# Patient Record
Sex: Male | Born: 1950 | Race: White | Hispanic: No | Marital: Married | Smoking: Former smoker
Health system: Southern US, Community
[De-identification: ages and names within clinical notes are randomized; demographics above are authoritative.]

## PROBLEM LIST (undated history)

## (undated) DIAGNOSIS — M109 Gout, unspecified: Secondary | ICD-10-CM

## (undated) DIAGNOSIS — M1990 Unspecified osteoarthritis, unspecified site: Secondary | ICD-10-CM

## (undated) DIAGNOSIS — E782 Mixed hyperlipidemia: Secondary | ICD-10-CM

## (undated) DIAGNOSIS — Z860100 Personal history of colon polyps, unspecified: Secondary | ICD-10-CM

## (undated) DIAGNOSIS — E119 Type 2 diabetes mellitus without complications: Secondary | ICD-10-CM

## (undated) DIAGNOSIS — J189 Pneumonia, unspecified organism: Secondary | ICD-10-CM

## (undated) DIAGNOSIS — E669 Obesity, unspecified: Secondary | ICD-10-CM

## (undated) DIAGNOSIS — J449 Chronic obstructive pulmonary disease, unspecified: Secondary | ICD-10-CM

## (undated) DIAGNOSIS — Z8616 Personal history of COVID-19: Secondary | ICD-10-CM

## (undated) DIAGNOSIS — K8020 Calculus of gallbladder without cholecystitis without obstruction: Secondary | ICD-10-CM

## (undated) DIAGNOSIS — N401 Enlarged prostate with lower urinary tract symptoms: Secondary | ICD-10-CM

## (undated) DIAGNOSIS — E785 Hyperlipidemia, unspecified: Secondary | ICD-10-CM

## (undated) DIAGNOSIS — M1A9XX0 Chronic gout, unspecified, without tophus (tophi): Secondary | ICD-10-CM

## (undated) DIAGNOSIS — N471 Phimosis: Secondary | ICD-10-CM

## (undated) DIAGNOSIS — C801 Malignant (primary) neoplasm, unspecified: Secondary | ICD-10-CM

## (undated) DIAGNOSIS — I10 Essential (primary) hypertension: Secondary | ICD-10-CM

## (undated) HISTORY — DX: Obesity, unspecified: E66.9

## (undated) HISTORY — PX: CHOLECYSTECTOMY: SHX55

## (undated) HISTORY — DX: Hyperlipidemia, unspecified: E78.5

## (undated) HISTORY — DX: Calculus of gallbladder without cholecystitis without obstruction: K80.20

## (undated) HISTORY — DX: Pneumonia, unspecified organism: J18.9

## (undated) HISTORY — PX: LEFT HEART CATH: SHX5946

## (undated) HISTORY — DX: Unspecified osteoarthritis, unspecified site: M19.90

## (undated) HISTORY — PX: TONSILLECTOMY: SUR1361

---

## 2006-12-19 HISTORY — PX: LAPAROSCOPIC CHOLECYSTECTOMY: SUR755

## 2013-09-09 ENCOUNTER — Ambulatory Visit (INDEPENDENT_AMBULATORY_CARE_PROVIDER_SITE_OTHER): Payer: PRIVATE HEALTH INSURANCE | Admitting: Family Medicine

## 2013-09-09 ENCOUNTER — Ambulatory Visit (INDEPENDENT_AMBULATORY_CARE_PROVIDER_SITE_OTHER): Payer: PRIVATE HEALTH INSURANCE

## 2013-09-09 VITALS — BP 130/82 | HR 97 | Temp 98.2°F | Ht 70.0 in | Wt 211.0 lb

## 2013-09-09 DIAGNOSIS — R5381 Other malaise: Secondary | ICD-10-CM

## 2013-09-09 DIAGNOSIS — R5383 Other fatigue: Secondary | ICD-10-CM

## 2013-09-09 DIAGNOSIS — R0989 Other specified symptoms and signs involving the circulatory and respiratory systems: Secondary | ICD-10-CM

## 2013-09-09 DIAGNOSIS — R059 Cough, unspecified: Secondary | ICD-10-CM

## 2013-09-09 DIAGNOSIS — R05 Cough: Secondary | ICD-10-CM

## 2013-09-09 DIAGNOSIS — I10 Essential (primary) hypertension: Secondary | ICD-10-CM

## 2013-09-09 LAB — POCT CBC
Granulocyte percent: 65.8 %G (ref 37–80)
HCT, POC: 43.8 % (ref 43.5–53.7)
Hemoglobin: 15 g/dL (ref 14.1–18.1)
Lymph, poc: 3.3 (ref 0.6–3.4)
MCH, POC: 32.1 pg — AB (ref 27–31.2)
MCHC: 34.2 g/dL (ref 31.8–35.4)
MCV: 93.9 fL (ref 80–97)
MPV: 7.9 fL (ref 0–99.8)
POC Granulocyte: 6.8 (ref 2–6.9)
POC LYMPH PERCENT: 31.7 %L (ref 10–50)
Platelet Count, POC: 244 10*3/uL (ref 142–424)
RBC: 4.7 M/uL (ref 4.69–6.13)
RDW, POC: 12.4 %
WBC: 10.3 10*3/uL — AB (ref 4.6–10.2)

## 2013-09-09 MED ORDER — LOSARTAN POTASSIUM 100 MG PO TABS: 100.0000 mg | ORAL_TABLET | Freq: Every day | ORAL | Status: DC

## 2013-09-09 MED ORDER — LEVALBUTEROL HCL 1.25 MG/0.5ML IN NEBU: 1.2500 mg | INHALATION_SOLUTION | Freq: Once | RESPIRATORY_TRACT | Status: DC

## 2013-09-09 MED ORDER — LEVALBUTEROL HCL 0.63 MG/3ML IN NEBU
0.6300 mg | INHALATION_SOLUTION | Freq: Once | RESPIRATORY_TRACT | Status: DC
  Administered 2013-09-09: 0.63 mg via RESPIRATORY_TRACT

## 2013-09-09 MED ORDER — LEVOFLOXACIN 500 MG PO TABS: 500.0000 mg | ORAL_TABLET | Freq: Every day | ORAL | Status: DC

## 2013-09-09 MED ORDER — ALBUTEROL SULFATE HFA 108 (90 BASE) MCG/ACT IN AERS: 2.0000 | INHALATION_SPRAY | Freq: Four times a day (QID) | RESPIRATORY_TRACT | Status: DC | PRN

## 2013-09-09 MED ORDER — PREDNISONE 10 MG PO TABS: ORAL_TABLET | ORAL | Status: DC

## 2013-09-09 NOTE — Progress Notes (Signed)
  Subjective:    Patient ID: Juan Merritt, male    DOB: Dec 14, 1951, 62 y.o.   MRN: [id number]  HPI  This 62 y.o. male presents for evaluation of shortness of breath and persistent cough. He has hx of having pneumonia and is worried that it may have returned.  He is a 1 ppd smoker. He states he gets SOB walking to the mailbox.  Review of Systems    No chest pain, SOB, HA, dizziness, vision change, N/V, diarrhea, constipation, dysuria, urinary urgency or frequency, myalgias, arthralgias or rash.  Objective:   Physical Exam  Vital signs noted  Well developed well nourished male.  HEENT - Head atraumatic Normocephalic                Eyes - PERRLA, Conjuctiva - clear Sclera- Clear EOMI                Ears - EAC's Wnl TM's Wnl Gross Hearing WNL                Nose - Nares patent                 Throat - oropharanx wnl Respiratory - Lungs exp wheezes scattered bilateral Cardiac - RRR S1 and S2 without murmur GI - Abdomen soft Nontender and bowel sounds active x 4 Extremities - No edema. Neuro - Grossly intact.  CXR - Hyperinflation with no infiltrates  Prelimnary reading by Angeline Slim    Assessment & Plan:  Chest congestion - Plan: POCT CBC, DG Chest 2 View, levalbuterol (XOPENEX) nebulizer solution 1.25 mg, levofloxacin (LEVAQUIN) 500 MG tablet, predniSONE (DELTASONE) 10 MG tablet, albuterol (PROVENTIL HFA;VENTOLIN HFA) 108 (90 BASE) MCG/ACT inhaler, levalbuterol (XOPENEX) nebulizer solution 0.63 mg.     Cough - Plan: POCT CBC, DG Chest 2 View, levalbuterol (XOPENEX) nebulizer solution 1.25 mg, levofloxacin (LEVAQUIN) 500 MG tablet, predniSONE (DELTASONE) 10 MG tablet, levalbuterol (XOPENEX) nebulizer solution 0.63 mg.  Take cough med from home.  Bronchitis - Instructed to DC smoking. Discussed he may have COPD and needs to use albuterol MDI 2 puff q 4-6 hour prn and Tudorza one puff bid #2 samples given.  Rx steroid taper and follow up in 2 weeks for PFT or sooner if  needed.  Fatigue - Plan: POCT CBC, DG Chest 2 View, levalbuterol (XOPENEX) nebulizer solution 1.25 mg, predniSONE (DELTASONE) 10 MG tablet  Essential hypertension, benign - Plan: losartan (COZAAR) 100 MG tablet  Deatra Canter FNP

## 2013-09-09 NOTE — Patient Instructions (Signed)

## 2013-10-23 ENCOUNTER — Ambulatory Visit: Payer: Self-pay | Admitting: Family Medicine

## 2013-10-23 ENCOUNTER — Ambulatory Visit: Payer: Self-pay

## 2013-10-23 VITALS — BP 144/89 | HR 95 | Temp 96.8°F | Ht 70.0 in | Wt 232.0 lb

## 2013-10-23 DIAGNOSIS — M79662 Pain in left lower leg: Secondary | ICD-10-CM

## 2013-10-23 DIAGNOSIS — M25562 Pain in left knee: Secondary | ICD-10-CM

## 2013-10-23 DIAGNOSIS — M79609 Pain in unspecified limb: Secondary | ICD-10-CM

## 2013-10-23 DIAGNOSIS — M25569 Pain in unspecified knee: Secondary | ICD-10-CM

## 2013-10-23 LAB — POCT CBC
Granulocyte percent: 61.6 %G (ref 37–80)
HCT, POC: 44.1 % (ref 43.5–53.7)
Lymph, poc: 2.7 (ref 0.6–3.4)
MCHC: 33.9 g/dL (ref 31.8–35.4)
POC Granulocyte: 5.4 (ref 2–6.9)
Platelet Count, POC: 258 10*3/uL (ref 142–424)
RDW, POC: 12.8 %
WBC: 8.7 10*3/uL (ref 4.6–10.2)

## 2013-10-23 MED ORDER — PREDNISONE 50 MG PO TABS: ORAL_TABLET | ORAL | Status: DC

## 2013-10-23 NOTE — Progress Notes (Signed)
  Subjective:    Patient ID: Juan Merritt, male    DOB: 1951-06-09, 62 y.o.   MRN: [id number]  HPI Pt presents today with chief complaint of knee pain  Pt reports progressive onset of L sided knee pain over last 2-3 days.  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  Also 1 PPD smoker  Pain mainly in anterior knee, but has progressed to involve posterior knee with some secondary calf swelling.  No fevers or chills. No new sexual partners.  No prior hx/o gout per pt.    Review of Systems  All other systems reviewed and are negative.       Objective:   Physical Exam  Constitutional: He appears well-developed and well-nourished.  HENT:  Head: Normocephalic and atraumatic.  Eyes: Pupils are equal, round, and reactive to light.  Neck: Normal range of motion. Neck supple.  Cardiovascular: Normal rate and regular rhythm.   Pulmonary/Chest: Effort normal.  Abdominal: Soft.  Musculoskeletal:       Legs: Predominant anteromedial knee effusion with TTP  No erythema Decreased ROM 2/2 pain  + popliteal TTP  Calves assymetric.    Neurological: He is alert.  Skin: Skin is warm.   [facility] reading (PRIMARY) by  Dr. Alvester Morin  Preliminary read with no acute fracture or dislocation, ? Effusion anteriorly                                   Lab Results  Component Value Date   WBC 8.7 10/23/2013   HGB 15.0 10/23/2013   HCT 44.1 10/23/2013   MCV 95.3 10/23/2013          Assessment & Plan:  Knee pain, left - Plan: DG Knee 1-2 Views Left, POCT CBC, Uric acid, predniSONE (DELTASONE) 50 MG tablet  Calf pain, left - Plan: US Venous Img Lower Unilateral Left  DDx fairly broad,  including gout, mensical injury, baker's cyst, DVT, infection.  Initially attempted diagnostic and therapeutic knee aspiration.  Pt repeatedly refused.  No leukocytosis on CBC today. Afebrile. No marked erythema on exam.  Swelling is primarily in anterior knee compartment.  Wells Score of 2,  though clinical exam more consistent with intra-articular process.  Will send for stat LE ultrasound.  Check uric acid level.  WIll place on course of prednisone for acute treatment.  Discussed with pt at length to go to ER if sxs worsen.  Pt expressed understanding.  Follow up pending imaging and blood work.

## 2013-10-24 LAB — URIC ACID: Uric Acid: 7.8 mg/dL (ref 3.7–8.6)

## 2013-10-31 ENCOUNTER — Telehealth: Payer: Self-pay | Admitting: Family Medicine

## 2013-10-31 NOTE — Telephone Encounter (Signed)
Spoke with patient again and he will f/u in our office if his symptoms persist or worsen.

## 2013-10-31 NOTE — Telephone Encounter (Signed)
[place] to address and route to pool A

## 2013-10-31 NOTE — Telephone Encounter (Signed)
Discussed with Dr. Alvester Morin.  Patient's uric acid level was elevated above 6 but this alone does not indicate an acute gout flare. Based on the normal values provided by the lab his level was was within normal limits.  At visit patient was not interested in having his knee aspirated. He also no-showed for the lower extremity ultrasound that was ordered.  Dr. Alvester Morin would like for him to see an orthopedist at this point. His knee swelling has decreased but not completely resolved. The pain is not constant but comes and goes. He does not want to see an orthopedist and wants to be treated for gout.  I offered him a follow-up appointment and he stated that he couldn't come back in due to his work scheduled.

## 2013-11-02 ENCOUNTER — Emergency Department (HOSPITAL_COMMUNITY)
Admission: EM | Admit: 2013-11-02 | Discharge: 2013-11-02 | Disposition: A | Discharge status: Home / Self Care | Payer: PRIVATE HEALTH INSURANCE | Attending: Emergency Medicine | Admitting: Emergency Medicine

## 2013-11-02 ENCOUNTER — Encounter (HOSPITAL_COMMUNITY): Payer: Self-pay | Admitting: Emergency Medicine

## 2013-11-02 DIAGNOSIS — F17200 Nicotine dependence, unspecified, uncomplicated: Secondary | ICD-10-CM | POA: Insufficient documentation

## 2013-11-02 DIAGNOSIS — I10 Essential (primary) hypertension: Secondary | ICD-10-CM | POA: Insufficient documentation

## 2013-11-02 DIAGNOSIS — Z79899 Other long term (current) drug therapy: Secondary | ICD-10-CM | POA: Insufficient documentation

## 2013-11-02 DIAGNOSIS — M25469 Effusion, unspecified knee: Secondary | ICD-10-CM | POA: Insufficient documentation

## 2013-11-02 DIAGNOSIS — M25569 Pain in unspecified knee: Secondary | ICD-10-CM | POA: Insufficient documentation

## 2013-11-02 DIAGNOSIS — M25562 Pain in left knee: Secondary | ICD-10-CM

## 2013-11-02 HISTORY — DX: Essential (primary) hypertension: I10

## 2013-11-02 HISTORY — DX: Gout, unspecified: M10.9

## 2013-11-02 MED ORDER — INDOMETHACIN 50 MG PO CAPS: 50.0000 mg | ORAL_CAPSULE | Freq: Three times a day (TID) | ORAL | Status: DC | PRN

## 2013-11-02 NOTE — ED Notes (Signed)
Pt states he is having a gout flare up in L knee. States PCP gave him dose pack of prednisone which he completed with relief of swelling but "no one gave me any gout medication." Pt reports no pain at rest but when he walks or applies pressure to LLE he has severe pain.

## 2013-11-02 NOTE — ED Provider Notes (Signed)
Medical screening examination/treatment/procedure(s) were performed by non-physician practitioner and as supervising physician I was immediately available for consultation/collaboration.  EKG Interpretation   None         Anneth Brunell S Adin Laker, MD 11/02/13 2048 

## 2013-11-02 NOTE — ED Provider Notes (Signed)
CSN: [id number]     Arrival date & time 11/02/13  1122 History  This chart was scribed for non-physician practitioner Santiago Glad, PA-C working with Junius Argyle, MD by Leone Payor, ED Scribe. This patient was seen in room TR09C/TR09C and the patient's care was started at 1122.    Chief Complaint  Patient presents with  . Gout    The history is provided by the patient. No language interpreter was used.    HPI Comments: Juan Merritt is a 62 y.o. male who presents to the Emergency Department complaining of a new episode of left knee pain and swelling that began a couple weeks ago. Pt states he was seen by PCP 10 days ago who prescribed him a 5 day course of steroids. Pt states he finished the medication with mild improvement but he continues to have pain in the left knee. He states pain is worse when initially standing and bearing weight. He denies having a history of gout. Pt states his PCP reported elevated uric acid levels which is why he was diagnosed with gout. However, pt states he was not given any medication specifically for gout. He denies numbness or weakness of the left lower extremity.     Past Medical History  Diagnosis Date  . Hypertension   . Gout    Past Surgical History  Procedure Laterality Date  . Cholecystectomy    . Tonsillectomy     History reviewed. No pertinent family history. History  Substance Use Topics  . Smoking status: Current Every Day Smoker    Types: Cigarettes  . Smokeless tobacco: Not on file  . Alcohol Use: Yes    Review of Systems A complete 10 system review of systems was obtained and all systems are negative except as noted in the HPI and PMH.   Allergies  Review of patient's allergies indicates no known allergies.  Home Medications   Current Outpatient Rx  Name  Route  Sig  Dispense  Refill  . amLODipine (NORVASC) 10 MG tablet   Oral   Take 10 mg by mouth daily.         Marland Kitchen losartan (COZAAR) 100 MG tablet   Oral  Take 100 mg by mouth daily.          BP 132/83  Pulse 99  Temp(Src) 97.5 F (36.4 C) (Oral)  Resp 20  SpO2 99% Physical Exam  Nursing note and vitals reviewed. Constitutional: He is oriented to person, place, and time. He appears well-developed and well-nourished.  HENT:  Head: Normocephalic and atraumatic.  Mouth/Throat: Oropharynx is clear and moist.  Eyes: EOM are normal. Pupils are equal, round, and reactive to light.  Neck: Normal range of motion. Neck supple.  Cardiovascular: Normal rate, regular rhythm and normal heart sounds.   Pulses:      Dorsalis pedis pulses are 2+ on the right side, and 2+ on the left side.  Pulmonary/Chest: Effort normal and breath sounds normal. He has no wheezes.  Musculoskeletal: Normal range of motion.  Good ROM of the left knee but with pain. Mild swelling to the medial aspect of the left knee. No erythema or warmth.   Neurological: He is alert and oriented to person, place, and time. No sensory deficit.  Skin: Skin is warm and dry.  Psychiatric: He has a normal mood and affect. His behavior is normal.    ED Course  Procedures   DIAGNOSTIC STUDIES: Oxygen Saturation is 99% on RA, normal by my interpretation.  COORDINATION OF CARE: 12:26 PM Discussed treatment plan with pt at bedside and pt agreed to plan.   Labs Review Labs Reviewed - No data to display Imaging Review No results found.  EKG Interpretation   None       MDM  No diagnosis found. Patient presenting with pain of his left knee.  Patient afebrile.  Full ROM.  No evidence of septic joint.  Patient reports that he has been seen by his PCP for this same pain and was diagnosed with Gout.  Patient completed a course of steroids.  Patient discharged home with Indomethicin.  Patient instructed to follow up with PCP.  Return precautions given.  I personally performed the services described in this documentation, which was scribed in my presence. The recorded information  has been reviewed and is accurate.   Santiago Glad, PA-C 11/02/13 [id number]

## 2013-11-11 ENCOUNTER — Ambulatory Visit (INDEPENDENT_AMBULATORY_CARE_PROVIDER_SITE_OTHER): Payer: PRIVATE HEALTH INSURANCE | Admitting: Family Medicine

## 2013-11-11 ENCOUNTER — Encounter: Payer: Self-pay | Admitting: Family Medicine

## 2013-11-11 VITALS — BP 127/76 | HR 86 | Temp 97.2°F | Ht 70.0 in | Wt 235.0 lb

## 2013-11-11 DIAGNOSIS — M109 Gout, unspecified: Secondary | ICD-10-CM

## 2013-11-11 MED ORDER — ALLOPURINOL 100 MG PO TABS: 100.0000 mg | ORAL_TABLET | Freq: Every day | ORAL | Status: DC

## 2013-11-11 MED ORDER — INDOMETHACIN 50 MG PO CAPS: 50.0000 mg | ORAL_CAPSULE | Freq: Three times a day (TID) | ORAL | Status: DC | PRN

## 2013-11-11 NOTE — Progress Notes (Signed)
  Subjective:    Patient ID: Juan Merritt, male    DOB: Feb 16, 1951, 62 y.o.   MRN: [id number]  HPI This 62 y.o. male presents for evaluation of left knee pain and gout flare.  He has Hx of gout and presented about a week ago and was tx'd with prednisone which helped And then it returned so he went to [facility] ED and was rx'd indomethacin tid prn and it worked Well to [employer] the pain in his left knee.  He does admit to having a diet high in purines and  He has hx of hypertension.  He states he was told his uric acid was elevated.   Review of Systems C/o left knee pain.   No chest pain, SOB, HA, dizziness, vision change, N/V, diarrhea, constipation, dysuria, urinary urgency or frequency, myalgias, arthralgias or rash.  Objective:   Physical Exam  Vital signs noted  Well developed well nourished male.  HEENT - Head atraumatic Normocephalic                Eyes - PERRLA, Conjuctiva - clear Sclera- Clear EOMI                Ears - EAC's Wnl TM's Wnl Gross Hearing.                Throat - oropharanx wnl Respiratory - Lungs CTA bilateral Cardiac - RRR S1 and S2 without murmur MS - Swollen and tender left knee     Assessment & Plan:  Gout - Plan: indomethacin (INDOCIN) 50 MG capsule, allopurinol (ZYLOPRIM) 100 MG tablet Start allopurinol, eat low purine diet, take indocin tid for next 10 days and then back down to Bid and then follow up in one month for recheck of uric acid and cmp, cbc.  Deatra Canter FNP

## 2013-11-11 NOTE — Patient Instructions (Signed)

## 2013-12-16 ENCOUNTER — Encounter: Payer: Self-pay | Admitting: Family Medicine

## 2013-12-16 ENCOUNTER — Encounter (INDEPENDENT_AMBULATORY_CARE_PROVIDER_SITE_OTHER): Payer: Self-pay

## 2013-12-16 ENCOUNTER — Ambulatory Visit (INDEPENDENT_AMBULATORY_CARE_PROVIDER_SITE_OTHER): Payer: PRIVATE HEALTH INSURANCE | Admitting: Family Medicine

## 2013-12-16 VITALS — BP 138/85 | HR 90 | Temp 97.1°F | Ht 70.0 in | Wt 241.0 lb

## 2013-12-16 DIAGNOSIS — M109 Gout, unspecified: Secondary | ICD-10-CM

## 2013-12-16 NOTE — Patient Instructions (Signed)

## 2013-12-16 NOTE — Progress Notes (Signed)
   Subjective:    Patient ID: SUMMER PARTHASARATHY, male    DOB: 03-09-1951, 62 y.o.   MRN: [id number]  HPI This 62 y.o. male presents for evaluation of left ankle swelling and follow up on gout. He was tx'd with allopurinol and indocin and has been doing well but is having left ankle swelling.   Review of Systems No chest pain, SOB, HA, dizziness, vision change, N/V, diarrhea, constipation, dysuria, urinary urgency or frequency, myalgias, arthralgias or rash.     Objective:   Physical Exam Vital signs noted  Well developed well nourished male.  HEENT - Head atraumatic Normocephalic                Eyes - PERRLA, Conjuctiva - clear Sclera- Clear EOMI                Ears - EAC's Wnl TM's Wnl Gross Hearing WNL                Nose - Nares patent                 Throat - oropharanx wnl Respiratory - Lungs CTA bilateral Cardiac - RRR S1 and S2 without murmur GI - Abdomen soft Nontender and bowel sounds active x 4 Extremities - No edema. Neuro - Grossly intact. MS - Left ankle swollen and no tenderness.      Assessment & Plan:  Gout Continue current regimen and follow up in one months for uric acid.  Edema - Discussed is probably from gout attack in left knee causing dependant edema.  Deatra Canter FNP

## 2014-01-17 ENCOUNTER — Other Ambulatory Visit (INDEPENDENT_AMBULATORY_CARE_PROVIDER_SITE_OTHER): Payer: PRIVATE HEALTH INSURANCE

## 2014-01-17 DIAGNOSIS — M109 Gout, unspecified: Secondary | ICD-10-CM

## 2014-01-17 NOTE — Progress Notes (Signed)
Patient came in for labs only.

## 2014-01-18 LAB — URIC ACID: URIC ACID: 5.4 mg/dL (ref 3.7–8.6)

## 2014-01-18 LAB — SPECIMEN STATUS REPORT

## 2014-06-02 ENCOUNTER — Telehealth: Payer: Self-pay | Admitting: Family Medicine

## 2014-06-02 ENCOUNTER — Ambulatory Visit (INDEPENDENT_AMBULATORY_CARE_PROVIDER_SITE_OTHER): Payer: PRIVATE HEALTH INSURANCE

## 2014-06-02 ENCOUNTER — Encounter: Payer: Self-pay | Admitting: Family

## 2014-06-02 ENCOUNTER — Ambulatory Visit (INDEPENDENT_AMBULATORY_CARE_PROVIDER_SITE_OTHER): Payer: PRIVATE HEALTH INSURANCE | Admitting: Family

## 2014-06-02 VITALS — BP 126/80 | HR 102 | Temp 99.0°F | Ht 70.0 in | Wt 233.2 lb

## 2014-06-02 DIAGNOSIS — R059 Cough, unspecified: Secondary | ICD-10-CM

## 2014-06-02 DIAGNOSIS — R0602 Shortness of breath: Secondary | ICD-10-CM

## 2014-06-02 DIAGNOSIS — R05 Cough: Secondary | ICD-10-CM

## 2014-06-02 DIAGNOSIS — J209 Acute bronchitis, unspecified: Secondary | ICD-10-CM

## 2014-06-02 MED ORDER — ALBUTEROL SULFATE HFA 108 (90 BASE) MCG/ACT IN AERS: 2.0000 | INHALATION_SPRAY | Freq: Four times a day (QID) | RESPIRATORY_TRACT | Status: DC | PRN

## 2014-06-02 MED ORDER — GUAIFENESIN-DM 100-10 MG/5ML PO SYRP: 5.0000 mL | ORAL_SOLUTION | ORAL | Status: DC | PRN

## 2014-06-02 MED ORDER — AMOXICILLIN-POT CLAVULANATE 875-125 MG PO TABS: 1.0000 | ORAL_TABLET | Freq: Two times a day (BID) | ORAL | Status: DC

## 2014-06-02 NOTE — Patient Instructions (Addendum)
Acute Bronchitis Bronchitis is inflammation of the airways that extend from the windpipe into the lungs (bronchi). The inflammation often causes mucus to develop. This leads to a cough, which is the most common symptom of bronchitis.  In acute bronchitis, the condition usually develops suddenly and goes away over time, usually in a couple weeks. Smoking, allergies, and asthma can make bronchitis worse. Repeated episodes of bronchitis may cause further lung problems.  CAUSES Acute bronchitis is most often caused by the same virus that causes a cold. The virus can spread from person to person (contagious).  SIGNS AND SYMPTOMS   Cough.   Fever.   Coughing up mucus.   Body aches.   Chest congestion.   Chills.   Shortness of breath.   Sore throat.  DIAGNOSIS  Acute bronchitis is usually diagnosed through a physical exam. Tests, such as chest X-rays, are sometimes done to rule out other conditions.  TREATMENT  Acute bronchitis usually goes away in a couple weeks. Often times, no medical treatment is necessary. Medicines are sometimes given for relief of fever or cough. Antibiotics are usually not needed but may be prescribed in certain situations. In some cases, an inhaler may be recommended to help reduce shortness of breath and control the cough. A cool mist vaporizer may also be used to help thin bronchial secretions and make it easier to clear the chest.  HOME CARE INSTRUCTIONS  Get plenty of rest.   Drink enough fluids to keep your urine clear or pale yellow (unless you have a medical condition that requires fluid restriction). Increasing fluids may help thin your secretions and will prevent dehydration.   Only take over-the-counter or prescription medicines as directed by your health care provider.   Avoid smoking and secondhand smoke. Exposure to cigarette smoke or irritating chemicals will make bronchitis worse. If you are a smoker, consider using nicotine gum or skin  patches to help control withdrawal symptoms. Quitting smoking will help your lungs heal faster.   Reduce the chances of another bout of acute bronchitis by washing your hands frequently, avoiding people with cold symptoms, and trying not to touch your hands to your mouth, nose, or eyes.   Follow up with your health care provider as directed.  SEEK MEDICAL CARE IF: Your symptoms do not improve after 1 week of treatment.  SEEK IMMEDIATE MEDICAL CARE IF:  You develop an increased fever or chills.   You have chest pain.   You have severe shortness of breath.  You have bloody sputum.   You develop dehydration.  You develop fainting.  You develop repeated vomiting.  You develop a severe headache. MAKE SURE YOU:   Understand these instructions.  Will watch your condition.  Will get help right away if you are not doing well or get worse. Document Released: 01/12/2005 Document Revised: 08/07/2013 Document Reviewed: 05/28/2013 [facility] Patient Information 2014 [place], [place].  - Take meds as prescribed - Use a cool mist humidifier  -Use saline nose sprays frequently -Saline irrigations of the nose can be very helpful if done frequently.  * 4X daily for 1 week*  * Use of a nettie pot can be helpful with this. Follow directions with this* -Force fluids -For any cough or congestion  Use plain Mucinex- regular strength or max strength is fine   * Children- consult with Pharmacist for dosing -For fever or aces or pains- take tylenol or ibuprofen appropriate for age and weight.  * for fevers greater than 101 orally you  may alternate ibuprofen and tylenol every  3 hours. -Throat lozenges if help   Evelina Dun, FNP

## 2014-06-02 NOTE — Progress Notes (Signed)
Subjective:    Patient ID: Juan Merritt, male    DOB: [birth year], 63 y.o.   MRN: [id number]  Cough This is a new problem. The current episode started in the past 7 days. The problem has been gradually worsening. The problem occurs every few hours. The cough is productive of sputum. Associated symptoms include shortness of breath. Pertinent negatives include no ear congestion, nasal congestion, postnasal drip or wheezing. The symptoms are aggravated by lying down. Risk factors for lung disease include smoking/tobacco exposure. He has tried nothing for the symptoms. There is no history of asthma or COPD.      Review of Systems  HENT: Negative for postnasal drip.   Respiratory: Positive for cough and shortness of breath. Negative for wheezing.   All other systems reviewed and are negative.      Objective:   Physical Exam  Vitals reviewed. Constitutional: He is oriented to person, place, and time. He appears well-developed and well-nourished. No distress.  HENT:  Head: Normocephalic.  Right Ear: External ear normal.  Left Ear: External ear normal.  Mouth/Throat: Oropharynx is clear and moist.  Eyes: Pupils are equal, round, and reactive to light. Right eye exhibits no discharge. Left eye exhibits no discharge.  Neck: Normal range of motion. Neck supple. No thyromegaly present.  Cardiovascular: Normal rate, regular rhythm, normal heart sounds and intact distal pulses.   No murmur heard. Pulmonary/Chest: Effort normal. No respiratory distress. He has wheezes.  Abdominal: Soft. Bowel sounds are normal. He exhibits no distension. There is no tenderness.  Musculoskeletal: Normal range of motion. He exhibits no edema and no tenderness.  Neurological: He is alert and oriented to person, place, and time. He has normal reflexes. No cranial nerve deficit.  Skin: Skin is warm and dry. No rash noted. No erythema.  Psychiatric: He has a normal mood and affect. His behavior is normal. Judgment  and thought content normal.    BP 126/80  Pulse 102  Temp(Src) 99 F (37.2 C) (Oral)  Ht 5\' 10"  (1.778 m)  Wt 233 lb 3.2 oz (105.779 kg)  BMI 33.46 kg/m2  X-ray- Bronchial changes- Possible COPD Preliminary reading by Evelina Dun, FNP [facility]      Assessment & Plan:  1. SOB (shortness of breath) - DG Chest 2 View; Future - albuterol (PROVENTIL HFA;VENTOLIN HFA) 108 (90 BASE) MCG/ACT inhaler; Inhale 2 puffs into the lungs every 6 (six) hours as needed for wheezing or shortness of breath.  Dispense: 1 Inhaler; Refill: 2  2. Cough - guaiFENesin-dextromethorphan (ROBITUSSIN DM) 100-10 MG/5ML syrup; Take 5 mLs by mouth every 4 (four) hours as needed for cough.  Dispense: 118 mL; Refill: 0  3. Acute bronchitis - Take meds as prescribed - Use a cool mist humidifier  -Force fluids -For any cough or congestion  Use plain Mucinex- regular strength or max strength is fine -For fever or aces or pains- take tylenol or ibuprofen appropriate for age and weight.  * for fevers greater than 101 orally you may alternate ibuprofen and tylenol every  3 hours. -Throat lozenges if help - albuterol (PROVENTIL HFA;VENTOLIN HFA) 108 (90 BASE) MCG/ACT inhaler; Inhale 2 puffs into the lungs every 6 (six) hours as needed for wheezing or shortness of breath.  Dispense: 1 Inhaler; Refill: 2 - amoxicillin-clavulanate (AUGMENTIN) 875-125 MG per tablet; Take 1 tablet by mouth 2 (two) times daily.  Dispense: 14 tablet; Refill: 0 - guaiFENesin-dextromethorphan (ROBITUSSIN DM) 100-10 MG/5ML syrup; Take 5 mLs by mouth every 4 (  four) hours as needed for cough.  Dispense: 118 mL; Refill: 0

## 2014-06-03 ENCOUNTER — Other Ambulatory Visit: Payer: Self-pay | Admitting: Family

## 2014-06-16 ENCOUNTER — Encounter: Payer: Self-pay | Admitting: Family Medicine

## 2014-06-16 ENCOUNTER — Ambulatory Visit (INDEPENDENT_AMBULATORY_CARE_PROVIDER_SITE_OTHER): Payer: PRIVATE HEALTH INSURANCE | Admitting: Family Medicine

## 2014-06-16 VITALS — BP 123/74 | HR 92 | Temp 98.4°F | Wt 235.8 lb

## 2014-06-16 DIAGNOSIS — J209 Acute bronchitis, unspecified: Secondary | ICD-10-CM

## 2014-06-16 DIAGNOSIS — R059 Cough, unspecified: Secondary | ICD-10-CM

## 2014-06-16 DIAGNOSIS — R05 Cough: Secondary | ICD-10-CM

## 2014-06-16 DIAGNOSIS — R0989 Other specified symptoms and signs involving the circulatory and respiratory systems: Secondary | ICD-10-CM

## 2014-06-16 MED ORDER — AMOXICILLIN-POT CLAVULANATE 875-125 MG PO TABS: 1.0000 | ORAL_TABLET | Freq: Two times a day (BID) | ORAL | Status: DC

## 2014-06-16 MED ORDER — PREDNISONE 10 MG PO TABS: ORAL_TABLET | ORAL | Status: DC

## 2014-06-16 NOTE — Progress Notes (Signed)
   Subjective:    Patient ID: Juan Merritt, male    DOB: 10-06-1951, 63 y.o.   MRN: [id number]  HPI This 63 y.o. male presents for evaluation of bronchitis and COPD exacerbation.  He still has a cough But is feeling some better.   Review of Systems C/o cough No chest pain, SOB, HA, dizziness, vision change, N/V, diarrhea, constipation, dysuria, urinary urgency or frequency, myalgias, arthralgias or rash.     Objective:   Physical Exam Vital signs noted  Well developed well nourished male.  HEENT - Head atraumatic Normocephalic                Eyes - PERRLA, Conjuctiva - clear Sclera- Clear EOMI                Ears - EAC's Wnl TM's Wnl Gross Hearing WNL                Nose - Nares patent                 Throat - oropharanx wnl Respiratory - Lungs with expiratory wheezes Cardiac - RRR S1 and S2 without murmur GI - Abdomen soft Nontender and bowel sounds active x 4        Assessment & Plan:  Chest congestion - Plan: predniSONE (DELTASONE) 10 MG tablet  Cough - Plan: predniSONE (DELTASONE) 10 MG tablet  Acute bronchitis, unspecified organism - Plan: amoxicillin-clavulanate (AUGMENTIN) 875-125 MG per tablet  Push po fluids, rest, tylenol and motrin otc prn as directed for fever, arthralgias, and myalgias.  Follow up prn if sx's continue or persist.  Lysbeth Penner FNP

## 2014-10-07 ENCOUNTER — Ambulatory Visit (INDEPENDENT_AMBULATORY_CARE_PROVIDER_SITE_OTHER): Payer: PRIVATE HEALTH INSURANCE

## 2014-10-07 ENCOUNTER — Ambulatory Visit (INDEPENDENT_AMBULATORY_CARE_PROVIDER_SITE_OTHER): Payer: PRIVATE HEALTH INSURANCE | Admitting: Family Medicine

## 2014-10-07 ENCOUNTER — Encounter: Payer: Self-pay | Admitting: Family Medicine

## 2014-10-07 VITALS — BP 132/80 | HR 100 | Temp 97.5°F | Ht 70.0 in | Wt 236.0 lb

## 2014-10-07 DIAGNOSIS — R0602 Shortness of breath: Secondary | ICD-10-CM

## 2014-10-07 DIAGNOSIS — R0989 Other specified symptoms and signs involving the circulatory and respiratory systems: Secondary | ICD-10-CM

## 2014-10-07 DIAGNOSIS — R5383 Other fatigue: Secondary | ICD-10-CM

## 2014-10-07 DIAGNOSIS — J441 Chronic obstructive pulmonary disease with (acute) exacerbation: Secondary | ICD-10-CM

## 2014-10-07 DIAGNOSIS — R059 Cough, unspecified: Secondary | ICD-10-CM

## 2014-10-07 DIAGNOSIS — R05 Cough: Secondary | ICD-10-CM

## 2014-10-07 LAB — POCT CBC
Granulocyte percent: 74.6 %G (ref 37–80)
HCT, POC: 43.9 % (ref 43.5–53.7)
Hemoglobin: 14.4 g/dL (ref 14.1–18.1)
Lymph, poc: 2.1 (ref 0.6–3.4)
MCH, POC: 31.3 pg — AB (ref 27–31.2)
MCHC: 32.8 g/dL (ref 31.8–35.4)
MCV: 95.5 fL (ref 80–97)
MPV: 7.4 fL (ref 0–99.8)
POC Granulocyte: 7.2 — AB (ref 2–6.9)
POC LYMPH PERCENT: 21.6 %L (ref 10–50)
Platelet Count, POC: 224 10*3/uL (ref 142–424)
RBC: 4.6 M/uL — AB (ref 4.69–6.13)
RDW, POC: 13.1 %
WBC: 9.7 10*3/uL (ref 4.6–10.2)

## 2014-10-07 MED ORDER — LEVALBUTEROL HCL 1.25 MG/3ML IN NEBU
1.2500 mg | INHALATION_SOLUTION | Freq: Once | RESPIRATORY_TRACT | Status: AC
  Administered 2014-10-07: 1.25 mg via RESPIRATORY_TRACT

## 2014-10-07 MED ORDER — FLUTICASONE-SALMETEROL 250-50 MCG/DOSE IN AEPB: 1.0000 | INHALATION_SPRAY | Freq: Two times a day (BID) | RESPIRATORY_TRACT | Status: DC

## 2014-10-07 MED ORDER — METHYLPREDNISOLONE ACETATE 80 MG/ML IJ SUSP
80.0000 mg | Freq: Once | INTRAMUSCULAR | Status: AC
  Administered 2014-10-07: 80 mg via INTRAMUSCULAR

## 2014-10-07 MED ORDER — ALBUTEROL SULFATE (2.5 MG/3ML) 0.083% IN NEBU: 2.5000 mg | INHALATION_SOLUTION | Freq: Four times a day (QID) | RESPIRATORY_TRACT | Status: DC | PRN

## 2014-10-07 MED ORDER — PREDNISONE 10 MG PO TABS: ORAL_TABLET | ORAL | Status: DC

## 2014-10-07 MED ORDER — AMOXICILLIN 875 MG PO TABS: 875.0000 mg | ORAL_TABLET | Freq: Two times a day (BID) | ORAL | Status: DC

## 2014-10-07 NOTE — Progress Notes (Signed)
   Subjective:    Patient ID: Juan Merritt, male    DOB: 1951-05-12, 63 y.o.   MRN: [id number]  HPI This 63 y.o. male presents for evaluation of cough and SOB.  He has hx of long time tobacco abuse and he has hx of copd.  He is using SABA MDI and it is not helping.  He is having SOB walking across room and is having a lot of night time coughing.   Review of Systems C/o cough and uri sx's No chest pain, SOB, HA, dizziness, vision change, N/V, diarrhea, constipation, dysuria, urinary urgency or frequency, myalgias, arthralgias or rash.     Objective:   Physical Exam Vital signs noted  Well developed well nourished male.  HEENT - Head atraumatic Normocephalic                Eyes - PERRLA, Conjuctiva - clear Sclera- Clear EOMI                Ears - EAC's Wnl TM's Wnl Gross Hearing WNL                Nose - Nares patent                 Throat - oropharanx wnl Respiratory - Lungs with diminished breath sounds bilateral. Cardiac - RRR S1 and S2 without murmur GI - Abdomen soft Nontender and bowel sounds active x 4 Extremities - No edema. Neuro - Grossly intact.  CXR - No infiltrates     Assessment & Plan:  SOB (shortness of breath) - Plan: POCT CBC, DG Chest 2 View, levalbuterol (XOPENEX) nebulizer solution 1.25 mg  Cough - Plan: POCT CBC, DG Chest 2 View, predniSONE (DELTASONE) 10 MG tablet, levalbuterol (XOPENEX) nebulizer solution 1.25 mg  Chest congestion - Plan: POCT CBC, DG Chest 2 View, predniSONE (DELTASONE) 10 MG tablet, levalbuterol (XOPENEX) nebulizer solution 1.25 mg  Fatigue - Plan: levalbuterol (XOPENEX) nebulizer solution 1.25 mg  COPD exacerbation - Plan: levalbuterol (XOPENEX) nebulizer solution 1.25 mg, albuterol (PROVENTIL) (2.5 MG/3ML) 0.083% nebulizer solution, methylPREDNISolone acetate (DEPO-MEDROL) injection 80 mg  Tobacco abuse - Discussed that needs to quit smoking  Lysbeth Penner FNP

## 2014-10-10 ENCOUNTER — Telehealth: Payer: Self-pay | Admitting: Family Medicine

## 2014-10-10 NOTE — Telephone Encounter (Signed)
Appointment given for 10/30 with [facility]

## 2014-10-17 ENCOUNTER — Encounter: Payer: Self-pay | Admitting: Family Medicine

## 2014-10-17 ENCOUNTER — Ambulatory Visit (INDEPENDENT_AMBULATORY_CARE_PROVIDER_SITE_OTHER): Payer: PRIVATE HEALTH INSURANCE | Admitting: Family Medicine

## 2014-10-17 VITALS — BP 108/74 | HR 85 | Temp 97.5°F | Ht 70.0 in | Wt 234.0 lb

## 2014-10-17 DIAGNOSIS — J441 Chronic obstructive pulmonary disease with (acute) exacerbation: Secondary | ICD-10-CM

## 2014-10-17 NOTE — Progress Notes (Signed)
   Subjective:    Patient ID: Juan Merritt, male    DOB: 1951-02-06, 63 y.o.   MRN: [id number]  HPI Patient is here for follow up on copd exacerbation.  He has finished prednisone taper and is feeling a lot better.  He is sleeping better at night  Review of Systems  Constitutional: Negative for fever.  HENT: Negative for ear pain.   Eyes: Negative for discharge.  Respiratory: Negative for cough.   Cardiovascular: Negative for chest pain.  Gastrointestinal: Negative for abdominal distention.  Endocrine: Negative for polyuria.  Genitourinary: Negative for difficulty urinating.  Musculoskeletal: Negative for gait problem and neck pain.  Skin: Negative for color change and rash.  Neurological: Negative for speech difficulty and headaches.  Psychiatric/Behavioral: Negative for agitation.       Objective:    BP 108/74  Pulse 85  Temp(Src) 97.5 F (36.4 C) (Oral)  Ht 5\' 10"  (1.778 m)  Wt 234 lb (106.142 kg)  BMI 33.58 kg/m2 Physical Exam  Constitutional: He is oriented to person, place, and time. He appears well-developed and well-nourished.  HENT:  Head: Normocephalic and atraumatic.  Mouth/Throat: Oropharynx is clear and moist.  Eyes: Pupils are equal, round, and reactive to light.  Neck: Normal range of motion. Neck supple.  Cardiovascular: Normal rate and regular rhythm.   No murmur heard. Pulmonary/Chest: Effort normal and breath sounds normal.  Abdominal: Soft. Bowel sounds are normal. There is no tenderness.  Neurological: He is alert and oriented to person, place, and time.  Skin: Skin is warm and dry.  Psychiatric: He has a normal mood and affect.          Assessment & Plan:     ICD-9-CM ICD-10-CM  1. COPD exacerbation 491.21 J44.1  Resolved. Continue mucinex and follow up prn  Return if symptoms worsen or fail to improve.  Lysbeth Penner FNP

## 2014-11-26 ENCOUNTER — Other Ambulatory Visit: Payer: Self-pay | Admitting: Family Medicine

## 2014-12-06 ENCOUNTER — Other Ambulatory Visit: Payer: Self-pay | Admitting: Family Medicine

## 2014-12-17 ENCOUNTER — Other Ambulatory Visit: Payer: Self-pay | Admitting: Family

## 2014-12-30 ENCOUNTER — Other Ambulatory Visit: Payer: Self-pay | Admitting: Family Medicine

## 2015-01-21 ENCOUNTER — Ambulatory Visit (INDEPENDENT_AMBULATORY_CARE_PROVIDER_SITE_OTHER): Payer: PRIVATE HEALTH INSURANCE | Admitting: Family Medicine

## 2015-01-21 ENCOUNTER — Encounter: Payer: Self-pay | Admitting: Family Medicine

## 2015-01-21 VITALS — BP 118/83 | HR 92 | Temp 97.3°F | Ht 70.0 in | Wt 247.0 lb

## 2015-01-21 DIAGNOSIS — J206 Acute bronchitis due to rhinovirus: Secondary | ICD-10-CM

## 2015-01-21 DIAGNOSIS — R0989 Other specified symptoms and signs involving the circulatory and respiratory systems: Secondary | ICD-10-CM

## 2015-01-21 DIAGNOSIS — L989 Disorder of the skin and subcutaneous tissue, unspecified: Secondary | ICD-10-CM

## 2015-01-21 DIAGNOSIS — R059 Cough, unspecified: Secondary | ICD-10-CM

## 2015-01-21 DIAGNOSIS — R05 Cough: Secondary | ICD-10-CM

## 2015-01-21 MED ORDER — AMOXICILLIN 875 MG PO TABS: 875.0000 mg | ORAL_TABLET | Freq: Two times a day (BID) | ORAL | Status: DC

## 2015-01-21 MED ORDER — PREDNISONE 10 MG PO TABS: ORAL_TABLET | ORAL | Status: DC

## 2015-01-21 NOTE — Progress Notes (Signed)
   Subjective:    Patient ID: Juan Merritt, male    DOB: October 22, 1951, 64 y.o.   MRN: [id number]  HPI Patient is here for c/o skin lesion right cheek and URI c/o.  Review of Systems  Constitutional: Negative for fever.  HENT: Negative for ear pain.   Eyes: Negative for discharge.  Respiratory: Negative for cough.   Cardiovascular: Negative for chest pain.  Gastrointestinal: Negative for abdominal distention.  Endocrine: Negative for polyuria.  Genitourinary: Negative for difficulty urinating.  Musculoskeletal: Negative for gait problem and neck pain.  Skin: Negative for color change and rash.  Neurological: Negative for speech difficulty and headaches.  Psychiatric/Behavioral: Negative for agitation.       Objective:    BP 118/83 mmHg  Pulse 92  Temp(Src) 97.3 F (36.3 C) (Oral)  Ht 5\' 10"  (1.778 m)  Wt 247 lb (112.038 kg)  BMI 35.44 kg/m2 Physical Exam  Constitutional: He is oriented to person, place, and time. He appears well-developed and well-nourished.  HENT:  Head: Normocephalic and atraumatic.  Mouth/Throat: Oropharynx is clear and moist.  Eyes: Pupils are equal, round, and reactive to light.  Neck: Normal range of motion. Neck supple.  Cardiovascular: Normal rate and regular rhythm.   No murmur heard. Pulmonary/Chest: Effort normal and breath sounds normal.  Abdominal: Soft. Bowel sounds are normal. There is no tenderness.  Neurological: He is alert and oriented to person, place, and time.  Skin: Skin is warm and dry.  commedone right cheek with erythmatous border/ cyst w/o fluctuance  Psychiatric: He has a normal mood and affect.          Assessment & Plan:     ICD-9-CM ICD-10-CM   1. Chest congestion 786.9 R09.89 predniSONE (DELTASONE) 10 MG tablet     amoxicillin (AMOXIL) 875 MG tablet  2. Cough 786.2 R05 predniSONE (DELTASONE) 10 MG tablet     amoxicillin (AMOXIL) 875 MG tablet  3. Acute bronchitis due to Rhinovirus 466.0 J20.6 predniSONE  (DELTASONE) 10 MG tablet   079.3  amoxicillin (AMOXIL) 875 MG tablet  4. Skin lesion of face 709.9 L98.9 Ambulatory referral to Dermatology     No Follow-up on file.  Lysbeth Penner FNP

## 2015-02-02 ENCOUNTER — Other Ambulatory Visit: Payer: Self-pay | Admitting: Family Medicine

## 2015-02-09 ENCOUNTER — Other Ambulatory Visit: Payer: Self-pay | Admitting: Family Medicine

## 2015-03-04 ENCOUNTER — Telehealth: Payer: Self-pay | Admitting: Family Medicine

## 2015-03-04 NOTE — Telephone Encounter (Signed)
Stp's wife advised we don't have any openings Friday afternoon, advised to call Friday morning at 7:45 to see if we had any cancellations.

## 2015-03-06 ENCOUNTER — Encounter: Payer: Self-pay | Admitting: Physician Assistant

## 2015-03-06 ENCOUNTER — Ambulatory Visit (INDEPENDENT_AMBULATORY_CARE_PROVIDER_SITE_OTHER): Payer: PRIVATE HEALTH INSURANCE

## 2015-03-06 ENCOUNTER — Ambulatory Visit (INDEPENDENT_AMBULATORY_CARE_PROVIDER_SITE_OTHER): Payer: PRIVATE HEALTH INSURANCE | Admitting: Physician Assistant

## 2015-03-06 VITALS — BP 125/77 | HR 97 | Temp 97.8°F | Ht 70.0 in | Wt 248.0 lb

## 2015-03-06 DIAGNOSIS — R0602 Shortness of breath: Secondary | ICD-10-CM

## 2015-03-06 DIAGNOSIS — J441 Chronic obstructive pulmonary disease with (acute) exacerbation: Secondary | ICD-10-CM | POA: Diagnosis not present

## 2015-03-06 MED ORDER — IPRATROPIUM-ALBUTEROL 0.5-2.5 (3) MG/3ML IN SOLN
3.0000 mL | Freq: Once | RESPIRATORY_TRACT | Status: AC
  Administered 2015-03-06: 3 mL via RESPIRATORY_TRACT

## 2015-03-06 MED ORDER — PREDNISONE (PAK) 10 MG PO TABS: ORAL_TABLET | ORAL | Status: DC

## 2015-03-06 MED ORDER — AZITHROMYCIN 250 MG PO TABS: ORAL_TABLET | ORAL | Status: DC

## 2015-03-06 NOTE — Progress Notes (Signed)
   Subjective:    Patient ID: Juan Merritt, male    DOB: [birth year], 64 y.o.   MRN: [id number]  HPI 64 y/o obese male with comorbid COPD presents with c/o SOB and cough x several days. Has not stopped smoking, however states that he has decreased the amount he smokes daily and is using an electric cigarette. Has been using his nebulizer in the morning and Proventil BID with no relief.     Review of Systems  Constitutional: Negative.   HENT: Negative.   Respiratory: Positive for cough, shortness of breath and wheezing.   Cardiovascular: Negative.        Objective:   Physical Exam  Cardiovascular: Normal rate.  Exam reveals no gallop and no friction rub.   No murmur heard. Occasional PAC heard on PE and verified on ECG performed in clinic  Pulmonary/Chest: Effort normal. No respiratory distress. He has wheezes (expiratory, bilateral upper and lower lobes). He has no rales. He exhibits no tenderness.  Musculoskeletal: He exhibits no edema.          Assessment & Plan:  1. COPD exacerbation  -Nebulizer treatment given during visit. O2 saturation did not increase from 90%, however patient was not compliant with treatment and removed the adapter from his mouth several times.   -Sterapred Uni-pack as directed   - Azithromycin dose pack as directed  - increase Proventil to every 4 hours as directed  - continue to use nebulizer in the morning  - Stop smoking  -Go to ED immediately if s/s worsen over the weekend.   - Referral to Pulmonologist due to frequent exacerbations

## 2015-03-17 ENCOUNTER — Telehealth: Payer: Self-pay | Admitting: Physician Assistant

## 2015-03-17 ENCOUNTER — Other Ambulatory Visit: Payer: Self-pay | Admitting: Physician Assistant

## 2015-03-17 DIAGNOSIS — R0602 Shortness of breath: Secondary | ICD-10-CM

## 2015-03-17 DIAGNOSIS — J441 Chronic obstructive pulmonary disease with (acute) exacerbation: Secondary | ICD-10-CM

## 2015-03-17 MED ORDER — AZITHROMYCIN 250 MG PO TABS: ORAL_TABLET | ORAL | Status: DC

## 2015-03-17 NOTE — Telephone Encounter (Signed)
RX sent to pharm

## 2015-03-17 NOTE — Telephone Encounter (Signed)
RX sent

## 2015-03-23 ENCOUNTER — Ambulatory Visit (INDEPENDENT_AMBULATORY_CARE_PROVIDER_SITE_OTHER): Payer: PRIVATE HEALTH INSURANCE

## 2015-03-23 ENCOUNTER — Ambulatory Visit (INDEPENDENT_AMBULATORY_CARE_PROVIDER_SITE_OTHER): Payer: PRIVATE HEALTH INSURANCE | Admitting: Family Medicine

## 2015-03-23 ENCOUNTER — Encounter: Payer: Self-pay | Admitting: Family Medicine

## 2015-03-23 ENCOUNTER — Inpatient Hospital Stay (HOSPITAL_COMMUNITY)
Admission: AD | Admit: 2015-03-23 | Discharge: 2015-03-26 | DRG: 190 | Disposition: A | Discharge status: Home / Self Care | Payer: PRIVATE HEALTH INSURANCE | Source: Ambulatory Visit | Attending: Internal Medicine | Admitting: Internal Medicine

## 2015-03-23 ENCOUNTER — Encounter (HOSPITAL_COMMUNITY): Payer: Self-pay | Admitting: Internal Medicine

## 2015-03-23 VITALS — BP 155/92 | HR 87 | Temp 97.1°F | Ht 70.0 in | Wt 246.0 lb

## 2015-03-23 DIAGNOSIS — R0600 Dyspnea, unspecified: Secondary | ICD-10-CM | POA: Diagnosis present

## 2015-03-23 DIAGNOSIS — R059 Cough, unspecified: Secondary | ICD-10-CM

## 2015-03-23 DIAGNOSIS — M109 Gout, unspecified: Secondary | ICD-10-CM | POA: Diagnosis present

## 2015-03-23 DIAGNOSIS — R05 Cough: Secondary | ICD-10-CM

## 2015-03-23 DIAGNOSIS — J189 Pneumonia, unspecified organism: Secondary | ICD-10-CM | POA: Diagnosis not present

## 2015-03-23 DIAGNOSIS — R600 Localized edema: Secondary | ICD-10-CM | POA: Diagnosis present

## 2015-03-23 DIAGNOSIS — I10 Essential (primary) hypertension: Secondary | ICD-10-CM | POA: Diagnosis present

## 2015-03-23 DIAGNOSIS — E785 Hyperlipidemia, unspecified: Secondary | ICD-10-CM | POA: Diagnosis present

## 2015-03-23 DIAGNOSIS — J9601 Acute respiratory failure with hypoxia: Secondary | ICD-10-CM | POA: Diagnosis present

## 2015-03-23 DIAGNOSIS — Z720 Tobacco use: Secondary | ICD-10-CM | POA: Diagnosis not present

## 2015-03-23 DIAGNOSIS — F17210 Nicotine dependence, cigarettes, uncomplicated: Secondary | ICD-10-CM | POA: Diagnosis present

## 2015-03-23 DIAGNOSIS — J9600 Acute respiratory failure, unspecified whether with hypoxia or hypercapnia: Secondary | ICD-10-CM | POA: Diagnosis present

## 2015-03-23 DIAGNOSIS — J441 Chronic obstructive pulmonary disease with (acute) exacerbation: Principal | ICD-10-CM | POA: Diagnosis present

## 2015-03-23 DIAGNOSIS — R0602 Shortness of breath: Secondary | ICD-10-CM

## 2015-03-23 HISTORY — DX: Chronic obstructive pulmonary disease, unspecified: J44.9

## 2015-03-23 LAB — COMPREHENSIVE METABOLIC PANEL
ALK PHOS: 50 U/L (ref 39–117)
ALT: 43 U/L (ref 0–53)
AST: 26 U/L (ref 0–37)
Albumin: 4.3 g/dL (ref 3.5–5.2)
Anion gap: 9 (ref 5–15)
BUN: 15 mg/dL (ref 6–23)
CO2: 28 mmol/L (ref 19–32)
Calcium: 9.5 mg/dL (ref 8.4–10.5)
Chloride: 105 mmol/L (ref 96–112)
Creatinine, Ser: 0.88 mg/dL (ref 0.50–1.35)
GFR calc Af Amer: >90 mL/min (ref >90)
Glucose, Bld: 105 mg/dL — ABNORMAL HIGH (ref 70–99)
POTASSIUM: 4.5 mmol/L (ref 3.5–5.1)
Sodium: 142 mmol/L (ref 135–145)
Total Bilirubin: 0.5 mg/dL (ref 0.3–1.2)
Total Protein: 7.6 g/dL (ref 6.0–8.3)

## 2015-03-23 LAB — CBC WITH DIFFERENTIAL/PLATELET
BASOS PCT: 1 % (ref 0–1)
Basophils Absolute: 0 10*3/uL (ref 0.0–0.1)
Eosinophils Absolute: 1 10*3/uL — ABNORMAL HIGH (ref 0.0–0.7)
Eosinophils Relative: 13 % — ABNORMAL HIGH (ref 0–5)
HCT: 45.3 % (ref 39.0–52.0)
HEMOGLOBIN: 14.9 g/dL (ref 13.0–17.0)
Lymphocytes Relative: 31 % (ref 12–46)
Lymphs Abs: 2.5 10*3/uL (ref 0.7–4.0)
MCH: 32.4 pg (ref 26.0–34.0)
MCHC: 32.9 g/dL (ref 30.0–36.0)
MCV: 98.5 fL (ref 78.0–100.0)
Monocytes Absolute: 0.7 10*3/uL (ref 0.1–1.0)
Monocytes Relative: 9 % (ref 3–12)
NEUTROS ABS: 3.8 10*3/uL (ref 1.7–7.7)
NEUTROS PCT: 46 % (ref 43–77)
Platelets: 220 10*3/uL (ref 150–400)
RBC: 4.6 MIL/uL (ref 4.22–5.81)
RDW: 12.9 % (ref 11.5–15.5)
WBC: 7.9 10*3/uL (ref 4.0–10.5)

## 2015-03-23 LAB — TSH: TSH: 1.542 u[IU]/mL (ref 0.350–4.500)

## 2015-03-23 LAB — D-DIMER, QUANTITATIVE: D-Dimer, Quant: 0.31 ug/mL-FEU (ref 0.00–0.48)

## 2015-03-23 LAB — TROPONIN I: Troponin I: <0.03 ng/mL (ref <0.031)

## 2015-03-23 MED ORDER — SODIUM CHLORIDE 0.9 % IV SOLN
INTRAVENOUS | Status: AC
  Administered 2015-03-23: 23:00:00 via INTRAVENOUS

## 2015-03-23 MED ORDER — LEVOFLOXACIN IN D5W 500 MG/100ML IV SOLN
500.0000 mg | INTRAVENOUS | Status: DC
  Administered 2015-03-23 – 2015-03-25 (×3): 500 mg via INTRAVENOUS
  Filled 2015-03-23 (×3): qty 100

## 2015-03-23 MED ORDER — TIOTROPIUM BROMIDE MONOHYDRATE 18 MCG IN CAPS
18.0000 ug | ORAL_CAPSULE | Freq: Every day | RESPIRATORY_TRACT | Status: DC
  Administered 2015-03-24 – 2015-03-25 (×2): 18 ug via RESPIRATORY_TRACT
  Filled 2015-03-23: qty 5

## 2015-03-23 MED ORDER — ENOXAPARIN SODIUM 60 MG/0.6ML ~~LOC~~ SOLN
60.0000 mg | SUBCUTANEOUS | Status: DC
  Filled 2015-03-23 (×4): qty 0.6

## 2015-03-23 MED ORDER — METHYLPREDNISOLONE SODIUM SUCC 125 MG IJ SOLR
80.0000 mg | Freq: Three times a day (TID) | INTRAMUSCULAR | Status: DC
  Administered 2015-03-23 – 2015-03-26 (×8): 80 mg via INTRAVENOUS
  Filled 2015-03-23 (×12): qty 1.28

## 2015-03-23 MED ORDER — ACETAMINOPHEN 325 MG PO TABS: 650.0000 mg | ORAL_TABLET | Freq: Four times a day (QID) | ORAL | Status: DC | PRN

## 2015-03-23 MED ORDER — SODIUM CHLORIDE 0.9 % IJ SOLN
3.0000 mL | Freq: Two times a day (BID) | INTRAMUSCULAR | Status: DC
  Administered 2015-03-24 – 2015-03-25 (×3): 3 mL via INTRAVENOUS

## 2015-03-23 MED ORDER — AMLODIPINE BESYLATE 10 MG PO TABS: 10.0000 mg | ORAL_TABLET | Freq: Every day | ORAL | Status: DC

## 2015-03-23 MED ORDER — ALBUTEROL SULFATE (2.5 MG/3ML) 0.083% IN NEBU
2.5000 mg | INHALATION_SOLUTION | Freq: Four times a day (QID) | RESPIRATORY_TRACT | Status: DC
  Administered 2015-03-23 – 2015-03-24 (×2): 2.5 mg via RESPIRATORY_TRACT
  Filled 2015-03-23 (×2): qty 3

## 2015-03-23 MED ORDER — NICOTINE 21 MG/24HR TD PT24
21.0000 mg | MEDICATED_PATCH | Freq: Every day | TRANSDERMAL | Status: DC
  Administered 2015-03-23 – 2015-03-26 (×4): 21 mg via TRANSDERMAL
  Filled 2015-03-23 (×4): qty 1

## 2015-03-23 MED ORDER — ACETAMINOPHEN 650 MG RE SUPP: 650.0000 mg | Freq: Four times a day (QID) | RECTAL | Status: DC | PRN

## 2015-03-23 MED ORDER — LOSARTAN POTASSIUM 100 MG PO TABS
100.0000 mg | ORAL_TABLET | Freq: Every day | ORAL | Status: DC
Start: 2015-03-23 — End: 2015-09-07

## 2015-03-23 MED ORDER — ALBUTEROL SULFATE (2.5 MG/3ML) 0.083% IN NEBU
2.5000 mg | INHALATION_SOLUTION | Freq: Four times a day (QID) | RESPIRATORY_TRACT | Status: DC | PRN
  Administered 2015-03-23: 2.5 mg via RESPIRATORY_TRACT
  Filled 2015-03-23: qty 3

## 2015-03-23 NOTE — Patient Instructions (Signed)
Because of the persistent congestion and a drop in his PCO2 despite taking antibiotics using an albuterol nebulizer, taking prednisone, we will arrange for him to have an admission and hopefully they can follow up with the pulmonologist that a visit was plan for this Friday.

## 2015-03-23 NOTE — H&P (Signed)
Juan Merritt is an 64 y.o. male.    [facility] (pcp)  Chief Complaint: dyspnea HPI: 64 yo male with hx of tobacco use, copd, apparently c/o dyspnea for the past few days.  Slight dry cough,  Denies fever, chills, cp, palp, n/v, diarrhea, brbpr, black stool. Pt was sent from [facility].  o2 sat 87% in office.  CXR Copd changes.  Pt will be admitted for Copd exacerbation.   Past Medical History  Diagnosis Date  . Hypertension   . Gout   . COPD (chronic obstructive pulmonary disease)     not on home o2    Past Surgical History  Procedure Laterality Date  . Cholecystectomy    . Tonsillectomy      Family History  Problem Relation Age of Onset  . COPD Mother   . Diabetes Father    Social History:  reports that he has been smoking Cigarettes.  He has a 20 pack-year smoking history. He does not have any smokeless tobacco history on file. He reports that he drinks about 6.0 oz of alcohol per week. He reports that he does not use illicit drugs.  Allergies: No Known Allergies  Medications Prior to Admission  Medication Sig Dispense Refill  . albuterol (PROVENTIL) (2.5 MG/3ML) 0.083% nebulizer solution Take 3 mLs (2.5 mg total) by nebulization every 6 (six) hours as needed for wheezing or shortness of breath. 150 mL 1  . allopurinol (ZYLOPRIM) 100 MG tablet TAKE 1 TABLET (100 MG TOTAL) BY MOUTH DAILY. 30 tablet 2  . amLODipine (NORVASC) 10 MG tablet Take 1 tablet (10 mg total) by mouth daily. 90 tablet 1  . Fluticasone-Salmeterol (ADVAIR DISKUS) 250-50 MCG/DOSE AEPB Inhale 1 puff into the lungs 2 (two) times daily. 60 each 11  . indomethacin (INDOCIN) 50 MG capsule Take 1 capsule (50 mg total) by mouth 3 (three) times daily as needed. 90 capsule 3  . losartan (COZAAR) 100 MG tablet Take 1 tablet (100 mg total) by mouth daily. 90 tablet 1  . PROVENTIL HFA 108 (90 BASE) MCG/ACT inhaler INHALE 2 PUFFS INTO THE LUNGS EVERY 6 (SIX) HOURS AS NEEDED FOR WHEEZING OR SHORTNESS  OF BREATH. 6.7 each 2    No results found for this or any previous visit (from the past 48 hour(s)). Dg Chest 2 View  03/23/2015   CLINICAL DATA:  Cough, congestion, and shortness of breath, current everyday smoker.  EXAM: CHEST  2 VIEW  COMPARISON:  PA and lateral chest x-ray of March 06, 2015  FINDINGS: The lungs are hyperinflated with hemidiaphragm flattening. The heart and pulmonary vascularity are normal. The mediastinum is normal in width. There is no pleural effusion. The bony thorax is unremarkable.  IMPRESSION: COPD.  There is no active cardiopulmonary disease.   Electronically Signed   By: David  Martinique   On: 03/23/2015 15:36    Review of Systems  Constitutional: Negative.   HENT: Negative.   Eyes: Negative.   Respiratory: Positive for cough, shortness of breath and wheezing. Negative for hemoptysis and sputum production.   Cardiovascular: Negative.   Gastrointestinal: Negative.   Genitourinary: Negative.   Musculoskeletal: Negative.   Skin: Negative.   Neurological: Negative.   Endo/Heme/Allergies: Negative.   Psychiatric/Behavioral: Negative.     Blood pressure 115/80, pulse 106, temperature 98.4 F (36.9 C), temperature source Oral, resp. rate 22, SpO2 90 %. Physical Exam  Constitutional: He is oriented to person, place, and time. He appears well-developed and well-nourished.  HENT:  Head: Normocephalic and atraumatic.  Mouth/Throat: No oropharyngeal exudate.  Eyes: Conjunctivae and EOM are normal. Pupils are equal, round, and reactive to light. No scleral icterus.  Neck: Normal range of motion. Neck supple. No JVD present. No tracheal deviation present. No thyromegaly present.  Cardiovascular: Normal rate and regular rhythm.  Exam reveals no gallop and no friction rub.   No murmur heard. Respiratory: Effort normal. He has wheezes. He has no rales. He exhibits no tenderness.  GI: Soft. Bowel sounds are normal. He exhibits no distension. There is no tenderness. There is  no rebound and no guarding.  Musculoskeletal: Normal range of motion. He exhibits no edema or tenderness.  Lymphadenopathy:    He has no cervical adenopathy.  Neurological: He is alert and oriented to person, place, and time. He has normal reflexes. He displays normal reflexes. No cranial nerve deficit. He exhibits normal muscle tone. Coordination normal.  Skin: Skin is warm and dry. No rash noted. No erythema. No pallor.  Psychiatric: He has a normal mood and affect. His behavior is normal. Judgment and thought content normal.     Assessment/Plan Dyspnea  likely secondary to copd exacerbation Solumedrol 80mg  iv q8h levaquin 500mg  iv qday spiriva 1puff qday Albuterol 0.083% 1 neb po q6h and q6h prn  Tachycardia Check trop i q6hx 3 Check tsh Check d dimer  If d dimer is positive CTA chest if creatinine wnl Consider echo if persistent  Hypertension Cont to monitor   DVT prophylaxis  Scd, and lovenox Cherelle Midkiff 03/23/2015, 8:30 PM

## 2015-03-23 NOTE — Progress Notes (Signed)
Subjective:    Patient ID: Juan Merritt, male    DOB: 1951/03/28, 64 y.o.   MRN: [id number]  HPI Patient here today for cough, congestion, COPD, and SOB. He is accompanied today by his wife. He has already completed a Zpak and this started about 1.5 weeks ago. The patient may have gotten a little better with a Z-Pak and a prednisone that has gotten much worse since that time. He's been using the albuterol nebulizer and inhaler at home. He did take a course of prednisone.       There are no active problems to display for this patient.  Outpatient Encounter Prescriptions as of 03/23/2015  Medication Sig  . albuterol (PROVENTIL) (2.5 MG/3ML) 0.083% nebulizer solution Take 3 mLs (2.5 mg total) by nebulization every 6 (six) hours as needed for wheezing or shortness of breath.  . allopurinol (ZYLOPRIM) 100 MG tablet TAKE 1 TABLET (100 MG TOTAL) BY MOUTH DAILY.  Marland Kitchen amLODipine (NORVASC) 10 MG tablet Take 1 tablet (10 mg total) by mouth daily.  . Fluticasone-Salmeterol (ADVAIR DISKUS) 250-50 MCG/DOSE AEPB Inhale 1 puff into the lungs 2 (two) times daily.  . indomethacin (INDOCIN) 50 MG capsule Take 1 capsule (50 mg total) by mouth 3 (three) times daily as needed.  Marland Kitchen losartan (COZAAR) 100 MG tablet Take 1 tablet (100 mg total) by mouth daily.  Marland Kitchen PROVENTIL HFA 108 (90 BASE) MCG/ACT inhaler INHALE 2 PUFFS INTO THE LUNGS EVERY 6 (SIX) HOURS AS NEEDED FOR WHEEZING OR SHORTNESS OF BREATH.  . [DISCONTINUED] amLODipine (NORVASC) 10 MG tablet TAKE 1 TABLET BY MOUTH DAILY  . [DISCONTINUED] losartan (COZAAR) 100 MG tablet TAKE 1 TABLET BY MOUTH DAILY  . [DISCONTINUED] predniSONE (STERAPRED UNI-PAK) 10 MG tablet Take as directed.  . [DISCONTINUED] allopurinol (ZYLOPRIM) 100 MG tablet TAKE 1 TABLET (100 MG TOTAL) BY MOUTH DAILY.  . [DISCONTINUED] azithromycin (ZITHROMAX) 250 MG tablet Take 2 tablets on day 1 , then 1 tab on days 2-5     Review of Systems  Constitutional: Negative.   HENT: Positive for  congestion.   Eyes: Negative.   Respiratory: Positive for cough, shortness of breath and wheezing.   Cardiovascular: Negative.   Gastrointestinal: Negative.   Endocrine: Negative.   Genitourinary: Negative.   Musculoskeletal: Negative.   Skin: Negative.   Allergic/Immunologic: Negative.   Neurological: Negative.   Hematological: Negative.   Psychiatric/Behavioral: Negative.        Objective:   Physical Exam  Constitutional: He is oriented to person, place, and time. He appears well-developed and well-nourished. He appears distressed.  HENT:  Head: Normocephalic and atraumatic.  Right Ear: External ear normal.  Left Ear: External ear normal.  Nose: Nose normal.  Mouth/Throat: Oropharynx is clear and moist. No oropharyngeal exudate.  Eyes: Conjunctivae and EOM are normal. Pupils are equal, round, and reactive to light. Right eye exhibits no discharge. Left eye exhibits no discharge.  Neck: Normal range of motion. Neck supple. No thyromegaly present.  Cardiovascular: Normal rate, regular rhythm and normal heart sounds.   No murmur heard. Pulmonary/Chest: Effort normal. He has wheezes. He has rales.  The patient has rales and rhonchi bilaterally with a very tight and congested cough  Musculoskeletal: Normal range of motion. He exhibits edema.  Lymphadenopathy:    He has cervical adenopathy.  Neurological: He is alert and oriented to person, place, and time.  Skin: Skin is warm and dry. No rash noted.  Psychiatric: He has a normal mood and affect. His behavior  is normal. Judgment and thought content normal.  Vitals reviewed.  BP 155/92 mmHg  Pulse 87  Temp(Src) 97.1 F (36.2 C) (Oral)  Ht '5\' 10"'  (1.778 m)  Wt 246 lb (111.585 kg)  BMI 35.30 kg/m2  SpO2 87%  [facility] reading (PRIMARY) by  Dr. Wonda Horner acute disease process apparent  Pulse ox 87%                                       Assessment & Plan:  1. SOB (shortness of breath) -Pulse ox is down to 87% today despite  using albuterol nebulizer and completing a course of antibiotics and prednisone. - BMP8+EGFR - CBC - DG Chest 2 View; Future  2. Cough -The patient continues to have cough and shortness of breath - BMP8+EGFR - CBC - DG Chest 2 View; Future  3. Pneumonia, unspecified laterality, unspecified part of lung -Because of his persistent cough and congestion and shortness of breath and we will arrange an admission through the hospitalist at [facility]. The patient does have an appointment with the pulmonologist this Friday.  Patient Instructions  Because of the persistent congestion and a drop in his PCO2 despite taking antibiotics using an albuterol nebulizer, taking prednisone, we will arrange for him to have an admission and hopefully they can follow up with the pulmonologist that a visit was plan for this Friday.   Arrie Senate MD

## 2015-03-24 DIAGNOSIS — J9601 Acute respiratory failure with hypoxia: Secondary | ICD-10-CM

## 2015-03-24 DIAGNOSIS — J9600 Acute respiratory failure, unspecified whether with hypoxia or hypercapnia: Secondary | ICD-10-CM | POA: Diagnosis present

## 2015-03-24 DIAGNOSIS — Z720 Tobacco use: Secondary | ICD-10-CM

## 2015-03-24 DIAGNOSIS — F17210 Nicotine dependence, cigarettes, uncomplicated: Secondary | ICD-10-CM | POA: Diagnosis present

## 2015-03-24 LAB — BMP8+EGFR
BUN/Creatinine Ratio: 16 (ref 10–22)
BUN: 15 mg/dL (ref 8–27)
CALCIUM: 9.2 mg/dL (ref 8.6–10.2)
CHLORIDE: 104 mmol/L (ref 97–108)
CO2: 27 mmol/L (ref 18–29)
CREATININE: 0.95 mg/dL (ref 0.76–1.27)
GFR calc Af Amer: 99 mL/min/{1.73_m2} (ref >59)
GFR calc non Af Amer: 86 mL/min/{1.73_m2} (ref >59)
Glucose: 106 mg/dL — ABNORMAL HIGH (ref 65–99)
POTASSIUM: 4.3 mmol/L (ref 3.5–5.2)
Sodium: 144 mmol/L (ref 134–144)

## 2015-03-24 LAB — CBC
HCT: 43.1 % (ref 37.5–51.0)
Hemoglobin: 14.4 g/dL (ref 12.6–17.7)
MCH: 31.7 pg (ref 26.6–33.0)
MCHC: 33.4 g/dL (ref 31.5–35.7)
MCV: 95 fL (ref 79–97)
Platelets: 216 10*3/uL (ref 150–379)
RBC: 4.54 x10E6/uL (ref 4.14–5.80)
RDW: 13.7 % (ref 12.3–15.4)
WBC: 8.2 10*3/uL (ref 3.4–10.8)

## 2015-03-24 LAB — TROPONIN I

## 2015-03-24 MED ORDER — IPRATROPIUM-ALBUTEROL 0.5-2.5 (3) MG/3ML IN SOLN
3.0000 mL | RESPIRATORY_TRACT | Status: DC
  Administered 2015-03-24 – 2015-03-26 (×13): 3 mL via RESPIRATORY_TRACT
  Filled 2015-03-24 (×14): qty 3

## 2015-03-24 NOTE — Progress Notes (Signed)
CARE MANAGEMENT NOTE 03/24/2015  Patient:  Juan Merritt, Juan Merritt   Account Number:  [id number]  Date Initiated:  03/24/2015  Documentation initiated by:  Edwyna Shell  Subjective/Objective Assessment:   64 yo male admitted with pna and COPD exacerbation from home     Action/Plan:   discharge planning   Anticipated DC Date:  03/26/2015   Anticipated DC Plan:  [place]  CM consult      Choice offered to / List presented to:             Status of service:  Completed, signed off Medicare Important Message given?   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 He follows with his PCP and  gets yearly check ups through his [place].

## 2015-03-24 NOTE — Progress Notes (Signed)
[facility] PROGRESS NOTE  JEMIAH ELLENBURG XYI:[id number] DOB: 1951-02-28 DOA: 03/23/2015 PCP: Sharion Balloon, FNP  Interim Summary Patient is a 64 year old woman with a past medical history of tobacco use, chronic second palmar disease, admitted to the medicine service on 03/23/2015 when he presented with complaints of worsening shortness of breath associate with cough. He was found have an O2 sat of 87% in the outpatient setting for which she was referred to the emergency department. Symptoms attributed to chronic obstructive pulmonary disease exacerbation as he was started on IV steroids and IV antibiotic therapy.  Assessment/Plan: 1. Chronic obstructive pulmonary disease exacerbation. -Patient presenting with complaints of cough and shortness of breath, having extensive wheezing on exam. -On my evaluation today continues to require 5 L. Mental oxygen via nasal cannula. -Will continue Solu-Medrol 80 mg IV every 8 hours, scheduled duo nebs and empiric IV antibody therapy with Levaquin. -Wean oxygen down as tolerated.  2.  Acute respiratory failure. -Evidence by an O2 sat of 87%, having new oxygen requirement, likely secondary to COPD exacerbation -Continue systemic steroids, antibiotic therapy, duo nebs.  3.  Suspected community acquire pneumonia/bacterial bronchitis -It is possible underlying infectious process precipitated COPD exacerbation -Continue empiric IV antibody therapy with Levaquin  4.  Tobacco abuse. -On nicotine patch  Code Status: Full code Family Communication: Family not present Disposition Plan: Anticipate discharge home when medically stable   Antibiotics:  Levaquin (started on 03/23/2015)  HPI/Subjective: Patient states feeling better, asking to go home however remains on 5 L supplemental oxygen. Tolerating by mouth intake  Objective: Filed Vitals:   03/24/15 1402  BP: 126/78  Pulse: 106  Temp: 98.4 F (36.9 C)  Resp: 21    Intake/Output  Summary (Last 24 hours) at 03/24/15 1716 Last data filed at 03/24/15 1326  Gross per 24 hour  Intake 826.25 ml  Output      0 ml  Net 826.25 ml   Filed Weights   03/23/15 2210  Weight: 113.626 kg (250 lb 8 oz)    Exam:   General:  Ill-appearing, awake, alert, sitting at bedside chair. Asking to go home.  Cardiovascular: Regular rate and rhythm normal S1-S2 no murmurs rubs or gallops  Respiratory: Diminished breath sounds bilaterally, patient having ongoing expiratory wheezes, prolonged expiratory phase, few crackles.  Abdomen: Soft nontender nondistended  Musculoskeletal: No edema  Data Reviewed: Basic Metabolic Panel:  Recent Labs Lab 03/23/15 1454 03/23/15 2118  NA 144 142  K 4.3 4.5  CL 104 105  CO2 27 28  GLUCOSE 106* 105*  BUN 15 15  CREATININE 0.95 0.88  CALCIUM 9.2 9.5   Liver Function Tests:  Recent Labs Lab 03/23/15 2118  AST 26  ALT 43  ALKPHOS 50  BILITOT 0.5  PROT 7.6  ALBUMIN 4.3   No results for input(s): LIPASE, AMYLASE in the last 168 hours. No results for input(s): AMMONIA in the last 168 hours. CBC:  Recent Labs Lab 03/23/15 1454 03/23/15 2118  WBC 8.2 7.9  NEUTROABS  --  3.8  HGB 14.4 14.9  HCT 43.1 45.3  MCV 95 98.5  PLT 216 220   Cardiac Enzymes:  Recent Labs Lab 03/23/15 2118 03/24/15 0230 03/24/15 0933  TROPONINI <0.03 <0.03 <0.03   BNP (last 3 results) No results for input(s): BNP in the last 8760 hours.  ProBNP (last 3 results) No results for input(s): PROBNP in the last 8760 hours.  CBG: No results for input(s): GLUCAP in the last 168 hours.  No results found for this or any previous visit (from the past 240 hour(s)).   Studies: Dg Chest 2 View  03/23/2015   CLINICAL DATA:  Cough, congestion, and shortness of breath, current everyday smoker.  EXAM: CHEST  2 VIEW  COMPARISON:  PA and lateral chest x-ray of March 06, 2015  FINDINGS: The lungs are hyperinflated with hemidiaphragm flattening. The heart and  pulmonary vascularity are normal. The mediastinum is normal in width. There is no pleural effusion. The bony thorax is unremarkable.  IMPRESSION: COPD.  There is no active cardiopulmonary disease.   Electronically Signed   By: David  Martinique   On: 03/23/2015 15:36    Scheduled Meds: . enoxaparin (LOVENOX) injection  60 mg Subcutaneous Q24H  . ipratropium-albuterol  3 mL Nebulization Q4H  . levofloxacin (LEVAQUIN) IV  500 mg Intravenous Q24H  . methylPREDNISolone (SOLU-MEDROL) injection  80 mg Intravenous 3 times per day  . nicotine  21 mg Transdermal Daily  . sodium chloride  3 mL Intravenous Q12H  . tiotropium  18 mcg Inhalation Daily   Continuous Infusions:   Active Problems:   COPD exacerbation    Time spent: 62 min    Kelvin Cellar  [facility] Pager [phone]. If 7PM-7AM, please contact night-coverage at [email or web address], password [facility] 03/24/2015, 5:16 PM  LOS: 1 day

## 2015-03-24 NOTE — Progress Notes (Signed)
UR complete 

## 2015-03-25 DIAGNOSIS — I10 Essential (primary) hypertension: Secondary | ICD-10-CM

## 2015-03-25 DIAGNOSIS — R600 Localized edema: Secondary | ICD-10-CM

## 2015-03-25 MED ORDER — LOSARTAN POTASSIUM 50 MG PO TABS
100.0000 mg | ORAL_TABLET | Freq: Every day | ORAL | Status: DC
  Administered 2015-03-26: 100 mg via ORAL
  Filled 2015-03-25 (×2): qty 2

## 2015-03-25 MED ORDER — PERFLUTREN LIPID MICROSPHERE
1.0000 mL | INTRAVENOUS | Status: AC | PRN
  Administered 2015-03-25: 3 mL via INTRAVENOUS
  Filled 2015-03-25: qty 10

## 2015-03-25 MED ORDER — ALLOPURINOL 100 MG PO TABS
100.0000 mg | ORAL_TABLET | Freq: Every day | ORAL | Status: DC
  Administered 2015-03-26: 100 mg via ORAL
  Filled 2015-03-25 (×2): qty 1

## 2015-03-25 MED ORDER — AMLODIPINE BESYLATE 10 MG PO TABS
10.0000 mg | ORAL_TABLET | Freq: Every day | ORAL | Status: DC
  Administered 2015-03-26: 10 mg via ORAL
  Filled 2015-03-25 (×2): qty 1

## 2015-03-25 NOTE — Progress Notes (Signed)
Echocardiogram 2D Echocardiogram has been performed.  Joelene Millin 03/25/2015, 3:39 PM

## 2015-03-25 NOTE — Progress Notes (Signed)
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 He was found to have an O2 sat of 87% in the outpatient setting for which he was referred to the emergency department and admitted for COPD exacerbation  Assessment/Plan:  Chronic obstructive pulmonary disease exacerbation. - Patient was told to have COPD 6 months ago. - Has ongoing 80-pack-year smoking history. Tobacco cessation counseled. - Patient has appointment to see a pulmonologist on 03/27/15-may have to be postponed - Treated with oxygen, bronchodilators, IV Solu-Medrol and levofloxacin. - Clinically improving but still requires oxygen. - Chest x-ray without pneumonia.   Acute respiratory failure with hypoxia. - Evidence by an O2 sat of 87%, having new oxygen requirement, likely secondary to COPD exacerbation - Continue management as above and titrate down oxygen to maintain saturation between 89-92 percent. - Discussed with patient and family and advised that he may well require home oxygen at discharge. - D dimer: neg  Tobacco abuse. - On nicotine patch - Cessation counseled.    Essential hypertension  - Controlled.  - We'll resume home amlodipine and Cozaar.    HLD - As per patient and spouse report. - However not on statins.  Code Status: Full code Family Communication: Discussed with patient's spouse who is an Therapist, sports at an [place] facility Disposition Plan: Possible DC home in the next 24-48 hours.   Antibiotics:  Levaquin (started on 03/23/2015)  HPI/Subjective: Denies dyspnea, cough or chest pain. Anxious to go  home.  Objective: Filed Vitals:   03/25/15 0512 03/25/15 0854 03/25/15 1241 03/25/15 1445  BP: 127/83   133/55  Pulse: 105   111  Temp: 97.9 F (36.6 C)   98.2 F (36.8 C)  TempSrc: Oral   Oral  Resp: 20   18  Weight: 114.2 kg (251 lb 12.3 oz)     SpO2: 95% 94% 94% 94%      Intake/Output Summary (Last 24 hours) at 03/25/15 1605 Last data filed at 03/24/15 1845  Gross per 24 hour  Intake    240 ml  Output      0 ml  Net    240 ml   Filed Weights   03/23/15 2210 03/25/15 0512  Weight: 113.626 kg (250 lb 8 oz) 114.2 kg (251 lb 12.3 oz)    Exam:   General:  pleasant middle-aged male sitting up comfortably in bed without distress.  Cardiovascular: Regular rate and rhythm normal S1-S2 no murmurs rubs or gallops. Telemetry: Mostly sinus tachycardia in the 100s. Occasional sinus tachycardia in the 120s-140s.   Respiratory: diminished/distant breath sounds bilaterally with occasional posterior medium pitched expiratory rhonchi. No crackles. No increased work of breathing. Able to speak in full sentences.   Abdomen: Soft nontender nondistended. Normal bowel sounds heard   Musculoskeletal: No edema  CNS: Alert and oriented. No focal deficits  Data Reviewed: Basic Metabolic Panel:  Recent Labs Lab 03/23/15 1454 03/23/15 2118  NA 144 142  K 4.3 4.5  CL 104 105  CO2 27 28  GLUCOSE 106* 105*  BUN 15 15  CREATININE 0.95 0.88  CALCIUM 9.2 9.5   Liver Function Tests:  Recent Labs Lab 03/23/15 2118  AST 26  ALT 43  ALKPHOS 50  BILITOT 0.5  PROT 7.6  ALBUMIN 4.3   No results for input(s): LIPASE, AMYLASE in the last 168 hours. No results for input(s): AMMONIA in the last 168 hours. CBC:  Recent Labs Lab 03/23/15 1454 03/23/15 2118  WBC 8.2 7.9  NEUTROABS  --  3.8  HGB 14.4 14.9  HCT 43.1 45.3  MCV 95 98.5  PLT 216 220   Cardiac Enzymes:  Recent Labs Lab 03/23/15 2118 03/24/15 0230 03/24/15 0933  TROPONINI <0.03 <0.03 <0.03   BNP (last 3  results) No results for input(s): BNP in the last 8760 hours.  ProBNP (last 3 results) No results for input(s): PROBNP in the last 8760 hours.  CBG: No results for input(s): GLUCAP in the last 168 hours.  No results found for this or any previous visit (from the past 240 hour(s)).   Studies: No results found.  Scheduled Meds: . enoxaparin (LOVENOX) injection  60 mg Subcutaneous Q24H  . ipratropium-albuterol  3 mL Nebulization Q4H  . levofloxacin (LEVAQUIN) IV  500 mg Intravenous Q24H  . methylPREDNISolone (SOLU-MEDROL) injection  80 mg Intravenous 3 times per day  . nicotine  21 mg Transdermal Daily  . sodium chloride  3 mL Intravenous Q12H  . tiotropium  18 mcg Inhalation Daily   Continuous Infusions:   Principal Problem:   COPD exacerbation Active Problems:   Acute respiratory failure   Tobacco abuse       HONGALGI,ANAND, MD, FACP, FHM. [facility] Pager [phone]  If 7PM-7AM, please contact night-coverage [email or web address] Password [email or web address] 03/25/2015, 4:18 PM    LOS: 2 days

## 2015-03-26 MED ORDER — LEVOFLOXACIN 500 MG PO TABS: 500.0000 mg | ORAL_TABLET | Freq: Every day | ORAL | Status: DC

## 2015-03-26 MED ORDER — PREDNISONE 50 MG PO TABS
50.0000 mg | ORAL_TABLET | Freq: Every day | ORAL | Status: DC
  Administered 2015-03-26: 50 mg via ORAL
  Filled 2015-03-26: qty 1

## 2015-03-26 MED ORDER — NICOTINE 21 MG/24HR TD PT24: 21.0000 mg | MEDICATED_PATCH | Freq: Every day | TRANSDERMAL | Status: DC

## 2015-03-26 MED ORDER — FUROSEMIDE 20 MG PO TABS: 20.0000 mg | ORAL_TABLET | Freq: Every day | ORAL | Status: DC | PRN

## 2015-03-26 MED ORDER — PREDNISONE 10 MG PO TABS: ORAL_TABLET | ORAL | Status: DC

## 2015-03-26 NOTE — Discharge Summary (Addendum)
Physician Discharge Summary  Juan Merritt DJT:[id number] DOB: 02-22-1951 DOA: 03/23/2015  PCP: Sharion Balloon, FNP  Admit date: 03/23/2015 Discharge date: 03/26/2015  Time spent: Less than 30 minutes  Recommendations for Outpatient Follow-up:  1. Evelina Dun, FNP/PCP on 03/31/15 at 2:25 PM with repeat labs (BMP). 2. Dr. Christinia Gully, Pulmonology on 03/27/15 at 11:45 AM.  Discharge Diagnoses:  Principal Problem:   COPD exacerbation Active Problems:   Acute respiratory failure   Tobacco abuse   Discharge Condition: Improved & Stable  Diet recommendation: Heart healthy diet.  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 He was found to have an O2 sat of 87% in the outpatient setting for which he was referred to the emergency department and admitted for COPD exacerbation  Hospital Course:   Chronic obstructive pulmonary disease exacerbation. - Patient was told to have COPD 6 months ago. - Has ongoing 80-pack-year smoking history. Tobacco cessation counseled. - Patient has appointment to see a pulmonologist on 03/27/15- advised to keep that appointment - Treated with oxygen, bronchodilators, IV Solu-Medrol and levofloxacin. - Clinically improved and desaturation evaluation completed and maintains oxygen saturation in the low 90s on room air at rest and with activity. - Chest x-ray without pneumonia. - DC home on prednisone taper and complete 7 days of oral levofloxacin. - Has pulmonology consultation appointment tomorrow at which time may consider starting  maintenance medications i.e. Advair/Symbicort.  Acute respiratory failure with hypoxia. - Evidence by an O2 sat of 87%, having new oxygen requirement, likely secondary to COPD exacerbation - Continue management as above and titrate down oxygen to maintain saturation between 89-92 percent. - D dimer: neg - Hypoxia resolved.  Tobacco abuse. - On nicotine patch - Cessation counseled extensively and repeatedly.   Essential hypertension  - Controlled.  - Continue amlodipine and Cozaar.   HLD - As per patient and spouse report. - However not on statins.   History of gout - Continue allopurinol. - No acute flare  Chronic lower extremity edema - Unclear etiology. - Serum albumin normal. - Recommend getting urine microscopy and further workup as outpatient as deemed necessary. - 2-D echo results as below showing normal LVEF and grade 1 diastolic dysfunction. Clinically does not appear to be due to CHF. - Start low-dose Lasix when necessary.  Consultations:  None   Procedures:  2-D echo 03/25/15: Study Conclusions  - Left ventricle: Wall thickness was increased in a pattern of mild LVH. Systolic function was normal. The estimated ejection fraction was in the range of 60% to 65%. Wall motion was normal; there were no regional wall motion abnormalities. Doppler parameters are consistent with abnormal left ventricular relaxation (grade 1 diastolic dysfunction).   Discharge Exam:  Complaints:  Denies dyspnea or chest pain. Minimal and intermittent dry cough.  Filed Vitals:   03/26/15 0319 03/26/15 0513 03/26/15 0744 03/26/15 0938  BP:  125/86    Pulse:  97    Temp:  97.6 F (36.4 C)    TempSrc:  Oral    Resp:  22    Weight:    113.1 kg (249 lb 5.4 oz)  SpO2: 90% 93% 92%     General exam: Moderately built and nourished pleasant middle-aged male ambulating comfortably in the hall without assistance. Respiratory system: Clear to auscultation. No increased  work of breathing. Cardiovascular system: S1 & S2 heard, RRR. No JVD, murmurs, gallops, clicks. 1+ pitting bilateral ankle edema Gastrointestinal system: Abdomen is nondistended, soft and nontender. Normal bowel sounds heard. Central nervous system: Alert and oriented. No focal neurological deficits. Extremities: Symmetric 5 x 5 power.  Discharge Instructions      Discharge Instructions    Call MD for:  difficulty breathing, headache or visual disturbances    Complete by:  As directed      Call MD for:  temperature >100.4    Complete by:  As directed      Diet - low sodium heart healthy    Complete by:  As directed      Increase activity slowly    Complete by:  As directed             Medication List    TAKE these medications        albuterol (2.5 MG/3ML) 0.083% nebulizer solution  Commonly known as:  PROVENTIL  Take 3 mLs (2.5 mg total) by nebulization every 6 (six) hours as needed for wheezing or shortness of breath.     PROVENTIL HFA 108 (90 BASE) MCG/ACT inhaler  Generic drug:  albuterol  INHALE 2 PUFFS INTO THE LUNGS EVERY 6 (SIX) HOURS AS NEEDED FOR WHEEZING OR SHORTNESS OF BREATH.     allopurinol 100 MG tablet  Commonly known as:  ZYLOPRIM  TAKE 1 TABLET (100 MG TOTAL) BY MOUTH DAILY.     amLODipine 10 MG tablet  Commonly known as:  NORVASC  Take 1 tablet (10 mg total) by mouth daily.     furosemide 20 MG tablet  Commonly known as:  LASIX  Take 1 tablet (20 mg total) by mouth daily as needed for fluid or edema (Leg swelling.).     levofloxacin 500 MG tablet  Commonly known as:  LEVAQUIN  Take 1 tablet (500 mg total) by mouth daily.     losartan 100 MG tablet  Commonly known as:  COZAAR  Take 1 tablet (100 mg total) by mouth daily.     nicotine 21 mg/24hr patch  Commonly known as:  NICODERM CQ - dosed in mg/24 hours  Place 1 patch (21 mg total) onto the skin daily.     predniSONE 10 MG tablet  Commonly known as:  DELTASONE  Take 5 tabs daily 2 days,  then 4 tabs daily 2 days, then 3 tabs daily 2 days, then 2 tabs daily 2 days, then 1 tab daily 2 days, then stop.       Follow-up Information    Follow up with Sharion Balloon, FNP On 03/31/2015.   Specialty:  Nurse Practitioner   Why:  Please follow up with Evelina Dun, FNP on Tuesday, April 12th at 2:25pm. To be seen with repeat labs (BMP).   Contact information:   [address] [place] [address] [phone]       Follow up with Christinia Gully, MD On 03/27/2015.   Specialty:  Pulmonary Disease   Why:  Keep previous appointment at 11:45 AM.   Contact information:   [address] [place] [address] [phone]        The results of  significant diagnostics from this hospitalization (including imaging, microbiology, ancillary and laboratory) are listed below for reference.    Significant Diagnostic Studies: Dg Chest 2 View  03/23/2015   CLINICAL DATA:  Cough, congestion, and shortness of breath, current everyday smoker.  EXAM: CHEST  2 VIEW  COMPARISON:  PA and lateral chest x-ray of March 06, 2015  FINDINGS: The lungs are hyperinflated with hemidiaphragm flattening. The heart and pulmonary vascularity are normal. The mediastinum is normal in width. There is no pleural effusion. The bony thorax is unremarkable.  IMPRESSION: COPD.  There is no active cardiopulmonary disease.   Electronically Signed   By: David  Martinique   On: 03/23/2015 15:36   Dg Chest 2 View  03/06/2015   CLINICAL DATA:  Hypoxia and shortness of Breath  EXAM: CHEST  2 VIEW  COMPARISON:  10/07/2014  FINDINGS: Cardiac shadow is stable. The lungs are again hyperinflated consistent with COPD. No focal infiltrate or sizable effusion is noted. No acute bony abnormality is seen.  IMPRESSION: COPD without acute abnormality.   Electronically Signed   By: Inez Catalina M.D.   On: 03/06/2015 11:52    Microbiology: No results found for this or any previous visit (from the past 240 hour(s)).   Labs: Basic  Metabolic Panel:  Recent Labs Lab 03/23/15 1454 03/23/15 2118  NA 144 142  K 4.3 4.5  CL 104 105  CO2 27 28  GLUCOSE 106* 105*  BUN 15 15  CREATININE 0.95 0.88  CALCIUM 9.2 9.5   Liver Function Tests:  Recent Labs Lab 03/23/15 2118  AST 26  ALT 43  ALKPHOS 50  BILITOT 0.5  PROT 7.6  ALBUMIN 4.3   No results for input(s): LIPASE, AMYLASE in the last 168 hours. No results for input(s): AMMONIA in the last 168 hours. CBC:  Recent Labs Lab 03/23/15 1454 03/23/15 2118  WBC 8.2 7.9  NEUTROABS  --  3.8  HGB 14.4 14.9  HCT 43.1 45.3  MCV 95 98.5  PLT 216 220   Cardiac Enzymes:  Recent Labs Lab 03/23/15 2118 03/24/15 0230 03/24/15 0933  TROPONINI <0.03 <0.03 <0.03   BNP: BNP (last 3 results) No results for input(s): BNP in the last 8760 hours.  ProBNP (last 3 results) No results for input(s): PROBNP in the last 8760 hours.  CBG: No results for input(s): GLUCAP in the last 168 hours.   Discussed at length with patient's spouse at bedside. Updated care and answered questions.  Signed:  Vernell Leep, MD, FACP, FHM. [facility] Pager [phone]  If 7PM-7AM, please contact night-coverage [email or web address] Password [email or web address] 03/26/2015, 10:11 AM

## 2015-03-26 NOTE — Discharge Instructions (Signed)

## 2015-03-26 NOTE — Progress Notes (Signed)
Chaplain completed advance directive with pt and spouse.  Pt with original and two copies.  Copy given to medical records to scan into EPIC.     [place], [place]

## 2015-03-26 NOTE — Progress Notes (Signed)
Oxygen saturation at rest on room air is 98% Oxygen saturation on ambulation (room air) is 90%.  MD notified. Will continue to monitor.

## 2015-03-26 NOTE — Progress Notes (Signed)
Discharge instructions given to pt, verbalized understanding. Left the unit in stable condition. 

## 2015-03-27 ENCOUNTER — Encounter: Payer: Self-pay | Admitting: Internal Medicine

## 2015-03-27 ENCOUNTER — Ambulatory Visit (INDEPENDENT_AMBULATORY_CARE_PROVIDER_SITE_OTHER): Payer: PRIVATE HEALTH INSURANCE | Admitting: Internal Medicine

## 2015-03-27 VITALS — BP 140/68 | HR 96 | Ht 71.0 in | Wt 262.0 lb

## 2015-03-27 DIAGNOSIS — J449 Chronic obstructive pulmonary disease, unspecified: Secondary | ICD-10-CM

## 2015-03-27 DIAGNOSIS — F17210 Nicotine dependence, cigarettes, uncomplicated: Secondary | ICD-10-CM

## 2015-03-27 DIAGNOSIS — Z720 Tobacco use: Secondary | ICD-10-CM | POA: Diagnosis not present

## 2015-03-27 DIAGNOSIS — R0600 Dyspnea, unspecified: Secondary | ICD-10-CM

## 2015-03-27 MED ORDER — BUDESONIDE-FORMOTEROL FUMARATE 160-4.5 MCG/ACT IN AERO: INHALATION_SPRAY | RESPIRATORY_TRACT | Status: DC

## 2015-03-27 NOTE — Progress Notes (Signed)
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 He was found to have an O2 sat of 87% in the outpatient setting for which he was referred to the emergency department and admitted for COPD exacerbation  Hospital Course:   Chronic obstructive pulmonary disease exacerbation. - Patient was told to have COPD 6 months ago. - Has ongoing 80-pack-year smoking history. Tobacco cessation counseled. - Patient has appointment to see a pulmonologist on 03/27/15- advised to keep that appointment - Treated with oxygen, bronchodilators, IV Solu-Medrol and levofloxacin. - Clinically improved and desaturation evaluation completed and maintains oxygen saturation in the low 90s on room air at rest and with activity. - Chest x-ray without pneumonia. - DC home on prednisone taper and complete 7 days of oral levofloxacin. - Has pulmonology consultation appointment tomorrow at which time may consider starting maintenance medications i.e. Advair/Symbicort.  Acute respiratory failure  with hypoxia. - Evidence by an O2 sat of 87%, having new oxygen requirement, likely secondary to COPD exacerbation - Continue management as above and titrate down oxygen to maintain saturation between 89-92 percent. - D dimer: neg - Hypoxia resolved.  Tobacco abuse. - On nicotine patch - Cessation counseled extensively and repeatedly.   Essential hypertension  - Controlled.  - Continue amlodipine and Cozaar.   HLD - As per patient and spouse report. - However not on statins.  History of gout - Continue allopurinol. - No acute flare  Chronic lower extremity edema - Unclear etiology. - Serum albumin normal. - Recommend getting urine microscopy and further workup as outpatient as deemed necessary. - 2-D echo results as below showing normal LVEF and grade 1 diastolic dysfunction. Clinically does not appear to be due to CHF. - Start low-dose Lasix when necessary.  Consultations:  None  Procedures:  2-D echo 03/25/15: Study Conclusions  - Left ventricle: Wall thickness was increased in a pattern of mild LVH. Systolic function was normal. The estimated ejection fraction was in the range of 60% to 65%. Wall motion was normal; there were no regional wall motion abnormalities. Doppler parameters are consistent with abnormal left ventricular relaxation (grade 1 diastolic dysfunction).        03/27/2015 1st [facility] office visit/ [facility]   Chief Complaint  Patient presents with  . Advice Only    SOB at all times x 1 week; cough, prod at times;   still smoking but working on quiting 75% better since d/c but using saba around the clock in hfa and neb  form  Onset was insidious/pattern progressive x sev weeks to a month pta  No obvious day to day or daytime variabilty or assoc  cp or chest tightness, subjective wheeze overt sinus or hb symptoms. No unusual exp hx or h/o childhood pna/ asthma or knowledge of premature birth.  Sleeping ok without nocturnal  or  early am exacerbation  of respiratory  c/o's or need for noct saba. Also denies any obvious fluctuation of symptoms with weather or environmental changes or other aggravating or alleviating factors except as outlined above   Current Medications, Allergies, Complete Past Medical History, Past Surgical History, Family History, and Social History were reviewed in [employer] record.  ROS  The following are not active complaints unless bolded sore throat, dysphagia, dental problems, itching, sneezing,  nasal congestion or excess/ purulent secretions, ear ache,   fever, chills, sweats, unintended wt loss, pleuritic or exertional cp, hemoptysis,  orthopnea pnd or leg swelling, presyncope, palpitations, heartburn, abdominal pain, anorexia, nausea, vomiting, diarrhea  or change in bowel or urinary habits, change in stools or urine, dysuria,hematuria,  rash, arthralgias, visual complaints, headache, numbness weakness or ataxia or problems with walking or coordination,  change in mood/affect or memory.          Review of Systems     Objective:   Physical Exam    amb wm nad  Wt Readings from Last 3 Encounters:  03/27/15 262 lb (118.842 kg)  03/26/15 249 lb 5.4 oz (113.1 kg)  03/23/15 246 lb (111.585 kg)    Vital signs reviewed    HEENT: nl dentition, turbinates, and orophanx. Nl external ear canals without cough reflex   NECK :  without JVD/Nodes/TM/ nl carotid upstrokes bilaterally   LUNGS: no acc muscle use, insp and exp rhonchi R > L    CV:  RRR  no s3 or murmur or increase in P2, no edema   ABD:  Pot bellied soft and nontender with nl excursion in the supine position except end insp hoover's.  No bruits or organomegaly, bowel sounds nl  MS:  warm without deformities, calf tenderness, cyanosis or clubbing  SKIN: warm and dry without lesions    NEURO:  alert, approp, no deficits     cxr 03/23/15 COPD. There is no active cardiopulmonary  disease. Assessment:

## 2015-03-27 NOTE — Patient Instructions (Addendum)
The key is to stop smoking completely before smoking completely stops you!   Finish your prednisone and levaquin  Plan A = automatic = Symbicort 160 Take 2 puffs first thing in am and then another 2 puffs about 12 hours later.   Plan B=backup Only use your albuterol (proventil)  as a rescue medication to be used if you can't catch your breath by resting or doing a relaxed purse lip breathing pattern.  - The less you use it, the better it will work when you need it. - Ok to use up to 2 puffs  every 4 hours if you must but call for immediate appointment if use goes up over your usual need - Don't leave home without it !!  (think of it like the spare tire for your car)   Plan C =Crisis = use nebulizer if you must up to every 4 hours if needed   Please schedule a follow up office visit in 4 weeks, sooner if needed with pfts on return

## 2015-03-29 DIAGNOSIS — J449 Chronic obstructive pulmonary disease, unspecified: Secondary | ICD-10-CM | POA: Insufficient documentation

## 2015-03-29 NOTE — Assessment & Plan Note (Addendum)
Appears to have at least moderate copd with ongoing AB  DDX of  difficult airways management all start with A and  include Adherence, Ace Inhibitors, Acid Reflux, Active Sinus Disease, Alpha 1 Antitripsin deficiency, Anxiety masquerading as Airways dz,  ABPA,  allergy(esp in young), Aspiration (esp in elderly), Adverse effects of DPI,  Active smokers, plus two Bs  = Bronchiectasis and Beta blocker use..and one C= CHF   In this case Adherence is the biggest issue and starts with  inability to use HFA effectively and also  understand that SABA treats the symptoms but doesn't get to the underlying problem (inflammation).  I used  the analogy of putting steroid cream on a rash to help explain the meaning of topical therapy and the need to get the drug to the target tissue.  .  - The proper method of use, as well as anticipated side effects, of a metered-dose inhaler are discussed and demonstrated to the patient. Improved effectiveness after extensive coaching during this visit to a level of approximately  75% so continue symbicort 160 2bid as maint  Active smoking > sep a/p  ? Allergy > finish pred  ? Active sinus dz >finish levaquin    Each maintenance medication was reviewed in detail including most importantly the difference between maintenance and as needed and under what circumstances the prns are to be used.  Please see instructions for details which were reviewed in writing and the patient given a copy.    F/u in 4 weeks with pfts

## 2015-03-29 NOTE — Assessment & Plan Note (Signed)

## 2015-03-29 NOTE — Assessment & Plan Note (Signed)
W/u in progress/ no evidence of chf or alternative dx

## 2015-03-31 ENCOUNTER — Ambulatory Visit: Payer: PRIVATE HEALTH INSURANCE | Admitting: Family

## 2015-05-01 ENCOUNTER — Ambulatory Visit (INDEPENDENT_AMBULATORY_CARE_PROVIDER_SITE_OTHER)
Admission: RE | Admit: 2015-05-01 | Discharge: 2015-05-01 | Disposition: A | Discharge status: Home / Self Care | Payer: PRIVATE HEALTH INSURANCE | Source: Ambulatory Visit | Attending: Internal Medicine | Admitting: Internal Medicine

## 2015-05-01 ENCOUNTER — Encounter: Payer: Self-pay | Admitting: Internal Medicine

## 2015-05-01 ENCOUNTER — Ambulatory Visit (INDEPENDENT_AMBULATORY_CARE_PROVIDER_SITE_OTHER): Payer: PRIVATE HEALTH INSURANCE | Admitting: Internal Medicine

## 2015-05-01 VITALS — BP 130/82 | HR 91 | Ht 70.5 in | Wt 261.0 lb

## 2015-05-01 DIAGNOSIS — R0600 Dyspnea, unspecified: Secondary | ICD-10-CM

## 2015-05-01 DIAGNOSIS — J449 Chronic obstructive pulmonary disease, unspecified: Secondary | ICD-10-CM | POA: Diagnosis not present

## 2015-05-01 DIAGNOSIS — R635 Abnormal weight gain: Secondary | ICD-10-CM

## 2015-05-01 LAB — PULMONARY FUNCTION TEST
DL/VA % pred: 91 %
DL/VA: 4.24 ml/min/mmHg/L
DLCO unc % pred: 82 %
DLCO unc: 27.39 ml/min/mmHg
FEF 25-75 POST: 1.86 L/s
FEF 25-75 Pre: 1.28 L/sec
FEF2575-%Change-Post: 45 %
FEF2575-%Pred-Post: 63 %
FEF2575-%Pred-Pre: 43 %
FEV1-%Change-Post: 11 %
FEV1-%Pred-Post: 69 %
FEV1-%Pred-Pre: 61 %
FEV1-POST: 2.5 L
FEV1-PRE: 2.23 L
FEV1FVC-%Change-Post: 4 %
FEV1FVC-%Pred-Pre: 88 %
FEV6-%CHANGE-POST: 8 %
FEV6-%PRED-PRE: 71 %
FEV6-%Pred-Post: 77 %
FEV6-POST: 3.55 L
FEV6-Pre: 3.28 L
FEV6FVC-%Change-Post: 0 %
FEV6FVC-%PRED-PRE: 104 %
FEV6FVC-%Pred-Post: 103 %
FVC-%Change-Post: 7 %
FVC-%PRED-PRE: 70 %
FVC-%Pred-Post: 75 %
FVC-POST: 3.62 L
FVC-PRE: 3.38 L
Post FEV1/FVC ratio: 69 %
Post FEV6/FVC ratio: 98 %
Pre FEV1/FVC ratio: 66 %
Pre FEV6/FVC Ratio: 99 %
RV % pred: 148 %
RV: 3.44 L
TLC % pred: 100 %
TLC: 7.15 L

## 2015-05-01 MED ORDER — ACLIDINIUM BROMIDE 400 MCG/ACT IN AEPB: 1.0000 | INHALATION_SPRAY | Freq: Two times a day (BID) | RESPIRATORY_TRACT | Status: DC

## 2015-05-01 NOTE — Progress Notes (Signed)
PFT done today. 

## 2015-05-01 NOTE — Patient Instructions (Signed)
Add turdoza one twice daily right after the symbicort 160   Your copd is mild and aggravated by your weight which is definitely affecting your breathing  Please see patient coordinator before you leave today  to schedule CT with contrast  The key is to stop smoking completely before smoking completely stops you - it's the most important aspect of your care   If you are satisfied with your treatment plan,  let your doctor know and he/she can either refill your medications or you can return here when your prescription runs out.     If in any way you are not 100% satisfied,  please tell us.  If 100% better, tell your friends!  Pulmonary follow up is as needed

## 2015-05-01 NOTE — Progress Notes (Signed)
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 with documented GOLD II copd 5/12/116  much worse x mid March 2015 referred by Juan Merritt at [facility] but in meantime  > admitted [facility] with aecopd:   Admit date: 03/23/2015 Discharge date: 03/26/2015      Discharge Diagnoses:  Principal Problem:  COPD exacerbation   Acute respiratory failure  Tobacco abuse     Filed Weights   03/23/15 2210 03/25/15 0512 03/26/15 0938  Weight: 113.626 kg (250 lb 8 oz) 114.2 kg (251 lb 12.3 oz) 113.1 kg (249 lb 5.4 oz)    History of present illness:  Patient is a 64 year old male, 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 He was found to have an O2 sat of 87% in the outpatient setting for which he was referred to the emergency department and admitted for COPD exacerbation  Hospital Course:   Chronic obstructive pulmonary disease exacerbation. - Patient was told to have COPD 6 months ago. - Has ongoing 80-pack-year smoking history. Tobacco cessation counseled. - Patient has appointment to see a pulmonologist on 03/27/15- advised to keep that appointment - Treated with oxygen, bronchodilators, IV Solu-Medrol and levofloxacin. - Clinically improved and desaturation evaluation completed and maintains oxygen saturation in the low 90s on room air at rest and with activity. - Chest x-ray without pneumonia. - DC home on prednisone taper and complete 7 days of oral levofloxacin. - Has pulmonology consultation appointment tomorrow at which time may consider starting maintenance medications i.e. Advair/Symbicort.  Acute respiratory failure with  hypoxia. - Evidence by an O2 sat of 87%, having new oxygen requirement, likely secondary to COPD exacerbation - Continue management as above and titrate down oxygen to maintain saturation between 89-92 percent. - D dimer: neg - Hypoxia resolved.  Tobacco abuse. - On nicotine patch - Cessation counseled extensively and repeatedly.   Essential hypertension  - Controlled.  - Continue amlodipine and Cozaar.   HLD - As per patient and spouse report. - However not on statins.  History of gout - Continue allopurinol. - No acute flare  Chronic lower extremity edema - Unclear etiology. - Serum albumin normal. - Recommend getting urine microscopy and further workup as outpatient as deemed necessary. - 2-D echo results as below showing normal LVEF and grade 1 diastolic dysfunction. Clinically does not appear to be due to CHF. - Start low-dose Lasix when necessary.  Consultations:  None  Procedures:  2-D echo 03/25/15: Study Conclusions  - Left ventricle: Wall thickness was increased in a pattern of mild LVH. Systolic function was normal. The estimated ejection fraction was in the range of 60% to 65%. Wall motion was normal; there were no regional wall motion abnormalities. Doppler parameters are consistent with abnormal left ventricular relaxation (grade 1 diastolic dysfunction).        03/27/2015 1st [facility] office visit/ Juan Merritt   Chief Complaint  Patient presents with  . Advice Only    SOB at all times x 1 week; cough, prod at times;   still smoking but working on quiting 75% better since d/c but using saba around the clock  in hfa and neb form  Onset was insidious/pattern progressive x sev weeks to a month pta rec The key is to stop smoking completely before smoking completely stops you!  Finish your prednisone and levaquin Plan A = automatic = Symbicort 160 Take 2 puffs first thing in am and then another 2 puffs about 12 hours later.  Plan  B=backup Only use your albuterol (proventil)  as a rescue medication Plan C =Crisis = use nebulizer if you must up to every 4 hours if needed    05/01/2015 f/u ov/Juan Merritt re: copd GOLD II still smoking  Chief Complaint  Patient presents with  . Follow-up    PFT done today. Breathing is unchanged since the last visit. No new co's. He has not used rescue inhaler or neb.   can walk flat fine as long as don't get in a hurry or steep incline MMRC 1   No obvious day to day or daytime variabilty or assoc cough  cp or chest tightness, subjective wheeze overt sinus or hb symptoms. No unusual exp hx or h/o childhood pna/ asthma or knowledge of premature birth.  Sleeping ok without nocturnal  or early am exacerbation  of respiratory  c/o's or need for noct saba. Also denies any obvious fluctuation of symptoms with weather or environmental changes or other aggravating or alleviating factors except as outlined above   Current Medications, Allergies, Complete Past Medical History, Past Surgical History, Family History, and Social History were reviewed in [employer] record.  ROS  The following are not active complaints unless bolded sore throat, dysphagia, dental problems, itching, sneezing,  nasal congestion or excess/ purulent secretions, ear ache,   fever, chills, sweats, unintended wt loss, pleuritic or exertional cp, hemoptysis,  orthopnea pnd or leg swelling, presyncope, palpitations, heartburn, abdominal pain, anorexia, nausea, vomiting, diarrhea  or change in bowel or urinary habits, change in stools or urine, dysuria,hematuria,  rash, arthralgias, visual complaints, headache, numbness weakness or ataxia or problems with walking or coordination,  change in mood/affect or memory.               Objective:   Physical Exam    amb wm nad  05/01/2015       261  Wt Readings from Last 3 Encounters:  03/27/15 262 lb (118.842 kg)  03/26/15 249 lb 5.4 oz (113.1 kg)  03/23/15 246  lb (111.585 kg)    Vital signs reviewed    HEENT: nl dentition, turbinates, and orophanx. Nl external ear canals without cough reflex   NECK :  without JVD/Nodes/TM/ nl carotid upstrokes bilaterally   LUNGS: no acc muscle use, insp and exp rhonchi bilaterally    CV:  RRR  no s3 or murmur or increase in P2, no edema   ABD:  Pot bellied soft and nontender with nl excursion in the supine position except end insp hoover's.  No bruits or organomegaly, bowel sounds nl  MS:  warm without deformities, calf tenderness, cyanosis or clubbing  SKIN: warm and dry without lesions    NEURO:  alert, approp, no deficits     I personally reviewed images and agree with radiology impression as follows:  CTchest s contrast:  05/01/15    1. Centrilobular emphysema. 2. Coronary, aortic arch, and branch vessel atherosclerotic calcification. 3. Lingular subsegmental atelectasis or scarring.  Assessment:

## 2015-05-02 ENCOUNTER — Encounter: Payer: Self-pay | Admitting: Internal Medicine

## 2015-05-02 DIAGNOSIS — R635 Abnormal weight gain: Secondary | ICD-10-CM | POA: Insufficient documentation

## 2015-05-02 NOTE — Assessment & Plan Note (Signed)
PFTs 05/01/15  ERV 45% pred   Reviewed implications of the low ERV and how to achieve consistent neg calorie balance in order to begin to correct it

## 2015-05-02 NOTE — Assessment & Plan Note (Signed)
Due to asym rhonchi rec CT chest with contrast but he declined IV so done s contrast but showed nothing on R to suggest a mechanical cause for asymmetry  on exam .so no change in recs

## 2015-05-02 NOTE — Assessment & Plan Note (Addendum)
-   03/27/2015 p extensive coaching HFA effectiveness =    75%  - PFTs  05/01/2015  FEV1  2.50 (69%) ratio 69 p 11% improvement from saba  p am symbicort and dlco 82  I had an extended discussion with the patient reviewing all relevant studies completed to date and  lasting 15 to 20 minutes of a 25 minute visit on the following ongoing concerns:    I reviewed the Flethcher curve with patient that basically indicates  if you quit smoking when your best day FEV1 is still well preserved (as is clearly the case here)  it is highly unlikely you will progress to severe disease and informed the patient there was no medication on the market that has proven to change the curve or the likelihood of progression.  Therefore stopping smoking and maintaining abstinence is the most important aspect of care, not choice of inhalers or for that matter, doctors.    The proper method of use, as well as anticipated side effects, of a dry powder inhaler are discussed and demonstrated to the patient. Improved effectiveness after extensive coaching during this visit to a level of approximately  90% > try adding tudorza to see if doe improves

## 2015-05-09 ENCOUNTER — Other Ambulatory Visit: Payer: Self-pay | Admitting: Family Medicine

## 2015-05-25 ENCOUNTER — Ambulatory Visit: Payer: PRIVATE HEALTH INSURANCE | Admitting: Physician Assistant

## 2015-05-25 ENCOUNTER — Ambulatory Visit (INDEPENDENT_AMBULATORY_CARE_PROVIDER_SITE_OTHER): Payer: PRIVATE HEALTH INSURANCE | Admitting: Physician Assistant

## 2015-05-25 ENCOUNTER — Encounter: Payer: Self-pay | Admitting: Physician Assistant

## 2015-05-25 ENCOUNTER — Encounter: Payer: Self-pay | Admitting: *Deleted

## 2015-05-25 VITALS — BP 109/69 | HR 96 | Temp 97.1°F | Ht 70.5 in | Wt 261.0 lb

## 2015-05-25 DIAGNOSIS — M25473 Effusion, unspecified ankle: Secondary | ICD-10-CM

## 2015-05-25 DIAGNOSIS — M25511 Pain in right shoulder: Secondary | ICD-10-CM

## 2015-05-25 DIAGNOSIS — R609 Edema, unspecified: Secondary | ICD-10-CM

## 2015-05-25 MED ORDER — PREDNISONE 10 MG (21) PO TBPK: ORAL_TABLET | ORAL | Status: DC

## 2015-05-25 MED ORDER — MELOXICAM 15 MG PO TABS: 15.0000 mg | ORAL_TABLET | Freq: Every day | ORAL | Status: DC

## 2015-05-25 NOTE — Patient Instructions (Addendum)
Peripheral Edema You have swelling in your legs (peripheral edema). This swelling is due to excess accumulation of salt and water in your body. Edema may be a sign of heart, kidney or liver disease, or a side effect of a medication. It may also be due to problems in the leg veins. Elevating your legs and using special support stockings may be very helpful, if the cause of the swelling is due to poor venous circulation. Avoid long periods of standing, whatever the cause. Treatment of edema depends on identifying the cause. Chips, pretzels, pickles and other salty foods should be avoided. Restricting salt in your diet is almost always needed. Water pills (diuretics) are often used to remove the excess salt and water from your body via urine. These medicines prevent the kidney from reabsorbing sodium. This increases urine flow. Diuretic treatment may also result in lowering of potassium levels in your body. Potassium supplements may be needed if you have to use diuretics daily. Daily weights can help you keep track of your progress in clearing your edema. You should call your caregiver for follow up care as recommended. SEEK IMMEDIATE MEDICAL CARE IF:   You have increased swelling, pain, redness, or heat in your legs.  You develop shortness of breath, especially when lying down.  You develop chest or abdominal pain, weakness, or fainting.  You have a fever. Document Released: 01/12/2005 Document Revised: 02/27/2012 Document Reviewed: 12/23/2009 [facility] Patient Information 2015 [place], [place]. This information is not intended to replace advice given to you by your health care provider. Make sure you discuss any questions you have with your health care provider. Shoulder Pain The shoulder is the joint that connects your arms to your body. The bones that form the shoulder joint include the upper arm bone (humerus), the shoulder blade (scapula), and the collarbone (clavicle). The top of the humerus is  shaped like a ball and fits into a rather flat socket on the scapula (glenoid cavity). A combination of muscles and strong, fibrous tissues that connect muscles to bones (tendons) support your shoulder joint and hold the ball in the socket. Small, fluid-filled sacs (bursae) are located in different areas of the joint. They act as cushions between the bones and the overlying soft tissues and help reduce friction between the gliding tendons and the bone as you move your arm. Your shoulder joint allows a wide range of motion in your arm. This range of motion allows you to do things like scratch your back or throw a ball. However, this range of motion also makes your shoulder more prone to pain from overuse and injury. Causes of shoulder pain can originate from both injury and overuse and usually can be grouped in the following four categories:  Redness, swelling, and pain (inflammation) of the tendon (tendinitis) or the bursae (bursitis).  Instability, such as a dislocation of the joint.  Inflammation of the joint (arthritis).  Broken bone (fracture). HOME CARE INSTRUCTIONS   Apply ice to the sore area.  Put ice in a plastic bag.  Place a towel between your skin and the bag.  Leave the ice on for 15-20 minutes, 3-4 times per day for the first 2 days, or as directed by your health care provider.  Stop using cold packs if they do not help with the pain.  If you have a shoulder sling or immobilizer, wear it as long as your caregiver instructs. Only remove it to shower or bathe. Move your arm as little as possible, but  keep your hand moving to prevent swelling.  Squeeze a soft ball or foam pad as much as possible to help prevent swelling.  Only take over-the-counter or prescription medicines for pain, discomfort, or fever as directed by your caregiver. SEEK MEDICAL CARE IF:   Your shoulder pain increases, or new pain develops in your arm, hand, or fingers.  Your hand or fingers become cold  and numb.  Your pain is not relieved with medicines. SEEK IMMEDIATE MEDICAL CARE IF:   Your arm, hand, or fingers are numb or tingling.  Your arm, hand, or fingers are significantly swollen or turn white or blue. MAKE SURE YOU:   Understand these instructions.  Will watch your condition.  Will get help right away if you are not doing well or get worse. Document Released: 09/14/2005 Document Revised: 04/21/2014 Document Reviewed: 11/19/2011 [facility] Patient Information 2015 [place], [place]. This information is not intended to replace advice given to you by your health care provider. Make sure you discuss any questions you have with your health care provider.

## 2015-05-25 NOTE — Progress Notes (Signed)
   Subjective:    Patient ID: Juan Merritt, male    DOB: 1951-04-28, 65 y.o.   MRN: [id number]  HPI 64 y/o male presents with c/o right shoulder pain, radiates to his elbow s/p climbing up into his truck , holding the hand rail and slipped off of the step. He has had pain since the incident. Pain is intermittent, radiates to right neck and hand. Has been taking Aleve for pain relief, two tablets once daily, with some relief. However, he has had increased swelling in feet and legs. He does not add salt to his diet, however drinks 6-12 beers daily and several mtn dews.   He was hospitalized in April and given Lasix for fluid retention at that time.     Review of Systems  Constitutional: Negative.   Respiratory: Negative.   Cardiovascular: Positive for leg swelling (bilateral , does not decrease at night , primlarily locallized to ankles and feet ).  Musculoskeletal: Positive for arthralgias (right shoulder pain, varies in intensity and location ( right neck to right forearm) aching pain ).       Objective:   Physical Exam  Constitutional: He is oriented to person, place, and time. He appears well-developed and well-nourished.  Cardiovascular: Normal rate, regular rhythm, normal heart sounds and intact distal pulses.   Pulmonary/Chest: Effort normal and breath sounds normal. No respiratory distress. He has no wheezes. He has no rales. He exhibits no tenderness.  Musculoskeletal: He exhibits edema (edema in bilateral feet and ankles ) and tenderness (ac joint ttp ).  Pain with resisted forward flexion and abduction  Neurological: He is alert and oriented to person, place, and time.  Psychiatric: He has a normal mood and affect. His behavior is normal. Judgment and thought content normal.  Vitals reviewed.         Assessment & Plan:  1. Right shoulder pain  - meloxicam (MOBIC) 15 MG tablet; Take 1 tablet (15 mg total) by mouth daily.  Dispense: 30 tablet; Refill: 0 - F/U in 2  weeks. If no improvement will refer to ortho for MRI an further assessment.  - Instructions given for rest, ice TID x 47min  2. Ankle edema - discussed diet and healthy eating/drinking with patient. I feel that his edema is coming from sodium and alcohol intake. Instructions were given on healthy eating to prevent LE edema. I do not feel that this is related to his gout due to lack of pain. Patient is a truck driver - I feel that this also contributes to his LE swelling due to his legs handing down for long amoutns of time. I have reviewed past labs and recent doppler of heart that indicated no abnormalities that would contribute to edema. Encouraged patient to wear compression stockings along with a better diet.    F/U 2 weeks   Gerrald Basu A. Benjamin Stain PA-C

## 2015-06-02 ENCOUNTER — Telehealth: Payer: Self-pay | Admitting: *Deleted

## 2015-06-02 NOTE — Telephone Encounter (Signed)
PA received from [facility] pharmacy for Juan Merritt. PA submitted on [email or web address]. Key: [id number] Patient has tried and failed: Symbicort, Proventil HFA, Advair and Sprirva.

## 2015-06-03 NOTE — Telephone Encounter (Signed)
Received a message from [place] has been approved. Pharmacy has been notifeid via fax. Patient also notified.

## 2015-06-06 ENCOUNTER — Other Ambulatory Visit: Payer: Self-pay | Admitting: Physician Assistant

## 2015-06-18 ENCOUNTER — Ambulatory Visit: Payer: PRIVATE HEALTH INSURANCE | Admitting: Family

## 2015-06-19 ENCOUNTER — Ambulatory Visit: Payer: PRIVATE HEALTH INSURANCE | Admitting: Family

## 2015-06-21 ENCOUNTER — Other Ambulatory Visit: Payer: Self-pay | Admitting: Physician Assistant

## 2015-07-06 ENCOUNTER — Encounter: Payer: Self-pay | Admitting: Family Medicine

## 2015-07-06 ENCOUNTER — Ambulatory Visit (INDEPENDENT_AMBULATORY_CARE_PROVIDER_SITE_OTHER): Payer: PRIVATE HEALTH INSURANCE

## 2015-07-06 ENCOUNTER — Ambulatory Visit (INDEPENDENT_AMBULATORY_CARE_PROVIDER_SITE_OTHER): Payer: PRIVATE HEALTH INSURANCE | Admitting: Family Medicine

## 2015-07-06 VITALS — BP 124/80 | HR 87 | Temp 97.1°F | Ht 70.5 in | Wt 266.0 lb

## 2015-07-06 DIAGNOSIS — M75111 Incomplete rotator cuff tear or rupture of right shoulder, not specified as traumatic: Secondary | ICD-10-CM | POA: Diagnosis not present

## 2015-07-06 DIAGNOSIS — M25511 Pain in right shoulder: Secondary | ICD-10-CM | POA: Diagnosis not present

## 2015-07-06 MED ORDER — PREDNISONE 10 MG PO TABS: ORAL_TABLET | ORAL | Status: DC

## 2015-07-06 NOTE — Progress Notes (Signed)
Subjective:  Patient ID: Juan Merritt, male    DOB: [birth year]  Age: 64 y.o. MRN: [id number]  CC: Shoulder Pain   HPI [name] presents for increasing right shoulder pain. This is been going on for several months. He has been seen on various occasions with no significant relief. Unfortunately the pain continues to get worse. 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 He has not had any type of infection that he knows of. There is no heat to the joint. It is limited in range of motion due to pain. The shoulder is also become weak for most range of motion.  History [name] has a past medical history of Hypertension; Gout; and COPD (chronic obstructive pulmonary disease).   He has past surgical history that includes Cholecystectomy and Tonsillectomy.   His family history includes COPD in his mother; Diabetes in his father.He reports that he has been smoking Cigarettes.  He has a 10 pack-year smoking history. He does not have any smokeless tobacco history on file. He reports that he drinks about 6.0 oz of alcohol per week. He reports that he does not use illicit drugs.  Outpatient Prescriptions Prior to Visit  Medication Sig Dispense Refill  . albuterol (PROVENTIL) (2.5 MG/3ML) 0.083% nebulizer solution Take 3 mLs (2.5 mg total) by nebulization every 6 (six) hours as needed for wheezing or shortness of breath. 150 mL 1  . allopurinol (ZYLOPRIM) 100 MG tablet TAKE 1 TABLET (100 MG TOTAL) BY MOUTH DAILY. 30 tablet 2  . amLODipine (NORVASC) 10 MG tablet Take 1 tablet (10 mg total) by mouth daily. 90 tablet 1  . budesonide-formoterol (SYMBICORT) 160-4.5 MCG/ACT inhaler Take 2 puffs first thing in am and then another 2 puffs about 12 hours later. 1 Inhaler 11  . losartan (COZAAR) 100 MG tablet Take 1 tablet (100 mg total) by mouth daily. 90 tablet  1  . meloxicam (MOBIC) 15 MG tablet TAKE 1 TABLET (15 MG TOTAL) BY MOUTH DAILY. 30 tablet 1  . PROVENTIL HFA 108 (90 BASE) MCG/ACT inhaler INHALE 2 PUFFS INTO THE LUNGS EVERY 6 (SIX) HOURS AS NEEDED FOR WHEEZING OR SHORTNESS OF BREATH. 6.7 each 2  . predniSONE (STERAPRED UNI-PAK 21 TAB) 10 MG (21) TBPK tablet Take as directed (Patient not taking: Reported on 07/06/2015) 21 tablet 0   No facility-administered medications prior to visit.    ROS Review of Systems  Constitutional: Negative for fever, chills and diaphoresis.  HENT: Negative for congestion, rhinorrhea and sore throat.   Respiratory: Negative for cough, shortness of breath and wheezing.   Cardiovascular: Negative for chest pain.  Gastrointestinal: Negative for nausea, vomiting, abdominal pain, diarrhea, constipation and abdominal distention.  Genitourinary: Negative for dysuria and frequency.  Musculoskeletal: Positive for myalgias, joint swelling and arthralgias.  Skin: Negative for rash.  Neurological: Negative for headaches.    Objective:  BP 124/80 mmHg  Pulse 87  Temp(Src) 97.1 F (36.2 C) (Oral)  Ht 5' 10.5" (1.791 m)  Wt 266 lb (120.657 kg)  BMI 37.62 kg/m2  BP Readings from Last 3 Encounters:  07/06/15 124/80  05/25/15 109/69  05/01/15 130/82    Wt Readings from Last 3 Encounters:  07/06/15 266 lb (120.657 kg)  05/25/15 261 lb (118.389 kg)  05/01/15 261 lb (118.389 kg)     Physical Exam  Constitutional: He appears well-developed  and well-nourished.  HENT:  Head: Normocephalic and atraumatic.  Right Ear: Tympanic membrane and external ear normal. No decreased hearing is noted.  Left Ear: Tympanic membrane and external ear normal. No decreased hearing is noted.  Mouth/Throat: No oropharyngeal exudate or posterior oropharyngeal erythema.  Eyes: Pupils are equal, round, and reactive to light.  Neck: Normal range of motion. Neck supple.  Cardiovascular: Normal rate and regular rhythm.   No murmur  heard. Pulmonary/Chest: Breath sounds normal. No respiratory distress.  Abdominal: Soft. Bowel sounds are normal. He exhibits no mass. There is no tenderness.  Musculoskeletal: He exhibits tenderness (there is moderate tenderness about the anterior and posterior aspects of the glenohumeral region. The patient has significant pain with elevation of the shoulder. Including abduction. There is weakness for resisted abduction. There is 3/5 weakness for exte).  Vitals reviewed.   No results found for: HGBA1C  Lab Results  Component Value Date   WBC 7.9 03/23/2015   HGB 14.9 03/23/2015   HCT 45.3 03/23/2015   PLT 220 03/23/2015   GLUCOSE 105* 03/23/2015   ALT 43 03/23/2015   AST 26 03/23/2015   NA 142 03/23/2015   K 4.5 03/23/2015   CL 105 03/23/2015   CREATININE 0.88 03/23/2015   BUN 15 03/23/2015   CO2 28 03/23/2015   TSH 1.542 03/23/2015    Ct Chest Wo Contrast  05/01/2015   CLINICAL DATA:  Worsening dyspnea over the past month. Recent hospitalization for pneumonia.  EXAM: CT CHEST WITHOUT CONTRAST  TECHNIQUE: Multidetector CT imaging of the chest was performed following the standard protocol without IV contrast.  COMPARISON:  03/23/2015  FINDINGS: Mediastinum/Nodes: Coronary, aortic arch, and branch vessel atherosclerotic calcification. Small right lower paratracheal lymph nodes are not pathologically enlarged. Subcutaneous lesion, likely a sebaceous cyst, just to the left of midline anterior to the sternum on image 26 series 2, 1.5 by 2.5 cm.  Lungs/Pleura: Centrilobular emphysema. 2 mm subpleural lymph node along the upper margin of the left major fissure, thought to be clinically inconsequential. Lingular subsegmental atelectasis or scarring.  Upper abdomen: Unremarkable  Musculoskeletal: Mild thoracic spondylosis. Old healed left lateral rib fractures.  IMPRESSION: 1. Centrilobular emphysema. 2. Coronary, aortic arch, and branch vessel atherosclerotic calcification. 3. Lingular  subsegmental atelectasis or scarring.   Electronically Signed   By: Van Clines M.D.   On: 05/01/2015 14:49    Assessment & Plan:   [name] was seen today for shoulder pain.  Diagnoses and all orders for this visit:  Pain in right shoulder Orders: -     DG Shoulder Right; Future -     Ambulatory referral to Physical Therapy  Other orders -     predniSONE (DELTASONE) 10 MG tablet; Take 5 daily for 3 days followed by 4,3,2 and 1 for 3 days each.   I have discontinued Mr. Ivy's predniSONE. I am also having him start on predniSONE. Additionally, I am having him maintain his albuterol, PROVENTIL HFA, amLODipine, losartan, budesonide-formoterol, allopurinol, and meloxicam.  Meds ordered this encounter  Medications  . predniSONE (DELTASONE) 10 MG tablet    Sig: Take 5 daily for 3 days followed by 4,3,2 and 1 for 3 days each.    Dispense:  45 tablet    Refill:  0     Follow-up: No Follow-up on file.  [name], M.D.

## 2015-07-07 ENCOUNTER — Telehealth: Payer: Self-pay | Admitting: Family Medicine

## 2015-07-07 NOTE — Telephone Encounter (Signed)
That is fine. Please write & I will sign

## 2015-07-08 ENCOUNTER — Encounter: Payer: Self-pay | Admitting: *Deleted

## 2015-07-08 NOTE — Telephone Encounter (Signed)
Pt aware letter is ready 

## 2015-07-20 ENCOUNTER — Ambulatory Visit: Payer: PRIVATE HEALTH INSURANCE | Attending: Family Medicine | Admitting: Physical Therapy

## 2015-07-20 ENCOUNTER — Telehealth: Payer: Self-pay | Admitting: Family Medicine

## 2015-07-20 DIAGNOSIS — M25511 Pain in right shoulder: Secondary | ICD-10-CM | POA: Insufficient documentation

## 2015-07-20 DIAGNOSIS — R29898 Other symptoms and signs involving the musculoskeletal system: Secondary | ICD-10-CM

## 2015-07-20 NOTE — Therapy (Signed)
[facility] [place], [place], [address] Phone: [phone]   Fax:  [phone]  Physical Therapy Evaluation  Patient Details  Name: Juan Merritt MRN: [id number] Date of Birth: 03-02-1951 Referring Provider:  [name], MD  Encounter Date: 07/20/2015      PT End of Session - 07/20/15 1336    Visit Number 1   Number of Visits 12   Date for PT Re-Evaluation 08/31/15   PT Start Time 1244   PT Stop Time 1331   PT Time Calculation (min) 47 min   Activity Tolerance Patient tolerated treatment well   Behavior During Therapy [facility] for tasks assessed/performed      Past Medical History  Diagnosis Date  . Hypertension   . Gout   . COPD (chronic obstructive pulmonary disease)     not on home o2    Past Surgical History  Procedure Laterality Date  . Cholecystectomy    . Tonsillectomy      There were no vitals filed for this visit.  Visit Diagnosis:  Right shoulder pain - Plan: PT plan of care cert/re-cert  Shoulder weakness - Plan: PT plan of care cert/re-cert      Subjective Assessment - 07/20/15 1316    Subjective Steroid medication helped.   Limitations --  Reaching.   Patient Stated Goals Return to work.   Currently in Pain? Yes   Pain Score 4    Pain Location Shoulder   Pain Orientation Right   Pain Descriptors / Indicators Aching;Guarding;Throbbing   Pain Type --  Sub-acute.   Pain Onset More than a month ago   Aggravating Factors  Reaching.   Pain Relieving Factors Rest and medication.            [facility] PT Assessment - 07/20/15 0001    Assessment   Medical Diagnosis Right shoulder pain.   Onset Date/Surgical Date --  1-2 months ago.   Precautions   Precautions None   Restrictions   Weight Bearing Restrictions No   Balance Screen   Has the patient fallen in the past 6 months No   Has the patient had a decrease in activity level because of a fear of falling?  No   Is the patient  reluctant to leave their home because of a fear of falling?  No   Home Ecologist residence   Prior Function   Level of Independence Independent   Cognition   Overall Cognitive Status Within Functional Limits for tasks assessed   ROM / Strength   AROM / PROM / Strength AROM;Strength   AROM   Overall AROM Comments Full active right shoulder range of motion (equal to left) with the exception behind back with is limited to right SIJ region.   Strength   Overall Strength Comments Right shoulder ER= 3/5 and IR= 3+/5 and painful.  Right shoulder abuction= 3-/5 and flexion= 3+/5.   Palpation   Palpation comment Very tender to palpation over right bicipital groove.   Special Tests    Special Tests Rotator Cuff Impingement   Rotator Cuff Impingment tests Neer impingement test;Hawkins- Kennedy test;Drop Arm test;Speed's test;Painful Arc of Motion   Neer Impingement test    Findings Positive   Side Right   Hawkins-Kennedy test   Findings Positive   Side Right   Drop Arm test   Findings Negative   Side Right   Speed's test   Findings --  Mildly positive.  Painful Arc of Motion   Findings --  Pain with right shoulder abduction.                   [facility] Adult PT Treatment/Exercise - 07/20/15 0001    Modalities   Modalities Electrical Stimulation   Electrical Stimulation   Electrical Stimulation Location Right anterior shoulder.   Electrical Stimulation Action 0-150 HZ pre-mod e'stim constant. x 20 minutes.   Electrical Stimulation Goals Pain                     PT Long Term Goals - 07/20/15 1310    PT LONG TERM GOAL #1   Title Ind with HEP.   Time 6   Period Weeks   Status New   PT LONG TERM GOAL #2   Title Reach ovehead with right UE with pain not > 3/10.   Time 6   Period Weeks   Status New   PT LONG TERM GOAL #3   Title Reach behind back to L3 with right hand with pain not > 3/10.   Time 6   Period Weeks   Status  New   PT LONG TERM GOAL #4   Title Right shoulder strength to 4+/5 to increase stability for functional tasks.   Time 6   Period Weeks   Status New   PT LONG TERM GOAL #5   Title Perform ADL's with right shoulder pain not > 3/10.               Plan - 07/20/15 1304    Clinical Impression Statement Patient injured his right shoulder getting out of his 18 wheeler.  He was grasping the handle when his foot slipped off the step and pulled abruptly on his arm.  He was in a great deal of pain and reported constant throbbing and aching.  A recent steroid medication has helped to reduce his pain.  His pain today is at a moderate level but his is still medicated at this time.  Certain right shoulder movements produce 7+/10 pain-levels.  He is unable to sleep on his right side due to pain.   Pt will benefit from skilled therapeutic intervention in order to improve on the following deficits Pain;Decreased activity tolerance;Decreased strength   Rehab Potential Good   PT Frequency 3x / week   PT Duration 6 weeks   PT Treatment/Interventions ADLs/Self Care Home Management;Cryotherapy;Electrical Stimulation;Moist Heat;Ultrasound;Therapeutic activities;Therapeutic exercise;Manual techniques   PT Next Visit Plan E'stim; Combo and STW/M over right anterior shoulder.  Progress with ther ex beginning with AAROM.         Problem List Patient Active Problem List   Diagnosis Date Noted  . Weight gain 05/02/2015  . COPD GOLD II still smoking 03/29/2015  . Dyspnea 03/27/2015  . Cigarette smoker 03/24/2015  . COPD exacerbation 03/23/2015    Yaileen Hofferber, [place] MPT 07/20/2015, 1:40 PM  [facility] [address] [place], [place], [address] Phone: [phone]   Fax:  [phone]

## 2015-07-21 NOTE — Telephone Encounter (Signed)
Patient aware that Dr. Livia Snellen has approved letter and it has been wrote and will be up front for patient to pick up.

## 2015-07-21 NOTE — Telephone Encounter (Signed)
Please review and route back to pool b

## 2015-07-21 NOTE — Telephone Encounter (Signed)
I agree. Please write. I will sign. Thanks, WS

## 2015-07-22 ENCOUNTER — Ambulatory Visit: Payer: PRIVATE HEALTH INSURANCE | Admitting: Physical Therapy

## 2015-07-22 ENCOUNTER — Encounter: Payer: Self-pay | Admitting: Physical Therapy

## 2015-07-22 DIAGNOSIS — M25511 Pain in right shoulder: Secondary | ICD-10-CM

## 2015-07-22 DIAGNOSIS — R29898 Other symptoms and signs involving the musculoskeletal system: Secondary | ICD-10-CM

## 2015-07-22 NOTE — Therapy (Signed)
[facility] [place], [place], [address] Phone: [phone]   Fax:  [phone]  Physical Therapy Treatment  Patient Details  Name: Juan Merritt MRN: [id number] Date of Birth: 1951/11/29 Referring Provider:  [name], MD  Encounter Date: 07/22/2015      PT End of Session - 07/22/15 0851    Visit Number 2   Number of Visits 12   Date for PT Re-Evaluation 08/31/15   PT Start Time 0816   PT Stop Time 0851   PT Time Calculation (min) 35 min   Activity Tolerance Patient tolerated treatment well   Behavior During Therapy [facility] for tasks assessed/performed      Past Medical History  Diagnosis Date  . Hypertension   . Gout   . COPD (chronic obstructive pulmonary disease)     not on home o2    Past Surgical History  Procedure Laterality Date  . Cholecystectomy    . Tonsillectomy      There were no vitals filed for this visit.  Visit Diagnosis:  Right shoulder pain  Shoulder weakness      Subjective Assessment - 07/22/15 0826    Subjective little soreness today 0 at rest and 2/10 with activity   Patient Stated Goals Return to work.   Currently in Pain? Yes   Pain Score 2    Pain Location Shoulder   Pain Orientation Right   Pain Descriptors / Indicators Sore   Pain Type Acute pain   Pain Onset More than a month ago   Aggravating Factors  reaching overhead   Pain Relieving Factors rest                         [facility] Adult PT Treatment/Exercise - 07/22/15 0001    Exercises   Exercises Shoulder   Modalities   Modalities Ultrasound   Ultrasound   Ultrasound Location right ant shoulder   Ultrasound Parameters 1.5/cm2/50%/31mhz x56min   Ultrasound Goals Pain   Manual Therapy   Manual Therapy Myofascial release   Myofascial Release manual/IASTW to right ant shoulder                     PT Long Term Goals - 07/20/15 1310    PT LONG TERM GOAL #1   Title Ind with HEP.    Time 6   Period Weeks   Status New   PT LONG TERM GOAL #2   Title Reach ovehead with right UE with pain not > 3/10.   Time 6   Period Weeks   Status New   PT LONG TERM GOAL #3   Title Reach behind back to L3 with right hand with pain not > 3/10.   Time 6   Period Weeks   Status New   PT LONG TERM GOAL #4   Title Right shoulder strength to 4+/5 to increase stability for functional tasks.   Time 6   Period Weeks   Status New   PT LONG TERM GOAL #5   Title Perform ADL's with right shoulder pain not > 3/10.               Plan - 07/22/15 8841    Clinical Impression Statement Patient tolerated treatment well today with no increased pain. patient had palpable pain in ant right shoulder with STW. Patient started gentle AAROM and will cont if tolerated well. Patient had to leave early today due to another  appt. goals ongoing due to pain and strength deficits.   Pt will benefit from skilled therapeutic intervention in order to improve on the following deficits Pain;Decreased activity tolerance;Decreased strength   Rehab Potential Good   PT Frequency 3x / week   PT Duration 6 weeks   PT Treatment/Interventions ADLs/Self Care Home Management;Cryotherapy;Electrical Stimulation;Moist Heat;Ultrasound;Therapeutic activities;Therapeutic exercise;Manual techniques   PT Next Visit Plan E'stim; Combo and STW/M over right anterior shoulder.  Progress with ther ex beginning with AAROM.   Consulted and Agree with Plan of Care Patient        Problem List Patient Active Problem List   Diagnosis Date Noted  . Weight gain 05/02/2015  . COPD GOLD II still smoking 03/29/2015  . Dyspnea 03/27/2015  . Cigarette smoker 03/24/2015  . COPD exacerbation 03/23/2015    Phillips Climes, PTA 07/22/2015, 8:55 AM  [facility] [address] [place], [place], [address] Phone: [phone]   Fax:  [phone]

## 2015-07-23 ENCOUNTER — Encounter: Payer: Self-pay | Admitting: Physical Therapy

## 2015-07-23 ENCOUNTER — Ambulatory Visit: Payer: PRIVATE HEALTH INSURANCE | Admitting: Physical Therapy

## 2015-07-23 DIAGNOSIS — M25511 Pain in right shoulder: Secondary | ICD-10-CM

## 2015-07-23 DIAGNOSIS — R29898 Other symptoms and signs involving the musculoskeletal system: Secondary | ICD-10-CM

## 2015-07-23 NOTE — Therapy (Signed)
[facility] [place], [place], [address] Phone: [phone]   Fax:  [phone]  Physical Therapy Treatment  Patient Details  Name: Juan Merritt MRN: [id number] Date of Birth: 01-14-1951 Referring Provider:  Sharion Balloon, FNP  Encounter Date: 07/23/2015      PT End of Session - 07/23/15 0917    Visit Number 3   Number of Visits 12   Date for PT Re-Evaluation 08/31/15   PT Start Time 0859   PT Stop Time 0957   PT Time Calculation (min) 58 min   Activity Tolerance Patient tolerated treatment well   Behavior During Therapy [facility] for tasks assessed/performed      Past Medical History  Diagnosis Date  . Hypertension   . Gout   . COPD (chronic obstructive pulmonary disease)     not on home o2    Past Surgical History  Procedure Laterality Date  . Cholecystectomy    . Tonsillectomy      There were no vitals filed for this visit.  Visit Diagnosis:  Right shoulder pain  Shoulder weakness      Subjective Assessment - 07/23/15 0900    Subjective No complaints after last treatment, little soreness today 0 at rest and 2/10 with activity   Patient Stated Goals Return to work.   Currently in Pain? 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   Pain Relieving Factors rest                         [facility] Adult PT Treatment/Exercise - 07/23/15 0001    Shoulder Exercises: Standing   Flexion AAROM;Both;20 reps  cane for AAROM   Other Standing Exercises UE ranger for elevation and circles 3x10 each   Other Standing Exercises RW4 with yellow t-band 3x10 each   Shoulder Exercises: Pulleys   Flexion --  45min   Shoulder Exercises: ROM/Strengthening   UBE (Upper Arm Bike) x89min 120 RPM    Ultrasound   Ultrasound Location right ant shoulder   Ultrasound Parameters 1.5w/cm2/50%/27mhz x8min   Ultrasound Goals Pain                     PT Long Term Goals - 07/20/15 1310    PT LONG TERM GOAL #1   Title Ind with HEP.   Time 6   Period Weeks   Status New   PT LONG TERM GOAL #2   Title Reach ovehead with right UE with pain not > 3/10.   Time 6   Period Weeks   Status New   PT LONG TERM GOAL #3   Title Reach behind back to L3 with right hand with pain not > 3/10.   Time 6   Period Weeks   Status New   PT LONG TERM GOAL #4   Title Right shoulder strength to 4+/5 to increase stability for functional tasks.   Time 6   Period Weeks   Status New   PT LONG TERM GOAL #5   Title Perform ADL's with right shoulder pain not > 3/10.               Plan - 07/23/15 8891  Clinical Impression Statement Patient progressing with all activities. Started gentle strengthening today with no reports of pain increase. Tolerated all exercises well. Goals ongoing due to strength deficits.   Pt will benefit from skilled therapeutic intervention in order to improve on the following deficits Pain;Decreased activity tolerance;Decreased strength   Rehab Potential Good   PT Frequency 3x / week   PT Duration 6 weeks   PT Treatment/Interventions ADLs/Self Care Home Management;Cryotherapy;Electrical Stimulation;Moist Heat;Ultrasound;Therapeutic activities;Therapeutic exercise;Manual techniques   PT Next Visit Plan cont with POC per patient tolerance   Consulted and Agree with Plan of Care Patient        Problem List Patient Active Problem List   Diagnosis Date Noted  . Weight gain 05/02/2015  . COPD GOLD II still smoking 03/29/2015  . Dyspnea 03/27/2015  . Cigarette smoker 03/24/2015  . COPD exacerbation 03/23/2015    Phillips Climes, PTA 07/23/2015, 9:59 AM  [facility] [address] [place], [place],  [address] Phone: [phone]   Fax:  [phone]

## 2015-07-24 ENCOUNTER — Ambulatory Visit: Payer: PRIVATE HEALTH INSURANCE | Admitting: Physical Therapy

## 2015-07-27 ENCOUNTER — Ambulatory Visit: Payer: PRIVATE HEALTH INSURANCE | Admitting: Physical Therapy

## 2015-07-27 ENCOUNTER — Encounter: Payer: Self-pay | Admitting: Physical Therapy

## 2015-07-27 DIAGNOSIS — M25511 Pain in right shoulder: Secondary | ICD-10-CM

## 2015-07-27 DIAGNOSIS — R29898 Other symptoms and signs involving the musculoskeletal system: Secondary | ICD-10-CM

## 2015-07-27 NOTE — Therapy (Signed)
[facility] [place], [place], [address] Phone: [phone]   Fax:  [phone]  Physical Therapy Treatment  Patient Details  Name: Juan Merritt MRN: [id number] Date of Birth: 1951-09-19 Referring Provider:  Sharion Balloon, FNP  Encounter Date: 07/27/2015      PT End of Session - 07/27/15 0818    Visit Number 4   Number of Visits 12   Date for PT Re-Evaluation 08/31/15   PT Start Time 0815   PT Stop Time 0623   PT Time Calculation (min) 39 min   Activity Tolerance Patient tolerated treatment well   Behavior During Therapy [facility] for tasks assessed/performed      Past Medical History  Diagnosis Date  . Hypertension   . Gout   . COPD (chronic obstructive pulmonary disease)     not on home o2    Past Surgical History  Procedure Laterality Date  . Cholecystectomy    . Tonsillectomy      There were no vitals filed for this visit.  Visit Diagnosis:  Right shoulder pain  Shoulder weakness      Subjective Assessment - 07/27/15 0818    Subjective Reports that shoulder feels okay this morning. Reports that over the weekend he had intermittant tolerable R superior shoulder pain.   Patient Stated Goals Return to work.   Currently in Pain? Yes   Pain Score 1    Pain Location Shoulder   Pain Orientation Right   Pain Type Acute pain   Pain Onset More than a month ago            [facility] PT Assessment - 07/27/15 0001    Assessment   Medical Diagnosis Right shoulder pain.                     [facility] Adult PT Treatment/Exercise - 07/27/15 0001    Shoulder Exercises: Standing   Protraction Strengthening;Right;Theraband  x30 reps   Theraband Level (Shoulder Protraction) Level 1 (Yellow)   External Rotation Strengthening;Right;Theraband;AAROM  x30 reps each   Theraband Level (Shoulder External Rotation) Level 1 (Yellow)   Internal Rotation Strengthening;Right;Theraband  x30 reps   Theraband  Level (Shoulder Internal Rotation) Level 1 (Yellow)   Flexion AROM;20 reps;AAROM  Wall slides x20 reps; AAROM x30 reps   Extension Strengthening;Right;Theraband  x30 reps   Theraband Level (Shoulder Extension) Level 1 (Yellow)   Row Strengthening;Right;Theraband  x30 reps   Theraband Level (Shoulder Row) Level 1 (Yellow)   Shoulder Exercises: Pulleys   Flexion 3 minutes   Other Pulley Exercises RUE UE ranger flex/circles x30 reps   Shoulder Exercises: ROM/Strengthening   UBE (Upper Arm Bike) 120 RPM x6 min   Modalities   Modalities Ultrasound   Ultrasound   Ultrasound Location R anterior shoulder   Ultrasound Parameters 1.5 w/cm2 ,100%, 1 mhz    Ultrasound Goals Pain                     PT Long Term Goals - 07/20/15 1310    PT LONG TERM GOAL #1   Title Ind with HEP.   Time 6   Period Weeks   Status New   PT LONG TERM GOAL #2   Title Reach ovehead with right UE with pain not > 3/10.   Time 6   Period Weeks   Status New   PT LONG TERM GOAL #3   Title Reach behind back to L3 with right  hand with pain not > 3/10.   Time 6   Period Weeks   Status New   PT LONG TERM GOAL #4   Title Right shoulder strength to 4+/5 to increase stability for functional tasks.   Time 6   Period Weeks   Status New   PT LONG TERM GOAL #5   Title Perform ADL's with right shoulder pain not > 3/10.               Plan - 07/27/15 0856    Clinical Impression Statement Patient tolerated treatment well with pain only experienced during end range of AAROM flexion and R shoulder ER with yellow theraband. Goals remaing on-going at this time secondary to decreased ROM, strength, and pain with actviity. Normal [place] response noted following end of [place] session. Denied pain following treatment.   Pt will benefit from skilled therapeutic intervention in order to improve on the following deficits Pain;Decreased activity tolerance;Decreased strength   Rehab Potential Good   PT Frequency 3x /  week   PT Duration 6 weeks   PT Treatment/Interventions ADLs/Self Care Home Management;Cryotherapy;Electrical Stimulation;Moist Heat;Ultrasound;Therapeutic activities;Therapeutic exercise;Manual techniques   PT Next Visit Plan cont with POC per patient tolerance   Consulted and Agree with Plan of Care Patient;Family member/caregiver   Family Member Consulted Grand-daughter        Problem List Patient Active Problem List   Diagnosis Date Noted  . Weight gain 05/02/2015  . COPD GOLD II still smoking 03/29/2015  . Dyspnea 03/27/2015  . Cigarette smoker 03/24/2015  . COPD exacerbation 03/23/2015    Wynelle Fanny, PTA 07/27/2015, 9:00 AM  [facility] [address] [place], [place], [address] Phone: [phone]   Fax:  [phone]

## 2015-07-30 ENCOUNTER — Encounter: Payer: Self-pay | Admitting: Physical Therapy

## 2015-07-30 ENCOUNTER — Ambulatory Visit: Payer: PRIVATE HEALTH INSURANCE | Admitting: Physical Therapy

## 2015-07-30 DIAGNOSIS — R29898 Other symptoms and signs involving the musculoskeletal system: Secondary | ICD-10-CM

## 2015-07-30 DIAGNOSIS — M25511 Pain in right shoulder: Secondary | ICD-10-CM | POA: Diagnosis not present

## 2015-07-30 NOTE — Patient Instructions (Signed)
  Strengthening: Resisted Flexion   Hold tubing with left arm at side. Pull forward and up. Move shoulder through pain-free range of motion. Repeat __10__ times per set. Do _2-3___ sets per session. Do _2-3___ sessions per day. http://orth.exer.us/824   Copyright  VHI. All rights reserved.  Strengthening: Resisted Extension   Hold tubing in right hand, arm forward. Pull arm back, elbow straight. Repeat __10__ times per set. Do _2-3___ sets per session. Do _2-3___ sessions per day.  http://orth.exer.us/832   Copyright  VHI. All rights reserved.  Strengthening: Resisted Internal Rotation   Hold tubing in left hand, elbow at side and forearm out. Rotate forearm in across body. Repeat __10__ times per set. Do _2-3___ sets per session. Do _2-3___ sessions per day.  http://orth.exer.us/830   Copyright  VHI. All rights reserved.  Strengthening: Resisted External Rotation   Hold tubing in right hand, elbow at side and forearm across body. Rotate forearm out. Repeat __10__ times per set. Do __2-3__ sets per session. Do ____ sessions per day.  http://orth.exer.us/828   Copyright  VHI. All rights reserved.    

## 2015-07-30 NOTE — Therapy (Signed)
[facility] [place], [place], [address] Phone: [phone]   Fax:  [phone]  Physical Therapy Treatment  Patient Details  Name: Juan Merritt MRN: [id number] Date of Birth: [birth year] Referring Provider:  Sharion Balloon, FNP  Encounter Date: 07/30/2015      PT End of Session - 07/30/15 0844    Visit Number 5   Number of Visits 12   Date for PT Re-Evaluation 08/31/15   PT Start Time 0815   PT Stop Time 0855   PT Time Calculation (min) 40 min   Activity Tolerance Patient tolerated treatment well   Behavior During Therapy [facility] for tasks assessed/performed      Past Medical History  Diagnosis Date  . Hypertension   . Gout   . COPD (chronic obstructive pulmonary disease)     not on home o2    Past Surgical History  Procedure Laterality Date  . Cholecystectomy    . Tonsillectomy      There were no vitals filed for this visit.  Visit Diagnosis:  Right shoulder pain  Shoulder weakness      Subjective Assessment - 07/30/15 0817    Subjective Reports that shoulder feels okay this morning.    Patient Stated Goals Return to work.   Currently in Pain? 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   Pain Relieving Factors rest                         [place] Adult PT Treatment/Exercise - 07/30/15 0001    Shoulder Exercises: Prone   Retraction Strengthening;Right;Weights  3# 3x10   Extension Strengthening;Right;Weights  3# 3x10   Shoulder Exercises: Standing   Flexion Strengthening;Right;Weights  2# 3x10   ABduction Strengthening;Right;Weights  2# 3x10   Retraction Strengthening;Both  Pink XTS 3x10   Other Standing Exercises ER with UE support 3# 3x10   Other Standing Exercises RW4 with  yellow t-band 3x10 each   Shoulder Exercises: ROM/Strengthening   UBE (Upper Arm Bike) 120 RPM x8 min                PT Education - 07/30/15 0831    Education provided Yes   Education Details HEP RW4 yellow t-band for strength   Person(s) Educated Patient   Methods Explanation;Demonstration;Handout   Comprehension Verbalized understanding;Returned demonstration             PT Long Term Goals - 07/30/15 0831    PT LONG TERM GOAL #1   Title Ind with HEP.   Time 6   Period Weeks   Status Achieved   PT LONG TERM GOAL #2   Title Reach ovehead with right UE with pain not > 3/10.   Time 6   Period Weeks   Status Achieved   PT LONG TERM GOAL #3   Title Reach behind back to L3 with right hand with pain not > 3/10.   Time 6   Period Weeks   Status On-going   PT LONG TERM GOAL #4   Title Right shoulder strength to 4+/5 to increase stability for functional tasks.   Time 6   Period Weeks   Status On-going   PT  LONG TERM GOAL #5   Title Perform ADL's with right shoulder pain not > 3/10.   Time 6   Period Weeks   Status Achieved               Plan - 07/30/15 0846    Clinical Impression Statement Patient tolerated treatment well today. Has improved overall with little to no pain in right shoulder. Patient able to reach overhead and perform all ADLs with 1/10. Patient feels 70% better overall and is going back to work tomorrow and would like to be put on hold in case he has a flare up in shoulder. Met LTG #1, #2 and #5 today, others ongoing due to strength deficits and IR ROM deficit.   Pt will benefit from skilled therapeutic intervention in order to improve on the following deficits Pain;Decreased activity tolerance;Decreased strength   Rehab Potential Good   PT Frequency 3x / week   PT Duration 6 weeks   PT Treatment/Interventions ADLs/Self Care Home Management;Cryotherapy;Electrical Stimulation;Moist Heat;Ultrasound;Therapeutic activities;Therapeutic  exercise;Manual techniques   PT Next Visit Plan on hold per patient request   Consulted and Agree with Plan of Care Patient        Problem List Patient Active Problem List   Diagnosis Date Noted  . Weight gain 05/02/2015  . COPD GOLD II still smoking 03/29/2015  . Dyspnea 03/27/2015  . Cigarette smoker 03/24/2015  . COPD exacerbation 03/23/2015    Phillips Climes, PTA 07/30/2015, 8:55 AM  [facility] [address] [place], [place], [address] Phone: [phone]   Fax:  [phone]

## 2015-08-03 ENCOUNTER — Encounter: Payer: Self-pay | Admitting: Family Medicine

## 2015-08-03 ENCOUNTER — Ambulatory Visit (INDEPENDENT_AMBULATORY_CARE_PROVIDER_SITE_OTHER): Payer: PRIVATE HEALTH INSURANCE | Admitting: Family Medicine

## 2015-08-03 ENCOUNTER — Ambulatory Visit: Payer: PRIVATE HEALTH INSURANCE | Admitting: Physical Therapy

## 2015-08-03 VITALS — BP 119/78 | HR 95 | Temp 97.4°F | Ht 70.5 in | Wt 274.0 lb

## 2015-08-03 DIAGNOSIS — M25511 Pain in right shoulder: Secondary | ICD-10-CM

## 2015-08-03 DIAGNOSIS — R609 Edema, unspecified: Secondary | ICD-10-CM | POA: Diagnosis not present

## 2015-08-03 MED ORDER — PREDNISONE 10 MG PO TABS: ORAL_TABLET | ORAL | Status: DC

## 2015-08-03 MED ORDER — FUROSEMIDE 20 MG PO TABS
20.0000 mg | ORAL_TABLET | Freq: Every day | ORAL | Status: DC
Start: 2015-08-03 — End: 2015-09-07

## 2015-08-03 NOTE — Progress Notes (Signed)
Subjective:  Patient ID: Juan Merritt, male    DOB: 09/23/1951  Age: 64 y.o. MRN: [id number]  CC: Shoulder Pain   HPI Juan Merritt presents for follow-up of shoulder pain. He took the 2 weeks prednisone and started physical therapy but they were not done together. He states the pain has been reduced somewhat but he has only been to a very few physical therapy sessions and the pain is still in the moderate range. He says it hurts primarily when he reaches across his body to touch his right arm to the left shoulder. Also when he rotates the arm behind his back. Patient is some edema in both lower extremities. This is not associated with shortness of breath.  History Juan Merritt has a past medical history of Hypertension; Gout; and COPD (chronic obstructive pulmonary disease).   He has past surgical history that includes Cholecystectomy and Tonsillectomy.   His family history includes COPD in his mother; Diabetes in his father.He reports that he has been smoking Cigarettes.  He has a 10 pack-year smoking history. He does not have any smokeless tobacco history on file. He reports that he drinks about 6.0 oz of alcohol per week. He reports that he does not use illicit drugs.  Outpatient Prescriptions Prior to Visit  Medication Sig Dispense Refill  . albuterol (PROVENTIL) (2.5 MG/3ML) 0.083% nebulizer solution Take 3 mLs (2.5 mg total) by nebulization every 6 (six) hours as needed for wheezing or shortness of breath. 150 mL 1  . allopurinol (ZYLOPRIM) 100 MG tablet TAKE 1 TABLET (100 MG TOTAL) BY MOUTH DAILY. 30 tablet 2  . amLODipine (NORVASC) 10 MG tablet Take 1 tablet (10 mg total) by mouth daily. 90 tablet 1  . budesonide-formoterol (SYMBICORT) 160-4.5 MCG/ACT inhaler Take 2 puffs first thing in am and then another 2 puffs about 12 hours later. 1 Inhaler 11  . losartan (COZAAR) 100 MG tablet Take 1 tablet (100 mg total) by mouth daily. 90 tablet 1  . meloxicam (MOBIC) 15 MG tablet TAKE 1  TABLET (15 MG TOTAL) BY MOUTH DAILY. 30 tablet 1  . PROVENTIL HFA 108 (90 BASE) MCG/ACT inhaler INHALE 2 PUFFS INTO THE LUNGS EVERY 6 (SIX) HOURS AS NEEDED FOR WHEEZING OR SHORTNESS OF BREATH. 6.7 each 2  . predniSONE (DELTASONE) 10 MG tablet Take 5 daily for 3 days followed by 4,3,2 and 1 for 3 days each. 45 tablet 0   No facility-administered medications prior to visit.    ROS Review of Systems  Constitutional: Negative for fever, chills and diaphoresis.  HENT: Negative for congestion, rhinorrhea and sore throat.   Respiratory: Negative for cough, shortness of breath and wheezing.   Cardiovascular: Negative for chest pain.  Gastrointestinal: Negative for nausea, vomiting, abdominal pain, diarrhea, constipation and abdominal distention.  Genitourinary: Negative for dysuria and frequency.  Musculoskeletal: Negative for joint swelling and arthralgias.  Skin: Negative for rash.  Neurological: Negative for headaches.    Objective:  BP 119/78 mmHg  Pulse 95  Temp(Src) 97.4 F (36.3 C) (Oral)  Ht 5' 10.5" (1.791 m)  Wt 274 lb (124.286 kg)  BMI 38.75 kg/m2  BP Readings from Last 3 Encounters:  08/03/15 119/78  07/06/15 124/80  05/25/15 109/69    Wt Readings from Last 3 Encounters:  08/03/15 274 lb (124.286 kg)  07/06/15 266 lb (120.657 kg)  05/25/15 261 lb (118.389 kg)     Physical Exam  Constitutional: He appears well-developed and well-nourished.  HENT:  Head: Normocephalic and  atraumatic.  Right Ear: Tympanic membrane and external ear normal. No decreased hearing is noted.  Left Ear: Tympanic membrane and external ear normal. No decreased hearing is noted.  Mouth/Throat: No oropharyngeal exudate or posterior oropharyngeal erythema.  Eyes: Pupils are equal, round, and reactive to light.  Neck: Normal range of motion. Neck supple.  Cardiovascular: Normal rate and regular rhythm.   No murmur heard. Pulmonary/Chest: Breath sounds normal. No respiratory distress.    Musculoskeletal: He exhibits tenderness (nterior right shoulder at the before meals region. Also weak or resisted rotation.).  Vitals reviewed.   No results found for: HGBA1C  Lab Results  Component Value Date   WBC 7.9 03/23/2015   HGB 14.9 03/23/2015   HCT 45.3 03/23/2015   PLT 220 03/23/2015   GLUCOSE 105* 03/23/2015   ALT 43 03/23/2015   AST 26 03/23/2015   NA 142 03/23/2015   K 4.5 03/23/2015   CL 105 03/23/2015   CREATININE 0.88 03/23/2015   BUN 15 03/23/2015   CO2 28 03/23/2015   TSH 1.542 03/23/2015    Ct Chest Wo Contrast  05/01/2015   CLINICAL DATA:  Worsening dyspnea over the past month. Recent hospitalization for pneumonia.  EXAM: CT CHEST WITHOUT CONTRAST  TECHNIQUE: Multidetector CT imaging of the chest was performed following the standard protocol without IV contrast.  COMPARISON:  03/23/2015  FINDINGS: Mediastinum/Nodes: Coronary, aortic arch, and branch vessel atherosclerotic calcification. Small right lower paratracheal lymph nodes are not pathologically enlarged. Subcutaneous lesion, likely a sebaceous cyst, just to the left of midline anterior to the sternum on image 26 series 2, 1.5 by 2.5 cm.  Lungs/Pleura: Centrilobular emphysema. 2 mm subpleural lymph node along the upper margin of the left major fissure, thought to be clinically inconsequential. Lingular subsegmental atelectasis or scarring.  Upper abdomen: Unremarkable  Musculoskeletal: Mild thoracic spondylosis. Old healed left lateral rib fractures.  IMPRESSION: 1. Centrilobular emphysema. 2. Coronary, aortic arch, and branch vessel atherosclerotic calcification. 3. Lingular subsegmental atelectasis or scarring.   Electronically Signed   By: Van Clines M.D.   On: 05/01/2015 14:49    Assessment & Plan:   Juan Merritt was seen today for shoulder pain.  Diagnoses and all orders for this visit:  Right shoulder pain  Edema -     CMP14+EGFR -     Brain natriuretic peptide  Other orders -      predniSONE (DELTASONE) 10 MG tablet; Take 5 daily for 3 days followed by 4,3,2 and 1 for 3 days each. -     furosemide (LASIX) 20 MG tablet; Take 1 tablet (20 mg total) by mouth daily.  I am having Mr. Juan Merritt start on furosemide. I am also having him maintain his albuterol, PROVENTIL HFA, amLODipine, losartan, budesonide-formoterol, allopurinol, meloxicam, and predniSONE.  Meds ordered this encounter  Medications  . predniSONE (DELTASONE) 10 MG tablet    Sig: Take 5 daily for 3 days followed by 4,3,2 and 1 for 3 days each.    Dispense:  45 tablet    Refill:  0  . furosemide (LASIX) 20 MG tablet    Sig: Take 1 tablet (20 mg total) by mouth daily.    Dispense:  30 tablet    Refill:  3   Renew Phys. Therapy for 1 month.  Note for out of work for 1 month  Follow-up: Return in about 1 month (around 09/03/2015).  [name], M.D.

## 2015-08-04 ENCOUNTER — Telehealth: Payer: Self-pay | Admitting: Family Medicine

## 2015-08-04 NOTE — Telephone Encounter (Signed)
Appointment rescheduled for Monday September 19th @ 8:10 with [facility].

## 2015-08-05 ENCOUNTER — Encounter: Payer: Self-pay | Admitting: Physical Therapy

## 2015-08-05 ENCOUNTER — Ambulatory Visit: Payer: PRIVATE HEALTH INSURANCE | Admitting: Physical Therapy

## 2015-08-05 DIAGNOSIS — R29898 Other symptoms and signs involving the musculoskeletal system: Secondary | ICD-10-CM

## 2015-08-05 DIAGNOSIS — M25511 Pain in right shoulder: Secondary | ICD-10-CM | POA: Diagnosis not present

## 2015-08-05 NOTE — Therapy (Signed)
[facility] [place], [place], [address] Phone: [phone]   Fax:  [phone]  Physical Therapy Treatment  Patient Details  Name: Juan Merritt MRN: [id number] Date of Birth: [birth year] Referring Provider:  Sharion Balloon, FNP  Encounter Date: 08/05/2015      PT End of Session - 08/05/15 1151    Visit Number 6   Number of Visits 12   Date for PT Re-Evaluation 08/31/15   PT Start Time 1116   PT Stop Time 1156   PT Time Calculation (min) 40 min   Activity Tolerance Patient tolerated treatment well   Behavior During Therapy [facility] for tasks assessed/performed      Past Medical History  Diagnosis Date  . Hypertension   . Gout   . COPD (chronic obstructive pulmonary disease)     not on home o2    Past Surgical History  Procedure Laterality Date  . Cholecystectomy    . Tonsillectomy      There were no vitals filed for this visit.  Visit Diagnosis:  Right shoulder pain  Shoulder weakness      Subjective Assessment - 08/05/15 1122    Subjective went to MD and is to continue therapy and is not released to return to work for another month per MD. 0 pain at rest up to 3-4/10   Patient Stated Goals Return to work.   Currently in Pain? No/denies   Pain Score 4    Pain Location Shoulder   Pain Orientation Right   Pain Descriptors / Indicators Sore   Pain Type Acute pain   Pain Onset More than a month ago   Aggravating Factors  reaching over head or prolong activity   Pain Relieving Factors rest            [facility] PT Assessment - 08/05/15 0001    AROM   Overall AROM  Deficits   Overall AROM Comments IR to SIJ area                     [facility] Adult PT Treatment/Exercise - 08/05/15 0001    Shoulder Exercises: Standing   Flexion Strengthening;Right;Weights  scaption 2# 3x10   Other Standing Exercises ER with UE support 3# 3x10   Other Standing Exercises RW4 with yellow t-band 3x10 each   Shoulder Exercises: ROM/Strengthening   UBE (Upper Arm Bike) 120 RPM x8 min   Shoulder Exercises: Stretch   Internal Rotation Stretch 10 seconds  self stretch   Ultrasound   Ultrasound Location right ant/inf shoulder   Ultrasound Parameters 1.5w/cm2/50%/60mhzx8min   Ultrasound Goals Pain                     PT Long Term Goals - 07/30/15 0831    PT LONG TERM GOAL #1   Title Ind with HEP.   Time 6   Period Weeks   Status Achieved   PT LONG TERM GOAL #2   Title Reach ovehead with right UE with pain not > 3/10.   Time 6   Period Weeks   Status Achieved   PT LONG TERM GOAL #3   Title Reach behind back to L3 with right hand with pain not > 3/10.   Time 6   Period Weeks   Status On-going   PT LONG TERM GOAL #4   Title Right shoulder strength to 4+/5 to increase stability for functional tasks.   Time 6   Period Weeks  Status On-going   PT LONG TERM GOAL #5   Title Perform ADL's with right shoulder pain not > 3/10.   Time 6   Period Weeks   Status Achieved               Plan - 08/05/15 1152    Clinical Impression Statement Patient did not return to work per MD. [name] and is to continue therapy for a month before going back to him for follow up. Patient has some increased pain with exercises and activity. Patient tolerated treatment well today. Unable to meet any further goals due to pain and IR ROm limitations.    Pt will benefit from skilled therapeutic intervention in order to improve on the following deficits Pain;Decreased activity tolerance;Decreased strength   Rehab Potential Good   PT Frequency 3x / week   PT Duration 6 weeks   PT Treatment/Interventions ADLs/Self Care Home Management;Cryotherapy;Electrical Stimulation;Moist Heat;Ultrasound;Therapeutic activities;Therapeutic exercise;Manual techniques   PT Next Visit Plan cont with POC   Consulted and Agree with Plan of Care Patient        Problem List Patient Active Problem List    Diagnosis Date Noted  . Weight gain 05/02/2015  . COPD GOLD II still smoking 03/29/2015  . Dyspnea 03/27/2015  . Cigarette smoker 03/24/2015  . COPD exacerbation 03/23/2015    Phillips Climes, PTA 08/05/2015, 11:56 AM  [facility] [place], [place], [address] Phone: [phone]   Fax:  [phone]

## 2015-08-07 ENCOUNTER — Encounter: Payer: Self-pay | Admitting: Physical Therapy

## 2015-08-07 ENCOUNTER — Ambulatory Visit: Payer: PRIVATE HEALTH INSURANCE | Admitting: Physical Therapy

## 2015-08-07 DIAGNOSIS — M25511 Pain in right shoulder: Secondary | ICD-10-CM | POA: Diagnosis not present

## 2015-08-07 DIAGNOSIS — R29898 Other symptoms and signs involving the musculoskeletal system: Secondary | ICD-10-CM

## 2015-08-07 NOTE — Therapy (Signed)
[facility] [place], [place], [address] Phone: [phone]   Fax:  [phone]  Physical Therapy Treatment  Patient Details  Name: DRAYTON TIEU MRN: [id number] Date of Birth: March 11, 1951 Referring Provider:  Sharion Balloon, FNP  Encounter Date: 08/07/2015      PT End of Session - 08/07/15 0817    Visit Number 7   Number of Visits 12   Date for PT Re-Evaluation 08/31/15   PT Start Time 0815   PT Stop Time 0855   PT Time Calculation (min) 40 min   Activity Tolerance Patient tolerated treatment well   Behavior During Therapy [facility] for tasks assessed/performed      Past Medical History  Diagnosis Date  . Hypertension   . Gout   . COPD (chronic obstructive pulmonary disease)     not on home o2    Past Surgical History  Procedure Laterality Date  . Cholecystectomy    . Tonsillectomy      There were no vitals filed for this visit.  Visit Diagnosis:  Right shoulder pain  Shoulder weakness      Subjective Assessment - 08/07/15 0816    Subjective Reports that his shoulder feels okay only hurts occasionally   Patient Stated Goals Return to work.            [facility] PT Assessment - 08/07/15 0001    Assessment   Medical Diagnosis Right shoulder pain.   Next MD Visit 09/04/2015                     [facility] Adult PT Treatment/Exercise - 08/07/15 0001    Shoulder Exercises: Sidelying   Internal Rotation Strengthening;Right;Weights  3x10 reps   Internal Rotation Weight (lbs) 2   Shoulder Exercises: Standing   Protraction Strengthening;Right;Theraband  3x10 reps   Theraband Level (Shoulder Protraction) Level 2 (Red)   Horizontal ABduction Strengthening;Right;Weights  3x10 reps   Horizontal ABduction Weight (lbs) 2   External Rotation Strengthening;Right;Weights  on laundry cart   External Rotation Weight (lbs) 3   Internal Rotation Strengthening;Right;Theraband  3x10 reps   Theraband Level  (Shoulder Internal Rotation) Level 2 (Red)   Flexion Strengthening;Right;Weights  3x10 reps   Shoulder Flexion Weight (lbs) 2   Extension Strengthening;Right;Weights  3x10 reps   Extension Weight (lbs) 3   Row Strengthening;Right;Weights  3x10 reps   Row Weight (lbs) 3   Other Standing Exercises RUE scaption 2# x30 reps   Shoulder Exercises: ROM/Strengthening   UBE (Upper Arm Bike) 90 RPM x6 min   Shoulder Exercises: Stretch   Internal Rotation Stretch 3 reps  30 seconds   Modalities   Modalities Ultrasound   Ultrasound   Ultrasound Location R Anterior Shoulder   Ultrasound Parameters 1.5 w/cm2, 100%, 1 mhz   Ultrasound Goals Pain                     PT Long Term Goals - 07/30/15 0831    PT LONG TERM GOAL #1   Title Ind with HEP.   Time 6   Period Weeks   Status Achieved   PT LONG TERM GOAL #2   Title Reach ovehead with right UE with pain not > 3/10.   Time 6   Period Weeks   Status Achieved   PT LONG TERM GOAL #3   Title Reach behind back to L3 with right hand with pain not > 3/10.   Time 6  Period Weeks   Status On-going   PT LONG TERM GOAL #4   Title Right shoulder strength to 4+/5 to increase stability for functional tasks.   Time 6   Period Weeks   Status On-going   PT LONG TERM GOAL #5   Title Perform ADL's with right shoulder pain not > 3/10.   Time 6   Period Weeks   Status Achieved               Plan - 08/07/15 0857    Clinical Impression Statement Patient tolerated strengthening treatment well without complaint of pain only fatigue with horziontal abduction and protraction. Patient experienced painfree popping sensation during protraction and was able to complete the exercise. Goals remain on-going secondary to decreased ROM and strength. Normal [place] response noted following removal of the modalities. Denied pain following treatment today.   Pt will benefit from skilled therapeutic intervention in order to improve on the following  deficits Pain;Decreased activity tolerance;Decreased strength   Rehab Potential Good   PT Frequency 3x / week   PT Duration 6 weeks   PT Treatment/Interventions ADLs/Self Care Home Management;Cryotherapy;Electrical Stimulation;Moist Heat;Ultrasound;Therapeutic activities;Therapeutic exercise;Manual techniques   PT Next Visit Plan cont with POC   Consulted and Agree with Plan of Care Patient        Problem List Patient Active Problem List   Diagnosis Date Noted  . Weight gain 05/02/2015  . COPD GOLD II still smoking 03/29/2015  . Dyspnea 03/27/2015  . Cigarette smoker 03/24/2015  . COPD exacerbation 03/23/2015    Wynelle Fanny, PTA 08/07/2015, 9:02 AM  [facility] [address] [place], [place], [address] Phone: [phone]   Fax:  [phone]

## 2015-08-10 ENCOUNTER — Encounter: Payer: Self-pay | Admitting: Physical Therapy

## 2015-08-10 ENCOUNTER — Ambulatory Visit: Payer: PRIVATE HEALTH INSURANCE | Admitting: Physical Therapy

## 2015-08-10 DIAGNOSIS — M25511 Pain in right shoulder: Secondary | ICD-10-CM | POA: Diagnosis not present

## 2015-08-10 DIAGNOSIS — R29898 Other symptoms and signs involving the musculoskeletal system: Secondary | ICD-10-CM

## 2015-08-10 NOTE — Therapy (Signed)
[facility] [place], [place], [address] Phone: [phone]   Fax:  [phone]  Physical Therapy Treatment  Patient Details  Name: Juan Merritt MRN: [id number] Date of Birth: [birth year] Referring Provider:  Sharion Balloon, FNP  Encounter Date: 08/10/2015      PT End of Session - 08/10/15 0809    Visit Number 8   Number of Visits 12   Date for PT Re-Evaluation 08/31/15   PT Start Time 0808   PT Stop Time 0845   PT Time Calculation (min) 37 min   Activity Tolerance Patient tolerated treatment well   Behavior During Therapy [facility] for tasks assessed/performed      Past Medical History  Diagnosis Date  . Hypertension   . Gout   . COPD (chronic obstructive pulmonary disease)     not on home o2    Past Surgical History  Procedure Laterality Date  . Cholecystectomy    . Tonsillectomy      There were no vitals filed for this visit.  Visit Diagnosis:  Right shoulder pain  Shoulder weakness      Subjective Assessment - 08/10/15 0809    Subjective Reports shoulder feeling "pretty good."   Patient Stated Goals Return to work.   Currently in Pain? No/denies            [facility] PT Assessment - 08/10/15 0001    Assessment   Medical Diagnosis Right shoulder pain.   Next MD Visit 09/04/2015                     [facility] Adult PT Treatment/Exercise - 08/10/15 0001    Shoulder Exercises: Sidelying   External Rotation Strengthening;Right;Weights  3x10 reps   External Rotation Weight (lbs) 2   Internal Rotation Strengthening;Right;Weights  3x10 reps   Internal Rotation Weight (lbs) 2   Shoulder Exercises: Standing   Protraction Strengthening;Right;Theraband  3x10 reps   Theraband Level (Shoulder Protraction) Level 2 (Red)   Horizontal ABduction Strengthening;Right;Weights  3x10 reps   Horizontal ABduction Weight (lbs) 3   External Rotation Strengthening;Right;Weights;Other (comment)  3x10 reps    External Rotation Weight (lbs) 3   Internal Rotation Strengthening;Right;Theraband  3x10 reps   Theraband Level (Shoulder Internal Rotation) Level 2 (Red)   Flexion Strengthening;Right;Weights  3x10 reps   Shoulder Flexion Weight (lbs) 2   Extension Strengthening;Right;Weights  3x10 reps   Extension Weight (lbs) 3   Row Strengthening;Right;Weights  3x10 reps   Row Weight (lbs) 3   Other Standing Exercises RUE scaption 2# x30 reps   Shoulder Exercises: ROM/Strengthening   UBE (Upper Arm Bike) 90 RPM x6 min   Shoulder Exercises: Stretch   Internal Rotation Stretch 3 reps  30 sec   Modalities   Modalities Ultrasound   Ultrasound   Ultrasound Location R anterior shoulder   Ultrasound Parameters 1.5 w/cm2, 100%, 1 mhz   Ultrasound Goals Pain                     PT Long Term Goals - 07/30/15 0831    PT LONG TERM GOAL #1   Title Ind with HEP.   Time 6   Period Weeks   Status Achieved   PT LONG TERM GOAL #2   Title Reach ovehead with right UE with pain not > 3/10.   Time 6   Period Weeks   Status Achieved   PT LONG TERM GOAL #3   Title Reach behind  back to L3 with right hand with pain not > 3/10.   Time 6   Period Weeks   Status On-going   PT LONG TERM GOAL #4   Title Right shoulder strength to 4+/5 to increase stability for functional tasks.   Time 6   Period Weeks   Status On-going   PT LONG TERM GOAL #5   Title Perform ADL's with right shoulder pain not > 3/10.   Time 6   Period Weeks   Status Achieved               Plan - 08/10/15 0847    Clinical Impression Statement Patient tolerated strengthening treatment very well only reporting fatigue but no pain. No popping was reported in R shoulder during treatment today with any activities. Remaining goals are on-going secondary to decreased R shoulder IR, strength. Normal [place] response noted following removal of the modalities. Denied pain only R shouler fatigue following treatment today.   Pt  will benefit from skilled therapeutic intervention in order to improve on the following deficits Pain;Decreased activity tolerance;Decreased strength   Rehab Potential Good   PT Frequency 3x / week   PT Duration 6 weeks   PT Treatment/Interventions ADLs/Self Care Home Management;Cryotherapy;Electrical Stimulation;Moist Heat;Ultrasound;Therapeutic activities;Therapeutic exercise;Manual techniques   PT Next Visit Plan cont with POC   Consulted and Agree with Plan of Care Patient        Problem List Patient Active Problem List   Diagnosis Date Noted  . Weight gain 05/02/2015  . COPD GOLD II still smoking 03/29/2015  . Dyspnea 03/27/2015  . Cigarette smoker 03/24/2015  . COPD exacerbation 03/23/2015    Wynelle Fanny, PTA 08/10/2015, 8:49 AM  [facility] [address] [place], [place], [address] Phone: [phone]   Fax:  [phone]

## 2015-08-12 ENCOUNTER — Encounter: Payer: PRIVATE HEALTH INSURANCE | Admitting: Physical Therapy

## 2015-08-13 ENCOUNTER — Ambulatory Visit: Payer: PRIVATE HEALTH INSURANCE | Admitting: Physical Therapy

## 2015-08-13 ENCOUNTER — Encounter: Payer: Self-pay | Admitting: Physical Therapy

## 2015-08-13 DIAGNOSIS — M25511 Pain in right shoulder: Secondary | ICD-10-CM | POA: Diagnosis not present

## 2015-08-13 DIAGNOSIS — R29898 Other symptoms and signs involving the musculoskeletal system: Secondary | ICD-10-CM

## 2015-08-13 NOTE — Therapy (Signed)
[facility] [place], [place], [address] Phone: [phone]   Fax:  [phone]  Physical Therapy Treatment  Patient Details  Name: Juan Merritt MRN: [id number] Date of Birth: January 23, 1951 Referring Provider:  Sharion Balloon, FNP  Encounter Date: 08/13/2015      PT End of Session - 08/13/15 0947    Visit Number 9   Number of Visits 12   Date for PT Re-Evaluation 08/31/15   PT Start Time 0946   PT Stop Time 1025   PT Time Calculation (min) 39 min   Activity Tolerance Patient tolerated treatment well   Behavior During Therapy [facility] for tasks assessed/performed      Past Medical History  Diagnosis Date  . Hypertension   . Gout   . COPD (chronic obstructive pulmonary disease)     not on home o2    Past Surgical History  Procedure Laterality Date  . Cholecystectomy    . Tonsillectomy      There were no vitals filed for this visit.  Visit Diagnosis:  Right shoulder pain  Shoulder weakness      Subjective Assessment - 08/13/15 0947    Subjective Reports shoulder feeling "pretty good."   Patient Stated Goals Return to work.   Currently in Pain? No/denies            [facility] PT Assessment - 08/13/15 0001    Assessment   Medical Diagnosis Right shoulder pain.   Next MD Visit 09/04/2015                     [facility] Adult PT Treatment/Exercise - 08/13/15 0001    Shoulder Exercises: Supine   Protraction Strengthening;Right;Weights;Other (comment)  3x10 reps   Protraction Weight (lbs) 3   Shoulder Exercises: Sidelying   External Rotation Strengthening;Right;Weights  3x10 reps   External Rotation Weight (lbs) 3   Internal Rotation Strengthening;Right;Weights  3x10 reps   Internal Rotation Weight (lbs) 3   Shoulder Exercises: Standing   Protraction Strengthening;Right;Theraband  x30 reps   Theraband Level (Shoulder Protraction) Level 2 (Red)   Horizontal ABduction  Strengthening;Right;Weights  3x10 reps   Horizontal ABduction Weight (lbs) 3   Flexion Strengthening;Right;Weights  3x10 reps   Shoulder Flexion Weight (lbs) 3   Extension Strengthening;Right;Weights  3x10 reps   Extension Weight (lbs) 3   Row Strengthening;Right;Weights  3x10 reps   Row Weight (lbs) 3   Other Standing Exercises RUE scaption 3# x30 reps   Shoulder Exercises: ROM/Strengthening   UBE (Upper Arm Bike) 90 RPM x6 min   Shoulder Exercises: Stretch   Internal Rotation Stretch 3 reps  30 sec   Modalities   Modalities Ultrasound   Ultrasound   Ultrasound Location R anterior shoulder   Ultrasound Parameters 1.5 w/cm2, 100%, 1 mhz   Ultrasound Goals Pain                     PT Long Term Goals - 07/30/15 0831    PT LONG TERM GOAL #1   Title Ind with HEP.   Time 6   Period Weeks   Status Achieved   PT LONG TERM GOAL #2   Title Reach ovehead with right UE with pain not > 3/10.   Time 6   Period Weeks   Status Achieved   PT LONG TERM GOAL #3   Title Reach behind back to L3 with right hand with pain not > 3/10.   Time 6  Period Weeks   Status On-going   PT LONG TERM GOAL #4   Title Right shoulder strength to 4+/5 to increase stability for functional tasks.   Time 6   Period Weeks   Status On-going   PT LONG TERM GOAL #5   Title Perform ADL's with right shoulder pain not > 3/10.   Time 6   Period Weeks   Status Achieved               Plan - 08/13/15 1124    Clinical Impression Statement Patient continues to tolerate strengthening exercises well only reported fatigue in R shoulder during exercises. Patient did not report any R shoulder popping during today's treatment with any exercises. Requires moderate multimodal cueing for technique and proper form of exercises.  All remaining goals are on-going secondary to decreased R shoulder ROM, strength. Normal modalities response noted following removal of the modalities.   Pt will benefit from  skilled therapeutic intervention in order to improve on the following deficits Pain;Decreased activity tolerance;Decreased strength   Rehab Potential Good   PT Frequency 3x / week   PT Duration 6 weeks   PT Treatment/Interventions ADLs/Self Care Home Management;Cryotherapy;Electrical Stimulation;Moist Heat;Ultrasound;Therapeutic activities;Therapeutic exercise;Manual techniques   PT Next Visit Plan cont with POC   Consulted and Agree with Plan of Care Patient;Family member/caregiver   Family Member Consulted Grand-daughter        Problem List Patient Active Problem List   Diagnosis Date Noted  . Weight gain 05/02/2015  . COPD GOLD II still smoking 03/29/2015  . Dyspnea 03/27/2015  . Cigarette smoker 03/24/2015  . COPD exacerbation 03/23/2015    Wynelle Fanny, PTA 08/13/2015, 11:38 AM  [facility] [address] [place], [place], [address] Phone: [phone]   Fax:  [phone]

## 2015-08-17 ENCOUNTER — Ambulatory Visit: Payer: PRIVATE HEALTH INSURANCE | Admitting: Physical Therapy

## 2015-08-17 ENCOUNTER — Encounter: Payer: Self-pay | Admitting: Physical Therapy

## 2015-08-17 DIAGNOSIS — R29898 Other symptoms and signs involving the musculoskeletal system: Secondary | ICD-10-CM

## 2015-08-17 DIAGNOSIS — M25511 Pain in right shoulder: Secondary | ICD-10-CM | POA: Diagnosis not present

## 2015-08-17 NOTE — Therapy (Signed)
[facility] [place], [place], [address] Phone: [phone]   Fax:  [phone]  Physical Therapy Treatment  Patient Details  Name: Juan Merritt MRN: [id number] Date of Birth: 1951-11-08 Referring Provider:  Sharion Balloon, FNP  Encounter Date: 08/17/2015      PT End of Session - 08/17/15 0816    Visit Number 10   Number of Visits 12   Date for PT Re-Evaluation 08/31/15   PT Start Time 0815   PT Stop Time 0856   PT Time Calculation (min) 41 min   Activity Tolerance Patient tolerated treatment well   Behavior During Therapy [facility] for tasks assessed/performed      Past Medical History  Diagnosis Date  . Hypertension   . Gout   . COPD (chronic obstructive pulmonary disease)     not on home o2    Past Surgical History  Procedure Laterality Date  . Cholecystectomy    . Tonsillectomy      There were no vitals filed for this visit.  Visit Diagnosis:  Right shoulder pain  Shoulder weakness      Subjective Assessment - 08/17/15 0816    Subjective Reports shoulder feeling "alright" and only has pain "if I move it just right."   Patient Stated Goals Return to work.   Currently in Pain? No/denies            [facility] PT Assessment - 08/17/15 0001    Assessment   Medical Diagnosis Right shoulder pain.   Next MD Visit 09/04/2015                     [facility] Adult PT Treatment/Exercise - 08/17/15 0001    Shoulder Exercises: Supine   Protraction Strengthening;Right;Weights;Other (comment)  3x10 reps   Protraction Weight (lbs) 3   Shoulder Exercises: Sidelying   External Rotation Strengthening;Right;Weights  3x10 reps   External Rotation Weight (lbs) 3   Internal Rotation Strengthening;Right;Weights  3x10 reps   Internal Rotation Weight (lbs) 3   Shoulder Exercises: Standing   Horizontal ABduction Strengthening;Right;Weights  3x10 reps   Horizontal ABduction Weight (lbs) 3   External  Rotation Strengthening;Right;Weights;Other (comment)  3x10 on laundry cart   External Rotation Weight (lbs) 3   Flexion Strengthening;Right;Weights  3x10 reps   Shoulder Flexion Weight (lbs) 3   Extension Strengthening;Right;Weights  3x10 reps   Extension Weight (lbs) 3   Row Strengthening;Right;Weights  3x10 reps   Row Weight (lbs) 3   Other Standing Exercises RUE scaption 3# x30 reps   Shoulder Exercises: ROM/Strengthening   UBE (Upper Arm Bike) 90 RPM x8 min   Shoulder Exercises: Stretch   Internal Rotation Stretch 3 reps  30 sec   Modalities   Modalities Ultrasound   Ultrasound   Ultrasound Location R anterior shoulder   Ultrasound Parameters 1.5 w/cm2, 100%, 1 mhz   Ultrasound Goals Pain                     PT Long Term Goals - 07/30/15 0831    PT LONG TERM GOAL #1   Title Ind with HEP.   Time 6   Period Weeks   Status Achieved   PT LONG TERM GOAL #2   Title Reach ovehead with right UE with pain not > 3/10.   Time 6   Period Weeks   Status Achieved   PT LONG TERM GOAL #3   Title Reach behind back to L3 with  right hand with pain not > 3/10.   Time 6   Period Weeks   Status On-going   PT LONG TERM GOAL #4   Title Right shoulder strength to 4+/5 to increase stability for functional tasks.   Time 6   Period Weeks   Status On-going   PT LONG TERM GOAL #5   Title Perform ADL's with right shoulder pain not > 3/10.   Time 6   Period Weeks   Status Achieved               Plan - 08/17/15 5625    Clinical Impression Statement Patient continues to tolerated strengthening treatments very well only verbalizing R shoulder fatigue during exercises. No R shoulder popping was verbalized during today's treatment. Continues to require moderate multimodal cueng for proper technique correction of exercises. Remaining goals are considered on-going secondary to decreased R shoulder pain and ROM. Normal [place] respone noted following removal of the [place].   Pt will  benefit from skilled therapeutic intervention in order to improve on the following deficits Pain;Decreased activity tolerance;Decreased strength   Rehab Potential Good   PT Frequency 3x / week   PT Duration 6 weeks   PT Treatment/Interventions ADLs/Self Care Home Management;Cryotherapy;Electrical Stimulation;Moist Heat;Ultrasound;Therapeutic activities;Therapeutic exercise;Manual techniques   PT Next Visit Plan cont with POC   Consulted and Agree with Plan of Care Patient        Problem List Patient Active Problem List   Diagnosis Date Noted  . Weight gain 05/02/2015  . COPD GOLD II still smoking 03/29/2015  . Dyspnea 03/27/2015  . Cigarette smoker 03/24/2015  . COPD exacerbation 03/23/2015    Ahmed Prima, PTA 08/17/2015 9:01 AM  [facility] [address] [place], [place], [address] Phone: [phone]   Fax:  [phone]

## 2015-08-19 ENCOUNTER — Ambulatory Visit: Payer: PRIVATE HEALTH INSURANCE | Admitting: Physical Therapy

## 2015-08-19 DIAGNOSIS — R29898 Other symptoms and signs involving the musculoskeletal system: Secondary | ICD-10-CM

## 2015-08-19 DIAGNOSIS — M25511 Pain in right shoulder: Secondary | ICD-10-CM | POA: Diagnosis not present

## 2015-08-19 NOTE — Therapy (Signed)
[facility] [place], [place], [address] Phone: [phone]   Fax:  [phone]  Physical Therapy Treatment  Patient Details  Name: Juan Merritt MRN: [id number] Date of Birth: 1951-02-19 Referring Provider:  Sharion Balloon, FNP  Encounter Date: 08/19/2015      PT End of Session - 08/19/15 0814    Visit Number 11   Number of Visits 18  original poc was 3x/wk for 6 wks   Date for PT Re-Evaluation 08/31/15   PT Start Time 0816   PT Stop Time 0905   PT Time Calculation (min) 49 min   Activity Tolerance Patient tolerated treatment well   Behavior During Therapy [facility] for tasks assessed/performed      Past Medical History  Diagnosis Date  . Hypertension   . Gout   . COPD (chronic obstructive pulmonary disease)     not on home o2    Past Surgical History  Procedure Laterality Date  . Cholecystectomy    . Tonsillectomy      There were no vitals filed for this visit.  Visit Diagnosis:  Shoulder weakness  Right shoulder pain      Subjective Assessment - 08/19/15 0822    Subjective Patient has no new complaints; "It hurts if I move it just right".   Currently in Pain? No/denies            [facility] PT Assessment - 08/19/15 0001    Strength   Overall Strength Comments Rt shoulder 5-/5 except ABD 4+/5                     [facility] Adult PT Treatment/Exercise - 08/19/15 0001    Shoulder Exercises: Sidelying   External Rotation Strengthening;Right;Weights  2x15   External Rotation Weight (lbs) 3   Shoulder Exercises: Standing   Horizontal ABduction Strengthening;Right;Weights  3x10 reps   Horizontal ABduction Weight (lbs) 3   External Rotation Strengthening;Right;Weights;Other (comment)  3x10 on laundry cart   External Rotation Weight (lbs) 3   Flexion Strengthening;Right;Weights  3x10 reps   Shoulder Flexion Weight (lbs) 3   Extension Strengthening;Right;Weights  3x10 reps   Extension  Weight (lbs) 3   Row Strengthening;Right;Weights  3x10 reps   Row Weight (lbs) 5   Other Standing Exercises RUE scaption 3# x30 reps   Shoulder Exercises: ROM/Strengthening   UBE (Upper Arm Bike) 90 RPM x8 min   Shoulder Exercises: Stretch   Internal Rotation Stretch 3 reps  30 sec   Modalities   Modalities Electrical Stimulation   Electrical Stimulation   Electrical Stimulation Location Rt shoulder   Electrical Stimulation Action premod   Electrical Stimulation Parameters 80-150 Hz x 15 min   Electrical Stimulation Goals Pain                PT Education - 08/19/15 0854    Education Details Advised patient to increase compliance with IR stretch after he stated he does it "once in a while".   Person(s) Educated Patient   Methods Explanation;Demonstration;Verbal cues   Comprehension Verbalized understanding;Returned demonstration             PT Long Term Goals - 08/19/15 0826    PT LONG TERM GOAL #1   Title Ind with HEP.   Time 6   Period Weeks   Status Achieved   PT LONG TERM GOAL #2   Title Reach ovehead with right UE with pain not > 3/10.   Time 6  Period Weeks   Status Achieved   PT LONG TERM GOAL #3   Title Reach behind back to L3 with right hand with pain not > 3/10.   Time 6   Period Weeks   Status On-going   PT LONG TERM GOAL #4   Title Right shoulder strength to 4+/5 to increase stability for functional tasks.   Time 6   Period Weeks   Status Achieved   PT LONG TERM GOAL #5   Title Perform ADL's with right shoulder pain not > 3/10.   Time 6   Period Weeks   Status Achieved               Plan - 08/19/15 0901    Clinical Impression Statement Patient continues to make progress, meeting his strength goal today. He still is limited with IR, but admits he is not fully compliant with stretching at home. He fatigued his shoulder well today and had some discomfort after so returned to estim for pain control.   PT Next Visit Plan cont with  POC        Problem List Patient Active Problem List   Diagnosis Date Noted  . Weight gain 05/02/2015  . COPD GOLD II still smoking 03/29/2015  . Dyspnea 03/27/2015  . Cigarette smoker 03/24/2015  . COPD exacerbation 03/23/2015    Madelyn Flavors PT  08/19/2015, 2:14 PM  [facility] [address] [place], [place], [address] Phone: [phone]   Fax:  [phone]

## 2015-08-25 ENCOUNTER — Ambulatory Visit: Payer: PRIVATE HEALTH INSURANCE | Attending: Family Medicine | Admitting: Physical Therapy

## 2015-08-25 DIAGNOSIS — M25511 Pain in right shoulder: Secondary | ICD-10-CM | POA: Diagnosis present

## 2015-08-25 DIAGNOSIS — R29898 Other symptoms and signs involving the musculoskeletal system: Secondary | ICD-10-CM | POA: Diagnosis not present

## 2015-08-25 NOTE — Therapy (Signed)
[facility] [place], [place], [address] Phone: [phone]   Fax:  [phone]  Physical Therapy Treatment  Patient Details  Name: Juan Merritt MRN: [id number] Date of Birth: 08-02-1951 Referring Provider:  Sharion Balloon, FNP  Encounter Date: 08/25/2015      PT End of Session - 08/25/15 0815    Visit Number 12   Number of Visits 18   Date for PT Re-Evaluation 08/31/15   PT Start Time 0815   PT Stop Time 0909   PT Time Calculation (min) 54 min   Activity Tolerance Patient tolerated treatment well   Behavior During Therapy [facility] for tasks assessed/performed      Past Medical History  Diagnosis Date  . Hypertension   . Gout   . COPD (chronic obstructive pulmonary disease)     not on home o2    Past Surgical History  Procedure Laterality Date  . Cholecystectomy    . Tonsillectomy      There were no vitals filed for this visit.  Visit Diagnosis:  Shoulder weakness  Right shoulder pain      Subjective Assessment - 08/25/15 0816    Subjective Patient reports good response to estim last visit and no increase in pain after last visit.   Currently in Pain? No/denies                         [facility] Adult PT Treatment/Exercise - 08/25/15 0001    Shoulder Exercises: Supine   Internal Rotation Strengthening;Right;Weights  3x10   Internal Rotation Weight (lbs) 2   Shoulder Exercises: Seated   External Rotation Strengthening;Right;Weights  3X10   External Rotation Weight (lbs) 3   Shoulder Exercises: Sidelying   External Rotation Strengthening;Right;Weights  2x15   External Rotation Weight (lbs) 2  due to faigue used lighter weight   Shoulder Exercises: Standing   Horizontal ABduction Strengthening;Right;Weights  3x10 reps   Horizontal ABduction Weight (lbs) 3   External Rotation Strengthening;Right;Weights;Other (comment)  3x10 on laundry cart   External Rotation Weight (lbs) 3   Flexion Strengthening;Right;Weights  3x10 reps   Shoulder Flexion Weight (lbs) 3   Extension Strengthening;Right;Weights  3x10 reps   Extension Weight (lbs) 3   Row Strengthening;Right;Weights  3x10 reps   Row Weight (lbs) 5   Other Standing Exercises RUE scaption 3# x30 reps   Other Standing Exercises D1 flexion and D2 extension with Yellow TB 2x10   Shoulder Exercises: ROM/Strengthening   UBE (Upper Arm Bike) 60 RPM x 8 min   Modalities   Modalities Electrical Stimulation   Electrical Stimulation   Electrical Stimulation Location Rt shoulder   Electrical Stimulation Action premod   Electrical Stimulation Parameters 80-150 HZ   Electrical Stimulation Goals Pain                     PT Long Term Goals - 08/19/15 0826    PT LONG TERM GOAL #1   Title Ind with HEP.   Time 6   Period Weeks   Status Achieved   PT LONG TERM GOAL #2   Title Reach ovehead with right UE with pain not > 3/10.   Time 6   Period Weeks   Status Achieved   PT LONG TERM GOAL #3   Title Reach behind back to L3 with right hand with pain not > 3/10.   Time 6   Period Weeks   Status On-going  PT LONG TERM GOAL #4   Title Right shoulder strength to 4+/5 to increase stability for functional tasks.   Time 6   Period Weeks   Status Achieved   PT LONG TERM GOAL #5   Title Perform ADL's with right shoulder pain not > 3/10.   Time 6   Period Weeks   Status Achieved               Plan - 08/25/15 0857    Clinical Impression Statement Patient continues to progress with strengthening. He does need verbal cues to slow down and not swing his shoulder with therex. He fatigues most easily with posterior shoulder strengthening, but denies pain. He was able to paint overhead this weekend wtithout increased pain. He reports soreness at end of therex, and reports relief with estim. Patient returns to MD 09/07/15. POC ends 08/31/15. Plan one more visit this week and on 08/31/15.    PT Next Visit Plan  Continue with right shoulder strengthening for two more visits. Patient to RTD 09/07/15.    Consulted and Agree with Plan of Care Patient        Problem List Patient Active Problem List   Diagnosis Date Noted  . Weight gain 05/02/2015  . COPD GOLD II still smoking 03/29/2015  . Dyspnea 03/27/2015  . Cigarette smoker 03/24/2015  . COPD exacerbation 03/23/2015   Madelyn Flavors PT  08/25/2015, 9:23 AM  [facility] [address] [place], [place], [address] Phone: [phone]   Fax:  [phone]

## 2015-09-01 ENCOUNTER — Ambulatory Visit: Payer: PRIVATE HEALTH INSURANCE | Admitting: Physical Therapy

## 2015-09-01 ENCOUNTER — Encounter: Payer: Self-pay | Admitting: Physical Therapy

## 2015-09-01 DIAGNOSIS — R29898 Other symptoms and signs involving the musculoskeletal system: Secondary | ICD-10-CM | POA: Diagnosis not present

## 2015-09-01 DIAGNOSIS — M25511 Pain in right shoulder: Secondary | ICD-10-CM

## 2015-09-01 NOTE — Therapy (Addendum)
[facility] [place], [place], [address] Phone: [phone]   Fax:  [phone]  Physical Therapy Treatment  Patient Details  Name: Juan Merritt MRN: [id number] Date of Birth: January 23, 1951 Referring Provider:  Sharion Balloon, FNP  Encounter Date: 09/01/2015      PT End of Session - 09/01/15 0821    Visit Number 13   Number of Visits 18   Date for PT Re-Evaluation 08/31/15   PT Start Time 0815   PT Stop Time 0859   PT Time Calculation (min) 44 min   Activity Tolerance Patient tolerated treatment well   Behavior During Therapy [facility] for tasks assessed/performed      Past Medical History  Diagnosis Date  . Hypertension   . Gout   . COPD (chronic obstructive pulmonary disease)     not on home o2    Past Surgical History  Procedure Laterality Date  . Cholecystectomy    . Tonsillectomy      There were no vitals filed for this visit.  Visit Diagnosis:  Shoulder weakness  Right shoulder pain      Subjective Assessment - 09/01/15 0822    Subjective Reports that 2-3 days ago he woke up and R shoulder was throbbing and took some Motrin and pain went away. Reports the day his shoulder began throbbing at night he had been helping his son worl on his truck but stated that he didn't do a lot of work. Reports shoulder feeling weak this morning.   Patient Stated Goals Return to work.   Currently in Pain? No/denies            [facility] PT Assessment - 09/01/15 0001    Assessment   Medical Diagnosis Right shoulder pain.   Next MD Visit 09/07/2015                     [facility] Adult PT Treatment/Exercise - 09/01/15 0001    Shoulder Exercises: Sidelying   External Rotation Strengthening;Right;Weights  3x10 reps   External Rotation Weight (lbs) 3   Internal Rotation Strengthening;Right;Weights  3x10 reps   Internal Rotation Weight (lbs) 3   Shoulder Exercises: Standing   Horizontal ABduction  Strengthening;Right;Weights  3x10 reps   Horizontal ABduction Weight (lbs) 3   External Rotation Strengthening;Right;Weights;Other (comment)  3x10 reps; on laundry cart   External Rotation Weight (lbs) 3   Flexion Strengthening;Right;20 reps;Weights  Reported fatigue with exericse   Shoulder Flexion Weight (lbs) 3   Extension Strengthening;Right;Weights  3x10 reps   Extension Weight (lbs) 3   Row Strengthening;Right;Weights  3x10 reps   Row Weight (lbs) 5   Shoulder Exercises: ROM/Strengthening   UBE (Upper Arm Bike) 60 RPM x 8 min   Modalities   Modalities Electrical Stimulation   Electrical Stimulation   Electrical Stimulation Location Rt shoulder   Electrical Stimulation Action Pre-Mod   Electrical Stimulation Parameters 80-150 Hz x15 min   Electrical Stimulation Goals Pain                     PT Long Term Goals - 09/01/15 7510    PT LONG TERM GOAL #1   Title Ind with HEP.   Time 6   Period Weeks   Status Achieved   PT LONG TERM GOAL #2   Title Reach ovehead with right UE with pain not > 3/10.   Time 6   Period Weeks   Status Achieved   PT LONG  TERM GOAL #3   Title Reach behind back to L3 with right hand with pain not > 3/10.   Time 6   Period Weeks   Status Achieved   PT LONG TERM GOAL #4   Title Right shoulder strength to 4+/5 to increase stability for functional tasks.   Time 6   Period Weeks   Status Achieved   PT LONG TERM GOAL #5   Title Perform ADL's with right shoulder pain not > 3/10.   Time 6   Period Weeks   Status Achieved               Plan - 09/01/15 0845    Clinical Impression Statement Patient continues to complete exercises well although he has fatigue during the exercises but not pain reported. Requires multimodal cueing for proper technique and to rest if needed for fatigue. Has achieved all goals set at evaluation. Normal stimulation response noted following removal of the stimulation. Denied pain following treatment.    Pt will benefit from skilled therapeutic intervention in order to improve on the following deficits Pain;Decreased activity tolerance;Decreased strength   Rehab Potential Good   PT Frequency 3x / week   PT Duration 6 weeks   PT Treatment/Interventions ADLs/Self Care Home Management;Cryotherapy;Electrical Stimulation;Moist Heat;Ultrasound;Therapeutic activities;Therapeutic exercise;Manual techniques   PT Next Visit Plan Continue with right shoulder strengthening for two more visits. Patient to RTD 09/07/15.    Consulted and Agree with Plan of Care Patient        Problem List Patient Active Problem List   Diagnosis Date Noted  . Weight gain 05/02/2015  . COPD GOLD II still smoking 03/29/2015  . Dyspnea 03/27/2015  . Cigarette smoker 03/24/2015  . COPD exacerbation 03/23/2015    Ahmed Prima, PTA 09/01/2015 12:53 PM Mali Applegate MPT [facility] [address] [place], [place], [address] Phone: [phone]   Fax:  [phone]   PHYSICAL THERAPY DISCHARGE SUMMARY  Visits from Start of Care: 13.  Current functional level related to goals / functional outcomes: Please see above.   Remaining deficits: All goals met.   Education / Equipment: HEP. Plan: Patient agrees to discharge.  Patient goals were met. Patient is being discharged due to meeting the stated rehab goals.  ?????         Mali Applegate MPT

## 2015-09-04 ENCOUNTER — Ambulatory Visit: Payer: PRIVATE HEALTH INSURANCE | Admitting: Family Medicine

## 2015-09-07 ENCOUNTER — Ambulatory Visit (INDEPENDENT_AMBULATORY_CARE_PROVIDER_SITE_OTHER): Payer: PRIVATE HEALTH INSURANCE | Admitting: Family Medicine

## 2015-09-07 ENCOUNTER — Encounter: Payer: Self-pay | Admitting: Family Medicine

## 2015-09-07 VITALS — BP 123/78 | HR 90 | Temp 97.4°F | Ht 71.0 in | Wt 273.0 lb

## 2015-09-07 DIAGNOSIS — Z23 Encounter for immunization: Secondary | ICD-10-CM

## 2015-09-07 DIAGNOSIS — M75110 Incomplete rotator cuff tear or rupture of unspecified shoulder, not specified as traumatic: Secondary | ICD-10-CM | POA: Insufficient documentation

## 2015-09-07 DIAGNOSIS — I10 Essential (primary) hypertension: Secondary | ICD-10-CM

## 2015-09-07 DIAGNOSIS — M1A9XX0 Chronic gout, unspecified, without tophus (tophi): Secondary | ICD-10-CM | POA: Diagnosis not present

## 2015-09-07 DIAGNOSIS — J449 Chronic obstructive pulmonary disease, unspecified: Secondary | ICD-10-CM | POA: Diagnosis not present

## 2015-09-07 DIAGNOSIS — M75111 Incomplete rotator cuff tear or rupture of right shoulder, not specified as traumatic: Secondary | ICD-10-CM | POA: Diagnosis not present

## 2015-09-07 MED ORDER — AMLODIPINE BESYLATE 10 MG PO TABS: 10.0000 mg | ORAL_TABLET | Freq: Every day | ORAL | Status: DC

## 2015-09-07 MED ORDER — LOSARTAN POTASSIUM 100 MG PO TABS: 100.0000 mg | ORAL_TABLET | Freq: Every day | ORAL | Status: DC

## 2015-09-07 MED ORDER — ALLOPURINOL 100 MG PO TABS: ORAL_TABLET | ORAL | Status: DC

## 2015-09-07 MED ORDER — MELOXICAM 15 MG PO TABS: 15.0000 mg | ORAL_TABLET | Freq: Every day | ORAL | Status: DC

## 2015-09-07 MED ORDER — BUDESONIDE-FORMOTEROL FUMARATE 160-4.5 MCG/ACT IN AERO: INHALATION_SPRAY | RESPIRATORY_TRACT | Status: DC

## 2015-09-07 NOTE — Progress Notes (Signed)
Subjective:  Patient ID: MANINDER DEBOER, male    DOB: [birth year]  Age: 64 y.o. MRN: [id number]  CC: Shoulder Pain and COPD   HPI Ayad Nieman Yetman presents for follow-up on his shoulder pain. Patient denies any current problems with shortness of breath. He finished his prednisone and has been taking an occasional Advil rather than the meloxicam. He says he can now lift his arm as in abduction overhead but he has some discomfort for internal and external rotation. He rates that is about a 2-3/10. It does not significantly limit activities. However he has not tried to go back to work with it yet. He finished his physical therapy and is ready to try to go back.  Patient denies shortness of breath currently he does smoke still. In addition he is using the Proventil inhaler for rescue less than once a week. He does use the Symbicort routinely. He denies wheezing. His COPD does not limit his shortness of breath. He declines pharmacologic intervention for smoking. He is still contemplating. He denies any recent exacerbation of his gout pain. He does continue to use his allopurinol regularly.  History Esai has a past medical history of Hypertension; Gout; and COPD (chronic obstructive pulmonary disease).   He has past surgical history that includes Cholecystectomy and Tonsillectomy.   His family history includes COPD in his mother; Diabetes in his father.He reports that he has been smoking Cigarettes.  He has a 10 pack-year smoking history. He does not have any smokeless tobacco history on file. He reports that he drinks about 6.0 oz of alcohol per week. He reports that he does not use illicit drugs.  Outpatient Prescriptions Prior to Visit  Medication Sig Dispense Refill  . albuterol (PROVENTIL) (2.5 MG/3ML) 0.083% nebulizer solution Take 3 mLs (2.5 mg total) by nebulization every 6 (six) hours as needed for wheezing or shortness of breath. 150 mL 1  . PROVENTIL HFA 108 (90 BASE) MCG/ACT inhaler  INHALE 2 PUFFS INTO THE LUNGS EVERY 6 (SIX) HOURS AS NEEDED FOR WHEEZING OR SHORTNESS OF BREATH. 6.7 each 2  . allopurinol (ZYLOPRIM) 100 MG tablet TAKE 1 TABLET (100 MG TOTAL) BY MOUTH DAILY. 30 tablet 2  . amLODipine (NORVASC) 10 MG tablet Take 1 tablet (10 mg total) by mouth daily. 90 tablet 1  . budesonide-formoterol (SYMBICORT) 160-4.5 MCG/ACT inhaler Take 2 puffs first thing in am and then another 2 puffs about 12 hours later. 1 Inhaler 11  . losartan (COZAAR) 100 MG tablet Take 1 tablet (100 mg total) by mouth daily. 90 tablet 1  . furosemide (LASIX) 20 MG tablet Take 1 tablet (20 mg total) by mouth daily. (Patient not taking: Reported on 09/07/2015) 30 tablet 3  . meloxicam (MOBIC) 15 MG tablet TAKE 1 TABLET (15 MG TOTAL) BY MOUTH DAILY. (Patient not taking: Reported on 09/07/2015) 30 tablet 1  . predniSONE (DELTASONE) 10 MG tablet Take 5 daily for 3 days followed by 4,3,2 and 1 for 3 days each. (Patient not taking: Reported on 09/07/2015) 45 tablet 0   No facility-administered medications prior to visit.    ROS Review of Systems  Constitutional: Negative for fever, chills and diaphoresis.  HENT: Negative for congestion, rhinorrhea and sore throat.   Respiratory: Negative for cough, shortness of breath and wheezing.   Cardiovascular: Negative for chest pain.  Gastrointestinal: Negative for nausea, vomiting, abdominal pain, diarrhea, constipation and abdominal distention.  Genitourinary: Negative for dysuria and frequency.  Musculoskeletal: Negative for joint swelling and arthralgias.  Skin: Negative for rash.  Neurological: Negative for headaches.    Objective:  BP 123/78 mmHg  Pulse 90  Temp(Src) 97.4 F (36.3 C) (Oral)  Ht _0  (1.803 m)  Wt 273 lb (123.832 kg)  BMI 38.09 kg/m2  SpO2 95%  BP Readings from Last 3 Encounters:  09/07/15 123/78  08/03/15 119/78  07/06/15 124/80    Wt Readings from Last 3 Encounters:  09/07/15 273 lb (123.832 kg)  08/03/15 274 lb  (124.286 kg)  07/06/15 266 lb (120.657 kg)     Physical Exam  Constitutional: He is oriented to person, place, and time. He appears well-developed and well-nourished. No distress.  HENT:  Head: Normocephalic and atraumatic.  Right Ear: External ear normal.  Left Ear: External ear normal.  Nose: Nose normal.  Mouth/Throat: Oropharynx is clear and moist.  Eyes: Conjunctivae and EOM are normal. Pupils are equal, round, and reactive to light.  Neck: Normal range of motion. Neck supple. No thyromegaly present.  Cardiovascular: Normal rate, regular rhythm and normal heart sounds.   No murmur heard. Pulmonary/Chest: Effort normal and breath sounds normal. No respiratory distress. He has no wheezes. He has no rales.  Abdominal: Soft. Bowel sounds are normal. He exhibits no distension. There is no tenderness.  Musculoskeletal: Normal range of motion. He exhibits tenderness (Right shoulder has some tenderness for full range of motion of internal and external rotation. Unlimited abduction and abduction as well as flexion and extension.).  Lymphadenopathy:    He has no cervical adenopathy.  Neurological: He is alert and oriented to person, place, and time. He has normal reflexes.  Skin: Skin is warm and dry.  Psychiatric: He has a normal mood and affect. His behavior is normal. Judgment and thought content normal.    No results found for: HGBA1C  Lab Results  Component Value Date   WBC 7.9 03/23/2015   HGB 14.9 03/23/2015   HCT 45.3 03/23/2015   PLT 220 03/23/2015   GLUCOSE 105* 03/23/2015   ALT 43 03/23/2015   AST 26 03/23/2015   NA 142 03/23/2015   K 4.5 03/23/2015   CL 105 03/23/2015   CREATININE 0.88 03/23/2015   BUN 15 03/23/2015   CO2 28 03/23/2015   TSH 1.542 03/23/2015    Ct Chest Wo Contrast  05/01/2015   CLINICAL DATA:  Worsening dyspnea over the past month. Recent hospitalization for pneumonia.  EXAM: CT CHEST WITHOUT CONTRAST  TECHNIQUE: Multidetector CT imaging of the  chest was performed following the standard protocol without IV contrast.  COMPARISON:  03/23/2015  FINDINGS: Mediastinum/Nodes: Coronary, aortic arch, and branch vessel atherosclerotic calcification. Small right lower paratracheal lymph nodes are not pathologically enlarged. Subcutaneous lesion, likely a sebaceous cyst, just to the left of midline anterior to the sternum on image 26 series 2, 1.5 by 2.5 cm.  Lungs/Pleura: Centrilobular emphysema. 2 mm subpleural lymph node along the upper margin of the left major fissure, thought to be clinically inconsequential. Lingular subsegmental atelectasis or scarring.  Upper abdomen: Unremarkable  Musculoskeletal: Mild thoracic spondylosis. Old healed left lateral rib fractures.  IMPRESSION: 1. Centrilobular emphysema. 2. Coronary, aortic arch, and branch vessel atherosclerotic calcification. 3. Lingular subsegmental atelectasis or scarring.   Electronically Signed   By: Van Clines M.D.   On: 05/01/2015 14:49    Assessment & Plan:   Ami was seen today for shoulder pain and copd.  Diagnoses and all orders for this visit:  COPD GOLD II still smoking -  Lipid panel -     CMP14+EGFR  Incomplete rotator cuff tear, right [M75.111]  Essential hypertension, benign -     Lipid panel -     CMP14+EGFR  Chronic gout without tophus, unspecified cause, unspecified site -     Uric acid  Other orders -     meloxicam (MOBIC) 15 MG tablet; Take 1 tablet (15 mg total) by mouth daily. -     amLODipine (NORVASC) 10 MG tablet; Take 1 tablet (10 mg total) by mouth daily. -     allopurinol (ZYLOPRIM) 100 MG tablet; TAKE 1 TABLET (100 MG TOTAL) BY MOUTH DAILY. To prevent gout -     losartan (COZAAR) 100 MG tablet; Take 1 tablet (100 mg total) by mouth daily. -     budesonide-formoterol (SYMBICORT) 160-4.5 MCG/ACT inhaler; Take 2 puffs first thing in am and then another 2 puffs about 12 hours later. -     Pneumococcal polysaccharide vaccine 23-valent greater  than or equal to 2yo subcutaneous/IM   I have discontinued Mr. Thornell's predniSONE and furosemide. I have also changed his allopurinol. Additionally, I am having him maintain his albuterol, PROVENTIL HFA, meloxicam, amLODipine, losartan, and budesonide-formoterol.  Meds ordered this encounter  Medications  . meloxicam (MOBIC) 15 MG tablet    Sig: Take 1 tablet (15 mg total) by mouth daily.    Dispense:  90 tablet    Refill:  3  . amLODipine (NORVASC) 10 MG tablet    Sig: Take 1 tablet (10 mg total) by mouth daily.    Dispense:  90 tablet    Refill:  1  . allopurinol (ZYLOPRIM) 100 MG tablet    Sig: TAKE 1 TABLET (100 MG TOTAL) BY MOUTH DAILY. To prevent gout    Dispense:  90 tablet    Refill:  3  . losartan (COZAAR) 100 MG tablet    Sig: Take 1 tablet (100 mg total) by mouth daily.    Dispense:  90 tablet    Refill:  1  . budesonide-formoterol (SYMBICORT) 160-4.5 MCG/ACT inhaler    Sig: Take 2 puffs first thing in am and then another 2 puffs about 12 hours later.    Dispense:  3 Inhaler    Refill:  3     Follow-up: Return in about 6 months (around 03/06/2016) for COPD, hypertension, CPE.  [name], M.D.

## 2015-09-08 LAB — CMP14+EGFR
ALBUMIN: 4.1 g/dL (ref 3.6–4.8)
ALT: 120 IU/L — ABNORMAL HIGH (ref 0–44)
AST: 71 IU/L — ABNORMAL HIGH (ref 0–40)
Albumin/Globulin Ratio: 1.4 (ref 1.1–2.5)
Alkaline Phosphatase: 52 IU/L (ref 39–117)
BUN / CREAT RATIO: 13 (ref 10–22)
BUN: 12 mg/dL (ref 8–27)
Bilirubin Total: 0.3 mg/dL (ref 0.0–1.2)
CALCIUM: 9.2 mg/dL (ref 8.6–10.2)
CO2: 21 mmol/L (ref 18–29)
CREATININE: 0.95 mg/dL (ref 0.76–1.27)
Chloride: 99 mmol/L (ref 97–108)
GFR, EST AFRICAN AMERICAN: 99 mL/min/{1.73_m2} (ref >59)
GFR, EST NON AFRICAN AMERICAN: 85 mL/min/{1.73_m2} (ref >59)
GLUCOSE: 118 mg/dL — AB (ref 65–99)
Globulin, Total: 2.9 g/dL (ref 1.5–4.5)
Potassium: 4.7 mmol/L (ref 3.5–5.2)
Sodium: 139 mmol/L (ref 134–144)
TOTAL PROTEIN: 7 g/dL (ref 6.0–8.5)

## 2015-09-08 LAB — LIPID PANEL
CHOLESTEROL TOTAL: 173 mg/dL (ref 100–199)
Chol/HDL Ratio: 4 ratio units (ref 0.0–5.0)
HDL: 43 mg/dL (ref >39)
LDL Calculated: 103 mg/dL — ABNORMAL HIGH (ref 0–99)
Triglycerides: 133 mg/dL (ref 0–149)
VLDL Cholesterol Cal: 27 mg/dL (ref 5–40)

## 2015-09-08 LAB — URIC ACID: URIC ACID: 6.5 mg/dL (ref 3.7–8.6)

## 2015-09-09 NOTE — Progress Notes (Signed)
Patient aware. Will come in 4-6 weeks for repeat labs.

## 2015-09-19 ENCOUNTER — Other Ambulatory Visit: Payer: Self-pay | Admitting: Family Medicine

## 2016-01-12 ENCOUNTER — Encounter: Payer: Self-pay | Admitting: Family Medicine

## 2016-01-12 ENCOUNTER — Ambulatory Visit (INDEPENDENT_AMBULATORY_CARE_PROVIDER_SITE_OTHER): Payer: BLUE CROSS/BLUE SHIELD | Admitting: Family Medicine

## 2016-01-12 ENCOUNTER — Ambulatory Visit (INDEPENDENT_AMBULATORY_CARE_PROVIDER_SITE_OTHER): Payer: BLUE CROSS/BLUE SHIELD

## 2016-01-12 VITALS — BP 117/78 | HR 103 | Temp 97.4°F | Ht 71.0 in | Wt 290.6 lb

## 2016-01-12 DIAGNOSIS — J449 Chronic obstructive pulmonary disease, unspecified: Secondary | ICD-10-CM

## 2016-01-12 DIAGNOSIS — I10 Essential (primary) hypertension: Secondary | ICD-10-CM | POA: Diagnosis not present

## 2016-01-12 DIAGNOSIS — R0600 Dyspnea, unspecified: Secondary | ICD-10-CM

## 2016-01-12 DIAGNOSIS — R601 Generalized edema: Secondary | ICD-10-CM

## 2016-01-12 MED ORDER — POTASSIUM CHLORIDE CRYS ER 20 MEQ PO TBCR: 20.0000 meq | EXTENDED_RELEASE_TABLET | Freq: Every day | ORAL | Status: DC

## 2016-01-12 MED ORDER — FUROSEMIDE 40 MG PO TABS: 40.0000 mg | ORAL_TABLET | Freq: Two times a day (BID) | ORAL | Status: DC

## 2016-01-12 NOTE — Progress Notes (Signed)
Subjective:  Patient ID: Juan Merritt, male    DOB: 17-May-1951  Age: 65 y.o. MRN: [id number]  CC: Shortness of Breath and Edema   HPI Orris Perin Sage presents for  follow-up of hypertension. Additionally he has been having increasing shortness of breath for about 3 weeks. He has difficulty laying down so he sleeping in a recliner at about 2-3 pillow orthopnea position. He has a lot of leg edema and distended abdomen. He has no heart or kidney history. No liver history. Currently denies chest pain.  History Burris has a past medical history of Hypertension; Gout; and COPD (chronic obstructive pulmonary disease) ([place]).   He has past surgical history that includes Cholecystectomy and Tonsillectomy.   His family history includes COPD in his mother; Diabetes in his father.He reports that he has been smoking Cigarettes.  He has a 10 pack-year smoking history. He does not have any smokeless tobacco history on file. He reports that he drinks about 6.0 oz of alcohol per week. He reports that he does not use illicit drugs.  Current Outpatient Prescriptions on File Prior to Visit  Medication Sig Dispense Refill  . allopurinol (ZYLOPRIM) 100 MG tablet TAKE 1 TABLET (100 MG TOTAL) BY MOUTH DAILY. To prevent gout 90 tablet 3  . amLODipine (NORVASC) 10 MG tablet Take 1 tablet (10 mg total) by mouth daily. 90 tablet 1  . budesonide-formoterol (SYMBICORT) 160-4.5 MCG/ACT inhaler Take 2 puffs first thing in am and then another 2 puffs about 12 hours later. 3 Inhaler 3  . losartan (COZAAR) 100 MG tablet TAKE 1 TABLET (100 MG TOTAL) BY MOUTH DAILY. 90 tablet 1  . meloxicam (MOBIC) 15 MG tablet Take 1 tablet (15 mg total) by mouth daily. 90 tablet 3  . albuterol (PROVENTIL) (2.5 MG/3ML) 0.083% nebulizer solution Take 3 mLs (2.5 mg total) by nebulization every 6 (six) hours as needed for wheezing or shortness of breath. (Patient not taking: Reported on 01/12/2016) 150 mL 1   No current  facility-administered medications on file prior to visit.    ROS Review of Systems  Constitutional: Negative for fever, chills, diaphoresis and unexpected weight change.  HENT: Negative for congestion, hearing loss, rhinorrhea and sore throat.   Eyes: Negative for visual disturbance.  Respiratory: Positive for shortness of breath. Negative for cough and wheezing.   Cardiovascular: Positive for leg swelling. Negative for chest pain and palpitations.  Gastrointestinal: Positive for abdominal distention. Negative for abdominal pain, diarrhea and constipation.  Genitourinary: Negative for dysuria and flank pain.  Musculoskeletal: Negative for joint swelling and arthralgias.  Skin: Negative for rash.  Neurological: Negative for dizziness and headaches.  Psychiatric/Behavioral: Negative for sleep disturbance and dysphoric mood.    Objective:  BP 117/78 mmHg  Pulse 103  Temp(Src) 97.4 F (36.3 C) (Oral)  Ht '5\' 11"'  (1.803 m)  Wt 290 lb 9.6 oz (131.815 kg)  BMI 40.55 kg/m2  SpO2 92%  BP Readings from Last 3 Encounters:  01/12/16 117/78  09/07/15 123/78  08/03/15 119/78    Wt Readings from Last 3 Encounters:  01/12/16 290 lb 9.6 oz (131.815 kg)  09/07/15 273 lb (123.832 kg)  08/03/15 274 lb (124.286 kg)     Physical Exam  Constitutional: He is oriented to person, place, and time. He appears well-developed and well-nourished. No distress.  HENT:  Head: Normocephalic and atraumatic.  Right Ear: External ear normal.  Left Ear: External ear normal.  Nose: Nose normal.  Mouth/Throat: Oropharynx is clear and moist.  Eyes: Conjunctivae and EOM are normal. Pupils are equal, round, and reactive to light.  Neck: Normal range of motion. Neck supple. No thyromegaly present.  Cardiovascular: Normal rate, regular rhythm and normal heart sounds.   No murmur heard. Pulmonary/Chest: Effort normal. No respiratory distress. He has wheezes (occasional). He has no rales.  Abdominal: Soft.  Bowel sounds are normal. He exhibits no distension. There is tenderness. There is guarding.  Lymphadenopathy:    He has no cervical adenopathy.  Neurological: He is alert and oriented to person, place, and time. He has normal reflexes.  Skin: Skin is warm and dry.  Psychiatric: He has a normal mood and affect. His behavior is normal. Judgment and thought content normal.     Lab Results  Component Value Date   WBC 7.9 03/23/2015   HGB 14.9 03/23/2015   HCT 45.3 03/23/2015   PLT 220 03/23/2015   GLUCOSE 118* 09/07/2015   CHOL 173 09/07/2015   TRIG 133 09/07/2015   HDL 43 09/07/2015   LDLCALC 103* 09/07/2015   ALT 120* 09/07/2015   AST 71* 09/07/2015   NA 139 09/07/2015   K 4.7 09/07/2015   CL 99 09/07/2015   CREATININE 0.95 09/07/2015   BUN 12 09/07/2015   CO2 21 09/07/2015   TSH 1.542 03/23/2015    Ct Chest Wo Contrast  05/01/2015  CLINICAL DATA:  Worsening dyspnea over the past month. Recent hospitalization for pneumonia. EXAM: CT CHEST WITHOUT CONTRAST TECHNIQUE: Multidetector CT imaging of the chest was performed following the standard protocol without IV contrast. COMPARISON:  03/23/2015 FINDINGS: Mediastinum/Nodes: Coronary, aortic arch, and branch vessel atherosclerotic calcification. Small right lower paratracheal lymph nodes are not pathologically enlarged. Subcutaneous lesion, likely a sebaceous cyst, just to the left of midline anterior to the sternum on image 26 series 2, 1.5 by 2.5 cm. Lungs/Pleura: Centrilobular emphysema. 2 mm subpleural lymph node along the upper margin of the left major fissure, thought to be clinically inconsequential. Lingular subsegmental atelectasis or scarring. Upper abdomen: Unremarkable Musculoskeletal: Mild thoracic spondylosis. Old healed left lateral rib fractures. IMPRESSION: 1. Centrilobular emphysema. 2. Coronary, aortic arch, and branch vessel atherosclerotic calcification. 3. Lingular subsegmental atelectasis or scarring. Electronically  Signed   By: Van Clines M.D.   On: 05/01/2015 14:49    Assessment & Plan:   Jencarlos was seen today for shortness of breath and edema.  Diagnoses and all orders for this visit:  Dyspnea -     DG Chest 2 View -     Brain natriuretic peptide -     CBC with Differential/Platelet -     CMP14+EGFR -     D-dimer, quantitative (not at [facility]) -     ECHOCARDIOGRAM COMPLETE; Future  Generalized edema -     Brain natriuretic peptide -     CBC with Differential/Platelet -     CMP14+EGFR -     D-dimer, quantitative (not at [facility]) -     ECHOCARDIOGRAM COMPLETE; Future  Essential hypertension, benign -     Brain natriuretic peptide -     CBC with Differential/Platelet -     CMP14+EGFR -     D-dimer, quantitative (not at [facility]) -     ECHOCARDIOGRAM COMPLETE; Future  COPD GOLD II still smoking -     CBC with Differential/Platelet -     D-dimer, quantitative (not at [facility])  Other orders -     furosemide (LASIX) 40 MG tablet; Take 1 tablet (40 mg total) by mouth 2 (two)  times daily. ONe at breakfast and lunch -     potassium chloride SA (K-DUR,KLOR-CON) 20 MEQ tablet; Take 1 tablet (20 mEq total) by mouth daily.   I have discontinued Mr. Giannini PROVENTIL HFA. I am also having him start on furosemide and potassium chloride SA. Additionally, I am having him maintain his albuterol, meloxicam, amLODipine, allopurinol, budesonide-formoterol, and losartan.  Meds ordered this encounter  Medications  . furosemide (LASIX) 40 MG tablet    Sig: Take 1 tablet (40 mg total) by mouth 2 (two) times daily. ONe at breakfast and lunch    Dispense:  60 tablet    Refill:  0  . potassium chloride SA (K-DUR,KLOR-CON) 20 MEQ tablet    Sig: Take 1 tablet (20 mEq total) by mouth daily.    Dispense:  30 tablet    Refill:  3    Patient's chest x-ray showed no signs of active congestive failure. Some hyperexpansion only. His symptoms are consistent with heart failure but his x-ray is not. The fluid that  he has may well be ascites although there is no shifting dullness. The d-dimer was ordered due to the dyspnea and history of COPD but the massive edema without any sign of erythema or stasis is not entirely consistent with a diagnosis of DVT plus PE.  Follow-up: Return in about 1 week (around 01/19/2016).  [name], M.D.

## 2016-01-13 ENCOUNTER — Other Ambulatory Visit: Payer: Self-pay | Admitting: Family Medicine

## 2016-01-13 ENCOUNTER — Telehealth: Payer: Self-pay | Admitting: Family Medicine

## 2016-01-13 ENCOUNTER — Ambulatory Visit (INDEPENDENT_AMBULATORY_CARE_PROVIDER_SITE_OTHER): Payer: BLUE CROSS/BLUE SHIELD | Admitting: Family Medicine

## 2016-01-13 ENCOUNTER — Other Ambulatory Visit: Payer: Self-pay | Admitting: *Deleted

## 2016-01-13 VITALS — BP 113/74 | HR 94 | Temp 97.0°F | Ht 71.0 in

## 2016-01-13 DIAGNOSIS — R740 Nonspecific elevation of levels of transaminase and lactic acid dehydrogenase [LDH]: Principal | ICD-10-CM

## 2016-01-13 DIAGNOSIS — R0789 Other chest pain: Secondary | ICD-10-CM | POA: Diagnosis not present

## 2016-01-13 DIAGNOSIS — R7401 Elevation of levels of liver transaminase levels: Secondary | ICD-10-CM

## 2016-01-13 DIAGNOSIS — R748 Abnormal levels of other serum enzymes: Secondary | ICD-10-CM

## 2016-01-13 LAB — CBC WITH DIFFERENTIAL/PLATELET
Basophils Absolute: 0 10*3/uL (ref 0.0–0.2)
Basos: 1 %
EOS (ABSOLUTE): 0.2 10*3/uL (ref 0.0–0.4)
EOS: 3 %
HEMATOCRIT: 38.9 % (ref 37.5–51.0)
Hemoglobin: 13 g/dL (ref 12.6–17.7)
IMMATURE GRANULOCYTES: 0 %
Immature Grans (Abs): 0 10*3/uL (ref 0.0–0.1)
Lymphocytes Absolute: 2.5 10*3/uL (ref 0.7–3.1)
Lymphs: 37 %
MCH: 31 pg (ref 26.6–33.0)
MCHC: 33.4 g/dL (ref 31.5–35.7)
MCV: 93 fL (ref 79–97)
MONOS ABS: 0.8 10*3/uL (ref 0.1–0.9)
Monocytes: 12 %
NEUTROS PCT: 47 %
Neutrophils Absolute: 3.1 10*3/uL (ref 1.4–7.0)
PLATELETS: 237 10*3/uL (ref 150–379)
RBC: 4.2 x10E6/uL (ref 4.14–5.80)
RDW: 14.1 % (ref 12.3–15.4)
WBC: 6.6 10*3/uL (ref 3.4–10.8)

## 2016-01-13 LAB — CMP14+EGFR
ALT: 167 IU/L — AB (ref 0–44)
AST: 80 IU/L — AB (ref 0–40)
Albumin/Globulin Ratio: 1.4 (ref 1.1–2.5)
Albumin: 4 g/dL (ref 3.6–4.8)
Alkaline Phosphatase: 54 IU/L (ref 39–117)
BUN/Creatinine Ratio: 15 (ref 10–22)
BUN: 14 mg/dL (ref 8–27)
Bilirubin Total: 0.3 mg/dL (ref 0.0–1.2)
CALCIUM: 9.1 mg/dL (ref 8.6–10.2)
CO2: 22 mmol/L (ref 18–29)
CREATININE: 0.95 mg/dL (ref 0.76–1.27)
Chloride: 101 mmol/L (ref 96–106)
GFR calc Af Amer: 99 mL/min/{1.73_m2} (ref >59)
GFR, EST NON AFRICAN AMERICAN: 85 mL/min/{1.73_m2} (ref >59)
GLUCOSE: 125 mg/dL — AB (ref 65–99)
Globulin, Total: 2.8 g/dL (ref 1.5–4.5)
Potassium: 5 mmol/L (ref 3.5–5.2)
Sodium: 140 mmol/L (ref 134–144)
Total Protein: 6.8 g/dL (ref 6.0–8.5)

## 2016-01-13 LAB — BRAIN NATRIURETIC PEPTIDE: BNP: 10.4 pg/mL (ref 0.0–100.0)

## 2016-01-13 LAB — D-DIMER, QUANTITATIVE: D-DIMER: 0.42 mg/L FEU (ref 0.00–0.49)

## 2016-01-13 NOTE — Progress Notes (Signed)
Subjective:  Patient ID: Juan Merritt, male    DOB: 09/02/1951  Age: 65 y.o. MRN: [id number]  CC: Chest Pain   HPI Juan Merritt presents for INcreasing pain in the left shoulder. He is currently under evaluation for abd pain and swellling and has had blood work for that. Today the shoulder pain moved over into the central and left chest. He was not active. There was no relation to food. He did not consume anything spicy or greasy to consider for heartburn. The sensation is a dull ache. Some occ. Sharpness. It is not episodic. Not associated with diaphoresis, dyspnea or nausea. The radiation is from the shoulder, not to the shoulder.   History Juan Merritt has a past medical history of Hypertension; Gout; and COPD (chronic obstructive pulmonary disease) ([place]).   He has past surgical history that includes Cholecystectomy and Tonsillectomy.   His family history includes COPD in his mother; Diabetes in his father.He reports that he has been smoking Cigarettes.  He has a 10 pack-year smoking history. He does not have any smokeless tobacco history on file. He reports that he drinks about 6.0 oz of alcohol per week. He reports that he does not use illicit drugs.    ROS Review of Systems  Constitutional: Negative for fever, chills, diaphoresis and unexpected weight change.  HENT: Negative for congestion, hearing loss, rhinorrhea and sore throat.   Eyes: Negative for visual disturbance.  Respiratory: Positive for chest tightness. Negative for cough and shortness of breath.   Cardiovascular: Positive for chest pain and leg swelling.  Gastrointestinal: Positive for abdominal distention. Negative for abdominal pain, diarrhea and constipation.  Genitourinary: Negative for dysuria and flank pain.  Musculoskeletal: Negative for joint swelling and arthralgias.  Skin: Negative for rash.  Neurological: Negative for dizziness and headaches.  Psychiatric/Behavioral: Negative for sleep disturbance and  dysphoric mood.    Objective:  BP 113/74 mmHg  Pulse 94  Temp(Src) 97 F (36.1 C) (Oral)  Ht 5\' 11"  (1.803 m)  SpO2 93%  BP Readings from Last 3 Encounters:  01/13/16 113/74  01/12/16 117/78  09/07/15 123/78    Wt Readings from Last 3 Encounters:  01/12/16 290 lb 9.6 oz (131.815 kg)  09/07/15 273 lb (123.832 kg)  08/03/15 274 lb (124.286 kg)     Physical Exam  Constitutional: He is oriented to person, place, and time. He appears well-developed and well-nourished. No distress.  HENT:  Head: Normocephalic and atraumatic.  Right Ear: External ear normal.  Left Ear: External ear normal.  Nose: Nose normal.  Mouth/Throat: Oropharynx is clear and moist.  Eyes: Conjunctivae and EOM are normal. Pupils are equal, round, and reactive to light.  Neck: Normal range of motion. Neck supple. No thyromegaly present.  Cardiovascular: Normal rate, regular rhythm and normal heart sounds.   No murmur heard. Pulmonary/Chest: Effort normal and breath sounds normal. No respiratory distress. He has no wheezes. He has no rales.  Abdominal: Soft. Bowel sounds are normal. He exhibits no distension. There is no tenderness.  Musculoskeletal: He exhibits tenderness (ball of left shoulder).  Lymphadenopathy:    He has no cervical adenopathy.  Neurological: He is alert and oriented to person, place, and time. He has normal reflexes.  Skin: Skin is warm and dry.  Psychiatric: He has a normal mood and affect. His behavior is normal. Judgment and thought content normal.     Lab Results  Component Value Date   WBC 6.6 01/12/2016   HGB 14.9 03/23/2015  HCT 38.9 01/12/2016   PLT 237 01/12/2016   GLUCOSE 125* 01/12/2016   CHOL 173 09/07/2015   TRIG 133 09/07/2015   HDL 43 09/07/2015   LDLCALC 103* 09/07/2015   ALT 167* 01/12/2016   AST 80* 01/12/2016   NA 140 01/12/2016   K 5.0 01/12/2016   CL 101 01/12/2016   CREATININE 0.95 01/12/2016   BUN 14 01/12/2016   CO2 22 01/12/2016   TSH 1.542  03/23/2015    Ct Chest Wo Contrast  05/01/2015  CLINICAL DATA:  Worsening dyspnea over the past month. Recent hospitalization for pneumonia. EXAM: CT CHEST WITHOUT CONTRAST TECHNIQUE: Multidetector CT imaging of the chest was performed following the standard protocol without IV contrast. COMPARISON:  03/23/2015 FINDINGS: Mediastinum/Nodes: Coronary, aortic arch, and branch vessel atherosclerotic calcification. Small right lower paratracheal lymph nodes are not pathologically enlarged. Subcutaneous lesion, likely a sebaceous cyst, just to the left of midline anterior to the sternum on image 26 series 2, 1.5 by 2.5 cm. Lungs/Pleura: Centrilobular emphysema. 2 mm subpleural lymph node along the upper margin of the left major fissure, thought to be clinically inconsequential. Lingular subsegmental atelectasis or scarring. Upper abdomen: Unremarkable Musculoskeletal: Mild thoracic spondylosis. Old healed left lateral rib fractures. IMPRESSION: 1. Centrilobular emphysema. 2. Coronary, aortic arch, and branch vessel atherosclerotic calcification. 3. Lingular subsegmental atelectasis or scarring. Electronically Signed   By: Van Clines M.D.   On: 05/01/2015 14:49    Assessment & Plan:   Mayar was seen today for chest pain.  Diagnoses and all orders for this visit:  Other chest pain -     EKG 12-Lead    EKG shows no acute ischemic change. Taken during pain.  I am having Juan Merritt maintain his albuterol, meloxicam, amLODipine, allopurinol, budesonide-formoterol, losartan, furosemide, and potassium chloride SA.  No orders of the defined types were placed in this encounter.     Follow-up: Return if symptoms worsen or fail to improve and after abd [place] for possible ascities.  [name], M.D.

## 2016-01-13 NOTE — Telephone Encounter (Signed)
Spoke with pt regarding new medication Pt stated after taking Furosemide he "felt funny" Pt denies cp, sob or nausea Pt stated he was feeling better now Informed pt if sxs return or worsen he will NTBS Pt verbalizes understanding

## 2016-01-14 ENCOUNTER — Telehealth: Payer: Self-pay | Admitting: Family

## 2016-01-14 ENCOUNTER — Other Ambulatory Visit: Payer: Self-pay

## 2016-01-14 DIAGNOSIS — R109 Unspecified abdominal pain: Secondary | ICD-10-CM

## 2016-01-14 NOTE — Telephone Encounter (Signed)
Next step in his care is to get the CT of the abd. done

## 2016-01-14 NOTE — Telephone Encounter (Signed)
Pt is aware.  

## 2016-01-15 ENCOUNTER — Other Ambulatory Visit: Payer: Self-pay | Admitting: Family Medicine

## 2016-01-15 ENCOUNTER — Ambulatory Visit (HOSPITAL_COMMUNITY)
Admission: RE | Admit: 2016-01-15 | Discharge: 2016-01-15 | Disposition: A | Discharge status: Home / Self Care | Payer: BLUE CROSS/BLUE SHIELD | Source: Ambulatory Visit | Attending: Family Medicine | Admitting: Family Medicine

## 2016-01-15 DIAGNOSIS — R109 Unspecified abdominal pain: Secondary | ICD-10-CM

## 2016-01-15 DIAGNOSIS — R932 Abnormal findings on diagnostic imaging of liver and biliary tract: Secondary | ICD-10-CM | POA: Diagnosis not present

## 2016-01-15 DIAGNOSIS — R1084 Generalized abdominal pain: Secondary | ICD-10-CM

## 2016-01-15 DIAGNOSIS — K769 Liver disease, unspecified: Secondary | ICD-10-CM

## 2016-01-18 ENCOUNTER — Ambulatory Visit (INDEPENDENT_AMBULATORY_CARE_PROVIDER_SITE_OTHER): Payer: BLUE CROSS/BLUE SHIELD

## 2016-01-18 ENCOUNTER — Ambulatory Visit (INDEPENDENT_AMBULATORY_CARE_PROVIDER_SITE_OTHER): Payer: BLUE CROSS/BLUE SHIELD | Admitting: Family Medicine

## 2016-01-18 ENCOUNTER — Encounter: Payer: Self-pay | Admitting: Family Medicine

## 2016-01-18 VITALS — BP 115/75 | HR 96 | Temp 97.2°F | Ht 71.0 in | Wt 277.4 lb

## 2016-01-18 DIAGNOSIS — I10 Essential (primary) hypertension: Secondary | ICD-10-CM

## 2016-01-18 DIAGNOSIS — J449 Chronic obstructive pulmonary disease, unspecified: Secondary | ICD-10-CM

## 2016-01-18 DIAGNOSIS — R0600 Dyspnea, unspecified: Secondary | ICD-10-CM | POA: Diagnosis not present

## 2016-01-18 DIAGNOSIS — R601 Generalized edema: Secondary | ICD-10-CM

## 2016-01-18 DIAGNOSIS — K769 Liver disease, unspecified: Secondary | ICD-10-CM

## 2016-01-18 NOTE — Addendum Note (Signed)
Addended by: Marin Olp on: 01/18/2016 08:51 AM   Modules accepted: Orders

## 2016-01-18 NOTE — Progress Notes (Signed)
Subjective:  Patient ID: Juan Merritt, male    DOB: 1951-12-02  Age: 65 y.o. MRN: [id number]  CC: Edema and Shortness of Breath   HPI [name] presents for Breathing much better. Wife says he goes out to his shed and drinks beer. Pt. Says he had two last night due to nerves. Worried about test result. Historically had 2-5 a day. Abd still swollen. Feels lighter. Tolerating med (lasix well.)   History Bryler has a past medical history of Hypertension; Gout; and COPD (chronic obstructive pulmonary disease) ([place]).   He has past surgical history that includes Cholecystectomy and Tonsillectomy.   His family history includes COPD in his mother; Diabetes in his father.He reports that he has been smoking Cigarettes.  He has a 10 pack-year smoking history. He does not have any smokeless tobacco history on file. He reports that he drinks about 6.0 oz of alcohol per week. He reports that he does not use illicit drugs.    ROS Review of Systems  Constitutional: Negative for fever, chills, diaphoresis and unexpected weight change.  HENT: Negative for congestion, hearing loss, rhinorrhea and sore throat.   Eyes: Negative for visual disturbance.  Respiratory: Negative for cough and shortness of breath.   Cardiovascular: Negative for chest pain.  Gastrointestinal: Negative for abdominal pain, diarrhea and constipation.  Genitourinary: Negative for dysuria and flank pain.  Musculoskeletal: Negative for joint swelling and arthralgias.  Skin: Negative for rash.  Neurological: Negative for dizziness and headaches.  Psychiatric/Behavioral: Negative for sleep disturbance and dysphoric mood.    Objective:  BP 115/75 mmHg  Pulse 96  Temp(Src) 97.2 F (36.2 C) (Oral)  Ht _0  (1.803 m)  Wt 277 lb 6.4 oz (125.828 kg)  BMI 38.71 kg/m2  SpO2 91%  BP Readings from Last 3 Encounters:  01/18/16 115/75  01/13/16 113/74  01/12/16 117/78    Wt Readings from Last 3 Encounters:    01/18/16 277 lb 6.4 oz (125.828 kg)  01/12/16 290 lb 9.6 oz (131.815 kg)  09/07/15 273 lb (123.832 kg)     Physical Exam  Constitutional: He is oriented to person, place, and time. He appears well-developed and well-nourished. No distress.  HENT:  Head: Normocephalic and atraumatic.  Right Ear: External ear normal.  Left Ear: External ear normal.  Nose: Nose normal.  Mouth/Throat: Oropharynx is clear and moist.  Eyes: Conjunctivae and EOM are normal. Pupils are equal, round, and reactive to light.  Neck: Normal range of motion. Neck supple. No thyromegaly present.  Cardiovascular: Normal rate, regular rhythm and normal heart sounds.   No murmur heard. Pulmonary/Chest: Effort normal and breath sounds normal. No respiratory distress. He has no wheezes. He has no rales.  Abdominal: Soft. Bowel sounds are normal. He exhibits no distension. There is no tenderness.  Lymphadenopathy:    He has no cervical adenopathy.  Neurological: He is alert and oriented to person, place, and time. He has normal reflexes.  Skin: Skin is warm and dry.  Psychiatric: He has a normal mood and affect. His behavior is normal. Judgment and thought content normal.     Lab Results  Component Value Date   WBC 6.6 01/12/2016   HGB 14.9 03/23/2015   HCT 38.9 01/12/2016   PLT 237 01/12/2016   GLUCOSE 125* 01/12/2016   CHOL 173 09/07/2015   TRIG 133 09/07/2015   HDL 43 09/07/2015   LDLCALC 103* 09/07/2015   ALT 167* 01/12/2016   AST 80* 01/12/2016   NA  140 01/12/2016   K 5.0 01/12/2016   CL 101 01/12/2016   CREATININE 0.95 01/12/2016   BUN 14 01/12/2016   CO2 22 01/12/2016   TSH 1.542 03/23/2015    US Abdomen Complete  01/15/2016  CLINICAL DATA:  Abdominal pain. EXAM: ABDOMEN ULTRASOUND COMPLETE COMPARISON:  No prior. FINDINGS: Gallbladder: Cholecystectomy. Common bile duct: Diameter: 4.4 mm Liver: Liver is echogenic consistent fatty infiltration and/or hepatocellular disease. No focal hepatic  abnormality identified. IVC: No abnormality visualized. Pancreas: Visualized portion unremarkable. Spleen: Size and appearance within normal limits. Right Kidney: Length: 12.5 cm. Echogenicity within normal limits. No mass or hydronephrosis visualized. Left Kidney: Length: 12.0 cm. Echogenicity within normal limits. No mass or hydronephrosis visualized. Abdominal aorta: No aneurysm visualized. Other findings: Difficult exam due to body habitus. IMPRESSION: 1. Liver is prominent. Liver is echogenic consistent fatty infiltration and/or hepatocellular disease. No focal hepatic abnormality. 2. Cholecystectomy.  No biliary distention. Electronically Signed   By: Marcello Moores  Register   On: 01/15/2016 09:16    Assessment & Plan:   Adante was seen today for edema and shortness of breath.  Diagnoses and all orders for this visit:  Generalized edema  COPD GOLD II still smoking  Essential hypertension, benign  Dyspnea  Hepatocellular damage -     CBC with Differential/Platelet -     CMP14+EGFR -     Hepatitis panel, acute    CT was ordered on 1/27. Pending insurance approval. Excellent response to fluid reduction measures.  I am having Mr. Falconi maintain his albuterol, meloxicam, amLODipine, allopurinol, budesonide-formoterol, losartan, furosemide, and potassium chloride SA.  No orders of the defined types were placed in this encounter.     Follow-up: Return in about 1 week (around 01/25/2016).  [name], M.D.

## 2016-01-19 LAB — CBC WITH DIFFERENTIAL/PLATELET
BASOS: 0 %
Basophils Absolute: 0 10*3/uL (ref 0.0–0.2)
EOS (ABSOLUTE): 0.1 10*3/uL (ref 0.0–0.4)
Eos: 2 %
HEMOGLOBIN: 14.6 g/dL (ref 12.6–17.7)
Hematocrit: 42.2 % (ref 37.5–51.0)
IMMATURE GRANS (ABS): 0 10*3/uL (ref 0.0–0.1)
Immature Granulocytes: 0 %
LYMPHS: 39 %
Lymphocytes Absolute: 2.7 10*3/uL (ref 0.7–3.1)
MCH: 31.6 pg (ref 26.6–33.0)
MCHC: 34.6 g/dL (ref 31.5–35.7)
MCV: 91 fL (ref 79–97)
MONOCYTES: 10 %
Monocytes Absolute: 0.7 10*3/uL (ref 0.1–0.9)
NEUTROS ABS: 3.3 10*3/uL (ref 1.4–7.0)
Neutrophils: 49 %
PLATELETS: 274 10*3/uL (ref 150–379)
RBC: 4.62 x10E6/uL (ref 4.14–5.80)
RDW: 13.3 % (ref 12.3–15.4)
WBC: 6.9 10*3/uL (ref 3.4–10.8)

## 2016-01-19 LAB — HEPATITIS PANEL, ACUTE
HEP A IGM: NEGATIVE
HEP B C IGM: NEGATIVE
HEP B S AG: NEGATIVE

## 2016-01-19 LAB — CMP14+EGFR
ALK PHOS: 57 IU/L (ref 39–117)
ALT: 287 IU/L — AB (ref 0–44)
AST: 134 IU/L — AB (ref 0–40)
Albumin/Globulin Ratio: 1.4 (ref 1.1–2.5)
Albumin: 4.2 g/dL (ref 3.6–4.8)
BUN/Creatinine Ratio: 15 (ref 10–22)
BUN: 15 mg/dL (ref 8–27)
Bilirubin Total: 0.7 mg/dL (ref 0.0–1.2)
CALCIUM: 9.2 mg/dL (ref 8.6–10.2)
CO2: 24 mmol/L (ref 18–29)
CREATININE: 0.97 mg/dL (ref 0.76–1.27)
Chloride: 93 mmol/L — ABNORMAL LOW (ref 96–106)
GFR calc Af Amer: 96 mL/min/{1.73_m2} (ref >59)
GFR, EST NON AFRICAN AMERICAN: 83 mL/min/{1.73_m2} (ref >59)
GLUCOSE: 111 mg/dL — AB (ref 65–99)
Globulin, Total: 3.1 g/dL (ref 1.5–4.5)
Potassium: 3.9 mmol/L (ref 3.5–5.2)
Sodium: 135 mmol/L (ref 134–144)
Total Protein: 7.3 g/dL (ref 6.0–8.5)

## 2016-01-22 ENCOUNTER — Ambulatory Visit (HOSPITAL_COMMUNITY)
Admission: RE | Admit: 2016-01-22 | Discharge: 2016-01-22 | Disposition: A | Discharge status: Home / Self Care | Payer: BLUE CROSS/BLUE SHIELD | Source: Ambulatory Visit | Attending: Family Medicine | Admitting: Family Medicine

## 2016-01-22 DIAGNOSIS — K760 Fatty (change of) liver, not elsewhere classified: Secondary | ICD-10-CM | POA: Diagnosis not present

## 2016-01-22 DIAGNOSIS — Z9049 Acquired absence of other specified parts of digestive tract: Secondary | ICD-10-CM | POA: Insufficient documentation

## 2016-01-22 DIAGNOSIS — I700 Atherosclerosis of aorta: Secondary | ICD-10-CM | POA: Insufficient documentation

## 2016-01-22 DIAGNOSIS — K769 Liver disease, unspecified: Secondary | ICD-10-CM | POA: Insufficient documentation

## 2016-01-22 MED ORDER — IOHEXOL 300 MG/ML  SOLN
100.0000 mL | Freq: Once | INTRAMUSCULAR | Status: AC | PRN
  Administered 2016-01-22: 100 mL via INTRAVENOUS

## 2016-01-25 ENCOUNTER — Other Ambulatory Visit: Payer: Self-pay | Admitting: Family Medicine

## 2016-01-25 ENCOUNTER — Telehealth: Payer: Self-pay | Admitting: Family Medicine

## 2016-01-25 DIAGNOSIS — K7031 Alcoholic cirrhosis of liver with ascites: Secondary | ICD-10-CM

## 2016-01-28 ENCOUNTER — Encounter: Payer: Self-pay | Admitting: Gastroenterology

## 2016-02-11 ENCOUNTER — Other Ambulatory Visit: Payer: Self-pay | Admitting: Family Medicine

## 2016-03-11 ENCOUNTER — Telehealth: Payer: Self-pay | Admitting: Family Medicine

## 2016-03-11 MED ORDER — OSELTAMIVIR PHOSPHATE 75 MG PO CAPS: 75.0000 mg | ORAL_CAPSULE | Freq: Two times a day (BID) | ORAL | Status: DC

## 2016-03-11 NOTE — Telephone Encounter (Signed)
I sent in the requested prescription 

## 2016-03-11 NOTE — Telephone Encounter (Signed)
Done

## 2016-03-11 NOTE — Telephone Encounter (Signed)
Pt aware.

## 2016-03-17 ENCOUNTER — Ambulatory Visit (INDEPENDENT_AMBULATORY_CARE_PROVIDER_SITE_OTHER): Payer: BLUE CROSS/BLUE SHIELD | Admitting: Gastroenterology

## 2016-03-17 ENCOUNTER — Encounter: Payer: Self-pay | Admitting: Gastroenterology

## 2016-03-17 VITALS — BP 110/70 | HR 80 | Ht 71.0 in | Wt 280.0 lb

## 2016-03-17 DIAGNOSIS — K700 Alcoholic fatty liver: Secondary | ICD-10-CM | POA: Diagnosis not present

## 2016-03-17 DIAGNOSIS — J449 Chronic obstructive pulmonary disease, unspecified: Secondary | ICD-10-CM

## 2016-03-17 DIAGNOSIS — R609 Edema, unspecified: Secondary | ICD-10-CM

## 2016-03-17 DIAGNOSIS — R945 Abnormal results of liver function studies: Secondary | ICD-10-CM

## 2016-03-17 DIAGNOSIS — R7989 Other specified abnormal findings of blood chemistry: Secondary | ICD-10-CM

## 2016-03-17 NOTE — Progress Notes (Signed)
[facility] Consult Note:  History: Juan Merritt 03/17/2016  Referring physician: [name], MD  Reason for consult/chief complaint: elevated LFTs  Subjective HPI:  This is the initial office consult for a 65 year old man referred by his PCP for concern of liver problems. The patient was seen for increasing edema over the last several months, started on low-dose Lasix and potassium, and his LFTs were noted to be elevated. This prompted an ultrasound, which showed a fatty liver without mass or evidence of cirrhosis. No ascites was seen on full abdominal ultrasound. A CT scan followed, which showed an enlarged and fatty liver with no evidence of portal hypertension in the way of varices or ascites. The patient is been gaining abdominal weight and he and his wife were concerned that it may be from fluid retention. He reports that he recently cut down alcohol use, but that it is still about to 12 packs per week. He is also cut down to just a few cigarettes a day and is mostly using E cigarettes. He previously smoked a pack and half of cigarettes a day He has no personal or family history of liver disease.  ROS:  Review of Systems  Constitutional: Negative for appetite change and unexpected weight change.  HENT: Negative for mouth sores and voice change.   Eyes: Negative for pain and redness.  Respiratory: Positive for cough. Negative for shortness of breath.   Cardiovascular: Positive for leg swelling. Negative for chest pain and palpitations.  Genitourinary: Negative for dysuria and hematuria.  Musculoskeletal: Negative for myalgias and arthralgias.  Skin: Negative for pallor and rash.  Neurological: Negative for weakness and headaches.  Hematological: Negative for adenopathy.     Past Medical History: Past Medical History  Diagnosis Date  . Hypertension   . Gout   . COPD (chronic obstructive pulmonary disease) ([facility])     not on home o2  . Arthritis   .  Gallstones   . Obesity   . Pneumonia    He was hospitalized about a year ago for pneumonia and COPD exacerbation  Past Surgical History: Past Surgical History  Procedure Laterality Date  . Cholecystectomy    . Tonsillectomy       Family History: Family History  Problem Relation Age of Onset  . Asthma Mother   . Diabetes Father   . Esophageal cancer Paternal Uncle   . Heart disease Mother   . Congestive Heart Failure Mother   . Lung disease      pat cousin  . Colon cancer Neg Hx   . Leukemia Brother   . Heart attack      pat cousin    Social History: Social History   Social History  . Marital Status: Married    Spouse Name: [name]  . Number of Children: 2  . Years of Education: N/A   Occupational History  . retired Administrator    Social History Main Topics  . Smoking status: Light Tobacco Smoker -- 0.25 packs/day for 40 years    Types: Cigarettes  . Smokeless tobacco: Current User    Types: Chew     Comment: tobacco info given 03/17/16  . Alcohol Use: 6.0 oz/week    10 Standard drinks or equivalent per week  . Drug Use: No  . Sexual Activity: Not Asked   Other Topics Concern  . None   Social History Narrative    Allergies: No Known Allergies  Outpatient Meds: Current Outpatient Prescriptions  Medication Sig Dispense Refill  .  albuterol (PROVENTIL) (2.5 MG/3ML) 0.083% nebulizer solution Take 3 mLs (2.5 mg total) by nebulization every 6 (six) hours as needed for wheezing or shortness of breath. 150 mL 1  . allopurinol (ZYLOPRIM) 100 MG tablet TAKE 1 TABLET (100 MG TOTAL) BY MOUTH DAILY. To prevent gout 90 tablet 3  . amLODipine (NORVASC) 10 MG tablet Take 1 tablet (10 mg total) by mouth daily. 90 tablet 1  . budesonide-formoterol (SYMBICORT) 160-4.5 MCG/ACT inhaler Take 2 puffs first thing in am and then another 2 puffs about 12 hours later. 3 Inhaler 3  . furosemide (LASIX) 40 MG tablet TAKE 1 TABLET (40 MG TOTAL) BY MOUTH 2 (TWO) TIMES DAILY. ONE AT  BREAKFAST AND LUNCH 60 tablet 4  . losartan (COZAAR) 100 MG tablet TAKE 1 TABLET (100 MG TOTAL) BY MOUTH DAILY. 90 tablet 1  . meloxicam (MOBIC) 15 MG tablet Take 1 tablet (15 mg total) by mouth daily. 90 tablet 3  . potassium chloride SA (K-DUR,KLOR-CON) 20 MEQ tablet Take 1 tablet (20 mEq total) by mouth daily. 30 tablet 3   No current facility-administered medications for this visit.      ___________________________________________________________________ Objective  Exam:  BP 110/70 mmHg  Pulse 80  Ht 5\' 11"  (1.803 m)  Wt 280 lb (127.007 kg)  BMI 39.07 kg/m2   General: this is a(n) Morbidly obese white male with good muscle mass   Eyes: sclera anicteric, no redness  ENT: oral mucosa moist without lesions, no cervical or supraclavicular lymphadenopathy, good dentition  CV: RRR without murmur, S1/S2, no JVD, no peripheral edema  Resp: clear to auscultation bilaterally, normal RR and effort noted  GI: soft, no tenderness, with active bowel sounds. No guarding or palpable organomegaly noted, , however limited by abdominal girth  Skin; warm and dry, no rash or jaundice noted, no spider nevi  Neuro: awake, alert and oriented x 3. Normal gross motor function and fluent speech. No asterixis.  Speech fluent, steady gait  Labs:  Lab Results  Component Value Date   WBC 6.9 01/18/2016   HGB 14.9 03/23/2015   HCT 42.2 01/18/2016   MCV 91 01/18/2016   PLT 274 01/18/2016   Lab Results  Component Value Date   ALT 287* 01/18/2016   AST 134* 01/18/2016   ALKPHOS 57 01/18/2016   BILITOT 0.7 01/18/2016    Recent neg viral hep panel Radiologic Studies: Recent CTAP;  Fatty liver, o/w nml 2d echo [id number] - mild diastolic dysfunction, o/w limited study.  Unclear what PA pressures were Assessment: Encounter Diagnoses  Name Primary?  . Fatty liver, alcoholic Yes  . Edema, unspecified type   . COPD GOLD II still smoking   . Morbid obesity due to excess calories ([place])   . LFT  elevation     This appears due to excess alcohol use, with a probable lesser component of obesity and poor diet choices. While he may have pre-cirrhotic portal hypertension from NASH, the edema is also most likely from morbid obesity, pulmonary hypertension from COPD, and diastolic dysfunction. He is also never been screened for colon cancer, so I described a colonoscopy in the indication for at length. He would like to give it further thought, so he declines at this time. Plan: I have referred him back to primary care for management of his diuretics. Consider the addition of spironolactone to furosemide for improved diuresis and maintenance of normokalemia He was given written materials on sodium restriction  The patient will see [place] as needed.  Thank you for the courtesy of this consult.  Please call me with any questions or concerns.  Nelida Meuse III

## 2016-03-17 NOTE — Patient Instructions (Addendum)
It has been recommended to you by your physician that you have a(n) colonoscopy completed. Per your request, we did not schedule the procedure(s) today. Please contact our office at [phone] should you decide to have the procedure completed.  Low sodium diet recommendations given.  Return to your primary care doctor for management of diuretics for leg swelling.  Thank you for choosing [facility]  Dr Wilfrid Lund III

## 2016-03-22 ENCOUNTER — Telehealth: Payer: Self-pay | Admitting: *Deleted

## 2016-03-22 ENCOUNTER — Telehealth: Payer: Self-pay | Admitting: Family Medicine

## 2016-03-22 ENCOUNTER — Other Ambulatory Visit: Payer: Self-pay | Admitting: Family Medicine

## 2016-03-22 MED ORDER — SPIRONOLACTONE 25 MG PO TABS: 25.0000 mg | ORAL_TABLET | Freq: Every day | ORAL | Status: DC

## 2016-03-22 NOTE — Telephone Encounter (Signed)
Patient aware.

## 2016-03-22 NOTE — Telephone Encounter (Signed)
[facility] pharmacist wants to know if pt is supposed to stay on Lasix with new RX of Spironalactone Please review and advise

## 2016-03-22 NOTE — Telephone Encounter (Signed)
The doctor at [facility] recommended adding spironolactone to help with his fluid overload. I went ahead and sent that prescription in for Juan Merritt. He also recommended a colonoscopy. I do recommend Juan Merritt go forward with that as well.

## 2016-03-22 NOTE — Telephone Encounter (Signed)
Yes, I will be following his potassium level.

## 2016-03-22 NOTE — Telephone Encounter (Signed)
Received phone call from [facility] pharmacist. Is patient suppose to stay on potassium with New rx of spironalactone.

## 2016-03-22 NOTE — Telephone Encounter (Signed)
Yes

## 2016-03-23 NOTE — Telephone Encounter (Signed)
Message left that Dr. Livia Snellen will continue to monitor this patient's potassium levels while taking spironalactone.

## 2016-04-06 ENCOUNTER — Other Ambulatory Visit: Payer: Self-pay | Admitting: Internal Medicine

## 2016-04-07 ENCOUNTER — Other Ambulatory Visit: Payer: Self-pay | Admitting: Family Medicine

## 2016-04-20 ENCOUNTER — Other Ambulatory Visit: Payer: Self-pay | Admitting: *Deleted

## 2016-04-20 MED ORDER — POTASSIUM CHLORIDE CRYS ER 20 MEQ PO TBCR: 20.0000 meq | EXTENDED_RELEASE_TABLET | Freq: Every day | ORAL | Status: DC

## 2016-04-20 MED ORDER — SPIRONOLACTONE 25 MG PO TABS: 25.0000 mg | ORAL_TABLET | Freq: Every day | ORAL | Status: DC

## 2016-04-29 ENCOUNTER — Other Ambulatory Visit: Payer: Self-pay

## 2016-04-29 MED ORDER — FUROSEMIDE 40 MG PO TABS: ORAL_TABLET | ORAL | Status: DC

## 2016-06-17 ENCOUNTER — Other Ambulatory Visit: Payer: Self-pay | Admitting: Family

## 2016-07-01 ENCOUNTER — Ambulatory Visit (INDEPENDENT_AMBULATORY_CARE_PROVIDER_SITE_OTHER): Payer: BLUE CROSS/BLUE SHIELD | Admitting: Family Medicine

## 2016-07-01 ENCOUNTER — Encounter: Payer: Self-pay | Admitting: Family Medicine

## 2016-07-01 VITALS — BP 115/73 | HR 89 | Temp 97.6°F | Ht 71.0 in | Wt 278.8 lb

## 2016-07-01 DIAGNOSIS — M2550 Pain in unspecified joint: Secondary | ICD-10-CM

## 2016-07-01 DIAGNOSIS — I10 Essential (primary) hypertension: Secondary | ICD-10-CM

## 2016-07-01 DIAGNOSIS — M1A9XX0 Chronic gout, unspecified, without tophus (tophi): Secondary | ICD-10-CM | POA: Diagnosis not present

## 2016-07-01 DIAGNOSIS — J449 Chronic obstructive pulmonary disease, unspecified: Secondary | ICD-10-CM | POA: Diagnosis not present

## 2016-07-01 MED ORDER — CELECOXIB 400 MG PO CAPS: 400.0000 mg | ORAL_CAPSULE | Freq: Every day | ORAL | Status: DC

## 2016-07-01 NOTE — Progress Notes (Signed)
Subjective:  Patient ID: Juan Merritt, male    DOB: 09/20/1951  Age: 65 y.o. MRN: [id number]  CC: Hypertension and Gout   HPI WEBB WEED presents for  follow-up of hypertension. Patient has no history of headache chest pain or shortness of breath or recent cough. Patient also denies symptoms of TIA such as numbness weakness lateralizing. Patient checks  blood pressure at home and has not had any elevated readings recently. Patient denies side effects from medication. States taking it regularly.  Denies gout, but is having joint pains at the shoulders and heels. No edema or erythema associated. Added indomethacin with good result.  Breathing is good. Smoking two cigarettes a day only - one before bed & one when he gets up. Using symbicort BID History Ram has a past medical history of Hypertension; Gout; COPD (chronic obstructive pulmonary disease) ([place]); Arthritis; Gallstones; Obesity; and Pneumonia.   He has past surgical history that includes Cholecystectomy and Tonsillectomy.   His family history includes Asthma in his mother; Congestive Heart Failure in his mother; Diabetes in his father; Esophageal cancer in his paternal uncle; Heart disease in his mother; Leukemia in his brother. There is no history of Colon cancer.He reports that he has been smoking Cigarettes.  He has a 10 pack-year smoking history. His smokeless tobacco use includes Chew. He reports that he drinks about 6.0 oz of alcohol per week. He reports that he does not use illicit drugs.  Current Outpatient Prescriptions on File Prior to Visit  Medication Sig Dispense Refill  . albuterol (PROVENTIL) (2.5 MG/3ML) 0.083% nebulizer solution Take 3 mLs (2.5 mg total) by nebulization every 6 (six) hours as needed for wheezing or shortness of breath. 150 mL 1  . allopurinol (ZYLOPRIM) 100 MG tablet TAKE 1 TABLET (100 MG TOTAL) BY MOUTH DAILY. To prevent gout 90 tablet 3  . amLODipine (NORVASC) 10 MG tablet TAKE 1 TABLET  (10 MG TOTAL) BY MOUTH DAILY. 90 tablet 0  . furosemide (LASIX) 40 MG tablet TAKE 1 TABLET (40 MG TOTAL) BY MOUTH 2 (TWO) TIMES DAILY. ONE AT BREAKFAST AND LUNCH 180 tablet 0  . losartan (COZAAR) 100 MG tablet TAKE 1 TABLET (100 MG TOTAL) BY MOUTH DAILY. 90 tablet 1  . potassium chloride SA (K-DUR,KLOR-CON) 20 MEQ tablet Take 1 tablet (20 mEq total) by mouth daily. 90 tablet 0  . spironolactone (ALDACTONE) 25 MG tablet Take 1 tablet (25 mg total) by mouth daily. 90 tablet 0  . SYMBICORT 160-4.5 MCG/ACT inhaler USE 2 PUFFS IN THE MORNING AND 2 PUFFS ABOUT 12 HOURS LATER 10.2 Inhaler 0  . indomethacin (INDOCIN) 50 MG capsule TAKE 1 CAPSULE BY MOUTH THREE TIMES A DAY AS NEEDED (Patient not taking: Reported on 07/01/2016) 90 capsule 0   No current facility-administered medications on file prior to visit.    ROS Review of Systems  Constitutional: Negative for fever, chills, diaphoresis and unexpected weight change.  HENT: Negative for congestion, hearing loss, rhinorrhea and sore throat.   Eyes: Negative for visual disturbance.  Respiratory: Positive for cough (much better since cutting back on cigarettes). Negative for shortness of breath.   Cardiovascular: Negative for chest pain.  Gastrointestinal: Negative for abdominal pain, diarrhea and constipation.  Genitourinary: Negative for dysuria and flank pain.  Musculoskeletal: Positive for myalgias and arthralgias. Negative for joint swelling.  Skin: Negative for rash.  Neurological: Negative for dizziness and headaches.  Psychiatric/Behavioral: Negative for sleep disturbance and dysphoric mood.    Objective:  BP  115/73 mmHg  Pulse 89  Temp(Src) 97.6 F (36.4 C) (Oral)  Ht _0  (1.803 m)  Wt 278 lb 12.8 oz (126.463 kg)  BMI 38.90 kg/m2  SpO2 96%  BP Readings from Last 3 Encounters:  07/01/16 115/73  03/17/16 110/70  01/18/16 115/75    Wt Readings from Last 3 Encounters:  07/01/16 278 lb 12.8 oz (126.463 kg)  03/17/16 280 lb  (127.007 kg)  01/18/16 277 lb 6.4 oz (125.828 kg)     Physical Exam  Constitutional: He is oriented to person, place, and time. He appears well-developed and well-nourished. No distress.  HENT:  Head: Normocephalic and atraumatic.  Right Ear: External ear normal.  Left Ear: External ear normal.  Nose: Nose normal.  Mouth/Throat: Oropharynx is clear and moist.  Eyes: Conjunctivae and EOM are normal. Pupils are equal, round, and reactive to light.  Neck: Normal range of motion. Neck supple. No thyromegaly present.  Cardiovascular: Normal rate, regular rhythm and normal heart sounds.   No murmur heard. Pulmonary/Chest: Effort normal and breath sounds normal. No respiratory distress. He has no wheezes. He has no rales.  Abdominal: Soft. He exhibits no distension.  Lymphadenopathy:    He has no cervical adenopathy.  Neurological: He is alert and oriented to person, place, and time. He has normal reflexes.  Skin: Skin is warm and dry.  Psychiatric: He has a normal mood and affect. His behavior is normal. Judgment and thought content normal.     Lab Results  Component Value Date   WBC 6.9 01/18/2016   HGB 14.9 03/23/2015   HCT 42.2 01/18/2016   PLT 274 01/18/2016   GLUCOSE 111* 01/18/2016   CHOL 173 09/07/2015   TRIG 133 09/07/2015   HDL 43 09/07/2015   LDLCALC 103* 09/07/2015   ALT 287* 01/18/2016   AST 134* 01/18/2016   NA 135 01/18/2016   K 3.9 01/18/2016   CL 93* 01/18/2016   CREATININE 0.97 01/18/2016   BUN 15 01/18/2016   CO2 24 01/18/2016   TSH 1.542 03/23/2015    Ct Abdomen Pelvis W Wo Contrast  01/23/2016  CLINICAL DATA:  Focus Liver. Heptocellular dx vs fatty liver on ultrasound. Elevated liver functions and enzymes. Hx of HTN,COPD,cholecystectomy EXAM: CT ABDOMEN AND PELVIS WITHOUT AND WITH CONTRAST TECHNIQUE: Multidetector CT imaging of the abdomen and pelvis was performed following the standard protocol before and following the bolus administration of  intravenous contrast. CONTRAST:  15m OMNIPAQUE IOHEXOL 300 MG/ML  SOLN COMPARISON:  Ultrasound 01/15/2016,  CT 05/01/2015 FINDINGS: Lower chest: Lung bases are clear. Hepatobiliary: Low-attenuation liver suggests hepatic steatosis. Arterial phase imaging demonstrates no focal enhancing lesion. Patient status post cholecystectomy. Portal venous phase imaging demonstrates a subcapsular enhancement in the anterior LEFT hepatic lobe (image 42, series 7) in a vascular pattern suggesting perfusion anomaly (image 42, series 7). Portal veins are patent. Pancreas: Pancreas is normal. No ductal dilatation. No pancreatic inflammation. Spleen: Normal spleen Adrenals/urinary tract: Adrenal glands and kidneys are normal. The ureters and bladder normal. Stomach/Bowel: Stomach, small bowel, appendix, and cecum are normal. The colon and rectosigmoid colon are normal. Vascular/Lymphatic: Abdominal aorta is normal caliber with atherosclerotic calcification. There is no retroperitoneal or periportal lymphadenopathy. No pelvic lymphadenopathy. Reproductive: Prostate normal Other: No free fluid. Musculoskeletal: No aggressive osseous lesion. IMPRESSION: 1. Minimal hepatic steatosis. No worsened hepatic lesion. Normal biliary tree. Post cholecystectomy. 2.  Atherosclerotic calcification of the abdominal aorta. Electronically Signed   By: SSuzy BouchardM.D.   On: 01/23/2016 09:18  Assessment & Plan:   Breslin was seen today for hypertension and gout.  Diagnoses and all orders for this visit:  COPD GOLD II still smoking -     PR BREATHING CAPACITY TEST  Essential hypertension, benign -     BMP8+EGFR  Chronic gout without tophus, unspecified cause, unspecified site -     Uric acid  Arthralgia  Other orders -     celecoxib (CELEBREX) 400 MG capsule; Take 1 capsule (400 mg total) by mouth daily. With food   I have discontinued Mr. Childers's meloxicam. I am also having him start on celecoxib. Additionally, I am  having him maintain his albuterol, allopurinol, losartan, SYMBICORT, amLODipine, potassium chloride SA, spironolactone, furosemide, and indomethacin.  Meds ordered this encounter  Medications  . celecoxib (CELEBREX) 400 MG capsule    Sig: Take 1 capsule (400 mg total) by mouth daily. With food    Dispense:  30 capsule    Refill:  5    Continue effort to completely stop s,moking.  Do not combine celebrex and indomethacin within a day of each other. Save indomethacin for only gout atttacks.  Follow-up: Return in about 6 months (around 01/01/2017) for CPE.  [name], M.D.

## 2016-07-02 LAB — BMP8+EGFR
BUN / CREAT RATIO: 16 (ref 10–24)
BUN: 17 mg/dL (ref 8–27)
CO2: 23 mmol/L (ref 18–29)
Calcium: 9.3 mg/dL (ref 8.6–10.2)
Chloride: 97 mmol/L (ref 96–106)
Creatinine, Ser: 1.05 mg/dL (ref 0.76–1.27)
GFR calc Af Amer: 88 mL/min/{1.73_m2} (ref >59)
GFR calc non Af Amer: 76 mL/min/{1.73_m2} (ref >59)
Glucose: 104 mg/dL — ABNORMAL HIGH (ref 65–99)
POTASSIUM: 4.5 mmol/L (ref 3.5–5.2)
SODIUM: 137 mmol/L (ref 134–144)

## 2016-07-02 LAB — URIC ACID: Uric Acid: 7.7 mg/dL (ref 3.7–8.6)

## 2016-07-04 NOTE — Progress Notes (Signed)
Patient aware.

## 2016-07-06 ENCOUNTER — Other Ambulatory Visit: Payer: Self-pay | Admitting: Family Medicine

## 2016-07-21 ENCOUNTER — Other Ambulatory Visit: Payer: Self-pay | Admitting: Family Medicine

## 2016-07-22 ENCOUNTER — Other Ambulatory Visit: Payer: Self-pay | Admitting: Family Medicine

## 2016-09-12 ENCOUNTER — Other Ambulatory Visit: Payer: Self-pay | Admitting: Family Medicine

## 2016-09-26 IMAGING — CR DG CHEST 2V
2 series · 2 of 2 positions shown · non-contrast
Comparison: 10/07/2014

CLINICAL DATA: Hypoxia and shortness of Breath

EXAM:
CHEST  2 VIEW

[view not recorded (1 of 2)]
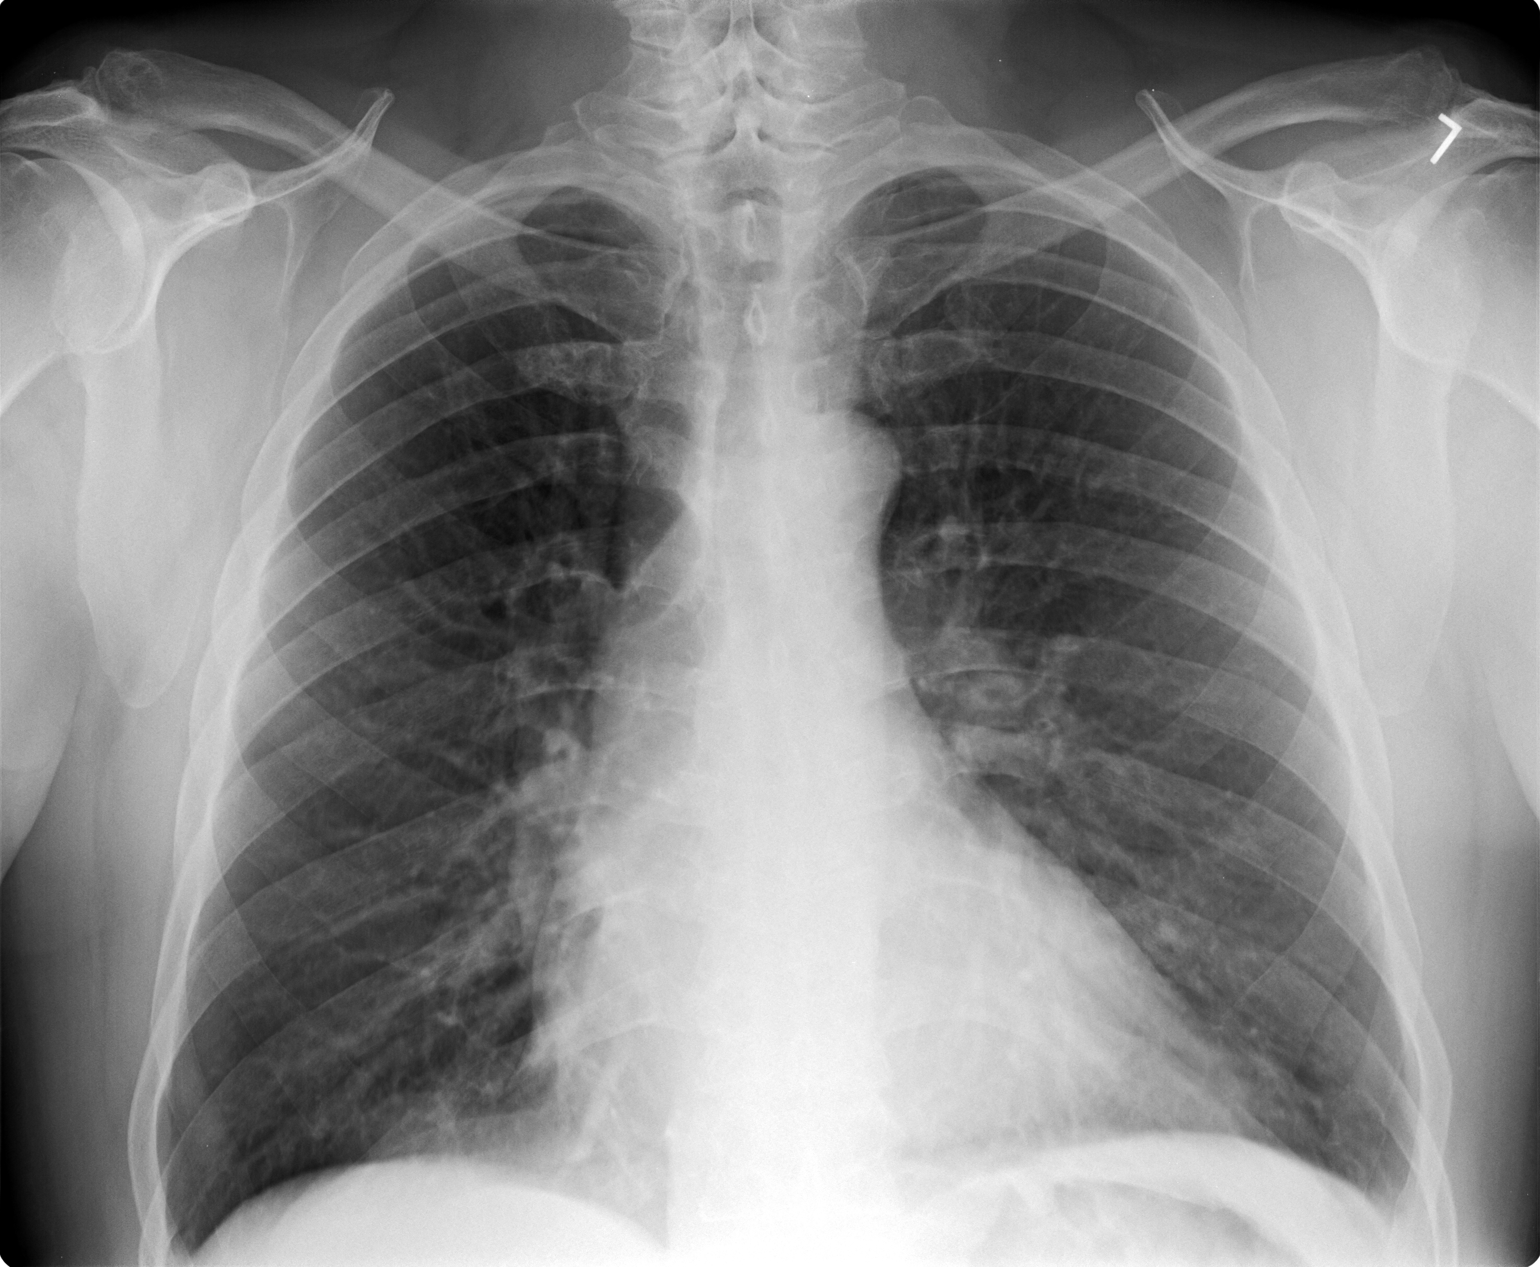

[view not recorded (2 of 2)]
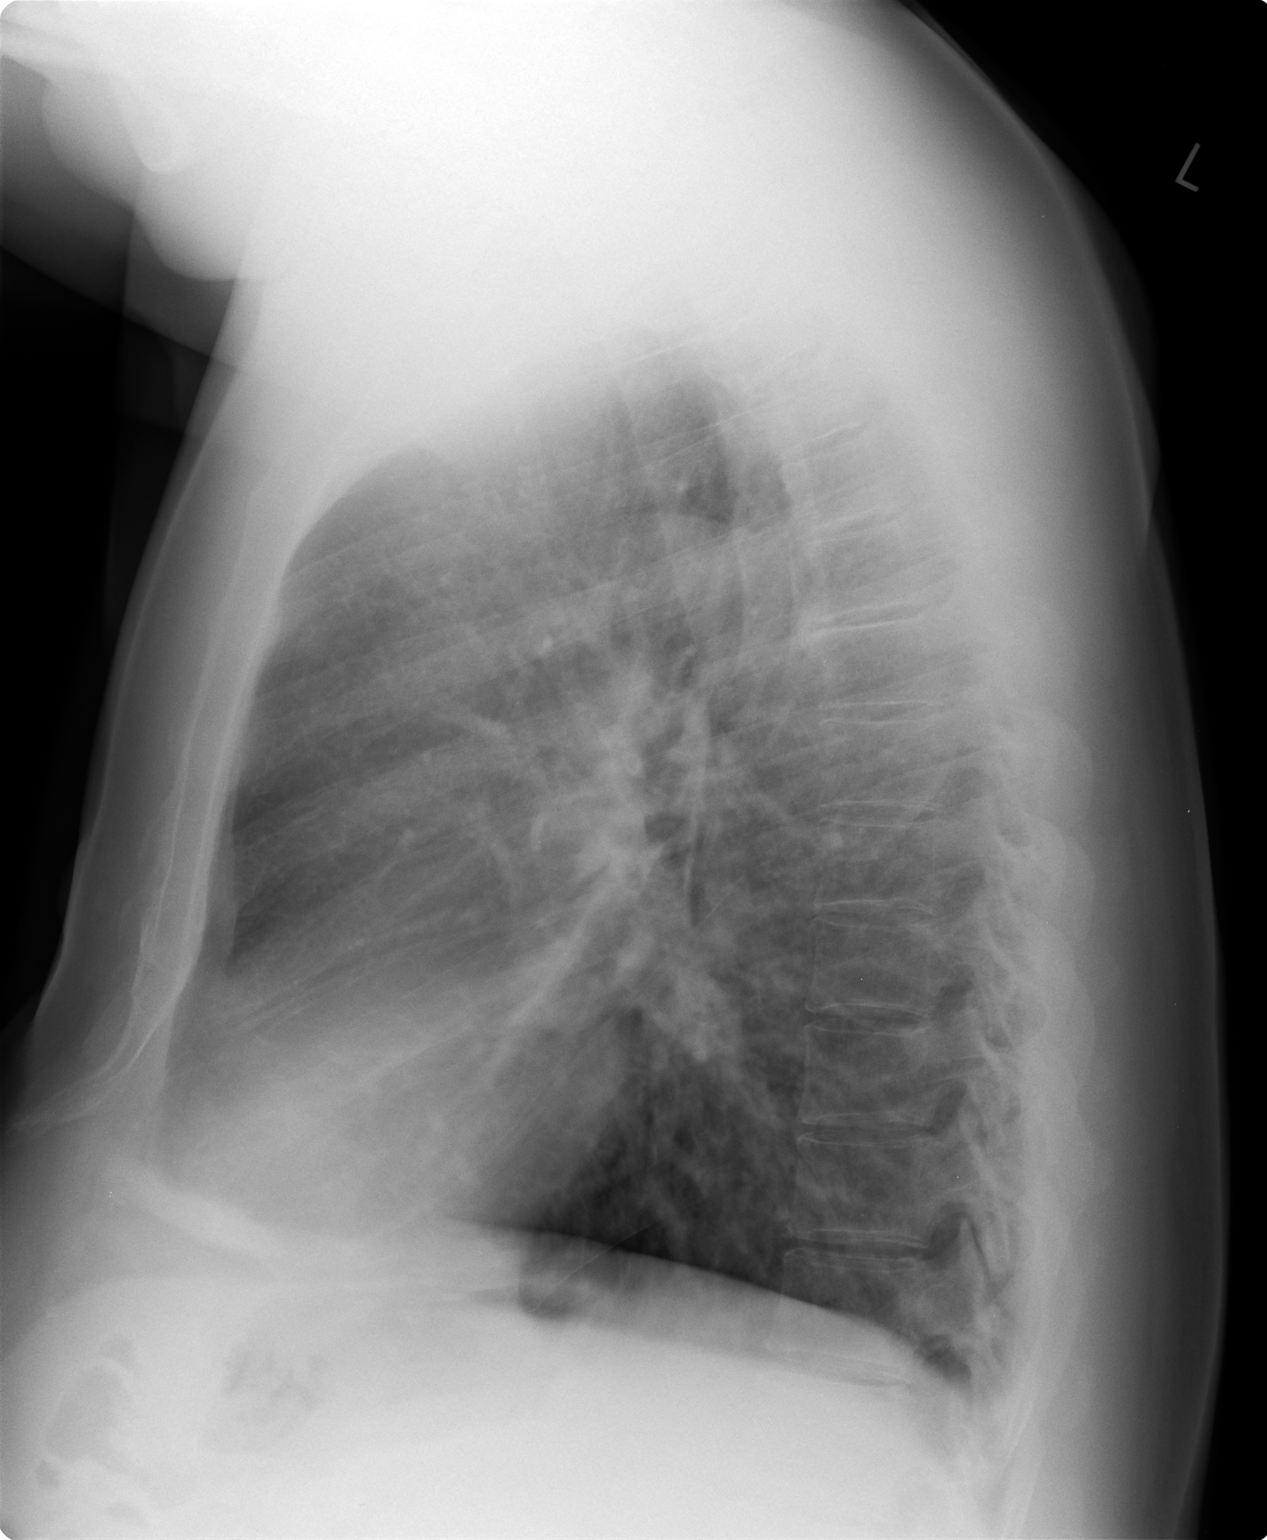

[2 of 2 positions shown; findings below may reference images not displayed]

FINDINGS: Cardiac shadow is stable. The lungs are again hyperinflated
consistent with COPD. No focal infiltrate or sizable effusion is
noted. No acute bony abnormality is seen.
IMPRESSION: COPD without acute abnormality.

## 2016-10-10 ENCOUNTER — Telehealth: Payer: Self-pay | Admitting: Internal Medicine

## 2016-10-10 NOTE — Telephone Encounter (Signed)
Spoke with pt and advised that refills for Symbicort need to be sent to Dr Livia Snellen his primary md. If he will not refill this then we can schedule pt f/u with Dr Melvyn Novas for refills.  Pt verbalized understanding and will call Dr Quinn Axe for refill.

## 2016-10-12 ENCOUNTER — Ambulatory Visit: Payer: BLUE CROSS/BLUE SHIELD | Admitting: Family Medicine

## 2016-10-12 ENCOUNTER — Telehealth: Payer: Self-pay | Admitting: Family Medicine

## 2016-10-12 ENCOUNTER — Ambulatory Visit: Payer: BLUE CROSS/BLUE SHIELD | Admitting: Pediatrics

## 2016-10-14 ENCOUNTER — Encounter: Payer: Self-pay | Admitting: Family Medicine

## 2016-10-14 ENCOUNTER — Ambulatory Visit (INDEPENDENT_AMBULATORY_CARE_PROVIDER_SITE_OTHER): Payer: BLUE CROSS/BLUE SHIELD | Admitting: Family Medicine

## 2016-10-14 VITALS — BP 104/67 | HR 83 | Temp 97.9°F | Ht 71.0 in | Wt 282.4 lb

## 2016-10-14 DIAGNOSIS — I10 Essential (primary) hypertension: Secondary | ICD-10-CM

## 2016-10-14 DIAGNOSIS — J449 Chronic obstructive pulmonary disease, unspecified: Secondary | ICD-10-CM | POA: Diagnosis not present

## 2016-10-14 DIAGNOSIS — Z23 Encounter for immunization: Secondary | ICD-10-CM

## 2016-10-14 NOTE — Progress Notes (Signed)
Subjective:  Patient ID: Juan Merritt, male    DOB: Dec 02, 1951  Age: 65 y.o. MRN: [id number]  CC: Medication Refill (pt here today for a refill on Symbicort)   HPI Juan Merritt presents for Follow-up of COPD. He has been somewhat short of breath since running out Symbicort couple of days ago. Symptoms are mild. They do not affect her activities. There has been no cough.   History Juan Merritt has a past medical history of Arthritis; COPD (chronic obstructive pulmonary disease) ([place]); Gallstones; Gout; Hypertension; Obesity; and Pneumonia.   He has a past surgical history that includes Cholecystectomy and Tonsillectomy.   His family history includes Asthma in his mother; Congestive Heart Failure in his mother; Diabetes in his father; Esophageal cancer in his paternal uncle; Heart disease in his mother; Leukemia in his brother.He reports that he has been smoking Cigarettes.  He has a 10.00 pack-year smoking history. His smokeless tobacco use includes Chew. He reports that he drinks about 6.0 oz of alcohol per week . He reports that he does not use drugs.    ROS Review of Systems  Constitutional: Negative for chills, diaphoresis, fever and unexpected weight change.  HENT: Negative for congestion, hearing loss, rhinorrhea and sore throat.   Eyes: Negative for visual disturbance.  Respiratory: Negative for cough and shortness of breath.   Cardiovascular: Negative for chest pain.  Gastrointestinal: Negative for abdominal pain, constipation and diarrhea.  Genitourinary: Negative for dysuria and flank pain.  Musculoskeletal: Negative for arthralgias and joint swelling.  Skin: Negative for rash.  Neurological: Negative for dizziness and headaches.  Psychiatric/Behavioral: Negative for dysphoric mood and sleep disturbance.    Objective:  BP 104/67   Pulse 83   Temp 97.9 F (36.6 C) (Oral)   Ht 5\' 11"  (1.803 m)   Wt 282 lb 6 oz (128.1 kg)   BMI 39.38 kg/m   BP Readings from Last  3 Encounters:  10/14/16 104/67  07/01/16 115/73  03/17/16 110/70    Wt Readings from Last 3 Encounters:  10/14/16 282 lb 6 oz (128.1 kg)  07/01/16 278 lb 12.8 oz (126.5 kg)  03/17/16 280 lb (127 kg)     Physical Exam  Constitutional: He is oriented to person, place, and time. He appears well-developed and well-nourished. No distress.  HENT:  Head: Normocephalic and atraumatic.  Right Ear: External ear normal.  Left Ear: External ear normal.  Nose: Nose normal.  Mouth/Throat: Oropharynx is clear and moist.  Eyes: Conjunctivae and EOM are normal. Pupils are equal, round, and reactive to light.  Neck: Normal range of motion. Neck supple. No thyromegaly present.  Cardiovascular: Normal rate, regular rhythm and normal heart sounds.   No murmur heard. Pulmonary/Chest: Effort normal and breath sounds normal. No respiratory distress. He has no wheezes. He has no rales.  Abdominal: Soft. Bowel sounds are normal. He exhibits no distension. There is no tenderness.  Lymphadenopathy:    He has no cervical adenopathy.  Neurological: He is alert and oriented to person, place, and time. He has normal reflexes.  Skin: Skin is warm and dry.  Psychiatric: He has a normal mood and affect. His behavior is normal. Judgment and thought content normal.     Lab Results  Component Value Date   WBC 6.9 01/18/2016   HGB 14.9 03/23/2015   HCT 42.2 01/18/2016   PLT 274 01/18/2016   GLUCOSE 104 (H) 07/01/2016   CHOL 173 09/07/2015   TRIG 133 09/07/2015   HDL 43  09/07/2015   LDLCALC 103 (H) 09/07/2015   ALT 287 (H) 01/18/2016   AST 134 (H) 01/18/2016   NA 137 07/01/2016   K 4.5 07/01/2016   CL 97 07/01/2016   CREATININE 1.05 07/01/2016   BUN 17 07/01/2016   CO2 23 07/01/2016   TSH 1.542 03/23/2015    Ct Abdomen Pelvis W Wo Contrast  Result Date: 01/23/2016 CLINICAL DATA:  [facility] Liver. Heptocellular dx vs fatty liver on ultrasound. Elevated liver functions and enzymes. Hx of  HTN,COPD,cholecystectomy EXAM: CT ABDOMEN AND PELVIS WITHOUT AND WITH CONTRAST TECHNIQUE: Multidetector CT imaging of the abdomen and pelvis was performed following the standard protocol before and following the bolus administration of intravenous contrast. CONTRAST:  157mL OMNIPAQUE IOHEXOL 300 MG/ML  SOLN COMPARISON:  Ultrasound 01/15/2016,  CT 05/01/2015 FINDINGS: Lower chest: Lung bases are clear. Hepatobiliary: Low-attenuation liver suggests hepatic steatosis. Arterial phase imaging demonstrates no focal enhancing lesion. Patient status post cholecystectomy. Portal venous phase imaging demonstrates a subcapsular enhancement in the anterior LEFT hepatic lobe (image 42, series 7) in a vascular pattern suggesting perfusion anomaly (image 42, series 7). Portal veins are patent. Pancreas: Pancreas is normal. No ductal dilatation. No pancreatic inflammation. Spleen: Normal spleen Adrenals/urinary tract: Adrenal glands and kidneys are normal. The ureters and bladder normal. Stomach/Bowel: Stomach, small bowel, appendix, and cecum are normal. The colon and rectosigmoid colon are normal. Vascular/Lymphatic: Abdominal aorta is normal caliber with atherosclerotic calcification. There is no retroperitoneal or periportal lymphadenopathy. No pelvic lymphadenopathy. Reproductive: Prostate normal Other: No free fluid. Musculoskeletal: No aggressive osseous lesion. IMPRESSION: 1. Minimal hepatic steatosis. No worsened hepatic lesion. Normal biliary tree. Post cholecystectomy. 2.  Atherosclerotic calcification of the abdominal aorta. Electronically Signed   By: Suzy Bouchard M.D.   On: 01/23/2016 09:18    Assessment & Plan:   Rochelle was seen today for medication refill.  Diagnoses and all orders for this visit:  COPD GOLD II still smoking  Encounter for immunization -     Flu Vaccine QUAD 36+ mos IM  Essential hypertension, benign      I have discontinued Mr. Aarons's celecoxib. I am also having him  maintain his albuterol, losartan, SYMBICORT, furosemide, indomethacin, amLODipine, spironolactone, KLOR-CON M20, and allopurinol.  No orders of the defined types were placed in this encounter.    Follow-up: Return in about 6 months (around 04/14/2017) for COPD, hypertension, Wellness.  [name], M.D.

## 2016-10-15 ENCOUNTER — Other Ambulatory Visit: Payer: Self-pay | Admitting: Physician Assistant

## 2016-10-15 MED ORDER — TIOTROPIUM BROMIDE MONOHYDRATE 18 MCG IN CAPS: 18.0000 ug | ORAL_CAPSULE | Freq: Once | RESPIRATORY_TRACT | 3 refills | Status: DC

## 2016-10-16 MED ORDER — BUDESONIDE-FORMOTEROL FUMARATE 160-4.5 MCG/ACT IN AERO: 2.0000 | INHALATION_SPRAY | Freq: Two times a day (BID) | RESPIRATORY_TRACT | 3 refills | Status: DC

## 2016-10-17 MED ORDER — FUROSEMIDE 40 MG PO TABS: ORAL_TABLET | ORAL | 0 refills | Status: DC

## 2016-10-17 MED ORDER — POTASSIUM CHLORIDE CRYS ER 20 MEQ PO TBCR: 20.0000 meq | EXTENDED_RELEASE_TABLET | Freq: Every day | ORAL | 1 refills | Status: DC

## 2016-10-17 MED ORDER — ALLOPURINOL 100 MG PO TABS: ORAL_TABLET | ORAL | 1 refills | Status: DC

## 2016-10-17 MED ORDER — LOSARTAN POTASSIUM 100 MG PO TABS: ORAL_TABLET | ORAL | 1 refills | Status: DC

## 2016-10-17 MED ORDER — SPIRONOLACTONE 25 MG PO TABS: 25.0000 mg | ORAL_TABLET | Freq: Every day | ORAL | 0 refills | Status: DC

## 2016-10-17 MED ORDER — AMLODIPINE BESYLATE 10 MG PO TABS: ORAL_TABLET | ORAL | 1 refills | Status: DC

## 2016-10-17 MED ORDER — INDOMETHACIN 50 MG PO CAPS: ORAL_CAPSULE | ORAL | 0 refills | Status: DC

## 2016-10-18 ENCOUNTER — Other Ambulatory Visit: Payer: Self-pay | Admitting: Family Medicine

## 2016-10-26 ENCOUNTER — Telehealth: Payer: Self-pay | Admitting: Family Medicine

## 2016-10-26 NOTE — Telephone Encounter (Signed)
Per patient , you was going to give him a 90 day refill on his celecoxib 400mg  daily.  He has a script for a 30 day supply at [facility] but needs it changed to 90.   At his last visit you documented that this medication was discontinued.  Please advise.

## 2016-10-26 NOTE — Telephone Encounter (Signed)
Aware, script for 90 day supply and one refill called to [facility] voice mail.

## 2016-10-26 NOTE — Telephone Encounter (Signed)
Okay at 400 mg daily for 6 mos. Thanks ws

## 2016-10-29 ENCOUNTER — Other Ambulatory Visit: Payer: Self-pay | Admitting: Family Medicine

## 2016-12-30 ENCOUNTER — Ambulatory Visit (INDEPENDENT_AMBULATORY_CARE_PROVIDER_SITE_OTHER): Payer: BLUE CROSS/BLUE SHIELD | Admitting: Nurse Practitioner

## 2016-12-30 ENCOUNTER — Encounter: Payer: Self-pay | Admitting: Nurse Practitioner

## 2016-12-30 VITALS — BP 99/64 | HR 84 | Temp 97.4°F | Ht 71.0 in | Wt 297.0 lb

## 2016-12-30 DIAGNOSIS — L600 Ingrowing nail: Secondary | ICD-10-CM | POA: Diagnosis not present

## 2016-12-30 NOTE — Progress Notes (Signed)
   Subjective:    Patient ID: Juan Merritt, male    DOB: 1951-01-15, 66 y.o.   MRN: [id number]  HPI Patient comes in today with his wife c/o ingrown toenail on right great toe. Has been hurting for a few days. No reddness or drainage. Has ben doing epsom salt soaks.    Review of Systems  Constitutional: Negative.   Respiratory: Negative.   Neurological: Negative.   Psychiatric/Behavioral: Negative.   All other systems reviewed and are negative.      Objective:   Physical Exam  Constitutional: He is oriented to person, place, and time. He appears well-developed and well-nourished. No distress.  Cardiovascular: Normal rate and normal heart sounds.   Pulmonary/Chest: Effort normal and breath sounds normal.  Neurological: He is alert and oriented to person, place, and time.  Skin: Skin is warm.  Medial side og great toe mld erythema- slightly tender to touch. No drainage.   BP 99/64   Pulse 84   Temp 97.4 F (36.3 C) (Oral)   Ht 5\' 11"  (1.803 m)   Wt 297 lb (134.7 kg)   BMI 41.42 kg/m   Wedging left great toenail- piece of cotton q tip pushed under edge of ingron nail.     Assessment & Plan:  1. Ingrown toenail without infection Wedging- discussed how to care and change wedge RTO prn  Mary-Margaret Hassell Done, FNP

## 2016-12-30 NOTE — Patient Instructions (Signed)
Ingrown Toenail An ingrown toenail occurs when the corner or sides of your toenail grow into the surrounding skin. The big toe is most commonly affected, but it can happen to any of your toes. If your ingrown toenail is not treated, you will be at risk for infection. What are the causes? This condition may be caused by:  Wearing shoes that are too small or tight.  Injury or trauma, such as stubbing your toe or having your toe stepped on.  Improper cutting or care of your toenails.  Being born with (congenital) nail or foot abnormalities, such as having a nail that is too big for your toe. What increases the risk? Risk factors for an ingrown toenail include:  Age. Your nails tend to thicken as you get older, so ingrown nails are more common in older people.  Diabetes.  Cutting your toenails incorrectly.  Blood circulation problems. What are the signs or symptoms? Symptoms may include:  Pain, soreness, or tenderness.  Redness.  Swelling.  Hardening of the skin surrounding the toe. Your ingrown toenail may be infected if there is fluid, pus, or drainage. How is this diagnosed? An ingrown toenail may be diagnosed by medical history and physical exam. If your toenail is infected, your health care provider may test a sample of the drainage. How is this treated? Treatment depends on the severity of your ingrown toenail. Some ingrown toenails may be treated at home. More severe or infected ingrown toenails may require surgery to remove all or part of the nail. Infected ingrown toenails may also be treated with antibiotic medicines. Follow these instructions at home:  If you were prescribed an antibiotic medicine, finish all of it even if you start to feel better.  Soak your foot in warm soapy water for 20 minutes, 3 times per day or as directed by your health care provider.  Carefully lift the edge of the nail away from the sore skin by wedging a small piece of cotton under the  corner of the nail. This may help with the pain. Be careful not to cause more injury to the area.  Wear shoes that fit well. If your ingrown toenail is causing you pain, try wearing sandals, if possible.  Trim your toenails regularly and carefully. Do not cut them in a curved shape. Cut your toenails straight across. This prevents injury to the skin at the corners of the toenail.  Keep your feet clean and dry.  If you are having trouble walking and are given crutches by your health care provider, use them as directed.  Do not pick at your toenail or try to remove it yourself.  Take medicines only as directed by your health care provider.  Keep all follow-up visits as directed by your health care provider. This is important. Contact a health care provider if:  Your symptoms do not improve with treatment. Get help right away if:  You have red streaks that start at your foot and go up your leg.  You have a fever.  You have increased redness, swelling, or pain.  You have fluid, blood, or pus coming from your toenail. This information is not intended to replace advice given to you by your health care provider. Make sure you discuss any questions you have with your health care provider. Document Released: 12/02/2000 Document Revised: 05/06/2016 Document Reviewed: 10/29/2014 Elsevier Interactive Patient Education  2017 [employer]  

## 2017-01-26 IMAGING — CR DG SHOULDER 2+V*R*
3 series · 3 of 3 positions shown · non-contrast
Comparison: None.

CLINICAL DATA: Right shoulder injury 3 months ago

EXAM:
RIGHT SHOULDER - 2+ VIEW

[view not recorded (1 of 3)]
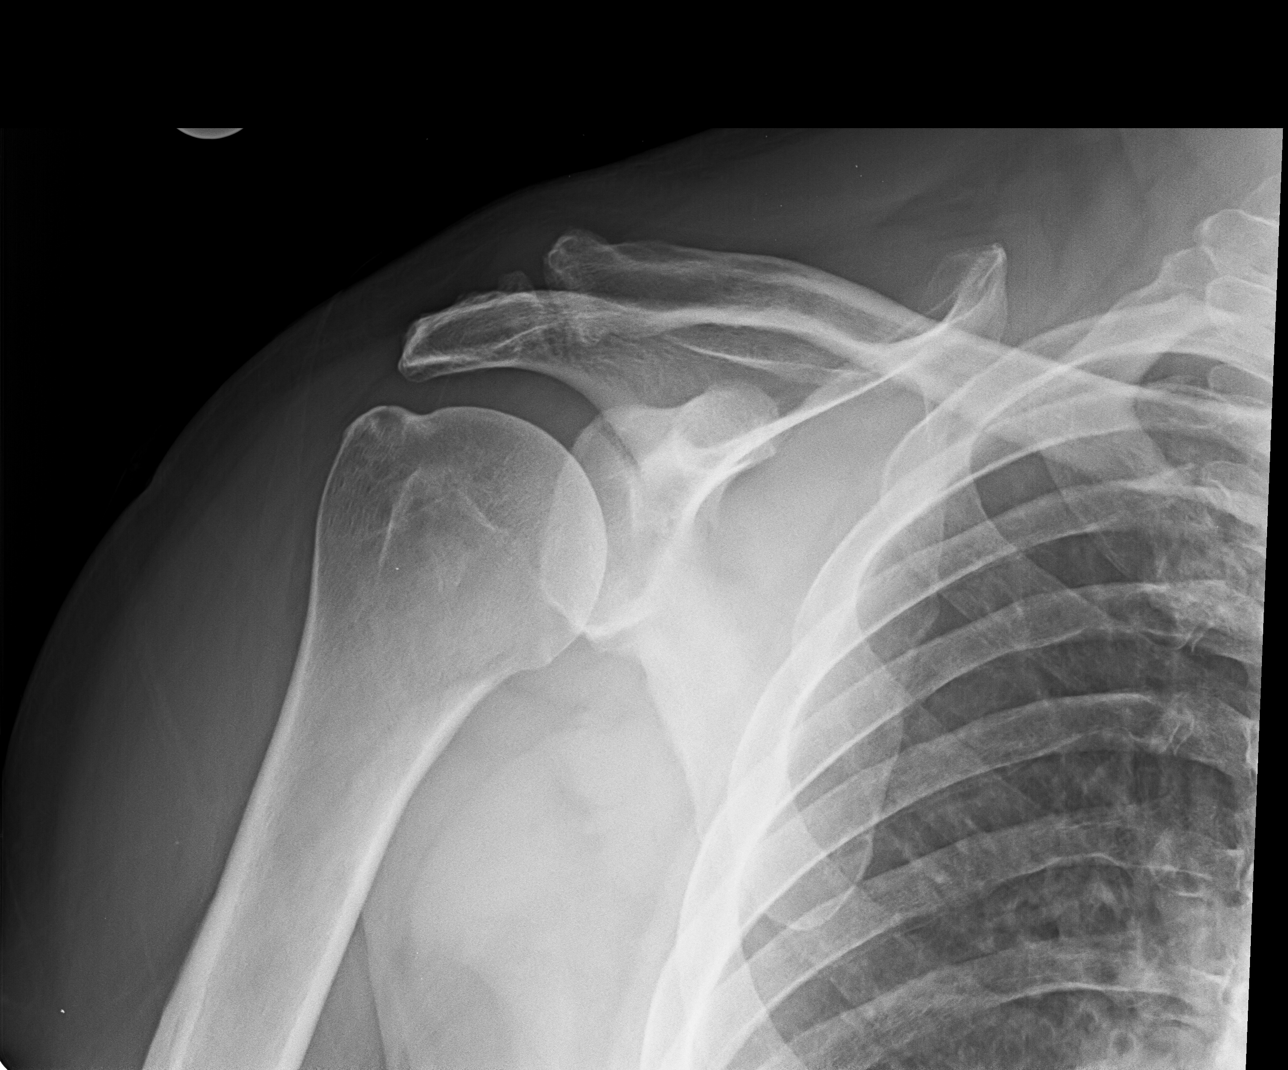

[view not recorded (2 of 3)]
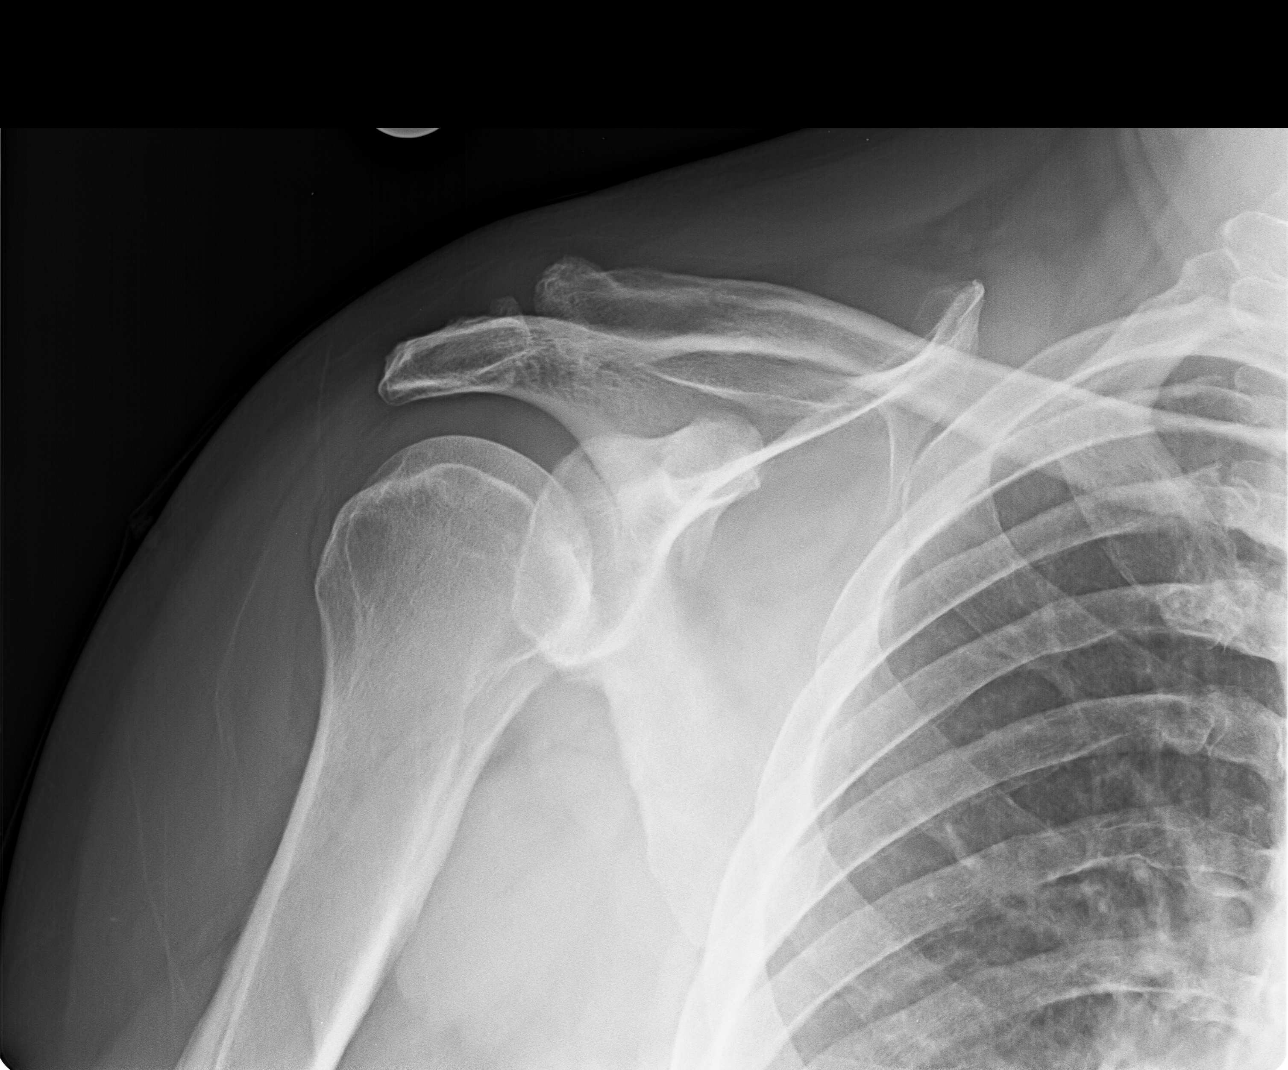

[view not recorded (3 of 3)]
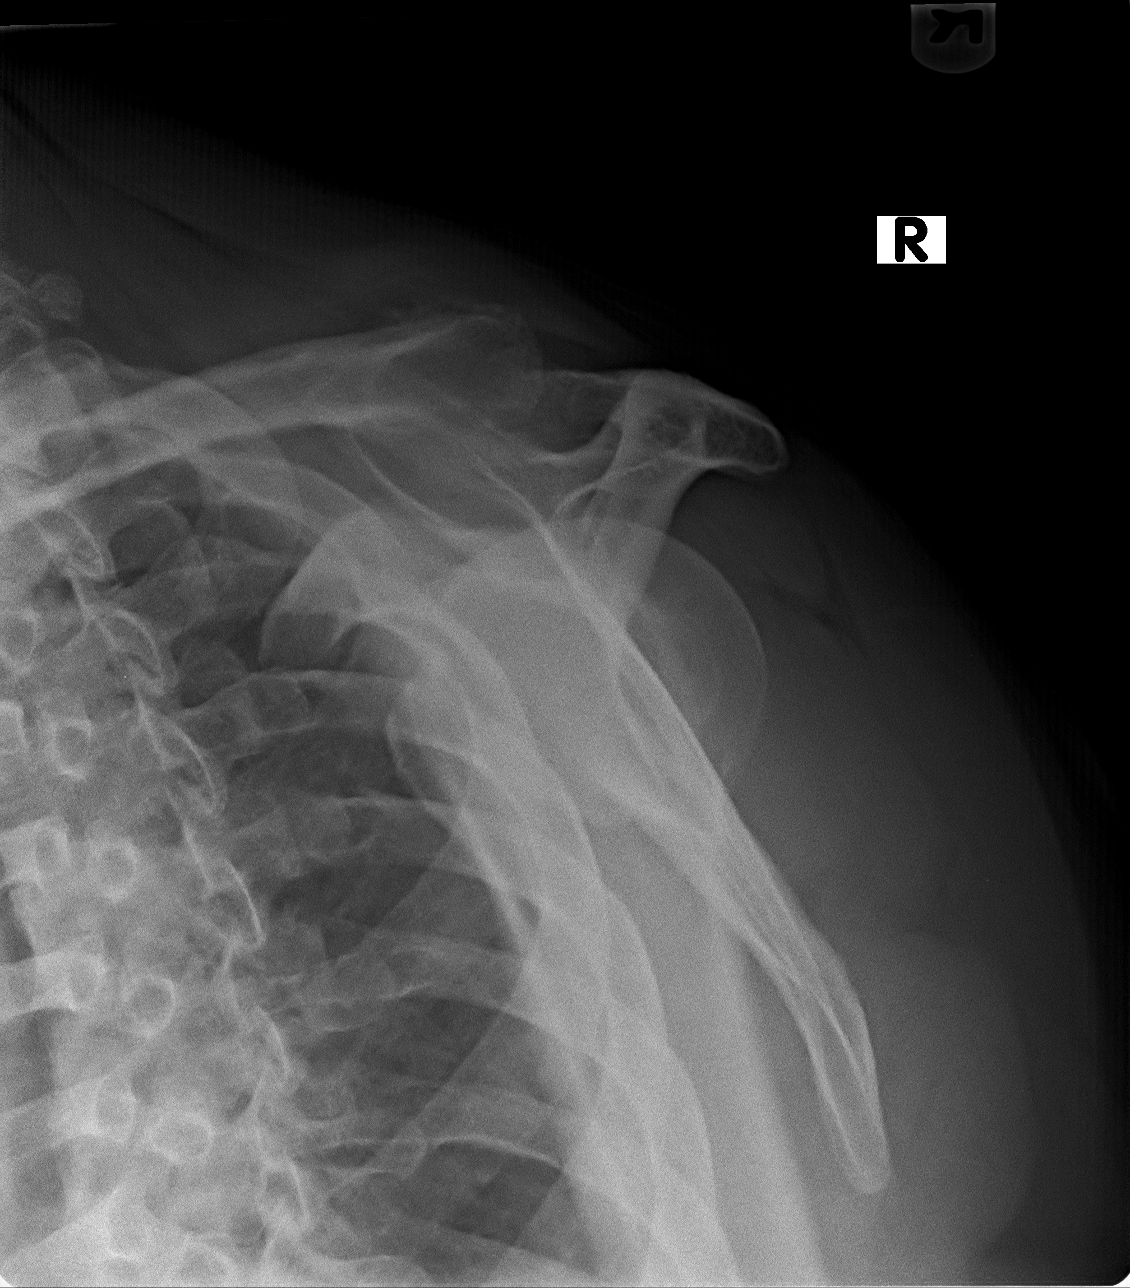

[3 of 3 positions shown; findings below may reference images not displayed]

FINDINGS: No acute fracture. No dislocation. Unremarkable soft tissues.
Moderate degenerative change of the AC joint with inferior
osteophytes.
IMPRESSION: No acute bony pathology.

## 2017-04-18 ENCOUNTER — Other Ambulatory Visit: Payer: Self-pay | Admitting: Family Medicine

## 2017-06-30 ENCOUNTER — Other Ambulatory Visit: Payer: Self-pay | Admitting: Family Medicine

## 2017-07-11 ENCOUNTER — Ambulatory Visit: Payer: BLUE CROSS/BLUE SHIELD | Admitting: Family

## 2017-07-14 ENCOUNTER — Other Ambulatory Visit: Payer: Self-pay | Admitting: Family Medicine

## 2017-07-17 ENCOUNTER — Other Ambulatory Visit: Payer: Self-pay | Admitting: Family Medicine

## 2017-07-18 ENCOUNTER — Ambulatory Visit (INDEPENDENT_AMBULATORY_CARE_PROVIDER_SITE_OTHER): Payer: BLUE CROSS/BLUE SHIELD

## 2017-07-18 ENCOUNTER — Ambulatory Visit (INDEPENDENT_AMBULATORY_CARE_PROVIDER_SITE_OTHER): Payer: BLUE CROSS/BLUE SHIELD | Admitting: Family Medicine

## 2017-07-18 ENCOUNTER — Encounter: Payer: Self-pay | Admitting: Family Medicine

## 2017-07-18 VITALS — BP 111/68 | HR 85 | Temp 97.6°F | Ht 71.0 in | Wt 299.0 lb

## 2017-07-18 DIAGNOSIS — J449 Chronic obstructive pulmonary disease, unspecified: Secondary | ICD-10-CM | POA: Diagnosis not present

## 2017-07-18 DIAGNOSIS — I10 Essential (primary) hypertension: Secondary | ICD-10-CM

## 2017-07-18 DIAGNOSIS — M25512 Pain in left shoulder: Secondary | ICD-10-CM | POA: Diagnosis not present

## 2017-07-18 DIAGNOSIS — R635 Abnormal weight gain: Secondary | ICD-10-CM

## 2017-07-18 DIAGNOSIS — R0609 Other forms of dyspnea: Secondary | ICD-10-CM

## 2017-07-18 DIAGNOSIS — M25511 Pain in right shoulder: Secondary | ICD-10-CM

## 2017-07-18 DIAGNOSIS — R600 Localized edema: Secondary | ICD-10-CM

## 2017-07-18 DIAGNOSIS — M791 Myalgia, unspecified site: Secondary | ICD-10-CM

## 2017-07-18 DIAGNOSIS — M1A9XX0 Chronic gout, unspecified, without tophus (tophi): Secondary | ICD-10-CM | POA: Diagnosis not present

## 2017-07-18 DIAGNOSIS — J441 Chronic obstructive pulmonary disease with (acute) exacerbation: Secondary | ICD-10-CM

## 2017-07-18 DIAGNOSIS — G8929 Other chronic pain: Secondary | ICD-10-CM | POA: Diagnosis not present

## 2017-07-18 MED ORDER — ALBUTEROL SULFATE (2.5 MG/3ML) 0.083% IN NEBU: 2.5000 mg | INHALATION_SOLUTION | Freq: Four times a day (QID) | RESPIRATORY_TRACT | 1 refills | Status: DC | PRN

## 2017-07-18 MED ORDER — CELECOXIB 400 MG PO CAPS: 400.0000 mg | ORAL_CAPSULE | Freq: Every day | ORAL | 1 refills | Status: DC

## 2017-07-18 MED ORDER — LEVOFLOXACIN 500 MG PO TABS: 500.0000 mg | ORAL_TABLET | Freq: Every day | ORAL | 0 refills | Status: DC

## 2017-07-18 MED ORDER — INDOMETHACIN 50 MG PO CAPS: ORAL_CAPSULE | ORAL | 0 refills | Status: DC

## 2017-07-18 MED ORDER — PREDNISONE 10 MG PO TABS: ORAL_TABLET | ORAL | 0 refills | Status: DC

## 2017-07-18 MED ORDER — BUDESONIDE-FORMOTEROL FUMARATE 160-4.5 MCG/ACT IN AERO: 2.0000 | INHALATION_SPRAY | Freq: Two times a day (BID) | RESPIRATORY_TRACT | 3 refills | Status: DC

## 2017-07-18 MED ORDER — ALLOPURINOL 100 MG PO TABS: ORAL_TABLET | ORAL | 1 refills | Status: DC

## 2017-07-18 MED ORDER — AMLODIPINE BESYLATE 10 MG PO TABS: ORAL_TABLET | ORAL | 1 refills | Status: DC

## 2017-07-18 MED ORDER — POTASSIUM CHLORIDE CRYS ER 20 MEQ PO TBCR: 20.0000 meq | EXTENDED_RELEASE_TABLET | Freq: Every day | ORAL | 1 refills | Status: DC

## 2017-07-18 NOTE — Addendum Note (Signed)
Addended by: Marylin Crosby on: 07/18/2017 02:35 PM   Modules accepted: Orders

## 2017-07-18 NOTE — Addendum Note (Signed)
Addended by: [name] on: 07/18/2017 09:06 AM   Modules accepted: Orders, Level of Service

## 2017-07-18 NOTE — Progress Notes (Addendum)
Subjective:  Patient ID: Juan Merritt, male    DOB: 12-25-1950  Age: 66 y.o. MRN: [id number]  CC: COPD (pt here today c/o muscles aching and incresed SOB with exertion)   HPI Juan Merritt presents for More shortness of breath but not been using the Symbicort. He is using the nebulizer more because of the shortness of breath and discontinued the Symbicort instead. He says he's still using the little red inhaler though. Last week in particular he had some tightness across his chest. He's also had ongoing bilateral shoulder pain he does say that there was some temporal relationship to those last week. He's not had a cough. He does get dyspneic rather easily. Sometimes just at rest. His wife expresses significant concern about his weight change and swelling. She has increased his use of his nebulizer. Significant history given today by her as well as the patient.  Patient also is having significant aches in all 4 extremities. This is not unilateral in any way. It is constant rather than episodic. It is not related to activity. He does continue to take furosemide and potassium and spironolactone. He's had shoulder pain on the left particularly when he lays on it. Of note is that he's had rotator cuff impingement in the past on the right but not on the left. He and his wife are concerned about his weight gain. Of note from chart review is that he has only put on 2 pounds in the last 6 months however in the last year he has put on 21 pounds. In the last 4 years he has put on  88 pounds.  Depression screen Juan Merritt 2/9 07/18/2017 12/30/2016 10/14/2016  Decreased Interest 0 0 0  Down, Depressed, Hopeless 0 0 0  PHQ - 2 Score 0 0 0    History Juan Merritt has a past medical history of Arthritis; COPD (chronic obstructive pulmonary disease) ([place]); Gallstones; Gout; Hypertension; Obesity; and Pneumonia.   He has a past surgical history that includes Cholecystectomy and Tonsillectomy.   His family history  includes Asthma in his mother; Congestive Heart Failure in his mother; Diabetes in his father; Esophageal cancer in his paternal uncle; Heart attack in his unknown relative; Heart disease in his mother; Leukemia in his brother; Lung disease in his unknown relative.He reports that he has been smoking Cigarettes.  He has a 10.00 pack-year smoking history. His smokeless tobacco use includes Chew. He reports that he drinks about 6.0 oz of alcohol per week . He reports that he does not use drugs.    ROS Review of Systems  Constitutional: Negative for chills, diaphoresis, fever and unexpected weight change.  HENT: Negative for congestion, hearing loss, rhinorrhea and sore throat.   Eyes: Negative for visual disturbance.  Respiratory: Positive for shortness of breath. Negative for cough.   Cardiovascular: Negative for chest pain.  Gastrointestinal: Negative for abdominal pain, constipation and diarrhea.  Genitourinary: Negative for dysuria and flank pain.  Musculoskeletal: Positive for arthralgias. Negative for joint swelling.  Skin: Negative for rash.  Neurological: Negative for dizziness and headaches.  Psychiatric/Behavioral: Negative for dysphoric mood and sleep disturbance.    Objective:  BP 111/68   Pulse 85   Temp 97.6 F (36.4 C) (Oral)   Ht 5' 11" (1.803 m)   Wt 299 lb (135.6 kg)   SpO2 91%   BMI 41.70 kg/m   BP Readings from Last 3 Encounters:  07/18/17 111/68  12/30/16 99/64  10/14/16 104/67    Wt Readings  from Last 3 Encounters:  07/18/17 299 lb (135.6 kg)  12/30/16 297 lb (134.7 kg)  10/14/16 282 lb 6 oz (128.1 kg)     Physical Exam  Constitutional: He is oriented to person, place, and time. He appears well-developed and well-nourished. No distress.  HENT:  Head: Normocephalic and atraumatic.  Right Ear: External ear normal.  Left Ear: External ear normal.  Nose: Nose normal.  Mouth/Throat: Oropharynx is clear and moist.  Eyes: Pupils are equal, round, and  reactive to light. Conjunctivae and EOM are normal.  Neck: Normal range of motion. Neck supple. No thyromegaly present.  Cardiovascular: Normal rate, regular rhythm and normal heart sounds.   No murmur heard. Pulmonary/Chest: Effort normal and breath sounds normal. No respiratory distress. He has no wheezes. He has no rales.  Abdominal: Soft. Bowel sounds are normal. He exhibits no distension. There is no tenderness.  Musculoskeletal: Normal range of motion. He exhibits tenderness (left shoulder).  Lymphadenopathy:    He has no cervical adenopathy.  Neurological: He is alert and oriented to person, place, and time. He has normal reflexes.  Skin: Skin is warm and dry.  Psychiatric: He has a normal mood and affect. His behavior is normal. Judgment and thought content normal.      Assessment & Plan:   Juan Merritt was seen today for copd.  Diagnoses and all orders for this visit:  COPD GOLD II still smoking -     CBC with Differential/Platelet -     CMP14+EGFR -     DG Chest 2 View; Future -     PR BREATHING CAPACITY TEST -     D-dimer, quantitative (not at [facility])  Essential hypertension, benign -     CBC with Differential/Platelet -     CMP14+EGFR  Chronic gout without tophus, unspecified cause, unspecified site -     CBC with Differential/Platelet -     CMP14+EGFR  Localized edema -     CBC with Differential/Platelet -     CMP14+EGFR -     TSH -     Brain natriuretic peptide  Myalgia -     CBC with Differential/Platelet -     CMP14+EGFR -     TSH -     Arthritis Panel -     Sedimentation rate -     Uric acid  Chronic pain of both shoulders -     CBC with Differential/Platelet -     CMP14+EGFR  Abnormal weight gain -     CBC with Differential/Platelet -     CMP14+EGFR -     TSH  Other form of dyspnea  Other orders -     levofloxacin (LEVAQUIN) 500 MG tablet; Take 1 tablet (500 mg total) by mouth daily. For 10 days -     predniSONE (DELTASONE) 10 MG tablet; Take  5 daily for 3 days followed by 4,3,2 and 1 for 3 days each.       I am having Mr. Juan Merritt start on levofloxacin and predniSONE. I am also having him maintain his albuterol, budesonide-formoterol, allopurinol, furosemide, indomethacin, KLOR-CON M20, celecoxib, amLODipine, losartan, and spironolactone.  Allergies as of 07/18/2017   No Known Allergies     Medication List       Accurate as of 07/18/17  9:06 AM. Always use your most recent med list.          albuterol (2.5 MG/3ML) 0.083% nebulizer solution Commonly known as:  PROVENTIL Take 3 mLs (2.5 mg total) by   nebulization every 6 (six) hours as needed for wheezing or shortness of breath.   allopurinol 100 MG tablet Commonly known as:  ZYLOPRIM TAKE 1 TABLET (100 MG TOTAL) BY MOUTH DAILY. TO PREVENT GOUT   amLODipine 10 MG tablet Commonly known as:  NORVASC TAKE 1 TABLET (10 MG TOTAL) BY MOUTH DAILY.   budesonide-formoterol 160-4.5 MCG/ACT inhaler Commonly known as:  SYMBICORT Inhale 2 puffs into the lungs 2 (two) times daily.   celecoxib 400 MG capsule Commonly known as:  CELEBREX TAKE 1 CAPSULE EVERY DAY   furosemide 40 MG tablet Commonly known as:  LASIX TAKE 1 TABLET (40 MG TOTAL) BY MOUTH 2 (TWO) TIMES DAILY. ONE AT BREAKFAST AND LUNCH   indomethacin 50 MG capsule Commonly known as:  INDOCIN TAKE 1 CAPSULE BY MOUTH THREE TIMES A DAY AS NEEDED   KLOR-CON M20 20 MEQ tablet Generic drug:  potassium chloride SA TAKE 1 TABLET (20 MEQ TOTAL) BY MOUTH DAILY.   levofloxacin 500 MG tablet Commonly known as:  LEVAQUIN Take 1 tablet (500 mg total) by mouth daily. For 10 days   losartan 100 MG tablet Commonly known as:  COZAAR TAKE 1 TABLET (100 MG TOTAL) BY MOUTH DAILY.   predniSONE 10 MG tablet Commonly known as:  DELTASONE Take 5 daily for 3 days followed by 4,3,2 and 1 for 3 days each.   spironolactone 25 MG tablet Commonly known as:  ALDACTONE TAKE 1 TABLET (25 MG TOTAL) BY MOUTH DAILY.     PFT shows  6% decline in FEV1/FVC ratio over the last year. Chest x-ray shows COPD with possible infiltrate in right lower lobe.   Follow-up: Return in about 2 weeks (around 08/01/2017).  [name], M.D.

## 2017-07-19 LAB — CMP14+EGFR
A/G RATIO: 1.4 (ref 1.2–2.2)
ALT: 40 IU/L (ref 0–44)
AST: 31 IU/L (ref 0–40)
Albumin: 4.2 g/dL (ref 3.6–4.8)
Alkaline Phosphatase: 53 IU/L (ref 39–117)
BUN/Creatinine Ratio: 19 (ref 10–24)
BUN: 21 mg/dL (ref 8–27)
Bilirubin Total: 0.4 mg/dL (ref 0.0–1.2)
CALCIUM: 9.4 mg/dL (ref 8.6–10.2)
CHLORIDE: 99 mmol/L (ref 96–106)
CO2: 24 mmol/L (ref 20–29)
Creatinine, Ser: 1.11 mg/dL (ref 0.76–1.27)
GFR, EST AFRICAN AMERICAN: 81 mL/min/{1.73_m2} (ref >59)
GFR, EST NON AFRICAN AMERICAN: 70 mL/min/{1.73_m2} (ref >59)
GLUCOSE: 114 mg/dL — AB (ref 65–99)
Globulin, Total: 2.9 g/dL (ref 1.5–4.5)
Potassium: 4.6 mmol/L (ref 3.5–5.2)
Sodium: 140 mmol/L (ref 134–144)
TOTAL PROTEIN: 7.1 g/dL (ref 6.0–8.5)

## 2017-07-19 LAB — BRAIN NATRIURETIC PEPTIDE: BNP: 12.5 pg/mL (ref 0.0–100.0)

## 2017-07-19 LAB — ARTHRITIS PANEL
BASOS: 0 %
Basophils Absolute: 0 10*3/uL (ref 0.0–0.2)
EOS (ABSOLUTE): 0.2 10*3/uL (ref 0.0–0.4)
Eos: 3 %
HEMOGLOBIN: 14.1 g/dL (ref 13.0–17.7)
Hematocrit: 39.9 % (ref 37.5–51.0)
IMMATURE GRANULOCYTES: 0 %
Immature Grans (Abs): 0 10*3/uL (ref 0.0–0.1)
Lymphocytes Absolute: 2.9 10*3/uL (ref 0.7–3.1)
Lymphs: 36 %
MCH: 32.6 pg (ref 26.6–33.0)
MCHC: 35.3 g/dL (ref 31.5–35.7)
MCV: 92 fL (ref 79–97)
MONOS ABS: 0.9 10*3/uL (ref 0.1–0.9)
Monocytes: 11 %
NEUTROS PCT: 50 %
Neutrophils Absolute: 3.9 10*3/uL (ref 1.4–7.0)
Platelets: 264 10*3/uL (ref 150–379)
RBC: 4.32 x10E6/uL (ref 4.14–5.80)
RDW: 13.4 % (ref 12.3–15.4)
Sed Rate: 16 mm/hr (ref 0–30)
URIC ACID: 7.9 mg/dL (ref 3.7–8.6)
WBC: 7.9 10*3/uL (ref 3.4–10.8)

## 2017-07-19 LAB — TSH: TSH: 1.92 u[IU]/mL (ref 0.450–4.500)

## 2017-07-19 LAB — D-DIMER, QUANTITATIVE: D-DIMER: 0.6 mg/L FEU — ABNORMAL HIGH (ref 0.00–0.49)

## 2017-07-21 ENCOUNTER — Telehealth: Payer: Self-pay | Admitting: Family Medicine

## 2017-07-31 ENCOUNTER — Encounter: Payer: Self-pay | Admitting: Family Medicine

## 2017-07-31 ENCOUNTER — Ambulatory Visit (INDEPENDENT_AMBULATORY_CARE_PROVIDER_SITE_OTHER): Payer: BLUE CROSS/BLUE SHIELD | Admitting: Family Medicine

## 2017-07-31 VITALS — BP 109/67 | HR 93 | Temp 97.4°F | Ht 71.0 in | Wt 299.0 lb

## 2017-07-31 DIAGNOSIS — J449 Chronic obstructive pulmonary disease, unspecified: Secondary | ICD-10-CM

## 2017-07-31 DIAGNOSIS — R7301 Impaired fasting glucose: Secondary | ICD-10-CM

## 2017-07-31 DIAGNOSIS — J441 Chronic obstructive pulmonary disease with (acute) exacerbation: Secondary | ICD-10-CM

## 2017-07-31 LAB — BAYER DCA HB A1C WAIVED: HB A1C (BAYER DCA - WAIVED): 6.6 % (ref <7.0)

## 2017-07-31 NOTE — Progress Notes (Signed)
Subjective:  Patient ID: Juan Merritt, male    DOB: 09-May-1951  Age: 66 y.o. MRN: [id number]  CC: Follow-up (pt here today for a 2 week f/u and states he is feeling better. He has one more Prednisone left and he has completed Levaquin.)   HPI Juan Merritt presents for Recheck of his COPD. He is continuing to improve with regard to shortness of breath. He is having minimal cough only. It is nonproductive. His shortness of breath has decreased significantly. He has finished his antibiotics. He is able to perform his regular activities.  Depression screen [facility] 2/9 07/31/2017 07/18/2017 12/30/2016  Decreased Interest 0 0 0  Down, Depressed, Hopeless 0 0 0  PHQ - 2 Score 0 0 0    History Juan Merritt has a past medical history of Arthritis; COPD (chronic obstructive pulmonary disease) ([place]); Gallstones; Gout; Hypertension; Obesity; and Pneumonia.   He has a past surgical history that includes Cholecystectomy and Tonsillectomy.   His family history includes Asthma in his mother; Congestive Heart Failure in his mother; Diabetes in his father; Esophageal cancer in his paternal uncle; Heart attack in his unknown relative; Heart disease in his mother; Leukemia in his brother; Lung disease in his unknown relative.He reports that he has been smoking Cigarettes.  He has a 10.00 pack-year smoking history. His smokeless tobacco use includes Chew. He reports that he drinks about 6.0 oz of alcohol per week . He reports that he does not use drugs.    ROS Review of Systems  Constitutional: Negative for chills, diaphoresis and fever.  HENT: Negative for rhinorrhea and sore throat.   Respiratory:       See history of present illness  Cardiovascular: Negative for chest pain.  Gastrointestinal: Negative for abdominal pain.  Musculoskeletal: Negative for arthralgias and myalgias.  Skin: Negative for rash.  Neurological: Negative for weakness and headaches.    Objective:  BP 109/67   Pulse 93   Temp  (!) 97.4 F (36.3 C) (Oral)   Ht _0  (1.803 m)   Wt 299 lb (135.6 kg)   BMI 41.70 kg/m   BP Readings from Last 3 Encounters:  07/31/17 109/67  07/18/17 111/68  12/30/16 99/64    Wt Readings from Last 3 Encounters:  07/31/17 299 lb (135.6 kg)  07/18/17 299 lb (135.6 kg)  12/30/16 297 lb (134.7 kg)     Physical Exam  Constitutional: He appears well-developed and well-nourished.  HENT:  Head: Normocephalic and atraumatic.  Right Ear: Tympanic membrane and external ear normal. No decreased hearing is noted.  Left Ear: Tympanic membrane and external ear normal. No decreased hearing is noted.  Mouth/Throat: No oropharyngeal exudate or posterior oropharyngeal erythema.  Eyes: Pupils are equal, round, and reactive to light.  Neck: Normal range of motion. Neck supple.  Cardiovascular: Normal rate and regular rhythm.   No murmur heard. Pulmonary/Chest: Breath sounds normal. No respiratory distress.  Abdominal: Soft. Bowel sounds are normal. He exhibits no mass. There is no tenderness.  Vitals reviewed.     Assessment & Plan:   Juan Merritt was seen today for follow-up.  Diagnoses and all orders for this visit:  COPD exacerbation ([place]) -     CMP14+EGFR -     CBC with Differential/Platelet -     Bayer DCA Hb A1c Waived  Elevated fasting glucose -     CMP14+EGFR -     Bayer DCA Hb A1c Waived  COPD GOLD II still smoking    Patient  was once again counseled on smoking cessation. Seems to be contemplating still.   I have discontinued Juan Merritt's levofloxacin and predniSONE. I am also having him maintain his furosemide, losartan, spironolactone, albuterol, allopurinol, amLODipine, budesonide-formoterol, celecoxib, indomethacin, and potassium chloride SA.  Allergies as of 07/31/2017   No Known Allergies     Medication List       Accurate as of 07/31/17  5:31 PM. Always use your most recent med list.          albuterol (2.5 MG/3ML) 0.083% nebulizer  solution Commonly known as:  PROVENTIL Take 3 mLs (2.5 mg total) by nebulization every 6 (six) hours as needed for wheezing or shortness of breath.   allopurinol 100 MG tablet Commonly known as:  ZYLOPRIM TAKE 1 TABLET (100 MG TOTAL) BY MOUTH DAILY. TO PREVENT GOUT   amLODipine 10 MG tablet Commonly known as:  NORVASC TAKE 1 TABLET (10 MG TOTAL) BY MOUTH DAILY.   budesonide-formoterol 160-4.5 MCG/ACT inhaler Commonly known as:  SYMBICORT Inhale 2 puffs into the lungs 2 (two) times daily.   celecoxib 400 MG capsule Commonly known as:  CELEBREX Take 1 capsule (400 mg total) by mouth daily.   furosemide 40 MG tablet Commonly known as:  LASIX TAKE 1 TABLET (40 MG TOTAL) BY MOUTH 2 (TWO) TIMES DAILY. ONE AT BREAKFAST AND LUNCH   indomethacin 50 MG capsule Commonly known as:  INDOCIN TAKE 1 CAPSULE BY MOUTH THREE TIMES A DAY AS NEEDED   losartan 100 MG tablet Commonly known as:  COZAAR TAKE 1 TABLET (100 MG TOTAL) BY MOUTH DAILY.   potassium chloride SA 20 MEQ tablet Commonly known as:  KLOR-CON M20 Take 1 tablet (20 mEq total) by mouth daily.   spironolactone 25 MG tablet Commonly known as:  ALDACTONE TAKE 1 TABLET (25 MG TOTAL) BY MOUTH DAILY.        Follow-up: Return in about 3 months (around 10/31/2017).  [name], M.D.

## 2017-08-01 ENCOUNTER — Other Ambulatory Visit: Payer: Self-pay | Admitting: Family Medicine

## 2017-08-01 ENCOUNTER — Other Ambulatory Visit: Payer: Self-pay

## 2017-08-01 DIAGNOSIS — E119 Type 2 diabetes mellitus without complications: Secondary | ICD-10-CM

## 2017-08-01 LAB — CMP14+EGFR
ALBUMIN: 4.3 g/dL (ref 3.6–4.8)
ALT: 45 IU/L — AB (ref 0–44)
AST: 29 IU/L (ref 0–40)
Albumin/Globulin Ratio: 1.7 (ref 1.2–2.2)
Alkaline Phosphatase: 61 IU/L (ref 39–117)
BUN/Creatinine Ratio: 17 (ref 10–24)
BUN: 20 mg/dL (ref 8–27)
Bilirubin Total: 0.6 mg/dL (ref 0.0–1.2)
CALCIUM: 9.5 mg/dL (ref 8.6–10.2)
CO2: 28 mmol/L (ref 20–29)
CREATININE: 1.18 mg/dL (ref 0.76–1.27)
Chloride: 93 mmol/L — ABNORMAL LOW (ref 96–106)
GFR calc Af Amer: 75 mL/min/{1.73_m2} (ref >59)
GFR, EST NON AFRICAN AMERICAN: 65 mL/min/{1.73_m2} (ref >59)
Globulin, Total: 2.6 g/dL (ref 1.5–4.5)
Glucose: 147 mg/dL — ABNORMAL HIGH (ref 65–99)
Potassium: 4.5 mmol/L (ref 3.5–5.2)
Sodium: 138 mmol/L (ref 134–144)
Total Protein: 6.9 g/dL (ref 6.0–8.5)

## 2017-08-01 LAB — CBC WITH DIFFERENTIAL/PLATELET
BASOS: 0 %
Basophils Absolute: 0 10*3/uL (ref 0.0–0.2)
EOS (ABSOLUTE): 0.1 10*3/uL (ref 0.0–0.4)
EOS: 1 %
HEMATOCRIT: 42.5 % (ref 37.5–51.0)
Hemoglobin: 13.9 g/dL (ref 13.0–17.7)
IMMATURE GRANS (ABS): 0 10*3/uL (ref 0.0–0.1)
IMMATURE GRANULOCYTES: 0 %
LYMPHS: 16 %
Lymphocytes Absolute: 2.6 10*3/uL (ref 0.7–3.1)
MCH: 32.3 pg (ref 26.6–33.0)
MCHC: 32.7 g/dL (ref 31.5–35.7)
MCV: 99 fL — AB (ref 79–97)
MONOCYTES: 8 %
Monocytes Absolute: 1.3 10*3/uL — ABNORMAL HIGH (ref 0.1–0.9)
NEUTROS PCT: 75 %
Neutrophils Absolute: 11.7 10*3/uL — ABNORMAL HIGH (ref 1.4–7.0)
PLATELETS: 260 10*3/uL (ref 150–379)
RBC: 4.3 x10E6/uL (ref 4.14–5.80)
RDW: 14 % (ref 12.3–15.4)
WBC: 15.7 10*3/uL — ABNORMAL HIGH (ref 3.4–10.8)

## 2017-08-01 MED ORDER — METFORMIN HCL ER 500 MG PO TB24: 500.0000 mg | ORAL_TABLET | Freq: Every day | ORAL | 2 refills | Status: DC

## 2017-08-01 NOTE — Progress Notes (Signed)
Please contact the patient the educator's main purpose will be to help him with his diet if they would like to approach diabetes education from that standpoint.

## 2017-08-01 NOTE — Progress Notes (Unsigned)
FYI, patient was informed of lab results and told he was diabetic per your instruction, he was to start Metformin and see a diabetic educator.  The patient's wife called back and said he does not want to start Metformin or see an Tourist information centre manager.  He will change his diet and have his A1C rechecked in 3 months.

## 2017-08-14 ENCOUNTER — Other Ambulatory Visit: Payer: Self-pay | Admitting: Family Medicine

## 2017-08-14 NOTE — Telephone Encounter (Signed)
Patient aware of results 08/14

## 2017-08-20 ENCOUNTER — Other Ambulatory Visit: Payer: Self-pay | Admitting: Family Medicine

## 2017-09-10 ENCOUNTER — Other Ambulatory Visit: Payer: Self-pay | Admitting: Family Medicine

## 2017-09-14 NOTE — Progress Notes (Signed)
NA for 1 mo - notes closed

## 2017-09-24 ENCOUNTER — Other Ambulatory Visit: Payer: Self-pay | Admitting: Family Medicine

## 2017-10-19 ENCOUNTER — Other Ambulatory Visit: Payer: Self-pay | Admitting: Family Medicine

## 2017-10-19 ENCOUNTER — Other Ambulatory Visit: Payer: Self-pay | Admitting: Physician Assistant

## 2017-10-19 NOTE — Telephone Encounter (Signed)
Next Ov 10/27/17

## 2017-10-25 ENCOUNTER — Ambulatory Visit: Payer: BLUE CROSS/BLUE SHIELD | Admitting: Family Medicine

## 2017-10-27 ENCOUNTER — Ambulatory Visit: Payer: BLUE CROSS/BLUE SHIELD | Admitting: Family Medicine

## 2017-10-27 ENCOUNTER — Ambulatory Visit (INDEPENDENT_AMBULATORY_CARE_PROVIDER_SITE_OTHER): Payer: BLUE CROSS/BLUE SHIELD

## 2017-10-27 ENCOUNTER — Encounter: Payer: Self-pay | Admitting: Family Medicine

## 2017-10-27 ENCOUNTER — Telehealth: Payer: Self-pay | Admitting: *Deleted

## 2017-10-27 VITALS — BP 107/75 | HR 78 | Temp 97.4°F | Ht 71.0 in | Wt 287.0 lb

## 2017-10-27 DIAGNOSIS — M25571 Pain in right ankle and joints of right foot: Secondary | ICD-10-CM | POA: Diagnosis not present

## 2017-10-27 DIAGNOSIS — E119 Type 2 diabetes mellitus without complications: Secondary | ICD-10-CM | POA: Diagnosis not present

## 2017-10-27 DIAGNOSIS — I10 Essential (primary) hypertension: Secondary | ICD-10-CM

## 2017-10-27 DIAGNOSIS — F17210 Nicotine dependence, cigarettes, uncomplicated: Secondary | ICD-10-CM | POA: Diagnosis not present

## 2017-10-27 DIAGNOSIS — N419 Inflammatory disease of prostate, unspecified: Secondary | ICD-10-CM | POA: Diagnosis not present

## 2017-10-27 DIAGNOSIS — S92101A Unspecified fracture of right talus, initial encounter for closed fracture: Secondary | ICD-10-CM

## 2017-10-27 DIAGNOSIS — S92154A Nondisplaced avulsion fracture (chip fracture) of right talus, initial encounter for closed fracture: Secondary | ICD-10-CM

## 2017-10-27 DIAGNOSIS — J449 Chronic obstructive pulmonary disease, unspecified: Secondary | ICD-10-CM | POA: Diagnosis not present

## 2017-10-27 LAB — URINALYSIS
BILIRUBIN UA: NEGATIVE
Nitrite, UA: POSITIVE — AB
RBC UA: NEGATIVE
SPEC GRAV UA: 1.015 (ref 1.005–1.030)
UUROB: 2 mg/dL — AB (ref 0.2–1.0)
pH, UA: 6 (ref 5.0–7.5)

## 2017-10-27 MED ORDER — FUROSEMIDE 40 MG PO TABS: ORAL_TABLET | ORAL | 1 refills | Status: DC

## 2017-10-27 MED ORDER — BLOOD GLUCOSE MONITOR SYSTEM W/DEVICE KIT: 1.0000 | PACK | Freq: Two times a day (BID) | 0 refills | Status: DC

## 2017-10-27 MED ORDER — AMLODIPINE BESYLATE 10 MG PO TABS: ORAL_TABLET | ORAL | 1 refills | Status: DC

## 2017-10-27 MED ORDER — INDOMETHACIN 50 MG PO CAPS: ORAL_CAPSULE | ORAL | 0 refills | Status: DC

## 2017-10-27 MED ORDER — SPIRONOLACTONE 25 MG PO TABS: 25.0000 mg | ORAL_TABLET | Freq: Every day | ORAL | 1 refills | Status: DC

## 2017-10-27 MED ORDER — CIPROFLOXACIN HCL 500 MG PO TABS: 500.0000 mg | ORAL_TABLET | Freq: Two times a day (BID) | ORAL | 0 refills | Status: DC

## 2017-10-27 MED ORDER — CELECOXIB 400 MG PO CAPS: 400.0000 mg | ORAL_CAPSULE | Freq: Every day | ORAL | 1 refills | Status: DC

## 2017-10-27 MED ORDER — ONETOUCH ULTRASOFT LANCETS MISC: 12 refills | Status: DC

## 2017-10-27 MED ORDER — POTASSIUM CHLORIDE CRYS ER 20 MEQ PO TBCR: 20.0000 meq | EXTENDED_RELEASE_TABLET | Freq: Every day | ORAL | 1 refills | Status: DC

## 2017-10-27 MED ORDER — ALLOPURINOL 100 MG PO TABS: ORAL_TABLET | ORAL | 1 refills | Status: DC

## 2017-10-27 MED ORDER — BUDESONIDE-FORMOTEROL FUMARATE 160-4.5 MCG/ACT IN AERO: 2.0000 | INHALATION_SPRAY | Freq: Two times a day (BID) | RESPIRATORY_TRACT | 3 refills | Status: DC

## 2017-10-27 MED ORDER — GLUCOSE BLOOD VI STRP: ORAL_STRIP | 12 refills | Status: DC

## 2017-10-27 MED ORDER — METFORMIN HCL ER 500 MG PO TB24: 500.0000 mg | ORAL_TABLET | Freq: Every day | ORAL | 1 refills | Status: DC

## 2017-10-27 NOTE — Progress Notes (Signed)
Subjective:  Patient ID: Juan Merritt, male    DOB: 10-Apr-1951  Age: 66 y.o. MRN: [id number]  CC: Follow-up (pt here today for routine follow up of his chronic medical conditions and also c/o right ankle pain.)   HPI Juan Merritt presents forFollow-up of diabetes. REcent dx. Started on metformin. Patient has not begun to check blood sugar at home.  Lost 11 lbs by cutting out sodas and eating whole wheat bread he says. Patient denies symptoms such as polyuria, polydipsia, excessive hunger, nausea No significant hypoglycemic spells noted. Medications reviewed. Pt reports taking them regularly without complication/adverse reaction being reported today.   Follow-up of hypertension. Patient has no history of headache chest pain or shortness of breath or recent cough. Patient also denies symptoms of TIA such as numbness weakness lateralizing. Patient checks  blood pressure at home and has not had any elevated readings recently. Patient denies side effects from his medication. States taking it regularly. Patient also very concerned about increased swelling at the right ankle.  It is also painful.He cannot recall any injury.  History Juan Merritt has a past medical history of Arthritis, COPD (chronic obstructive pulmonary disease) ([place]), Gallstones, Gout, Hypertension, Obesity, and Pneumonia.   He has a past surgical history that includes Cholecystectomy and Tonsillectomy.   His family history includes Asthma in his mother; Congestive Heart Failure in his mother; Diabetes in his father; Esophageal cancer in his paternal uncle; Heart attack in his unknown relative; Heart disease in his mother; Leukemia in his brother; Lung disease in his unknown relative.He reports that he has been smoking cigarettes.  He has a 10.00 pack-year smoking history. His smokeless tobacco use includes chew. He reports that he drinks about 6.0 oz of alcohol per week. He reports that he does not use drugs.  Current  Outpatient Medications on File Prior to Visit  Medication Sig Dispense Refill  . albuterol (PROVENTIL) (2.5 MG/3ML) 0.083% nebulizer solution Take 3 mLs (2.5 mg total) by nebulization every 6 (six) hours as needed for wheezing or shortness of breath. 150 mL 1  . losartan (COZAAR) 100 MG tablet TAKE 1 TABLET BY MOUTH EVERY DAY 90 tablet 1   No current facility-administered medications on file prior to visit.     ROS Review of Systems  Constitutional: Negative for chills, diaphoresis, fever and unexpected weight change.  HENT: Negative for congestion, hearing loss, rhinorrhea and sore throat.   Eyes: Negative for visual disturbance.  Respiratory: Negative for cough and shortness of breath.   Cardiovascular: Negative for chest pain.  Gastrointestinal: Negative for abdominal pain, constipation and diarrhea.  Genitourinary: Negative for dysuria and flank pain.  Musculoskeletal: Negative for arthralgias and joint swelling.  Skin: Negative for rash.  Neurological: Negative for dizziness and headaches.  Psychiatric/Behavioral: Negative for dysphoric mood and sleep disturbance.    Objective:  BP 107/75   Pulse 78   Temp (!) 97.4 F (36.3 C) (Oral)   Ht '5\' 11"'  (1.803 m)   Wt 287 lb (130.2 kg)   BMI 40.03 kg/m    BP Readings from Last 3 Encounters:  10/27/17 107/75  07/31/17 109/67  07/18/17 111/68    Wt Readings from Last 3 Encounters:  10/27/17 287 lb (130.2 kg)  07/31/17 299 lb (135.6 kg)  07/18/17 299 lb (135.6 kg)     Physical Exam  Constitutional: He is oriented to person, place, and time. He appears well-developed and well-nourished. No distress.  HENT:  Head: Normocephalic and atraumatic.  Right Ear:  External ear normal.  Left Ear: External ear normal.  Nose: Nose normal.  Mouth/Throat: Oropharynx is clear and moist.  Eyes: Conjunctivae and EOM are normal. Pupils are equal, round, and reactive to light.  Neck: Normal range of motion. Neck supple. No thyromegaly  present.  Cardiovascular: Normal rate, regular rhythm and normal heart sounds.  No murmur heard. Pulmonary/Chest: Effort normal and breath sounds normal. No respiratory distress. He has no wheezes. He has no rales.  Abdominal: Soft. Bowel sounds are normal. He exhibits no distension. There is no tenderness.  Musculoskeletal: He exhibits edema and tenderness (Noted primarily at the right ankle with 2-3+ edema).  Lymphadenopathy:    He has no cervical adenopathy.  Neurological: He is alert and oriented to person, place, and time. He has normal reflexes.  Skin: Skin is warm and dry.  Psychiatric: He has a normal mood and affect. His behavior is normal. Judgment and thought content normal.        Assessment & Plan:   [place] was seen today for follow-up.  Diagnoses and all orders for this visit:  Essential hypertension, benign -     CMP14+EGFR; Future -     Lipid panel; Future  COPD GOLD II still smoking -     CMP14+EGFR; Future -     Lipid panel; Future -     Urine Culture -     Urinalysis -     Bayer DCA Hb A1c Waived; Future -     Uric acid; Future  Cigarette smoker -     CMP14+EGFR; Future -     Lipid panel; Future -     Urine Culture -     Urinalysis -     Bayer DCA Hb A1c Waived; Future -     Uric acid; Future  Type 2 diabetes mellitus without complication, without long-term current use of insulin (HCC) -     CMP14+EGFR; Future -     Lipid panel; Future -     Urine Culture -     Urinalysis -     Bayer DCA Hb A1c Waived; Future -     Uric acid; Future  Right ankle pain, unspecified chronicity -     DG Ankle Complete Right; Future  Prostatitis, unspecified prostatitis type -     Ambulatory referral to Urology  Closed nondisplaced avulsion fracture of right talus, initial encounter -     Ambulatory referral to Orthopedic Surgery  Other orders -     ciprofloxacin (CIPRO) 500 MG tablet; Take 1 tablet (500 mg total) 2 (two) times daily by mouth. -     Blood  Glucose Monitoring Suppl (BLOOD GLUCOSE MONITOR SYSTEM) w/Device KIT; 1 Device 2 (two) times daily by Does not apply route. -     allopurinol (ZYLOPRIM) 100 MG tablet; TAKE 1 TABLET (100 MG TOTAL) BY MOUTH DAILY. TO PREVENT GOUT -     amLODipine (NORVASC) 10 MG tablet; TAKE 1 TABLET (10 MG TOTAL) BY MOUTH DAILY. -     budesonide-formoterol (SYMBICORT) 160-4.5 MCG/ACT inhaler; Inhale 2 puffs 2 (two) times daily into the lungs. -     celecoxib (CELEBREX) 400 MG capsule; Take 1 capsule (400 mg total) daily by mouth. -     furosemide (LASIX) 40 MG tablet; TAKE 1 TABLET (40 MG TOTAL) BY MOUTH 2 (TWO) TIMES DAILY. ONE AT BREAKFAST AND LUNCH -     indomethacin (INDOCIN) 50 MG capsule; TAKE 1 CAPSULE BY MOUTH THREE TIMES A DAY AS NEEDED -  metFORMIN (GLUCOPHAGE-XR) 500 MG 24 hr tablet; Take 1 tablet (500 mg total) daily with breakfast by mouth. -     potassium chloride SA (KLOR-CON M20) 20 MEQ tablet; Take 1 tablet (20 mEq total) daily by mouth. -     spironolactone (ALDACTONE) 25 MG tablet; Take 1 tablet (25 mg total) daily by mouth.      I have discontinued Rendell Thivierge. Meiser's SYMBICORT. I have also changed his budesonide-formoterol, celecoxib, metFORMIN, potassium chloride SA, and spironolactone. Additionally, I am having him start on ciprofloxacin and Blood Glucose Monitor System. Lastly, I am having him maintain his albuterol, losartan, allopurinol, amLODipine, furosemide, and indomethacin.  Meds ordered this encounter  Medications  . ciprofloxacin (CIPRO) 500 MG tablet    Sig: Take 1 tablet (500 mg total) 2 (two) times daily by mouth.    Dispense:  28 tablet    Refill:  0  . Blood Glucose Monitoring Suppl (BLOOD GLUCOSE MONITOR SYSTEM) w/Device KIT    Sig: 1 Device 2 (two) times daily by Does not apply route.    Dispense:  1 each    Refill:  0  . allopurinol (ZYLOPRIM) 100 MG tablet    Sig: TAKE 1 TABLET (100 MG TOTAL) BY MOUTH DAILY. TO PREVENT GOUT    Dispense:  90 tablet    Refill:   1  . amLODipine (NORVASC) 10 MG tablet    Sig: TAKE 1 TABLET (10 MG TOTAL) BY MOUTH DAILY.    Dispense:  90 tablet    Refill:  1    Must be seen before next refill  . budesonide-formoterol (SYMBICORT) 160-4.5 MCG/ACT inhaler    Sig: Inhale 2 puffs 2 (two) times daily into the lungs.    Dispense:  3 Inhaler    Refill:  3    Defer to PCP for future refills please  . celecoxib (CELEBREX) 400 MG capsule    Sig: Take 1 capsule (400 mg total) daily by mouth.    Dispense:  90 capsule    Refill:  1  . furosemide (LASIX) 40 MG tablet    Sig: TAKE 1 TABLET (40 MG TOTAL) BY MOUTH 2 (TWO) TIMES DAILY. ONE AT BREAKFAST AND LUNCH    Dispense:  180 tablet    Refill:  1  . indomethacin (INDOCIN) 50 MG capsule    Sig: TAKE 1 CAPSULE BY MOUTH THREE TIMES A DAY AS NEEDED    Dispense:  90 capsule    Refill:  0  . metFORMIN (GLUCOPHAGE-XR) 500 MG 24 hr tablet    Sig: Take 1 tablet (500 mg total) daily with breakfast by mouth.    Dispense:  90 tablet    Refill:  1  . potassium chloride SA (KLOR-CON M20) 20 MEQ tablet    Sig: Take 1 tablet (20 mEq total) daily by mouth.    Dispense:  90 tablet    Refill:  1  . spironolactone (ALDACTONE) 25 MG tablet    Sig: Take 1 tablet (25 mg total) daily by mouth.    Dispense:  90 tablet    Refill:  1   Excellent weight loss noted with less than optimal diet.  Continue to encourage dietary compliance.  He needs to start checking his blood sugar at home he is going to obtain a monitor and we can teach him how to use it properly.  His wife says she knows how.  X-ray of the ankle shows talus fracture.  Cast boot for the right lower extremity  was dispensed.  Patient to wear it anytime he is weightbearing.  Stay off of it use ice as much as possible.  Urgent orthopedic referral placed.   Follow-up: No Follow-up on file.  [name], M.D.

## 2017-10-27 NOTE — Telephone Encounter (Signed)
Glucose monitor sent Pt needed test strips & lancets

## 2017-10-27 NOTE — Progress Notes (Signed)
Please have the patient come back in ASAP for a cast boot due to a chip in the wound the next foot to the ankle/leg.  Also referred to orthopedics as this bone is sometimes problematic to heal

## 2017-10-31 ENCOUNTER — Telehealth: Payer: Self-pay | Admitting: Family Medicine

## 2017-10-31 ENCOUNTER — Ambulatory Visit (INDEPENDENT_AMBULATORY_CARE_PROVIDER_SITE_OTHER): Payer: BLUE CROSS/BLUE SHIELD | Admitting: Orthopaedic Surgery

## 2017-10-31 DIAGNOSIS — M25571 Pain in right ankle and joints of right foot: Secondary | ICD-10-CM | POA: Diagnosis not present

## 2017-10-31 LAB — URINE CULTURE

## 2017-10-31 NOTE — Progress Notes (Signed)
Office Visit Note   Patient: Juan Merritt           Date of Birth: [birth year]           MRN: [id number] Visit Date: 10/31/2017              Requested by: [name], MD [place], [place] [address] PCP: [name], MD   Assessment & Plan: Visit Diagnoses:  1. Pain in right ankle and joints of right foot     Plan: X-rays show an avulsion fracture of the right talar neck.  This is amenable to conservative treatment.  Referral to physical therapy was made.  Patient declined ASO brace.  Questions encouraged and answered.  Follow-up as needed.  Follow-Up Instructions: Return if symptoms worsen or fail to improve.   Orders:  No orders of the defined types were placed in this encounter.  No orders of the defined types were placed in this encounter.     Procedures: No procedures performed   Clinical Data: No additional findings.   Subjective: No chief complaint on file.   Patient is a 66 year old gentleman who comes in with right ankle pain for about a month.  He is a poor historian.  He states that he is not exactly sure if he injured his right ankle or not.  His wife states that he may have slid out of a recliner around a month ago when his ankle started hurting.  He comes in today for further evaluation and treatment of his right talus avulsion fracture.  He is currently ambulating in a Cam walker.  Denies any numbness or tingling.    Review of Systems  Constitutional: Negative.   All other systems reviewed and are negative.    Objective: Vital Signs: There were no vitals taken for this visit.  Physical Exam  Constitutional: He is oriented to person, place, and time. He appears well-developed and well-nourished.  HENT:  Head: Normocephalic and atraumatic.  Eyes: Pupils are equal, round, and reactive to light.  Neck: Neck supple.  Pulmonary/Chest: Effort normal.  Abdominal: Soft.  Musculoskeletal: Normal range of motion.  Neurological: He  is alert and oriented to person, place, and time.  Skin: Skin is warm.  Psychiatric: He has a normal mood and affect. His behavior is normal. Judgment and thought content normal.  Nursing note and vitals reviewed.   Ortho Exam Right ankle exam shows no significant swelling.  He has mild tenderness over the talar neck. Specialty Comments:  No specialty comments available.  Imaging: No results found.   PMFS History: Patient Active Problem List   Diagnosis Date Noted  . Gout, chronic 07/01/2016  . Incomplete rotator cuff tear 09/07/2015  . Essential hypertension, benign 09/07/2015  . COPD GOLD II still smoking 03/29/2015  . Cigarette smoker 03/24/2015   Past Medical History:  Diagnosis Date  . Arthritis   . COPD (chronic obstructive pulmonary disease) (HCC)    not on home o2  . Gallstones   . Gout   . Hypertension   . Obesity   . Pneumonia     Family History  Problem Relation Age of Onset  . Asthma Mother   . Diabetes Father   . Esophageal cancer Paternal Uncle   . Heart disease Mother   . Congestive Heart Failure Mother   . Lung disease Unknown        pat cousin  . Colon cancer Neg Hx   . Leukemia Brother   .  Heart attack Unknown        pat cousin    Past Surgical History:  Procedure Laterality Date  . CHOLECYSTECTOMY    . TONSILLECTOMY     Social History   Occupational History  . Occupation: retired Nurse, mental health  . Smoking status: Light Tobacco Smoker    Packs/day: 0.25    Years: 40.00    Pack years: 10.00    Types: Cigarettes  . Smokeless tobacco: Current User    Types: Chew  . Tobacco comment: tobacco info given 03/17/16  Substance and Sexual Activity  . Alcohol use: Yes    Alcohol/week: 6.0 oz    Types: 10 Standard drinks or equivalent per week  . Drug use: No  . Sexual activity: Not on file

## 2017-10-31 NOTE — Telephone Encounter (Signed)
Please let him know that a staph germ found in his urine should respond to the cipro I prescribed the day he came in. Thanks, WS

## 2017-11-01 ENCOUNTER — Other Ambulatory Visit: Payer: BLUE CROSS/BLUE SHIELD

## 2017-11-01 DIAGNOSIS — F17210 Nicotine dependence, cigarettes, uncomplicated: Secondary | ICD-10-CM

## 2017-11-01 DIAGNOSIS — E119 Type 2 diabetes mellitus without complications: Secondary | ICD-10-CM

## 2017-11-01 DIAGNOSIS — I10 Essential (primary) hypertension: Secondary | ICD-10-CM

## 2017-11-01 DIAGNOSIS — J449 Chronic obstructive pulmonary disease, unspecified: Secondary | ICD-10-CM

## 2017-11-01 LAB — BAYER DCA HB A1C WAIVED: HB A1C (BAYER DCA - WAIVED): 5.9 % (ref <7.0)

## 2017-11-02 LAB — CMP14+EGFR
A/G RATIO: 1.6 (ref 1.2–2.2)
ALK PHOS: 52 IU/L (ref 39–117)
ALT: 34 IU/L (ref 0–44)
AST: 21 IU/L (ref 0–40)
Albumin: 4.2 g/dL (ref 3.6–4.8)
BUN/Creatinine Ratio: 17 (ref 10–24)
BUN: 17 mg/dL (ref 8–27)
Bilirubin Total: 0.4 mg/dL (ref 0.0–1.2)
CALCIUM: 8.8 mg/dL (ref 8.6–10.2)
CO2: 24 mmol/L (ref 20–29)
Chloride: 99 mmol/L (ref 96–106)
Creatinine, Ser: 1.01 mg/dL (ref 0.76–1.27)
GFR calc Af Amer: 90 mL/min/{1.73_m2} (ref >59)
GFR calc non Af Amer: 78 mL/min/{1.73_m2} (ref >59)
GLOBULIN, TOTAL: 2.7 g/dL (ref 1.5–4.5)
Glucose: 97 mg/dL (ref 65–99)
POTASSIUM: 4.5 mmol/L (ref 3.5–5.2)
SODIUM: 139 mmol/L (ref 134–144)
Total Protein: 6.9 g/dL (ref 6.0–8.5)

## 2017-11-02 LAB — LIPID PANEL
CHOL/HDL RATIO: 4 ratio (ref 0.0–5.0)
CHOLESTEROL TOTAL: 167 mg/dL (ref 100–199)
HDL: 42 mg/dL (ref >39)
LDL CALC: 100 mg/dL — AB (ref 0–99)
TRIGLYCERIDES: 126 mg/dL (ref 0–149)
VLDL Cholesterol Cal: 25 mg/dL (ref 5–40)

## 2017-11-02 LAB — URIC ACID: Uric Acid: 6.9 mg/dL (ref 3.7–8.6)

## 2017-11-06 ENCOUNTER — Other Ambulatory Visit: Payer: Self-pay | Admitting: *Deleted

## 2017-11-06 MED ORDER — INDOMETHACIN 50 MG PO CAPS: ORAL_CAPSULE | ORAL | 0 refills | Status: DC

## 2017-11-07 NOTE — Telephone Encounter (Signed)
Patient aware of results in result note

## 2017-11-08 ENCOUNTER — Other Ambulatory Visit: Payer: Self-pay | Admitting: Family Medicine

## 2017-11-08 ENCOUNTER — Telehealth: Payer: Self-pay | Admitting: Family Medicine

## 2017-11-08 MED ORDER — DOXYCYCLINE HYCLATE 100 MG PO CAPS: 100.0000 mg | ORAL_CAPSULE | Freq: Two times a day (BID) | ORAL | 0 refills | Status: DC

## 2017-11-08 NOTE — Telephone Encounter (Signed)
Patients wife aware

## 2017-11-08 NOTE — Telephone Encounter (Signed)
What symptoms do you have? Urgency, and frequency when urinating  How long have you been sick? A week  Have you been seen for this problem? yes  If your provider decides to give you a prescription, which pharmacy would you like for it to be sent to? [facility]   Patient informed that this information will be sent to the clinical staff for review and that they should receive a follow up call.

## 2017-11-08 NOTE — Telephone Encounter (Signed)
I sent in the requested prescription 

## 2017-12-02 ENCOUNTER — Other Ambulatory Visit: Payer: Self-pay | Admitting: Family Medicine

## 2018-01-15 ENCOUNTER — Ambulatory Visit: Payer: BLUE CROSS/BLUE SHIELD | Admitting: Pediatrics

## 2018-01-15 ENCOUNTER — Encounter: Payer: Self-pay | Admitting: Pediatrics

## 2018-01-15 VITALS — BP 125/76 | HR 83 | Temp 97.3°F | Ht 71.0 in | Wt 290.8 lb

## 2018-01-15 DIAGNOSIS — M25511 Pain in right shoulder: Secondary | ICD-10-CM

## 2018-01-15 DIAGNOSIS — M25512 Pain in left shoulder: Secondary | ICD-10-CM | POA: Diagnosis not present

## 2018-01-15 MED ORDER — MELOXICAM 15 MG PO TABS: 15.0000 mg | ORAL_TABLET | Freq: Every day | ORAL | 0 refills | Status: DC

## 2018-01-15 NOTE — Progress Notes (Signed)
  Subjective:   Patient ID: Juan Merritt, male    DOB: [birth year], 67 y.o.   MRN: [id number] CC: Shoulder Pain (bilateral should pain down to elbow pain for lthe last week, hurts to climb in & out of truck)  HPI: Juan Merritt is a 67 y.o. 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 Allergies and medications reviewed and updated. Social History   Tobacco Use  Smoking Status Former Smoker  . Packs/day: 0.25  . Years: 40.00  . Pack years: 10.00  . Types: Cigarettes  . Last attempt to quit: 01/15/2017  . Years since quitting: 1.0  Smokeless Tobacco Current User  . Types: Chew  Tobacco Comment   tobacco info given 03/17/16   ROS: Per HPI   Objective:    BP 125/76   Pulse 83   Temp (!) 97.3 F (36.3 C) (Oral)   Ht 5\' 11"  (1.803 m)   Wt 290 lb 12.8 oz (131.9 kg)   BMI 40.56 kg/m   Wt Readings from Last 3 Encounters:  01/15/18 290 lb 12.8 oz (131.9 kg)  10/27/17 287 lb (130.2 kg)  07/31/17 299 lb (135.6 kg)    Gen: NAD, alert, cooperative with exam, NCAT EYES: EOMI, no conjunctival injection, or no icterus CV:  distal pulses 2+ b/l Resp:  normal WOB Ext: No edema, warm Neuro: Alert and oriented, strength equal b/l UE  MSK: normal muscle bulk Shoulders symmetric to inspection No redness or swelling Minimal ttp b/l anterior shoulder over biceps insertion, no pain at  [facility] joint Normal ROM with shoulder extension, some pain in shoulder, pain with shoulder abduction, limited to 90 degrees b/l Pain with internal rotation b/l No pain with biceps flexion  Assessment & Plan:  Juan Merritt was seen today for shoulder pain and upper arm pain  Diagnoses and all orders for this visit:  Acute pain of both shoulders Started about a week ago Has to continue to use biceps at work to lift, change gears, some soreness with use Pt wants to try alternate arthritis medication, stop celecosib, start below Any abd pain let us know Discussed gentle ROM of shoulder, avoiding movements and activities that make pain worse Declined referral to PT today -     meloxicam (MOBIC) 15 MG tablet; Take 1 tablet (15 mg total) by mouth daily.   Follow up plan: Return if symptoms worsen or fail to improve. Assunta Found, MD [place]

## 2018-01-23 ENCOUNTER — Telehealth: Payer: Self-pay | Admitting: Family Medicine

## 2018-01-23 DIAGNOSIS — M25519 Pain in unspecified shoulder: Secondary | ICD-10-CM

## 2018-01-23 NOTE — Telephone Encounter (Signed)
Seen for pain in shoulders.  Meloxicam not working.   Please advise.

## 2018-01-23 NOTE — Telephone Encounter (Signed)
Spouse is calling to let Dr Evette Doffing know that meloxicam (MOBIC) 15 MG tablet medication is not working. He was seen 01/15/18. Please call back

## 2018-01-24 NOTE — Telephone Encounter (Signed)
I can refer him to physical therapy as we discussed, if he wants to see ortho now OK to put in that referral as well

## 2018-01-24 NOTE — Telephone Encounter (Signed)
Aware of provider's suggestions.  He will discuss with his wife and call back.

## 2018-01-25 NOTE — Telephone Encounter (Signed)
He does want Ortho referral but NOT physical therapy

## 2018-01-26 ENCOUNTER — Ambulatory Visit: Payer: BLUE CROSS/BLUE SHIELD | Admitting: Pediatrics

## 2018-01-26 ENCOUNTER — Telehealth: Payer: Self-pay | Admitting: Family Medicine

## 2018-01-26 DIAGNOSIS — G8929 Other chronic pain: Secondary | ICD-10-CM

## 2018-01-26 DIAGNOSIS — M25512 Pain in left shoulder: Principal | ICD-10-CM

## 2018-01-26 DIAGNOSIS — M25511 Pain in right shoulder: Principal | ICD-10-CM

## 2018-01-26 MED ORDER — PREDNISONE 20 MG PO TABS: ORAL_TABLET | ORAL | 0 refills | Status: DC

## 2018-01-26 NOTE — Telephone Encounter (Signed)
Please help pt per his wife. Needs something for shoulder pain. Anything or any amount will help until ortho appt

## 2018-01-26 NOTE — Telephone Encounter (Signed)
Wife aware referral to Ortho was made

## 2018-01-26 NOTE — Telephone Encounter (Signed)
Pt aware.

## 2018-01-26 NOTE — Telephone Encounter (Signed)
I sent in 3 days of prednisone, a steroid that can help with inflammation which should help with pain. Because it is a steroid it may raise his blood sugars some. Take it in the morning as can keep people awake if you take it at night.   He can go back to taking aleve or ibuprofen instead of the meloxicam if he would like.  If pain worsening he should be seen. Hopefully will be able to get him in to see ortho soon though. Continue ice, heat, rest and avoiding activities that make the pain worse.

## 2018-01-30 ENCOUNTER — Encounter: Payer: Self-pay | Admitting: Family Medicine

## 2018-01-30 ENCOUNTER — Ambulatory Visit: Payer: BLUE CROSS/BLUE SHIELD | Admitting: Family Medicine

## 2018-01-30 VITALS — BP 123/77 | Temp 97.0°F | Ht 71.0 in | Wt 295.0 lb

## 2018-01-30 DIAGNOSIS — G8929 Other chronic pain: Secondary | ICD-10-CM | POA: Diagnosis not present

## 2018-01-30 DIAGNOSIS — E119 Type 2 diabetes mellitus without complications: Secondary | ICD-10-CM

## 2018-01-30 DIAGNOSIS — M25512 Pain in left shoulder: Secondary | ICD-10-CM | POA: Diagnosis not present

## 2018-01-30 DIAGNOSIS — M25511 Pain in right shoulder: Secondary | ICD-10-CM | POA: Diagnosis not present

## 2018-01-30 DIAGNOSIS — I10 Essential (primary) hypertension: Secondary | ICD-10-CM

## 2018-01-30 DIAGNOSIS — Z13220 Encounter for screening for lipoid disorders: Secondary | ICD-10-CM

## 2018-01-30 LAB — BAYER DCA HB A1C WAIVED: HB A1C (BAYER DCA - WAIVED): 6 % (ref <7.0)

## 2018-01-30 MED ORDER — PREDNISONE 20 MG PO TABS: ORAL_TABLET | ORAL | 0 refills | Status: DC

## 2018-01-30 NOTE — Progress Notes (Signed)
Subjective:  Patient ID: Juan Merritt, male    DOB: [birth year]  Age: 67 y.o. MRN: [id number]  CC: Shoulder Pain (pt here today c/o shoulder pain but the prednisone has helped)   HPI Juan Merritt presents for both shoulders have resolved with the prednisone.  He was much improved after the first dose.  He wants to get back to work now.  He has an orthopedic appointment scheduled for tomorrow and wants to know if he needs to go or not.  There was no known injury.  He does work a lot and uses arms and shoulders a lot as part of the physical nature of his work.  The pain was rather sudden onset about 8 days ago.  He tried various home remedies with no success and was called in some prednisone on February 8.  After the first dose was much better.   Follow-up of hypertension. Patient has no history of headache chest pain or shortness of breath or recent cough. Patient also denies symptoms of TIA such as numbness weakness lateralizing. Patient checks  blood pressure at home and has not had any elevated readings recently. Patient denies side effects from his medication. States taking it regularly.   Follow-up of diabetes. Patient does not check blood sugar at home Patient denies symptoms such as polyuria, polydipsia, excessive hunger, nausea No significant hypoglycemic spells noted. Medications as noted below. Taking them regularly without complication/adverse reaction being reported today.  COPD stable currently able to perform his usual routine as  long as he uses his Symbicort regularly. Depression screen [facility] 2/9 01/30/2018 10/27/2017 07/31/2017  Decreased Interest 0 0 0  Down, Depressed, Hopeless 0 0 0  PHQ - 2 Score 0 0 0    History Juan Merritt has a past medical history of Arthritis, COPD (chronic obstructive pulmonary disease) (HCC), Gallstones, Gout, Hypertension, Obesity, and Pneumonia.   He has a past surgical history that includes Cholecystectomy and Tonsillectomy.   His family  history includes Asthma in his mother; Congestive Heart Failure in his mother; Diabetes in his father; Esophageal cancer in his paternal uncle; Heart attack in his unknown relative; Heart disease in his mother; Leukemia in his brother; Lung disease in his unknown relative.He reports that he quit smoking about 12 months ago. His smoking use included cigarettes. He has a 10.00 pack-year smoking history. His smokeless tobacco use includes chew. He reports that he drinks about 6.0 oz of alcohol per week. He reports that he does not use drugs.    ROS Review of Systems  Constitutional: Negative for chills, diaphoresis, fever and unexpected weight change.  HENT: Negative for congestion, hearing loss, rhinorrhea and sore throat.   Eyes: Negative for visual disturbance.  Respiratory: Negative for cough and shortness of breath.   Cardiovascular: Negative for chest pain.  Gastrointestinal: Negative for abdominal pain, constipation and diarrhea.  Genitourinary: Negative for dysuria and flank pain.  Musculoskeletal: Positive for arthralgias and myalgias. Negative for joint swelling.  Skin: Negative for rash.  Neurological: Negative for dizziness and headaches.  Psychiatric/Behavioral: Negative for dysphoric mood and sleep disturbance.    Objective:  BP 123/77   Temp (!) 97 F (36.1 C) (Oral)   Ht _0  (1.803 m)   Wt 295 lb (133.8 kg)   BMI 41.14 kg/m   BP Readings from Last 3 Encounters:  01/30/18 123/77  01/15/18 125/76  10/27/17 107/75    Wt Readings from Last 3 Encounters:  01/30/18 295 lb (133.8 kg)  01/15/18  290 lb 12.8 oz (131.9 kg)  10/27/17 287 lb (130.2 kg)     Physical Exam  Constitutional: He is oriented to person, place, and time. He appears well-developed and well-nourished. No distress.  HENT:  Head: Normocephalic and atraumatic.  Right Ear: External ear normal.  Left Ear: External ear normal.  Nose: Nose normal.  Mouth/Throat: Oropharynx is clear and moist.  Eyes:  Conjunctivae and EOM are normal. Pupils are equal, round, and reactive to light.  Neck: Normal range of motion. Neck supple. No thyromegaly present.  Cardiovascular: Normal rate, regular rhythm and normal heart sounds.  No murmur heard. Pulmonary/Chest: Effort normal and breath sounds normal. No respiratory distress. He has no wheezes. He has no rales.  Abdominal: Soft. Bowel sounds are normal. He exhibits no distension. There is no tenderness.  Lymphadenopathy:    He has no cervical adenopathy.  Neurological: He is alert and oriented to person, place, and time. He has normal reflexes.  Skin: Skin is warm and dry.  Psychiatric: He has a normal mood and affect. His behavior is normal. Judgment and thought content normal.      Assessment & Plan:   Juan Merritt was seen today for shoulder pain.  Diagnoses and all orders for this visit:  Essential hypertension, benign -     CBC with Differential/Platelet -     CMP14+EGFR  Type 2 diabetes mellitus without complication, without long-term current use of insulin (HCC) -     Bayer DCA Hb A1c Waived  Screening for cholesterol level -     Lipid panel  Chronic pain of both shoulders -     predniSONE (DELTASONE) 20 MG tablet; 2 po at same time daily for 3 days       I am having Juan Merritt maintain his albuterol, losartan, Blood Glucose Monitor System, allopurinol, amLODipine, budesonide-formoterol, furosemide, metFORMIN, potassium chloride SA, spironolactone, glucose blood, onetouch ultrasoft, meloxicam, and predniSONE.  Allergies as of 01/30/2018   No Known Allergies     Medication List        Accurate as of 01/30/18 10:28 AM. Always use your most recent med list.          albuterol (2.5 MG/3ML) 0.083% nebulizer solution Commonly known as:  PROVENTIL Take 3 mLs (2.5 mg total) by nebulization every 6 (six) hours as needed for wheezing or shortness of breath.   allopurinol 100 MG tablet Commonly known as:  ZYLOPRIM TAKE 1  TABLET (100 MG TOTAL) BY MOUTH DAILY. TO PREVENT GOUT   amLODipine 10 MG tablet Commonly known as:  NORVASC TAKE 1 TABLET (10 MG TOTAL) BY MOUTH DAILY.   Blood Glucose Monitor System w/Device Kit 1 Device 2 (two) times daily by Does not apply route.   budesonide-formoterol 160-4.5 MCG/ACT inhaler Commonly known as:  SYMBICORT Inhale 2 puffs 2 (two) times daily into the lungs.   furosemide 40 MG tablet Commonly known as:  LASIX TAKE 1 TABLET (40 MG TOTAL) BY MOUTH 2 (TWO) TIMES DAILY. ONE AT BREAKFAST AND LUNCH   glucose blood test strip Commonly known as:  ONE TOUCH ULTRA TEST Use to check blood sugars twice daily   losartan 100 MG tablet Commonly known as:  COZAAR TAKE 1 TABLET BY MOUTH EVERY DAY   meloxicam 15 MG tablet Commonly known as:  MOBIC Take 1 tablet (15 mg total) by mouth daily.   metFORMIN 500 MG 24 hr tablet Commonly known as:  GLUCOPHAGE-XR Take 1 tablet (500 mg total) daily with breakfast by  mouth.   onetouch ultrasoft lancets Use to check blood sugars twice daily   potassium chloride SA 20 MEQ tablet Commonly known as:  KLOR-CON M20 Take 1 tablet (20 mEq total) daily by mouth.   predniSONE 20 MG tablet Commonly known as:  DELTASONE 2 po at same time daily for 3 days   spironolactone 25 MG tablet Commonly known as:  ALDACTONE Take 1 tablet (25 mg total) daily by mouth.        Follow-up: Return in about 6 months (around 07/30/2018).  [name], M.D.

## 2018-01-31 ENCOUNTER — Ambulatory Visit (INDEPENDENT_AMBULATORY_CARE_PROVIDER_SITE_OTHER): Payer: Self-pay

## 2018-01-31 ENCOUNTER — Ambulatory Visit (INDEPENDENT_AMBULATORY_CARE_PROVIDER_SITE_OTHER): Payer: BLUE CROSS/BLUE SHIELD | Admitting: Orthopaedic Surgery

## 2018-01-31 DIAGNOSIS — M25512 Pain in left shoulder: Secondary | ICD-10-CM

## 2018-01-31 DIAGNOSIS — M25511 Pain in right shoulder: Secondary | ICD-10-CM | POA: Diagnosis not present

## 2018-01-31 LAB — CBC WITH DIFFERENTIAL/PLATELET
Basophils Absolute: 0 10*3/uL (ref 0.0–0.2)
Basos: 0 %
EOS (ABSOLUTE): 0 10*3/uL (ref 0.0–0.4)
EOS: 0 %
HEMOGLOBIN: 13.9 g/dL (ref 13.0–17.7)
Hematocrit: 41.6 % (ref 37.5–51.0)
IMMATURE GRANS (ABS): 0 10*3/uL (ref 0.0–0.1)
IMMATURE GRANULOCYTES: 0 %
LYMPHS: 25 %
Lymphocytes Absolute: 2.4 10*3/uL (ref 0.7–3.1)
MCH: 31.4 pg (ref 26.6–33.0)
MCHC: 33.4 g/dL (ref 31.5–35.7)
MCV: 94 fL (ref 79–97)
MONOCYTES: 7 %
Monocytes Absolute: 0.6 10*3/uL (ref 0.1–0.9)
NEUTROS PCT: 68 %
Neutrophils Absolute: 6.5 10*3/uL (ref 1.4–7.0)
Platelets: 280 10*3/uL (ref 150–379)
RBC: 4.43 x10E6/uL (ref 4.14–5.80)
RDW: 14.3 % (ref 12.3–15.4)
WBC: 9.5 10*3/uL (ref 3.4–10.8)

## 2018-01-31 LAB — CMP14+EGFR
ALBUMIN: 4.3 g/dL (ref 3.6–4.8)
ALK PHOS: 48 IU/L (ref 39–117)
ALT: 26 IU/L (ref 0–44)
AST: 15 IU/L (ref 0–40)
Albumin/Globulin Ratio: 1.5 (ref 1.2–2.2)
BUN / CREAT RATIO: 23 (ref 10–24)
BUN: 23 mg/dL (ref 8–27)
Bilirubin Total: 0.3 mg/dL (ref 0.0–1.2)
CALCIUM: 8.9 mg/dL (ref 8.6–10.2)
CO2: 22 mmol/L (ref 20–29)
CREATININE: 1 mg/dL (ref 0.76–1.27)
Chloride: 101 mmol/L (ref 96–106)
GFR calc Af Amer: 92 mL/min/{1.73_m2} (ref >59)
GFR, EST NON AFRICAN AMERICAN: 79 mL/min/{1.73_m2} (ref >59)
Globulin, Total: 2.9 g/dL (ref 1.5–4.5)
Glucose: 111 mg/dL — ABNORMAL HIGH (ref 65–99)
Potassium: 4.4 mmol/L (ref 3.5–5.2)
Sodium: 142 mmol/L (ref 134–144)
TOTAL PROTEIN: 7.2 g/dL (ref 6.0–8.5)

## 2018-01-31 LAB — LIPID PANEL
CHOLESTEROL TOTAL: 195 mg/dL (ref 100–199)
Chol/HDL Ratio: 3.1 ratio (ref 0.0–5.0)
HDL: 62 mg/dL (ref >39)
LDL CALC: 116 mg/dL — AB (ref 0–99)
Triglycerides: 84 mg/dL (ref 0–149)
VLDL CHOLESTEROL CAL: 17 mg/dL (ref 5–40)

## 2018-01-31 NOTE — Progress Notes (Signed)
Office Visit Note   Patient: Juan Merritt           Date of Birth: December 24, 1950           MRN: [id number] Visit Date: 01/31/2018              Requested by: [address], [place], [place] [address] PCP: [name], MD   Assessment & Plan: Visit Diagnoses:  1. Acute pain of both shoulders     Plan: Complete six-day course of prednisone per his family physician. If no improvement may either take Celebrex or meloxicam and follow-up next week to consider subacromial cortisone injection of one or both shoulders. I think he has subacromial tendinitis or bursitis.  Follow-Up Instructions: No Follow-up on file.   Orders:  Orders Placed This Encounter  Procedures  . XR Shoulder Left  . XR Shoulder Right   No orders of the defined types were placed in this encounter.     Procedures: No procedures performed   Clinical Data: No additional findings.   Subjective: No chief complaint on file. [name]. Stent insidious onset of bilateral shoulder pain without injury or trauma approximately 2 weeks ago. He is a semiretired truck driver but does not remember any particular traumatic event. He reached a point where he was having difficulty sleeping and raising his arms over his head. He denied any neck pain. He's not had any numbness or tingling. Pain. Originates in the area of the shoulder radiates along the arm to the elbow. He did see his primary care physician who placed him on prednisone 48 hours ago. He is significantly improved. There is a history of rotator cuff tear of his right shoulder treated conservatively with therapy about 2 years ago without any "problem" Presently his pain is much improved. He is able to raise his arms over his head but with some discomfort.  HPI  Review of Systems   Objective: Vital Signs: There were no vitals taken for this visit.  Physical Exam  Ortho Exam awake alert and oriented 3. Comfortable sitting. He is able  to raise both arms over his head but with a secure his motion. Little bit of crepitation with motion on the right but not the left. Good grip and good release. Has good strength in both upper extremities. Biceps intact bilaterally.  He has some ecchymotic areas about the right arm. He is not taking a  blood thinner. Minimally positive impingement. Negative empty can testing. [place] any pain with range of motion of the cervical spine. Cannot reproduce shoulder pain with motion back. Does have some limitation of neck extension but without pain  Specialty Comments:  No specialty comments available.  Imaging: No results found.   PMFS History: Patient Active Problem List   Diagnosis Date Noted  . Gout, chronic 07/01/2016  . Incomplete rotator cuff tear 09/07/2015  . Essential hypertension, benign 09/07/2015  . COPD GOLD II still smoking 03/29/2015  . Cigarette smoker 03/24/2015   Past Medical History:  Diagnosis Date  . Arthritis   . COPD (chronic obstructive pulmonary disease) (HCC)    not on home o2  . Gallstones   . Gout   . Hypertension   . Obesity   . Pneumonia     Family History  Problem Relation Age of Onset  . Asthma Mother   . Heart disease Mother   . Congestive Heart Failure Mother   . Diabetes Father   . Esophageal cancer  Paternal Uncle   . Lung disease Unknown        pat cousin  . Leukemia Brother   . Heart attack Unknown        pat cousin  . Colon cancer Neg Hx     Past Surgical History:  Procedure Laterality Date  . CHOLECYSTECTOMY    . TONSILLECTOMY     Social History   Occupational History  . Occupation: retired Nurse, mental health  . Smoking status: Former Smoker    Packs/day: 0.25    Years: 40.00    Pack years: 10.00    Types: Cigarettes    Last attempt to quit: 01/15/2017    Years since quitting: 1.0  . Smokeless tobacco: Current User    Types: Chew  . Tobacco comment: tobacco info given 03/17/16  Substance and Sexual Activity  . Alcohol  use: Yes    Alcohol/week: 6.0 oz    Types: 10 Standard drinks or equivalent per week  . Drug use: No  . Sexual activity: Not on file     Garald Balding, MD   Note - This record has been created using [employer].  Chart creation errors have been sought, but may not always  have been located. Such creation errors do not reflect on  the standard of medical care.

## 2018-02-02 ENCOUNTER — Ambulatory Visit: Payer: BLUE CROSS/BLUE SHIELD | Admitting: Family Medicine

## 2018-02-07 ENCOUNTER — Ambulatory Visit (INDEPENDENT_AMBULATORY_CARE_PROVIDER_SITE_OTHER): Payer: BLUE CROSS/BLUE SHIELD | Admitting: Orthopedic Surgery

## 2018-02-07 ENCOUNTER — Other Ambulatory Visit: Payer: Self-pay | Admitting: *Deleted

## 2018-02-07 ENCOUNTER — Encounter (INDEPENDENT_AMBULATORY_CARE_PROVIDER_SITE_OTHER): Payer: Self-pay | Admitting: Orthopedic Surgery

## 2018-02-07 VITALS — BP 123/85 | HR 78 | Resp 18 | Ht 72.0 in | Wt 295.0 lb

## 2018-02-07 DIAGNOSIS — M7541 Impingement syndrome of right shoulder: Secondary | ICD-10-CM | POA: Diagnosis not present

## 2018-02-07 DIAGNOSIS — M25511 Pain in right shoulder: Secondary | ICD-10-CM

## 2018-02-07 DIAGNOSIS — M25512 Pain in left shoulder: Principal | ICD-10-CM

## 2018-02-07 DIAGNOSIS — M7542 Impingement syndrome of left shoulder: Secondary | ICD-10-CM

## 2018-02-07 MED ORDER — BUPIVACAINE HCL 0.5 % IJ SOLN
2.0000 mL | INTRAMUSCULAR | Status: AC | PRN
  Administered 2018-02-07: 2 mL via INTRA_ARTICULAR

## 2018-02-07 MED ORDER — LIDOCAINE HCL 2 % IJ SOLN
2.0000 mL | INTRAMUSCULAR | Status: AC | PRN
  Administered 2018-02-07: 2 mL

## 2018-02-07 MED ORDER — METHYLPREDNISOLONE ACETATE 40 MG/ML IJ SUSP
80.0000 mg | INTRAMUSCULAR | Status: AC | PRN
  Administered 2018-02-07: 80 mg

## 2018-02-07 MED ORDER — MELOXICAM 15 MG PO TABS: 15.0000 mg | ORAL_TABLET | Freq: Every day | ORAL | 0 refills | Status: DC

## 2018-02-07 NOTE — Progress Notes (Signed)
Office Visit Note   Patient: Juan Merritt           Date of Birth: 1951-12-10           MRN: [id number] Visit Date: 02/07/2018              Requested by: [name], MD [place], [place] [address] PCP: [name], MD   Assessment & Plan: Visit Diagnoses:  1. Impingement syndrome of left shoulder   2. Impingement syndrome of right shoulder     Plan:  #1: Corticosteroid injection to the subacromial space of the left shoulder. #2: If his right shoulder which is less symptomatic worsens we can certainly try to put an injection into the right shoulder in the near future.  Follow-Up Instructions: No Follow-up on file.   Orders:  No orders of the defined types were placed in this encounter.  No orders of the defined types were placed in this encounter.     Procedures: Large Joint Inj: L subacromial bursa on 02/07/2018 3:33 PM Indications: pain and diagnostic evaluation Details: 25 G 1.5 in needle, anterolateral approach  Arthrogram: No  Medications: 2 mL lidocaine 2 %; 2 mL bupivacaine 0.5 %; 80 mg methylPREDNISolone acetate 40 MG/ML Consent was given by the patient. Immediately prior to procedure a time out was called to verify the correct patient, procedure, equipment, support staff and site/side marked as required. Patient was prepped and draped in the usual sterile fashion.       Clinical Data: No additional findings.   Subjective: Chief Complaint  Patient presents with  . Right Shoulder - Pain    Mr. Brogden is a 67 y o here for a F/U bilateral shoulder pain.  The prednisone from PCP but both shoulder hurt whenever he reached for something. Not idabetic  . Left Shoulder - Pain    Left worse than right with pain.    HPI  [name] is a very pleasant 67 year old white male who is seen today for bilateral shoulder pain.  Today his left shoulder is worse than his right.  He has been on a prednisone Dosepak by his PCP for both of his shoulders  and it actually had benefit.  However now that the goat dose pack is complete he is now starting to have pain again his left shoulder is much more symptomatic than his right.  He does have a history of rotator cuff tear of the right shoulder has been treated conservatively about 2 years ago.  Have pain with range of motion.  Review of Systems  Constitutional: Negative for fatigue.  HENT: Negative for hearing loss.   Respiratory: Positive for shortness of breath. Negative for apnea and chest tightness.   Cardiovascular: Negative for chest pain, palpitations and leg swelling.  Gastrointestinal: Negative for blood in stool, constipation and diarrhea.  Genitourinary: Negative for difficulty urinating.  Musculoskeletal: Negative for arthralgias, back pain, joint swelling, myalgias, neck pain and neck stiffness.  Neurological: Negative for weakness, numbness and headaches.  Hematological: Does not bruise/bleed easily.  Psychiatric/Behavioral: Negative for sleep disturbance. The patient is not nervous/anxious.      Objective: Vital Signs: BP 123/85   Pulse 78   Resp 18   Ht 6' (1.829 m)   Wt 295 lb (133.8 kg)   BMI 40.01 kg/m   Physical Exam  Constitutional: He is oriented to person, place, and time. He appears well-developed and well-nourished.  HENT:  Head: Normocephalic.  Eyes: EOM are normal.  Pupils are equal, round, and reactive to light.  Pulmonary/Chest: Effort normal.  Neurological: He is alert and oriented to person, place, and time.  Skin: Skin is warm and dry.  Psychiatric: He has a normal mood and affect. His behavior is normal. Judgment and thought content normal.  Vitals reviewed.   Ortho Exam  Today range of motion of both shoulders forward flexion 150 degrees abduction 140 degrees.  External rotation to about 45 degrees internal rotation to about 20 degrees.  He can test worse on the left than on the right.  Strength overall is  good bilaterally.  Specialty Comments:    No specialty comments available.  Imaging: No results found.   PMFS History: Current Outpatient Medications  Medication Sig Dispense Refill  . albuterol (PROVENTIL) (2.5 MG/3ML) 0.083% nebulizer solution Take 3 mLs (2.5 mg total) by nebulization every 6 (six) hours as needed for wheezing or shortness of breath. 150 mL 1  . allopurinol (ZYLOPRIM) 100 MG tablet TAKE 1 TABLET (100 MG TOTAL) BY MOUTH DAILY. TO PREVENT GOUT 90 tablet 1  . amLODipine (NORVASC) 10 MG tablet TAKE 1 TABLET (10 MG TOTAL) BY MOUTH DAILY. 90 tablet 1  . budesonide-formoterol (SYMBICORT) 160-4.5 MCG/ACT inhaler Inhale 2 puffs 2 (two) times daily into the lungs. 3 Inhaler 3  . furosemide (LASIX) 40 MG tablet TAKE 1 TABLET (40 MG TOTAL) BY MOUTH 2 (TWO) TIMES DAILY. ONE AT BREAKFAST AND LUNCH 180 tablet 1  . losartan (COZAAR) 100 MG tablet TAKE 1 TABLET BY MOUTH EVERY DAY 90 tablet 1  . meloxicam (MOBIC) 15 MG tablet Take 1 tablet (15 mg total) by mouth daily. 90 tablet 0  . potassium chloride SA (KLOR-CON M20) 20 MEQ tablet Take 1 tablet (20 mEq total) daily by mouth. 90 tablet 1  . predniSONE (DELTASONE) 20 MG tablet 2 po at same time daily for 3 days 6 tablet 0  . spironolactone (ALDACTONE) 25 MG tablet Take 1 tablet (25 mg total) daily by mouth. 90 tablet 1  . Blood Glucose Monitoring Suppl (BLOOD GLUCOSE MONITOR SYSTEM) w/Device KIT 1 Device 2 (two) times daily by Does not apply route. (Patient not taking: Reported on 02/07/2018) 1 each 0  . glucose blood (ONE TOUCH ULTRA TEST) test strip Use to check blood sugars twice daily (Patient not taking: Reported on 02/07/2018) 100 each 12  . Lancets (ONETOUCH ULTRASOFT) lancets Use to check blood sugars twice daily (Patient not taking: Reported on 02/07/2018) 100 each 12  . metFORMIN (GLUCOPHAGE-XR) 500 MG 24 hr tablet Take 1 tablet (500 mg total) daily with breakfast by mouth. (Patient not taking: Reported on 01/15/2018) 90 tablet 1   No current facility-administered  medications for this visit.     Patient Active Problem List   Diagnosis Date Noted  . Gout, chronic 07/01/2016  . Incomplete rotator cuff tear 09/07/2015  . Essential hypertension, benign 09/07/2015  . COPD GOLD II still smoking 03/29/2015  . Cigarette smoker 03/24/2015   Past Medical History:  Diagnosis Date  . Arthritis   . COPD (chronic obstructive pulmonary disease) (HCC)    not on home o2  . Gallstones   . Gout   . Hypertension   . Obesity   . Pneumonia     Family History  Problem Relation Age of Onset  . Asthma Mother   . Heart disease Mother   . Congestive Heart Failure Mother   . Diabetes Father   . Esophageal cancer Paternal Uncle   . Lung  disease Unknown        pat cousin  . Leukemia Brother   . Heart attack Unknown        pat cousin  . Colon cancer Neg Hx     Past Surgical History:  Procedure Laterality Date  . CHOLECYSTECTOMY    . TONSILLECTOMY     Social History   Occupational History  . Occupation: retired Nurse, mental health  . Smoking status: Former Smoker    Packs/day: 0.25    Years: 40.00    Pack years: 10.00    Types: Cigarettes    Last attempt to quit: 01/15/2017    Years since quitting: 1.0  . Smokeless tobacco: Current User    Types: Chew  . Tobacco comment: tobacco info given 03/17/16  Substance and Sexual Activity  . Alcohol use: Yes    Alcohol/week: 6.0 oz    Types: 10 Standard drinks or equivalent per week  . Drug use: No  . Sexual activity: Not on file

## 2018-03-13 ENCOUNTER — Telehealth: Payer: Self-pay | Admitting: Family Medicine

## 2018-03-13 NOTE — Telephone Encounter (Signed)
Pt notified of copay card copay card to front for pt pick up

## 2018-03-29 ENCOUNTER — Emergency Department (HOSPITAL_COMMUNITY): Payer: BLUE CROSS/BLUE SHIELD

## 2018-03-29 ENCOUNTER — Inpatient Hospital Stay (HOSPITAL_COMMUNITY)
Admission: EM | Admit: 2018-03-29 | Discharge: 2018-04-02 | DRG: 871 | Disposition: A | Discharge status: Home / Self Care | Payer: BLUE CROSS/BLUE SHIELD | Attending: Internal Medicine | Admitting: Internal Medicine

## 2018-03-29 ENCOUNTER — Encounter (HOSPITAL_COMMUNITY): Payer: Self-pay | Admitting: Emergency Medicine

## 2018-03-29 DIAGNOSIS — Z825 Family history of asthma and other chronic lower respiratory diseases: Secondary | ICD-10-CM

## 2018-03-29 DIAGNOSIS — A419 Sepsis, unspecified organism: Secondary | ICD-10-CM | POA: Diagnosis present

## 2018-03-29 DIAGNOSIS — J449 Chronic obstructive pulmonary disease, unspecified: Secondary | ICD-10-CM | POA: Diagnosis present

## 2018-03-29 DIAGNOSIS — Z833 Family history of diabetes mellitus: Secondary | ICD-10-CM

## 2018-03-29 DIAGNOSIS — I110 Hypertensive heart disease with heart failure: Secondary | ICD-10-CM | POA: Diagnosis present

## 2018-03-29 DIAGNOSIS — T383X6A Underdosing of insulin and oral hypoglycemic [antidiabetic] drugs, initial encounter: Secondary | ICD-10-CM | POA: Diagnosis present

## 2018-03-29 DIAGNOSIS — F1010 Alcohol abuse, uncomplicated: Secondary | ICD-10-CM | POA: Diagnosis present

## 2018-03-29 DIAGNOSIS — A4189 Other specified sepsis: Secondary | ICD-10-CM | POA: Diagnosis not present

## 2018-03-29 DIAGNOSIS — R6520 Severe sepsis without septic shock: Secondary | ICD-10-CM | POA: Diagnosis present

## 2018-03-29 DIAGNOSIS — B9729 Other coronavirus as the cause of diseases classified elsewhere: Secondary | ICD-10-CM | POA: Diagnosis present

## 2018-03-29 DIAGNOSIS — J329 Chronic sinusitis, unspecified: Secondary | ICD-10-CM | POA: Diagnosis present

## 2018-03-29 DIAGNOSIS — E119 Type 2 diabetes mellitus without complications: Secondary | ICD-10-CM | POA: Diagnosis present

## 2018-03-29 DIAGNOSIS — Z800 Family history of malignant neoplasm of digestive organs: Secondary | ICD-10-CM

## 2018-03-29 DIAGNOSIS — Z87891 Personal history of nicotine dependence: Secondary | ICD-10-CM

## 2018-03-29 DIAGNOSIS — J9602 Acute respiratory failure with hypercapnia: Secondary | ICD-10-CM | POA: Diagnosis present

## 2018-03-29 DIAGNOSIS — B9789 Other viral agents as the cause of diseases classified elsewhere: Secondary | ICD-10-CM | POA: Diagnosis present

## 2018-03-29 DIAGNOSIS — M79606 Pain in leg, unspecified: Secondary | ICD-10-CM | POA: Diagnosis present

## 2018-03-29 DIAGNOSIS — Z6841 Body Mass Index (BMI) 40.0 and over, adult: Secondary | ICD-10-CM

## 2018-03-29 DIAGNOSIS — Z791 Long term (current) use of non-steroidal anti-inflammatories (NSAID): Secondary | ICD-10-CM

## 2018-03-29 DIAGNOSIS — I5032 Chronic diastolic (congestive) heart failure: Secondary | ICD-10-CM | POA: Diagnosis present

## 2018-03-29 DIAGNOSIS — Z7951 Long term (current) use of inhaled steroids: Secondary | ICD-10-CM

## 2018-03-29 DIAGNOSIS — Z806 Family history of leukemia: Secondary | ICD-10-CM

## 2018-03-29 DIAGNOSIS — G9341 Metabolic encephalopathy: Secondary | ICD-10-CM | POA: Diagnosis present

## 2018-03-29 DIAGNOSIS — N179 Acute kidney failure, unspecified: Secondary | ICD-10-CM | POA: Diagnosis present

## 2018-03-29 DIAGNOSIS — W19XXXA Unspecified fall, initial encounter: Secondary | ICD-10-CM | POA: Diagnosis present

## 2018-03-29 DIAGNOSIS — G9340 Encephalopathy, unspecified: Secondary | ICD-10-CM | POA: Diagnosis not present

## 2018-03-29 DIAGNOSIS — J441 Chronic obstructive pulmonary disease with (acute) exacerbation: Secondary | ICD-10-CM

## 2018-03-29 DIAGNOSIS — M25511 Pain in right shoulder: Secondary | ICD-10-CM | POA: Diagnosis present

## 2018-03-29 DIAGNOSIS — Z91128 Patient's intentional underdosing of medication regimen for other reason: Secondary | ICD-10-CM

## 2018-03-29 DIAGNOSIS — I10 Essential (primary) hypertension: Secondary | ICD-10-CM | POA: Diagnosis present

## 2018-03-29 DIAGNOSIS — J9601 Acute respiratory failure with hypoxia: Secondary | ICD-10-CM | POA: Diagnosis present

## 2018-03-29 DIAGNOSIS — J9600 Acute respiratory failure, unspecified whether with hypoxia or hypercapnia: Secondary | ICD-10-CM | POA: Diagnosis present

## 2018-03-29 DIAGNOSIS — Z8249 Family history of ischemic heart disease and other diseases of the circulatory system: Secondary | ICD-10-CM

## 2018-03-29 LAB — PROTIME-INR
INR: 1.12
Prothrombin Time: 14.3 seconds (ref 11.4–15.2)

## 2018-03-29 LAB — COMPREHENSIVE METABOLIC PANEL
ALT: 33 U/L (ref 17–63)
ANION GAP: 11 (ref 5–15)
AST: 23 U/L (ref 15–41)
Albumin: 3.9 g/dL (ref 3.5–5.0)
Alkaline Phosphatase: 50 U/L (ref 38–126)
BUN: 15 mg/dL (ref 6–20)
CHLORIDE: 95 mmol/L — AB (ref 101–111)
CO2: 26 mmol/L (ref 22–32)
Calcium: 9 mg/dL (ref 8.9–10.3)
Creatinine, Ser: 1.38 mg/dL — ABNORMAL HIGH (ref 0.61–1.24)
GFR, EST NON AFRICAN AMERICAN: 53 mL/min — AB (ref >60)
Glucose, Bld: 107 mg/dL — ABNORMAL HIGH (ref 65–99)
POTASSIUM: 4.1 mmol/L (ref 3.5–5.1)
Sodium: 132 mmol/L — ABNORMAL LOW (ref 135–145)
Total Bilirubin: 0.7 mg/dL (ref 0.3–1.2)
Total Protein: 7.4 g/dL (ref 6.5–8.1)

## 2018-03-29 LAB — CBC WITH DIFFERENTIAL/PLATELET
BASOS PCT: 0 %
Basophils Absolute: 0 10*3/uL (ref 0.0–0.1)
EOS ABS: 0 10*3/uL (ref 0.0–0.7)
Eosinophils Relative: 0 %
HCT: 42.4 % (ref 39.0–52.0)
HEMOGLOBIN: 14.1 g/dL (ref 13.0–17.0)
Lymphocytes Relative: 16 %
Lymphs Abs: 1.9 10*3/uL (ref 0.7–4.0)
MCH: 32.3 pg (ref 26.0–34.0)
MCHC: 33.3 g/dL (ref 30.0–36.0)
MCV: 97 fL (ref 78.0–100.0)
Monocytes Absolute: 0.5 10*3/uL (ref 0.1–1.0)
Monocytes Relative: 5 %
NEUTROS PCT: 79 %
Neutro Abs: 9.7 10*3/uL — ABNORMAL HIGH (ref 1.7–7.7)
Platelets: 249 10*3/uL (ref 150–400)
RBC: 4.37 MIL/uL (ref 4.22–5.81)
RDW: 13.8 % (ref 11.5–15.5)
WBC: 12.1 10*3/uL — AB (ref 4.0–10.5)

## 2018-03-29 LAB — APTT: aPTT: 31 seconds (ref 24–36)

## 2018-03-29 LAB — LACTIC ACID, PLASMA: LACTIC ACID, VENOUS: 1.4 mmol/L (ref 0.5–1.9)

## 2018-03-29 LAB — I-STAT CG4 LACTIC ACID, ED: LACTIC ACID, VENOUS: 0.93 mmol/L (ref 0.5–1.9)

## 2018-03-29 LAB — PROCALCITONIN: PROCALCITONIN: 0.15 ng/mL

## 2018-03-29 MED ORDER — VANCOMYCIN HCL 10 G IV SOLR
2000.0000 mg | Freq: Once | INTRAVENOUS | Status: AC
  Administered 2018-03-30: 2000 mg via INTRAVENOUS
  Filled 2018-03-29: qty 2000

## 2018-03-29 MED ORDER — IPRATROPIUM-ALBUTEROL 0.5-2.5 (3) MG/3ML IN SOLN
3.0000 mL | Freq: Once | RESPIRATORY_TRACT | Status: AC
Start: 2018-03-29 — End: 2018-03-30
  Administered 2018-03-30: 3 mL via RESPIRATORY_TRACT
  Filled 2018-03-29: qty 3

## 2018-03-29 MED ORDER — VANCOMYCIN HCL IN DEXTROSE 1-5 GM/200ML-% IV SOLN
1000.0000 mg | Freq: Once | INTRAVENOUS | Status: DC
  Filled 2018-03-29: qty 200

## 2018-03-29 MED ORDER — PIPERACILLIN-TAZOBACTAM 3.375 G IVPB 30 MIN
3.3750 g | Freq: Once | INTRAVENOUS | Status: AC
  Administered 2018-03-30: 3.375 g via INTRAVENOUS
  Filled 2018-03-29: qty 50

## 2018-03-29 MED ORDER — SODIUM CHLORIDE 0.9 % IV BOLUS
4000.0000 mL | Freq: Once | INTRAVENOUS | Status: AC
  Administered 2018-03-29: 4000 mL via INTRAVENOUS

## 2018-03-29 NOTE — ED Triage Notes (Signed)
Pt BIB [facility], pt had a fall earlier today, c/o left leg, right shoulder pain. Pt has temp of 101.2, wife gave motrin and aspirin PTA, pt has new onset confusion to place, LSN unknown, productive cough. EMS vitals: BP 160/100, HR 130, CBG 131

## 2018-03-29 NOTE — ED Notes (Signed)
Pt's rectal temp was 102.8. Informed Santiago Glad - PA and Janett Billow - RN.

## 2018-03-30 ENCOUNTER — Other Ambulatory Visit: Payer: Self-pay

## 2018-03-30 ENCOUNTER — Emergency Department (HOSPITAL_COMMUNITY): Payer: BLUE CROSS/BLUE SHIELD

## 2018-03-30 ENCOUNTER — Inpatient Hospital Stay (HOSPITAL_COMMUNITY): Payer: BLUE CROSS/BLUE SHIELD

## 2018-03-30 DIAGNOSIS — R6520 Severe sepsis without septic shock: Secondary | ICD-10-CM | POA: Diagnosis present

## 2018-03-30 DIAGNOSIS — J441 Chronic obstructive pulmonary disease with (acute) exacerbation: Secondary | ICD-10-CM

## 2018-03-30 DIAGNOSIS — I110 Hypertensive heart disease with heart failure: Secondary | ICD-10-CM | POA: Diagnosis present

## 2018-03-30 DIAGNOSIS — A4189 Other specified sepsis: Secondary | ICD-10-CM | POA: Diagnosis present

## 2018-03-30 DIAGNOSIS — Z833 Family history of diabetes mellitus: Secondary | ICD-10-CM | POA: Diagnosis not present

## 2018-03-30 DIAGNOSIS — F1010 Alcohol abuse, uncomplicated: Secondary | ICD-10-CM | POA: Diagnosis present

## 2018-03-30 DIAGNOSIS — Z91128 Patient's intentional underdosing of medication regimen for other reason: Secondary | ICD-10-CM | POA: Diagnosis not present

## 2018-03-30 DIAGNOSIS — T383X6A Underdosing of insulin and oral hypoglycemic [antidiabetic] drugs, initial encounter: Secondary | ICD-10-CM | POA: Diagnosis present

## 2018-03-30 DIAGNOSIS — E119 Type 2 diabetes mellitus without complications: Secondary | ICD-10-CM | POA: Diagnosis present

## 2018-03-30 DIAGNOSIS — A419 Sepsis, unspecified organism: Secondary | ICD-10-CM

## 2018-03-30 DIAGNOSIS — J9600 Acute respiratory failure, unspecified whether with hypoxia or hypercapnia: Secondary | ICD-10-CM | POA: Diagnosis not present

## 2018-03-30 DIAGNOSIS — I10 Essential (primary) hypertension: Secondary | ICD-10-CM | POA: Diagnosis not present

## 2018-03-30 DIAGNOSIS — Z800 Family history of malignant neoplasm of digestive organs: Secondary | ICD-10-CM | POA: Diagnosis not present

## 2018-03-30 DIAGNOSIS — G9340 Encephalopathy, unspecified: Secondary | ICD-10-CM | POA: Diagnosis present

## 2018-03-30 DIAGNOSIS — B9789 Other viral agents as the cause of diseases classified elsewhere: Secondary | ICD-10-CM | POA: Diagnosis present

## 2018-03-30 DIAGNOSIS — Z825 Family history of asthma and other chronic lower respiratory diseases: Secondary | ICD-10-CM | POA: Diagnosis not present

## 2018-03-30 DIAGNOSIS — Z87891 Personal history of nicotine dependence: Secondary | ICD-10-CM | POA: Diagnosis not present

## 2018-03-30 DIAGNOSIS — I5032 Chronic diastolic (congestive) heart failure: Secondary | ICD-10-CM | POA: Diagnosis present

## 2018-03-30 DIAGNOSIS — J9601 Acute respiratory failure with hypoxia: Secondary | ICD-10-CM | POA: Diagnosis present

## 2018-03-30 DIAGNOSIS — G9341 Metabolic encephalopathy: Secondary | ICD-10-CM | POA: Diagnosis present

## 2018-03-30 DIAGNOSIS — B9729 Other coronavirus as the cause of diseases classified elsewhere: Secondary | ICD-10-CM | POA: Diagnosis present

## 2018-03-30 DIAGNOSIS — Z8249 Family history of ischemic heart disease and other diseases of the circulatory system: Secondary | ICD-10-CM | POA: Diagnosis not present

## 2018-03-30 DIAGNOSIS — W19XXXA Unspecified fall, initial encounter: Secondary | ICD-10-CM | POA: Diagnosis present

## 2018-03-30 DIAGNOSIS — Z6841 Body Mass Index (BMI) 40.0 and over, adult: Secondary | ICD-10-CM | POA: Diagnosis not present

## 2018-03-30 DIAGNOSIS — N179 Acute kidney failure, unspecified: Secondary | ICD-10-CM | POA: Diagnosis present

## 2018-03-30 DIAGNOSIS — J9602 Acute respiratory failure with hypercapnia: Secondary | ICD-10-CM | POA: Diagnosis present

## 2018-03-30 DIAGNOSIS — J329 Chronic sinusitis, unspecified: Secondary | ICD-10-CM | POA: Diagnosis present

## 2018-03-30 DIAGNOSIS — Z806 Family history of leukemia: Secondary | ICD-10-CM | POA: Diagnosis not present

## 2018-03-30 LAB — URINALYSIS, ROUTINE W REFLEX MICROSCOPIC
Bacteria, UA: NONE SEEN
Bilirubin Urine: NEGATIVE
Glucose, UA: NEGATIVE mg/dL
Ketones, ur: NEGATIVE mg/dL
Leukocytes, UA: NEGATIVE
Nitrite: NEGATIVE
PROTEIN: NEGATIVE mg/dL
SPECIFIC GRAVITY, URINE: 1.011 (ref 1.005–1.030)
SQUAMOUS EPITHELIAL / LPF: NONE SEEN
pH: 5 (ref 5.0–8.0)

## 2018-03-30 LAB — I-STAT ARTERIAL BLOOD GAS, ED
Acid-base deficit: 2 mmol/L (ref 0.0–2.0)
Acid-base deficit: 4 mmol/L — ABNORMAL HIGH (ref 0.0–2.0)
BICARBONATE: 22 mmol/L (ref 20.0–28.0)
BICARBONATE: 23.3 mmol/L (ref 20.0–28.0)
O2 SAT: 95 %
O2 SAT: 94 %
PO2 ART: 74 mmHg — AB (ref 83.0–108.0)
Patient temperature: 98.6
TCO2: 23 mmol/L (ref 22–32)
TCO2: 25 mmol/L (ref 22–32)
pCO2 arterial: 35.5 mmHg (ref 32.0–48.0)
pCO2 arterial: 48.1 mmHg — ABNORMAL HIGH (ref 32.0–48.0)
pH, Arterial: 7.4 (ref 7.350–7.450)
pH, Arterial: 7.294 — ABNORMAL LOW (ref 7.350–7.450)
pO2, Arterial: 79 mmHg — ABNORMAL LOW (ref 83.0–108.0)

## 2018-03-30 LAB — PROTEIN, CSF: Total  Protein, CSF: 26 mg/dL (ref 15–45)

## 2018-03-30 LAB — CBG MONITORING, ED
GLUCOSE-CAPILLARY: 157 mg/dL — AB (ref 65–99)
Glucose-Capillary: 162 mg/dL — ABNORMAL HIGH (ref 65–99)
Glucose-Capillary: 165 mg/dL — ABNORMAL HIGH (ref 65–99)
Glucose-Capillary: 155 mg/dL — ABNORMAL HIGH (ref 65–99)

## 2018-03-30 LAB — BASIC METABOLIC PANEL
ANION GAP: 11 (ref 5–15)
BUN: 16 mg/dL (ref 6–20)
CO2: 21 mmol/L — ABNORMAL LOW (ref 22–32)
CREATININE: 1.31 mg/dL — AB (ref 0.61–1.24)
Calcium: 7.8 mg/dL — ABNORMAL LOW (ref 8.9–10.3)
Chloride: 103 mmol/L (ref 101–111)
GFR, EST NON AFRICAN AMERICAN: 56 mL/min — AB (ref >60)
GLUCOSE: 114 mg/dL — AB (ref 65–99)
Potassium: 3.6 mmol/L (ref 3.5–5.1)
Sodium: 135 mmol/L (ref 135–145)

## 2018-03-30 LAB — GLUCOSE, CAPILLARY
GLUCOSE-CAPILLARY: 170 mg/dL — AB (ref 65–99)
GLUCOSE-CAPILLARY: 253 mg/dL — AB (ref 65–99)

## 2018-03-30 LAB — BLOOD GAS, ARTERIAL
ACID-BASE EXCESS: 0.5 mmol/L (ref 0.0–2.0)
Bicarbonate: 25 mmol/L (ref 20.0–28.0)
DRAWN BY: 23703
FIO2: 36
O2 Saturation: 91.4 %
PH ART: 7.385 (ref 7.350–7.450)
Patient temperature: 98.6
pCO2 arterial: 42.7 mmHg (ref 32.0–48.0)
pO2, Arterial: 61.2 mmHg — ABNORMAL LOW (ref 83.0–108.0)

## 2018-03-30 LAB — LACTIC ACID, PLASMA: LACTIC ACID, VENOUS: 0.9 mmol/L (ref 0.5–1.9)

## 2018-03-30 LAB — CBC
HCT: 39.5 % (ref 39.0–52.0)
Hemoglobin: 12.9 g/dL — ABNORMAL LOW (ref 13.0–17.0)
MCH: 31.9 pg (ref 26.0–34.0)
MCHC: 32.7 g/dL (ref 30.0–36.0)
MCV: 97.5 fL (ref 78.0–100.0)
Platelets: 212 10*3/uL (ref 150–400)
RBC: 4.05 MIL/uL — ABNORMAL LOW (ref 4.22–5.81)
RDW: 13.9 % (ref 11.5–15.5)
WBC: 10.8 10*3/uL — AB (ref 4.0–10.5)

## 2018-03-30 LAB — I-STAT CG4 LACTIC ACID, ED: LACTIC ACID, VENOUS: 0.94 mmol/L (ref 0.5–1.9)

## 2018-03-30 LAB — CSF CELL COUNT WITH DIFFERENTIAL
RBC COUNT CSF: 1850 /mm3 — AB
TUBE #: 3
WBC, CSF: 1 /mm3 (ref 0–5)

## 2018-03-30 LAB — INFLUENZA PANEL BY PCR (TYPE A & B)
INFLBPCR: NEGATIVE
Influenza A By PCR: NEGATIVE

## 2018-03-30 LAB — T4, FREE: Free T4: 0.97 ng/dL (ref 0.61–1.12)

## 2018-03-30 LAB — VITAMIN B12: VITAMIN B 12: 145 pg/mL — AB (ref 180–914)

## 2018-03-30 LAB — SEDIMENTATION RATE: Sed Rate: 17 mm/hr — ABNORMAL HIGH (ref 0–16)

## 2018-03-30 LAB — CRYPTOCOCCAL ANTIGEN, CSF: Crypto Ag: NEGATIVE

## 2018-03-30 LAB — TSH: TSH: 0.452 u[IU]/mL (ref 0.350–4.500)

## 2018-03-30 LAB — HIV ANTIBODY (ROUTINE TESTING W REFLEX): HIV SCREEN 4TH GENERATION: NONREACTIVE

## 2018-03-30 LAB — GLUCOSE, CSF: Glucose, CSF: 95 mg/dL — ABNORMAL HIGH (ref 40–70)

## 2018-03-30 LAB — AMMONIA: AMMONIA: 36 umol/L — AB (ref 9–35)

## 2018-03-30 LAB — MRSA PCR SCREENING: MRSA by PCR: NEGATIVE

## 2018-03-30 LAB — C-REACTIVE PROTEIN: CRP: 13.4 mg/dL — ABNORMAL HIGH (ref <1.0)

## 2018-03-30 MED ORDER — AMLODIPINE BESYLATE 5 MG PO TABS: 10.0000 mg | ORAL_TABLET | Freq: Every day | ORAL | Status: DC

## 2018-03-30 MED ORDER — IPRATROPIUM-ALBUTEROL 0.5-2.5 (3) MG/3ML IN SOLN
3.0000 mL | Freq: Four times a day (QID) | RESPIRATORY_TRACT | Status: DC
  Administered 2018-03-30 (×3): 3 mL via RESPIRATORY_TRACT
  Filled 2018-03-30 (×3): qty 3

## 2018-03-30 MED ORDER — LORAZEPAM 1 MG PO TABS: 1.0000 mg | ORAL_TABLET | Freq: Four times a day (QID) | ORAL | Status: AC | PRN

## 2018-03-30 MED ORDER — BUDESONIDE 0.5 MG/2ML IN SUSP
0.5000 mg | Freq: Two times a day (BID) | RESPIRATORY_TRACT | Status: DC
  Administered 2018-03-30 – 2018-04-02 (×7): 0.5 mg via RESPIRATORY_TRACT
  Filled 2018-03-30 (×9): qty 2

## 2018-03-30 MED ORDER — MAGNESIUM SULFATE 2 GM/50ML IV SOLN: 2.0000 g | Freq: Once | INTRAVENOUS | Status: DC

## 2018-03-30 MED ORDER — SODIUM CHLORIDE 0.9 % IV SOLN
1500.0000 mg | INTRAVENOUS | Status: DC
  Administered 2018-03-30: 1500 mg via INTRAVENOUS
  Filled 2018-03-30: qty 1500

## 2018-03-30 MED ORDER — VANCOMYCIN HCL 10 G IV SOLR: 1500.0000 mg | INTRAVENOUS | Status: DC

## 2018-03-30 MED ORDER — LIDOCAINE HCL (PF) 1 % IJ SOLN
5.0000 mL | Freq: Once | INTRAMUSCULAR | Status: AC
  Administered 2018-03-30: 5 mL via INTRADERMAL

## 2018-03-30 MED ORDER — ALBUTEROL SULFATE (2.5 MG/3ML) 0.083% IN NEBU
2.5000 mg | INHALATION_SOLUTION | Freq: Once | RESPIRATORY_TRACT | Status: AC
  Administered 2018-03-30: 2.5 mg via RESPIRATORY_TRACT
  Filled 2018-03-30: qty 3

## 2018-03-30 MED ORDER — IBUPROFEN 400 MG PO TABS
400.0000 mg | ORAL_TABLET | ORAL | Status: AC
  Administered 2018-03-30: 400 mg via ORAL
  Filled 2018-03-30: qty 1

## 2018-03-30 MED ORDER — ACETAMINOPHEN 325 MG PO TABS
650.0000 mg | ORAL_TABLET | Freq: Four times a day (QID) | ORAL | Status: DC | PRN
Start: 2018-03-30 — End: 2018-04-02
  Filled 2018-03-30: qty 2

## 2018-03-30 MED ORDER — ALLOPURINOL 100 MG PO TABS
100.0000 mg | ORAL_TABLET | Freq: Every day | ORAL | Status: DC
  Filled 2018-03-30: qty 1

## 2018-03-30 MED ORDER — LORAZEPAM 2 MG/ML IJ SOLN
0.5000 mg | Freq: Once | INTRAMUSCULAR | Status: AC
  Administered 2018-03-30: 0.5 mg via INTRAVENOUS
  Filled 2018-03-30: qty 1

## 2018-03-30 MED ORDER — ONDANSETRON HCL 4 MG PO TABS: 4.0000 mg | ORAL_TABLET | Freq: Four times a day (QID) | ORAL | Status: DC | PRN

## 2018-03-30 MED ORDER — THIAMINE HCL 100 MG/ML IJ SOLN
100.0000 mg | Freq: Every day | INTRAMUSCULAR | Status: DC
  Administered 2018-03-30: 100 mg via INTRAVENOUS
  Filled 2018-03-30 (×3): qty 2

## 2018-03-30 MED ORDER — METHYLPREDNISOLONE SODIUM SUCC 125 MG IJ SOLR
60.0000 mg | Freq: Three times a day (TID) | INTRAMUSCULAR | Status: DC
  Administered 2018-03-30 (×3): 60 mg via INTRAVENOUS
  Administered 2018-03-31: 62.5 mg via INTRAVENOUS
  Administered 2018-03-31 – 2018-04-01 (×3): 60 mg via INTRAVENOUS
  Filled 2018-03-30 (×7): qty 2

## 2018-03-30 MED ORDER — PIPERACILLIN-TAZOBACTAM 3.375 G IVPB
3.3750 g | Freq: Three times a day (TID) | INTRAVENOUS | Status: DC
  Administered 2018-03-30 – 2018-04-01 (×5): 3.375 g via INTRAVENOUS
  Filled 2018-03-30 (×6): qty 50

## 2018-03-30 MED ORDER — NICOTINE 21 MG/24HR TD PT24
21.0000 mg | MEDICATED_PATCH | Freq: Every day | TRANSDERMAL | Status: DC
  Administered 2018-03-30 – 2018-04-02 (×4): 21 mg via TRANSDERMAL
  Filled 2018-03-30 (×4): qty 1

## 2018-03-30 MED ORDER — LORAZEPAM 2 MG/ML IJ SOLN: 0.0000 mg | Freq: Two times a day (BID) | INTRAMUSCULAR | Status: DC

## 2018-03-30 MED ORDER — ONDANSETRON HCL 4 MG/2ML IJ SOLN: 4.0000 mg | Freq: Four times a day (QID) | INTRAMUSCULAR | Status: DC | PRN

## 2018-03-30 MED ORDER — ARFORMOTEROL TARTRATE 15 MCG/2ML IN NEBU
15.0000 ug | INHALATION_SOLUTION | Freq: Two times a day (BID) | RESPIRATORY_TRACT | Status: DC
  Administered 2018-03-30 – 2018-04-02 (×7): 15 ug via RESPIRATORY_TRACT
  Filled 2018-03-30 (×9): qty 2

## 2018-03-30 MED ORDER — HALOPERIDOL LACTATE 5 MG/ML IJ SOLN: 2.0000 mg | Freq: Four times a day (QID) | INTRAMUSCULAR | Status: DC | PRN

## 2018-03-30 MED ORDER — LORAZEPAM 2 MG/ML IJ SOLN
1.0000 mg | Freq: Four times a day (QID) | INTRAMUSCULAR | Status: AC | PRN
  Administered 2018-03-30: 1 mg via INTRAVENOUS
  Filled 2018-03-30: qty 1

## 2018-03-30 MED ORDER — ACETAMINOPHEN 325 MG PO TABS
650.0000 mg | ORAL_TABLET | Freq: Once | ORAL | Status: AC
Start: 2018-03-30 — End: 2018-03-30
  Administered 2018-03-30: 650 mg via ORAL
  Filled 2018-03-30: qty 2

## 2018-03-30 MED ORDER — VITAMIN B-1 100 MG PO TABS
100.0000 mg | ORAL_TABLET | Freq: Every day | ORAL | Status: DC
  Administered 2018-03-31 – 2018-04-02 (×3): 100 mg via ORAL
  Filled 2018-03-30 (×3): qty 1

## 2018-03-30 MED ORDER — CYANOCOBALAMIN 1000 MCG/ML IJ SOLN
1000.0000 ug | Freq: Once | INTRAMUSCULAR | Status: AC
  Administered 2018-03-30: 1000 ug via SUBCUTANEOUS
  Filled 2018-03-30: qty 1

## 2018-03-30 MED ORDER — ENOXAPARIN SODIUM 40 MG/0.4ML ~~LOC~~ SOLN
40.0000 mg | Freq: Every day | SUBCUTANEOUS | Status: DC
  Filled 2018-03-30: qty 0.4

## 2018-03-30 MED ORDER — METHYLPREDNISOLONE SODIUM SUCC 125 MG IJ SOLR: 60.0000 mg | Freq: Three times a day (TID) | INTRAMUSCULAR | Status: DC

## 2018-03-30 MED ORDER — IPRATROPIUM-ALBUTEROL 0.5-2.5 (3) MG/3ML IN SOLN
3.0000 mL | RESPIRATORY_TRACT | Status: DC | PRN
  Filled 2018-03-30: qty 3

## 2018-03-30 MED ORDER — PIPERACILLIN-TAZOBACTAM 3.375 G IVPB
3.3750 g | Freq: Three times a day (TID) | INTRAVENOUS | Status: DC
  Administered 2018-03-30: 3.375 g via INTRAVENOUS
  Filled 2018-03-30: qty 50

## 2018-03-30 MED ORDER — INSULIN ASPART 100 UNIT/ML ~~LOC~~ SOLN
0.0000 [IU] | SUBCUTANEOUS | Status: DC
  Administered 2018-03-30 – 2018-03-31 (×3): 2 [IU] via SUBCUTANEOUS
  Administered 2018-03-31: 5 [IU] via SUBCUTANEOUS
  Administered 2018-03-31: 3 [IU] via SUBCUTANEOUS
  Filled 2018-03-30: qty 1

## 2018-03-30 MED ORDER — ACETAMINOPHEN 650 MG RE SUPP
650.0000 mg | Freq: Four times a day (QID) | RECTAL | Status: DC | PRN
  Administered 2018-03-30: 650 mg via RECTAL
  Filled 2018-03-30: qty 1

## 2018-03-30 MED ORDER — FOLIC ACID 1 MG PO TABS: 1.0000 mg | ORAL_TABLET | Freq: Every day | ORAL | Status: DC

## 2018-03-30 MED ORDER — INSULIN ASPART 100 UNIT/ML ~~LOC~~ SOLN
0.0000 [IU] | Freq: Three times a day (TID) | SUBCUTANEOUS | Status: DC
  Administered 2018-03-30: 3 [IU] via SUBCUTANEOUS
  Filled 2018-03-30: qty 1

## 2018-03-30 MED ORDER — ADULT MULTIVITAMIN W/MINERALS CH: 1.0000 | ORAL_TABLET | Freq: Every day | ORAL | Status: DC

## 2018-03-30 MED ORDER — INSULIN ASPART 100 UNIT/ML ~~LOC~~ SOLN: 0.0000 [IU] | Freq: Every day | SUBCUTANEOUS | Status: DC

## 2018-03-30 MED ORDER — LORAZEPAM 2 MG/ML IJ SOLN
0.0000 mg | Freq: Four times a day (QID) | INTRAMUSCULAR | Status: DC
  Administered 2018-03-30: 1 mg via INTRAVENOUS

## 2018-03-30 MED ORDER — GUAIFENESIN ER 600 MG PO TB12: 600.0000 mg | ORAL_TABLET | Freq: Two times a day (BID) | ORAL | Status: DC

## 2018-03-30 NOTE — ED Notes (Signed)
[id number] 

## 2018-03-30 NOTE — ED Notes (Signed)
Pt not alert to verbal stimuli, MD notified; pt kept NPO at this time

## 2018-03-30 NOTE — Progress Notes (Signed)
EEG completed, results pending. 

## 2018-03-30 NOTE — ED Provider Notes (Addendum)
[facility] EMERGENCY DEPARTMENT Provider Note   CSN: [id number] Arrival date & time: 03/29/18  2140     History   Chief Complaint Chief Complaint  Patient presents with  . Fever    HPI Juan Merritt is a 67 y.o. male.  The history is provided by the patient. No language interpreter was used.  Fever   This is a new problem. The problem occurs constantly. The problem has been gradually worsening. The maximum temperature noted was 102 to 102.9 F. Associated symptoms include headaches. Pertinent negatives include no vomiting. He has tried nothing for the symptoms. The treatment provided no relief.  Pt complains of a fever and cough.    Past Medical History:  Diagnosis Date  . Arthritis   . COPD (chronic obstructive pulmonary disease) (HCC)    not on home o2  . Gallstones   . Gout   . Hypertension   . Obesity   . Pneumonia     Patient Active Problem List   Diagnosis Date Noted  . Gout, chronic 07/01/2016  . Incomplete rotator cuff tear 09/07/2015  . Essential hypertension, benign 09/07/2015  . COPD GOLD II still smoking 03/29/2015  . Cigarette smoker 03/24/2015    Past Surgical History:  Procedure Laterality Date  . CHOLECYSTECTOMY    . TONSILLECTOMY          Home Medications    Prior to Admission medications   Medication Sig Start Date End Date Taking? Authorizing Provider  albuterol (PROVENTIL) (2.5 MG/3ML) 0.083% nebulizer solution Take 3 mLs (2.5 mg total) by nebulization every 6 (six) hours as needed for wheezing or shortness of breath. 07/18/17  Yes Stacks, Cletus Gash, MD  traMADol (ULTRAM) 50 MG tablet Take 50-100 mg by mouth as needed. 03/05/18  Yes [provider]  allopurinol (ZYLOPRIM) 100 MG tablet TAKE 1 TABLET (100 MG TOTAL) BY MOUTH DAILY. TO PREVENT GOUT Patient taking differently: 100 mg daily. TAKE 1 TABLET (100 MG TOTAL) BY MOUTH DAILY. TO PREVENT GOUT 10/27/17   [name], MD  amLODipine (NORVASC) 10 MG tablet  TAKE 1 TABLET (10 MG TOTAL) BY MOUTH DAILY. Patient taking differently: Take 10 mg by mouth daily. TAKE 1 TABLET (10 MG TOTAL) BY MOUTH DAILY. 10/27/17   [name], MD  budesonide-formoterol (SYMBICORT) 160-4.5 MCG/ACT inhaler Inhale 2 puffs 2 (two) times daily into the lungs. 10/27/17   [name], MD  furosemide (LASIX) 40 MG tablet TAKE 1 TABLET (40 MG TOTAL) BY MOUTH 2 (TWO) TIMES DAILY. ONE AT [place] Patient taking differently: Take 40 mg by mouth 2 (two) times daily. TAKE 1 TABLET (40 MG TOTAL) BY MOUTH 2 (TWO) TIMES DAILY. ONE AT [facility] AND LUNCH 10/27/17   [name], MD  losartan (COZAAR) 100 MG tablet TAKE 1 TABLET BY MOUTH EVERY DAY Patient taking differently: TAKE 100 mg TABLET BY MOUTH EVERY DAY 09/25/17   [name], MD  meloxicam (MOBIC) 15 MG tablet Take 1 tablet (15 mg total) by mouth daily. 02/07/18   [name], MD  metFORMIN (GLUCOPHAGE-XR) 500 MG 24 hr tablet Take 1 tablet (500 mg total) daily with breakfast by mouth. Patient not taking: Reported on 01/15/2018 10/27/17   [name], MD  potassium chloride SA (KLOR-CON M20) 20 MEQ tablet Take 1 tablet (20 mEq total) daily by mouth. 10/27/17   [name], MD  predniSONE (DELTASONE) 20 MG tablet 2 po at same time daily for 3 days Patient not taking: Reported on 03/29/2018 01/30/18  [name], MD  spironolactone (ALDACTONE) 25 MG tablet Take 1 tablet (25 mg total) daily by mouth. 10/27/17   [name], MD    Family History Family History  Problem Relation Age of Onset  . Asthma Mother   . Heart disease Mother   . Congestive Heart Failure Mother   . Diabetes Father   . Esophageal cancer Paternal Uncle   . Lung disease Unknown        pat cousin  . Leukemia Brother   . Heart attack Unknown        pat cousin  . Colon cancer Neg Hx     Social History Social History   Tobacco Use  . Smoking status: Former Smoker    Packs/day: 0.25    Years: 40.00    Pack years: 10.00     Types: Cigarettes    Last attempt to quit: 01/15/2017    Years since quitting: 1.2  . Smokeless tobacco: Current User    Types: Chew  . Tobacco comment: tobacco info given 03/17/16  Substance Use Topics  . Alcohol use: Yes    Alcohol/week: 6.0 oz    Types: 10 Standard drinks or equivalent per week  . Drug use: No     Allergies   Patient has no known allergies.   Review of Systems Review of Systems  Constitutional: Positive for fever.  Gastrointestinal: Negative for vomiting.  Neurological: Positive for headaches.  All other systems reviewed and are negative.    Physical Exam Updated Vital Signs BP (!) 126/58   Pulse (!) 114   Temp (!) 102.8 F (39.3 C) (Rectal)   Resp (!) 24   Ht 5\' 9"  (1.753 m)   Wt 133.4 kg (294 lb)   SpO2 93%   BMI 43.42 kg/m   Physical Exam  Constitutional: He appears well-developed and well-nourished.  HENT:  Head: Normocephalic and atraumatic.  Right Ear: External ear normal.  Left Ear: External ear normal.  Eyes: Conjunctivae are normal.  Neck: Neck supple.  Cardiovascular: Normal rate and regular rhythm.  No murmur heard. Pulmonary/Chest: He is in respiratory distress. He has wheezes.  Abdominal: Soft. There is no tenderness.  Musculoskeletal: He exhibits no edema.  Neurological: He is alert.  Skin: Skin is warm and dry.  Psychiatric: He has a normal mood and affect.  Nursing note and vitals reviewed.    ED Treatments / Results  Labs (all labs ordered are listed, but only abnormal results are displayed) Labs Reviewed  COMPREHENSIVE METABOLIC PANEL - Abnormal; Notable for the following components:      Result Value   Sodium 132 (*)    Chloride 95 (*)    Glucose, Bld 107 (*)    Creatinine, Ser 1.38 (*)    GFR calc non Af Amer 53 (*)    All other components within normal limits  CBC WITH DIFFERENTIAL/PLATELET - Abnormal; Notable for the following components:   WBC 12.1 (*)    Neutro Abs 9.7 (*)    All other components  within normal limits  CULTURE, BLOOD (ROUTINE X 2)  CULTURE, BLOOD (ROUTINE X 2)  LACTIC ACID, PLASMA  PROCALCITONIN  PROTIME-INR  APTT  URINALYSIS, ROUTINE W REFLEX MICROSCOPIC  LACTIC ACID, PLASMA  INFLUENZA PANEL BY PCR (TYPE A & B)  I-STAT CG4 LACTIC ACID, ED  I-STAT CG4 LACTIC ACID, ED  I-STAT CG4 LACTIC ACID, ED    EKG None  Radiology Dg Chest Port 1 View  Result Date: 03/29/2018 CLINICAL DATA:  67 y/o M; fall, left leg and right shoulder pain, fever, confusion, productive cough. EXAM: PORTABLE CHEST 1 VIEW COMPARISON:  07/18/2017 chest radiograph FINDINGS: Stable cardiac silhouette given projection and technique. Calcific aortic atherosclerosis. Hyperinflated lungs compatible with COPD. No focal consolidation. Bones are unremarkable. IMPRESSION: No active disease. Electronically Signed   By: Kristine Garbe M.D.   On: 03/29/2018 22:40    Procedures Procedures (including critical care time)  Medications Ordered in ED Medications  ipratropium-albuterol (DUONEB) 0.5-2.5 (3) MG/3ML nebulizer solution 3 mL (has no administration in time range)  piperacillin-tazobactam (ZOSYN) IVPB 3.375 g (3.375 g Intravenous New Bag/Given 03/30/18 0006)  vancomycin (VANCOCIN) 2,000 mg in sodium chloride 0.9 % 500 mL IVPB (has no administration in time range)  sodium chloride 0.9 % bolus 4,000 mL (4,000 mLs Intravenous New Bag/Given 03/29/18 2223)     Initial Impression / Assessment and Plan / ED Course  I have reviewed the triage vital signs and the nursing notes.  Pertinent labs & imaging results that were available during my care of the patient were reviewed by me and considered in my medical decision making (see chart for details)   Total critical care time: 35 minutes Critical care time was exclusive of separately billable procedures and treating other patients. Critical care was necessary to treat or prevent imminent or life-threatening deterioration. Critical care was time  spent personally by me on the following activities: development of treatment plan with patient and/or surrogate as well as nursing, discussions with consultants, evaluation of patient's response to treatment, examination of patient, obtaining history from patient or surrogate, ordering and performing treatments and interventions, ordering and review of laboratory studies, ordering and review of radiographic studies, pulse oximetry and re-evaluation of patient's condition.  MDM  Influenza negative.  ua negative.   Pt reports feeling more short of breath.  Pt has wheezing in all lobes Albuterol neb ordered.  Repeat chest xray I discussed with Hospitalist who will admit.   Final Clinical Impressions(s) / ED Diagnoses   Final diagnoses:  Sepsis [facility])    ED Discharge Orders    None       Sidney Ace 03/30/18 [place], PA-C 03/30/18 1093    Tegeler, Gwenyth Allegra, MD 04/02/18 1031    Fransico Meadow, [place] 04/20/18 1555

## 2018-03-30 NOTE — ED Notes (Signed)
Pt reporting incr'd difficulty breathing. Delo, MD notified. Ordered to d/c IV fluids at this point.

## 2018-03-30 NOTE — ED Notes (Signed)
Pt demonstrating incr'd agitation, insisting on getting out of bed and sitting in chair instead. Remind pt of recent falls and confusion, leading to importance of remaining in stretcher for the time being.

## 2018-03-30 NOTE — Progress Notes (Signed)
RT to bedside for bipap per MD order. RN informed RT he was just assigned a bed on 5W. RN to call and ensure pt can be on that unit with bipap

## 2018-03-30 NOTE — Progress Notes (Signed)
Patient's ABG results were within normal ranges. The patient declined the BIPAP tonight but stated that he will call if he feels like he needs it.

## 2018-03-30 NOTE — ED Notes (Signed)
Condom cath applied

## 2018-03-30 NOTE — Progress Notes (Signed)
Pharmacy Antibiotic Note  Juan Merritt is a 67 y.o. male admitted on 03/29/2018 with sepsis.  Pharmacy has been consulted for Vancomycin/Zosyn dosing. Pt with fever/confusion. WBC mildly elevated. Noted bump in Scr. Normalized CrCl is ~55.   Plan: -Vancomycin 2000 mg IV x 1, then 1500 mg IV q24h, increase dose if renal function improves -Zosyn 3.375G IV q8h to be infused over 4 hours -Trend WBC, temp, renal function  -F/U infectious work-up -Drug levels as indicated   Height: 5\' 9"  (175.3 cm) Weight: 294 lb (133.4 kg) IBW/kg (Calculated) : 70.7  Temp (24hrs), Avg:103 F (39.4 C), Min:102.8 F (39.3 C), Max:103.2 F (39.6 C)  Recent Labs  Lab 03/29/18 2203 03/29/18 2227 03/29/18 2238 03/30/18 0024  WBC  --  12.1*  --   --   CREATININE  --  1.38*  --   --   LATICACIDVEN 1.4  --  0.93 0.94    Estimated Creatinine Clearance: 73.3 mL/min (A) (by C-G formula based on SCr of 1.38 mg/dL (H)).    No Known Allergies    Narda Bonds 03/30/2018 3:56 AM

## 2018-03-30 NOTE — ED Notes (Addendum)
Paged MD regarding pt's respiratory status

## 2018-03-30 NOTE — ED Notes (Signed)
Respiratory at bedside.

## 2018-03-30 NOTE — ED Notes (Signed)
Ax. Temp 103.2*F

## 2018-03-30 NOTE — Progress Notes (Signed)
[facility] Progress Note  Patient: Juan Merritt XKG:[id number]   PCP: [name], MD DOB: 30-Jan-1951   DOA: 03/29/2018   DOS: 03/30/2018   Date of Service: the patient was seen and examined on 03/30/2018  Subjective: Patient was seen in the ER.  Mentation progressively worsening.  No nausea no vomiting.  Responding to painful stimuli only.  Unable to follow commands.  Fever finally subsiding.  Wife at bedside.  Brief hospital course: Pt. with PMH of HTN, COPD, morbid obesity, gout, alcohol, tobacco abuse; admitted on 03/29/2018, presented with complaint of shortness of breath and fever, was found to have acute encephalopathy. Currently further plan is current treatment.  Assessment and Plan: 1.  Severe sepsis. Acute metabolic encephalopathy. Sinusitis. Temperature 103.4.  Tachycardia.  Tachypnea. Unable to follow any commands right now.  Withdraws to painful stimuli. Chest x-ray unremarkable. Urinalysis unremarkable. Influenza PCR negative. Lactic acid negative. Pro-calcitonin level normal. Blood cultures currently pending. Urine culture currently pending. CT of the head shows sinusitis. no evidence of cellulitis on exam. With encephalopathy as well as fever without any acute etiology LP was performed. Patient tolerated very well. Shows no evidence of significant leukocytosis. Do not think that the patient has meningitis and requires higher antibiotic coverage. Continue with IV vancomycin and Zosyn for now.  2.  Acute hypoxic hypercarbic respiratory failure. Metabolic encephalopathy acute. Mentation progressively worsening in hospital. ABG initially on admission was unremarkable but now shows acute hypercarbic respiratory failure. This is likely progression of his metabolic encephalopathy due to sepsis as well as a combination of lorazepam causing encephalopathy. At present we will put the patient on BiPAP and repeat ABG in 1 hour. Continue on stepdown unit.  3.?   Alcohol abuse. Family reports 2-3 beers on a daily basis. Last beer just yesterday. Do not think the patient is actually having withdrawal or delirium right now. We will monitor.  4.  Essential hypertension. Blood pressure currently stable. Monitor.  5.  Type 2 diabetes mellitus. Every 4 hours sliding scale at present.  Diet: N.p.o. due to aspiration risk. If patient is more awake we will transition him to cardiac diet DVT Prophylaxis: subcutaneous Heparin  Advance goals of care discussion: full code  Family Communication: family was present at bedside, at the time of interview. The pt provided permission to discuss medical plan with the family. Opportunity was given to ask question and all questions were answered satisfactorily.   Disposition:  Discharge to home.  Consultants: IR Procedures: LP  Antibiotics: Anti-infectives (From admission, onward)   Start     Dose/Rate Route Frequency Ordered Stop   03/30/18 2200  vancomycin (VANCOCIN) 1,500 mg in sodium chloride 0.9 % 500 mL IVPB  Status:  Discontinued     1,500 mg 250 mL/hr over 120 Minutes Intravenous Every 24 hours 03/30/18 0400 03/30/18 1030   03/30/18 0600  piperacillin-tazobactam (ZOSYN) IVPB 3.375 g  Status:  Discontinued     3.375 g 12.5 mL/hr over 240 Minutes Intravenous Every 8 hours 03/30/18 0358 03/30/18 1030   03/29/18 2330  piperacillin-tazobactam (ZOSYN) IVPB 3.375 g     3.375 g 100 mL/hr over 30 Minutes Intravenous  Once 03/29/18 2316 03/30/18 0036   03/29/18 2330  vancomycin (VANCOCIN) IVPB 1000 mg/200 mL premix  Status:  Discontinued     1,000 mg 200 mL/hr over 60 Minutes Intravenous  Once 03/29/18 2316 03/29/18 2326   03/29/18 2330  vancomycin (VANCOCIN) 2,000 mg in sodium chloride 0.9 % 500 mL IVPB  2,000 mg 250 mL/hr over 120 Minutes Intravenous  Once 03/29/18 2327 03/30/18 0230       Objective: Physical Exam: Vitals:   03/30/18 1700 03/30/18 1715 03/30/18 1730 03/30/18 1745  BP: 128/87  129/84 (!) 136/92 136/88  Pulse: 94 90 91 91  Resp: (!) 24 (!) 24 (!) 26 (!) 23  Temp:      TempSrc:      SpO2: 95% 96% 97% 96%  Weight:      Height:        Intake/Output Summary (Last 24 hours) at 03/30/2018 1831 Last data filed at 03/30/2018 1811 Gross per 24 hour  Intake 6600 ml  Output 1275 ml  Net 5325 ml   Filed Weights   03/29/18 2230  Weight: 133.4 kg (294 lb)   General: Drowsy lethargic, unable to follow any commands, Appear in marked distress, affect difficult to assess Eyes: PERRL, Conjunctiva normal ENT: Oral Mucosa clear moist. Neck: difficult to assess JVD, no Abnormal Mass Or lumps Cardiovascular: S1 and S2 Present, no Murmur, Peripheral Pulses Present Respiratory: increased respiratory effort, Bilateral Air entry equal and Decreased, no use of accessory muscle, Clear to Auscultation, no Crackles, no wheezes Abdomen: Bowel Sound present, Soft and no tenderness, no hernia Skin: no redness, no Rash, no induration Extremities: trace Pedal edema, no calf tenderness Neurologic: Bilateral reflexes present. Babinski equivocal. Withdraws to painful stimuli.   Data Reviewed: CBC: Recent Labs  Lab 03/29/18 2227 03/30/18 0356  WBC 12.1* 10.8*  NEUTROABS 9.7*  --   HGB 14.1 12.9*  HCT 42.4 39.5  MCV 97.0 97.5  PLT 249 737   Basic Metabolic Panel: Recent Labs  Lab 03/29/18 2227 03/30/18 0356  NA 132* 135  K 4.1 3.6  CL 95* 103  CO2 26 21*  GLUCOSE 107* 114*  BUN 15 16  CREATININE 1.38* 1.31*  CALCIUM 9.0 7.8*    Liver Function Tests: Recent Labs  Lab 03/29/18 2227  AST 23  ALT 33  ALKPHOS 50  BILITOT 0.7  PROT 7.4  ALBUMIN 3.9   No results for input(s): LIPASE, AMYLASE in the last 168 hours. Recent Labs  Lab 03/30/18 1032  AMMONIA 36*   Coagulation Profile: Recent Labs  Lab 03/29/18 2227  INR 1.12   Cardiac Enzymes: No results for input(s): CKTOTAL, CKMB, CKMBINDEX, TROPONINI in the last 168 hours. BNP (last 3 results) No  results for input(s): PROBNP in the last 8760 hours. CBG: Recent Labs  Lab 03/30/18 0922 03/30/18 1256 03/30/18 1448 03/30/18 1648  GLUCAP 162* 165* 157* 155*   Studies: Ct Head Wo Contrast  Result Date: 03/30/2018 CLINICAL DATA:  New onset confusion. Fever. Recent fall. Initial encounter. EXAM: CT HEAD WITHOUT CONTRAST TECHNIQUE: Contiguous axial images were obtained from the base of the skull through the vertex without intravenous contrast. COMPARISON:  None. FINDINGS: Brain: There is no evidence of acute infarct, intracranial hemorrhage, mass, midline shift, or extra-axial fluid collection. The ventricles and sulci are normal in size. A cavum septum pellucidum et vergae is incidentally noted. Periventricular white matter hypodensities are nonspecific but compatible with minimal chronic small vessel ischemic disease. Vascular: Mild calcified atherosclerosis in the carotid siphons. No hyperdense vessel. Skull: No fracture or focal osseous lesion. Sinuses/Orbits: Prominent circumferential mucosal thickening and bubbly secretions in the maxillary sinuses. Moderate right and mild left ethmoid air cell and mild frontal sinus mucosal thickening bilaterally. Clear mastoid air cells. Unremarkable orbits. Other: None. IMPRESSION: 1. No evidence of acute intracranial abnormality. 2. Minimal chronic  small vessel ischemic disease. 3. Sinusitis. Electronically Signed   By: Logan Bores M.D.   On: 03/30/2018 09:08   Dg Chest Port 1 View  Result Date: 03/30/2018 CLINICAL DATA:  Sepsis. EXAM: PORTABLE CHEST 1 VIEW COMPARISON:  Radiograph yesterday FINDINGS: Stable cardiomegaly and mediastinal contours. Minimal retrocardiac atelectasis. No confluent airspace disease. No pulmonary edema, large pleural effusion or pneumothorax. Stable osseous structures. IMPRESSION: Stable cardiomegaly.  Mild retrocardiac atelectasis. Electronically Signed   By: Jeb Levering M.D.   On: 03/30/2018 02:47   Dg Chest Port 1  View  Result Date: 03/29/2018 CLINICAL DATA:  67 y/o M; fall, left leg and right shoulder pain, fever, confusion, productive cough. EXAM: PORTABLE CHEST 1 VIEW COMPARISON:  07/18/2017 chest radiograph FINDINGS: Stable cardiac silhouette given projection and technique. Calcific aortic atherosclerosis. Hyperinflated lungs compatible with COPD. No focal consolidation. Bones are unremarkable. IMPRESSION: No active disease. Electronically Signed   By: Kristine Garbe M.D.   On: 03/29/2018 22:40   Dg Fluoro Guide Lumbar Puncture  Result Date: 03/30/2018 CLINICAL DATA:  Acute encephalopathy.  Sepsis. EXAM: DIAGNOSTIC LUMBAR PUNCTURE UNDER FLUOROSCOPIC GUIDANCE FLUOROSCOPY TIME:  Fluoroscopy Time:  0 minutes and 36 seconds Radiation Exposure Index (if provided by the fluoroscopic device): 425.45 micro Gy per meter squared Number of Acquired Spot Images: 1 PROCEDURE: Informed consent was obtained from the patient prior to the procedure, including potential complications of headache, allergy, and pain. With the patient prone, the lower back was prepped with Betadine. 1% Lidocaine was used for local anesthesia. Lumbar puncture was performed at the L3 level using a 20 gauge needle with return of initial blood tinged followed by clear CSF. Opening pressure was not performed due to the patient's difficulty holding still. There was no subjective evidence of increased CSF pressure. Twelve ml of CSF were obtained for laboratory studies. The patient tolerated the procedure well and there were no apparent complications. IMPRESSION: Successful fluoroscopically guided lumbar puncture for acquisition of CSF. Electronically Signed   By: Lajean Manes M.D.   On: 03/30/2018 12:36    Scheduled Meds: . arformoterol  15 mcg Nebulization BID  . budesonide (PULMICORT) nebulizer solution  0.5 mg Nebulization BID  . insulin aspart  0-9 Units Subcutaneous Q4H  . ipratropium-albuterol  3 mL Nebulization QID  . LORazepam  0-4 mg  Intravenous Q6H   Followed by  . [START ON 04/01/2018] LORazepam  0-4 mg Intravenous Q12H  . methylPREDNISolone (SOLU-MEDROL) injection  60 mg Intravenous Q8H  . nicotine  21 mg Transdermal Daily  . thiamine  100 mg Oral Daily   Or  . thiamine  100 mg Intravenous Daily   Continuous Infusions: PRN Meds: acetaminophen **OR** acetaminophen, haloperidol lactate, ipratropium-albuterol, LORazepam **OR** LORazepam, ondansetron **OR** ondansetron (ZOFRAN) IV  Time spent: The patient is critically ill with multiple organ systems failure and requires high complexity decision making for assessment and support, frequent evaluation and titration of therapies. Critical Care Time devoted to patient care services described in this note is 40 minutes   Author: Berle Mull, MD [facility] Pager: [phone] 03/30/2018 6:31 PM  If 7PM-7AM, please contact night-coverage at [email or web address], password [facility]

## 2018-03-30 NOTE — ED Notes (Signed)
ORDERED DIET TRAY FOR PT  

## 2018-03-30 NOTE — ED Notes (Signed)
Patient transported to CT 

## 2018-03-30 NOTE — H&P (Addendum)
History and Physical    Juan Merritt ZOX:[id number] DOB: 08-02-1951 DOA: 03/29/2018  Referring MD/NP/PA: Marcene Brawn, PA-C PCP: [name], MD  Patient coming from: Home  Chief Complaint: Shortness of breath and fever I have personally briefly reviewed patient's old medical records in [place]   HPI: Juan Merritt is a 67 y.o. male with medical history significant of HTN, COPD not on home oxygen,, morbid obesity, gout, alcohol use, and tobacco abuse; who reports with complaints of shortness of breath and fever.  Over the last few days patient had been developing sinus congestion with a productive cough and wheezing.  Yesterday, patient's wife noted that he appeared significantly ill and was disoriented.  She notes that he had been leaning over in the kitchen and appeared ashen gray color, and was witnessed to lose consciousness falling backwards.  She notes that he had lost consciousness only for a short period in time and she ran to called EMS.  She had questioned if he had had a stroke or seizure.  His wife checked his temperature and noted it was 101.2 F.  She had given him 324 mg of aspirin and some Motrin to treat symptoms.  He complained of pain in his shoulder and leg following a fall.  Patient normally drinks 2-3 beers a day on average and smokes nicotine with a vaping device.  Denies any recent sick contacts to her knowledge, trauma does say, loss of bowel/bladder, chest pain, nausea, vomiting, or diarrhea.  ED Course: Upon admission into the emergency department patient was seen to be febrile up to 103.2 F, heart rates 102 at 132, respirations 19-35, blood pressures 101/85-135/77, and O2 saturation 90-97%.  Labs revealed WBC 12.1, lactic acid 1.4, pro calcitonin 0.15, sodium 132, BUN 15, creatinine 1.38.  Influenza screen was negative.  Chest x-ray showed hyperinflation compatible with COPD with no acute infiltrate.  Patient received 1 DuoNeb and 1 hour long albuterol  treatment.  Sepsis protocol was initiated and patient was given 4 L normal saline IV fluids, vancomycin, and Zosyn.  Review of Systems  Constitutional: Positive for chills, diaphoresis, fever and malaise/fatigue. Negative for weight loss.  HENT: Positive for congestion and sinus pain. Negative for ear discharge.   Eyes: Negative for double vision and photophobia.  Respiratory: Positive for cough, sputum production, shortness of breath and wheezing.   Cardiovascular: Positive for leg swelling. Negative for chest pain.  Gastrointestinal: Negative for abdominal pain, nausea and vomiting.  Genitourinary: Negative for dysuria and hematuria.  Musculoskeletal: Positive for back pain, falls and joint pain.  Skin: Negative for itching and rash.  Neurological: Positive for loss of consciousness. Negative for seizures.  Endo/Heme/Allergies: Does not bruise/bleed easily.  Psychiatric/Behavioral: Negative for memory loss. The patient is nervous/anxious.     Past Medical History:  Diagnosis Date  . Arthritis   . COPD (chronic obstructive pulmonary disease) (HCC)    not on home o2  . Gallstones   . Gout   . Hypertension   . Obesity   . Pneumonia     Past Surgical History:  Procedure Laterality Date  . CHOLECYSTECTOMY    . TONSILLECTOMY       reports that he quit smoking about 14 months ago. His smoking use included cigarettes. He has a 10.00 pack-year smoking history. His smokeless tobacco use includes chew. He reports that he drinks about 6.0 oz of alcohol per week. He reports that he does not use drugs.  No Known Allergies  Family  History  Problem Relation Age of Onset  . Asthma Mother   . Heart disease Mother   . Congestive Heart Failure Mother   . Diabetes Father   . Esophageal cancer Paternal Uncle   . Lung disease Unknown        pat cousin  . Leukemia Brother   . Heart attack Unknown        pat cousin  . Colon cancer Neg Hx     Prior to Admission medications   Medication  Sig Start Date End Date Taking? Authorizing Provider  albuterol (PROVENTIL) (2.5 MG/3ML) 0.083% nebulizer solution Take 3 mLs (2.5 mg total) by nebulization every 6 (six) hours as needed for wheezing or shortness of breath. 07/18/17  Yes Stacks, Cletus Gash, MD  traMADol (ULTRAM) 50 MG tablet Take 50-100 mg by mouth as needed. 03/05/18  Yes [provider]  allopurinol (ZYLOPRIM) 100 MG tablet TAKE 1 TABLET (100 MG TOTAL) BY MOUTH DAILY. TO PREVENT GOUT Patient taking differently: 100 mg daily. TAKE 1 TABLET (100 MG TOTAL) BY MOUTH DAILY. TO PREVENT GOUT 10/27/17   [name], MD  amLODipine (NORVASC) 10 MG tablet TAKE 1 TABLET (10 MG TOTAL) BY MOUTH DAILY. Patient taking differently: Take 10 mg by mouth daily. TAKE 1 TABLET (10 MG TOTAL) BY MOUTH DAILY. 10/27/17   [name], MD  budesonide-formoterol (SYMBICORT) 160-4.5 MCG/ACT inhaler Inhale 2 puffs 2 (two) times daily into the lungs. 10/27/17   [name], MD  furosemide (LASIX) 40 MG tablet TAKE 1 TABLET (40 MG TOTAL) BY MOUTH 2 (TWO) TIMES DAILY. ONE AT [place] Patient taking differently: Take 40 mg by mouth 2 (two) times daily. TAKE 1 TABLET (40 MG TOTAL) BY MOUTH 2 (TWO) TIMES DAILY. ONE AT [facility] AND LUNCH 10/27/17   [name], MD  losartan (COZAAR) 100 MG tablet TAKE 1 TABLET BY MOUTH EVERY DAY Patient taking differently: TAKE 100 mg TABLET BY MOUTH EVERY DAY 09/25/17   [name], MD  meloxicam (MOBIC) 15 MG tablet Take 1 tablet (15 mg total) by mouth daily. 02/07/18   [name], MD  metFORMIN (GLUCOPHAGE-XR) 500 MG 24 hr tablet Take 1 tablet (500 mg total) daily with breakfast by mouth. Patient not taking: Reported on 01/15/2018 10/27/17   [name], MD  potassium chloride SA (KLOR-CON M20) 20 MEQ tablet Take 1 tablet (20 mEq total) daily by mouth. 10/27/17   [name], MD  predniSONE (DELTASONE) 20 MG tablet 2 po at same time daily for 3 days Patient not taking: Reported on 03/29/2018 01/30/18    [name], MD  spironolactone (ALDACTONE) 25 MG tablet Take 1 tablet (25 mg total) daily by mouth. 10/27/17   [name], MD    Physical Exam:  Constitutional: Obese male who appears sick and in to in moderate distress   Vitals:   03/30/18 0045 03/30/18 0046 03/30/18 0100 03/30/18 0115  BP: 135/77 135/77 130/82 132/85  Pulse: (!) 123 (!) 125 (!) 123   Resp: (!) 26 (!) 29 (!) 23 19  Temp:  (!) 103.2 F (39.6 C)    TempSrc:  Axillary    SpO2: 92% 93% 95%   Weight:      Height:       Eyes: PERRL, lids and conjunctivae normal ENMT: Mucous membranes are dry.  Erythematous nasal turbinates with complaints of pressure with palpation. Neck: normal, supple, no masses, no thyromegaly Respiratory: Tachypneic with decreased aeration and positive wheeze.  Patient only able to talk in short  sentences and some accessory muscle use is noted. Cardiovascular: Tachycardic, no murmurs / rubs / gallops.  Trace lower extremity extremity edema. 2+ pedal pulses. No carotid bruits.  Abdomen: Protuberant abdomen, no tenderness, no masses palpated. No hepatosplenomegaly. Bowel sounds positive.  Musculoskeletal: no clubbing / cyanosis. No joint deformity upper and lower extremities. Good ROM, no contractures. Normal muscle tone.  Skin: no rashes, lesions, ulcers. No induration Neurologic: CN 2-12 grossly intact. Sensation intact, DTR normal. Strength 5/5 in all 4.  Psychiatric: Normal judgment and insight. Alert and oriented x 3.  Restless mood.     Labs on Admission: I have personally reviewed following labs and imaging studies  CBC: Recent Labs  Lab 03/29/18 2227  WBC 12.1*  NEUTROABS 9.7*  HGB 14.1  HCT 42.4  MCV 97.0  PLT 417   Basic Metabolic Panel: Recent Labs  Lab 03/29/18 2227  NA 132*  K 4.1  CL 95*  CO2 26  GLUCOSE 107*  BUN 15  CREATININE 1.38*  CALCIUM 9.0   GFR: Estimated Creatinine Clearance: 73.3 mL/min (A) (by C-G formula based on SCr of 1.38 mg/dL (H)). Liver  Function Tests: Recent Labs  Lab 03/29/18 2227  AST 23  ALT 33  ALKPHOS 50  BILITOT 0.7  PROT 7.4  ALBUMIN 3.9   No results for input(s): LIPASE, AMYLASE in the last 168 hours. No results for input(s): AMMONIA in the last 168 hours. Coagulation Profile: Recent Labs  Lab 03/29/18 2227  INR 1.12   Cardiac Enzymes: No results for input(s): CKTOTAL, CKMB, CKMBINDEX, TROPONINI in the last 168 hours. BNP (last 3 results) No results for input(s): PROBNP in the last 8760 hours. HbA1C: No results for input(s): HGBA1C in the last 72 hours. CBG: No results for input(s): GLUCAP in the last 168 hours. Lipid Profile: No results for input(s): CHOL, HDL, LDLCALC, TRIG, CHOLHDL, LDLDIRECT in the last 72 hours. Thyroid Function Tests: No results for input(s): TSH, T4TOTAL, FREET4, T3FREE, THYROIDAB in the last 72 hours. Anemia Panel: No results for input(s): VITAMINB12, FOLATE, FERRITIN, TIBC, IRON, RETICCTPCT in the last 72 hours. Urine analysis:    Component Value Date/Time   COLORURINE YELLOW 03/30/2018 0014   APPEARANCEUR CLEAR 03/30/2018 0014   APPEARANCEUR Hazy (A) 10/27/2017 0940   LABSPEC 1.011 03/30/2018 0014   PHURINE 5.0 03/30/2018 0014   GLUCOSEU NEGATIVE 03/30/2018 0014   HGBUR SMALL (A) 03/30/2018 0014   BILIRUBINUR NEGATIVE 03/30/2018 0014   BILIRUBINUR Negative 10/27/2017 0940   KETONESUR NEGATIVE 03/30/2018 0014   PROTEINUR NEGATIVE 03/30/2018 0014   NITRITE NEGATIVE 03/30/2018 0014   LEUKOCYTESUR NEGATIVE 03/30/2018 0014   LEUKOCYTESUR 3+ (A) 10/27/2017 0940   Sepsis Labs: No results found for this or any previous visit (from the past 240 hour(s)).   Radiological Exams on Admission: Dg Chest Port 1 View  Result Date: 03/29/2018 CLINICAL DATA:  67 y/o M; fall, left leg and right shoulder pain, fever, confusion, productive cough. EXAM: PORTABLE CHEST 1 VIEW COMPARISON:  07/18/2017 chest radiograph FINDINGS: Stable cardiac silhouette given projection and  technique. Calcific aortic atherosclerosis. Hyperinflated lungs compatible with COPD. No focal consolidation. Bones are unremarkable. IMPRESSION: No active disease. Electronically Signed   By: Kristine Garbe M.D.   On: 03/29/2018 22:40    EKG: Independently reviewed.  Sinus tachycardia 125 bpm  Assessment/Plan Sepsis, unknown source: Acute.  Patient presents with fever up to 103.4 F, tachycardia, tachypneic, and with complaints of cough and congestion.  WBC elevated at 12.1 but lactic acid reassuring  at 1.4.  Chest x-ray otherwise noted to be clear for signs of infection.  Sepsis protocol was initiated with full - Admit to stepdown - Add on respiratory virus panel - Follow-up blood cultures - Continue empiric antibiotics of vancomycin and Zosyn  Acute respiratory failure, COPD exacerbation: Acute.  Patient presents with significant respiratory distress requiring placement on 3 L nasal cannula oxygen. - Check ABG - Continuous pulse oximetry with nasal cannula oxygen as needed  - DuoNeb QID and Prn breath/wheezing - Brovana and budesonide nebs - Solu-Medrol 60 mg IV  q8hrs  Acute kidney injury: Patient's baseline creatinine previously noted to be 1. Patient presents with BUN 15 and creatinine1.38.  Suspect prerenal cause of symptoms. - Check FeUr - IV fluids as tolerated - Recheck BMP in a.m. - Hold nephrotoxic agents  Essential hypertension - Continue amlodipine - Held furosemide, spironolactone  Alcohol use: Patient's family reports that he drinks 2-3 beers a day on average. - CWIA protocols  Diabetes mellitus type 2: Previously prescribed metformin but not taking.  Initial blood glucose was noted to be within normal limits. - Hypoglycemic protocols - CBGs q. before meals and at bedtime with moderate SSI while on steroids   Tobacco abuse: Patient currently reports vaping. - Nicotine patch offered  DVT prophylaxis: Lovenox Code Status: Full Family Communication:  Discussed plan of care with the patient family present at bedside Disposition Plan: Likely discharge home once medically stable Consults called: None Admission status: Inpatient  Norval Morton MD [facility] Pager [phone]   If 7PM-7AM, please contact night-coverage [email or web address] Password [facility]  03/30/2018, 1:57 AM

## 2018-03-31 ENCOUNTER — Other Ambulatory Visit: Payer: Self-pay | Admitting: Family Medicine

## 2018-03-31 DIAGNOSIS — G9340 Encephalopathy, unspecified: Secondary | ICD-10-CM

## 2018-03-31 LAB — CBC WITH DIFFERENTIAL/PLATELET
Basophils Absolute: 0 10*3/uL (ref 0.0–0.1)
Basophils Relative: 0 %
EOS PCT: 0 %
Eosinophils Absolute: 0 10*3/uL (ref 0.0–0.7)
HCT: 40 % (ref 39.0–52.0)
Hemoglobin: 13.4 g/dL (ref 13.0–17.0)
LYMPHS ABS: 1.1 10*3/uL (ref 0.7–4.0)
LYMPHS PCT: 9 %
MCH: 31.9 pg (ref 26.0–34.0)
MCHC: 33.5 g/dL (ref 30.0–36.0)
MCV: 95.2 fL (ref 78.0–100.0)
MONO ABS: 0.4 10*3/uL (ref 0.1–1.0)
Monocytes Relative: 3 %
Neutro Abs: 11.6 10*3/uL — ABNORMAL HIGH (ref 1.7–7.7)
Neutrophils Relative %: 88 %
PLATELETS: 221 10*3/uL (ref 150–400)
RBC: 4.2 MIL/uL — ABNORMAL LOW (ref 4.22–5.81)
RDW: 13.5 % (ref 11.5–15.5)
WBC: 13.2 10*3/uL — ABNORMAL HIGH (ref 4.0–10.5)

## 2018-03-31 LAB — CREATININE, URINE, RANDOM: Creatinine, Urine: 95.03 mg/dL

## 2018-03-31 LAB — RESPIRATORY PANEL BY PCR
Adenovirus: NOT DETECTED
Bordetella pertussis: NOT DETECTED
CHLAMYDOPHILA PNEUMONIAE-RVPPCR: NOT DETECTED
Coronavirus 229E: NOT DETECTED
Coronavirus HKU1: NOT DETECTED
Coronavirus NL63: NOT DETECTED
Coronavirus OC43: DETECTED — AB
INFLUENZA A-RVPPCR: NOT DETECTED
INFLUENZA B-RVPPCR: NOT DETECTED
Metapneumovirus: NOT DETECTED
Mycoplasma pneumoniae: NOT DETECTED
PARAINFLUENZA VIRUS 4-RVPPCR: NOT DETECTED
Parainfluenza Virus 1: NOT DETECTED
Parainfluenza Virus 2: NOT DETECTED
Parainfluenza Virus 3: DETECTED — AB
RESPIRATORY SYNCYTIAL VIRUS-RVPPCR: NOT DETECTED
Rhinovirus / Enterovirus: NOT DETECTED

## 2018-03-31 LAB — GLUCOSE, CAPILLARY
GLUCOSE-CAPILLARY: 202 mg/dL — AB (ref 65–99)
GLUCOSE-CAPILLARY: 242 mg/dL — AB (ref 65–99)
Glucose-Capillary: 163 mg/dL — ABNORMAL HIGH (ref 65–99)
Glucose-Capillary: 223 mg/dL — ABNORMAL HIGH (ref 65–99)
Glucose-Capillary: 271 mg/dL — ABNORMAL HIGH (ref 65–99)

## 2018-03-31 LAB — HERPES SIMPLEX VIRUS(HSV) DNA BY PCR
HSV 1 DNA: NEGATIVE
HSV 2 DNA: NEGATIVE

## 2018-03-31 LAB — COMPREHENSIVE METABOLIC PANEL
ALBUMIN: 3.1 g/dL — AB (ref 3.5–5.0)
ALT: 27 U/L (ref 17–63)
AST: 22 U/L (ref 15–41)
Alkaline Phosphatase: 42 U/L (ref 38–126)
Anion gap: 9 (ref 5–15)
BUN: 15 mg/dL (ref 6–20)
CHLORIDE: 107 mmol/L (ref 101–111)
CO2: 22 mmol/L (ref 22–32)
Calcium: 8 mg/dL — ABNORMAL LOW (ref 8.9–10.3)
Creatinine, Ser: 0.95 mg/dL (ref 0.61–1.24)
GFR calc Af Amer: >60 mL/min (ref >60)
GFR calc non Af Amer: >60 mL/min (ref >60)
GLUCOSE: 193 mg/dL — AB (ref 65–99)
POTASSIUM: 3.7 mmol/L (ref 3.5–5.1)
Sodium: 138 mmol/L (ref 135–145)
Total Bilirubin: 0.5 mg/dL (ref 0.3–1.2)
Total Protein: 6.6 g/dL (ref 6.5–8.1)

## 2018-03-31 LAB — MAGNESIUM: Magnesium: 2.2 mg/dL (ref 1.7–2.4)

## 2018-03-31 LAB — LACTIC ACID, PLASMA: Lactic Acid, Venous: 1.3 mmol/L (ref 0.5–1.9)

## 2018-03-31 LAB — RPR: RPR Ser Ql: NONREACTIVE

## 2018-03-31 MED ORDER — INSULIN ASPART 100 UNIT/ML ~~LOC~~ SOLN
0.0000 [IU] | Freq: Three times a day (TID) | SUBCUTANEOUS | Status: DC
  Administered 2018-03-31: 5 [IU] via SUBCUTANEOUS
  Administered 2018-04-01 (×2): 1 [IU] via SUBCUTANEOUS
  Administered 2018-04-01: 2 [IU] via SUBCUTANEOUS

## 2018-03-31 MED ORDER — IPRATROPIUM-ALBUTEROL 0.5-2.5 (3) MG/3ML IN SOLN
3.0000 mL | Freq: Two times a day (BID) | RESPIRATORY_TRACT | Status: DC
  Administered 2018-03-31 – 2018-04-02 (×5): 3 mL via RESPIRATORY_TRACT
  Filled 2018-03-31 (×5): qty 3

## 2018-03-31 MED ORDER — INSULIN ASPART 100 UNIT/ML ~~LOC~~ SOLN
0.0000 [IU] | Freq: Every day | SUBCUTANEOUS | Status: DC
Start: 2018-03-31 — End: 2018-04-02
  Administered 2018-03-31: 2 [IU] via SUBCUTANEOUS

## 2018-03-31 MED ORDER — AZITHROMYCIN 250 MG PO TABS
500.0000 mg | ORAL_TABLET | Freq: Every day | ORAL | Status: DC
  Administered 2018-03-31: 500 mg via ORAL
  Filled 2018-03-31: qty 2

## 2018-03-31 NOTE — Procedures (Signed)
  ELECTROENCEPHALOGRAM REPORT  Date of Study: 03/31/18  Patient's Name: Juan Merritt MRN: [id number] Date of Birth: 01/24/1951  Referring Provider: Berle Mull, MD  Clinical History: CARSIN RANDAZZO is a 67 y.o. with PMH of HTN, COPD, morbid obesity, gout, alcohol, tobacco abuse; admitted on 03/29/2018, presented with complaint of shortness of breath and fever, was found to have acute encephalopathy.  Head CT negative.     Medications: Scheduled Meds: . arformoterol  15 mcg Nebulization BID  . budesonide (PULMICORT) nebulizer solution  0.5 mg Nebulization BID  . insulin aspart  0-9 Units Subcutaneous Q4H  . ipratropium-albuterol  3 mL Nebulization BID  . LORazepam  0-4 mg Intravenous Q6H   Followed by  . [START ON 04/01/2018] LORazepam  0-4 mg Intravenous Q12H  . methylPREDNISolone (SOLU-MEDROL) injection  60 mg Intravenous Q8H  . nicotine  21 mg Transdermal Daily  . thiamine  100 mg Oral Daily   Or  . thiamine  100 mg Intravenous Daily   Continuous Infusions: . piperacillin-tazobactam (ZOSYN)  IV 3.375 g (03/30/18 2136)  . vancomycin Stopped (03/30/18 2337)   PRN Meds:.acetaminophen **OR** acetaminophen, haloperidol lactate, ipratropium-albuterol, LORazepam **OR** LORazepam, ondansetron **OR** ondansetron (ZOFRAN) IV            Technical Summary: This is a standard 16 channel EEG recording performed according to the international 10-20 electrode system.  AP bipolar, transverse bipolar, and referential montages were obtained, and digitally reformatted as necessary.  Duration of tracing: 21:39  Description: Pt is described as being lethargic after receiving Ativan for agitation.  Background consists of mildly disorganized 4 Hz predominant posterior rhythym.  Spindles are noted.  No definite well defined drowsiness or stage II sleep was identified.  Pt is stimulated later in the tracing during which time he is briefly awake with persistent low amplitude 4-6Hz  slowing.   Neither hyperventilation or photic stimulation were performed.  EKG was monitored and noted to be sinus tachycardia with an average heart rate of 102 bpm.  No epileptiform changes were noted.  Impression: This is an abnormal EEG due to background slowing seen throughout the tracing.  Spindles are noted- likely due to benzodiazepine use.  This slowing is a  non-specific finding that can be seen with toxic, metabolic, diffuse, or multifocal structural processes.  No definite epileptiform changes were noted.   A single EEG without epileptiform changes does not exclude the diagnosis of epilepsy. Clinical correlation advised.   Carvel Getting, M.D. Neurology Cell [phone]

## 2018-03-31 NOTE — Progress Notes (Signed)
[facility] Progress Note  Patient: Juan Merritt GGE:[id number]   PCP: [name], MD DOB: [birth year]   DOA: 03/29/2018   DOS: 03/31/2018   Date of Service: the patient was seen and examined on 03/31/2018  Subjective: No acute complaint no nausea no vomiting.  Patient is actually awake this morning and is able to communicate.  Per wife mentation wise patient is back to his baseline.  Patient continues to have some shortness of breath as well as cough.  Fever since yesterday.  Brief hospital course: Pt. with PMH of HTN, COPD, morbid obesity, gout, alcohol, tobacco abuse; admitted on 03/29/2018, presented with complaint of shortness of breath and fever, was found to have acute encephalopathy. Currently further plan is current treatment.  Assessment and Plan: 1.  Severe sepsis. Acute metabolic encephalopathy. Sinusitis. Temperature 103.4.  Tachycardia.  Tachypnea. Unable to follow any commands right now.  Withdraws to painful stimuli. Chest x-ray unremarkable. Urinalysis unremarkable. Influenza PCR negative. Lactic acid negative. Pro-calcitonin level normal. Urine culture currently pending. CT of the head shows sinusitis. no evidence of cellulitis on exam. With encephalopathy as well as fever without any acute etiology LP was performed. Patient tolerated very well. Shows no evidence of significant leukocytosis. Do not think that the patient has meningitis and requires higher antibiotic coverage. MRSA PCR negative, discontinue IV vancomycin. Respiratory virus panel still pending. We will continue IV Zosyn for now.  Blood culture negative for 48 hours.  2.  Acute hypoxic hypercarbic respiratory failure. Metabolic encephalopathy acute. Mentation progressively worsening in hospital. ABG initially on admission was unremarkable but now shows acute hypercarbic respiratory failure. This is likely progression of his metabolic encephalopathy due to sepsis as well as a combination  of lorazepam causing encephalopathy. If any improvement with BiPAP last night. Currently does not require BiPAP anymore. We will continue to monitor. May have sleep apnea which is undiagnosed.  3.?  Alcohol abuse. Family reports 2-3 beers on a daily basis. Last beer just yesterday. Do not think the patient is actually having withdrawal or delirium right now. We will monitor.  4.  Essential hypertension. Blood pressure currently stable. Monitor.  5.  Type 2 diabetes mellitus. Check hemoglobin A1c. Changing him to sliding scale  Diet: Cardiac and heart healthy diet. DVT Prophylaxis: subcutaneous Heparin  Advance goals of care discussion: full code  Family Communication: family was present at bedside, at the time of interview. The pt provided permission to discuss medical plan with the family. Opportunity was given to ask question and all questions were answered satisfactorily.   Disposition:  Discharge to home.  Consultants: IR Procedures: LP  Antibiotics: Anti-infectives (From admission, onward)   Start     Dose/Rate Route Frequency Ordered Stop   03/31/18 1030  azithromycin (ZITHROMAX) tablet 500 mg     500 mg Oral Daily 03/31/18 1013     03/30/18 2200  vancomycin (VANCOCIN) 1,500 mg in sodium chloride 0.9 % 500 mL IVPB  Status:  Discontinued     1,500 mg 250 mL/hr over 120 Minutes Intravenous Every 24 hours 03/30/18 0400 03/30/18 1030   03/30/18 2200  piperacillin-tazobactam (ZOSYN) IVPB 3.375 g     3.375 g 12.5 mL/hr over 240 Minutes Intravenous Every 8 hours 03/30/18 2101     03/30/18 2200  vancomycin (VANCOCIN) 1,500 mg in sodium chloride 0.9 % 500 mL IVPB  Status:  Discontinued     1,500 mg 250 mL/hr over 120 Minutes Intravenous Every 24 hours 03/30/18 2101 03/31/18 1012  03/30/18 0600  piperacillin-tazobactam (ZOSYN) IVPB 3.375 g  Status:  Discontinued     3.375 g 12.5 mL/hr over 240 Minutes Intravenous Every 8 hours 03/30/18 0358 03/30/18 1030   03/29/18 2330   piperacillin-tazobactam (ZOSYN) IVPB 3.375 g     3.375 g 100 mL/hr over 30 Minutes Intravenous  Once 03/29/18 2316 03/30/18 0036   03/29/18 2330  vancomycin (VANCOCIN) IVPB 1000 mg/200 mL premix  Status:  Discontinued     1,000 mg 200 mL/hr over 60 Minutes Intravenous  Once 03/29/18 2316 03/29/18 2326   03/29/18 2330  vancomycin (VANCOCIN) 2,000 mg in sodium chloride 0.9 % 500 mL IVPB     2,000 mg 250 mL/hr over 120 Minutes Intravenous  Once 03/29/18 2327 03/30/18 0230       Objective: Physical Exam: Vitals:   03/31/18 0343 03/31/18 0723 03/31/18 0831 03/31/18 1202  BP: 112/77 111/76  116/78  Pulse: 85 91 90 97  Resp: 20 19 20  (!) 22  Temp: 98 F (36.7 C) 98.1 F (36.7 C)  98 F (36.7 C)  TempSrc: Oral Oral  Oral  SpO2: 96% 90% 93% 93%  Weight:      Height:        Intake/Output Summary (Last 24 hours) at 03/31/2018 1556 Last data filed at 03/31/2018 0136 Gross per 24 hour  Intake 910 ml  Output 925 ml  Net -15 ml   Filed Weights   03/29/18 2230  Weight: 133.4 kg (294 lb)   General: Drowsy lethargic, unable to follow any commands, Appear in marked distress, affect difficult to assess Eyes: PERRL, Conjunctiva normal ENT: Oral Mucosa clear moist. Neck: difficult to assess JVD, no Abnormal Mass Or lumps Cardiovascular: S1 and S2 Present, no Murmur, Peripheral Pulses Present Respiratory: increased respiratory effort, Bilateral Air entry equal and Decreased, no use of accessory muscle, Clear to Auscultation, no Crackles, no wheezes Abdomen: Bowel Sound present, Soft and no tenderness, no hernia Skin: no redness, no Rash, no induration Extremities: trace Pedal edema, no calf tenderness Neurologic: Bilateral reflexes present. Babinski equivocal. Withdraws to painful stimuli.   Data Reviewed: CBC: Recent Labs  Lab 03/29/18 2227 03/30/18 0356 03/31/18 0407  WBC 12.1* 10.8* 13.2*  NEUTROABS 9.7*  --  11.6*  HGB 14.1 12.9* 13.4  HCT 42.4 39.5 40.0  MCV 97.0 97.5  95.2  PLT 249 212 951   Basic Metabolic Panel: Recent Labs  Lab 03/29/18 2227 03/30/18 0356 03/31/18 0407  NA 132* 135 138  K 4.1 3.6 3.7  CL 95* 103 107  CO2 26 21* 22  GLUCOSE 107* 114* 193*  BUN 15 16 15   CREATININE 1.38* 1.31* 0.95  CALCIUM 9.0 7.8* 8.0*  MG  --   --  2.2    Liver Function Tests: Recent Labs  Lab 03/29/18 2227 03/31/18 0407  AST 23 22  ALT 33 27  ALKPHOS 50 42  BILITOT 0.7 0.5  PROT 7.4 6.6  ALBUMIN 3.9 3.1*   No results for input(s): LIPASE, AMYLASE in the last 168 hours. Recent Labs  Lab 03/30/18 1032  AMMONIA 36*   Coagulation Profile: Recent Labs  Lab 03/29/18 2227  INR 1.12   Cardiac Enzymes: No results for input(s): CKTOTAL, CKMB, CKMBINDEX, TROPONINI in the last 168 hours. BNP (last 3 results) No results for input(s): PROBNP in the last 8760 hours. CBG: Recent Labs  Lab 03/30/18 2021 03/30/18 2346 03/31/18 0338 03/31/18 0727 03/31/18 1200  GLUCAP 170* 253* 202* 163* 223*   Studies: No results  found.  Scheduled Meds: . arformoterol  15 mcg Nebulization BID  . azithromycin  500 mg Oral Daily  . budesonide (PULMICORT) nebulizer solution  0.5 mg Nebulization BID  . insulin aspart  0-5 Units Subcutaneous QHS  . insulin aspart  0-9 Units Subcutaneous TID WC  . ipratropium-albuterol  3 mL Nebulization BID  . LORazepam  0-4 mg Intravenous Q6H   Followed by  . [START ON 04/01/2018] LORazepam  0-4 mg Intravenous Q12H  . methylPREDNISolone (SOLU-MEDROL) injection  60 mg Intravenous Q8H  . nicotine  21 mg Transdermal Daily  . thiamine  100 mg Oral Daily   Or  . thiamine  100 mg Intravenous Daily   Continuous Infusions: . piperacillin-tazobactam (ZOSYN)  IV 3.375 g (03/31/18 1422)   PRN Meds: acetaminophen **OR** acetaminophen, haloperidol lactate, ipratropium-albuterol, LORazepam **OR** LORazepam, ondansetron **OR** ondansetron (ZOFRAN) IV  Time spent: 35 minutes  Author: Berle Mull, MD [facility] Pager:  [phone] 03/31/2018 3:56 PM  If 7PM-7AM, please contact night-coverage at [email or web address], password [facility]

## 2018-04-01 LAB — BASIC METABOLIC PANEL
ANION GAP: 10 (ref 5–15)
BUN: 15 mg/dL (ref 6–20)
CALCIUM: 8.4 mg/dL — AB (ref 8.9–10.3)
CO2: 23 mmol/L (ref 22–32)
Chloride: 106 mmol/L (ref 101–111)
Creatinine, Ser: 0.99 mg/dL (ref 0.61–1.24)
GFR calc Af Amer: >60 mL/min (ref >60)
Glucose, Bld: 212 mg/dL — ABNORMAL HIGH (ref 65–99)
POTASSIUM: 3.9 mmol/L (ref 3.5–5.1)
SODIUM: 139 mmol/L (ref 135–145)

## 2018-04-01 LAB — GLUCOSE, CAPILLARY
GLUCOSE-CAPILLARY: 142 mg/dL — AB (ref 65–99)
GLUCOSE-CAPILLARY: 155 mg/dL — AB (ref 65–99)
GLUCOSE-CAPILLARY: 182 mg/dL — AB (ref 65–99)
Glucose-Capillary: 148 mg/dL — ABNORMAL HIGH (ref 65–99)
Glucose-Capillary: 132 mg/dL — ABNORMAL HIGH (ref 65–99)

## 2018-04-01 LAB — CBC
HEMATOCRIT: 39.2 % (ref 39.0–52.0)
HEMOGLOBIN: 13 g/dL (ref 13.0–17.0)
MCH: 31.6 pg (ref 26.0–34.0)
MCHC: 33.2 g/dL (ref 30.0–36.0)
MCV: 95.4 fL (ref 78.0–100.0)
Platelets: 237 10*3/uL (ref 150–400)
RBC: 4.11 MIL/uL — ABNORMAL LOW (ref 4.22–5.81)
RDW: 13.7 % (ref 11.5–15.5)
WBC: 15 10*3/uL — AB (ref 4.0–10.5)

## 2018-04-01 LAB — HEMOGLOBIN A1C
Hgb A1c MFr Bld: 6.1 % — ABNORMAL HIGH (ref 4.8–5.6)
MEAN PLASMA GLUCOSE: 128.37 mg/dL

## 2018-04-01 LAB — UREA NITROGEN, URINE: UREA NITROGEN UR: 383 mg/dL

## 2018-04-01 MED ORDER — PREDNISONE 20 MG PO TABS
50.0000 mg | ORAL_TABLET | Freq: Every day | ORAL | Status: DC
  Administered 2018-04-02: 50 mg via ORAL
  Filled 2018-04-01: qty 2

## 2018-04-01 MED ORDER — GUAIFENESIN ER 600 MG PO TB12
600.0000 mg | ORAL_TABLET | Freq: Two times a day (BID) | ORAL | Status: DC
  Administered 2018-04-01 – 2018-04-02 (×3): 600 mg via ORAL
  Filled 2018-04-01 (×3): qty 1

## 2018-04-01 NOTE — Plan of Care (Signed)
Pt ambulatory without SOB.  Lungs clear, diminished.  Appetite good.

## 2018-04-01 NOTE — Progress Notes (Signed)
[facility] Progress Note  Patient: Juan Merritt DGL:[id number]   PCP: [name], MD DOB: Jun 05, 1951   DOA: 03/29/2018   DOS: 04/01/2018   Date of Service: the patient was seen and examined on 04/01/2018  Subjective: Feeling better, breathing better.  No nausea no vomiting no chest pain.  Has loose BM which is also getting better.  No burning urination.  Brief hospital course: Pt. with PMH of HTN, COPD, morbid obesity, gout, alcohol, tobacco abuse; admitted on 03/29/2018, presented with complaint of shortness of breath and fever, was found to have acute encephalopathy. Currently further plan is current treatment.  Assessment and Plan: 1.  Severe sepsis secondary to coronavirus as well as parainfluenza virus infection Acute metabolic encephalopathy. Sinusitis. Temperature 103.4.  Tachycardia.  Tachypnea. Unable to follow any commands on admission, withdraws to painful stimuli.  Now back to baseline mentation wise. Chest x-ray unremarkable. Urinalysis unremarkable. Influenza PCR negative.  Respiratory virus pathogen panel is positive for coronavirus as well as parainfluenza virus. Lactic acid negative. Pro-calcitonin level unremarkable. CT of the head shows sinusitis. With encephalopathy as well as fever without any acute etiology LP was performed. Based on the analysis, do not think that the patient has meningitis MRSA PCR negative, discontinue IV vancomycin.  2.  Acute hypoxic hypercarbic respiratory failure. Metabolic encephalopathy acute. ABG initially on admission was unremarkable but now shows acute hypercarbic respiratory failure. This is likely progression of his metabolic encephalopathy due to sepsis as well as a combination of lorazepam causing encephalopathy. Initially required BiPAP in stepdown, currently on room air. We will continue to monitor. May have sleep apnea which is undiagnosed.  3.?  Alcohol abuse. Family reports 2-3 beers on a daily basis. Do not  think the patient is actually having withdrawal or delirium right now. We will monitor.  4.  Essential hypertension. Blood pressure currently stable. Monitor.  5.  Type 2 diabetes mellitus. Hemoglobin A1c 6.1. Continue to sliding scale  Diet: Cardiac and carb modified diet. DVT Prophylaxis: subcutaneous Heparin  Advance goals of care discussion: full code  Family Communication: family was present at bedside, at the time of interview. The pt provided permission to discuss medical plan with the family. Opportunity was given to ask question and all questions were answered satisfactorily.   Disposition:  Discharge to home.  Consultants: IR Procedures: LP  Antibiotics: Anti-infectives (From admission, onward)   Start     Dose/Rate Route Frequency Ordered Stop   03/31/18 1030  azithromycin (ZITHROMAX) tablet 500 mg  Status:  Discontinued     500 mg Oral Daily 03/31/18 1013 04/01/18 0828   03/30/18 2200  vancomycin (VANCOCIN) 1,500 mg in sodium chloride 0.9 % 500 mL IVPB  Status:  Discontinued     1,500 mg 250 mL/hr over 120 Minutes Intravenous Every 24 hours 03/30/18 0400 03/30/18 1030   03/30/18 2200  piperacillin-tazobactam (ZOSYN) IVPB 3.375 g  Status:  Discontinued     3.375 g 12.5 mL/hr over 240 Minutes Intravenous Every 8 hours 03/30/18 2101 04/01/18 0828   03/30/18 2200  vancomycin (VANCOCIN) 1,500 mg in sodium chloride 0.9 % 500 mL IVPB  Status:  Discontinued     1,500 mg 250 mL/hr over 120 Minutes Intravenous Every 24 hours 03/30/18 2101 03/31/18 1012   03/30/18 0600  piperacillin-tazobactam (ZOSYN) IVPB 3.375 g  Status:  Discontinued     3.375 g 12.5 mL/hr over 240 Minutes Intravenous Every 8 hours 03/30/18 0358 03/30/18 1030   03/29/18 2330  piperacillin-tazobactam (ZOSYN) IVPB  3.375 g     3.375 g 100 mL/hr over 30 Minutes Intravenous  Once 03/29/18 2316 03/30/18 0036   03/29/18 2330  vancomycin (VANCOCIN) IVPB 1000 mg/200 mL premix  Status:  Discontinued     1,000  mg 200 mL/hr over 60 Minutes Intravenous  Once 03/29/18 2316 03/29/18 2326   03/29/18 2330  vancomycin (VANCOCIN) 2,000 mg in sodium chloride 0.9 % 500 mL IVPB     2,000 mg 250 mL/hr over 120 Minutes Intravenous  Once 03/29/18 2327 03/30/18 0230       Objective: Physical Exam: Vitals:   04/01/18 0457 04/01/18 0928 04/01/18 0943 04/01/18 1556  BP: 105/71 110/82  (!) 145/99  Pulse:  72  80  Resp:  16  18  Temp: (!) 97.5 F (36.4 C) 97.8 F (36.6 C)  97.6 F (36.4 C)  TempSrc: Oral Oral  Oral  SpO2: 92% 93% 93% 95%  Weight:      Height:        Intake/Output Summary (Last 24 hours) at 04/01/2018 1648 Last data filed at 04/01/2018 1200 Gross per 24 hour  Intake 400 ml  Output 400 ml  Net 0 ml   Filed Weights   03/29/18 2230  Weight: 133.4 kg (294 lb)   General: Alert awake and oriented x3, appears in mild distress.  Eyes: PERRL, Conjunctiva normal ENT: Oral Mucosa clear moist. Neck: difficult to assess JVD, no Abnormal Mass Or lumps Cardiovascular: S1 and S2 Present, no Murmur, Peripheral Pulses Present Respiratory: normal respiratory effort, Bilateral Air entry equal and Decreased, no use of accessory muscle, Clear to Auscultation, no Crackles, no wheezes Abdomen: Bowel Sound present, Soft and no tenderness, no hernia Skin: no redness, no Rash, no induration Extremities: trace Pedal edema, no calf tenderness Neurologic: No focal deficit on examination, bilateral equal strength, equal sensation.  Data Reviewed: CBC: Recent Labs  Lab 03/29/18 2227 03/30/18 0356 03/31/18 0407 04/01/18 0220  WBC 12.1* 10.8* 13.2* 15.0*  NEUTROABS 9.7*  --  11.6*  --   HGB 14.1 12.9* 13.4 13.0  HCT 42.4 39.5 40.0 39.2  MCV 97.0 97.5 95.2 95.4  PLT 249 212 221 176   Basic Metabolic Panel: Recent Labs  Lab 03/29/18 2227 03/30/18 0356 03/31/18 0407 04/01/18 0220  NA 132* 135 138 139  K 4.1 3.6 3.7 3.9  CL 95* 103 107 106  CO2 26 21* 22 23  GLUCOSE 107* 114* 193* 212*  BUN 15  16 15 15   CREATININE 1.38* 1.31* 0.95 0.99  CALCIUM 9.0 7.8* 8.0* 8.4*  MG  --   --  2.2  --     Liver Function Tests: Recent Labs  Lab 03/29/18 2227 03/31/18 0407  AST 23 22  ALT 33 27  ALKPHOS 50 42  BILITOT 0.7 0.5  PROT 7.4 6.6  ALBUMIN 3.9 3.1*   No results for input(s): LIPASE, AMYLASE in the last 168 hours. Recent Labs  Lab 03/30/18 1032  AMMONIA 36*   Coagulation Profile: Recent Labs  Lab 03/29/18 2227  INR 1.12   Cardiac Enzymes: No results for input(s): CKTOTAL, CKMB, CKMBINDEX, TROPONINI in the last 168 hours. BNP (last 3 results) No results for input(s): PROBNP in the last 8760 hours. CBG: Recent Labs  Lab 03/31/18 1649 03/31/18 2045 04/01/18 0800 04/01/18 1220 04/01/18 1552  GLUCAP 271* 242* 142* 155* 182*   Studies: No results found.  Scheduled Meds: . arformoterol  15 mcg Nebulization BID  . budesonide (PULMICORT) nebulizer solution  0.5 mg  Nebulization BID  . guaiFENesin  600 mg Oral BID  . insulin aspart  0-5 Units Subcutaneous QHS  . insulin aspart  0-9 Units Subcutaneous TID WC  . ipratropium-albuterol  3 mL Nebulization BID  . nicotine  21 mg Transdermal Daily  . [START ON 04/02/2018] predniSONE  50 mg Oral Q breakfast  . thiamine  100 mg Oral Daily   Or  . thiamine  100 mg Intravenous Daily   Continuous Infusions:  PRN Meds: acetaminophen **OR** acetaminophen, ipratropium-albuterol, LORazepam **OR** LORazepam, ondansetron **OR** ondansetron (ZOFRAN) IV  Time spent: 35 minutes  Author: Berle Mull, MD [facility] Pager: [phone] 04/01/2018 4:48 PM  If 7PM-7AM, please contact night-coverage at [email or web address], password [facility]

## 2018-04-02 LAB — CBC
HEMATOCRIT: 38.4 % — AB (ref 39.0–52.0)
Hemoglobin: 12.7 g/dL — ABNORMAL LOW (ref 13.0–17.0)
MCH: 32.3 pg (ref 26.0–34.0)
MCHC: 33.1 g/dL (ref 30.0–36.0)
MCV: 97.7 fL (ref 78.0–100.0)
Platelets: 238 10*3/uL (ref 150–400)
RBC: 3.93 MIL/uL — ABNORMAL LOW (ref 4.22–5.81)
RDW: 14.3 % (ref 11.5–15.5)
WBC: 12.5 10*3/uL — ABNORMAL HIGH (ref 4.0–10.5)

## 2018-04-02 LAB — BASIC METABOLIC PANEL
Anion gap: 9 (ref 5–15)
BUN: 19 mg/dL (ref 6–20)
CHLORIDE: 107 mmol/L (ref 101–111)
CO2: 26 mmol/L (ref 22–32)
Calcium: 8.3 mg/dL — ABNORMAL LOW (ref 8.9–10.3)
Creatinine, Ser: 0.98 mg/dL (ref 0.61–1.24)
GFR calc Af Amer: >60 mL/min (ref >60)
Glucose, Bld: 110 mg/dL — ABNORMAL HIGH (ref 65–99)
POTASSIUM: 3.5 mmol/L (ref 3.5–5.1)
Sodium: 142 mmol/L (ref 135–145)

## 2018-04-02 LAB — CSF CULTURE W GRAM STAIN: Culture: NO GROWTH

## 2018-04-02 LAB — GLUCOSE, CAPILLARY
Glucose-Capillary: 136 mg/dL — ABNORMAL HIGH (ref 65–99)
Glucose-Capillary: 107 mg/dL — ABNORMAL HIGH (ref 65–99)

## 2018-04-02 LAB — CSF CULTURE

## 2018-04-02 MED ORDER — NICOTINE 21 MG/24HR TD PT24: 21.0000 mg | MEDICATED_PATCH | Freq: Every day | TRANSDERMAL | 0 refills | Status: DC

## 2018-04-02 MED ORDER — THIAMINE HCL 100 MG PO TABS: 100.0000 mg | ORAL_TABLET | Freq: Every day | ORAL | 0 refills | Status: DC

## 2018-04-02 MED ORDER — POTASSIUM CHLORIDE CRYS ER 20 MEQ PO TBCR
20.0000 meq | EXTENDED_RELEASE_TABLET | Freq: Once | ORAL | Status: AC
  Administered 2018-04-02: 20 meq via ORAL
  Filled 2018-04-02: qty 1

## 2018-04-02 MED ORDER — ALBUTEROL SULFATE (2.5 MG/3ML) 0.083% IN NEBU: 2.5000 mg | INHALATION_SOLUTION | Freq: Four times a day (QID) | RESPIRATORY_TRACT | 0 refills | Status: DC | PRN

## 2018-04-02 MED ORDER — FUROSEMIDE 40 MG PO TABS
40.0000 mg | ORAL_TABLET | Freq: Two times a day (BID) | ORAL | Status: DC
  Administered 2018-04-02: 40 mg via ORAL
  Filled 2018-04-02: qty 1

## 2018-04-02 MED ORDER — PREDNISONE 10 MG PO TABS: ORAL_TABLET | ORAL | 0 refills | Status: DC

## 2018-04-02 MED ORDER — GUAIFENESIN ER 600 MG PO TB12: 600.0000 mg | ORAL_TABLET | Freq: Two times a day (BID) | ORAL | 0 refills | Status: DC

## 2018-04-02 NOTE — Care Management Note (Signed)
Case Management Note  Patient Details  Name: Juan Merritt MRN: [id number] Date of Birth: [birth year]  Subjective/Objective:    Metabolic encephalopathy, Severe Sepsis s/t coronavirus, parainfluenza                Action/Plan: NCM spoke to pt's wife and he does have a neb machine at home. No NCM needs identified.   Expected Discharge Date:  04/02/18               Expected Discharge Plan:  Home/Self Care  In-House Referral:  NA  Discharge planning Services  CM Consult  Post Acute Care Choice:  NA Choice offered to:  NA  DME Arranged:  N/A DME Agency:  NA  HH Arranged:  NA HH Agency:  NA  Status of Service:  Completed, signed off  If discussed at [place] of Stay Meetings, dates discussed:    Additional Comments:  Erenest Rasher, RN 04/02/2018, 8:45 AM

## 2018-04-03 LAB — CULTURE, BLOOD (ROUTINE X 2)
Culture: NO GROWTH
Culture: NO GROWTH
SPECIAL REQUESTS: ADEQUATE
Special Requests: ADEQUATE

## 2018-04-04 NOTE — Discharge Summary (Signed)
[facility] Discharge Summary   Patient: Juan Merritt FYB:[id number]   PCP: [name], MD DOB: 1951-02-04   Date of admission: 03/29/2018   Date of discharge: 04/02/2018     Discharge Diagnoses:  Principal Problem:   Sepsis ([place]) Active Problems:   Acute respiratory failure ([place])   COPD with acute exacerbation ([place])   Essential hypertension, benign   AKI (acute kidney injury) ([place])   Admitted From: home Disposition:  home  Recommendations for Outpatient Follow-up:  1. Please follow-up with PCP in 1-2 weeks.  Follow-up Information    Stacks, Cletus Gash, MD. Schedule an appointment as soon as possible for a visit in 1 week(s).   Specialty:  Family Medicine Contact information: [address] [place] [address] [phone]          Diet recommendation: cardiac diet  Activity: The patient is advised to gradually reintroduce usual activities.  Discharge Condition: good  Code Status: full code  History of present illness: As per the H and P dictated on admission, " Juan Merritt is a 67 y.o. male with medical history significant of HTN, COPD not on home oxygen,, morbid obesity, gout, alcohol use, and tobacco abuse; who reports with complaints of shortness of breath and fever.  Over the last few days patient had been developing sinus congestion with a productive cough and wheezing.  Yesterday, patient's wife noted that he appeared significantly ill and was disoriented.  She notes that he had been leaning over in the kitchen and appeared ashen gray color, and was witnessed to lose consciousness falling backwards.  She notes that he had lost consciousness only for a short period in time and she ran to called EMS.  She had questioned if he had had a stroke or seizure.  His wife checked his temperature and noted it was 101.2 F.  She had given him 324 mg of aspirin and some Motrin to treat symptoms.  He complained of pain in his shoulder and leg following a fall.   Patient normally drinks 2-3 beers a day on average and smokes nicotine with a vaping device.  Denies any recent sick contacts to her knowledge, trauma does say, loss of bowel/bladder, chest pain, nausea, vomiting, or diarrhea"  Hospital Course:  Summary of his active problems in the hospital is as following. 1.  Severe sepsis secondary to coronavirus as well as parainfluenza virus infection Acute metabolic encephalopathy. Sinusitis. Temperature 103.4.  Tachycardia.  Tachypnea. Unable to follow any commands on admission, withdraws to painful stimuli.  Now back to baseline mentation wise. Chest x-ray unremarkable. Urinalysis unremarkable. Influenza PCR negative.  Respiratory virus pathogen panel is positive for coronavirus as well as parainfluenza virus. Lactic acid negative. Pro-calcitonin level unremarkable. CT of the head shows sinusitis. With encephalopathy as well as fever without any acute etiology LP was performed. Based on the analysis, do not think that the patient has meningitis MRSA PCR negative, antibiotics were discontinued. Continue prednisone taper as well as nebulizer on discharge at home.  2.  Acute hypoxic hypercarbic respiratory failure. Metabolic encephalopathy acute. ABG initially on admission was unremarkable but now shows acute hypercarbic respiratory failure. This is likely progression of his metabolic encephalopathy due to sepsis as well as a combination of lorazepam causing encephalopathy. Initially required BiPAP in stepdown, currently on room air. May have sleep apnea which is undiagnosed.  Will need outpatient workup. EEG as well as lumbar puncture unremarkable.  3.?  Alcohol abuse. Family reports 2-3 beers on a daily  basis. Do not think the patient is actually having withdrawal or delirium right now.  4.  Essential hypertension. Blood pressure currently stable. Monitor.  5.  Type 2 diabetes mellitus. Hemoglobin A1c 6.1. Continue  home regimen.  6.   Chronic diastolic CHF. Louie [place] in 2016 shows 60-65% EF patient on Lasix.  Will resume on discharge.  All other chronic medical condition were stable during the hospitalization.  Patient was ambulatory without any assistance. On the day of the discharge the patient's vitals were stable , and no other acute medical condition were reported by patient. the patient was felt safe to be discharge at home with family.  Procedures and Results:  LP  EEG   Consultations:  none  DISCHARGE MEDICATION: Allergies as of 04/02/2018   No Known Allergies     Medication List    TAKE these medications   albuterol 108 (90 Base) MCG/ACT inhaler Commonly known as:  PROVENTIL HFA;VENTOLIN HFA Inhale 2 puffs into the lungs 2 (two) times daily.   albuterol (2.5 MG/3ML) 0.083% nebulizer solution Commonly known as:  PROVENTIL Take 3 mLs (2.5 mg total) by nebulization every 6 (six) hours as needed for wheezing or shortness of breath.   allopurinol 100 MG tablet Commonly known as:  ZYLOPRIM TAKE 1 TABLET (100 MG TOTAL) BY MOUTH DAILY. TO PREVENT GOUT   amLODipine 10 MG tablet Commonly known as:  NORVASC TAKE 1 TABLET (10 MG TOTAL) BY MOUTH DAILY.   budesonide-formoterol 160-4.5 MCG/ACT inhaler Commonly known as:  SYMBICORT Inhale 2 puffs 2 (two) times daily into the lungs.   furosemide 40 MG tablet Commonly known as:  LASIX TAKE 1 TABLET (40 MG TOTAL) BY MOUTH 2 (TWO) TIMES DAILY. ONE AT BREAKFAST AND LUNCH What changed:    how much to take  how to take this  when to take this  additional instructions   guaiFENesin 600 MG 12 hr tablet Commonly known as:  MUCINEX Take 1 tablet (600 mg total) by mouth 2 (two) times daily.   losartan 100 MG tablet Commonly known as:  COZAAR TAKE 1 TABLET BY MOUTH EVERY DAY   meloxicam 15 MG tablet Commonly known as:  MOBIC Take 1 tablet (15 mg total) by mouth daily.   nicotine 21 mg/24hr patch Commonly known as:  NICODERM CQ - dosed in  mg/24 hours Place 1 patch (21 mg total) onto the skin daily.   potassium chloride SA 20 MEQ tablet Commonly known as:  KLOR-CON M20 Take 1 tablet (20 mEq total) daily by mouth.   predniSONE 10 MG tablet Commonly known as:  DELTASONE Take 40mg  daily for 3days,Take 30mg  daily for 3days,Take 20mg  daily for 3days,Take 10mg  daily for 3days, then stop   spironolactone 25 MG tablet Commonly known as:  ALDACTONE Take 1 tablet (25 mg total) daily by mouth.   thiamine 100 MG tablet Take 1 tablet (100 mg total) by mouth daily.   traMADol 50 MG tablet Commonly known as:  ULTRAM Take 50-100 mg by mouth every 6 (six) hours as needed for moderate pain.      No Known Allergies Discharge Instructions    Diet - low sodium heart healthy   Complete by:  As directed    Discharge instructions   Complete by:  As directed    It is important that you read following instructions as well as go over your medication list with RN to help you understand your care after this hospitalization.  Discharge Instructions: Please follow-up with PCP  in one week  Please request your primary care physician to go over all Hospital Tests and Procedure/Radiological results at the follow up,  Please get all Hospital records sent to your PCP by signing hospital release before you go home.   Do not take more than prescribed Pain, Sleep and Anxiety Medications. You were cared for by a hospitalist during your hospital stay. If you have any questions about your discharge medications or the care you received while you were in the hospital after you are discharged, you can call the unit and ask to speak with the hospitalist on call if the hospitalist that took care of you is not available.  Once you are discharged, your primary care physician will handle any further medical issues. Please note that NO REFILLS for any discharge medications will be authorized once you are discharged, as it is imperative that you return to your  primary care physician (or establish a relationship with a primary care physician if you do not have one) for your aftercare needs so that they can reassess your need for medications and monitor your lab values. You Must read complete instructions/literature along with all the possible adverse reactions/side effects for all the Medicines you take and that have been prescribed to you. Take any new Medicines after you have completely understood and accept all the possible adverse reactions/side effects. Wear Seat belts while driving. If you have smoked or chewed Tobacco in the last 2 yrs please stop smoking and/or stop any Recreational drug use.   Increase activity slowly   Complete by:  As directed      Discharge Exam: Filed Weights   03/29/18 2230  Weight: 133.4 kg (294 lb)   Vitals:   04/02/18 0732 04/02/18 0908  BP: 112/78   Pulse: 93   Resp: 20   Temp: 98.8 F (37.1 C)   SpO2: 92% 92%   General: Appear in no distress, no Rash; Oral Mucosa moist. Cardiovascular: S1 and S2 Present, no Murmur, no JVD Respiratory: Bilateral Air entry present and no Crackles, Occasional  wheezes Abdomen: Bowel Sound present, Soft and no tenderness Extremities: no Pedal edema, no calf tenderness Neurology: Grossly no focal neuro deficit.  The results of significant diagnostics from this hospitalization (including imaging, microbiology, ancillary and laboratory) are listed below for reference.    Significant Diagnostic Studies: Ct Head Wo Contrast  Result Date: 03/30/2018 CLINICAL DATA:  New onset confusion. Fever. Recent fall. Initial encounter. EXAM: CT HEAD WITHOUT CONTRAST TECHNIQUE: Contiguous axial images were obtained from the base of the skull through the vertex without intravenous contrast. COMPARISON:  None. FINDINGS: Brain: There is no evidence of acute infarct, intracranial hemorrhage, mass, midline shift, or extra-axial fluid collection. The ventricles and sulci are normal in size. A cavum  septum pellucidum et vergae is incidentally noted. Periventricular white matter hypodensities are nonspecific but compatible with minimal chronic small vessel ischemic disease. Vascular: Mild calcified atherosclerosis in the carotid siphons. No hyperdense vessel. Skull: No fracture or focal osseous lesion. Sinuses/Orbits: Prominent circumferential mucosal thickening and bubbly secretions in the maxillary sinuses. Moderate right and mild left ethmoid air cell and mild frontal sinus mucosal thickening bilaterally. Clear mastoid air cells. Unremarkable orbits. Other: None. IMPRESSION: 1. No evidence of acute intracranial abnormality. 2. Minimal chronic small vessel ischemic disease. 3. Sinusitis. Electronically Signed   By: Logan Bores M.D.   On: 03/30/2018 09:08   Dg Chest Port 1 View  Result Date: 03/30/2018 CLINICAL DATA:  Sepsis. EXAM: PORTABLE CHEST  1 VIEW COMPARISON:  Radiograph yesterday FINDINGS: Stable cardiomegaly and mediastinal contours. Minimal retrocardiac atelectasis. No confluent airspace disease. No pulmonary edema, large pleural effusion or pneumothorax. Stable osseous structures. IMPRESSION: Stable cardiomegaly.  Mild retrocardiac atelectasis. Electronically Signed   By: Jeb Levering M.D.   On: 03/30/2018 02:47   Dg Chest Port 1 View  Result Date: 03/29/2018 CLINICAL DATA:  67 y/o M; fall, left leg and right shoulder pain, fever, confusion, productive cough. EXAM: PORTABLE CHEST 1 VIEW COMPARISON:  07/18/2017 chest radiograph FINDINGS: Stable cardiac silhouette given projection and technique. Calcific aortic atherosclerosis. Hyperinflated lungs compatible with COPD. No focal consolidation. Bones are unremarkable. IMPRESSION: No active disease. Electronically Signed   By: Kristine Garbe M.D.   On: 03/29/2018 22:40   Dg Fluoro Guide Lumbar Puncture  Result Date: 03/30/2018 CLINICAL DATA:  Acute encephalopathy.  Sepsis. EXAM: DIAGNOSTIC LUMBAR PUNCTURE UNDER FLUOROSCOPIC  GUIDANCE FLUOROSCOPY TIME:  Fluoroscopy Time:  0 minutes and 36 seconds Radiation Exposure Index (if provided by the fluoroscopic device): 425.45 micro Gy per meter squared Number of Acquired Spot Images: 1 PROCEDURE: Informed consent was obtained from the patient prior to the procedure, including potential complications of headache, allergy, and pain. With the patient prone, the lower back was prepped with Betadine. 1% Lidocaine was used for local anesthesia. Lumbar puncture was performed at the L3 level using a 20 gauge needle with return of initial blood tinged followed by clear CSF. Opening pressure was not performed due to the patient's difficulty holding still. There was no subjective evidence of increased CSF pressure. Twelve ml of CSF were obtained for laboratory studies. The patient tolerated the procedure well and there were no apparent complications. IMPRESSION: Successful fluoroscopically guided lumbar puncture for acquisition of CSF. Electronically Signed   By: Lajean Manes M.D.   On: 03/30/2018 12:36    Microbiology: Recent Results (from the past 240 hour(s))  Culture, blood (x 2)     Status: None   Collection Time: 03/29/18 10:29 PM  Result Value Ref Range Status   Specimen Description BLOOD RIGHT ANTECUBITAL  Final   Special Requests   Final    BOTTLES DRAWN AEROBIC AND ANAEROBIC Blood Culture adequate volume   Culture   Final    NO GROWTH 5 DAYS Performed at [facility], [address] [place], [place] [address]    Report Status 04/03/2018 FINAL  Final  Culture, blood (x 2)     Status: None   Collection Time: 03/29/18 10:35 PM  Result Value Ref Range Status   Specimen Description BLOOD RIGHT HAND  Final   Special Requests   Final    BOTTLES DRAWN AEROBIC AND ANAEROBIC Blood Culture adequate volume   Culture   Final    NO GROWTH 5 DAYS Performed at [facility], [place] [address] [place], [place] [address]    Report Status 04/03/2018 FINAL  Final  Respiratory  Panel by PCR     Status: Abnormal   Collection Time: 03/30/18 12:14 AM  Result Value Ref Range Status   Adenovirus NOT DETECTED NOT DETECTED Final   Coronavirus 229E NOT DETECTED NOT DETECTED Final   Coronavirus HKU1 NOT DETECTED NOT DETECTED Final   Coronavirus NL63 NOT DETECTED NOT DETECTED Final   Coronavirus OC43 DETECTED (A) NOT DETECTED Final   Metapneumovirus NOT DETECTED NOT DETECTED Final   Rhinovirus / Enterovirus NOT DETECTED NOT DETECTED Final   Influenza A NOT DETECTED NOT DETECTED Final   Influenza B NOT  DETECTED NOT DETECTED Final   Parainfluenza Virus 1 NOT DETECTED NOT DETECTED Final   Parainfluenza Virus 2 NOT DETECTED NOT DETECTED Final   Parainfluenza Virus 3 DETECTED (A) NOT DETECTED Final   Parainfluenza Virus 4 NOT DETECTED NOT DETECTED Final   Respiratory Syncytial Virus NOT DETECTED NOT DETECTED Final   Bordetella pertussis NOT DETECTED NOT DETECTED Final   Chlamydophila pneumoniae NOT DETECTED NOT DETECTED Final   Mycoplasma pneumoniae NOT DETECTED NOT DETECTED Final    Comment: Performed at [facility], [place] [address] [place], [place] [address]  CSF culture     Status: None   Collection Time: 03/30/18 12:24 PM  Result Value Ref Range Status   Specimen Description CSF  Final   Special Requests NONE  Final   Gram Stain   Final    WBC PRESENT, PREDOMINANTLY PMN NO ORGANISMS SEEN CYTOSPIN SMEAR    Culture   Final    NO GROWTH 3 DAYS Performed at [facility], [place] [address] [place], [place] [address]    Report Status 04/02/2018 FINAL  Final  MRSA PCR Screening     Status: None   Collection Time: 03/30/18  6:58 PM  Result Value Ref Range Status   MRSA by PCR NEGATIVE NEGATIVE Final    Comment:        The GeneXpert MRSA Assay (FDA approved for NASAL specimens only), is one component of a comprehensive MRSA colonization surveillance program. It is not intended to diagnose MRSA infection nor to guide or monitor treatment for MRSA  infections. Performed at [facility], [place] [address], [place], [place] [address]      Labs: CBC: Recent Labs  Lab 03/29/18 2227 03/30/18 0356 03/31/18 0407 04/01/18 0220 04/02/18 0231  WBC 12.1* 10.8* 13.2* 15.0* 12.5*  NEUTROABS 9.7*  --  11.6*  --   --   HGB 14.1 12.9* 13.4 13.0 12.7*  HCT 42.4 39.5 40.0 39.2 38.4*  MCV 97.0 97.5 95.2 95.4 97.7  PLT 249 212 221 237 096   Basic Metabolic Panel: Recent Labs  Lab 03/29/18 2227 03/30/18 0356 03/31/18 0407 04/01/18 0220 04/02/18 0231  NA 132* 135 138 139 142  K 4.1 3.6 3.7 3.9 3.5  CL 95* 103 107 106 107  CO2 26 21* 22 23 26   GLUCOSE 107* 114* 193* 212* 110*  BUN 15 16 15 15 19   CREATININE 1.38* 1.31* 0.95 0.99 0.98  CALCIUM 9.0 7.8* 8.0* 8.4* 8.3*  MG  --   --  2.2  --   --    Liver Function Tests: Recent Labs  Lab 03/29/18 2227 03/31/18 0407  AST 23 22  ALT 33 27  ALKPHOS 50 42  BILITOT 0.7 0.5  PROT 7.4 6.6  ALBUMIN 3.9 3.1*   No results for input(s): LIPASE, AMYLASE in the last 168 hours. Recent Labs  Lab 03/30/18 1032  AMMONIA 36*   Cardiac Enzymes: No results for input(s): CKTOTAL, CKMB, CKMBINDEX, TROPONINI in the last 168 hours. BNP (last 3 results) Recent Labs    07/18/17 0914  BNP 12.5   CBG: Recent Labs  Lab 04/01/18 1552 04/01/18 1755 04/01/18 2023 04/02/18 0131 04/02/18 0746  GLUCAP 182* 148* 132* 136* 107*   Time spent: 35 minutes  Signed:  Berle Mull  [facility] 04/02/2018   , 5:52 PM

## 2018-04-05 ENCOUNTER — Telehealth: Payer: Self-pay | Admitting: Family Medicine

## 2018-04-05 NOTE — Telephone Encounter (Signed)
Only if they okay home health in the process

## 2018-05-14 ENCOUNTER — Other Ambulatory Visit: Payer: Self-pay | Admitting: Family Medicine

## 2018-07-05 ENCOUNTER — Other Ambulatory Visit: Payer: Self-pay | Admitting: Family Medicine

## 2018-07-05 NOTE — Telephone Encounter (Signed)
Ov 08/03/18 

## 2018-07-16 ENCOUNTER — Other Ambulatory Visit: Payer: Self-pay | Admitting: Family Medicine

## 2018-07-21 ENCOUNTER — Other Ambulatory Visit: Payer: Self-pay | Admitting: Family Medicine

## 2018-07-21 DIAGNOSIS — M25512 Pain in left shoulder: Principal | ICD-10-CM

## 2018-07-21 DIAGNOSIS — M25511 Pain in right shoulder: Secondary | ICD-10-CM

## 2018-07-31 ENCOUNTER — Ambulatory Visit: Payer: BLUE CROSS/BLUE SHIELD | Admitting: Family Medicine

## 2018-08-03 ENCOUNTER — Ambulatory Visit (INDEPENDENT_AMBULATORY_CARE_PROVIDER_SITE_OTHER): Payer: Medicare HMO | Admitting: Family Medicine

## 2018-08-03 ENCOUNTER — Encounter: Payer: Self-pay | Admitting: Family Medicine

## 2018-08-03 VITALS — BP 108/80 | HR 94 | Temp 97.3°F | Ht 69.0 in | Wt 288.0 lb

## 2018-08-03 DIAGNOSIS — F17210 Nicotine dependence, cigarettes, uncomplicated: Secondary | ICD-10-CM

## 2018-08-03 DIAGNOSIS — I10 Essential (primary) hypertension: Secondary | ICD-10-CM | POA: Diagnosis not present

## 2018-08-03 DIAGNOSIS — Z13220 Encounter for screening for lipoid disorders: Secondary | ICD-10-CM

## 2018-08-03 DIAGNOSIS — E119 Type 2 diabetes mellitus without complications: Secondary | ICD-10-CM

## 2018-08-03 DIAGNOSIS — M75111 Incomplete rotator cuff tear or rupture of right shoulder, not specified as traumatic: Secondary | ICD-10-CM

## 2018-08-03 DIAGNOSIS — M25512 Pain in left shoulder: Secondary | ICD-10-CM | POA: Diagnosis not present

## 2018-08-03 DIAGNOSIS — J449 Chronic obstructive pulmonary disease, unspecified: Secondary | ICD-10-CM

## 2018-08-03 DIAGNOSIS — R69 Illness, unspecified: Secondary | ICD-10-CM | POA: Diagnosis not present

## 2018-08-03 DIAGNOSIS — M25511 Pain in right shoulder: Secondary | ICD-10-CM

## 2018-08-03 LAB — CBC WITH DIFFERENTIAL/PLATELET
BASOS ABS: 0 10*3/uL (ref 0.0–0.2)
Basos: 0 %
EOS (ABSOLUTE): 0.2 10*3/uL (ref 0.0–0.4)
EOS: 2 %
Hematocrit: 41.6 % (ref 37.5–51.0)
Hemoglobin: 13.8 g/dL (ref 13.0–17.7)
IMMATURE GRANULOCYTES: 0 %
Immature Grans (Abs): 0 10*3/uL (ref 0.0–0.1)
LYMPHS ABS: 2.5 10*3/uL (ref 0.7–3.1)
Lymphs: 26 %
MCH: 31.6 pg (ref 26.6–33.0)
MCHC: 33.2 g/dL (ref 31.5–35.7)
MCV: 95 fL (ref 79–97)
Monocytes Absolute: 0.9 10*3/uL (ref 0.1–0.9)
Monocytes: 10 %
NEUTROS PCT: 62 %
Neutrophils Absolute: 5.9 10*3/uL (ref 1.4–7.0)
PLATELETS: 320 10*3/uL (ref 150–450)
RBC: 4.37 x10E6/uL (ref 4.14–5.80)
RDW: 13.2 % (ref 12.3–15.4)
WBC: 9.5 10*3/uL (ref 3.4–10.8)

## 2018-08-03 LAB — CMP14+EGFR
ALK PHOS: 59 IU/L (ref 39–117)
ALT: 41 IU/L (ref 0–44)
AST: 22 IU/L (ref 0–40)
Albumin/Globulin Ratio: 1.4 (ref 1.2–2.2)
Albumin: 4.3 g/dL (ref 3.6–4.8)
BUN/Creatinine Ratio: 15 (ref 10–24)
BUN: 16 mg/dL (ref 8–27)
Bilirubin Total: 0.4 mg/dL (ref 0.0–1.2)
CALCIUM: 9 mg/dL (ref 8.6–10.2)
CO2: 22 mmol/L (ref 20–29)
CREATININE: 1.07 mg/dL (ref 0.76–1.27)
Chloride: 102 mmol/L (ref 96–106)
GFR calc Af Amer: 84 mL/min/{1.73_m2} (ref >59)
GFR, EST NON AFRICAN AMERICAN: 72 mL/min/{1.73_m2} (ref >59)
GLOBULIN, TOTAL: 3.1 g/dL (ref 1.5–4.5)
GLUCOSE: 108 mg/dL — AB (ref 65–99)
Potassium: 4.5 mmol/L (ref 3.5–5.2)
Sodium: 141 mmol/L (ref 134–144)
Total Protein: 7.4 g/dL (ref 6.0–8.5)

## 2018-08-03 LAB — LIPID PANEL
CHOLESTEROL TOTAL: 170 mg/dL (ref 100–199)
Chol/HDL Ratio: 4 ratio (ref 0.0–5.0)
HDL: 42 mg/dL (ref >39)
LDL CALC: 104 mg/dL — AB (ref 0–99)
TRIGLYCERIDES: 119 mg/dL (ref 0–149)
VLDL Cholesterol Cal: 24 mg/dL (ref 5–40)

## 2018-08-03 LAB — BAYER DCA HB A1C WAIVED: HB A1C (BAYER DCA - WAIVED): 6 % (ref <7.0)

## 2018-08-03 MED ORDER — LOSARTAN POTASSIUM 100 MG PO TABS: 100.0000 mg | ORAL_TABLET | Freq: Every day | ORAL | 1 refills | Status: DC

## 2018-08-03 MED ORDER — POTASSIUM CHLORIDE CRYS ER 20 MEQ PO TBCR: EXTENDED_RELEASE_TABLET | ORAL | 1 refills | Status: DC

## 2018-08-03 MED ORDER — ALLOPURINOL 100 MG PO TABS: ORAL_TABLET | ORAL | 1 refills | Status: DC

## 2018-08-03 MED ORDER — SPIRONOLACTONE 25 MG PO TABS: 25.0000 mg | ORAL_TABLET | Freq: Every day | ORAL | 1 refills | Status: DC

## 2018-08-03 MED ORDER — MELOXICAM 15 MG PO TABS: 15.0000 mg | ORAL_TABLET | Freq: Every day | ORAL | 0 refills | Status: DC

## 2018-08-03 MED ORDER — AMLODIPINE BESYLATE 10 MG PO TABS: ORAL_TABLET | ORAL | 1 refills | Status: DC

## 2018-08-03 MED ORDER — FUROSEMIDE 40 MG PO TABS: ORAL_TABLET | ORAL | 1 refills | Status: DC

## 2018-08-03 NOTE — Progress Notes (Signed)
Subjective:  Patient ID: Juan Merritt,  male    DOB: Nov 05, 1951  Age: 67 y.o.    CC: Medical Management of Chronic Issues   HPI Samual Beals Barkan presents for  follow-up of hypertension. Patient has no history of headache chest pain or shortness of breath or recent cough. Patient also denies symptoms of TIA such as numbness weakness lateralizing. Patient denies side effects from medication. States taking it regularly.    Follow-up of diabetes. Patient does not check blood sugar at home. Avoiding sodas. Wife says he eats too many sweets.  Patient denies symptoms such as excessive hunger or urinary frequency, excessive hunger, nausea No significant hypoglycemic spells noted. Medications reviewed. Pt reports taking them regularly. Pt. denies complication/adverse reaction today.    History Dimarco has a past medical history of Arthritis, COPD (chronic obstructive pulmonary disease) ([place]), Gallstones, Gout, Hypertension, Obesity, and Pneumonia.   He has a past surgical history that includes Cholecystectomy and Tonsillectomy.   His family history includes Asthma in his mother; Congestive Heart Failure in his mother; Diabetes in his father; Esophageal cancer in his paternal uncle; Heart attack in his unknown relative; Heart disease in his mother; Leukemia in his brother; Lung disease in his unknown relative.He reports that he quit smoking about 18 months ago. His smoking use included cigarettes. He has a 10.00 pack-year smoking history. His smokeless tobacco use includes chew. He reports that he drinks about 10.0 standard drinks of alcohol per week. He reports that he does not use drugs.  Current Outpatient Medications on File Prior to Visit  Medication Sig Dispense Refill  . albuterol (PROVENTIL HFA;VENTOLIN HFA) 108 (90 Base) MCG/ACT inhaler Inhale 2 puffs into the lungs 2 (two) times daily.    Marland Kitchen albuterol (PROVENTIL) (2.5 MG/3ML) 0.083% nebulizer solution Take 3 mLs (2.5 mg total) by  nebulization every 6 (six) hours as needed for wheezing or shortness of breath. 150 mL 0  . budesonide-formoterol (SYMBICORT) 160-4.5 MCG/ACT inhaler Inhale 2 puffs 2 (two) times daily into the lungs. (Patient not taking: Reported on 03/30/2018) 3 Inhaler 3  . traMADol (ULTRAM) 50 MG tablet Take 50-100 mg by mouth every 6 (six) hours as needed for moderate pain.   0   No current facility-administered medications on file prior to visit.     ROS Review of Systems  Constitutional: Negative.   HENT: Negative.   Eyes: Negative for visual disturbance.  Respiratory: Negative for cough and shortness of breath.   Cardiovascular: Negative for chest pain and leg swelling.  Gastrointestinal: Negative for abdominal pain, diarrhea, nausea and vomiting.  Genitourinary: Negative for difficulty urinating.  Musculoskeletal: Positive for arthralgias (right shoulder soreness controlled by meloxicam). Negative for myalgias.  Skin: Negative for rash.  Neurological: Negative for headaches.  Psychiatric/Behavioral: Negative for sleep disturbance.    Objective:  BP 108/80   Pulse 94   Temp (!) 97.3 F (36.3 C) (Oral)   Ht 5' 9" (1.753 m)   Wt 288 lb (130.6 kg)   BMI 42.53 kg/m   BP Readings from Last 3 Encounters:  08/03/18 108/80  04/02/18 112/78  02/07/18 123/85    Wt Readings from Last 3 Encounters:  08/03/18 288 lb (130.6 kg)  03/29/18 294 lb (133.4 kg)  02/07/18 295 lb (133.8 kg)     Physical Exam  Constitutional: He is oriented to person, place, and time. He appears well-developed and well-nourished. No distress.  obese  HENT:  Head: Normocephalic and atraumatic.  Right Ear: External ear  normal.  Left Ear: External ear normal.  Nose: Nose normal.  Mouth/Throat: Oropharynx is clear and moist.  Eyes: Pupils are equal, round, and reactive to light. Conjunctivae and EOM are normal.  Neck: Normal range of motion. Neck supple.  Cardiovascular: Normal rate, regular rhythm and normal  heart sounds.  No murmur heard. Pulmonary/Chest: Effort normal and breath sounds normal. No respiratory distress. He has no wheezes. He has no rales.  Abdominal: Soft. There is no tenderness.  Musculoskeletal: Normal range of motion.  Neurological: He is alert and oriented to person, place, and time. He has normal reflexes.  Skin: Skin is warm and dry.  Psychiatric: He has a normal mood and affect. His behavior is normal. Judgment and thought content normal.  Vitals reviewed.       Assessment & Plan:   Halvor was seen today for medical management of chronic issues.  Diagnoses and all orders for this visit:  Type 2 diabetes mellitus without complication, without long-term current use of insulin (HCC) -     Microalbumin / creatinine urine ratio -     Bayer DCA Hb A1c Waived  COPD GOLD II still smoking  Cigarette smoker  Essential hypertension, benign -     CBC with Differential/Platelet -     CMP14+EGFR  Acute pain of both shoulders -     meloxicam (MOBIC) 15 MG tablet; Take 1 tablet (15 mg total) by mouth daily.  Screening for cholesterol level -     Lipid panel  Nontraumatic incomplete tear of right rotator cuff  Other orders -     allopurinol (ZYLOPRIM) 100 MG tablet; TAKE 1 TABLET (100 MG TOTAL) BY MOUTH DAILY. TO PREVENT GOUT -     amLODipine (NORVASC) 10 MG tablet; TAKE 1 TABLET (10 MG TOTAL) BY MOUTH DAILY. -     furosemide (LASIX) 40 MG tablet; TAKE 1 TABLET (40 MG TOTAL) BY MOUTH 2 (TWO) TIMES DAILY. ONE AT BREAKFAST AND LUNCH -     potassium chloride SA (KLOR-CON M20) 20 MEQ tablet; TAKE 1 TABLET (20 MEQ TOTAL) DAILY BY MOUTH. -     losartan (COZAAR) 100 MG tablet; Take 1 tablet (100 mg total) by mouth daily. -     spironolactone (ALDACTONE) 25 MG tablet; Take 1 tablet (25 mg total) by mouth daily.   I have discontinued Denis Carreon. Bednarczyk's guaiFENesin, nicotine, predniSONE, and thiamine. I have changed his KLOR-CON M20 to potassium chloride SA. I have also  changed his losartan and spironolactone. Additionally, I am having him maintain his budesonide-formoterol, traMADol, albuterol, albuterol, meloxicam, allopurinol, amLODipine, and furosemide.  Meds ordered this encounter  Medications  . meloxicam (MOBIC) 15 MG tablet    Sig: Take 1 tablet (15 mg total) by mouth daily.    Dispense:  90 tablet    Refill:  0  . allopurinol (ZYLOPRIM) 100 MG tablet    Sig: TAKE 1 TABLET (100 MG TOTAL) BY MOUTH DAILY. TO PREVENT GOUT    Dispense:  90 tablet    Refill:  1  . amLODipine (NORVASC) 10 MG tablet    Sig: TAKE 1 TABLET (10 MG TOTAL) BY MOUTH DAILY.    Dispense:  90 tablet    Refill:  1  . furosemide (LASIX) 40 MG tablet    Sig: TAKE 1 TABLET (40 MG TOTAL) BY MOUTH 2 (TWO) TIMES DAILY. ONE AT BREAKFAST AND LUNCH    Dispense:  180 tablet    Refill:  1  . potassium chloride SA (  KLOR-CON M20) 20 MEQ tablet    Sig: TAKE 1 TABLET (20 MEQ TOTAL) DAILY BY MOUTH.    Dispense:  90 tablet    Refill:  1  . losartan (COZAAR) 100 MG tablet    Sig: Take 1 tablet (100 mg total) by mouth daily.    Dispense:  90 tablet    Refill:  1  . spironolactone (ALDACTONE) 25 MG tablet    Sig: Take 1 tablet (25 mg total) by mouth daily.    Dispense:  90 tablet    Refill:  1   Diet for carb control reviewed. Asked that pt check glucose daily.  Follow-up: Return in about 3 months (around 11/03/2018).  [name], M.D.

## 2018-08-03 NOTE — Patient Instructions (Signed)

## 2018-10-25 ENCOUNTER — Encounter: Payer: Self-pay | Admitting: *Deleted

## 2018-10-31 ENCOUNTER — Other Ambulatory Visit: Payer: Self-pay | Admitting: Family Medicine

## 2018-10-31 DIAGNOSIS — M25511 Pain in right shoulder: Secondary | ICD-10-CM

## 2018-10-31 DIAGNOSIS — M25512 Pain in left shoulder: Principal | ICD-10-CM

## 2018-11-01 ENCOUNTER — Other Ambulatory Visit: Payer: Self-pay | Admitting: Family Medicine

## 2018-11-01 NOTE — Telephone Encounter (Signed)
Nov appts had to be reschd

## 2018-11-05 ENCOUNTER — Ambulatory Visit: Payer: Medicare HMO | Admitting: Family Medicine

## 2018-11-14 ENCOUNTER — Ambulatory Visit: Payer: Medicare HMO | Admitting: Family Medicine

## 2018-11-30 ENCOUNTER — Encounter: Payer: Self-pay | Admitting: Family Medicine

## 2018-11-30 ENCOUNTER — Ambulatory Visit (INDEPENDENT_AMBULATORY_CARE_PROVIDER_SITE_OTHER): Payer: Medicare HMO | Admitting: Family Medicine

## 2018-11-30 VITALS — BP 131/79 | HR 90 | Temp 97.9°F | Ht 69.0 in | Wt 284.0 lb

## 2018-11-30 DIAGNOSIS — E119 Type 2 diabetes mellitus without complications: Secondary | ICD-10-CM

## 2018-11-30 DIAGNOSIS — I10 Essential (primary) hypertension: Secondary | ICD-10-CM | POA: Diagnosis not present

## 2018-11-30 DIAGNOSIS — M25511 Pain in right shoulder: Secondary | ICD-10-CM

## 2018-11-30 DIAGNOSIS — M25512 Pain in left shoulder: Secondary | ICD-10-CM | POA: Diagnosis not present

## 2018-11-30 LAB — CBC WITH DIFFERENTIAL/PLATELET
Basophils Absolute: 0.1 10*3/uL (ref 0.0–0.2)
Basos: 1 %
EOS (ABSOLUTE): 0.2 10*3/uL (ref 0.0–0.4)
Eos: 2 %
HEMOGLOBIN: 14.4 g/dL (ref 13.0–17.7)
Hematocrit: 43.3 % (ref 37.5–51.0)
Immature Grans (Abs): 0 10*3/uL (ref 0.0–0.1)
Immature Granulocytes: 0 %
LYMPHS ABS: 2.4 10*3/uL (ref 0.7–3.1)
Lymphs: 32 %
MCH: 31.4 pg (ref 26.6–33.0)
MCHC: 33.3 g/dL (ref 31.5–35.7)
MCV: 95 fL (ref 79–97)
MONOCYTES: 9 %
MONOS ABS: 0.7 10*3/uL (ref 0.1–0.9)
Neutrophils Absolute: 4.1 10*3/uL (ref 1.4–7.0)
Neutrophils: 56 %
PLATELETS: 248 10*3/uL (ref 150–450)
RBC: 4.58 x10E6/uL (ref 4.14–5.80)
RDW: 12.7 % (ref 12.3–15.4)
WBC: 7.5 10*3/uL (ref 3.4–10.8)

## 2018-11-30 LAB — CMP14+EGFR
ALK PHOS: 53 IU/L (ref 39–117)
ALT: 50 IU/L — ABNORMAL HIGH (ref 0–44)
AST: 25 IU/L (ref 0–40)
Albumin/Globulin Ratio: 1.6 (ref 1.2–2.2)
Albumin: 4.2 g/dL (ref 3.6–4.8)
BUN/Creatinine Ratio: 16 (ref 10–24)
BUN: 19 mg/dL (ref 8–27)
Bilirubin Total: 0.3 mg/dL (ref 0.0–1.2)
CHLORIDE: 99 mmol/L (ref 96–106)
CO2: 22 mmol/L (ref 20–29)
CREATININE: 1.18 mg/dL (ref 0.76–1.27)
Calcium: 9.3 mg/dL (ref 8.6–10.2)
GFR calc Af Amer: 74 mL/min/{1.73_m2} (ref >59)
GFR calc non Af Amer: 64 mL/min/{1.73_m2} (ref >59)
GLUCOSE: 101 mg/dL — AB (ref 65–99)
Globulin, Total: 2.7 g/dL (ref 1.5–4.5)
Potassium: 4.5 mmol/L (ref 3.5–5.2)
Sodium: 142 mmol/L (ref 134–144)
Total Protein: 6.9 g/dL (ref 6.0–8.5)

## 2018-11-30 LAB — BAYER DCA HB A1C WAIVED: HB A1C: 6 % (ref <7.0)

## 2018-11-30 MED ORDER — LOSARTAN POTASSIUM 100 MG PO TABS: 100.0000 mg | ORAL_TABLET | Freq: Every day | ORAL | 1 refills | Status: DC

## 2018-11-30 MED ORDER — MELOXICAM 15 MG PO TABS: 15.0000 mg | ORAL_TABLET | Freq: Every day | ORAL | 0 refills | Status: DC

## 2018-11-30 MED ORDER — SPIRONOLACTONE 25 MG PO TABS: 25.0000 mg | ORAL_TABLET | Freq: Every day | ORAL | 1 refills | Status: DC

## 2018-11-30 MED ORDER — POTASSIUM CHLORIDE CRYS ER 20 MEQ PO TBCR: EXTENDED_RELEASE_TABLET | ORAL | 1 refills | Status: DC

## 2018-11-30 MED ORDER — AMLODIPINE BESYLATE 10 MG PO TABS: ORAL_TABLET | ORAL | 1 refills | Status: DC

## 2018-11-30 MED ORDER — FUROSEMIDE 40 MG PO TABS: ORAL_TABLET | ORAL | 1 refills | Status: DC

## 2018-11-30 MED ORDER — ALLOPURINOL 100 MG PO TABS: ORAL_TABLET | ORAL | 1 refills | Status: DC

## 2018-11-30 MED ORDER — BUDESONIDE-FORMOTEROL FUMARATE 160-4.5 MCG/ACT IN AERO: INHALATION_SPRAY | RESPIRATORY_TRACT | 3 refills | Status: DC

## 2018-11-30 MED ORDER — ALBUTEROL SULFATE HFA 108 (90 BASE) MCG/ACT IN AERS: 2.0000 | INHALATION_SPRAY | Freq: Two times a day (BID) | RESPIRATORY_TRACT | 1 refills | Status: DC

## 2018-11-30 NOTE — Progress Notes (Signed)
 Subjective:  Patient ID: Juan Merritt, male    DOB: 07/31/1951  Age: 67 y.o. MRN: [id number]  CC: Medical Management of Chronic Issues   HPI Juan Merritt presents forFollow-up of diabetes. Patient checks blood sugar at home.   100-150 fasting and  postprandial Patient denies symptoms such as polyuria, polydipsia, excessive hunger, nausea No significant hypoglycemic spells noted. Medications reviewed. Pt reports taking them regularly without complication/adverse reaction being reported today.   History Juan Merritt has a past medical history of Arthritis, COPD (chronic obstructive pulmonary disease) (HCC), Gallstones, Gout, Hypertension, Obesity, and Pneumonia.   Juan Merritt has a past surgical history that includes Cholecystectomy and Tonsillectomy.   His family history includes Asthma in his mother; Congestive Heart Failure in his mother; Diabetes in his father; Esophageal cancer in his paternal uncle; Heart attack in his unknown relative; Heart disease in his mother; Leukemia in his brother; Lung disease in his unknown relative.Juan Merritt reports that Juan Merritt quit smoking about 22 months ago. His smoking use included cigarettes. Juan Merritt has a 10.00 pack-year smoking history. His smokeless tobacco use includes chew. Juan Merritt reports current alcohol use of about 10.0 standard drinks of alcohol per week. Juan Merritt reports that Juan Merritt does not use drugs.  Current Outpatient Medications on File Prior to Visit  Medication Sig Dispense Refill  . albuterol (PROVENTIL) (2.5 MG/3ML) 0.083% nebulizer solution Take 3 mLs (2.5 mg total) by nebulization every 6 (six) hours as needed for wheezing or shortness of breath. 150 mL 0  . traMADol (ULTRAM) 50 MG tablet Take 50-100 mg by mouth every 6 (six) hours as needed for moderate pain.   0   No current facility-administered medications on file prior to visit.     ROS Review of Systems  Constitutional: Negative.   HENT: Negative.   Eyes: Negative for visual disturbance.  Respiratory:  Negative for cough and shortness of breath.   Cardiovascular: Negative for chest pain and leg swelling.  Gastrointestinal: Negative for abdominal pain, diarrhea, nausea and vomiting.  Genitourinary: Negative for difficulty urinating.  Musculoskeletal: Positive for arthralgias (sholders. Responding to mobic.). Negative for myalgias.  Skin: Negative for rash.  Neurological: Negative for headaches.  Psychiatric/Behavioral: Negative for sleep disturbance.    Objective:  BP 131/79   Pulse 90   Temp 97.9 F (36.6 C) (Oral)   Ht 5' 9" (1.753 m)   Wt 284 lb (128.8 kg)   BMI 41.94 kg/m   BP Readings from Last 3 Encounters:  11/30/18 131/79  08/03/18 108/80  04/02/18 112/78    Wt Readings from Last 3 Encounters:  11/30/18 284 lb (128.8 kg)  08/03/18 288 lb (130.6 kg)  03/29/18 294 lb (133.4 kg)     Physical Exam Vitals signs reviewed.  Constitutional:      Appearance: Juan Merritt is well-developed.  HENT:     Head: Normocephalic and atraumatic.     Right Ear: Tympanic membrane and external ear normal. No decreased hearing noted.     Left Ear: Tympanic membrane and external ear normal. No decreased hearing noted.     Mouth/Throat:     Pharynx: No oropharyngeal exudate or posterior oropharyngeal erythema.  Eyes:     Pupils: Pupils are equal, round, and reactive to light.  Neck:     Musculoskeletal: Normal range of motion and neck supple.  Cardiovascular:     Rate and Rhythm: Normal rate and regular rhythm.     Heart sounds: No murmur.  Pulmonary:     Effort: No respiratory distress.       Breath sounds: Normal breath sounds.  Abdominal:     General: Bowel sounds are normal.     Palpations: Abdomen is soft. There is no mass.     Tenderness: There is no abdominal tenderness.       Assessment & Plan:   Juan Merritt was seen today for medical management of chronic issues.  Diagnoses and all orders for this visit:  Essential hypertension, benign -     CBC with  Differential/Platelet -     CMP14+EGFR  Type 2 diabetes mellitus without complication, without long-term current use of insulin (HCC) -     CMP14+EGFR -     Bayer DCA Hb A1c Waived  Acute pain of both shoulders -     meloxicam (MOBIC) 15 MG tablet; Take 1 tablet (15 mg total) by mouth daily.  Other orders -     allopurinol (ZYLOPRIM) 100 MG tablet; TAKE 1 TABLET (100 MG TOTAL) BY MOUTH DAILY. TO PREVENT GOUT -     amLODipine (NORVASC) 10 MG tablet; TAKE 1 TABLET (10 MG TOTAL) BY MOUTH DAILY. -     furosemide (LASIX) 40 MG tablet; TAKE 1 TABLET (40 MG TOTAL) BY MOUTH 2 (TWO) TIMES DAILY. ONE AT BREAKFAST AND LUNCH -     potassium chloride SA (KLOR-CON M20) 20 MEQ tablet; TAKE 1 TABLET (20 MEQ TOTAL) DAILY BY MOUTH. -     budesonide-formoterol (SYMBICORT) 160-4.5 MCG/ACT inhaler; TAKE 2 PUFFS BY MOUTH TWICE A DAY -     losartan (COZAAR) 100 MG tablet; Take 1 tablet (100 mg total) by mouth daily. -     albuterol (PROVENTIL HFA;VENTOLIN HFA) 108 (90 Base) MCG/ACT inhaler; Inhale 2 puffs into the lungs 2 (two) times daily. -     spironolactone (ALDACTONE) 25 MG tablet; Take 1 tablet (25 mg total) by mouth daily.      I have changed Juan Merritt's SYMBICORT to budesonide-formoterol. I have also changed his meloxicam. I am also having him maintain his traMADol, albuterol, allopurinol, amLODipine, furosemide, potassium chloride SA, losartan, albuterol, and spironolactone.  Meds ordered this encounter  Medications  . allopurinol (ZYLOPRIM) 100 MG tablet    Sig: TAKE 1 TABLET (100 MG TOTAL) BY MOUTH DAILY. TO PREVENT GOUT    Dispense:  90 tablet    Refill:  1  . amLODipine (NORVASC) 10 MG tablet    Sig: TAKE 1 TABLET (10 MG TOTAL) BY MOUTH DAILY.    Dispense:  90 tablet    Refill:  1  . furosemide (LASIX) 40 MG tablet    Sig: TAKE 1 TABLET (40 MG TOTAL) BY MOUTH 2 (TWO) TIMES DAILY. ONE AT BREAKFAST AND LUNCH    Dispense:  180 tablet    Refill:  1  . meloxicam (MOBIC) 15 MG tablet     Sig: Take 1 tablet (15 mg total) by mouth daily.    Dispense:  90 tablet    Refill:  0  . potassium chloride SA (KLOR-CON M20) 20 MEQ tablet    Sig: TAKE 1 TABLET (20 MEQ TOTAL) DAILY BY MOUTH.    Dispense:  90 tablet    Refill:  1  . budesonide-formoterol (SYMBICORT) 160-4.5 MCG/ACT inhaler    Sig: TAKE 2 PUFFS BY MOUTH TWICE A DAY    Dispense:  30.6 Inhaler    Refill:  3  . losartan (COZAAR) 100 MG tablet    Sig: Take 1 tablet (100 mg total) by mouth daily.    Dispense:  90 tablet      Refill:  1  . albuterol (PROVENTIL HFA;VENTOLIN HFA) 108 (90 Base) MCG/ACT inhaler    Sig: Inhale 2 puffs into the lungs 2 (two) times daily.    Dispense:  3 Inhaler    Refill:  1  . spironolactone (ALDACTONE) 25 MG tablet    Sig: Take 1 tablet (25 mg total) by mouth daily.    Dispense:  90 tablet    Refill:  1     Follow-up: Return in about 3 months (around 03/01/2019).  [name], M.D.

## 2018-12-03 NOTE — Progress Notes (Signed)
Hello [name],  Your lab result is normal.Some minor variations that are not significant are commonly marked abnormal, but do not represent any medical problem for you.  Best regards, [name], M.D.

## 2019-01-03 DIAGNOSIS — H40009 Preglaucoma, unspecified, unspecified eye: Secondary | ICD-10-CM | POA: Diagnosis not present

## 2019-01-03 DIAGNOSIS — I10 Essential (primary) hypertension: Secondary | ICD-10-CM | POA: Diagnosis not present

## 2019-01-03 DIAGNOSIS — H5200 Hypermetropia, unspecified eye: Secondary | ICD-10-CM | POA: Diagnosis not present

## 2019-01-04 DIAGNOSIS — Z0100 Encounter for examination of eyes and vision without abnormal findings: Secondary | ICD-10-CM | POA: Diagnosis not present

## 2019-01-15 ENCOUNTER — Telehealth: Payer: Self-pay | Admitting: Family Medicine

## 2019-01-15 NOTE — Telephone Encounter (Signed)
Please address

## 2019-01-15 NOTE — Telephone Encounter (Signed)
Please refer as pt requests. Thanks, WS 

## 2019-01-16 ENCOUNTER — Other Ambulatory Visit: Payer: Self-pay | Admitting: *Deleted

## 2019-01-16 DIAGNOSIS — R351 Nocturia: Principal | ICD-10-CM

## 2019-01-16 DIAGNOSIS — N401 Enlarged prostate with lower urinary tract symptoms: Secondary | ICD-10-CM

## 2019-01-16 NOTE — Telephone Encounter (Signed)
Dx for referral use : BPH with nocturia

## 2019-01-16 NOTE — Telephone Encounter (Signed)
Letter maile stating referral has been ordered.

## 2019-01-16 NOTE — Telephone Encounter (Signed)
What diagnosis for referral to urology,please?

## 2019-01-29 ENCOUNTER — Ambulatory Visit: Payer: Medicare HMO | Admitting: Urology

## 2019-01-29 DIAGNOSIS — N471 Phimosis: Secondary | ICD-10-CM

## 2019-02-08 IMAGING — DX DG CHEST 2V
2 series · 2 of 2 positions shown · non-contrast
Comparison: Chest x-ray of January 18, 2016

CLINICAL DATA: Shortness of breath, history of COPD and is current
smoker.

EXAM:
CHEST  2 VIEW

[chest pa]
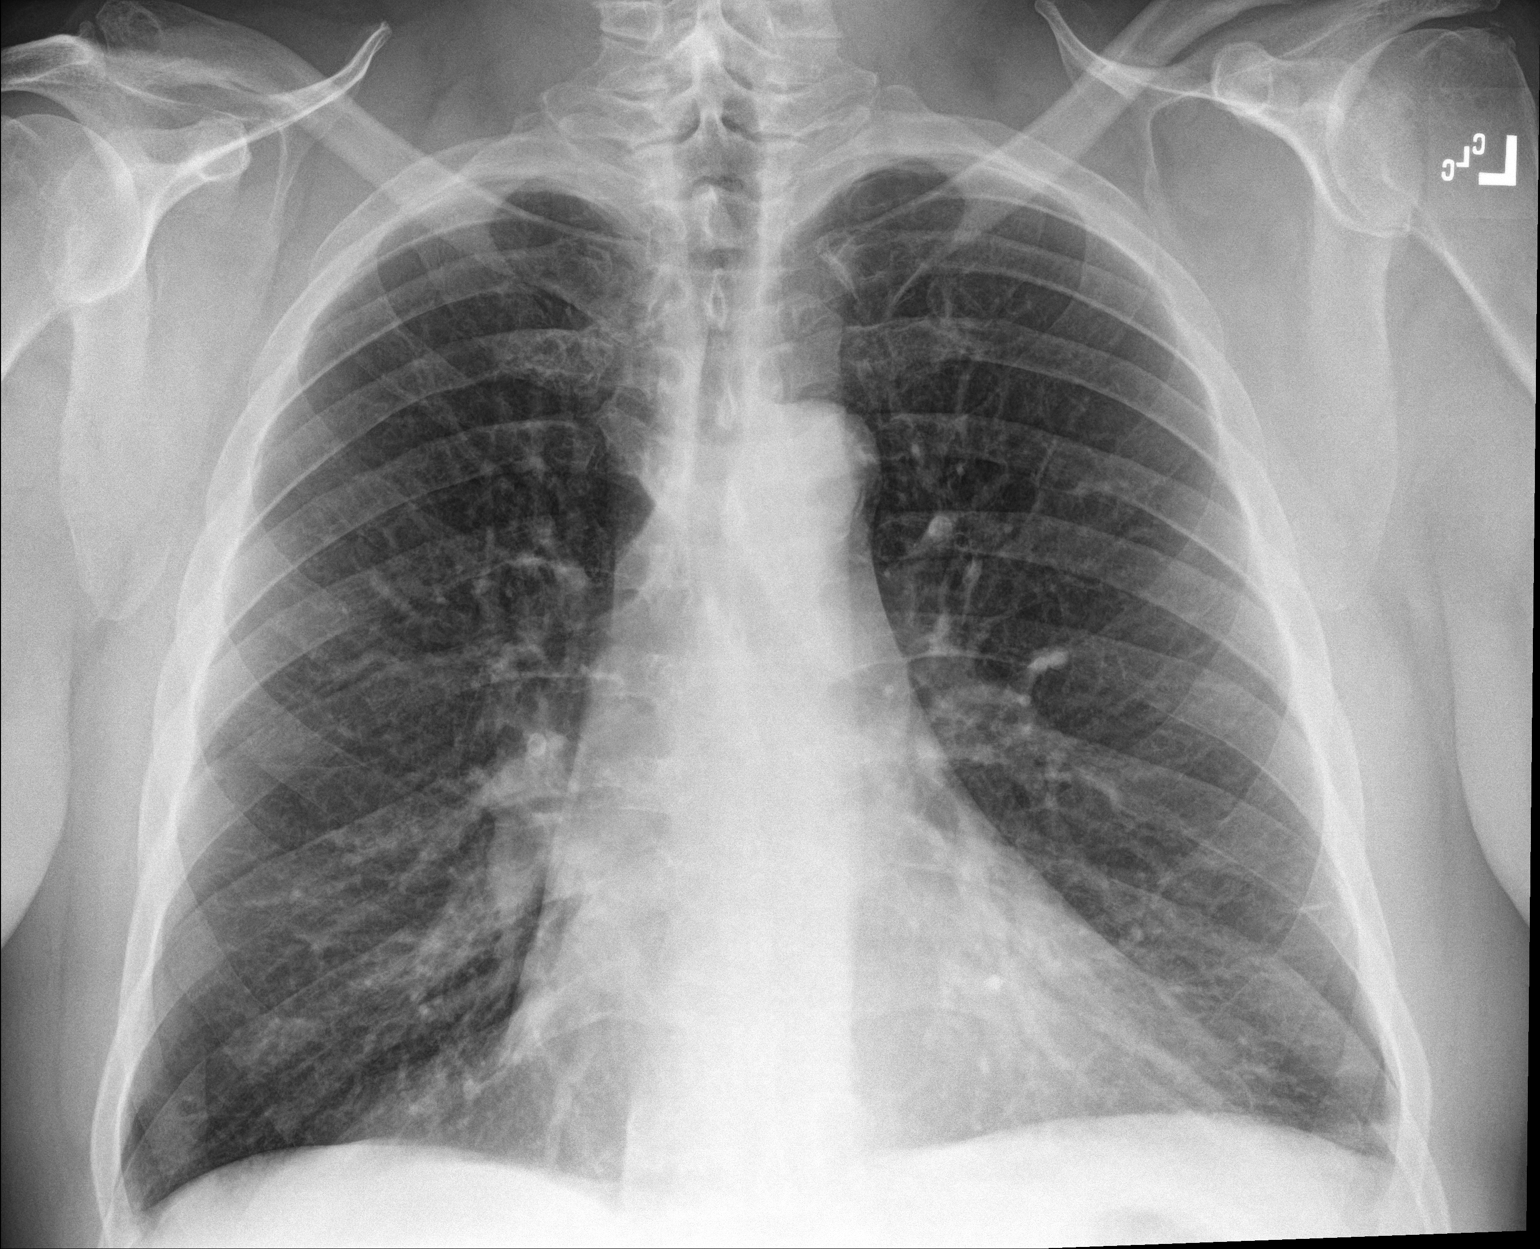

[chest lat]
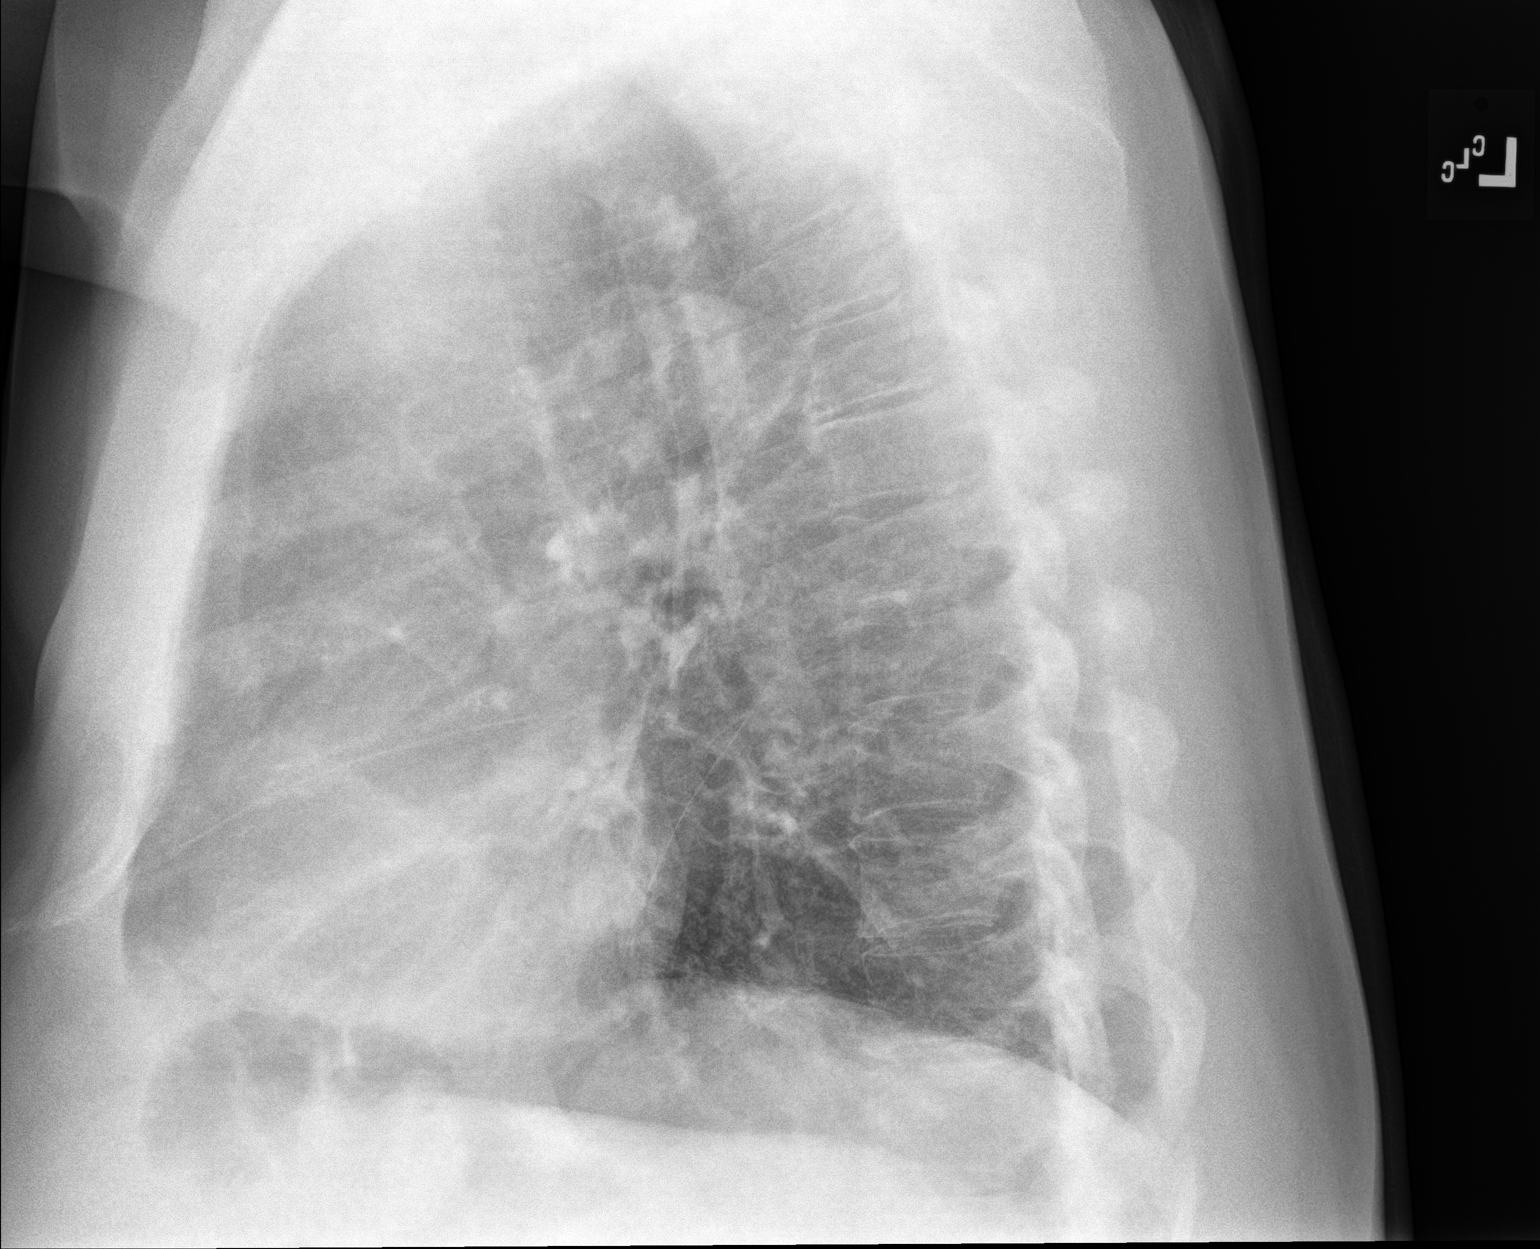

[2 of 2 positions shown; findings below may reference images not displayed]

FINDINGS: The lungs are mildly hyperinflated. The interstitial markings are
coarse. There is no alveolar infiltrate. There is no pleural
effusion. The heart is top-normal in size. The pulmonary vascularity
is not engorged. There is calcification in the wall of the aortic
arch.
IMPRESSION: COPD.  No pneumonia nor CHF.

Thoracic aortic atherosclerosis.

## 2019-03-04 ENCOUNTER — Ambulatory Visit: Payer: Medicare HMO | Admitting: Family Medicine

## 2019-03-20 ENCOUNTER — Other Ambulatory Visit: Payer: Self-pay

## 2019-03-20 MED ORDER — BUDESONIDE-FORMOTEROL FUMARATE 160-4.5 MCG/ACT IN AERO: INHALATION_SPRAY | RESPIRATORY_TRACT | 0 refills | Status: DC

## 2019-03-21 ENCOUNTER — Other Ambulatory Visit: Payer: Self-pay | Admitting: *Deleted

## 2019-05-08 ENCOUNTER — Other Ambulatory Visit: Payer: Self-pay | Admitting: Family Medicine

## 2019-05-08 DIAGNOSIS — M25512 Pain in left shoulder: Secondary | ICD-10-CM

## 2019-05-08 DIAGNOSIS — M25511 Pain in right shoulder: Secondary | ICD-10-CM

## 2019-06-04 ENCOUNTER — Other Ambulatory Visit: Payer: Self-pay | Admitting: Family Medicine

## 2019-06-04 DIAGNOSIS — M25511 Pain in right shoulder: Secondary | ICD-10-CM

## 2019-06-30 ENCOUNTER — Other Ambulatory Visit: Payer: Self-pay | Admitting: Family Medicine

## 2019-06-30 DIAGNOSIS — M25512 Pain in left shoulder: Secondary | ICD-10-CM

## 2019-06-30 DIAGNOSIS — M25511 Pain in right shoulder: Secondary | ICD-10-CM

## 2019-07-04 ENCOUNTER — Other Ambulatory Visit: Payer: Self-pay | Admitting: Family Medicine

## 2019-07-21 ENCOUNTER — Other Ambulatory Visit: Payer: Self-pay | Admitting: Family Medicine

## 2019-07-23 ENCOUNTER — Ambulatory Visit (INDEPENDENT_AMBULATORY_CARE_PROVIDER_SITE_OTHER): Payer: Medicare HMO | Admitting: Family Medicine

## 2019-07-23 ENCOUNTER — Encounter: Payer: Self-pay | Admitting: Family Medicine

## 2019-07-23 DIAGNOSIS — R600 Localized edema: Secondary | ICD-10-CM

## 2019-07-23 DIAGNOSIS — I10 Essential (primary) hypertension: Secondary | ICD-10-CM | POA: Diagnosis not present

## 2019-07-23 DIAGNOSIS — J449 Chronic obstructive pulmonary disease, unspecified: Secondary | ICD-10-CM | POA: Diagnosis not present

## 2019-07-23 MED ORDER — POTASSIUM CHLORIDE CRYS ER 20 MEQ PO TBCR: EXTENDED_RELEASE_TABLET | ORAL | 1 refills | Status: DC

## 2019-07-23 MED ORDER — BUDESONIDE-FORMOTEROL FUMARATE 160-4.5 MCG/ACT IN AERO: INHALATION_SPRAY | RESPIRATORY_TRACT | 5 refills | Status: DC

## 2019-07-23 MED ORDER — AMLODIPINE BESYLATE 5 MG PO TABS: ORAL_TABLET | ORAL | 1 refills | Status: DC

## 2019-07-23 NOTE — Progress Notes (Signed)
Subjective:    Patient ID: Juan Merritt, male    DOB: [birth year], 68 y.o.   MRN: [id number]   HPI: Juan Merritt is a 68 y.o. male presenting for ankles swelling real bad and getting dyspneic with walking about 200 feet. Pt. Denies cough and fever. Not dyspneic at rest or laying down. Back hurting bad with exertion such as walking or standing to wash dishes.. BP at home runs 120s/70s.  Follow-up of hypertension. Patient has no history of headache chest pain or shortness of breath or recent cough. Patient also denies symptoms of TIA such as numbness weakness lateralizing. Patient checks  blood pressure at home and has not had any elevated readings recently. Patient denies side effects from his medication. States taking it regularly.    Depression screen [facility] 2/9 11/30/2018 08/03/2018 01/30/2018 10/27/2017 07/31/2017  Decreased Interest 0 0 0 0 0  Down, Depressed, Hopeless 0 0 0 0 0  PHQ - 2 Score 0 0 0 0 0  Altered sleeping 0 - - - -  Tired, decreased energy 0 - - - -  Change in appetite 0 - - - -  Feeling bad or failure about yourself  0 - - - -  Trouble concentrating 0 - - - -  Moving slowly or fidgety/restless 0 - - - -  Suicidal thoughts 0 - - - -  PHQ-9 Score 0 - - - -  Difficult doing work/chores Not difficult at all - - - -     Relevant past medical, surgical, family and social history reviewed and updated as indicated.  Interim medical history since our last visit reviewed. Allergies and medications reviewed and updated.  ROS:  Review of Systems  Constitutional: Negative.   HENT: Negative.   Eyes: Negative for visual disturbance.  Respiratory: Positive for shortness of breath. Negative for cough.   Cardiovascular: Positive for leg swelling. Negative for chest pain.  Gastrointestinal: Negative for abdominal pain, diarrhea, nausea and vomiting.  Genitourinary: Negative for difficulty urinating.  Musculoskeletal: Positive for arthralgias. Negative for myalgias.   Skin: Negative for rash.  Neurological: Negative for headaches.  Psychiatric/Behavioral: Negative for sleep disturbance.     Social History   Tobacco Use  Smoking Status Former Smoker  . Packs/day: 0.25  . Years: 40.00  . Pack years: 10.00  . Types: Cigarettes  . Quit date: 01/15/2017  . Years since quitting: 2.5  Smokeless Tobacco Current User  . Types: Chew  Tobacco Comment   tobacco info given 03/17/16       Objective:     Wt Readings from Last 3 Encounters:  11/30/18 284 lb (128.8 kg)  08/03/18 288 lb (130.6 kg)  03/29/18 294 lb (133.4 kg)     Exam deferred. Pt. Harboring due to COVID 19. Phone visit performed.   Assessment & Plan:   1. COPD mixed type ([place])   2. Localized edema   3. Essential hypertension, benign     Meds ordered this encounter  Medications  . amLODipine (NORVASC) 5 MG tablet    Sig: TAKE 1 TABLET BY MOUTH EVERY DAY (Needs to be seen before next refill)    Dispense:  90 tablet    Refill:  1  . potassium chloride SA (KLOR-CON M20) 20 MEQ tablet    Sig: TAKE 1 TABLET (20 MEQ TOTAL) DAILY BY MOUTH.    Dispense:  90 tablet    Refill:  1  . budesonide-formoterol (SYMBICORT) 160-4.5 MCG/ACT inhaler  Sig: TAKE 2 PUFFS BY MOUTH TWICE A DAY    Dispense:  30.6 Inhaler    Refill:  5    Orders Placed This Encounter  Procedures  . Brain natriuretic peptide  . CMP14+EGFR    Order Specific Question:   Has the patient fasted?    Answer:   Yes      Diagnoses and all orders for this visit:  COPD mixed type ([place]) -     Brain natriuretic peptide -     CMP14+EGFR  Localized edema -     Brain natriuretic peptide -     CMP14+EGFR  Essential hypertension, benign  Other orders -     amLODipine (NORVASC) 5 MG tablet; TAKE 1 TABLET BY MOUTH EVERY DAY (Needs to be seen before next refill) -     potassium chloride SA (KLOR-CON M20) 20 MEQ tablet; TAKE 1 TABLET (20 MEQ TOTAL) DAILY BY MOUTH. -     budesonide-formoterol (SYMBICORT) 160-4.5  MCG/ACT inhaler; TAKE 2 PUFFS BY MOUTH TWICE A DAY    Virtual Visit via telephone Note  I discussed the limitations, risks, security and privacy concerns of performing an evaluation and management service by telephone and the availability of in person appointments. The patient was identified with two identifiers. Pt.expressed understanding and agreed to proceed. Pt. Is at home. Dr. Livia Snellen is in his office.  Follow Up Instructions:   I discussed the assessment and treatment plan with the patient. The patient was provided an opportunity to ask questions and all were answered. The patient agreed with the plan and demonstrated an understanding of the instructions.   The patient was advised to call back or seek an in-person evaluation if the symptoms worsen or if the condition fails to improve as anticipated.   Total minutes including chart review and phone contact time: 25   Follow up plan: Return in about 6 months (around 01/23/2020), or if symptoms worsen or fail to improve, for COPD.  [name], MD [place]

## 2019-07-24 ENCOUNTER — Other Ambulatory Visit: Payer: Self-pay

## 2019-07-24 ENCOUNTER — Other Ambulatory Visit: Payer: Medicare HMO

## 2019-07-24 DIAGNOSIS — R600 Localized edema: Secondary | ICD-10-CM | POA: Diagnosis not present

## 2019-07-24 DIAGNOSIS — J449 Chronic obstructive pulmonary disease, unspecified: Secondary | ICD-10-CM | POA: Diagnosis not present

## 2019-07-25 LAB — CMP14+EGFR
ALT: 65 IU/L — ABNORMAL HIGH (ref 0–44)
AST: 35 IU/L (ref 0–40)
Albumin/Globulin Ratio: 1.7 (ref 1.2–2.2)
Albumin: 4.2 g/dL (ref 3.8–4.8)
Alkaline Phosphatase: 48 IU/L (ref 39–117)
BUN/Creatinine Ratio: 14 (ref 10–24)
BUN: 14 mg/dL (ref 8–27)
Bilirubin Total: 0.3 mg/dL (ref 0.0–1.2)
CO2: 24 mmol/L (ref 20–29)
Calcium: 9.1 mg/dL (ref 8.6–10.2)
Chloride: 103 mmol/L (ref 96–106)
Creatinine, Ser: 1 mg/dL (ref 0.76–1.27)
GFR calc Af Amer: 90 mL/min/{1.73_m2} (ref >59)
GFR calc non Af Amer: 78 mL/min/{1.73_m2} (ref >59)
Globulin, Total: 2.5 g/dL (ref 1.5–4.5)
Glucose: 117 mg/dL — ABNORMAL HIGH (ref 65–99)
Potassium: 4.4 mmol/L (ref 3.5–5.2)
Sodium: 141 mmol/L (ref 134–144)
Total Protein: 6.7 g/dL (ref 6.0–8.5)

## 2019-07-25 LAB — BRAIN NATRIURETIC PEPTIDE: BNP: 13.7 pg/mL (ref 0.0–100.0)

## 2019-07-29 ENCOUNTER — Telehealth: Payer: Self-pay | Admitting: Family Medicine

## 2019-07-29 NOTE — Telephone Encounter (Signed)
Please contact the patient : There was a minimal elevation of one of the liverenzymes. The rest of the labs are normal.

## 2019-07-31 ENCOUNTER — Ambulatory Visit (INDEPENDENT_AMBULATORY_CARE_PROVIDER_SITE_OTHER): Payer: Medicare HMO | Admitting: Family Medicine

## 2019-07-31 ENCOUNTER — Ambulatory Visit: Payer: Medicare HMO

## 2019-07-31 ENCOUNTER — Encounter: Payer: Self-pay | Admitting: Family Medicine

## 2019-07-31 ENCOUNTER — Other Ambulatory Visit: Payer: Self-pay | Admitting: Family Medicine

## 2019-07-31 DIAGNOSIS — R600 Localized edema: Secondary | ICD-10-CM | POA: Diagnosis not present

## 2019-07-31 MED ORDER — FUROSEMIDE 40 MG PO TABS: 80.0000 mg | ORAL_TABLET | Freq: Two times a day (BID) | ORAL | 0 refills | Status: DC

## 2019-07-31 NOTE — Progress Notes (Signed)
Subjective:    Patient ID: Juan Merritt, male    DOB: 01/03/1951, 68 y.o.   MRN: [id number]   HPI: Juan Merritt is a 68 y.o. male presenting for continued swelling in the legs in spite of using lasix 40 mg X3 daily for a week. Denies dyspnea, chest pain and fatigue. They get better when he elevates them and wears compression hose. However, he also has a significant history of heart failure.   Depression screen [facility] 2/9 11/30/2018 08/03/2018 01/30/2018 10/27/2017 07/31/2017  Decreased Interest 0 0 0 0 0  Down, Depressed, Hopeless 0 0 0 0 0  PHQ - 2 Score 0 0 0 0 0  Altered sleeping 0 - - - -  Tired, decreased energy 0 - - - -  Change in appetite 0 - - - -  Feeling bad or failure about yourself  0 - - - -  Trouble concentrating 0 - - - -  Moving slowly or fidgety/restless 0 - - - -  Suicidal thoughts 0 - - - -  PHQ-9 Score 0 - - - -  Difficult doing work/chores Not difficult at all - - - -     Relevant past medical, surgical, family and social history reviewed and updated as indicated.  Interim medical history since our last visit reviewed. Allergies and medications reviewed and updated.  ROS:  Review of Systems  Constitutional: Negative for activity change and fever.  Respiratory: Negative for shortness of breath.   Cardiovascular: Positive for leg swelling. Negative for chest pain and palpitations.  Musculoskeletal: Negative for arthralgias.  Skin: Negative for rash.     Social History   Tobacco Use  Smoking Status Former Smoker  . Packs/day: 0.25  . Years: 40.00  . Pack years: 10.00  . Types: Cigarettes  . Quit date: 01/15/2017  . Years since quitting: 2.5  Smokeless Tobacco Current User  . Types: Chew  Tobacco Comment   tobacco info given 03/17/16       Objective:     Wt Readings from Last 3 Encounters:  11/30/18 284 lb (128.8 kg)  08/03/18 288 lb (130.6 kg)  03/29/18 294 lb (133.4 kg)     Exam deferred. Pt. Harboring due to COVID 19. Phone  visit performed.   Assessment & Plan:   1. Localized edema     Meds ordered this encounter  Medications  . furosemide (LASIX) 40 MG tablet    Sig: Take 2 tablets (80 mg total) by mouth 2 (two) times daily for 10 days.    Dispense:  40 tablet    Refill:  0    No orders of the defined types were placed in this encounter.     Diagnoses and all orders for this visit:  Localized edema  Other orders -     furosemide (LASIX) 40 MG tablet; Take 2 tablets (80 mg total) by mouth 2 (two) times daily for 10 days.    Virtual Visit via telephone Note  I discussed the limitations, risks, security and privacy concerns of performing an evaluation and management service by telephone and the availability of in person appointments. The patient was identified with two identifiers. Pt.expressed understanding and agreed to proceed. Pt. Is at home. Dr. Livia Snellen is in his office.  Follow Up Instructions:   I discussed the assessment and treatment plan with the patient. The patient was provided an opportunity to ask questions and all were answered. The patient agreed with the plan and demonstrated  an understanding of the instructions.   The patient was advised to call back or seek an in-person evaluation if the symptoms worsen or if the condition fails to improve as anticipated.   Total minutes including chart review and phone contact time: 14   Follow up plan: Return in about 9 days (around 08/09/2019) for edema.  [name], MD [place]

## 2019-08-01 ENCOUNTER — Other Ambulatory Visit: Payer: Self-pay | Admitting: Family Medicine

## 2019-08-01 DIAGNOSIS — M25511 Pain in right shoulder: Secondary | ICD-10-CM

## 2019-08-02 ENCOUNTER — Other Ambulatory Visit: Payer: Medicare HMO

## 2019-08-02 ENCOUNTER — Other Ambulatory Visit: Payer: Self-pay

## 2019-08-02 DIAGNOSIS — I10 Essential (primary) hypertension: Secondary | ICD-10-CM | POA: Diagnosis not present

## 2019-08-03 LAB — HEPATIC FUNCTION PANEL
ALT: 65 IU/L — ABNORMAL HIGH (ref 0–44)
AST: 30 IU/L (ref 0–40)
Albumin: 4.4 g/dL (ref 3.8–4.8)
Alkaline Phosphatase: 50 IU/L (ref 39–117)
Bilirubin Total: 0.3 mg/dL (ref 0.0–1.2)
Bilirubin, Direct: 0.09 mg/dL (ref 0.00–0.40)
Total Protein: 6.8 g/dL (ref 6.0–8.5)

## 2019-08-05 NOTE — Progress Notes (Signed)
Hello Deni,  Your lab result is normal and/or stable.Some minor variations that are not significant are commonly marked abnormal, but do not represent any medical problem for you.  Best regards, Zadkiel Dragan, M.D.

## 2019-08-08 ENCOUNTER — Other Ambulatory Visit: Payer: Self-pay

## 2019-08-08 NOTE — Telephone Encounter (Signed)
Patient seen since telephone encounter.  This encounter will now be closed. 

## 2019-08-09 ENCOUNTER — Ambulatory Visit (INDEPENDENT_AMBULATORY_CARE_PROVIDER_SITE_OTHER): Payer: Medicare HMO

## 2019-08-09 ENCOUNTER — Encounter: Payer: Self-pay | Admitting: Family Medicine

## 2019-08-09 ENCOUNTER — Ambulatory Visit (INDEPENDENT_AMBULATORY_CARE_PROVIDER_SITE_OTHER): Payer: Medicare HMO | Admitting: Family Medicine

## 2019-08-09 VITALS — BP 128/75 | HR 87 | Temp 99.1°F | Ht 69.0 in | Wt 316.0 lb

## 2019-08-09 DIAGNOSIS — J449 Chronic obstructive pulmonary disease, unspecified: Secondary | ICD-10-CM | POA: Diagnosis not present

## 2019-08-09 DIAGNOSIS — R609 Edema, unspecified: Secondary | ICD-10-CM | POA: Diagnosis not present

## 2019-08-09 DIAGNOSIS — R918 Other nonspecific abnormal finding of lung field: Secondary | ICD-10-CM | POA: Diagnosis not present

## 2019-08-09 DIAGNOSIS — R0600 Dyspnea, unspecified: Secondary | ICD-10-CM

## 2019-08-09 DIAGNOSIS — R600 Localized edema: Secondary | ICD-10-CM | POA: Diagnosis not present

## 2019-08-09 MED ORDER — BUMETANIDE 2 MG PO TABS: 2.0000 mg | ORAL_TABLET | Freq: Two times a day (BID) | ORAL | 0 refills | Status: DC

## 2019-08-09 NOTE — Progress Notes (Signed)
Subjective:  Patient ID: Juan Merritt, male    DOB: 1951-03-28  Age: 68 y.o. MRN: [id number]  CC: Leg Swelling   HPI Juan Merritt presents for increasing swelling in the legs with dyspnea for walking over 100 feet, level as well as swelling of the abdomen. Juan Merritt has had no cough or fever. No rash or abd pain.  Current fluid med was increased by phone discussion last week but has been ineffective. Wt. Gain has been 32 lbs.   Depression screen [facility] 2/9 08/09/2019 11/30/2018 08/03/2018  Decreased Interest 0 0 0  Down, Depressed, Hopeless 0 0 0  PHQ - 2 Score 0 0 0  Altered sleeping 0 0 -  Tired, decreased energy 0 0 -  Change in appetite 0 0 -  Feeling bad or failure about yourself  0 0 -  Trouble concentrating 0 0 -  Moving slowly or fidgety/restless 0 0 -  Suicidal thoughts 0 0 -  PHQ-9 Score 0 0 -  Difficult doing work/chores - Not difficult at all -    History Juan Merritt has a past medical history of Arthritis, COPD (chronic obstructive pulmonary disease) ([place]), Gallstones, Gout, Hypertension, Obesity, and Pneumonia.   Juan Merritt has a past surgical history that includes Cholecystectomy and Tonsillectomy.   His family history includes Asthma in his mother; Congestive Heart Failure in his mother; Diabetes in his father; Esophageal cancer in his paternal uncle; Heart attack in an other family member; Heart disease in his mother; Leukemia in his brother; Lung disease in an other family member.Juan Merritt reports that Juan Merritt quit smoking about 2 years ago. His smoking use included cigarettes. Juan Merritt has a 10.00 pack-year smoking history. His smokeless tobacco use includes chew. Juan Merritt reports current alcohol use of about 10.0 standard drinks of alcohol per week. Juan Merritt reports that Juan Merritt does not use drugs.    ROS Review of Systems  Constitutional: Negative.   HENT: Negative.   Eyes: Negative for visual disturbance.  Respiratory: Positive for shortness of breath. Negative for cough.   Cardiovascular: Positive for  leg swelling. Negative for chest pain.  Gastrointestinal: Positive for abdominal distention. Negative for abdominal pain, diarrhea, nausea and vomiting.  Genitourinary: Negative for difficulty urinating.  Musculoskeletal: Negative for arthralgias and myalgias.  Skin: Negative for rash.  Neurological: Negative for headaches.  Psychiatric/Behavioral: Negative for sleep disturbance.    Objective:  BP 128/75   Pulse 87   Temp 99.1 F (37.3 C) (Temporal)   Ht _0  (1.753 m)   Wt (!) 316 lb (143.3 kg)   BMI 46.67 kg/m   BP Readings from Last 3 Encounters:  08/09/19 128/75  11/30/18 131/79  08/03/18 108/80    Wt Readings from Last 3 Encounters:  08/09/19 (!) 316 lb (143.3 kg)  11/30/18 284 lb (128.8 kg)  08/03/18 288 lb (130.6 kg)     Physical Exam Constitutional:      General: Juan Merritt is not in acute distress.    Appearance: Juan Merritt is well-developed.  HENT:     Head: Normocephalic and atraumatic.     Right Ear: External ear normal.     Left Ear: External ear normal.     Nose: Nose normal.  Eyes:     Conjunctiva/sclera: Conjunctivae normal.     Pupils: Pupils are equal, round, and reactive to light.  Neck:     Musculoskeletal: Normal range of motion and neck supple.  Cardiovascular:     Rate and Rhythm: Normal rate and regular rhythm.  Heart sounds: Normal heart sounds. No murmur.  Pulmonary:     Effort: Pulmonary effort is normal. No respiratory distress.     Breath sounds: Normal breath sounds. No wheezing or rales.  Abdominal:     Palpations: Abdomen is soft.     Tenderness: There is no abdominal tenderness.  Musculoskeletal: Normal range of motion.        General: Swelling present.     Right lower leg: Edema present.     Left lower leg: Edema present.  Skin:    General: Skin is warm and dry.     Findings: No lesion.  Neurological:     Mental Status: Juan Merritt is alert and oriented to person, place, and time.     Deep Tendon Reflexes: Reflexes are normal and symmetric.   Psychiatric:        Behavior: Behavior normal.        Thought Content: Thought content normal.        Judgment: Judgment normal.       Assessment & Plan:   Juan Merritt was seen today for leg swelling.  Diagnoses and all orders for this visit:  Edema, unspecified type -     CMP14+EGFR -     Brain natriuretic peptide  Dyspnea, unspecified type -     DG Chest 2 View; Future  Other orders -     bumetanide (BUMEX) 2 MG tablet; Take 1 tablet (2 mg total) by mouth 2 (two) times daily.       I have discontinued Shemar Plemmons. Muecke's traMADol and furosemide. I am also having him start on bumetanide. Additionally, I am having him maintain his albuterol, losartan, spironolactone, allopurinol, amLODipine, potassium chloride SA, budesonide-formoterol, and meloxicam.  Allergies as of 08/09/2019   No Known Allergies     Medication List       Accurate as of August 09, 2019 11:59 PM. If you have any questions, ask your nurse or doctor.        STOP taking these medications   furosemide 40 MG tablet Commonly known as: LASIX Stopped by: [name], MD   traMADol 50 MG tablet Commonly known as: ULTRAM Stopped by: [name], MD     TAKE these medications   albuterol 108 (90 Base) MCG/ACT inhaler Commonly known as: VENTOLIN HFA Inhale 2 puffs into the lungs 2 (two) times daily. What changed: Another medication with the same name was removed. Continue taking this medication, and follow the directions you see here. Changed by: [name], MD   allopurinol 100 MG tablet Commonly known as: ZYLOPRIM TAKE 1 TABLET (100 MG TOTAL) BY MOUTH DAILY. TO PREVENT GOUT   amLODipine 5 MG tablet Commonly known as: NORVASC TAKE 1 TABLET BY MOUTH EVERY DAY (Needs to be seen before next refill)   budesonide-formoterol 160-4.5 MCG/ACT inhaler Commonly known as: Symbicort TAKE 2 PUFFS BY MOUTH TWICE A DAY   bumetanide 2 MG tablet Commonly known as: Bumex Take 1 tablet (2 mg total)  by mouth 2 (two) times daily. Started by: [name], MD   losartan 100 MG tablet Commonly known as: COZAAR TAKE 1 TABLET BY MOUTH EVERY DAY   meloxicam 15 MG tablet Commonly known as: MOBIC TAKE 1 TABLET BY MOUTH EVERY DAY   potassium chloride SA 20 MEQ tablet Commonly known as: Klor-Con M20 TAKE 1 TABLET (20 MEQ TOTAL) DAILY BY MOUTH.   spironolactone 25 MG tablet Commonly known as: ALDACTONE TAKE 1 TABLET BY MOUTH EVERY DAY  Follow-up: Return in about 1 week (around 08/16/2019).  [name], M.D.

## 2019-08-10 LAB — CMP14+EGFR
ALT: 67 IU/L — ABNORMAL HIGH (ref 0–44)
AST: 29 IU/L (ref 0–40)
Albumin/Globulin Ratio: 1.5 (ref 1.2–2.2)
Albumin: 4.3 g/dL (ref 3.8–4.8)
Alkaline Phosphatase: 49 IU/L (ref 39–117)
BUN/Creatinine Ratio: 20 (ref 10–24)
BUN: 22 mg/dL (ref 8–27)
Bilirubin Total: 0.3 mg/dL (ref 0.0–1.2)
CO2: 27 mmol/L (ref 20–29)
Calcium: 9.3 mg/dL (ref 8.6–10.2)
Chloride: 99 mmol/L (ref 96–106)
Creatinine, Ser: 1.12 mg/dL (ref 0.76–1.27)
GFR calc Af Amer: 79 mL/min/{1.73_m2} (ref >59)
GFR calc non Af Amer: 68 mL/min/{1.73_m2} (ref >59)
Globulin, Total: 2.9 g/dL (ref 1.5–4.5)
Glucose: 136 mg/dL — ABNORMAL HIGH (ref 65–99)
Potassium: 4.4 mmol/L (ref 3.5–5.2)
Sodium: 140 mmol/L (ref 134–144)
Total Protein: 7.2 g/dL (ref 6.0–8.5)

## 2019-08-10 LAB — BRAIN NATRIURETIC PEPTIDE: BNP: 11.4 pg/mL (ref 0.0–100.0)

## 2019-08-12 NOTE — Progress Notes (Signed)
Hello [name],  Your lab result is normal and/or stable. There was a mild elevation of glucose and one of the liver enzymes. Both of these are trivial.  Best regards, [name], M.D.

## 2019-08-14 ENCOUNTER — Ambulatory Visit (INDEPENDENT_AMBULATORY_CARE_PROVIDER_SITE_OTHER): Payer: Medicare HMO | Admitting: *Deleted

## 2019-08-14 DIAGNOSIS — Z0000 Encounter for general adult medical examination without abnormal findings: Secondary | ICD-10-CM

## 2019-08-14 NOTE — Progress Notes (Signed)
MEDICARE ANNUAL WELLNESS VISIT  08/14/2019  Telephone Visit Disclaimer This Medicare AWV was conducted by telephone due to national recommendations for restrictions regarding the COVID-19 Pandemic (e.g. social distancing).  I verified, using two identifiers, that I am speaking with Juan Merritt or their authorized healthcare agent. I discussed the limitations, risks, security, and privacy concerns of performing an evaluation and management service by telephone and the potential availability of an in-person appointment in the future. The patient expressed understanding and agreed to proceed.   Subjective:  Juan Merritt is a 68 y.o. male patient of Stacks, Cletus Gash, MD who had a Medicare Annual Wellness Visit today via telephone. Juan Merritt is Retired and lives with their spouse. he has 5 children. he reports that he is socially active and does interact with friends/family regularly. he is not physically active and enjoys fishing and watching NASCAR.  Patient Care Team: [name], MD as PCP - General (Family Medicine)  Advanced Directives 08/14/2019 03/30/2018 07/20/2015 03/23/2015  Does Patient Have a Medical Advance Directive? No Yes No No  Type of Advance Directive - Living will;Healthcare Power of Attorney - -  Does patient want to make changes to medical advance directive? - No - Patient declined - -  Copy of [place] in Chart? - No - copy requested - -  Would patient like information on creating a medical advance directive? No - Patient declined - - No - patient declined information    Hospital Utilization Over the Past 12 Months: # of hospitalizations or ER visits: 0 # of surgeries: 0  Review of Systems    Patient reports that his overall health is worse compared to last year.  Patient Reported Readings (BP, Pulse, CBG, Weight, etc) none  Review of Systems: History obtained from chart review  All other systems negative.  Pain Assessment Pain :  No/denies pain     Current Medications & Allergies (verified) Allergies as of 08/14/2019   No Known Allergies     Medication List       Accurate as of August 14, 2019  8:51 AM. If you have any questions, ask your nurse or doctor.        STOP taking these medications   spironolactone 25 MG tablet Commonly known as: ALDACTONE     TAKE these medications   albuterol 108 (90 Base) MCG/ACT inhaler Commonly known as: VENTOLIN HFA Inhale 2 puffs into the lungs 2 (two) times daily.   allopurinol 100 MG tablet Commonly known as: ZYLOPRIM TAKE 1 TABLET (100 MG TOTAL) BY MOUTH DAILY. TO PREVENT GOUT   amLODipine 5 MG tablet Commonly known as: NORVASC TAKE 1 TABLET BY MOUTH EVERY DAY (Needs to be seen before next refill)   budesonide-formoterol 160-4.5 MCG/ACT inhaler Commonly known as: Symbicort TAKE 2 PUFFS BY MOUTH TWICE A DAY   bumetanide 2 MG tablet Commonly known as: Bumex Take 1 tablet (2 mg total) by mouth 2 (two) times daily.   losartan 100 MG tablet Commonly known as: COZAAR TAKE 1 TABLET BY MOUTH EVERY DAY   meloxicam 15 MG tablet Commonly known as: MOBIC TAKE 1 TABLET BY MOUTH EVERY DAY   potassium chloride SA 20 MEQ tablet Commonly known as: Klor-Con M20 TAKE 1 TABLET (20 MEQ TOTAL) DAILY BY MOUTH.       History (reviewed): Past Medical History:  Diagnosis Date  . Arthritis   . COPD (chronic obstructive pulmonary disease) ([facility])    not on home o2  .  Gallstones   . Gout   . Hypertension   . Obesity   . Pneumonia    Past Surgical History:  Procedure Laterality Date  . CHOLECYSTECTOMY    . TONSILLECTOMY     Family History  Problem Relation Age of Onset  . Asthma Mother   . Heart disease Mother   . Congestive Heart Failure Mother   . Diabetes Father   . Esophageal cancer Paternal Uncle   . Lung disease Other        pat cousin  . Leukemia Brother   . Heart attack Other        pat cousin  . Colon cancer Neg Hx    Social History    Socioeconomic History  . Marital status: Married    Spouse name: [name]  . Number of children: 5  . Years of education: 10  . Highest education level: 10th grade  Occupational History  . Occupation: retired Investment banker, operational  . Financial resource strain: Not hard at all  . Food insecurity    Worry: Never true    Inability: Never true  . Transportation needs    Medical: No    Non-medical: No  Tobacco Use  . Smoking status: Former Smoker    Packs/day: 0.25    Years: 40.00    Pack years: 10.00    Types: Cigarettes    Quit date: 01/15/2017    Years since quitting: 2.5  . Smokeless tobacco: Current User    Types: Chew  . Tobacco comment: tobacco info given 03/17/16  Substance and Sexual Activity  . Alcohol use: Not Currently  . Drug use: No  . Sexual activity: Not Currently  Lifestyle  . Physical activity    Days per week: 0 days    Minutes per session: 0 min  . Stress: Not at all  Relationships  . Social connections    Talks on phone: More than three times a week    Gets together: Three times a week    Attends religious service: Never    Active member of club or organization: No    Attends meetings of clubs or organizations: Never    Relationship status: Married  Other Topics Concern  . Not on file  Social History Narrative  . Not on file    Activities of Daily Living In your present state of health, do you have any difficulty performing the following activities: 08/14/2019  Hearing? Y  Comment can't hear as good as he used to-hasn't been evaluated by specialist  Vision? N  Difficulty concentrating or making decisions? N  Walking or climbing stairs? N  Dressing or bathing? N  Doing errands, shopping? N  Preparing Food and eating ? N  Using the Toilet? N  In the past six months, have you accidently leaked urine? N  Do you have problems with loss of bowel control? N  Managing your Medications? N  Managing your Finances? N  Housekeeping or managing your  Housekeeping? N  Some recent data might be hidden    Patient Education/ Literacy How often do you need to have someone help you when you read instructions, pamphlets, or other written materials from your doctor or pharmacy?: 1 - Never What is the last grade level you completed in school?: 10th grade  Exercise Current Exercise Habits: The patient does not participate in regular exercise at present, Exercise limited by: respiratory conditions(s)  Diet Patient reports consuming 2 meals a day and 1 snack(s)  a day Patient reports that his primary diet is: Regular Patient reports that she does have regular access to food.   Depression Screen PHQ 2/9 Scores 08/14/2019 08/09/2019 11/30/2018 08/03/2018 01/30/2018 10/27/2017 07/31/2017  PHQ - 2 Score 0 0 0 0 0 0 0  PHQ- 9 Score - 0 0 - - - -     Fall Risk Fall Risk  08/14/2019 08/09/2019 11/30/2018 08/03/2018 07/31/2017  Falls in the past year? 0 0 0 No No  Number falls in past yr: 0 - - - -  Injury with Fall? 0 - - - -  Follow up Falls prevention discussed - - - -  Comment getting rid of all throw rugs, adequate lighting in the walkways and grabrails in the bathroom - - - -     Objective:  Juan Merritt seemed alert and oriented and he participated appropriately during our telephone visit.  Blood Pressure Weight BMI  BP Readings from Last 3 Encounters:  08/09/19 128/75  11/30/18 131/79  08/03/18 108/80   Wt Readings from Last 3 Encounters:  08/09/19 (!) 316 lb (143.3 kg)  11/30/18 284 lb (128.8 kg)  08/03/18 288 lb (130.6 kg)   BMI Readings from Last 1 Encounters:  08/09/19 46.67 kg/m    *Unable to obtain current vital signs, weight, and BMI due to telephone visit type  Hearing/Vision  . Fyodor did not seem to have difficulty with hearing/understanding during the telephone conversation . Reports that he has not had a formal eye exam by an eye care professional within the past year . Reports that he has not had a formal hearing  evaluation within the past year *Unable to fully assess hearing and vision during telephone visit type  Cognitive Function: 6CIT Screen 08/14/2019  What Year? 0 points  What month? 0 points  What time? 0 points  Count back from 20 0 points  Months in reverse 2 points  Repeat phrase 2 points  Total Score 4   (Normal:0-7, Significant for Dysfunction: >8)  Normal Cognitive Function Screening: Yes   Immunization & Health Maintenance Record Immunization History  Administered Date(s) Administered  . Influenza,inj,Quad PF,6+ Mos 10/14/2016  . Pneumococcal Polysaccharide-23 09/07/2015    Health Maintenance  Topic Date Due  . FOOT EXAM  07/21/1963  . OPHTHALMOLOGY EXAM  07/21/1963  . TETANUS/TDAP  07/20/1972  . COLONOSCOPY  07/21/2003  . PNA vac Low Risk Adult (1 of 2 - PCV13) 07/20/2018  . HEMOGLOBIN A1C  06/01/2019  . INFLUENZA VACCINE  07/20/2019  . Hepatitis C Screening  Completed       Assessment  This is a routine wellness examination for Juan Merritt.  Health Maintenance: Due or Overdue Health Maintenance Due  Topic Date Due  . FOOT EXAM  07/21/1963  . OPHTHALMOLOGY EXAM  07/21/1963  . TETANUS/TDAP  07/20/1972  . COLONOSCOPY  07/21/2003  . PNA vac Low Risk Adult (1 of 2 - PCV13) 07/20/2018  . HEMOGLOBIN A1C  06/01/2019  . INFLUENZA VACCINE  07/20/2019    Juan Merritt does not need a referral for Community Assistance: Care Management:   no Social Work:    no Prescription Assistance:  no Nutrition/Diabetes Education:  no   Plan:  Personalized Goals Goals Addressed            This Visit's Progress   . DIET - INCREASE WATER INTAKE       Try to drink 6-8 glasses of water daily.      Personalized  Health Maintenance & Screening Recommendations  Pneumococcal vaccine  Influenza vaccine Td vaccine Colorectal cancer screening Shingles vaccine  Lung Cancer Screening Recommended: no (Low Dose CT Chest recommended if Age 61-80 years, 30  pack-year currently smoking OR have quit w/in past 15 years) Hepatitis C Screening recommended: no HIV Screening recommended: no  Advanced Directives: Written information was not prepared per patient's request.  Referrals & Orders No orders of the defined types were placed in this encounter.   Follow-up Plan . Follow-up with [name], MD as planned . Consider doing the Colonoscopy as discussed . Consider Prevnar13, TDAP, FLU and Shingles vaccines at your next visit with your PCP   I have personally reviewed and noted the following in the patient's chart:   . Medical and social history . Use of alcohol, tobacco or illicit drugs  . Current medications and supplements . Functional ability and status . Nutritional status . Physical activity . Advanced directives . List of other physicians . Hospitalizations, surgeries, and ER visits in previous 12 months . Vitals . Screenings to include cognitive, depression, and falls . Referrals and appointments  In addition, I have reviewed and discussed with Juan Merritt Carbajal certain preventive protocols, quality metrics, and best practice recommendations. A written personalized care plan for preventive services as well as general preventive health recommendations is available and can be mailed to the patient at his request.      Marylin Crosby, LPN  [phone]

## 2019-08-14 NOTE — Patient Instructions (Signed)
Preventive Care 75 Years and Older, Male Preventive care refers to lifestyle choices and visits with your health care provider that can promote health and wellness. This includes:  A yearly physical exam. This is also called an annual well check.  Regular dental and eye exams.  Immunizations.  Screening for certain conditions.  Healthy lifestyle choices, such as diet and exercise. What can I expect for my preventive care visit? Physical exam Your health care provider will check:  Height and weight. These may be used to calculate body mass index (BMI), which is a measurement that tells if you are at a healthy weight.  Heart rate and blood pressure.  Your skin for abnormal spots. Counseling Your health care provider may ask you questions about:  Alcohol, tobacco, and drug use.  Emotional well-being.  Home and relationship well-being.  Sexual activity.  Eating habits.  History of falls.  Memory and ability to understand (cognition).  Work and work Statistician. What immunizations do I need?  Influenza (flu) vaccine  This is recommended every year. Tetanus, diphtheria, and pertussis (Tdap) vaccine  You may need a Td booster every 10 years. Varicella (chickenpox) vaccine  You may need this vaccine if you have not already been vaccinated. Zoster (shingles) vaccine  You may need this after age 50. Pneumococcal conjugate (PCV13) vaccine  One dose is recommended after age 24. Pneumococcal polysaccharide (PPSV23) vaccine  One dose is recommended after age 33. Measles, mumps, and rubella (MMR) vaccine  You may need at least one dose of MMR if you were born in 1957 or later. You may also need a second dose. Meningococcal conjugate (MenACWY) vaccine  You may need this if you have certain conditions. Hepatitis A vaccine  You may need this if you have certain conditions or if you travel or work in places where you may be exposed to hepatitis A. Hepatitis B vaccine   You may need this if you have certain conditions or if you travel or work in places where you may be exposed to hepatitis B. Haemophilus influenzae type b (Hib) vaccine  You may need this if you have certain conditions. You may receive vaccines as individual doses or as more than one vaccine together in one shot (combination vaccines). Talk with your health care provider about the risks and benefits of combination vaccines. What tests do I need? Blood tests  Lipid and cholesterol levels. These may be checked every 5 years, or more frequently depending on your overall health.  Hepatitis C test.  Hepatitis B test. Screening  Lung cancer screening. You may have this screening every year starting at age 74 if you have a 30-pack-year history of smoking and currently smoke or have quit within the past 15 years.  Colorectal cancer screening. All adults should have this screening starting at age 57 and continuing until age 54. Your health care provider may recommend screening at age 47 if you are at increased risk. You will have tests every 1-10 years, depending on your results and the type of screening test.  Prostate cancer screening. Recommendations will vary depending on your family history and other risks.  Diabetes screening. This is done by checking your blood sugar (glucose) after you have not eaten for a while (fasting). You may have this done every 1-3 years.  Abdominal aortic aneurysm (AAA) screening. You may need this if you are a current or former smoker.  Sexually transmitted disease (STD) testing. Follow these instructions at home: Eating and drinking  Eat  a diet that includes fresh fruits and vegetables, whole grains, lean protein, and low-fat dairy products. Limit your intake of foods with high amounts of sugar, saturated fats, and salt.  Take vitamin and mineral supplements as recommended by your health care provider.  Do not drink alcohol if your health care provider  tells you not to drink.  If you drink alcohol: ? Limit how much you have to 0-2 drinks a day. ? Be aware of how much alcohol is in your drink. In the [place] one drink equals one 12 oz bottle of beer (355 mL), one 5 oz glass of wine (148 mL), or one 1 oz glass of hard liquor (44 mL). Lifestyle  Take daily care of your teeth and gums.  Stay active. Exercise for at least 30 minutes on 5 or more days each week.  Do not use any products that contain nicotine or tobacco, such as cigarettes, e-cigarettes, and chewing tobacco. If you need help quitting, ask your health care provider.  If you are sexually active, practice safe sex. Use a condom or other form of protection to prevent STIs (sexually transmitted infections).  Talk with your health care provider about taking a low-dose aspirin or statin. What's next?  Visit your health care provider once a year for a well check visit.  Ask your health care provider how often you should have your eyes and teeth checked.  Stay up to date on all vaccines. This information is not intended to replace advice given to you by your health care provider. Make sure you discuss any questions you have with your health care provider. Document Released: 01/01/2016 Document Revised: 11/29/2018 Document Reviewed: 11/29/2018 Elsevier Patient Education  2020 [employer]  

## 2019-08-20 ENCOUNTER — Other Ambulatory Visit: Payer: Self-pay

## 2019-08-21 ENCOUNTER — Encounter: Payer: Self-pay | Admitting: Family Medicine

## 2019-08-21 ENCOUNTER — Ambulatory Visit (INDEPENDENT_AMBULATORY_CARE_PROVIDER_SITE_OTHER): Payer: Medicare HMO | Admitting: Family Medicine

## 2019-08-21 VITALS — BP 116/77 | HR 92 | Temp 99.3°F | Ht 69.0 in | Wt 309.0 lb

## 2019-08-21 DIAGNOSIS — R601 Generalized edema: Secondary | ICD-10-CM

## 2019-08-21 NOTE — Patient Instructions (Signed)
Edema  Edema is when you have too much fluid in your body or under your skin. Edema may make your legs, feet, and ankles swell up. Swelling is also common in looser tissues, like around your eyes. This is a common condition. It gets more common as you get older. There are many possible causes of edema. Eating too much salt (sodium) and being on your feet or sitting for a long time can cause edema in your legs, feet, and ankles. Hot weather may make edema worse. Edema is usually painless. Your skin may look swollen or shiny. Follow these instructions at home:  Keep the swollen body part raised (elevated) above the level of your heart when you are sitting or lying down.  Do not sit still or stand for a long time.  Do not wear tight clothes. Do not wear garters on your upper legs.  Exercise your legs. This can help the swelling go down.  Wear elastic bandages or support stockings as told by your doctor.  Eat a low-salt (low-sodium) diet to reduce fluid as told by your doctor.  Depending on the cause of your swelling, you may need to limit how much fluid you drink (fluid restriction).  Take over-the-counter and prescription medicines only as told by your doctor. Contact a doctor if:  Treatment is not working.  You have heart, liver, or kidney disease and have symptoms of edema.  You have sudden and unexplained weight gain. Get help right away if:  You have shortness of breath or chest pain.  You cannot breathe when you lie down.  You have pain, redness, or warmth in the swollen areas.  You have heart, liver, or kidney disease and get edema all of a sudden.  You have a fever and your symptoms get worse all of a sudden. Summary  Edema is when you have too much fluid in your body or under your skin.  Edema may make your legs, feet, and ankles swell up. Swelling is also common in looser tissues, like around your eyes.  Raise (elevate) the swollen body part above the level of your  heart when you are sitting or lying down.  Follow your doctor's instructions about diet and how much fluid you can drink (fluid restriction). This information is not intended to replace advice given to you by your health care provider. Make sure you discuss any questions you have with your health care provider. Document Released: 05/23/2008 Document Revised: 12/08/2017 Document Reviewed: 12/23/2016 Elsevier Patient Education  2020 [employer] Inc[employer]  

## 2019-08-22 LAB — BMP8+EGFR
BUN/Creatinine Ratio: 23 (ref 10–24)
BUN: 23 mg/dL (ref 8–27)
CO2: 25 mmol/L (ref 20–29)
Calcium: 9.8 mg/dL (ref 8.6–10.2)
Chloride: 98 mmol/L (ref 96–106)
Creatinine, Ser: 1 mg/dL (ref 0.76–1.27)
GFR calc Af Amer: 90 mL/min/{1.73_m2} (ref >59)
GFR calc non Af Amer: 78 mL/min/{1.73_m2} (ref >59)
Glucose: 133 mg/dL — ABNORMAL HIGH (ref 65–99)
Potassium: 4.2 mmol/L (ref 3.5–5.2)
Sodium: 138 mmol/L (ref 134–144)

## 2019-08-22 NOTE — Progress Notes (Signed)
Hello Rufino,  Your lab result is normal and/or stable.Some minor variations that are not significant are commonly marked abnormal, but do not represent any medical problem for you.  Best regards, Erie Radu, M.D.

## 2019-08-23 ENCOUNTER — Encounter: Payer: Self-pay | Admitting: Family Medicine

## 2019-08-23 NOTE — Progress Notes (Signed)
Subjective:  Patient ID: Juan Merritt, male    DOB: 1951/05/02  Age: 68 y.o. MRN: [id number]  CC: 2 Week Follow-up   HPI Juan Merritt presents for swelling in the legs. Breathing is better. Still has DOE, moderate edema. Has lost 7 lbs.since last visit.   Depression screen [facility] 2/9 08/21/2019 08/14/2019 08/09/2019  Decreased Interest 0 0 0  Down, Depressed, Hopeless 0 0 0  PHQ - 2 Score 0 0 0  Altered sleeping - - 0  Tired, decreased energy - - 0  Change in appetite - - 0  Feeling bad or failure about yourself  - - 0  Trouble concentrating - - 0  Moving slowly or fidgety/restless - - 0  Suicidal thoughts - - 0  PHQ-9 Score - - 0  Difficult doing work/chores - - -    History Juan Merritt has a past medical history of Arthritis, COPD (chronic obstructive pulmonary disease) ([place]), Gallstones, Gout, Hypertension, Obesity, and Pneumonia.   Juan Merritt has a past surgical history that includes Cholecystectomy and Tonsillectomy.   His family history includes Asthma in his mother; Congestive Heart Failure in his mother; Diabetes in his father; Esophageal cancer in his paternal uncle; Heart attack in an other family member; Heart disease in his mother; Leukemia in his brother; Lung disease in an other family member.Juan Merritt reports that Juan Merritt quit smoking about 2 years ago. His smoking use included cigarettes. Juan Merritt has a 10.00 pack-year smoking history. His smokeless tobacco use includes chew. Juan Merritt reports previous alcohol use. Juan Merritt reports that Juan Merritt does not use drugs.    ROS Review of Systems  Constitutional: Negative for fever.  Respiratory: Positive for shortness of breath.   Cardiovascular: Positive for leg swelling. Negative for chest pain.  Musculoskeletal: Negative for arthralgias.  Skin: Negative for rash.    Objective:  BP 116/77   Pulse 92   Temp 99.3 F (37.4 C) (Oral)   Ht '5\' 9"'  (1.753 m)   Wt (!) 309 lb (140.2 kg)   BMI 45.63 kg/m   BP Readings from Last 3 Encounters:  08/21/19 116/77   08/09/19 128/75  11/30/18 131/79    Wt Readings from Last 3 Encounters:  08/21/19 (!) 309 lb (140.2 kg)  08/09/19 (!) 316 lb (143.3 kg)  11/30/18 284 lb (128.8 kg)     Physical Exam Vitals signs reviewed.  Constitutional:      Appearance: Juan Merritt is well-developed.  HENT:     Head: Normocephalic and atraumatic.     Right Ear: External ear normal.     Left Ear: External ear normal.     Mouth/Throat:     Pharynx: No oropharyngeal exudate or posterior oropharyngeal erythema.  Eyes:     Pupils: Pupils are equal, round, and reactive to light.  Neck:     Musculoskeletal: Normal range of motion and neck supple.  Cardiovascular:     Rate and Rhythm: Normal rate and regular rhythm.     Heart sounds: No murmur.  Pulmonary:     Effort: No respiratory distress.     Breath sounds: Normal breath sounds.  Musculoskeletal:        General: Swelling present.  Neurological:     Mental Status: Juan Merritt is alert and oriented to person, place, and time.       Assessment & Plan:   Juan Merritt was seen today for 2 week follow-up.  Diagnoses and all orders for this visit:  Generalized edema -     BMP8+EGFR  I am having Juan Merritt. Juan Merritt maintain his albuterol, losartan, allopurinol, amLODipine, potassium chloride SA, budesonide-formoterol, meloxicam, and bumetanide.  Allergies as of 08/21/2019   No Known Allergies     Medication List       Accurate as of August 21, 2019 11:59 PM. If you have any questions, ask your nurse or doctor.        albuterol 108 (90 Base) MCG/ACT inhaler Commonly known as: VENTOLIN HFA Inhale 2 puffs into the lungs 2 (two) times daily.   allopurinol 100 MG tablet Commonly known as: ZYLOPRIM TAKE 1 TABLET (100 MG TOTAL) BY MOUTH DAILY. TO PREVENT GOUT   amLODipine 5 MG tablet Commonly known as: NORVASC TAKE 1 TABLET BY MOUTH EVERY DAY (Needs to be seen before next refill)   budesonide-formoterol 160-4.5 MCG/ACT inhaler Commonly known as: Symbicort  TAKE 2 PUFFS BY MOUTH TWICE A DAY   bumetanide 2 MG tablet Commonly known as: Bumex Take 1 tablet (2 mg total) by mouth 2 (two) times daily.   losartan 100 MG tablet Commonly known as: COZAAR TAKE 1 TABLET BY MOUTH EVERY DAY   meloxicam 15 MG tablet Commonly known as: MOBIC TAKE 1 TABLET BY MOUTH EVERY DAY   potassium chloride SA 20 MEQ tablet Commonly known as: Klor-Con M20 TAKE 1 TABLET (20 MEQ TOTAL) DAILY BY MOUTH.        Follow-up: Return in about 6 weeks (around 10/02/2019).  [name], M.D.

## 2019-08-29 ENCOUNTER — Telehealth: Payer: Self-pay | Admitting: Family Medicine

## 2019-08-29 NOTE — Telephone Encounter (Signed)
Please advise 

## 2019-08-30 ENCOUNTER — Telehealth: Payer: Self-pay | Admitting: Family Medicine

## 2019-08-30 ENCOUNTER — Other Ambulatory Visit: Payer: Self-pay | Admitting: Family Medicine

## 2019-09-01 ENCOUNTER — Other Ambulatory Visit: Payer: Self-pay | Admitting: Family Medicine

## 2019-09-01 DIAGNOSIS — M25511 Pain in right shoulder: Secondary | ICD-10-CM

## 2019-09-01 NOTE — Telephone Encounter (Signed)
Sent Bumex 3 times a day for the patient

## 2019-09-01 NOTE — Telephone Encounter (Signed)
Sent Bumex 3 times daily for the patient

## 2019-09-02 NOTE — Telephone Encounter (Signed)
Patient aware.

## 2019-09-20 ENCOUNTER — Other Ambulatory Visit: Payer: Self-pay

## 2019-09-23 ENCOUNTER — Encounter: Payer: Self-pay | Admitting: Family Medicine

## 2019-09-23 ENCOUNTER — Other Ambulatory Visit: Payer: Self-pay

## 2019-09-23 ENCOUNTER — Ambulatory Visit (INDEPENDENT_AMBULATORY_CARE_PROVIDER_SITE_OTHER): Payer: Medicare HMO

## 2019-09-23 ENCOUNTER — Ambulatory Visit (INDEPENDENT_AMBULATORY_CARE_PROVIDER_SITE_OTHER): Payer: Medicare HMO | Admitting: Family Medicine

## 2019-09-23 ENCOUNTER — Other Ambulatory Visit: Payer: Self-pay | Admitting: Family Medicine

## 2019-09-23 VITALS — BP 113/74 | HR 87 | Temp 98.2°F | Ht 69.0 in | Wt 311.0 lb

## 2019-09-23 DIAGNOSIS — I10 Essential (primary) hypertension: Secondary | ICD-10-CM

## 2019-09-23 DIAGNOSIS — R609 Edema, unspecified: Secondary | ICD-10-CM | POA: Diagnosis not present

## 2019-09-23 DIAGNOSIS — G8929 Other chronic pain: Secondary | ICD-10-CM

## 2019-09-23 DIAGNOSIS — M545 Low back pain, unspecified: Secondary | ICD-10-CM

## 2019-09-23 DIAGNOSIS — J449 Chronic obstructive pulmonary disease, unspecified: Secondary | ICD-10-CM

## 2019-09-23 DIAGNOSIS — M47817 Spondylosis without myelopathy or radiculopathy, lumbosacral region: Secondary | ICD-10-CM | POA: Diagnosis not present

## 2019-09-23 DIAGNOSIS — Z23 Encounter for immunization: Secondary | ICD-10-CM | POA: Diagnosis not present

## 2019-09-23 DIAGNOSIS — M4807 Spinal stenosis, lumbosacral region: Secondary | ICD-10-CM | POA: Diagnosis not present

## 2019-09-23 MED ORDER — LOSARTAN POTASSIUM 100 MG PO TABS: 100.0000 mg | ORAL_TABLET | Freq: Every day | ORAL | 0 refills | Status: DC

## 2019-09-23 MED ORDER — TORSEMIDE 100 MG PO TABS: 100.0000 mg | ORAL_TABLET | Freq: Two times a day (BID) | ORAL | 2 refills | Status: DC

## 2019-09-23 NOTE — Progress Notes (Signed)
Subjective:  Patient ID: Juan Merritt, male    DOB: 20-Sep-1951  Age: 68 y.o. MRN: [id number]  CC: Medical Management of Chronic Issues   HPI Juan Merritt presents for doing all he can to lose weight. Up two pounds today compared to last office visit. . Eating less. Unable to exercise. Can't walk due to back pain in spite of use of meloxicam. States dyspnea at baseline. However it interferes with exercising. He declines referral to pulmonology. He is concerned about the expense of the bumex.  He denies chest pain. Energy is fair.    presents for  follow-up of hypertension. Patient has no history of headache chest pain or shortness of breath or recent cough. Patient also denies symptoms of TIA such as focal numbness or weakness. Patient denies side effects from medication. States taking it regularly.    Depression screen [facility] 2/9 09/23/2019 08/21/2019 08/14/2019  Decreased Interest 0 0 0  Down, Depressed, Hopeless 0 0 0  PHQ - 2 Score 0 0 0  Altered sleeping - - -  Tired, decreased energy - - -  Change in appetite - - -  Feeling bad or failure about yourself  - - -  Trouble concentrating - - -  Moving slowly or fidgety/restless - - -  Suicidal thoughts - - -  PHQ-9 Score - - -  Difficult doing work/chores - - -    History Juan Merritt has a past medical history of Arthritis, COPD (chronic obstructive pulmonary disease) ([place]), Gallstones, Gout, Hypertension, Obesity, and Pneumonia.   He has a past surgical history that includes Cholecystectomy and Tonsillectomy.   His family history includes Asthma in his mother; Congestive Heart Failure in his mother; Diabetes in his father; Esophageal cancer in his paternal uncle; Heart attack in an other family member; Heart disease in his mother; Leukemia in his brother; Lung disease in an other family member.He reports that he quit smoking about 2 years ago. His smoking use included cigarettes. He has a 10.00 pack-year smoking history. His  smokeless tobacco use includes chew. He reports previous alcohol use. He reports that he does not use drugs.    ROS Review of Systems  Constitutional: Negative.  Negative for fever.  HENT: Negative.   Eyes: Negative for visual disturbance.  Respiratory: Positive for shortness of breath. Negative for cough.   Cardiovascular: Positive for leg swelling. Negative for chest pain.  Gastrointestinal: Negative for abdominal pain, diarrhea, nausea and vomiting.  Genitourinary: Negative for difficulty urinating.  Musculoskeletal: Positive for arthralgias and back pain. Negative for myalgias.  Skin: Negative for rash.  Neurological: Negative for headaches.  Psychiatric/Behavioral: Negative for sleep disturbance.    Objective:  BP 113/74   Pulse 87   Temp 98.2 F (36.8 C) (Oral)   Ht _0  (1.753 m)   Wt (!) 311 lb (141.1 kg)   SpO2 94%   BMI 45.93 kg/m   BP Readings from Last 3 Encounters:  09/23/19 113/74  08/21/19 116/77  08/09/19 128/75    Wt Readings from Last 3 Encounters:  09/23/19 (!) 311 lb (141.1 kg)  08/21/19 (!) 309 lb (140.2 kg)  08/09/19 (!) 316 lb (143.3 kg)     Physical Exam Constitutional:      General: He is not in acute distress.    Appearance: He is well-developed.  HENT:     Head: Normocephalic and atraumatic.     Right Ear: External ear normal.     Left Ear: External ear normal.  Nose: Nose normal.  Eyes:     Conjunctiva/sclera: Conjunctivae normal.     Pupils: Pupils are equal, round, and reactive to light.  Neck:     Musculoskeletal: Normal range of motion and neck supple.  Cardiovascular:     Rate and Rhythm: Normal rate and regular rhythm.     Heart sounds: Normal heart sounds. No murmur.  Pulmonary:     Effort: Pulmonary effort is normal. No respiratory distress.     Breath sounds: No wheezing, rhonchi or rales.     Comments: Diminished sounds  Abdominal:     Palpations: Abdomen is soft.     Tenderness: There is no abdominal  tenderness.  Musculoskeletal: Normal range of motion.  Skin:    General: Skin is warm and dry.  Neurological:     Mental Status: He is alert and oriented to person, place, and time.     Deep Tendon Reflexes: Reflexes are normal and symmetric.  Psychiatric:        Behavior: Behavior normal.        Thought Content: Thought content normal.        Judgment: Judgment normal.       Assessment & Plan:   Juan Merritt was seen today for medical management of chronic issues.  Diagnoses and all orders for this visit:  COPD mixed type ([place]) -     DG Chest 2 View; Future -     BMP8+EGFR  Essential hypertension, benign  Morbid obesity (HCC)  Chronic bilateral low back pain without sciatica -     DG Lumbar Spine 2-3 Views; Future  Edema, unspecified type -     Brain natriuretic peptide  Need for immunization against influenza -     Flu Vaccine QUAD High Dose(Fluad)  Other orders -     losartan (COZAAR) 100 MG tablet; Take 1 tablet (100 mg total) by mouth daily. -     torsemide (DEMADEX) 100 MG tablet; Take 1 tablet (100 mg total) by mouth 2 (two) times daily. On arising and at lunchtime       I have discontinued Floy Riegler. Gibbins's bumetanide. I have also changed his losartan. Additionally, I am having him start on torsemide. Lastly, I am having him maintain his albuterol, allopurinol, amLODipine, potassium chloride SA, budesonide-formoterol, and meloxicam.  Allergies as of 09/23/2019   No Known Allergies     Medication List       Accurate as of September 23, 2019  8:14 PM. If you have any questions, ask your nurse or doctor.        STOP taking these medications   bumetanide 2 MG tablet Commonly known as: [place] by: [name], MD     TAKE these medications   albuterol 108 (90 Base) MCG/ACT inhaler Commonly known as: VENTOLIN HFA Inhale 2 puffs into the lungs 2 (two) times daily.   allopurinol 100 MG tablet Commonly known as: ZYLOPRIM TAKE 1 TABLET (100 MG  TOTAL) BY MOUTH DAILY. TO PREVENT GOUT   amLODipine 5 MG tablet Commonly known as: NORVASC TAKE 1 TABLET BY MOUTH EVERY DAY (Needs to be seen before next refill)   budesonide-formoterol 160-4.5 MCG/ACT inhaler Commonly known as: Symbicort TAKE 2 PUFFS BY MOUTH TWICE A DAY   losartan 100 MG tablet Commonly known as: COZAAR Take 1 tablet (100 mg total) by mouth daily.   meloxicam 15 MG tablet Commonly known as: MOBIC TAKE 1 TABLET BY MOUTH EVERY DAY   potassium chloride SA 20 MEQ  tablet Commonly known as: Klor-Con M20 TAKE 1 TABLET (20 MEQ TOTAL) DAILY BY MOUTH.   torsemide 100 MG tablet Commonly known as: DEMADEX Take 1 tablet (100 mg total) by mouth 2 (two) times daily. On arising and at lunchtime Started by: [name], MD      Declined medical weight loss clinic referral and pulmonary referral.  Follow-up: Return in about 6 weeks (around 11/04/2019).  [name], M.D.

## 2019-09-23 NOTE — Progress Notes (Signed)
Your chest x-ray looked normal. Thanks, WS.

## 2019-09-24 LAB — BMP8+EGFR
BUN/Creatinine Ratio: 19 (ref 10–24)
BUN: 21 mg/dL (ref 8–27)
CO2: 26 mmol/L (ref 20–29)
Calcium: 9.7 mg/dL (ref 8.6–10.2)
Chloride: 98 mmol/L (ref 96–106)
Creatinine, Ser: 1.13 mg/dL (ref 0.76–1.27)
GFR calc Af Amer: 78 mL/min/{1.73_m2} (ref >59)
GFR calc non Af Amer: 67 mL/min/{1.73_m2} (ref >59)
Glucose: 149 mg/dL — ABNORMAL HIGH (ref 65–99)
Potassium: 4.1 mmol/L (ref 3.5–5.2)
Sodium: 139 mmol/L (ref 134–144)

## 2019-09-24 LAB — BRAIN NATRIURETIC PEPTIDE: BNP: 9.1 pg/mL (ref 0.0–100.0)

## 2019-09-24 NOTE — Progress Notes (Signed)
Hello Audrick,  Your lab result is normal and/or stable.Some minor variations that are not significant are commonly marked abnormal, but do not represent any medical problem for you.  Best regards, Bassy Fetterly, M.D.

## 2019-09-28 ENCOUNTER — Other Ambulatory Visit: Payer: Self-pay | Admitting: Family Medicine

## 2019-09-28 DIAGNOSIS — M25511 Pain in right shoulder: Secondary | ICD-10-CM

## 2019-10-19 ENCOUNTER — Other Ambulatory Visit: Payer: Self-pay | Admitting: Family Medicine

## 2019-10-30 ENCOUNTER — Other Ambulatory Visit: Payer: Self-pay | Admitting: Family Medicine

## 2019-11-01 ENCOUNTER — Other Ambulatory Visit: Payer: Self-pay

## 2019-11-04 ENCOUNTER — Encounter: Payer: Self-pay | Admitting: Family Medicine

## 2019-11-04 ENCOUNTER — Ambulatory Visit (INDEPENDENT_AMBULATORY_CARE_PROVIDER_SITE_OTHER): Payer: Medicare HMO | Admitting: Family Medicine

## 2019-11-04 VITALS — BP 111/69 | HR 106 | Temp 99.3°F | Resp 20 | Ht 69.0 in | Wt 303.0 lb

## 2019-11-04 DIAGNOSIS — J449 Chronic obstructive pulmonary disease, unspecified: Secondary | ICD-10-CM

## 2019-11-04 DIAGNOSIS — M25512 Pain in left shoulder: Secondary | ICD-10-CM

## 2019-11-04 DIAGNOSIS — I10 Essential (primary) hypertension: Secondary | ICD-10-CM | POA: Diagnosis not present

## 2019-11-04 DIAGNOSIS — M25511 Pain in right shoulder: Secondary | ICD-10-CM

## 2019-11-04 MED ORDER — MELOXICAM 15 MG PO TABS: 15.0000 mg | ORAL_TABLET | Freq: Every day | ORAL | 1 refills | Status: DC

## 2019-11-04 MED ORDER — AMLODIPINE BESYLATE 5 MG PO TABS: ORAL_TABLET | ORAL | 1 refills | Status: DC

## 2019-11-04 MED ORDER — POTASSIUM CHLORIDE CRYS ER 20 MEQ PO TBCR: EXTENDED_RELEASE_TABLET | ORAL | 1 refills | Status: DC

## 2019-11-04 MED ORDER — TORSEMIDE 100 MG PO TABS: 100.0000 mg | ORAL_TABLET | Freq: Two times a day (BID) | ORAL | 2 refills | Status: DC

## 2019-11-04 MED ORDER — ALLOPURINOL 100 MG PO TABS: ORAL_TABLET | ORAL | 0 refills | Status: DC

## 2019-11-04 NOTE — Progress Notes (Signed)
Subjective:  Patient ID: Juan Merritt, male    DOB: 1951-01-04  Age: 68 y.o. MRN: [id number]  CC: Medical Management of Chronic Issues   HPI Juan Merritt presents for  presents for  follow-up of hypertension. Patient has no history of headache chest pain or shortness of breath or recent cough. Patient also denies symptoms of TIA such as focal numbness or weakness. Patient denies side effects from medication. States taking it regularly.   Shoulder pain bilaterally. 6-7/10 . NKI. Present 2+ weeks, increasing in severity.Dull, deep ache.  Depression screen [facility] 2/9 09/23/2019 08/21/2019 08/14/2019  Decreased Interest 0 0 0  Down, Depressed, Hopeless 0 0 0  PHQ - 2 Score 0 0 0  Altered sleeping - - -  Tired, decreased energy - - -  Change in appetite - - -  Feeling bad or failure about yourself  - - -  Trouble concentrating - - -  Moving slowly or fidgety/restless - - -  Suicidal thoughts - - -  PHQ-9 Score - - -  Difficult doing work/chores - - -    History Shavon has a past medical history of Arthritis, COPD (chronic obstructive pulmonary disease) ([place]), Gallstones, Gout, Hypertension, Obesity, and Pneumonia.   He has a past surgical history that includes Cholecystectomy and Tonsillectomy.   His family history includes Asthma in his mother; Congestive Heart Failure in his mother; Diabetes in his father; Esophageal cancer in his paternal uncle; Heart attack in an other family member; Heart disease in his mother; Leukemia in his brother; Lung disease in an other family member.He reports that he quit smoking about 2 years ago. His smoking use included cigarettes. He has a 10.00 pack-year smoking history. His smokeless tobacco use includes chew. He reports previous alcohol use. He reports that he does not use drugs.    ROS Review of Systems  Constitutional: Negative.   HENT: Negative.   Eyes: Negative for visual disturbance.  Respiratory: Negative for cough and shortness of  breath.   Cardiovascular: Negative for chest pain and leg swelling.  Gastrointestinal: Negative for abdominal pain, diarrhea, nausea and vomiting.  Genitourinary: Negative for difficulty urinating.  Musculoskeletal: Positive for arthralgias. Negative for myalgias.  Skin: Negative for rash.  Neurological: Negative for headaches.  Psychiatric/Behavioral: Negative for sleep disturbance.    Objective:  BP 111/69   Pulse (!) 106   Temp 99.3 F (37.4 C) (Temporal)   Resp 20   Ht _0  (1.753 m)   Wt (!) 303 lb (137.4 kg)   SpO2 95%   BMI 44.75 kg/m   BP Readings from Last 3 Encounters:  11/04/19 111/69  09/23/19 113/74  08/21/19 116/77    Wt Readings from Last 3 Encounters:  11/04/19 (!) 303 lb (137.4 kg)  09/23/19 (!) 311 lb (141.1 kg)  08/21/19 (!) 309 lb (140.2 kg)     Physical Exam Vitals signs reviewed.  Constitutional:      Appearance: He is well-developed.  HENT:     Head: Normocephalic and atraumatic.     Right Ear: External ear normal.     Left Ear: External ear normal.     Mouth/Throat:     Pharynx: No oropharyngeal exudate or posterior oropharyngeal erythema.  Eyes:     Pupils: Pupils are equal, round, and reactive to light.  Neck:     Musculoskeletal: Normal range of motion and neck supple.  Cardiovascular:     Rate and Rhythm: Normal rate and regular rhythm.  Heart sounds: No murmur.  Pulmonary:     Effort: No respiratory distress.     Breath sounds: Normal breath sounds.  Neurological:     Mental Status: He is alert and oriented to person, place, and time.       Assessment & Plan:   Warden was seen today for medical management of chronic issues.  Diagnoses and all orders for this visit:  COPD mixed type ([place])  Acute pain of both shoulders -     meloxicam (MOBIC) 15 MG tablet; Take 1 tablet (15 mg total) by mouth daily.  Essential hypertension, benign -     BMP8+EGFR  Other orders -     potassium chloride SA (KLOR-CON M20) 20 MEQ  tablet; TAKE 1 TABLET (20 MEQ TOTAL) DAILY BY MOUTH. -     amLODipine (NORVASC) 5 MG tablet; TAKE 1 TABLET BY MOUTH EVERY DAY (Needs to be seen before next refill) -     allopurinol (ZYLOPRIM) 100 MG tablet; TAKE 1 TABLET (100 MG TOTAL) BY MOUTH DAILY. TO PREVENT GOUT -     torsemide (DEMADEX) 100 MG tablet; Take 1 tablet (100 mg total) by mouth 2 (two) times daily. On arising and at lunchtime       I have changed Kennyth Lose D. Cicio's meloxicam. I am also having him maintain his albuterol, budesonide-formoterol, losartan, potassium chloride SA, amLODipine, allopurinol, and torsemide.  Allergies as of 11/04/2019   No Known Allergies     Medication List       Accurate as of November 04, 2019 11:59 PM. If you have any questions, ask your nurse or doctor.        albuterol 108 (90 Base) MCG/ACT inhaler Commonly known as: VENTOLIN HFA Inhale 2 puffs into the lungs 2 (two) times daily.   allopurinol 100 MG tablet Commonly known as: ZYLOPRIM TAKE 1 TABLET (100 MG TOTAL) BY MOUTH DAILY. TO PREVENT GOUT   amLODipine 5 MG tablet Commonly known as: NORVASC TAKE 1 TABLET BY MOUTH EVERY DAY (Needs to be seen before next refill)   budesonide-formoterol 160-4.5 MCG/ACT inhaler Commonly known as: Symbicort TAKE 2 PUFFS BY MOUTH TWICE A DAY   losartan 100 MG tablet Commonly known as: COZAAR TAKE 1 TABLET BY MOUTH EVERY DAY   meloxicam 15 MG tablet Commonly known as: MOBIC Take 1 tablet (15 mg total) by mouth daily.   potassium chloride SA 20 MEQ tablet Commonly known as: Klor-Con M20 TAKE 1 TABLET (20 MEQ TOTAL) DAILY BY MOUTH.   torsemide 100 MG tablet Commonly known as: DEMADEX Take 1 tablet (100 mg total) by mouth 2 (two) times daily. On arising and at lunchtime        Follow-up: Return in about 3 months (around 02/04/2020) for hypertension, COPD, CHF.  [name], M.D.

## 2019-11-05 LAB — BMP8+EGFR
BUN/Creatinine Ratio: 16 (ref 10–24)
BUN: 21 mg/dL (ref 8–27)
CO2: 24 mmol/L (ref 20–29)
Calcium: 9.1 mg/dL (ref 8.6–10.2)
Chloride: 94 mmol/L — ABNORMAL LOW (ref 96–106)
Creatinine, Ser: 1.33 mg/dL — ABNORMAL HIGH (ref 0.76–1.27)
GFR calc Af Amer: 64 mL/min/{1.73_m2} (ref >59)
GFR calc non Af Amer: 55 mL/min/{1.73_m2} — ABNORMAL LOW (ref >59)
Glucose: 120 mg/dL — ABNORMAL HIGH (ref 65–99)
Potassium: 4.3 mmol/L (ref 3.5–5.2)
Sodium: 137 mmol/L (ref 134–144)

## 2019-11-05 NOTE — Progress Notes (Signed)
Hello Koltyn,  Your lab result is normal and/or stable.Some minor variations that are not significant are commonly marked abnormal, but do not represent any medical problem for you.  Best regards, Kyannah Climer, M.D.

## 2019-11-08 ENCOUNTER — Other Ambulatory Visit: Payer: Self-pay | Admitting: Family Medicine

## 2019-11-11 ENCOUNTER — Encounter: Payer: Self-pay | Admitting: Family Medicine

## 2019-11-14 ENCOUNTER — Other Ambulatory Visit: Payer: Self-pay | Admitting: Family Medicine

## 2019-11-15 ENCOUNTER — Other Ambulatory Visit: Payer: Self-pay | Admitting: Family Medicine

## 2019-11-20 ENCOUNTER — Other Ambulatory Visit: Payer: Self-pay

## 2019-11-20 DIAGNOSIS — Z20822 Contact with and (suspected) exposure to covid-19: Secondary | ICD-10-CM

## 2019-11-21 ENCOUNTER — Telehealth: Payer: Self-pay

## 2019-11-21 NOTE — Telephone Encounter (Signed)
Advise that result are not back yet

## 2019-11-22 ENCOUNTER — Ambulatory Visit (INDEPENDENT_AMBULATORY_CARE_PROVIDER_SITE_OTHER): Payer: Medicare HMO | Admitting: Family Medicine

## 2019-11-22 ENCOUNTER — Telehealth: Payer: Self-pay | Admitting: Family Medicine

## 2019-11-22 ENCOUNTER — Encounter: Payer: Self-pay | Admitting: Family Medicine

## 2019-11-22 ENCOUNTER — Telehealth: Payer: Self-pay

## 2019-11-22 DIAGNOSIS — J329 Chronic sinusitis, unspecified: Secondary | ICD-10-CM

## 2019-11-22 DIAGNOSIS — J40 Bronchitis, not specified as acute or chronic: Secondary | ICD-10-CM

## 2019-11-22 LAB — NOVEL CORONAVIRUS, NAA: SARS-CoV-2, NAA: DETECTED — AB

## 2019-11-22 MED ORDER — HYDROXYCHLOROQUINE SULFATE 200 MG PO TABS: 400.0000 mg | ORAL_TABLET | Freq: Every day | ORAL | 0 refills | Status: DC

## 2019-11-22 MED ORDER — PREDNISONE 10 MG PO TABS: ORAL_TABLET | ORAL | 0 refills | Status: DC

## 2019-11-22 MED ORDER — AZITHROMYCIN 250 MG PO TABS: ORAL_TABLET | ORAL | 0 refills | Status: DC

## 2019-11-22 NOTE — Telephone Encounter (Signed)
Patient wife advised that result are not back yet

## 2019-11-22 NOTE — Telephone Encounter (Signed)
I treated him for it, assuming he had it based on exposure plus symptoms.

## 2019-11-22 NOTE — Progress Notes (Signed)
Subjective:    Patient ID: Juan Merritt, male    DOB: 12/16/1951, 68 y.o.   MRN: [id number]   HPI: Juan Merritt is a 68 y.o. male presenting for concerns for CoVID. His wife's test was positive earlier this week. Pt. Myalgias present all over. No cough. Temp is 99.7. O2 is 94% on room air. Denies NVD.    Depression screen [facility] 2/9 09/23/2019 08/21/2019 08/14/2019 08/09/2019 11/30/2018  Decreased Interest 0 0 0 0 0  Down, Depressed, Hopeless 0 0 0 0 0  PHQ - 2 Score 0 0 0 0 0  Altered sleeping - - - 0 0  Tired, decreased energy - - - 0 0  Change in appetite - - - 0 0  Feeling bad or failure about yourself  - - - 0 0  Trouble concentrating - - - 0 0  Moving slowly or fidgety/restless - - - 0 0  Suicidal thoughts - - - 0 0  PHQ-9 Score - - - 0 0  Difficult doing work/chores - - - - Not difficult at all     Relevant past medical, surgical, family and social history reviewed and updated as indicated.  Interim medical history since our last visit reviewed. Allergies and medications reviewed and updated.  ROS:  Review of Systems  Constitutional: Positive for fever. Negative for activity change, appetite change and chills.  HENT: Positive for congestion. Negative for ear discharge, ear pain, hearing loss, nosebleeds, sneezing and trouble swallowing.   Respiratory: Positive for cough. Negative for chest tightness and shortness of breath.   Cardiovascular: Negative for chest pain and palpitations.  Musculoskeletal: Positive for myalgias.  Skin: Negative for rash.     Social History   Tobacco Use  Smoking Status Former Smoker  . Packs/day: 0.25  . Years: 40.00  . Pack years: 10.00  . Types: Cigarettes  . Quit date: 01/15/2017  . Years since quitting: 2.8  Smokeless Tobacco Current User  . Types: Chew  Tobacco Comment   tobacco info given 03/17/16       Objective:     Wt Readings from Last 3 Encounters:  11/04/19 (!) 303 lb (137.4 kg)  09/23/19 (!) 311 lb (141.1  kg)  08/21/19 (!) 309 lb (140.2 kg)     Exam deferred. Pt. Harboring due to COVID 19. Phone visit performed.   Assessment & Plan:   1. Sinobronchitis     Meds ordered this encounter  Medications  . predniSONE (DELTASONE) 10 MG tablet    Sig: Take 5 daily for 2 days followed by 4,3,2 and 1 for 2 days each.    Dispense:  30 tablet    Refill:  0  . hydroxychloroquine (PLAQUENIL) 200 MG tablet    Sig: Take 2 tablets (400 mg total) by mouth daily.    Dispense:  40 tablet    Refill:  0  . azithromycin (ZITHROMAX Z-PAK) 250 MG tablet    Sig: Take two right away Then one a day for the next 4 days.    Dispense:  6 each    Refill:  0    No orders of the defined types were placed in this encounter.     Diagnoses and all orders for this visit:  Sinobronchitis  Other orders -     predniSONE (DELTASONE) 10 MG tablet; Take 5 daily for 2 days followed by 4,3,2 and 1 for 2 days each. -     hydroxychloroquine (PLAQUENIL) 200 MG tablet;  Take 2 tablets (400 mg total) by mouth daily. -     azithromycin (ZITHROMAX Z-PAK) 250 MG tablet; Take two right away Then one a day for the next 4 days.    Virtual Visit via telephone Note  I discussed the limitations, risks, security and privacy concerns of performing an evaluation and management service by telephone and the availability of in person appointments. The patient was identified with two identifiers. Pt.expressed understanding and agreed to proceed. Pt. Is at home. Dr. Livia Snellen is in his office.  Follow Up Instructions:   I discussed the assessment and treatment plan with the patient. The patient was provided an opportunity to ask questions and all were answered. The patient agreed with the plan and demonstrated an understanding of the instructions.   The patient was advised to call back or seek an in-person evaluation if the symptoms worsen or if the condition fails to improve as anticipated.   Total minutes including chart review and  phone contact time: 10   Follow up plan: Return if symptoms worsen or fail to improve.  [name], MD [place]

## 2019-11-22 NOTE — Telephone Encounter (Signed)
[name] called wanting to let Dr Livia Snellen know that patients COVID results came back and he did test positive for COVID.

## 2019-11-23 ENCOUNTER — Telehealth: Payer: Self-pay | Admitting: Nurse Practitioner

## 2019-11-23 NOTE — Telephone Encounter (Signed)
Discussed with patient about Covid symptoms and the use of bamlanivimab, a monoclonal antibody infusion for those with mild to moderate Covid symptoms and at a high risk of hospitalization.  Pt is qualified for this infusion at the [facility] infusion center due to COPD, age > 77, and obesity.  However, at this time reports no symptoms and that he is getting treatment from PCP.  Stated appreciation for call.

## 2019-11-27 ENCOUNTER — Telehealth: Payer: Self-pay | Admitting: Family Medicine

## 2019-11-27 ENCOUNTER — Encounter (HOSPITAL_COMMUNITY): Payer: Self-pay | Admitting: *Deleted

## 2019-11-27 ENCOUNTER — Other Ambulatory Visit: Payer: Self-pay

## 2019-11-27 ENCOUNTER — Emergency Department (HOSPITAL_COMMUNITY): Payer: Medicare HMO

## 2019-11-27 ENCOUNTER — Emergency Department (HOSPITAL_COMMUNITY)
Admission: EM | Admit: 2019-11-27 | Discharge: 2019-11-27 | Disposition: A | Discharge status: Home / Self Care | Payer: Medicare HMO | Attending: Emergency Medicine | Admitting: Emergency Medicine

## 2019-11-27 DIAGNOSIS — R0602 Shortness of breath: Secondary | ICD-10-CM

## 2019-11-27 DIAGNOSIS — Z9981 Dependence on supplemental oxygen: Secondary | ICD-10-CM | POA: Insufficient documentation

## 2019-11-27 DIAGNOSIS — J1289 Other viral pneumonia: Secondary | ICD-10-CM | POA: Insufficient documentation

## 2019-11-27 DIAGNOSIS — I10 Essential (primary) hypertension: Secondary | ICD-10-CM | POA: Insufficient documentation

## 2019-11-27 DIAGNOSIS — Z79899 Other long term (current) drug therapy: Secondary | ICD-10-CM | POA: Insufficient documentation

## 2019-11-27 DIAGNOSIS — J449 Chronic obstructive pulmonary disease, unspecified: Secondary | ICD-10-CM | POA: Diagnosis not present

## 2019-11-27 DIAGNOSIS — Z87891 Personal history of nicotine dependence: Secondary | ICD-10-CM | POA: Insufficient documentation

## 2019-11-27 DIAGNOSIS — U071 COVID-19: Secondary | ICD-10-CM

## 2019-11-27 LAB — COMPREHENSIVE METABOLIC PANEL
ALT: 63 U/L — ABNORMAL HIGH (ref 0–44)
AST: 41 U/L (ref 15–41)
Albumin: 3.6 g/dL (ref 3.5–5.0)
Alkaline Phosphatase: 44 U/L (ref 38–126)
Anion gap: 11 (ref 5–15)
BUN: 25 mg/dL — ABNORMAL HIGH (ref 8–23)
CO2: 25 mmol/L (ref 22–32)
Calcium: 8.9 mg/dL (ref 8.9–10.3)
Chloride: 100 mmol/L (ref 98–111)
Creatinine, Ser: 1.13 mg/dL (ref 0.61–1.24)
GFR calc Af Amer: >60 mL/min (ref >60)
GFR calc non Af Amer: >60 mL/min (ref >60)
Glucose, Bld: 224 mg/dL — ABNORMAL HIGH (ref 70–99)
Potassium: 4 mmol/L (ref 3.5–5.1)
Sodium: 136 mmol/L (ref 135–145)
Total Bilirubin: 0.5 mg/dL (ref 0.3–1.2)
Total Protein: 7.2 g/dL (ref 6.5–8.1)

## 2019-11-27 LAB — URINALYSIS, ROUTINE W REFLEX MICROSCOPIC
Bilirubin Urine: NEGATIVE
Glucose, UA: NEGATIVE mg/dL
Hgb urine dipstick: NEGATIVE
Ketones, ur: NEGATIVE mg/dL
Leukocytes,Ua: NEGATIVE
Nitrite: NEGATIVE
Protein, ur: NEGATIVE mg/dL
Specific Gravity, Urine: 1.023 (ref 1.005–1.030)
pH: 6 (ref 5.0–8.0)

## 2019-11-27 LAB — CBC WITH DIFFERENTIAL/PLATELET
Abs Immature Granulocytes: 0.04 10*3/uL (ref 0.00–0.07)
Basophils Absolute: 0 10*3/uL (ref 0.0–0.1)
Basophils Relative: 0 %
Eosinophils Absolute: 0 10*3/uL (ref 0.0–0.5)
Eosinophils Relative: 0 %
HCT: 39.4 % (ref 39.0–52.0)
Hemoglobin: 12.7 g/dL — ABNORMAL LOW (ref 13.0–17.0)
Immature Granulocytes: 0 %
Lymphocytes Relative: 11 %
Lymphs Abs: 1 10*3/uL (ref 0.7–4.0)
MCH: 31.9 pg (ref 26.0–34.0)
MCHC: 32.2 g/dL (ref 30.0–36.0)
MCV: 99 fL (ref 80.0–100.0)
Monocytes Absolute: 0.5 10*3/uL (ref 0.1–1.0)
Monocytes Relative: 5 %
Neutro Abs: 7.6 10*3/uL (ref 1.7–7.7)
Neutrophils Relative %: 84 %
Platelets: 250 10*3/uL (ref 150–400)
RBC: 3.98 MIL/uL — ABNORMAL LOW (ref 4.22–5.81)
RDW: 13.2 % (ref 11.5–15.5)
WBC: 9.1 10*3/uL (ref 4.0–10.5)
nRBC: 0 % (ref 0.0–0.2)

## 2019-11-27 LAB — BRAIN NATRIURETIC PEPTIDE: B Natriuretic Peptide: 73 pg/mL (ref 0.0–100.0)

## 2019-11-27 LAB — C-REACTIVE PROTEIN: CRP: 6 mg/dL — ABNORMAL HIGH (ref <1.0)

## 2019-11-27 LAB — PROCALCITONIN: Procalcitonin: <0.1 ng/mL

## 2019-11-27 NOTE — Telephone Encounter (Signed)
Wife answered phone and states they are at [facility] ED now.

## 2019-11-27 NOTE — Telephone Encounter (Signed)
Spoke with pt's wife and she says pt is running low grade temp on and off around 99.8 and his O2 is between 91-94% on room air but he doesn't always feel SOB. She says he just feels terrible and isn't getting any better being on the medications and feels like he may need to go to the ED. Advised pt if he feels SOB and feels like he needs to go then he should but they wanted your opinion first.

## 2019-11-27 NOTE — ED Triage Notes (Addendum)
Pt from home with c/o SOB and generalized weakness that has continued for about a week and not getting any better. Pt was positive for Covid last week and says today is his last day on quarantine but he is no better. Pt reports he wakes up in the middle of the night and still feels exhausted. He has been on Prednisone, Hydroxychloroquine and Z-pak and reports he felt some better while taking those but once he stopped he started feeling worse again.

## 2019-11-27 NOTE — Telephone Encounter (Signed)
Please contact the patient : If he feels short of breath he should go to the E.D. Feeling terrible, in the sense of being achy isn't enough.NEither is low grade fever. He should  Also go if he is not holding down fluids and at risk for dehydration. WS

## 2019-11-27 NOTE — ED Notes (Signed)
Pt ambulated well. O2 sats stayed between 92 and 93% while ambulating.

## 2019-11-27 NOTE — Telephone Encounter (Signed)
Pt scheduled with Monia Pouch 12/11 at 8:50 for televisit for ED f/u.

## 2019-11-27 NOTE — ED Provider Notes (Signed)
[facility] EMERGENCY DEPARTMENT Provider Note   CSN: [id number] Arrival date & time: 11/27/19  [id number]     History   Chief Complaint Chief Complaint  Patient presents with  . Shortness of Breath    Covid +    HPI Juan Merritt is a 68 y.o. male.     Patient with COPD not on home oxygen, high blood pressure, obesity, recent diagnosis of Covid last week, currently on prednisone, hydroxychloroquine and azithromycin presents with worsening fatigue and persistent shortness of breath.  Symptoms approximately a week.  Patient denies chest pain.  Patient's had low-grade fevers.  Significant other had Covid.     Past Medical History:  Diagnosis Date  . Arthritis   . COPD (chronic obstructive pulmonary disease) (HCC)    not on home o2  . Gallstones   . Gout   . Hypertension   . Obesity   . Pneumonia     Patient Active Problem List   Diagnosis Date Noted  . Morbid obesity ([place]) 09/23/2019  . Gout, chronic 07/01/2016  . Incomplete rotator cuff tear 09/07/2015  . Essential hypertension, benign 09/07/2015  . COPD mixed type ([place]) 03/29/2015  . Cigarette smoker 03/24/2015    Past Surgical History:  Procedure Laterality Date  . CHOLECYSTECTOMY    . TONSILLECTOMY          Home Medications    Prior to Admission medications   Medication Sig Start Date End Date Taking? Authorizing Provider  albuterol (PROVENTIL HFA;VENTOLIN HFA) 108 (90 Base) MCG/ACT inhaler Inhale 2 puffs into the lungs 2 (two) times daily. 11/30/18  Yes Stacks, Cletus Gash, MD  allopurinol (ZYLOPRIM) 100 MG tablet TAKE 1 TABLET (100 MG TOTAL) BY MOUTH DAILY. TO PREVENT GOUT 11/04/19  Yes Stacks, Cletus Gash, MD  amLODipine (NORVASC) 5 MG tablet TAKE 1 TABLET BY MOUTH EVERY DAY (Needs to be seen before next refill) 11/04/19  Yes [name], MD  budesonide-formoterol (SYMBICORT) 160-4.5 MCG/ACT inhaler TAKE 2 PUFFS BY MOUTH TWICE A DAY 07/23/19  Yes [name], MD  hydroxychloroquine (PLAQUENIL) 200 MG  tablet Take 2 tablets (400 mg total) by mouth daily. 11/22/19  Yes Stacks, Cletus Gash, MD  losartan (COZAAR) 100 MG tablet TAKE 1 TABLET BY MOUTH EVERY DAY 10/30/19  Yes Stacks, Cletus Gash, MD  meloxicam (MOBIC) 15 MG tablet Take 1 tablet (15 mg total) by mouth daily. 11/04/19  Yes Stacks, Cletus Gash, MD  potassium chloride SA (KLOR-CON M20) 20 MEQ tablet TAKE 1 TABLET (20 MEQ TOTAL) DAILY BY MOUTH. 11/04/19  Yes [name], MD  torsemide (DEMADEX) 100 MG tablet Take 1 tablet (100 mg total) by mouth 2 (two) times daily. On arising and at lunchtime 11/04/19  Yes Stacks, Cletus Gash, MD  azithromycin (ZITHROMAX Z-PAK) 250 MG tablet Take two right away Then one a day for the next 4 days. Patient not taking: Reported on 11/27/2019 11/22/19   [name], MD  predniSONE (DELTASONE) 10 MG tablet Take 5 daily for 2 days followed by 4,3,2 and 1 for 2 days each. Patient not taking: Reported on 11/27/2019 11/22/19   [name], MD  bumetanide (BUMEX) 2 MG tablet Take 1 tablet (2 mg total) by mouth 2 (two) times daily. 08/09/19   [name], MD    Family History Family History  Problem Relation Age of Onset  . Asthma Mother   . Heart disease Mother   . Congestive Heart Failure Mother   . Diabetes Father   . Esophageal cancer Paternal Uncle   . Lung  disease Other        pat cousin  . Leukemia Brother   . Heart attack Other        pat cousin  . Colon cancer Neg Hx     Social History Social History   Tobacco Use  . Smoking status: Former Smoker    Packs/day: 0.25    Years: 40.00    Pack years: 10.00    Types: Cigarettes    Quit date: 01/15/2017    Years since quitting: 2.8  . Smokeless tobacco: Current User    Types: Chew  . Tobacco comment: tobacco info given 03/17/16  Substance Use Topics  . Alcohol use: Yes    Comment: 1-2 beers occasionally   . Drug use: No     Allergies   Patient has no known allergies.   Review of Systems Review of Systems  Constitutional: Positive for fatigue.  Negative for chills and fever.  HENT: Negative for congestion.   Eyes: Negative for visual disturbance.  Respiratory: Positive for cough and shortness of breath.   Cardiovascular: Negative for chest pain.  Gastrointestinal: Negative for abdominal pain and vomiting.  Genitourinary: Negative for dysuria and flank pain.  Musculoskeletal: Negative for back pain, neck pain and neck stiffness.  Skin: Negative for rash.  Neurological: Negative for light-headedness and headaches.     Physical Exam Updated Vital Signs BP 122/83   Pulse 95   Temp 100.3 F (37.9 C) (Oral)   Resp (!) 23   Ht 5\' 11"  (1.803 m)   Wt 133.8 kg   SpO2 94%   BMI 41.14 kg/m   Physical Exam Vitals signs and nursing note reviewed.  Constitutional:      Appearance: He is well-developed.  HENT:     Head: Normocephalic and atraumatic.  Eyes:     General:        Right eye: No discharge.        Left eye: No discharge.     Conjunctiva/sclera: Conjunctivae normal.  Neck:     Musculoskeletal: Normal range of motion and neck supple.     Trachea: No tracheal deviation.  Cardiovascular:     Rate and Rhythm: Regular rhythm. Tachycardia present.  Pulmonary:     Effort: Pulmonary effort is normal.     Breath sounds: Rhonchi present.  Abdominal:     General: There is no distension.     Palpations: Abdomen is soft.     Tenderness: There is no abdominal tenderness. There is no guarding.  Musculoskeletal:     Right lower leg: No edema.     Left lower leg: No edema.  Skin:    General: Skin is warm.     Findings: No rash.  Neurological:     Mental Status: He is alert and oriented to person, place, and time.      ED Treatments / Results  Labs (all labs ordered are listed, but only abnormal results are displayed) Labs Reviewed  COMPREHENSIVE METABOLIC PANEL - Abnormal; Notable for the following components:      Result Value   Glucose, Bld 224 (*)    BUN 25 (*)    ALT 63 (*)    All other components within  normal limits  CBC WITH DIFFERENTIAL/PLATELET - Abnormal; Notable for the following components:   RBC 3.98 (*)    Hemoglobin 12.7 (*)    All other components within normal limits  C-REACTIVE PROTEIN - Abnormal; Notable for the following components:   CRP 6.0 (*)  All other components within normal limits  BRAIN NATRIURETIC PEPTIDE  PROCALCITONIN  URINALYSIS, ROUTINE W REFLEX MICROSCOPIC    EKG EKG Interpretation  Date/Time:  Wednesday November 27 2019 10:16:35 EST Ventricular Rate:  99 PR Interval:    QRS Duration: 104 QT Interval:  345 QTC Calculation: 443 R Axis:   68 Text Interpretation: Sinus rhythm Atrial premature complex Borderline ST depression, diffuse leads Confirmed by Elnora Morrison [phone]) on 11/27/2019 12:49:24 PM   Radiology Dg Chest Portable 1 View  Result Date: 11/27/2019 CLINICAL DATA:  Shortness of breath, positive COVID EXAM: PORTABLE CHEST 1 VIEW COMPARISON:  September 23, 2019 FINDINGS: There is mild cardiomegaly. There is mildly increased hazy airspace opacity seen at both lung bases. There is prominence to the pulmonary vasculature. Aortic knob calcifications. No acute osseous abnormality. IMPRESSION: Nonspecific mildly increased hazy airspace opacity at both lung bases, which could be due to atelectasis and/or early atypical viral infectious etiology. Mild cardiomegaly and pulmonary vascular congestion. Electronically Signed   By: Prudencio Pair M.D.   On: 11/27/2019 10:45    Procedures Procedures (including critical care time)  Medications Ordered in ED Medications - No data to display   Initial Impression / Assessment and Plan / ED Course  I have reviewed the triage vital signs and the nursing notes.  Pertinent labs & imaging results that were available during my care of the patient were reviewed by me and considered in my medical decision making (see chart for details).       Patient presents with worsening Covid symptoms specifically shortness  of breath and general fatigue/weakness.  Patient has borderline heart rate and temperature in the ER.  Patient has mild tachypnea.  Patient is in no distress and not requiring oxygen at this time lowest oxygen 90%.  Plan to ambulate without oxygen to test patient.  Plan for general blood work, chest x-ray and reassessment.  Urinalysis pending.  Blood work reviewed hemoglobin 12.7 no active bleeding, CRP 6 consistent with Covid.  Patient ambulated without significant difficulty, oxygen maintained mid 90s.  Patient requesting to go home.  Patient stable for outpatient follow-up.  Chest x-ray viral type process.  Normal white blood cell count.  Results and differential diagnosis were discussed with the patient/parent/guardian. Xrays were independently reviewed by myself.  Close follow up outpatient was discussed, comfortable with the plan.   Medications - No data to display  Vitals:   11/27/19 1408 11/27/19 1409 11/27/19 1410 11/27/19 1411  BP:      Pulse: 92 93 90 95  Resp: (!) 22 (!) 24 (!) 24 (!) 23  Temp:      TempSrc:      SpO2: 93% 94% 94% 94%  Weight:      Height:        Final diagnoses:  Pneumonia due to COVID-19 virus  Shortness of breath     Final Clinical Impressions(s) / ED Diagnoses   Final diagnoses:  Pneumonia due to COVID-19 virus  Shortness of breath    ED Discharge Orders    None       Elnora Morrison, MD 11/27/19 1415

## 2019-11-27 NOTE — Discharge Instructions (Signed)
Follow-up with your primary doctor.  See a clinician or come to the ER if you develop worsening shortness of breath or new symptoms.  Finished taking the medicines as prescribed by your doctor.

## 2019-11-29 ENCOUNTER — Ambulatory Visit (INDEPENDENT_AMBULATORY_CARE_PROVIDER_SITE_OTHER): Payer: Medicare HMO | Admitting: Family Medicine

## 2019-11-29 ENCOUNTER — Encounter: Payer: Self-pay | Admitting: Family Medicine

## 2019-11-29 DIAGNOSIS — R0602 Shortness of breath: Secondary | ICD-10-CM

## 2019-11-29 DIAGNOSIS — U071 COVID-19: Secondary | ICD-10-CM | POA: Diagnosis not present

## 2019-11-29 NOTE — Progress Notes (Signed)
Virtual Visit via telephone Note Due to COVID-19 pandemic this visit was conducted virtually. This visit type was conducted due to national recommendations for restrictions regarding the COVID-19 Pandemic (e.g. social distancing, sheltering in place) in an effort to limit this patient's exposure and mitigate transmission in our community. All issues noted in this document were discussed and addressed.  A physical exam was not performed with this format.   I connected with Juan Merritt on 11/29/2019 at [place] by telephone and verified that I am speaking with the correct person using two identifiers. Juan Merritt is currently located at home and family is currently with them during visit. The provider, Monia Pouch, FNP is located in their office at time of visit.  I discussed the limitations, risks, security and privacy concerns of performing an evaluation and management service by telephone and the availability of in person appointments. I also discussed with the patient that there may be a patient responsible charge related to this service. The patient expressed understanding and agreed to proceed.  Subjective:  Patient ID: Juan Merritt, male    DOB: June 23, 1951, 68 y.o.   MRN: [id number]  Chief Complaint:  Shortness of Breath and Cough   HPI: JUBEI HERWICK is a 68 y.o. male presenting on 11/29/2019 for Shortness of Breath and Cough   Ongoing shortness of breath and cough that is worse around 0300. Pt states he does ok until 0300 and then wakes with cough and shortness of breath. He was diagnosed with COVID-19 and has been taking his medications as prescribed. He was seen in the ED at [facility] on 11/09/02020 and diagnosed with pneumonia from COVID-19 virus. Pt states he continues to have symptoms despite nebulizer treatments every 6 hours and other medications as prescribed. Oxygen saturation is staying above 90% on room air per family report.   Shortness of Breath This is a  recurrent problem. The current episode started 1 to 4 weeks ago. The problem occurs constantly. The problem has been waxing and waning. Associated symptoms include a fever, headaches, PND, rhinorrhea and sputum production. Pertinent negatives include no abdominal pain, chest pain, claudication, coryza, ear pain, hemoptysis, leg pain, leg swelling, neck pain, orthopnea, rash, sore throat, swollen glands, syncope, vomiting or wheezing. The symptoms are aggravated by any activity and lying flat.  Cough This is a recurrent problem. The current episode started 1 to 4 weeks ago. The problem has been waxing and waning. The problem occurs every few minutes. Associated symptoms include chills, a fever, headaches, rhinorrhea and shortness of breath. Pertinent negatives include no chest pain, ear congestion, ear pain, heartburn, hemoptysis, myalgias, nasal congestion, postnasal drip, rash, sore throat, sweats, weight loss or wheezing.     Relevant past medical, surgical, family, and social history reviewed and updated as indicated.  Allergies and medications reviewed and updated.   Past Medical History:  Diagnosis Date  . Arthritis   . COPD (chronic obstructive pulmonary disease) (HCC)    not on home o2  . Gallstones   . Gout   . Hypertension   . Obesity   . Pneumonia     Past Surgical History:  Procedure Laterality Date  . CHOLECYSTECTOMY    . TONSILLECTOMY      Social History   Socioeconomic History  . Marital status: Married    Spouse name: [name]  . Number of children: 5  . Years of education: 10  . Highest education level: 10th grade  Occupational History  .  Occupation: retired Nurse, mental health  . Smoking status: Former Smoker    Packs/day: 0.25    Years: 40.00    Pack years: 10.00    Types: Cigarettes    Quit date: 01/15/2017    Years since quitting: 2.8  . Smokeless tobacco: Current User    Types: Chew  . Tobacco comment: tobacco info given 03/17/16  Substance and  Sexual Activity  . Alcohol use: Yes    Comment: 1-2 beers occasionally   . Drug use: No  . Sexual activity: Not Currently  Other Topics Concern  . Not on file  Social History Narrative  . Not on file   Social Determinants of Health   Financial Resource Strain: Low Risk   . Difficulty of Paying Living Expenses: Not hard at all  Food Insecurity: No Food Insecurity  . Worried About Charity fundraiser in the Last Year: Never true  . Ran Out of Food in the Last Year: Never true  Transportation Needs: No Transportation Needs  . Lack of Transportation (Medical): No  . Lack of Transportation (Non-Medical): No  Physical Activity: Inactive  . Days of Exercise per Week: 0 days  . Minutes of Exercise per Session: 0 min  Stress: No Stress Concern Present  . Feeling of Stress : Not at all  Social Connections: Somewhat Isolated  . Frequency of Communication with Friends and Family: More than three times a week  . Frequency of Social Gatherings with Friends and Family: Three times a week  . Attends Religious Services: Never  . Active Member of Clubs or Organizations: No  . Attends Archivist Meetings: Never  . Marital Status: Married  Human resources officer Violence: Not At Risk  . Fear of Current or Ex-Partner: No  . Emotionally Abused: No  . Physically Abused: No  . Sexually Abused: No    Outpatient Encounter Medications as of 11/29/2019  Medication Sig  . albuterol (PROVENTIL HFA;VENTOLIN HFA) 108 (90 Base) MCG/ACT inhaler Inhale 2 puffs into the lungs 2 (two) times daily.  Marland Kitchen allopurinol (ZYLOPRIM) 100 MG tablet TAKE 1 TABLET (100 MG TOTAL) BY MOUTH DAILY. TO PREVENT GOUT  . amLODipine (NORVASC) 5 MG tablet TAKE 1 TABLET BY MOUTH EVERY DAY (Needs to be seen before next refill)  . budesonide-formoterol (SYMBICORT) 160-4.5 MCG/ACT inhaler TAKE 2 PUFFS BY MOUTH TWICE A DAY  . hydroxychloroquine (PLAQUENIL) 200 MG tablet Take 2 tablets (400 mg total) by mouth daily.  Marland Kitchen losartan  (COZAAR) 100 MG tablet TAKE 1 TABLET BY MOUTH EVERY DAY  . meloxicam (MOBIC) 15 MG tablet Take 1 tablet (15 mg total) by mouth daily.  . potassium chloride SA (KLOR-CON M20) 20 MEQ tablet TAKE 1 TABLET (20 MEQ TOTAL) DAILY BY MOUTH.  . torsemide (DEMADEX) 100 MG tablet Take 1 tablet (100 mg total) by mouth 2 (two) times daily. On arising and at lunchtime  . [DISCONTINUED] azithromycin (ZITHROMAX Z-PAK) 250 MG tablet Take two right away Then one a day for the next 4 days. (Patient not taking: Reported on 11/27/2019)  . [DISCONTINUED] bumetanide (BUMEX) 2 MG tablet Take 1 tablet (2 mg total) by mouth 2 (two) times daily.  . [DISCONTINUED] predniSONE (DELTASONE) 10 MG tablet Take 5 daily for 2 days followed by 4,3,2 and 1 for 2 days each. (Patient not taking: Reported on 11/27/2019)   No facility-administered encounter medications on file as of 11/29/2019.    No Known Allergies  Review of Systems  Constitutional: Positive for  chills, fatigue and fever. Negative for activity change, appetite change, diaphoresis, unexpected weight change and weight loss.  HENT: Positive for congestion and rhinorrhea. Negative for ear pain, postnasal drip and sore throat.   Eyes: Negative.   Respiratory: Positive for cough, sputum production and shortness of breath. Negative for hemoptysis, chest tightness and wheezing.   Cardiovascular: Positive for PND. Negative for chest pain, palpitations, orthopnea, claudication, leg swelling and syncope.  Gastrointestinal: Negative for abdominal pain, blood in stool, constipation, diarrhea, heartburn, nausea and vomiting.  Endocrine: Negative.   Genitourinary: Negative for decreased urine volume, difficulty urinating, dysuria, frequency and urgency.  Musculoskeletal: Negative for arthralgias, myalgias and neck pain.  Skin: Negative.  Negative for rash.  Allergic/Immunologic: Negative.   Neurological: Positive for weakness (generalized) and headaches. Negative for dizziness,  tremors, seizures, syncope, facial asymmetry, speech difficulty, light-headedness and numbness.  Hematological: Negative.   Psychiatric/Behavioral: Negative for confusion, hallucinations, sleep disturbance and suicidal ideas.  All other systems reviewed and are negative.        Observations/Objective: No vital signs or physical exam, this was a telephone or virtual health encounter.  Pt alert and oriented, answers all questions appropriately, and able to speak in full sentences.    Assessment and Plan: Vearl was seen today for shortness of breath and cough.  Diagnoses and all orders for this visit:  Shortness of breath COVID-19 virus infection Symptomatic care discussed in detail. Pt aware to continue prescribed medications. Pt aware if symptoms persist or worsen, he needs to be reevaluated. Pt aware of symptoms that require emergent evaluation and treatment.     Follow Up Instructions: Return if symptoms worsen or fail to improve.    I discussed the assessment and treatment plan with the patient. The patient was provided an opportunity to ask questions and all were answered. The patient agreed with the plan and demonstrated an understanding of the instructions.   The patient was advised to call back or seek an in-person evaluation if the symptoms worsen or if the condition fails to improve as anticipated.  The above assessment and management plan was discussed with the patient. The patient verbalized understanding of and has agreed to the management plan. Patient is aware to call the clinic if they develop any new symptoms or if symptoms persist or worsen. Patient is aware when to return to the clinic for a follow-up visit. Patient educated on when it is appropriate to go to the emergency department.    I provided 15 minutes of non-face-to-face time during this encounter. The call started at [place]. The call ended at 0900. The other time was used for coordination of care.     Monia Pouch, FNP-C [facility] [address] [place], [place] [address] [phone] 11/29/2019

## 2019-12-23 ENCOUNTER — Other Ambulatory Visit: Payer: Self-pay | Admitting: Family Medicine

## 2019-12-24 ENCOUNTER — Ambulatory Visit (INDEPENDENT_AMBULATORY_CARE_PROVIDER_SITE_OTHER): Payer: Medicare HMO | Admitting: Family

## 2019-12-24 ENCOUNTER — Encounter: Payer: Self-pay | Admitting: Family

## 2019-12-24 DIAGNOSIS — M79671 Pain in right foot: Secondary | ICD-10-CM

## 2019-12-24 DIAGNOSIS — M79672 Pain in left foot: Secondary | ICD-10-CM

## 2019-12-24 MED ORDER — PREDNISONE 10 MG (21) PO TBPK: ORAL_TABLET | ORAL | 0 refills | Status: DC

## 2019-12-24 NOTE — Progress Notes (Signed)
   Virtual Visit via telephone Note Due to COVID-19 pandemic this visit was conducted virtually. This visit type was conducted due to national recommendations for restrictions regarding the COVID-19 Pandemic (e.g. social distancing, sheltering in place) in an effort to limit this patient's exposure and mitigate transmission in our community. All issues noted in this document were discussed and addressed.  A physical exam was not performed with this format.  I connected with Juan Merritt on 12/24/19 at 11:42 AM by telephone and verified that I am speaking with the correct person using two identifiers. Juan Merritt is currently located at home and his wife is currently with him during visit. The provider, Evelina Dun, FNP is located in their office at time of visit.  I discussed the limitations, risks, security and privacy concerns of performing an evaluation and management service by telephone and the availability of in person appointments. I also discussed with the patient that there may be a patient responsible charge related to this service. The patient expressed understanding and agreed to proceed.   History and Present Illness:  HPI  Pt calls the office today with bilateral heel pain. He reports this pain started 3-4 days ago and is worse in his left heel. He states the pain in the morning is an aching pain of 9.5 out 10, but improves as the day goes on. He has taken tylenol with mild pain.   Denies any fever or heat.  States bilateral heels are light pink.   He takes Mobic 15 mg daily.   He was diagnosed with COVID over a month ago and reports he was laying  Around and not active as usual over the last few weeks.   Review of Systems  All other systems reviewed and are negative.    Observations/Objective: No SOB or distress noted  Assessment and Plan: 1. Heel pain, bilateral Continue mobic Heel spurs? I do not believe this is gout as area is not erythemas or hot.  I  have placed a referral to podiatry per patients request Call if symptoms worsen or do not improve  - Ambulatory referral to Podiatry - predniSONE (STERAPRED UNI-PAK 21 TAB) 10 MG (21) TBPK tablet; Use as directed  Dispense: 21 tablet; Refill: 0    I discussed the assessment and treatment plan with the patient. The patient was provided an opportunity to ask questions and all were answered. The patient agreed with the plan and demonstrated an understanding of the instructions.   The patient was advised to call back or seek an in-person evaluation if the symptoms worsen or if the condition fails to improve as anticipated.  The above assessment and management plan was discussed with the patient. The patient verbalized understanding of and has agreed to the management plan. Patient is aware to call the clinic if symptoms persist or worsen. Patient is aware when to return to the clinic for a follow-up visit. Patient educated on when it is appropriate to go to the emergency department.   Time call ended:  11:59   I provided 17 minutes of non-face-to-face time during this encounter.    Evelina Dun, FNP

## 2020-01-07 DIAGNOSIS — M19071 Primary osteoarthritis, right ankle and foot: Secondary | ICD-10-CM | POA: Diagnosis not present

## 2020-01-07 DIAGNOSIS — M25571 Pain in right ankle and joints of right foot: Secondary | ICD-10-CM | POA: Diagnosis not present

## 2020-02-03 ENCOUNTER — Other Ambulatory Visit: Payer: Self-pay

## 2020-02-04 ENCOUNTER — Encounter: Payer: Self-pay | Admitting: Family Medicine

## 2020-02-04 ENCOUNTER — Ambulatory Visit (INDEPENDENT_AMBULATORY_CARE_PROVIDER_SITE_OTHER): Payer: Medicare HMO | Admitting: Family Medicine

## 2020-02-04 VITALS — BP 102/66 | HR 82 | Temp 99.1°F | Ht 71.0 in | Wt 304.2 lb

## 2020-02-04 DIAGNOSIS — E119 Type 2 diabetes mellitus without complications: Secondary | ICD-10-CM

## 2020-02-04 DIAGNOSIS — I10 Essential (primary) hypertension: Secondary | ICD-10-CM | POA: Diagnosis not present

## 2020-02-04 DIAGNOSIS — J449 Chronic obstructive pulmonary disease, unspecified: Secondary | ICD-10-CM | POA: Diagnosis not present

## 2020-02-04 LAB — BAYER DCA HB A1C WAIVED: HB A1C (BAYER DCA - WAIVED): 7.1 % — ABNORMAL HIGH (ref <7.0)

## 2020-02-04 NOTE — Progress Notes (Signed)
Subjective:  Patient ID: Juan Merritt, male    DOB: April 15, 1951  Age: 69 y.o. MRN: [id number]  CC: Follow-up (3 month)   HPI Juan Merritt presents forFollow-up of diabetes. Patient checks blood sugar at home.   100 fasting and 180 postprandial Patient denies symptoms such as polyuria, polydipsia, excessive hunger, nausea No significant hypoglycemic spells noted. Medications reviewed. Pt reports taking them regularly without complication/adverse reaction being reported today.    History Juan Merritt has a past medical history of Arthritis, COPD (chronic obstructive pulmonary disease) ([place]), Gallstones, Gout, Hypertension, Obesity, and Pneumonia.   Juan Merritt has a past surgical history that includes Cholecystectomy and Tonsillectomy.   His family history includes Asthma in his mother; Congestive Heart Failure in his mother; Diabetes in his father; Esophageal cancer in his paternal uncle; Heart attack in an other family member; Heart disease in his mother; Leukemia in his brother; Lung disease in an other family member.Juan Merritt reports that Juan Merritt quit smoking about 3 years ago. His smoking use included cigarettes. Juan Merritt has a 10.00 pack-year smoking history. His smokeless tobacco use includes chew. Juan Merritt reports current alcohol use. Juan Merritt reports that Juan Merritt does not use drugs.  Current Outpatient Medications on File Prior to Visit  Medication Sig Dispense Refill  . albuterol (PROVENTIL HFA;VENTOLIN HFA) 108 (90 Base) MCG/ACT inhaler Inhale 2 puffs into the lungs 2 (two) times daily. 3 Inhaler 1  . allopurinol (ZYLOPRIM) 100 MG tablet TAKE 1 TABLET (100 MG TOTAL) BY MOUTH DAILY. TO PREVENT GOUT 90 tablet 0  . amLODipine (NORVASC) 5 MG tablet TAKE 1 TABLET BY MOUTH EVERY DAY (Needs to be seen before next refill) 90 tablet 1  . budesonide-formoterol (SYMBICORT) 160-4.5 MCG/ACT inhaler TAKE 2 PUFFS BY MOUTH TWICE A DAY 30.6 Inhaler 5  . losartan (COZAAR) 100 MG tablet TAKE 1 TABLET BY MOUTH EVERY DAY 90 tablet 0  .  meloxicam (MOBIC) 15 MG tablet Take 1 tablet (15 mg total) by mouth daily. 90 tablet 1  . potassium chloride SA (KLOR-CON M20) 20 MEQ tablet TAKE 1 TABLET (20 MEQ TOTAL) DAILY BY MOUTH. 90 tablet 1  . torsemide (DEMADEX) 100 MG tablet Take 1 tablet (100 mg total) by mouth 2 (two) times daily. On arising and at lunchtime 60 tablet 2  . [DISCONTINUED] bumetanide (BUMEX) 2 MG tablet Take 1 tablet (2 mg total) by mouth 2 (two) times daily. 60 tablet 0   No current facility-administered medications on file prior to visit.    ROS Review of Systems  Constitutional: Negative for fever.  Respiratory: Negative for shortness of breath.   Cardiovascular: Negative for chest pain.  Musculoskeletal: Negative for arthralgias.  Skin: Negative for rash.    Objective:  BP 102/66   Pulse 82   Temp 99.1 F (37.3 C) (Temporal)   Ht '5\' 11"'  (1.803 m)   Wt (!) 304 lb 3.2 oz (138 kg)   BMI 42.43 kg/m   BP Readings from Last 3 Encounters:  02/04/20 102/66  11/27/19 122/83  11/04/19 111/69    Wt Readings from Last 3 Encounters:  02/04/20 (!) 304 lb 3.2 oz (138 kg)  11/27/19 295 lb (133.8 kg)  11/04/19 (!) 303 lb (137.4 kg)     Physical Exam Constitutional:      General: Juan Merritt is not in acute distress.    Appearance: Juan Merritt is well-developed.  HENT:     Head: Normocephalic and atraumatic.     Right Ear: External ear normal.     Left Ear:  External ear normal.     Nose: Nose normal.  Eyes:     Conjunctiva/sclera: Conjunctivae normal.     Pupils: Pupils are equal, round, and reactive to light.  Cardiovascular:     Rate and Rhythm: Normal rate and regular rhythm.     Heart sounds: Normal heart sounds. No murmur.  Pulmonary:     Effort: Pulmonary effort is normal. No respiratory distress.     Breath sounds: Normal breath sounds. No wheezing or rales.  Abdominal:     Palpations: Abdomen is soft.     Tenderness: There is no abdominal tenderness.  Musculoskeletal:        General: Normal range of  motion.     Cervical back: Normal range of motion and neck supple.  Skin:    General: Skin is warm and dry.  Neurological:     Mental Status: Juan Merritt is alert and oriented to person, place, and time.     Deep Tendon Reflexes: Reflexes are normal and symmetric.  Psychiatric:        Behavior: Behavior normal.        Thought Content: Thought content normal.        Judgment: Judgment normal.      Results for orders placed or performed in visit on 02/04/20  Bayer DCA Hb A1c Waived  Result Value Ref Range   HB A1C (BAYER DCA - WAIVED) 7.1 (H) <7.0 %    Assessment & Plan:   Juan Merritt was seen today for follow-up.  Diagnoses and all orders for this visit:  Essential hypertension, benign -     Bayer DCA Hb A1c Waived -     CBC with Differential/Platelet -     CMP14+EGFR  Type 2 diabetes mellitus without complication, without long-term current use of insulin (HCC) -     Bayer DCA Hb A1c Waived -     CBC with Differential/Platelet -     CMP14+EGFR  Morbid obesity (HCC) -     Bayer DCA Hb A1c Waived -     CBC with Differential/Platelet -     CMP14+EGFR  COPD mixed type ([place])   Patient stable with regard to his current diagnoses listed above.  Of note is that Juan Merritt has multiple risk factors for coronary disease including age, gender,Hypertension, diabetes, smoker.  Like to see cardiology for evaluation and possible stress echocardiogram.  We discussed in detail the need to take several pounds.  Juan Merritt understands the need Juan Merritt just is lacking motivation.  Encouragement given discussed specifics of plans.  His COPD is currently stable.  Minimal dyspnea while using his Symbicort.  Rare use of the rescue inhaler only continue medications as above.   I have discontinued Juan Merritt. Bitter's hydroxychloroquine and predniSONE. I am also having him maintain his albuterol, budesonide-formoterol, losartan, meloxicam, potassium chloride SA, amLODipine, allopurinol, and torsemide.  No orders of the defined  types were placed in this encounter.    Follow-up: Return in about 3 months (around 05/03/2020).  [name], M.D.

## 2020-02-05 LAB — CMP14+EGFR
ALT: 56 IU/L — ABNORMAL HIGH (ref 0–44)
AST: 26 IU/L (ref 0–40)
Albumin/Globulin Ratio: 1.4 (ref 1.2–2.2)
Albumin: 4 g/dL (ref 3.8–4.8)
Alkaline Phosphatase: 57 IU/L (ref 39–117)
BUN/Creatinine Ratio: 19 (ref 10–24)
BUN: 19 mg/dL (ref 8–27)
Bilirubin Total: 0.3 mg/dL (ref 0.0–1.2)
CO2: 26 mmol/L (ref 20–29)
Calcium: 9.1 mg/dL (ref 8.6–10.2)
Chloride: 100 mmol/L (ref 96–106)
Creatinine, Ser: 1.01 mg/dL (ref 0.76–1.27)
GFR calc Af Amer: 89 mL/min/{1.73_m2} (ref >59)
GFR calc non Af Amer: 77 mL/min/{1.73_m2} (ref >59)
Globulin, Total: 2.8 g/dL (ref 1.5–4.5)
Glucose: 112 mg/dL — ABNORMAL HIGH (ref 65–99)
Potassium: 3.7 mmol/L (ref 3.5–5.2)
Sodium: 142 mmol/L (ref 134–144)
Total Protein: 6.8 g/dL (ref 6.0–8.5)

## 2020-02-05 LAB — CBC WITH DIFFERENTIAL/PLATELET
Basophils Absolute: 0.1 10*3/uL (ref 0.0–0.2)
Basos: 1 %
EOS (ABSOLUTE): 0.2 10*3/uL (ref 0.0–0.4)
Eos: 2 %
Hematocrit: 38.3 % (ref 37.5–51.0)
Hemoglobin: 13.2 g/dL (ref 13.0–17.7)
Immature Grans (Abs): 0 10*3/uL (ref 0.0–0.1)
Immature Granulocytes: 0 %
Lymphocytes Absolute: 3.6 10*3/uL — ABNORMAL HIGH (ref 0.7–3.1)
Lymphs: 45 %
MCH: 32.7 pg (ref 26.6–33.0)
MCHC: 34.5 g/dL (ref 31.5–35.7)
MCV: 95 fL (ref 79–97)
Monocytes Absolute: 0.7 10*3/uL (ref 0.1–0.9)
Monocytes: 9 %
Neutrophils Absolute: 3.4 10*3/uL (ref 1.4–7.0)
Neutrophils: 43 %
Platelets: 273 10*3/uL (ref 150–450)
RBC: 4.04 x10E6/uL — ABNORMAL LOW (ref 4.14–5.80)
RDW: 13 % (ref 11.6–15.4)
WBC: 8 10*3/uL (ref 3.4–10.8)

## 2020-02-05 NOTE — Progress Notes (Signed)
Hello Irma,  Your lab result is normal and/or stable.Some minor variations that are not significant are commonly marked abnormal, but do not represent any medical problem for you.  Best regards, Soila Printup, M.D.

## 2020-03-10 ENCOUNTER — Other Ambulatory Visit: Payer: Self-pay | Admitting: Family Medicine

## 2020-03-18 ENCOUNTER — Other Ambulatory Visit: Payer: Self-pay | Admitting: Family Medicine

## 2020-03-25 DIAGNOSIS — Z720 Tobacco use: Secondary | ICD-10-CM | POA: Diagnosis not present

## 2020-03-25 DIAGNOSIS — T50B95A Adverse effect of other viral vaccines, initial encounter: Secondary | ICD-10-CM | POA: Diagnosis not present

## 2020-03-25 DIAGNOSIS — R000 Tachycardia, unspecified: Secondary | ICD-10-CM | POA: Diagnosis not present

## 2020-03-25 DIAGNOSIS — R0602 Shortness of breath: Secondary | ICD-10-CM | POA: Diagnosis not present

## 2020-03-25 DIAGNOSIS — R509 Fever, unspecified: Secondary | ICD-10-CM | POA: Diagnosis not present

## 2020-03-25 DIAGNOSIS — Z8616 Personal history of COVID-19: Secondary | ICD-10-CM | POA: Diagnosis not present

## 2020-03-25 DIAGNOSIS — J449 Chronic obstructive pulmonary disease, unspecified: Secondary | ICD-10-CM | POA: Diagnosis not present

## 2020-03-25 DIAGNOSIS — R5083 Postvaccination fever: Secondary | ICD-10-CM | POA: Diagnosis not present

## 2020-03-26 DIAGNOSIS — R000 Tachycardia, unspecified: Secondary | ICD-10-CM | POA: Diagnosis not present

## 2020-03-26 DIAGNOSIS — Z720 Tobacco use: Secondary | ICD-10-CM | POA: Diagnosis not present

## 2020-03-26 DIAGNOSIS — R0602 Shortness of breath: Secondary | ICD-10-CM | POA: Diagnosis not present

## 2020-03-26 DIAGNOSIS — T50B95A Adverse effect of other viral vaccines, initial encounter: Secondary | ICD-10-CM | POA: Diagnosis not present

## 2020-03-26 DIAGNOSIS — R509 Fever, unspecified: Secondary | ICD-10-CM | POA: Diagnosis not present

## 2020-03-26 DIAGNOSIS — Z8616 Personal history of COVID-19: Secondary | ICD-10-CM | POA: Diagnosis not present

## 2020-03-26 DIAGNOSIS — J449 Chronic obstructive pulmonary disease, unspecified: Secondary | ICD-10-CM | POA: Diagnosis not present

## 2020-03-26 DIAGNOSIS — R5083 Postvaccination fever: Secondary | ICD-10-CM | POA: Diagnosis not present

## 2020-03-27 ENCOUNTER — Other Ambulatory Visit: Payer: Self-pay | Admitting: Family Medicine

## 2020-04-28 ENCOUNTER — Other Ambulatory Visit: Payer: Self-pay | Admitting: Family Medicine

## 2020-04-29 ENCOUNTER — Ambulatory Visit: Payer: Medicare HMO | Admitting: Physician Assistant

## 2020-04-30 DIAGNOSIS — L03113 Cellulitis of right upper limb: Secondary | ICD-10-CM | POA: Diagnosis not present

## 2020-05-05 ENCOUNTER — Ambulatory Visit (INDEPENDENT_AMBULATORY_CARE_PROVIDER_SITE_OTHER): Payer: Medicare HMO | Admitting: Family Medicine

## 2020-05-05 ENCOUNTER — Encounter: Payer: Self-pay | Admitting: Family Medicine

## 2020-05-05 ENCOUNTER — Other Ambulatory Visit: Payer: Self-pay

## 2020-05-05 VITALS — BP 116/73 | HR 73 | Temp 97.9°F | Ht 71.0 in | Wt 295.0 lb

## 2020-05-05 DIAGNOSIS — R351 Nocturia: Secondary | ICD-10-CM | POA: Diagnosis not present

## 2020-05-05 DIAGNOSIS — N401 Enlarged prostate with lower urinary tract symptoms: Secondary | ICD-10-CM | POA: Diagnosis not present

## 2020-05-05 DIAGNOSIS — E1159 Type 2 diabetes mellitus with other circulatory complications: Secondary | ICD-10-CM | POA: Diagnosis not present

## 2020-05-05 DIAGNOSIS — E782 Mixed hyperlipidemia: Secondary | ICD-10-CM | POA: Diagnosis not present

## 2020-05-05 DIAGNOSIS — E790 Hyperuricemia without signs of inflammatory arthritis and tophaceous disease: Secondary | ICD-10-CM | POA: Diagnosis not present

## 2020-05-05 DIAGNOSIS — I10 Essential (primary) hypertension: Secondary | ICD-10-CM | POA: Diagnosis not present

## 2020-05-05 DIAGNOSIS — J449 Chronic obstructive pulmonary disease, unspecified: Secondary | ICD-10-CM | POA: Diagnosis not present

## 2020-05-05 DIAGNOSIS — R609 Edema, unspecified: Secondary | ICD-10-CM

## 2020-05-05 DIAGNOSIS — I152 Hypertension secondary to endocrine disorders: Secondary | ICD-10-CM | POA: Insufficient documentation

## 2020-05-05 LAB — BAYER DCA HB A1C WAIVED: HB A1C (BAYER DCA - WAIVED): 7.2 % — ABNORMAL HIGH (ref <7.0)

## 2020-05-05 MED ORDER — LOSARTAN POTASSIUM 100 MG PO TABS: 100.0000 mg | ORAL_TABLET | Freq: Every day | ORAL | 0 refills | Status: DC

## 2020-05-05 MED ORDER — ROSUVASTATIN CALCIUM 5 MG PO TABS: 5.0000 mg | ORAL_TABLET | Freq: Every day | ORAL | 3 refills | Status: DC

## 2020-05-05 MED ORDER — ALBUTEROL SULFATE HFA 108 (90 BASE) MCG/ACT IN AERS: 2.0000 | INHALATION_SPRAY | Freq: Two times a day (BID) | RESPIRATORY_TRACT | 3 refills | Status: DC

## 2020-05-05 MED ORDER — AMLODIPINE BESYLATE 5 MG PO TABS: ORAL_TABLET | ORAL | 0 refills | Status: DC

## 2020-05-05 MED ORDER — TORSEMIDE 100 MG PO TABS: 100.0000 mg | ORAL_TABLET | Freq: Two times a day (BID) | ORAL | 3 refills | Status: DC

## 2020-05-05 MED ORDER — ALLOPURINOL 100 MG PO TABS: ORAL_TABLET | ORAL | 1 refills | Status: DC

## 2020-05-05 MED ORDER — POTASSIUM CHLORIDE CRYS ER 20 MEQ PO TBCR: EXTENDED_RELEASE_TABLET | ORAL | 1 refills | Status: DC

## 2020-05-05 MED ORDER — BUDESONIDE-FORMOTEROL FUMARATE 160-4.5 MCG/ACT IN AERO: INHALATION_SPRAY | RESPIRATORY_TRACT | 5 refills | Status: DC

## 2020-05-05 NOTE — Progress Notes (Addendum)
Subjective:  Patient ID: Juan Merritt,  male    DOB: 18-Sep-1951  Age: 69 y.o.    CC: Follow-up (3 month)   HPI Taksh Hjort Curd presents for  follow-up of hypertension. Patient has no history of headache chest pain or shortness of breath or recent cough. Patient also denies symptoms of TIA such as numbness weakness lateralizing. Patient denies side effects from medication. States taking it regularly.  Patient also  in for follow-up of elevated cholesterol. Doing well without complaints on current medication. Denies side effects  including myalgia and arthralgia and nausea. Also in today for liver function testing. Currently no chest pain, shortness of breath or other cardiovascular related symptoms noted.  Follow-up of diabetes. Patient does check blood sugar at home. Readings are done by his wife, flu is not here and he does not remember them. Patient denies symptoms such as excessive hunger or urinary frequency, excessive hunger, nausea No significant hypoglycemic spells noted. Medications reviewed. Pt reports taking them regularly. Pt. denies complication/adverse reaction today.    History Friedrich has a past medical history of Arthritis, COPD (chronic obstructive pulmonary disease) ([place]), Gallstones, Gout, Hypertension, Obesity, and Pneumonia.   He has a past surgical history that includes Cholecystectomy and Tonsillectomy.   His family history includes Asthma in his mother; Congestive Heart Failure in his mother; Diabetes in his father; Esophageal cancer in his paternal uncle; Heart attack in an other family member; Heart disease in his mother; Leukemia in his brother; Lung disease in an other family member.He reports that he quit smoking about 3 years ago. His smoking use included cigarettes. He has a 10.00 pack-year smoking history. His smokeless tobacco use includes chew. He reports current alcohol use. He reports that he does not use drugs.  Current Outpatient Medications on  File Prior to Visit  Medication Sig Dispense Refill  . predniSONE (DELTASONE) 10 MG tablet Take 10 mg by mouth 2 (two) times daily.    . [DISCONTINUED] bumetanide (BUMEX) 2 MG tablet Take 1 tablet (2 mg total) by mouth 2 (two) times daily. 60 tablet 0   No current facility-administered medications on file prior to visit.    ROS Review of Systems  Constitutional: Negative.   HENT: Negative.   Eyes: Negative for visual disturbance.  Respiratory: Negative for cough and shortness of breath.   Cardiovascular: Negative for chest pain and leg swelling.  Gastrointestinal: Negative for abdominal pain, diarrhea, nausea and vomiting.  Genitourinary: Negative for difficulty urinating.  Musculoskeletal: Positive for myalgias (right upper arm, bruised with recent injury, now healing). Negative for arthralgias.  Skin: Negative for rash.  Neurological: Negative for headaches.  Psychiatric/Behavioral: Negative for sleep disturbance.    Objective:  BP 116/73   Pulse 73   Temp 97.9 F (36.6 C) (Oral)   Ht '5\' 11"'  (1.803 m)   Wt 295 lb (133.8 kg)   BMI 41.14 kg/m   BP Readings from Last 3 Encounters:  05/05/20 116/73  02/04/20 102/66  11/27/19 122/83    Wt Readings from Last 3 Encounters:  05/05/20 295 lb (133.8 kg)  02/04/20 (!) 304 lb 3.2 oz (138 kg)  11/27/19 295 lb (133.8 kg)     Physical Exam Vitals reviewed.  Constitutional:      Appearance: He is well-developed.  HENT:     Head: Normocephalic and atraumatic.     Right Ear: External ear normal.     Left Ear: External ear normal.     Mouth/Throat:  Pharynx: No oropharyngeal exudate or posterior oropharyngeal erythema.  Eyes:     Pupils: Pupils are equal, round, and reactive to light.  Cardiovascular:     Rate and Rhythm: Normal rate and regular rhythm.     Heart sounds: No murmur heard.   Pulmonary:     Effort: Pulmonary effort is normal. No respiratory distress.  Musculoskeletal:     Cervical back: Normal range of  motion and neck supple.  Neurological:     Mental Status: He is alert and oriented to person, place, and time.         Assessment & Plan:   Dorrien was seen today for follow-up.  Diagnoses and all orders for this visit:  Essential hypertension, benign -     CBC with Differential/Platelet -     CMP14+EGFR -     amLODipine (NORVASC) 5 MG tablet; TAKE 1 TABLET BY MOUTH EVERY DAY -     losartan (COZAAR) 100 MG tablet; Take 1 tablet (100 mg total) by mouth daily. -     potassium chloride SA (KLOR-CON M20) 20 MEQ tablet; TAKE 1 TABLET (20 MEQ TOTAL) DAILY BY MOUTH.  COPD mixed type ([place]) -     CBC with Differential/Platelet -     CMP14+EGFR -     albuterol (VENTOLIN HFA) 108 (90 Base) MCG/ACT inhaler; Inhale 2 puffs into the lungs 2 (two) times daily. -     budesonide-formoterol (SYMBICORT) 160-4.5 MCG/ACT inhaler; TAKE 2 PUFFS BY MOUTH TWICE A DAY  Hypertension associated with diabetes (HCC) -     CBC with Differential/Platelet -     CMP14+EGFR -     Lipid panel -     Bayer DCA Hb A1c Waived -     losartan (COZAAR) 100 MG tablet; Take 1 tablet (100 mg total) by mouth daily. -     rosuvastatin (CRESTOR) 5 MG tablet; Take 1 tablet (5 mg total) by mouth daily. For cholesterol  Mixed hyperlipidemia -     Lipid panel  Morbid obesity (HCC)  BPH associated with nocturia  Edema, unspecified type -     torsemide (DEMADEX) 100 MG tablet; Take 1 tablet (100 mg total) by mouth 2 (two) times daily. On arising and at lunchtime  Hyperuricemia -     allopurinol (ZYLOPRIM) 100 MG tablet; TAKE 1 TABLET (100 MG TOTAL) BY MOUTH DAILY. TO PREVENT GOUT   I have discontinued [place]. Doughtie's meloxicam and doxycycline. I have also changed his albuterol, amLODipine, and losartan. Additionally, I am having him start on rosuvastatin. Lastly, I am having him maintain his predniSONE, allopurinol, budesonide-formoterol, potassium chloride SA, and torsemide.  Meds ordered this encounter    Medications  . albuterol (VENTOLIN HFA) 108 (90 Base) MCG/ACT inhaler    Sig: Inhale 2 puffs into the lungs 2 (two) times daily.    Dispense:  54 g    Refill:  3  . allopurinol (ZYLOPRIM) 100 MG tablet    Sig: TAKE 1 TABLET (100 MG TOTAL) BY MOUTH DAILY. TO PREVENT GOUT    Dispense:  90 tablet    Refill:  1  . amLODipine (NORVASC) 5 MG tablet    Sig: TAKE 1 TABLET BY MOUTH EVERY DAY    Dispense:  90 tablet    Refill:  0  . budesonide-formoterol (SYMBICORT) 160-4.5 MCG/ACT inhaler    Sig: TAKE 2 PUFFS BY MOUTH TWICE A DAY    Dispense:  30.6 Inhaler    Refill:  5  . losartan (  COZAAR) 100 MG tablet    Sig: Take 1 tablet (100 mg total) by mouth daily.    Dispense:  90 tablet    Refill:  0  . potassium chloride SA (KLOR-CON M20) 20 MEQ tablet    Sig: TAKE 1 TABLET (20 MEQ TOTAL) DAILY BY MOUTH.    Dispense:  90 tablet    Refill:  1  . torsemide (DEMADEX) 100 MG tablet    Sig: Take 1 tablet (100 mg total) by mouth 2 (two) times daily. On arising and at lunchtime    Dispense:  180 tablet    Refill:  3  . rosuvastatin (CRESTOR) 5 MG tablet    Sig: Take 1 tablet (5 mg total) by mouth daily. For cholesterol    Dispense:  90 tablet    Refill:  3     Follow-up: Return in about 3 months (around 08/05/2020).  [name], M.D.

## 2020-05-06 LAB — CMP14+EGFR
ALT: 32 IU/L (ref 0–44)
AST: 25 IU/L (ref 0–40)
Albumin/Globulin Ratio: 1.4 (ref 1.2–2.2)
Albumin: 4.2 g/dL (ref 3.8–4.8)
Alkaline Phosphatase: 61 IU/L (ref 48–121)
BUN/Creatinine Ratio: 27 — ABNORMAL HIGH (ref 10–24)
BUN: 27 mg/dL (ref 8–27)
Bilirubin Total: 0.3 mg/dL (ref 0.0–1.2)
CO2: 29 mmol/L (ref 20–29)
Calcium: 9.3 mg/dL (ref 8.6–10.2)
Chloride: 96 mmol/L (ref 96–106)
Creatinine, Ser: 1 mg/dL (ref 0.76–1.27)
GFR calc Af Amer: 90 mL/min/{1.73_m2} (ref >59)
GFR calc non Af Amer: 78 mL/min/{1.73_m2} (ref >59)
Globulin, Total: 3.1 g/dL (ref 1.5–4.5)
Glucose: 104 mg/dL — ABNORMAL HIGH (ref 65–99)
Potassium: 4 mmol/L (ref 3.5–5.2)
Sodium: 139 mmol/L (ref 134–144)
Total Protein: 7.3 g/dL (ref 6.0–8.5)

## 2020-05-06 LAB — CBC WITH DIFFERENTIAL/PLATELET
Basophils Absolute: 0.1 10*3/uL (ref 0.0–0.2)
Basos: 1 %
EOS (ABSOLUTE): 0 10*3/uL (ref 0.0–0.4)
Eos: 0 %
Hematocrit: 41.3 % (ref 37.5–51.0)
Hemoglobin: 13.5 g/dL (ref 13.0–17.7)
Immature Grans (Abs): 0 10*3/uL (ref 0.0–0.1)
Immature Granulocytes: 0 %
Lymphocytes Absolute: 4.6 10*3/uL — ABNORMAL HIGH (ref 0.7–3.1)
Lymphs: 40 %
MCH: 31.1 pg (ref 26.6–33.0)
MCHC: 32.7 g/dL (ref 31.5–35.7)
MCV: 95 fL (ref 79–97)
Monocytes Absolute: 0.9 10*3/uL (ref 0.1–0.9)
Monocytes: 8 %
Neutrophils Absolute: 5.8 10*3/uL (ref 1.4–7.0)
Neutrophils: 51 %
Platelets: 344 10*3/uL (ref 150–450)
RBC: 4.34 x10E6/uL (ref 4.14–5.80)
RDW: 13.2 % (ref 11.6–15.4)
WBC: 11.4 10*3/uL — ABNORMAL HIGH (ref 3.4–10.8)

## 2020-05-06 LAB — LIPID PANEL
Chol/HDL Ratio: 3.1 ratio (ref 0.0–5.0)
Cholesterol, Total: 154 mg/dL (ref 100–199)
HDL: 50 mg/dL (ref >39)
LDL Chol Calc (NIH): 73 mg/dL (ref 0–99)
Triglycerides: 188 mg/dL — ABNORMAL HIGH (ref 0–149)
VLDL Cholesterol Cal: 31 mg/dL (ref 5–40)

## 2020-05-06 NOTE — Progress Notes (Signed)
Hello Vicky,  Your lab result is normal and/or stable.Some minor variations that are not significant are commonly marked abnormal, but do not represent any medical problem for you.  Best regards, Meliana Canner, M.D.

## 2020-05-12 ENCOUNTER — Other Ambulatory Visit: Payer: Self-pay | Admitting: Family Medicine

## 2020-05-12 ENCOUNTER — Telehealth: Payer: Self-pay | Admitting: Family Medicine

## 2020-05-12 DIAGNOSIS — I2510 Atherosclerotic heart disease of native coronary artery without angina pectoris: Secondary | ICD-10-CM

## 2020-05-12 DIAGNOSIS — E782 Mixed hyperlipidemia: Secondary | ICD-10-CM

## 2020-05-12 DIAGNOSIS — E1159 Type 2 diabetes mellitus with other circulatory complications: Secondary | ICD-10-CM

## 2020-05-12 DIAGNOSIS — I152 Hypertension secondary to endocrine disorders: Secondary | ICD-10-CM

## 2020-05-12 DIAGNOSIS — F17210 Nicotine dependence, cigarettes, uncomplicated: Secondary | ICD-10-CM

## 2020-05-12 NOTE — Telephone Encounter (Signed)
Pts wife states that a referral to cardiology was to be placed a few months ago and she is checking status on that referral and an appt with cardiology being made. Requesting the office in [place] if possible.

## 2020-05-12 NOTE — Telephone Encounter (Signed)
I do not see where a Ref to Cardiology has been placed recently for this Patient?

## 2020-05-19 ENCOUNTER — Other Ambulatory Visit: Payer: Self-pay | Admitting: Family Medicine

## 2020-05-19 DIAGNOSIS — M25511 Pain in right shoulder: Secondary | ICD-10-CM

## 2020-06-02 ENCOUNTER — Other Ambulatory Visit: Payer: Self-pay | Admitting: Family Medicine

## 2020-06-02 ENCOUNTER — Telehealth: Payer: Self-pay | Admitting: Family Medicine

## 2020-06-02 MED ORDER — METFORMIN HCL ER 500 MG PO TB24: 500.0000 mg | ORAL_TABLET | Freq: Every day | ORAL | 2 refills | Status: DC

## 2020-06-02 NOTE — Telephone Encounter (Signed)
See below, please advise.  Patient's A1C was 7.2 on 05/05/2020. Patient is on no medication for diabetes.

## 2020-06-02 NOTE — Telephone Encounter (Signed)
Patients wife aware

## 2020-06-02 NOTE — Telephone Encounter (Signed)
A1c of 7.2 is good. Metformin can help make it even better. I will send that in.

## 2020-06-02 NOTE — Telephone Encounter (Signed)
Pts wife and states pts blood sugar is running between 110-115 fasting. She is wanting to know if he needs to come in for a A1C check before medication can be ordered or if medication can be ordered to fix his blood sugar without an appt.

## 2020-06-23 DIAGNOSIS — R9431 Abnormal electrocardiogram [ECG] [EKG]: Secondary | ICD-10-CM | POA: Insufficient documentation

## 2020-06-23 DIAGNOSIS — Z7189 Other specified counseling: Secondary | ICD-10-CM | POA: Insufficient documentation

## 2020-06-23 NOTE — Progress Notes (Signed)
Cardiology Office Note   Date:  06/24/2020   ID:  Juan Merritt, DOB [birth year], MRN [id number]  PCP:  [name], MD  Cardiologist:   No primary care provider on file. Referring:  [name], MD  No chief complaint on file.     History of Present Illness: Juan Merritt is a 69 y.o. male who was referred by [name], MD for evaluation of an abnormal EKG.  He also has significant dyspnea on exertion.   He did have Covid 19 last last year.  However, his breathing has been short since before then.  He is not very physically active has morbid obesity.  When he walks a short distance like 25 yards on level ground he will get short of breath.  He is chronically slept on the couch because he gets up to urinate too frequently.  He sleeps on 2 pillows.  He is not classically describing PND or orthopnea.  He has not been having any chest pressure, neck or arm discomfort.  He does not really notice palpitations and does not have presyncope or syncope.  He has been noted recently to have diabetes and started on therapy for this.  He was given a statin though his lipids.  He could not tolerate this.  He did have an echo in 2016 with normal EF.  This apparently was done when he was in the hospital with pneumonia.  He is otherwise had no prior cardiac work-up.   Past Medical History:  Diagnosis Date  . Arthritis   . COPD (chronic obstructive pulmonary disease) (HCC)    not on home o2  . Gallstones   . Gout   . Hypertension   . Obesity   . Pneumonia     Past Surgical History:  Procedure Laterality Date  . CHOLECYSTECTOMY    . TONSILLECTOMY       Current Outpatient Medications  Medication Sig Dispense Refill  . albuterol (VENTOLIN HFA) 108 (90 Base) MCG/ACT inhaler Inhale 2 puffs into the lungs 2 (two) times daily. 54 g 3  . allopurinol (ZYLOPRIM) 100 MG tablet TAKE 1 TABLET (100 MG TOTAL) BY MOUTH DAILY. TO PREVENT GOUT 90 tablet 1  . amLODipine (NORVASC) 5 MG  tablet TAKE 1 TABLET BY MOUTH EVERY DAY 90 tablet 0  . budesonide-formoterol (SYMBICORT) 160-4.5 MCG/ACT inhaler TAKE 2 PUFFS BY MOUTH TWICE A DAY 30.6 Inhaler 5  . losartan (COZAAR) 100 MG tablet Take 1 tablet (100 mg total) by mouth daily. 90 tablet 0  . meloxicam (MOBIC) 15 MG tablet Take 15 mg by mouth daily.    . metFORMIN (GLUCOPHAGE-XR) 500 MG 24 hr tablet Take 1 tablet (500 mg total) by mouth daily with breakfast. 30 tablet 2  . potassium chloride SA (KLOR-CON M20) 20 MEQ tablet TAKE 1 TABLET (20 MEQ TOTAL) DAILY BY MOUTH. 90 tablet 1  . torsemide (DEMADEX) 100 MG tablet Take 1 tablet (100 mg total) by mouth 2 (two) times daily. On arising and at lunchtime 180 tablet 3   No current facility-administered medications for this visit.    Allergies:   Patient has no known allergies.    Social History:  The patient  reports that he quit smoking about 3 years ago. His smoking use included cigarettes and e-cigarettes. He has a 10.00 pack-year smoking history. His smokeless tobacco use includes chew. He reports current alcohol use. He reports that he does not use drugs.   Family History:  The patient's family  history includes Asthma in his mother; Diabetes in his father; Esophageal cancer in his paternal uncle; Heart attack in an other family member; Heart attack (age of onset: 70) in his father; Leukemia in his brother; Lung disease in an other family member.    ROS:  Please see the history of present illness.   Otherwise, review of systems are positive for frequent urination.   All other systems are reviewed and negative.    PHYSICAL EXAM: VS:  BP 120/82   Pulse 96   Ht 5\' 11"  (1.803 m)   Wt 294 lb (133.4 kg)   BMI 41.00 kg/m  , BMI Body mass index is 41 kg/m. GENERAL:  Well appearing HEENT:  Pupils equal round and reactive, fundi not visualized, oral mucosa unremarkable NECK:  No jugular venous distention, waveform within normal limits, carotid upstroke brisk and symmetric, no  bruits, no thyromegaly LYMPHATICS:  No cervical, inguinal adenopathy LUNGS:  Clear to auscultation bilaterally BACK:  No CVA tenderness CHEST:  Unremarkable HEART:  PMI not displaced or sustained,S1 and S2 within normal limits, no S3, no S4, no clicks, no rubs, no murmurs ABD:  Flat, positive bowel sounds normal in frequency in pitch, no bruits, no rebound, no guarding, no midline pulsatile mass, no hepatomegaly, no splenomegaly EXT:  2 plus pulses throughout, no edema, no cyanosis no clubbing SKIN:  No rashes no nodules NEURO:  Cranial nerves II through XII grossly intact, motor grossly intact throughout PSYCH:  Cognitively intact, oriented to person place and time    EKG:  EKG is ordered today. The ekg ordered today demonstrates sinus rhythm, rate 96, premature atrial contractions, borderline interventricular conduction delay, no acute ST-T wave changes.   Recent Labs: 11/27/2019: B Natriuretic Peptide 73.0 05/05/2020: ALT 32; BUN 27; Creatinine, Ser 1.00; Hemoglobin 13.5; Platelets 344; Potassium 4.0; Sodium 139    Lipid Panel    Component Value Date/Time   CHOL 154 05/05/2020 0834   TRIG 188 (H) 05/05/2020 0834   HDL 50 05/05/2020 0834   CHOLHDL 3.1 05/05/2020 0834   LDLCALC 73 05/05/2020 0834      Wt Readings from Last 3 Encounters:  06/24/20 294 lb (133.4 kg)  05/05/20 295 lb (133.8 kg)  02/04/20 (!) 304 lb 3.2 oz (138 kg)      Other studies Reviewed: Additional studies/ records that were reviewed today include: Labs, echo. Review of the above records demonstrates:  Please see elsewhere in the note.     ASSESSMENT AND PLAN:  DOE: The patient has significant dyspnea on exertion.  I do note cardiomegaly on previous chest x-ray.  He had some vascular congestion noted last year.  Certainly he has weight and deconditioning which could explain his shortness of breath.  However, I am going to start with an echocardiogram.  I do note that BNP done last year was normal.  If  his echo is normal I will have him come back for a POET (Plain Old Exercise Treadmill) to screen for obstructive coronary disease.  ABNORMAL EKG: He has some PACs but no other significant findings and is not symptomatic with this.  HTN: Blood pressure is controlled.  No change in therapy.  DM: A1c was 7.1 recently.  He is newly started on Metformin.  We talked about diet and exercise.  COVID EDUCATION:  He has had Covid.  He also had the vaccine   Current medicines are reviewed at length with the patient today.  The patient does not have concerns regarding medicines.  The following changes have been made:  None  Labs/ tests ordered today include:   Orders Placed This Encounter  Procedures  . EKG 12-Lead  . ECHOCARDIOGRAM COMPLETE     Disposition:   FU with me as needed based on the results of the above.     Signed, Minus Breeding, MD  06/24/2020 7:24 PM    [facility]   +

## 2020-06-24 ENCOUNTER — Other Ambulatory Visit: Payer: Self-pay

## 2020-06-24 ENCOUNTER — Ambulatory Visit: Payer: Medicare HMO | Admitting: Cardiology

## 2020-06-24 ENCOUNTER — Encounter: Payer: Self-pay | Admitting: Cardiology

## 2020-06-24 VITALS — BP 120/82 | HR 96 | Ht 71.0 in | Wt 294.0 lb

## 2020-06-24 DIAGNOSIS — Z7189 Other specified counseling: Secondary | ICD-10-CM

## 2020-06-24 DIAGNOSIS — R0609 Other forms of dyspnea: Secondary | ICD-10-CM

## 2020-06-24 DIAGNOSIS — I10 Essential (primary) hypertension: Secondary | ICD-10-CM

## 2020-06-24 DIAGNOSIS — R0600 Dyspnea, unspecified: Secondary | ICD-10-CM | POA: Diagnosis not present

## 2020-06-24 DIAGNOSIS — R9431 Abnormal electrocardiogram [ECG] [EKG]: Secondary | ICD-10-CM | POA: Diagnosis not present

## 2020-06-24 NOTE — Patient Instructions (Signed)
Medication Instructions:  The current medical regimen is effective;  continue present plan and medications.  *If you need a refill on your cardiac medications before your next appointment, please call your pharmacy*  Testing/Procedures: Your physician has requested that you have an echocardiogram. Echocardiography is a painless test that uses sound waves to create images of your heart. It provides your doctor with information about the size and shape of your heart and how well your hearts chambers and valves are working. This procedure takes approximately one hour. There are no restrictions for this procedure.    Follow-Up: At [facility], you and your health needs are our priority.  As part of our continuing mission to provide you with exceptional heart care, we have created designated Provider Care Teams.  These Care Teams include your primary Cardiologist (physician) and Advanced Practice Providers (APPs -  Physician Assistants and Nurse Practitioners) who all work together to provide you with the care you need, when you need it.  We recommend signing up for the patient portal called "MyChart".  Sign up information is provided on this After Visit Summary.  MyChart is used to connect with patients for Virtual Visits (Telemedicine).  Patients are able to view lab/test results, encounter notes, upcoming appointments, etc.  Non-urgent messages can be sent to your provider as well.   To learn more about what you can do with MyChart, go to [email or web address].    Further follow up will be based on the results of the above testing.  Thank you for choosing [facility]!!

## 2020-06-25 ENCOUNTER — Telehealth: Payer: Self-pay | Admitting: Cardiology

## 2020-06-25 NOTE — Telephone Encounter (Signed)
Pre-cert Verification for the following procedure    ECHO   DATE:  07/02/2020  LOCATION: [facility]

## 2020-06-30 ENCOUNTER — Other Ambulatory Visit: Payer: Self-pay | Admitting: Family Medicine

## 2020-07-02 ENCOUNTER — Other Ambulatory Visit: Payer: Self-pay

## 2020-07-02 ENCOUNTER — Ambulatory Visit (HOSPITAL_COMMUNITY)
Admission: RE | Admit: 2020-07-02 | Discharge: 2020-07-02 | Disposition: A | Discharge status: Home / Self Care | Payer: Medicare HMO | Source: Ambulatory Visit | Attending: Cardiology | Admitting: Cardiology

## 2020-07-02 DIAGNOSIS — I10 Essential (primary) hypertension: Secondary | ICD-10-CM | POA: Diagnosis not present

## 2020-07-02 DIAGNOSIS — R9431 Abnormal electrocardiogram [ECG] [EKG]: Secondary | ICD-10-CM | POA: Insufficient documentation

## 2020-07-02 NOTE — Progress Notes (Signed)
*  PRELIMINARY RESULTS* Echocardiogram 2D Echocardiogram has been performed.  Juan Merritt 07/02/2020, 9:17 AM

## 2020-07-03 ENCOUNTER — Telehealth: Payer: Self-pay | Admitting: *Deleted

## 2020-07-03 DIAGNOSIS — R9431 Abnormal electrocardiogram [ECG] [EKG]: Secondary | ICD-10-CM

## 2020-07-03 DIAGNOSIS — R0609 Other forms of dyspnea: Secondary | ICD-10-CM

## 2020-07-03 DIAGNOSIS — I10 Essential (primary) hypertension: Secondary | ICD-10-CM

## 2020-07-03 NOTE — Telephone Encounter (Signed)
Advised of Echo results Orders placed for ETT and message sent to schedulers to arrange

## 2020-07-03 NOTE — Telephone Encounter (Signed)
-----   Message from Minus Breeding, MD sent at 07/02/2020 10:05 PM EDT ----- Echo is with normal EF and no signficant valvular abnormalities.  Call Mr. Tuazon with the results and send results to [name], MD.  He needs a POET (Plain Old Exercise Treadmill).

## 2020-07-13 ENCOUNTER — Telehealth (HOSPITAL_COMMUNITY): Payer: Self-pay | Admitting: Cardiology

## 2020-07-13 NOTE — Telephone Encounter (Signed)
Routed to MD to review

## 2020-07-13 NOTE — Telephone Encounter (Signed)
I called to schedule patient GXT ordered per Dr. Percival Spanish and he declined to schedule due to he has COPD and states that Dr. Percival Spanish mentioned him having a different kind of stress test... Please can you check and if so update order to reflect and I will schedule.

## 2020-07-14 NOTE — Telephone Encounter (Signed)
Per pt will have wife call back to decide if can do GXT or need to schedule lexiscan ./cy

## 2020-07-20 ENCOUNTER — Other Ambulatory Visit: Payer: Self-pay

## 2020-07-20 DIAGNOSIS — R0609 Other forms of dyspnea: Secondary | ICD-10-CM

## 2020-07-20 NOTE — Telephone Encounter (Signed)
OK 

## 2020-07-20 NOTE — Telephone Encounter (Signed)
Patient calling to schedule LEXI instead of ETT.  Will make sure with Dr.Hochrein okay to order before sending to scheduling.   Thanks!

## 2020-07-20 NOTE — Telephone Encounter (Signed)
Follow up  Pt is calling and says he would like to have a Lexi scan stress test   Please call

## 2020-07-20 NOTE — Telephone Encounter (Signed)
I have ordered LEXI-  Please schedule and notify patient.  Thank you!

## 2020-07-21 ENCOUNTER — Other Ambulatory Visit: Payer: Self-pay | Admitting: Family Medicine

## 2020-07-21 DIAGNOSIS — I10 Essential (primary) hypertension: Secondary | ICD-10-CM

## 2020-07-24 ENCOUNTER — Telehealth (HOSPITAL_COMMUNITY): Payer: Self-pay

## 2020-07-24 NOTE — Telephone Encounter (Signed)
Encounter complete. 

## 2020-07-30 ENCOUNTER — Other Ambulatory Visit: Payer: Self-pay

## 2020-07-30 ENCOUNTER — Ambulatory Visit (HOSPITAL_COMMUNITY)
Admission: RE | Admit: 2020-07-30 | Discharge: 2020-07-30 | Disposition: A | Discharge status: Home / Self Care | Payer: Medicare HMO | Source: Ambulatory Visit | Attending: Cardiology | Admitting: Cardiology

## 2020-07-30 DIAGNOSIS — R0600 Dyspnea, unspecified: Secondary | ICD-10-CM | POA: Diagnosis not present

## 2020-07-30 DIAGNOSIS — R0609 Other forms of dyspnea: Secondary | ICD-10-CM

## 2020-07-30 MED ORDER — TECHNETIUM TC 99M TETROFOSMIN IV KIT
30.2000 | PACK | Freq: Once | INTRAVENOUS | Status: AC | PRN
  Administered 2020-07-30: 30.2 via INTRAVENOUS
  Filled 2020-07-30: qty 31

## 2020-07-30 MED ORDER — REGADENOSON 0.4 MG/5ML IV SOLN
0.4000 mg | Freq: Once | INTRAVENOUS | Status: AC
Start: 2020-07-30 — End: 2020-07-30
  Administered 2020-07-30: 0.4 mg via INTRAVENOUS

## 2020-07-31 ENCOUNTER — Ambulatory Visit (HOSPITAL_COMMUNITY)
Admission: RE | Admit: 2020-07-31 | Discharge: 2020-07-31 | Disposition: A | Discharge status: Home / Self Care | Payer: Medicare HMO | Source: Ambulatory Visit | Attending: Cardiovascular Disease | Admitting: Cardiovascular Disease

## 2020-07-31 LAB — MYOCARDIAL PERFUSION IMAGING
LV dias vol: 115 mL (ref 62–150)
LV sys vol: 55 mL
Peak HR: 110 {beats}/min
Rest HR: 93 {beats}/min
SDS: 4
SRS: 3
SSS: 7
TID: 0.86

## 2020-07-31 MED ORDER — TECHNETIUM TC 99M TETROFOSMIN IV KIT
30.7000 | PACK | Freq: Once | INTRAVENOUS | Status: AC | PRN
  Administered 2020-07-31: 30.7 via INTRAVENOUS

## 2020-08-05 ENCOUNTER — Ambulatory Visit (INDEPENDENT_AMBULATORY_CARE_PROVIDER_SITE_OTHER): Payer: Medicare HMO | Admitting: Family Medicine

## 2020-08-05 ENCOUNTER — Other Ambulatory Visit: Payer: Self-pay

## 2020-08-05 ENCOUNTER — Encounter: Payer: Self-pay | Admitting: Family Medicine

## 2020-08-05 VITALS — BP 120/80 | HR 92 | Temp 97.6°F | Resp 20 | Ht 71.0 in | Wt 284.5 lb

## 2020-08-05 DIAGNOSIS — Z1211 Encounter for screening for malignant neoplasm of colon: Secondary | ICD-10-CM | POA: Diagnosis not present

## 2020-08-05 DIAGNOSIS — I10 Essential (primary) hypertension: Secondary | ICD-10-CM

## 2020-08-05 DIAGNOSIS — E1159 Type 2 diabetes mellitus with other circulatory complications: Secondary | ICD-10-CM

## 2020-08-05 DIAGNOSIS — E119 Type 2 diabetes mellitus without complications: Secondary | ICD-10-CM

## 2020-08-05 DIAGNOSIS — J449 Chronic obstructive pulmonary disease, unspecified: Secondary | ICD-10-CM

## 2020-08-05 DIAGNOSIS — I152 Hypertension secondary to endocrine disorders: Secondary | ICD-10-CM

## 2020-08-05 DIAGNOSIS — E782 Mixed hyperlipidemia: Secondary | ICD-10-CM | POA: Diagnosis not present

## 2020-08-05 LAB — BAYER DCA HB A1C WAIVED: HB A1C (BAYER DCA - WAIVED): 6.1 % (ref <7.0)

## 2020-08-05 MED ORDER — BUDESONIDE-FORMOTEROL FUMARATE 160-4.5 MCG/ACT IN AERO: 2.0000 | INHALATION_SPRAY | Freq: Two times a day (BID) | RESPIRATORY_TRACT | 1 refills | Status: DC

## 2020-08-05 MED ORDER — MELOXICAM 15 MG PO TABS: 15.0000 mg | ORAL_TABLET | Freq: Every day | ORAL | 1 refills | Status: DC

## 2020-08-05 MED ORDER — METFORMIN HCL ER 500 MG PO TB24: 500.0000 mg | ORAL_TABLET | Freq: Every day | ORAL | 1 refills | Status: DC

## 2020-08-05 MED ORDER — AMLODIPINE BESYLATE 5 MG PO TABS: ORAL_TABLET | ORAL | 0 refills | Status: DC

## 2020-08-05 MED ORDER — LOSARTAN POTASSIUM 100 MG PO TABS: 100.0000 mg | ORAL_TABLET | Freq: Every day | ORAL | 0 refills | Status: DC

## 2020-08-05 NOTE — Progress Notes (Signed)
Subjective:  Patient ID: Juan Merritt,  male    DOB: 1951/01/10  Age: 69 y.o.    CC: Medical Management of Chronic Issues   HPI Juan Merritt presents for  follow-up of hypertension. Patient has no history of headache chest pain or shortness of breath or recent cough. Patient also denies symptoms of TIA such as numbness weakness lateralizing. Patient denies side effects from medication. States taking it regularly.  Patient also  in for follow-up of elevated cholesterol. Doing well without complaints on current medication. Denies side effects  including myalgia and arthralgia and nausea. Also in today for liver function testing. Currently no chest pain, shortness of breath or other cardiovascular related symptoms noted.  Follow-up of diabetes. Patient does check blood sugar at home. No highs or lows. Working on weight loss. Down 10b since last check.  Patient denies symptoms such as excessive hunger or urinary frequency, excessive hunger, nausea No significant hypoglycemic spells noted. Medications reviewed. Pt reports taking them regularly. Pt. denies complication/adverse reaction today.    History Juan Merritt has a past medical history of Arthritis, COPD (chronic obstructive pulmonary disease) (Juan Merritt), Gallstones, Gout, Hypertension, Obesity, and Pneumonia.   He has a past surgical history that includes Cholecystectomy and Tonsillectomy.   His family history includes Asthma in his mother; Diabetes in his father; Esophageal cancer in his paternal uncle; Heart attack in an other family member; Heart attack (age of onset: 12) in his father; Leukemia in his brother; Lung disease in an other family member.He reports that he quit smoking about 3 years ago. His smoking use included cigarettes and e-cigarettes. He has a 10.00 pack-year smoking history. His smokeless tobacco use includes chew. He reports current alcohol use. He reports that he does not use drugs.  Current Outpatient Medications  on File Prior to Visit  Medication Sig Dispense Refill  . allopurinol (ZYLOPRIM) 100 MG tablet TAKE 1 TABLET (100 MG TOTAL) BY MOUTH DAILY. TO PREVENT GOUT 90 tablet 1  . potassium chloride SA (KLOR-CON M20) 20 MEQ tablet TAKE 1 TABLET (20 MEQ TOTAL) DAILY BY MOUTH. 90 tablet 1  . torsemide (DEMADEX) 100 MG tablet Take 1 tablet (100 mg total) by mouth 2 (two) times daily. On arising and at lunchtime 180 tablet 3  . albuterol (VENTOLIN HFA) 108 (90 Base) MCG/ACT inhaler Inhale 2 puffs into the lungs 2 (two) times daily. (Patient not taking: Reported on 08/05/2020) 54 g 3  . [DISCONTINUED] bumetanide (BUMEX) 2 MG tablet Take 1 tablet (2 mg total) by mouth 2 (two) times daily. 60 tablet 0   No current facility-administered medications on file prior to visit.    ROS Review of Systems  Constitutional: Negative.   HENT: Negative.   Eyes: Negative for visual disturbance.  Respiratory: Negative for cough and shortness of breath (stable on current regimen).   Cardiovascular: Negative for chest pain and leg swelling.  Gastrointestinal: Negative for abdominal pain, diarrhea, nausea and vomiting.  Genitourinary: Negative for difficulty urinating.  Musculoskeletal: Negative for arthralgias and myalgias.  Skin: Negative for rash.  Neurological: Negative for headaches.  Psychiatric/Behavioral: Negative for sleep disturbance.    Objective:  BP 120/80   Pulse 92   Temp 97.6 F (36.4 C) (Temporal)   Resp 20   Ht '5\' 11"'  (1.803 m)   Wt 284 lb 8 oz (129 kg)   SpO2 100%   BMI 39.68 kg/m   BP Readings from Last 3 Encounters:  08/05/20 120/80  06/24/20 120/82  05/05/20  116/73    Wt Readings from Last 3 Encounters:  08/05/20 284 lb 8 oz (129 kg)  07/30/20 294 lb (133.4 kg)  06/24/20 294 lb (133.4 kg)     Physical Exam Vitals reviewed.  Constitutional:      Appearance: He is well-developed.  HENT:     Head: Normocephalic and atraumatic.     Right Ear: Tympanic membrane and external ear  normal. No decreased hearing noted.     Left Ear: Tympanic membrane and external ear normal. No decreased hearing noted.     Mouth/Throat:     Pharynx: No oropharyngeal exudate or posterior oropharyngeal erythema.  Eyes:     Pupils: Pupils are equal, round, and reactive to light.  Cardiovascular:     Rate and Rhythm: Normal rate and regular rhythm.     Heart sounds: No murmur heard.   Pulmonary:     Effort: No respiratory distress.     Breath sounds: Normal breath sounds.  Abdominal:     General: Bowel sounds are normal.     Palpations: Abdomen is soft. There is no mass.     Tenderness: There is no abdominal tenderness.  Musculoskeletal:     Cervical back: Normal range of motion and neck supple.     Diabetic Foot Exam - Simple   No data filed        Assessment & Plan:   Juan Merritt was seen today for medical management of chronic issues.  Diagnoses and all orders for this visit:  Hypertension associated with diabetes (Juan Merritt) -     CBC with Differential/Platelet -     CMP14+EGFR -     Lipid panel -     losartan (COZAAR) 100 MG tablet; Take 1 tablet (100 mg total) by mouth daily.  Mixed hyperlipidemia -     CBC with Differential/Platelet -     CMP14+EGFR -     Lipid panel  Type 2 diabetes mellitus without complication, without long-term current use of insulin (HCC) -     Bayer DCA Hb A1c Waived -     Microalbumin / creatinine urine ratio -     CBC with Differential/Platelet -     CMP14+EGFR -     Lipid panel  Essential hypertension, benign -     amLODipine (NORVASC) 5 MG tablet; TAKE 1 TABLET BY MOUTH EVERY DAY -     losartan (COZAAR) 100 MG tablet; Take 1 tablet (100 mg total) by mouth daily.  COPD mixed type (Juan Merritt) -     budesonide-formoterol (SYMBICORT) 160-4.5 MCG/ACT inhaler; Inhale 2 puffs into the lungs in the morning and at bedtime. TAKE 2 PUFFS BY MOUTH TWICE A DAY  Screen for colon cancer -     Cologuard  Other orders -     meloxicam (MOBIC) 15 MG  tablet; Take 1 tablet (15 mg total) by mouth daily. -     metFORMIN (GLUCOPHAGE-XR) 500 MG 24 hr tablet; Take 1 tablet (500 mg total) by mouth daily with breakfast.   I have changed Benson Setting. Juan Merritt's budesonide-formoterol and meloxicam. I am also having him maintain his albuterol, allopurinol, potassium chloride SA, torsemide, amLODipine, losartan, and metFORMIN.  Meds ordered this encounter  Medications  . amLODipine (NORVASC) 5 MG tablet    Sig: TAKE 1 TABLET BY MOUTH EVERY DAY    Dispense:  90 tablet    Refill:  0  . budesonide-formoterol (SYMBICORT) 160-4.5 MCG/ACT inhaler    Sig: Inhale 2 puffs into the lungs in  the morning and at bedtime. TAKE 2 PUFFS BY MOUTH TWICE A DAY    Dispense:  3 each    Refill:  1  . losartan (COZAAR) 100 MG tablet    Sig: Take 1 tablet (100 mg total) by mouth daily.    Dispense:  90 tablet    Refill:  0  . meloxicam (MOBIC) 15 MG tablet    Sig: Take 1 tablet (15 mg total) by mouth daily.    Dispense:  90 tablet    Refill:  1    DX Code Needed  .  . metFORMIN (GLUCOPHAGE-XR) 500 MG 24 hr tablet    Sig: Take 1 tablet (500 mg total) by mouth daily with breakfast.    Dispense:  90 tablet    Refill:  1     Follow-up: Return in about 6 months (around 02/05/2021) for Compete physical, diabetes, hypertension, COPD, cholesterol.  [name], M.D.

## 2020-08-06 LAB — CBC WITH DIFFERENTIAL/PLATELET
Basophils Absolute: 0 10*3/uL (ref 0.0–0.2)
Basos: 1 %
EOS (ABSOLUTE): 0.2 10*3/uL (ref 0.0–0.4)
Eos: 2 %
Hematocrit: 42.8 % (ref 37.5–51.0)
Hemoglobin: 14.2 g/dL (ref 13.0–17.7)
Immature Grans (Abs): 0 10*3/uL (ref 0.0–0.1)
Immature Granulocytes: 0 %
Lymphocytes Absolute: 3.5 10*3/uL — ABNORMAL HIGH (ref 0.7–3.1)
Lymphs: 43 %
MCH: 31.4 pg (ref 26.6–33.0)
MCHC: 33.2 g/dL (ref 31.5–35.7)
MCV: 95 fL (ref 79–97)
Monocytes Absolute: 0.8 10*3/uL (ref 0.1–0.9)
Monocytes: 10 %
Neutrophils Absolute: 3.6 10*3/uL (ref 1.4–7.0)
Neutrophils: 44 %
Platelets: 289 10*3/uL (ref 150–450)
RBC: 4.52 x10E6/uL (ref 4.14–5.80)
RDW: 13.1 % (ref 11.6–15.4)
WBC: 8 10*3/uL (ref 3.4–10.8)

## 2020-08-06 LAB — CMP14+EGFR
ALT: 115 IU/L — ABNORMAL HIGH (ref 0–44)
AST: 79 IU/L — ABNORMAL HIGH (ref 0–40)
Albumin/Globulin Ratio: 1.6 (ref 1.2–2.2)
Albumin: 4.5 g/dL (ref 3.8–4.8)
Alkaline Phosphatase: 58 IU/L (ref 48–121)
BUN/Creatinine Ratio: 16 (ref 10–24)
BUN: 20 mg/dL (ref 8–27)
Bilirubin Total: 0.5 mg/dL (ref 0.0–1.2)
CO2: 29 mmol/L (ref 20–29)
Calcium: 9.3 mg/dL (ref 8.6–10.2)
Chloride: 97 mmol/L (ref 96–106)
Creatinine, Ser: 1.23 mg/dL (ref 0.76–1.27)
GFR calc Af Amer: 70 mL/min/{1.73_m2} (ref >59)
GFR calc non Af Amer: 60 mL/min/{1.73_m2} (ref >59)
Globulin, Total: 2.8 g/dL (ref 1.5–4.5)
Glucose: 107 mg/dL — ABNORMAL HIGH (ref 65–99)
Potassium: 3.7 mmol/L (ref 3.5–5.2)
Sodium: 141 mmol/L (ref 134–144)
Total Protein: 7.3 g/dL (ref 6.0–8.5)

## 2020-08-06 LAB — LIPID PANEL
Chol/HDL Ratio: 4.2 ratio (ref 0.0–5.0)
Cholesterol, Total: 186 mg/dL (ref 100–199)
HDL: 44 mg/dL (ref >39)
LDL Chol Calc (NIH): 111 mg/dL — ABNORMAL HIGH (ref 0–99)
Triglycerides: 178 mg/dL — ABNORMAL HIGH (ref 0–149)
VLDL Cholesterol Cal: 31 mg/dL (ref 5–40)

## 2020-08-14 ENCOUNTER — Other Ambulatory Visit: Payer: Self-pay | Admitting: Family Medicine

## 2020-08-17 ENCOUNTER — Other Ambulatory Visit: Payer: Self-pay

## 2020-08-17 ENCOUNTER — Ambulatory Visit (INDEPENDENT_AMBULATORY_CARE_PROVIDER_SITE_OTHER): Payer: Medicare HMO | Admitting: *Deleted

## 2020-08-17 DIAGNOSIS — Z0000 Encounter for general adult medical examination without abnormal findings: Secondary | ICD-10-CM

## 2020-08-17 NOTE — Progress Notes (Addendum)
MEDICARE ANNUAL WELLNESS VISIT  08/17/2020  Telephone Visit Disclaimer This Medicare AWV was conducted by telephone due to national recommendations for restrictions regarding the COVID-19 Pandemic (e.g. social distancing).  I verified, using two identifiers, that I am speaking with Juan Merritt or their authorized healthcare agent. I discussed the limitations, risks, security, and privacy concerns of performing an evaluation and management service by telephone and the potential availability of an in-person appointment in the future. The patient expressed understanding and agreed to proceed.   Subjective:  Juan Merritt is a 69 y.o. male patient of Stacks, Juan Gash, MD who had a Medicare Annual Wellness Visit today via telephone. Aviyon is Retired and lives with their spouse. he has 5 children. he reports that he is socially active and does interact with friends/family regularly. he is not physically active and enjoys tv only.  Patient Care Team: [name], MD as PCP - General (Family Medicine)  Advanced Directives 08/17/2020 11/27/2019 08/14/2019 03/30/2018 07/20/2015 03/23/2015  Does Patient Have a Medical Advance Directive? Yes Yes No Yes No No  Type of Advance Directive Living will [place]  Does patient want to make changes to medical advance directive? No - Patient declined No - Patient declined - No - Patient declined - -  Copy of [place] in Chart? - No - copy requested - No - copy requested - -  Would patient like information on creating a medical advance directive? - - No - Patient declined - - No - patient declined information    Hospital Utilization Over the Past 12 Months: # of hospitalizations or ER visits: 0 # of surgeries: 0  Review of Systems    Patient reports that his overall health is unchanged compared to last year.  History obtained from chart review and the  patient  Patient Reported Readings (BP, Pulse, CBG, Weight, etc) none  Pain Assessment Pain : No/denies pain     Current Medications & Allergies (verified) Allergies as of 08/17/2020   No Known Allergies     Medication List       Accurate as of August 17, 2020  8:55 AM. If you have any questions, ask your nurse or doctor.        albuterol 108 (90 Base) MCG/ACT inhaler Commonly known as: VENTOLIN HFA Inhale 2 puffs into the lungs 2 (two) times daily.   allopurinol 100 MG tablet Commonly known as: ZYLOPRIM TAKE 1 TABLET (100 MG TOTAL) BY MOUTH DAILY. TO PREVENT GOUT   amLODipine 5 MG tablet Commonly known as: NORVASC TAKE 1 TABLET BY MOUTH EVERY DAY   budesonide-formoterol 160-4.5 MCG/ACT inhaler Commonly known as: Symbicort Inhale 2 puffs into the lungs in the morning and at bedtime. TAKE 2 PUFFS BY MOUTH TWICE A DAY   losartan 100 MG tablet Commonly known as: COZAAR Take 1 tablet (100 mg total) by mouth daily.   meloxicam 15 MG tablet Commonly known as: MOBIC Take 1 tablet (15 mg total) by mouth daily.   metFORMIN 500 MG 24 hr tablet Commonly known as: GLUCOPHAGE-XR Take 1 tablet (500 mg total) by mouth daily with breakfast.   potassium chloride SA 20 MEQ tablet Commonly known as: Klor-Con M20 TAKE 1 TABLET (20 MEQ TOTAL) DAILY BY MOUTH.   torsemide 100 MG tablet Commonly known as: DEMADEX Take 1 tablet (100 mg total) by mouth 2 (two) times daily. On arising and at lunchtime  History (reviewed): Past Medical History:  Diagnosis Date  . Arthritis   . COPD (chronic obstructive pulmonary disease) (HCC)    not on home o2  . Gallstones   . Gout   . Hypertension   . Obesity   . Pneumonia    Past Surgical History:  Procedure Laterality Date  . CHOLECYSTECTOMY    . TONSILLECTOMY     Family History  Problem Relation Age of Onset  . Asthma Mother   . Diabetes Father   . Heart attack Father 34  . Esophageal cancer Paternal Uncle   . Lung  disease Other        pat cousin  . Leukemia Brother   . Heart attack Other        pat cousin  . Colon cancer Neg Hx    Social History   Socioeconomic History  . Marital status: Married    Spouse name: Juan Merritt  . Number of children: 5  . Years of education: 10  . Highest education level: 10th grade  Occupational History  . Occupation: retired Nurse, mental health  . Smoking status: Former Smoker    Packs/day: 0.25    Years: 40.00    Pack years: 10.00    Types: Cigarettes, E-cigarettes    Quit date: 01/15/2017    Years since quitting: 3.5  . Smokeless tobacco: Current User    Types: Chew  Vaping Use  . Vaping Use: Some days  Substance and Sexual Activity  . Alcohol use: Yes    Comment: 1-2 beers occasionally   . Drug use: No  . Sexual activity: Not Currently  Other Topics Concern  . Not on file  Social History Narrative   Lives at home with wife.  Grown children.  Retired Pharmacist, community.    Social Determinants of Health   Financial Resource Strain: Low Risk   . Difficulty of Paying Living Expenses: Not hard at all  Food Insecurity: No Food Insecurity  . Worried About Charity fundraiser in the Last Year: Never true  . Ran Out of Food in the Last Year: Never true  Transportation Needs: No Transportation Needs  . Lack of Transportation (Medical): No  . Lack of Transportation (Non-Medical): No  Physical Activity: Inactive  . Days of Exercise per Week: 0 days  . Minutes of Exercise per Session: 0 min  Stress: No Stress Concern Present  . Feeling of Stress : Not at all  Social Connections: Moderately Integrated  . Frequency of Communication with Friends and Family: More than three times a week  . Frequency of Social Gatherings with Friends and Family: More than three times a week  . Attends Religious Services: 1 to 4 times per year  . Active Member of Clubs or Organizations: No  . Attends Archivist Meetings: Never  . Marital Status: Married    Activities  of Daily Living In your present state of health, do you have any difficulty performing the following activities: 08/17/2020  Hearing? N  Vision? N  Difficulty concentrating or making decisions? N  Walking or climbing stairs? N  Dressing or bathing? N  Doing errands, shopping? N  Preparing Food and eating ? Y  Using the Toilet? Y  In the past six months, have you accidently leaked urine? Y  Do you have problems with loss of bowel control? Y  Managing your Medications? Y  Managing your Finances? Y  Housekeeping or managing your Housekeeping? Y  Some recent data might  be hidden    Patient Education/ Literacy How often do you need to have someone help you when you read instructions, pamphlets, or other written materials from your doctor or pharmacy?: 1 - Never What is the last grade level you completed in school?: 10  Exercise Current Exercise Habits: The patient does not participate in regular exercise at present, Exercise limited by: None identified  Diet Patient reports consuming 2 meals a day and 0 snack(s) a day Patient reports that his primary diet is: Regular Patient reports that she does have regular access to food.   Depression Screen PHQ 2/9 Scores 08/17/2020 08/05/2020 05/05/2020 02/04/2020 09/23/2019 08/21/2019 08/14/2019  PHQ - 2 Score 0 0 0 0 0 0 0  PHQ- 9 Score - - - - - - -     Fall Risk Fall Risk  08/17/2020 08/05/2020 05/05/2020 02/04/2020 09/23/2019  Falls in the past year? 0 0 0 0 0  Number falls in past yr: - - 0 0 -  Injury with Fall? - - 0 0 -  Risk for fall due to : - - No Fall Risks No Fall Risks -  Follow up - Falls evaluation completed Falls evaluation completed - -  Comment - - - - -     Objective:  Benson Setting Brucks seemed alert and oriented and he participated appropriately during our telephone visit.  Blood Pressure Weight BMI  BP Readings from Last 3 Encounters:  08/05/20 120/80  06/24/20 120/82  05/05/20 116/73   Wt Readings from Last 3 Encounters:   08/05/20 284 lb 8 oz (129 kg)  07/30/20 294 lb (133.4 kg)  06/24/20 294 lb (133.4 kg)   BMI Readings from Last 1 Encounters:  08/05/20 39.68 kg/m    *Unable to obtain current vital signs, weight, and BMI due to telephone visit type  Hearing/Vision  . Cassell did not seem to have difficulty with hearing/understanding during the telephone conversation . Reports that he has had a formal eye exam by an eye care professional within the past year . Reports that he has not had a formal hearing evaluation within the past year *Unable to fully assess hearing and vision during telephone visit type  Cognitive Function: 6CIT Screen 08/17/2020 08/14/2019  What Year? 0 points 0 points  What month? 0 points 0 points  What time? 0 points 0 points  Count back from 20 0 points 0 points  Months in reverse 0 points 2 points  Repeat phrase 0 points 2 points  Total Score 0 4   (Normal:0-7, Significant for Dysfunction: >8)  Normal Cognitive Function Screening: Yes   Immunization & Health Maintenance Record Immunization History  Administered Date(s) Administered  . Fluad Quad(high Dose 65+) 09/23/2019  . Influenza,inj,Quad PF,6+ Mos 10/14/2016  . Moderna SARS-COVID-2 Vaccination 02/26/2020, 03/25/2020  . Pneumococcal Polysaccharide-23 09/07/2015    Health Maintenance  Topic Date Due  . FOOT EXAM  Never done  . OPHTHALMOLOGY EXAM  Never done  . COLONOSCOPY  Never done  . INFLUENZA VACCINE  07/19/2020  . TETANUS/TDAP  09/22/2020 (Originally 07/20/1970)  . PNA vac Low Risk Adult (1 of 2 - PCV13) 05/05/2021 (Originally 07/20/2018)  . HEMOGLOBIN A1C  02/05/2021  . COVID-19 Vaccine  Completed  . Hepatitis C Screening  Completed       Assessment  This is a routine wellness examination for Tibor Lemmons Afonso.  Health Maintenance: Due or Overdue Health Maintenance Due  Topic Date Due  . FOOT EXAM  Never done  .  OPHTHALMOLOGY EXAM  Never done  . COLONOSCOPY  Never done  . INFLUENZA VACCINE   07/19/2020    Benson Setting Kubly does not need a referral for Community Assistance: Care Management:   no Social Work:    no Prescription Assistance:  no Nutrition/Diabetes Education:  no   Plan:  Personalized Goals Goals Addressed            This Visit's Progress   . HEMOGLOBIN A1C < 7      . Weight (lb) < 250 lb (113.4 kg)        Personalized Health Maintenance & Screening Recommendations  Pneumococcal vaccine  Colorectal cancer screening shingles vaccine  Lung Cancer Screening Recommended: yes (Low Dose CT Chest recommended if Age 93-80 years, 30 pack-year currently smoking OR have quit w/in past 15 years) Hepatitis C Screening recommended: no HIV Screening recommended: no  Advanced Directives: Written information was not prepared per patient's request.  Referrals & Orders No orders of the defined types were placed in this encounter.   Follow-up Plan . Follow-up with [name], MD as planned on 02/08/2021 . Consider Chest CT since pt smoked over 40 years, last one was 2016. Marland Kitchen Pt needs colonoscopy or cologuard  . Needs Tdap, Prevnar and shingrix . Covid vaccine complete . No hearing difficulties . Wears reading glasses only, had eye exam at [facility] in June 2020, records requested. . Advanced directives: Living will in place, copy requested . Pt has lab only appt on  08/28/20     I have personally reviewed and noted the following in the patient's chart:   . Medical and social history . Use of alcohol, tobacco or illicit drugs  . Current medications and supplements . Functional ability and status . Nutritional status . Physical activity . Advanced directives . List of other physicians . Hospitalizations, surgeries, and ER visits in previous 12 months . Vitals . Screenings to include cognitive, depression, and falls . Referrals and appointments  In addition, I have reviewed and discussed with Benson Setting [place] certain preventive protocols,  quality metrics, and best practice recommendations. A written personalized care plan for preventive services as well as general preventive health recommendations is available and can be mailed to the patient at his request.    AVS printed and mailed to patient.  Faylene Million [name],LPN  [id number]

## 2020-08-28 ENCOUNTER — Other Ambulatory Visit: Payer: Self-pay | Admitting: *Deleted

## 2020-08-28 ENCOUNTER — Other Ambulatory Visit: Payer: Self-pay

## 2020-08-28 ENCOUNTER — Other Ambulatory Visit: Payer: Medicare HMO

## 2020-08-28 DIAGNOSIS — R7989 Other specified abnormal findings of blood chemistry: Secondary | ICD-10-CM

## 2020-08-29 LAB — CMP14+EGFR
ALT: 63 IU/L — ABNORMAL HIGH (ref 0–44)
AST: 37 IU/L (ref 0–40)
Albumin/Globulin Ratio: 1.5 (ref 1.2–2.2)
Albumin: 4.2 g/dL (ref 3.8–4.8)
Alkaline Phosphatase: 54 IU/L (ref 48–121)
BUN/Creatinine Ratio: 18 (ref 10–24)
BUN: 20 mg/dL (ref 8–27)
Bilirubin Total: 0.2 mg/dL (ref 0.0–1.2)
CO2: 27 mmol/L (ref 20–29)
Calcium: 9 mg/dL (ref 8.6–10.2)
Chloride: 96 mmol/L (ref 96–106)
Creatinine, Ser: 1.13 mg/dL (ref 0.76–1.27)
GFR calc Af Amer: 77 mL/min/{1.73_m2} (ref >59)
GFR calc non Af Amer: 67 mL/min/{1.73_m2} (ref >59)
Globulin, Total: 2.8 g/dL (ref 1.5–4.5)
Glucose: 108 mg/dL — ABNORMAL HIGH (ref 65–99)
Potassium: 3.7 mmol/L (ref 3.5–5.2)
Sodium: 135 mmol/L (ref 134–144)
Total Protein: 7 g/dL (ref 6.0–8.5)

## 2020-09-01 NOTE — Progress Notes (Signed)
Hello Dekker,  Your lab result is normal and/or stable.Some minor variations that are not significant are commonly marked abnormal, but do not represent any medical problem for you.  Best regards, Jackson Fetters, M.D.

## 2020-09-03 ENCOUNTER — Telehealth: Payer: Self-pay | Admitting: Family Medicine

## 2020-09-03 NOTE — Telephone Encounter (Signed)
Aware of results. 

## 2020-09-10 NOTE — Progress Notes (Signed)
Patient: home Provider: office

## 2020-10-28 ENCOUNTER — Other Ambulatory Visit: Payer: Self-pay | Admitting: Family Medicine

## 2020-10-28 DIAGNOSIS — I10 Essential (primary) hypertension: Secondary | ICD-10-CM

## 2020-10-28 DIAGNOSIS — I152 Hypertension secondary to endocrine disorders: Secondary | ICD-10-CM

## 2020-10-28 DIAGNOSIS — E1159 Type 2 diabetes mellitus with other circulatory complications: Secondary | ICD-10-CM

## 2020-11-20 ENCOUNTER — Ambulatory Visit (INDEPENDENT_AMBULATORY_CARE_PROVIDER_SITE_OTHER): Payer: Medicare HMO

## 2020-11-20 ENCOUNTER — Other Ambulatory Visit: Payer: Self-pay | Admitting: Family Medicine

## 2020-11-20 ENCOUNTER — Other Ambulatory Visit: Payer: Self-pay

## 2020-11-20 DIAGNOSIS — Z23 Encounter for immunization: Secondary | ICD-10-CM

## 2020-11-20 DIAGNOSIS — I10 Essential (primary) hypertension: Secondary | ICD-10-CM

## 2020-11-20 DIAGNOSIS — I152 Hypertension secondary to endocrine disorders: Secondary | ICD-10-CM

## 2020-11-20 NOTE — Progress Notes (Signed)
Flu shot given to patient and tolerated well.  

## 2020-11-25 ENCOUNTER — Telehealth: Payer: Self-pay

## 2020-11-25 NOTE — Telephone Encounter (Signed)
  Prescription Request  11/25/2020  What is the name of the medication or equipment? albuterol sulfate solution 0.083% 2.5MG  per3 ML  Have you contacted your pharmacy to request a refill? (if applicable) no new rx he cannot afford symbicort hand held  Which pharmacy would you [facility] sent to? [facility]    Patient notified that their request is being sent to the clinical staff for review and that they should receive a response within 2 business days.

## 2020-11-25 NOTE — Telephone Encounter (Signed)
He is probably in the donut hole. If so, all inhalers will be very high. Verify this with him. If so, we may be able to provide a sample for the next 3 weeks.

## 2020-11-25 NOTE — Telephone Encounter (Signed)
Memory Dance is similar. Try that one - remember dose is just one puff a day.

## 2020-11-25 NOTE — Telephone Encounter (Signed)
He is in the donut hole.  We no longer carry samples of Symbicort.  Is there another one you recommend?

## 2020-11-25 NOTE — Telephone Encounter (Signed)
Patient's Symbicort cost has gone up to over $250 and he is unable to afford this.  He would like to know if there is another alternative that is less expensive or if he could use the Albuterol in his nebulizer instead.  If so, needs new prescription sent to [place].  Please advise.

## 2020-11-26 ENCOUNTER — Telehealth: Payer: Self-pay

## 2020-11-26 NOTE — Telephone Encounter (Signed)
What dose?

## 2020-11-26 NOTE — Telephone Encounter (Signed)
Error

## 2020-11-26 NOTE — Telephone Encounter (Signed)
No breo samples- attempted to contact patient - NVM

## 2020-11-26 NOTE — Telephone Encounter (Signed)
200 if we have it.

## 2020-12-10 ENCOUNTER — Other Ambulatory Visit: Payer: Self-pay | Admitting: Family Medicine

## 2020-12-10 DIAGNOSIS — E790 Hyperuricemia without signs of inflammatory arthritis and tophaceous disease: Secondary | ICD-10-CM

## 2020-12-31 ENCOUNTER — Telehealth: Payer: Self-pay | Admitting: *Deleted

## 2020-12-31 NOTE — Chronic Care Management (AMB) (Signed)
  Chronic Care Management   Note  12/31/2020 Name: Juan Merritt MRN: [id number] DOB: 1951-10-17  [name] is a 70 y.o. year old male who is a primary care patient of Stacks, Cletus Gash, MD. I reached out to [name] by phone today in response to a referral sent by [name] health plan.     [name] was given information about Chronic Care Management services today including:  1. CCM service includes personalized support from designated clinical staff supervised by his physician, including individualized plan of care and coordination with other care providers 2. 24/7 contact phone numbers for assistance for urgent and routine care needs. 3. Service will only be billed when office clinical staff spend 20 minutes or more in a month to coordinate care. 4. Only one practitioner may furnish and bill the service in a calendar month. 5. The patient may stop CCM services at any time (effective at the end of the month) by phone call to the office staff. 6. The patient will be responsible for cost sharing (co-pay) of up to 20% of the service fee (after annual deductible is met).  Patient agreed to services and verbal consent obtained.   Follow up plan: Telephone appointment with care management team member scheduled for:01/15/2021  [place] [facility]

## 2021-01-10 ENCOUNTER — Other Ambulatory Visit: Payer: Self-pay | Admitting: Family Medicine

## 2021-01-10 DIAGNOSIS — I10 Essential (primary) hypertension: Secondary | ICD-10-CM

## 2021-01-15 ENCOUNTER — Ambulatory Visit: Payer: Medicare HMO | Admitting: *Deleted

## 2021-01-15 DIAGNOSIS — E782 Mixed hyperlipidemia: Secondary | ICD-10-CM

## 2021-01-15 DIAGNOSIS — I152 Hypertension secondary to endocrine disorders: Secondary | ICD-10-CM

## 2021-01-15 NOTE — Chronic Care Management (AMB) (Signed)
  Chronic Care Management   Initial Visit Note  01/15/2021 Name: Juan Merritt MRN: [id number] DOB: January 11, 1951  Referred by: [name], MD Reason for referral : Chronic Care Management (Initial Visit)   Juan Merritt is a 70 y.o. year old male who is a primary care patient of Stacks, Cletus Gash, MD. The CCM team was consulted for assistance with chronic disease management and care coordination needs related to HTN, HLD and COPD  Review of patient status, including review of consultants reports, relevant laboratory and other test results was performed prior to outreach.   I spoke with Juan Merritt by telephone today regarding management of their chronic medical conditions. He does not have any CCM or resource needs and feel that his medical conditions are well managed. His wife is a Therapist, sports and she helps to manage his medical conditions. He appreciated the outreach, but does not feel that CCM services are needed at this time. He will reach out in the future if a need arises.   SDOH (Social Determinants of Health) assessments performed: Yes See Care Plan activities for detailed interventions related to SDOH     Plan:  . The patient has been provided with contact information for the care management team and has been advised to call if they would like to re-enroll in the CCM program to receive care management and care coordination services.  . CCM enrollment status changed to "previously enrolled" as per patient request on 01/15/2021  to discontinue enrollment. Case closed to case management services in primary care home.  . Patient was provided with general disease management education and encouraged to follow-up with their PCP and specialists as recommended.   Chong Sicilian, BSN, RN-BC Embedded Chronic Care Manager [facility] [place] [facility] Direct Dial: [phone]

## 2021-01-15 NOTE — Patient Instructions (Signed)
Plan:  . The patient has been provided with contact information for the care management team and has been advised to call if they would like to re-enroll in the CCM program to receive care management and care coordination services.  . CCM enrollment status changed to "previously enrolled" as per patient request on 01/15/2021  to discontinue enrollment. Case closed to case management services in primary care home.  . Patient was provided with general disease management education and encouraged to follow-up with their PCP and specialists as recommended.   Chong Sicilian, BSN, RN-BC Embedded Chronic Care Manager [facility] [place] [facility] Direct Dial: [phone]

## 2021-02-08 ENCOUNTER — Encounter: Payer: Self-pay | Admitting: Family Medicine

## 2021-02-08 ENCOUNTER — Ambulatory Visit (INDEPENDENT_AMBULATORY_CARE_PROVIDER_SITE_OTHER): Payer: Medicare HMO | Admitting: Family Medicine

## 2021-02-08 ENCOUNTER — Other Ambulatory Visit: Payer: Self-pay

## 2021-02-08 VITALS — BP 126/75 | HR 94 | Temp 97.9°F | Resp 20 | Ht 71.0 in | Wt 296.0 lb

## 2021-02-08 DIAGNOSIS — E119 Type 2 diabetes mellitus without complications: Secondary | ICD-10-CM | POA: Diagnosis not present

## 2021-02-08 DIAGNOSIS — I10 Essential (primary) hypertension: Secondary | ICD-10-CM

## 2021-02-08 DIAGNOSIS — J449 Chronic obstructive pulmonary disease, unspecified: Secondary | ICD-10-CM

## 2021-02-08 DIAGNOSIS — M1A9XX0 Chronic gout, unspecified, without tophus (tophi): Secondary | ICD-10-CM

## 2021-02-08 DIAGNOSIS — E782 Mixed hyperlipidemia: Secondary | ICD-10-CM

## 2021-02-08 DIAGNOSIS — I152 Hypertension secondary to endocrine disorders: Secondary | ICD-10-CM | POA: Diagnosis not present

## 2021-02-08 DIAGNOSIS — E1159 Type 2 diabetes mellitus with other circulatory complications: Secondary | ICD-10-CM

## 2021-02-08 LAB — BAYER DCA HB A1C WAIVED: HB A1C (BAYER DCA - WAIVED): 6.2 % (ref <7.0)

## 2021-02-08 MED ORDER — IPRATROPIUM-ALBUTEROL 0.5-2.5 (3) MG/3ML IN SOLN: 3.0000 mL | RESPIRATORY_TRACT | 3 refills | Status: DC | PRN

## 2021-02-08 MED ORDER — MOMETASONE FURO-FORMOTEROL FUM 200-5 MCG/ACT IN AERO: 2.0000 | INHALATION_SPRAY | Freq: Two times a day (BID) | RESPIRATORY_TRACT | 3 refills | Status: DC

## 2021-02-08 NOTE — Progress Notes (Signed)
Subjective:  Patient ID: Juan Merritt,  male    DOB: July 14, 1951  Age: 70 y.o.    CC: Medical Management of Chronic Issues   HPI Juan Merritt presents for  follow-up of hypertension. Patient has no history of headache chest pain or shortness of breath or recent cough. Patient also denies symptoms of TIA such as numbness weakness lateralizing. Patient denies side effects from medication. States taking it regularly.  Patient also  in for follow-up of elevated cholesterol. Doing well without complaints on current medication. Denies side effects  including myalgia and arthralgia and nausea. Also in today for liver function testing. Currently no chest pain, shortness of breath or other cardiovascular related symptoms noted.  Follow-up of diabetes. Patient does check blood sugar at home  Wife checks. Pt. Doesn't remember. She would say something if it wasn't good. Patient denies symptoms such as excessive hunger or urinary frequency, excessive hunger, nausea No significant hypoglycemic spells noted. Medications reviewed. Pt reports taking them regularly. Pt. denies complication/adverse reaction today.   Symbicort Now >$100/ month History Juan Merritt has a past medical history of Arthritis, COPD (chronic obstructive pulmonary disease) ([place]), Gallstones, Gout, Hypertension, Obesity, and Pneumonia.   Juan Merritt has a past surgical history that includes Cholecystectomy and Tonsillectomy.   His family history includes Asthma in his mother; Diabetes in his father; Esophageal cancer in his paternal uncle; Heart attack in an other family member; Heart attack (age of onset: 6) in his father; Leukemia in his brother; Lung disease in an other family member.Juan Merritt reports that Juan Merritt quit smoking about 4 years ago. His smoking use included cigarettes and e-cigarettes. Juan Merritt has a 10.00 pack-year smoking history. His smokeless tobacco use includes chew. Juan Merritt reports current alcohol use. Juan Merritt reports that Juan Merritt does not use  drugs.  Current Outpatient Medications on File Prior to Visit  Medication Sig Dispense Refill  . allopurinol (ZYLOPRIM) 100 MG tablet TAKE 1 TABLET (100 MG TOTAL) BY MOUTH DAILY. TO PREVENT GOUT 90 tablet 0  . amLODipine (NORVASC) 5 MG tablet TAKE 1 TABLET BY MOUTH EVERY DAY 90 tablet 0  . budesonide-formoterol (SYMBICORT) 160-4.5 MCG/ACT inhaler Inhale 2 puffs into the lungs in the morning and at bedtime. TAKE 2 PUFFS BY MOUTH TWICE A DAY 3 each 1  . losartan (COZAAR) 100 MG tablet TAKE 1 TABLET BY MOUTH EVERY DAY 90 tablet 0  . meloxicam (MOBIC) 15 MG tablet Take 1 tablet (15 mg total) by mouth daily. 90 tablet 1  . metFORMIN (GLUCOPHAGE-XR) 500 MG 24 hr tablet Take 1 tablet (500 mg total) by mouth daily with breakfast. 90 tablet 1  . potassium chloride SA (KLOR-CON M20) 20 MEQ tablet TAKE 1 TABLET (20 MEQ TOTAL) DAILY BY MOUTH. 90 tablet 0  . torsemide (DEMADEX) 100 MG tablet Take 1 tablet (100 mg total) by mouth 2 (two) times daily. On arising and at lunchtime 180 tablet 3  . albuterol (VENTOLIN HFA) 108 (90 Base) MCG/ACT inhaler Inhale 2 puffs into the lungs 2 (two) times daily. (Patient not taking: Reported on 02/08/2021) 54 g 3   No current facility-administered medications on file prior to visit.    ROS Review of Systems  Constitutional: Negative for fever.  Respiratory: Negative for shortness of breath.   Cardiovascular: Negative for chest pain.  Musculoskeletal: Negative for arthralgias.  Skin: Negative for rash.    Objective:  BP 126/75   Pulse 94   Temp 97.9 F (36.6 C) (Temporal)   Resp 20  Ht _0  (1.803 m)   Wt 296 lb (134.3 kg)   SpO2 94%   BMI 41.28 kg/m   BP Readings from Last 3 Encounters:  02/08/21 126/75  08/05/20 120/80  06/24/20 120/82    Wt Readings from Last 3 Encounters:  02/08/21 296 lb (134.3 kg)  08/05/20 284 lb 8 oz (129 kg)  07/30/20 294 lb (133.4 kg)     Physical Exam Vitals reviewed.  Constitutional:      Appearance: Juan Merritt is  well-developed and well-nourished.  HENT:     Head: Normocephalic and atraumatic.     Right Ear: Tympanic membrane and external ear normal. No decreased hearing noted.     Left Ear: Tympanic membrane and external ear normal. No decreased hearing noted.     Mouth/Throat:     Pharynx: No oropharyngeal exudate or posterior oropharyngeal erythema.  Eyes:     Pupils: Pupils are equal, round, and reactive to light.  Cardiovascular:     Rate and Rhythm: Normal rate and regular rhythm.     Heart sounds: No murmur heard.   Pulmonary:     Effort: No respiratory distress.     Breath sounds: Normal breath sounds.  Abdominal:     General: Bowel sounds are normal.     Palpations: Abdomen is soft. There is no mass.     Tenderness: There is no abdominal tenderness.  Musculoskeletal:     Cervical back: Normal range of motion and neck supple.     Diabetic Foot Exam - Simple   Simple Foot Form Diabetic Foot exam was performed with the following findings: Yes 02/08/2021  8:34 AM  Visual Inspection No deformities, no ulcerations, no other skin breakdown bilaterally: Yes Sensation Testing Intact to touch and monofilament testing bilaterally: Yes Pulse Check Posterior Tibialis and Dorsalis pulse intact bilaterally: Yes Comments Mild onycholysis       Assessment & Plan:   Juan Merritt was seen today for medical management of chronic issues.  Diagnoses and all orders for this visit:  Hypertension associated with diabetes ([place]) -     CBC with Differential/Platelet -     CMP14+EGFR -     Lipid panel  Mixed hyperlipidemia -     CBC with Differential/Platelet -     CMP14+EGFR -     Lipid panel  Type 2 diabetes mellitus without complication, without long-term current use of insulin (HCC) -     Bayer DCA Hb A1c Waived -     CBC with Differential/Platelet -     CMP14+EGFR -     Lipid panel  COPD mixed type (HCC)  Essential hypertension, benign  Chronic gout without tophus, unspecified  cause, unspecified site  Other orders -     mometasone-formoterol (DULERA) 200-5 MCG/ACT AERO; Inhale 2 puffs into the lungs 2 (two) times daily. -     ipratropium-albuterol (DUONEB) 0.5-2.5 (3) MG/3ML SOLN; Take 3 mLs by nebulization every 4 (four) hours as needed.   I am having Juan Merritt. Juan Merritt start on mometasone-formoterol and ipratropium-albuterol. I am also having Juan Merritt his albuterol, torsemide, amLODipine, budesonide-formoterol, meloxicam, metFORMIN, losartan, allopurinol, and potassium chloride SA.  Meds ordered this encounter  Medications  . mometasone-formoterol (DULERA) 200-5 MCG/ACT AERO    Sig: Inhale 2 puffs into the lungs 2 (two) times daily.    Dispense:  3 each    Refill:  3  . ipratropium-albuterol (DUONEB) 0.5-2.5 (3) MG/3ML SOLN    Sig: Take 3 mLs by nebulization every 4 (four)  hours as needed.    Dispense:  360 mL    Refill:  3     Follow-up: No follow-ups on file.  [name], M.D.

## 2021-02-09 LAB — LIPID PANEL
Chol/HDL Ratio: 4.2 ratio (ref 0.0–5.0)
Cholesterol, Total: 207 mg/dL — ABNORMAL HIGH (ref 100–199)
HDL: 49 mg/dL (ref >39)
LDL Chol Calc (NIH): 123 mg/dL — ABNORMAL HIGH (ref 0–99)
Triglycerides: 200 mg/dL — ABNORMAL HIGH (ref 0–149)
VLDL Cholesterol Cal: 35 mg/dL (ref 5–40)

## 2021-02-09 LAB — CBC WITH DIFFERENTIAL/PLATELET
Basophils Absolute: 0 x10E3/uL (ref 0.0–0.2)
Basos: 1 %
EOS (ABSOLUTE): 0.2 x10E3/uL (ref 0.0–0.4)
Eos: 3 %
Hematocrit: 41.9 % (ref 37.5–51.0)
Hemoglobin: 14.2 g/dL (ref 13.0–17.7)
Immature Grans (Abs): 0 x10E3/uL (ref 0.0–0.1)
Immature Granulocytes: 0 %
Lymphocytes Absolute: 3.1 x10E3/uL (ref 0.7–3.1)
Lymphs: 43 %
MCH: 32 pg (ref 26.6–33.0)
MCHC: 33.9 g/dL (ref 31.5–35.7)
MCV: 94 fL (ref 79–97)
Monocytes Absolute: 0.7 x10E3/uL (ref 0.1–0.9)
Monocytes: 10 %
Neutrophils Absolute: 3.2 x10E3/uL (ref 1.4–7.0)
Neutrophils: 43 %
Platelets: 247 x10E3/uL (ref 150–450)
RBC: 4.44 x10E6/uL (ref 4.14–5.80)
RDW: 12.1 % (ref 11.6–15.4)
WBC: 7.3 x10E3/uL (ref 3.4–10.8)

## 2021-02-09 LAB — CMP14+EGFR
ALT: 90 IU/L — ABNORMAL HIGH (ref 0–44)
AST: 71 IU/L — ABNORMAL HIGH (ref 0–40)
Albumin/Globulin Ratio: 1.3 (ref 1.2–2.2)
Albumin: 4.4 g/dL (ref 3.8–4.8)
Alkaline Phosphatase: 58 IU/L (ref 44–121)
BUN/Creatinine Ratio: 16 (ref 10–24)
BUN: 20 mg/dL (ref 8–27)
Bilirubin Total: 0.5 mg/dL (ref 0.0–1.2)
CO2: 25 mmol/L (ref 20–29)
Calcium: 9.5 mg/dL (ref 8.6–10.2)
Chloride: 97 mmol/L (ref 96–106)
Creatinine, Ser: 1.23 mg/dL (ref 0.76–1.27)
GFR calc Af Amer: 70 mL/min/{1.73_m2} (ref >59)
GFR calc non Af Amer: 60 mL/min/{1.73_m2} (ref >59)
Globulin, Total: 3.3 g/dL (ref 1.5–4.5)
Glucose: 129 mg/dL — ABNORMAL HIGH (ref 65–99)
Potassium: 3.9 mmol/L (ref 3.5–5.2)
Sodium: 140 mmol/L (ref 134–144)
Total Protein: 7.7 g/dL (ref 6.0–8.5)

## 2021-02-11 NOTE — Progress Notes (Signed)
Hello Husayn,  Your lab result is normal and/or stable.Some minor variations that are not significant are commonly marked abnormal, but do not represent any medical problem for you.  Best regards, Neve Branscomb, M.D.

## 2021-02-12 ENCOUNTER — Other Ambulatory Visit: Payer: Self-pay | Admitting: Family Medicine

## 2021-03-07 ENCOUNTER — Other Ambulatory Visit: Payer: Self-pay | Admitting: Family Medicine

## 2021-03-07 DIAGNOSIS — E790 Hyperuricemia without signs of inflammatory arthritis and tophaceous disease: Secondary | ICD-10-CM

## 2021-03-29 ENCOUNTER — Other Ambulatory Visit: Payer: Self-pay | Admitting: Family Medicine

## 2021-03-29 DIAGNOSIS — I152 Hypertension secondary to endocrine disorders: Secondary | ICD-10-CM

## 2021-03-29 DIAGNOSIS — I10 Essential (primary) hypertension: Secondary | ICD-10-CM

## 2021-03-31 ENCOUNTER — Other Ambulatory Visit: Payer: Self-pay | Admitting: Family Medicine

## 2021-03-31 MED ORDER — MELOXICAM 15 MG PO TABS: 15.0000 mg | ORAL_TABLET | Freq: Every day | ORAL | 1 refills | Status: DC

## 2021-03-31 NOTE — Telephone Encounter (Signed)
  Prescription Request  03/31/2021  What is the name of the medication or equipment? meloxicam  Have you contacted your pharmacy to request a refill? (if applicable) yes  Which pharmacy would you like this sent to? [facility] in [place]   Patient notified that their request is being sent to the clinical staff for review and that they should receive a response within 2 business days.

## 2021-04-07 ENCOUNTER — Other Ambulatory Visit: Payer: Self-pay | Admitting: Family Medicine

## 2021-04-07 DIAGNOSIS — I10 Essential (primary) hypertension: Secondary | ICD-10-CM

## 2021-04-15 ENCOUNTER — Other Ambulatory Visit: Payer: Self-pay | Admitting: Family Medicine

## 2021-04-15 DIAGNOSIS — J449 Chronic obstructive pulmonary disease, unspecified: Secondary | ICD-10-CM

## 2021-04-29 ENCOUNTER — Other Ambulatory Visit: Payer: Self-pay | Admitting: Family Medicine

## 2021-04-29 DIAGNOSIS — I10 Essential (primary) hypertension: Secondary | ICD-10-CM

## 2021-05-11 ENCOUNTER — Other Ambulatory Visit: Payer: Self-pay | Admitting: Family Medicine

## 2021-05-12 ENCOUNTER — Encounter: Payer: Self-pay | Admitting: Family Medicine

## 2021-05-12 ENCOUNTER — Ambulatory Visit (INDEPENDENT_AMBULATORY_CARE_PROVIDER_SITE_OTHER): Payer: Medicare HMO

## 2021-05-12 ENCOUNTER — Ambulatory Visit (INDEPENDENT_AMBULATORY_CARE_PROVIDER_SITE_OTHER): Payer: Medicare HMO | Admitting: Family Medicine

## 2021-05-12 ENCOUNTER — Other Ambulatory Visit: Payer: Self-pay

## 2021-05-12 VITALS — BP 124/71 | HR 98 | Temp 97.6°F | Ht 71.0 in | Wt 302.2 lb

## 2021-05-12 DIAGNOSIS — R0600 Dyspnea, unspecified: Secondary | ICD-10-CM | POA: Diagnosis not present

## 2021-05-12 DIAGNOSIS — F17210 Nicotine dependence, cigarettes, uncomplicated: Secondary | ICD-10-CM | POA: Diagnosis not present

## 2021-05-12 DIAGNOSIS — M791 Myalgia, unspecified site: Secondary | ICD-10-CM | POA: Diagnosis not present

## 2021-05-12 DIAGNOSIS — I152 Hypertension secondary to endocrine disorders: Secondary | ICD-10-CM

## 2021-05-12 DIAGNOSIS — J449 Chronic obstructive pulmonary disease, unspecified: Secondary | ICD-10-CM

## 2021-05-12 DIAGNOSIS — E119 Type 2 diabetes mellitus without complications: Secondary | ICD-10-CM | POA: Diagnosis not present

## 2021-05-12 DIAGNOSIS — R5381 Other malaise: Secondary | ICD-10-CM

## 2021-05-12 DIAGNOSIS — E782 Mixed hyperlipidemia: Secondary | ICD-10-CM

## 2021-05-12 DIAGNOSIS — E1159 Type 2 diabetes mellitus with other circulatory complications: Secondary | ICD-10-CM

## 2021-05-12 DIAGNOSIS — R69 Illness, unspecified: Secondary | ICD-10-CM | POA: Diagnosis not present

## 2021-05-12 DIAGNOSIS — J439 Emphysema, unspecified: Secondary | ICD-10-CM | POA: Diagnosis not present

## 2021-05-12 LAB — URINALYSIS
Bilirubin, UA: NEGATIVE
Ketones, UA: NEGATIVE
Nitrite, UA: NEGATIVE
Protein,UA: NEGATIVE
Specific Gravity, UA: 1.01 (ref 1.005–1.030)
Urobilinogen, Ur: 0.2 mg/dL (ref 0.2–1.0)
pH, UA: 6.5 (ref 5.0–7.5)

## 2021-05-12 LAB — BAYER DCA HB A1C WAIVED: HB A1C (BAYER DCA - WAIVED): 12.5 % — ABNORMAL HIGH (ref <7.0)

## 2021-05-12 MED ORDER — METFORMIN HCL ER 750 MG PO TB24: 1500.0000 mg | ORAL_TABLET | Freq: Every day | ORAL | 5 refills | Status: DC

## 2021-05-12 NOTE — Progress Notes (Signed)
Subjective:  Patient ID: Juan Merritt,  male    DOB: [birth year]  Age: 70 y.o.    CC: Medical Management of Chronic Issues   HPI Srihari Shellhammer Mchargue presents for  follow-up of hypertension. Patient has no history of headache chest pain or shortness of breath or recent cough. Patient also denies symptoms of TIA such as numbness weakness lateralizing. Patient denies side effects from medication. States taking it regularly.  Patient also  in for follow-up of elevated cholesterol. Doing well without complaints on current medication. Denies side effects  including myalgia and arthralgia and nausea. Also in today for liver function testing. Currently no chest pain, shortness of breath or other cardiovascular related symptoms noted.  Follow-up of diabetes. Patient does check blood sugar at home.  His wife checks it for him and he does not know what the numbers are. Patient denies symptoms such as excessive hunger or urinary frequency, excessive hunger, nausea No significant hypoglycemic spells noted. Medications reviewed. Pt reports taking them regularly. Pt. denies complication/adverse reaction today.   Pt. Now on symbicort, but he is only taking it periodically.  Patient denies being short of breath.  However he says he feels so bad that he rarely leaves the house.  Additionally he says his arms and legs hurt.  This is not in the joints as much as it is in the muscles.   History Delmo has a past medical history of Arthritis, COPD (chronic obstructive pulmonary disease) ([place]), Gallstones, Gout, Hypertension, Obesity, and Pneumonia.   He has a past surgical history that includes Cholecystectomy and Tonsillectomy.   His family history includes Asthma in his mother; Diabetes in his father; Esophageal cancer in his paternal uncle; Heart attack in an other family member; Heart attack (age of onset: 74) in his father; Leukemia in his brother; Lung disease in an other family member.He reports that  he quit smoking about 4 years ago. His smoking use included cigarettes and e-cigarettes. He has a 10.00 pack-year smoking history. His smokeless tobacco use includes chew. He reports current alcohol use. He reports that he does not use drugs.  Current Outpatient Medications on File Prior to Visit  Medication Sig Dispense Refill  . albuterol (VENTOLIN HFA) 108 (90 Base) MCG/ACT inhaler Inhale 2 puffs into the lungs 2 (two) times daily. 54 g 3  . allopurinol (ZYLOPRIM) 100 MG tablet TAKE 1 TABLET (100 MG TOTAL) BY MOUTH DAILY. TO PREVENT GOUT 90 tablet 0  . amLODipine (NORVASC) 5 MG tablet TAKE 1 TABLET BY MOUTH EVERY DAY 90 tablet 0  . ipratropium-albuterol (DUONEB) 0.5-2.5 (3) MG/3ML SOLN Take 3 mLs by nebulization every 4 (four) hours as needed. 360 mL 3  . losartan (COZAAR) 100 MG tablet TAKE 1 TABLET BY MOUTH EVERY DAY 90 tablet 0  . meloxicam (MOBIC) 15 MG tablet Take 1 tablet (15 mg total) by mouth daily. 90 tablet 1  . potassium chloride SA (KLOR-CON M20) 20 MEQ tablet TAKE 1 TABLET (20 MEQ TOTAL) DAILY BY MOUTH. 90 tablet 0  . SYMBICORT 160-4.5 MCG/ACT inhaler INHALE 2 PUFFS INTO THE LUNGS IN THE MORNING AND AT BEDTIME. TAKE 2 PUFFS BY MOUTH TWICE A DAY 30.6 each 0  . torsemide (DEMADEX) 100 MG tablet Take 1 tablet (100 mg total) by mouth 2 (two) times daily. On arising and at lunchtime 180 tablet 3   No current facility-administered medications on file prior to visit.    ROS Review of Systems  Constitutional: Negative for fever.  Respiratory: Negative for shortness of breath.   Cardiovascular: Negative for chest pain.  Musculoskeletal: Negative for arthralgias.  Skin: Negative for rash.    Objective:  BP 124/71   Pulse 98   Temp 97.6 F (36.4 C)   Ht '5\' 11"'  (1.803 m)   Wt (!) 302 lb 3.2 oz (137.1 kg)   SpO2 92%   BMI 42.15 kg/m   BP Readings from Last 3 Encounters:  05/12/21 124/71  02/08/21 126/75  08/05/20 120/80    Wt Readings from Last 3 Encounters:  05/12/21  (!) 302 lb 3.2 oz (137.1 kg)  02/08/21 296 lb (134.3 kg)  08/05/20 284 lb 8 oz (129 kg)     Physical Exam Vitals reviewed.  Constitutional:      Appearance: He is well-developed.  HENT:     Head: Normocephalic and atraumatic.     Right Ear: External ear normal.     Left Ear: External ear normal.     Mouth/Throat:     Pharynx: No oropharyngeal exudate or posterior oropharyngeal erythema.  Eyes:     Pupils: Pupils are equal, round, and reactive to light.  Cardiovascular:     Rate and Rhythm: Normal rate and regular rhythm.     Heart sounds: No murmur heard.   Pulmonary:     Effort: No respiratory distress.     Breath sounds: Normal breath sounds.  Musculoskeletal:     Cervical back: Normal range of motion and neck supple.  Neurological:     Mental Status: He is alert and oriented to person, place, and time.       Assessment & Plan:   Tylen was seen today for medical management of chronic issues.  Diagnoses and all orders for this visit:  Type 2 diabetes mellitus without complication, without long-term current use of insulin (HCC) -     Bayer DCA Hb A1c Waived -     CMP14+EGFR -     CBC with Differential/Platelet -     Bayer DCA Hb A1c Waived  Mixed hyperlipidemia -     Lipid panel  Hypertension associated with diabetes ([place])  COPD mixed type ([place]) -     DG Chest 2 View; Future  Myalgia -     High sensitivity CRP -     Arthritis Panel  [place] -     Urinalysis -     Urine Culture -     High sensitivity CRP -     Arthritis Panel  Other orders -     metFORMIN (GLUCOPHAGE-XR) 750 MG 24 hr tablet; Take 2 tablets (1,500 mg total) by mouth daily with breakfast.   I have discontinued [place]. Horan's mometasone-formoterol and metFORMIN. I am also having him start on metFORMIN. Additionally, I am having him maintain his albuterol, torsemide, ipratropium-albuterol, allopurinol, losartan, meloxicam, potassium chloride SA, Symbicort, and amLODipine.  Meds  ordered this encounter  Medications  . metFORMIN (GLUCOPHAGE-XR) 750 MG 24 hr tablet    Sig: Take 2 tablets (1,500 mg total) by mouth daily with breakfast.    Dispense:  60 tablet    Refill:  5     Follow-up: Return in about 1 month (around 06/12/2021).  [name], M.D.

## 2021-05-13 ENCOUNTER — Other Ambulatory Visit: Payer: Self-pay

## 2021-05-13 ENCOUNTER — Emergency Department (HOSPITAL_COMMUNITY): Payer: Medicare HMO

## 2021-05-13 ENCOUNTER — Telehealth: Payer: Self-pay | Admitting: Family Medicine

## 2021-05-13 ENCOUNTER — Emergency Department (HOSPITAL_COMMUNITY)
Admission: EM | Admit: 2021-05-13 | Discharge: 2021-05-13 | Disposition: A | Discharge status: Home / Self Care | Payer: Medicare HMO | Attending: Emergency Medicine | Admitting: Emergency Medicine

## 2021-05-13 ENCOUNTER — Encounter (HOSPITAL_COMMUNITY): Payer: Self-pay | Admitting: Emergency Medicine

## 2021-05-13 DIAGNOSIS — E1169 Type 2 diabetes mellitus with other specified complication: Secondary | ICD-10-CM | POA: Insufficient documentation

## 2021-05-13 DIAGNOSIS — Z7982 Long term (current) use of aspirin: Secondary | ICD-10-CM | POA: Diagnosis not present

## 2021-05-13 DIAGNOSIS — K760 Fatty (change of) liver, not elsewhere classified: Secondary | ICD-10-CM | POA: Diagnosis not present

## 2021-05-13 DIAGNOSIS — R739 Hyperglycemia, unspecified: Secondary | ICD-10-CM

## 2021-05-13 DIAGNOSIS — Z7984 Long term (current) use of oral hypoglycemic drugs: Secondary | ICD-10-CM | POA: Diagnosis not present

## 2021-05-13 DIAGNOSIS — E782 Mixed hyperlipidemia: Secondary | ICD-10-CM | POA: Insufficient documentation

## 2021-05-13 DIAGNOSIS — J449 Chronic obstructive pulmonary disease, unspecified: Secondary | ICD-10-CM | POA: Insufficient documentation

## 2021-05-13 DIAGNOSIS — Z7951 Long term (current) use of inhaled steroids: Secondary | ICD-10-CM | POA: Diagnosis not present

## 2021-05-13 DIAGNOSIS — I10 Essential (primary) hypertension: Secondary | ICD-10-CM | POA: Insufficient documentation

## 2021-05-13 DIAGNOSIS — E1165 Type 2 diabetes mellitus with hyperglycemia: Secondary | ICD-10-CM | POA: Insufficient documentation

## 2021-05-13 DIAGNOSIS — Z794 Long term (current) use of insulin: Secondary | ICD-10-CM | POA: Diagnosis not present

## 2021-05-13 DIAGNOSIS — Z87891 Personal history of nicotine dependence: Secondary | ICD-10-CM | POA: Diagnosis not present

## 2021-05-13 DIAGNOSIS — R945 Abnormal results of liver function studies: Secondary | ICD-10-CM | POA: Insufficient documentation

## 2021-05-13 DIAGNOSIS — Z79899 Other long term (current) drug therapy: Secondary | ICD-10-CM | POA: Diagnosis not present

## 2021-05-13 DIAGNOSIS — K5750 Diverticulosis of both small and large intestine without perforation or abscess without bleeding: Secondary | ICD-10-CM | POA: Diagnosis not present

## 2021-05-13 DIAGNOSIS — M47817 Spondylosis without myelopathy or radiculopathy, lumbosacral region: Secondary | ICD-10-CM | POA: Diagnosis not present

## 2021-05-13 DIAGNOSIS — I700 Atherosclerosis of aorta: Secondary | ICD-10-CM | POA: Diagnosis not present

## 2021-05-13 HISTORY — DX: Type 2 diabetes mellitus without complications: E11.9

## 2021-05-13 LAB — CBC WITH DIFFERENTIAL/PLATELET
Abs Immature Granulocytes: 0.02 10*3/uL (ref 0.00–0.07)
Basophils Absolute: 0.1 10*3/uL (ref 0.0–0.2)
Basophils Absolute: 0.1 10*3/uL (ref 0.0–0.1)
Basophils Relative: 1 %
Basos: 1 %
EOS (ABSOLUTE): 0.5 10*3/uL — ABNORMAL HIGH (ref 0.0–0.4)
Eos: 6 %
Eosinophils Absolute: 0.4 10*3/uL (ref 0.0–0.5)
Eosinophils Relative: 4 %
HCT: 42.1 % (ref 39.0–52.0)
Hematocrit: 42.9 % (ref 37.5–51.0)
Hemoglobin: 14.2 g/dL (ref 13.0–17.7)
Hemoglobin: 14.7 g/dL (ref 13.0–17.0)
Immature Grans (Abs): 0 10*3/uL (ref 0.0–0.1)
Immature Granulocytes: 0 %
Immature Granulocytes: 0 %
Lymphocytes Absolute: 2.9 10*3/uL (ref 0.7–3.1)
Lymphocytes Relative: 33 %
Lymphs Abs: 2.7 10*3/uL (ref 0.7–4.0)
Lymphs: 40 %
MCH: 31.3 pg (ref 26.6–33.0)
MCH: 32.5 pg (ref 26.0–34.0)
MCHC: 33.1 g/dL (ref 31.5–35.7)
MCHC: 34.9 g/dL (ref 30.0–36.0)
MCV: 95 fL (ref 79–97)
MCV: 92.9 fL (ref 80.0–100.0)
Monocytes Absolute: 0.6 10*3/uL (ref 0.1–0.9)
Monocytes Absolute: 0.6 10*3/uL (ref 0.1–1.0)
Monocytes Relative: 8 %
Monocytes: 8 %
Neutro Abs: 4.5 10*3/uL (ref 1.7–7.7)
Neutrophils Absolute: 3.2 10*3/uL (ref 1.4–7.0)
Neutrophils Relative %: 54 %
Neutrophils: 45 %
Platelets: 226 10*3/uL (ref 150–450)
Platelets: 253 10*3/uL (ref 150–400)
RBC: 4.54 x10E6/uL (ref 4.14–5.80)
RBC: 4.53 MIL/uL (ref 4.22–5.81)
RDW: 11.8 % (ref 11.6–15.4)
RDW: 11.8 % (ref 11.5–15.5)
WBC: 7.3 10*3/uL (ref 3.4–10.8)
WBC: 8.3 10*3/uL (ref 4.0–10.5)
nRBC: 0 % (ref 0.0–0.2)

## 2021-05-13 LAB — CMP14+EGFR
ALT: 97 IU/L — ABNORMAL HIGH (ref 0–44)
AST: 86 IU/L — ABNORMAL HIGH (ref 0–40)
Albumin/Globulin Ratio: 1.4 (ref 1.2–2.2)
Albumin: 4.2 g/dL (ref 3.8–4.8)
Alkaline Phosphatase: 80 IU/L (ref 44–121)
BUN/Creatinine Ratio: 16 (ref 10–24)
BUN: 19 mg/dL (ref 8–27)
Bilirubin Total: 0.5 mg/dL (ref 0.0–1.2)
CO2: 28 mmol/L (ref 20–29)
Calcium: 9.3 mg/dL (ref 8.6–10.2)
Chloride: 90 mmol/L — ABNORMAL LOW (ref 96–106)
Creatinine, Ser: 1.2 mg/dL (ref 0.76–1.27)
Globulin, Total: 3.1 g/dL (ref 1.5–4.5)
Glucose: 445 mg/dL (ref 65–99)
Potassium: 3.7 mmol/L (ref 3.5–5.2)
Sodium: 132 mmol/L — ABNORMAL LOW (ref 134–144)
Total Protein: 7.3 g/dL (ref 6.0–8.5)
eGFR: 66 mL/min/{1.73_m2} (ref >59)

## 2021-05-13 LAB — ARTHRITIS PANEL
Basophils Absolute: 0.1 10*3/uL (ref 0.0–0.2)
Basos: 1 %
EOS (ABSOLUTE): 0.5 10*3/uL — ABNORMAL HIGH (ref 0.0–0.4)
Eos: 7 %
Hematocrit: 42 % (ref 37.5–51.0)
Hemoglobin: 14.2 g/dL (ref 13.0–17.7)
Immature Grans (Abs): 0 10*3/uL (ref 0.0–0.1)
Immature Granulocytes: 0 %
Lymphocytes Absolute: 2.3 10*3/uL (ref 0.7–3.1)
Lymphs: 34 %
MCH: 31.7 pg (ref 26.6–33.0)
MCHC: 33.8 g/dL (ref 31.5–35.7)
MCV: 94 fL (ref 79–97)
Monocytes Absolute: 0.5 10*3/uL (ref 0.1–0.9)
Monocytes: 8 %
Neutrophils Absolute: 3.4 10*3/uL (ref 1.4–7.0)
Neutrophils: 50 %
Platelets: 232 10*3/uL (ref 150–450)
RBC: 4.48 x10E6/uL (ref 4.14–5.80)
RDW: 11.6 % (ref 11.6–15.4)
Rheumatoid fact SerPl-aCnc: <10 IU/mL (ref <14.0)
Sed Rate: 50 mm/hr — ABNORMAL HIGH (ref 0–30)
Uric Acid: 6.7 mg/dL (ref 3.8–8.4)
WBC: 6.7 10*3/uL (ref 3.4–10.8)

## 2021-05-13 LAB — BLOOD GAS, VENOUS
Acid-Base Excess: 5.8 mmol/L — ABNORMAL HIGH (ref 0.0–2.0)
Bicarbonate: 27.8 mmol/L (ref 20.0–28.0)
FIO2: 21
O2 Saturation: 55.4 %
Patient temperature: 36.6
pCO2, Ven: 49.1 mmHg (ref 44.0–60.0)
pH, Ven: 7.407 (ref 7.250–7.430)
pO2, Ven: 31.7 mmHg — CL (ref 32.0–45.0)

## 2021-05-13 LAB — BASIC METABOLIC PANEL
Anion gap: 9 (ref 5–15)
BUN: 22 mg/dL (ref 8–23)
CO2: 30 mmol/L (ref 22–32)
Calcium: 8.9 mg/dL (ref 8.9–10.3)
Chloride: 90 mmol/L — ABNORMAL LOW (ref 98–111)
Creatinine, Ser: 1.32 mg/dL — ABNORMAL HIGH (ref 0.61–1.24)
GFR, Estimated: 59 mL/min — ABNORMAL LOW (ref >60)
Glucose, Bld: 443 mg/dL — ABNORMAL HIGH (ref 70–99)
Potassium: 3.5 mmol/L (ref 3.5–5.1)
Sodium: 129 mmol/L — ABNORMAL LOW (ref 135–145)

## 2021-05-13 LAB — URINALYSIS, ROUTINE W REFLEX MICROSCOPIC
Bilirubin Urine: NEGATIVE
Glucose, UA: >=500 mg/dL — AB
Ketones, ur: NEGATIVE mg/dL
Nitrite: NEGATIVE
Protein, ur: NEGATIVE mg/dL
Specific Gravity, Urine: 1.014 (ref 1.005–1.030)
pH: 6 (ref 5.0–8.0)

## 2021-05-13 LAB — LIPID PANEL
Chol/HDL Ratio: 6.7 ratio — ABNORMAL HIGH (ref 0.0–5.0)
Cholesterol, Total: 167 mg/dL (ref 100–199)
HDL: 25 mg/dL — ABNORMAL LOW (ref >39)
LDL Chol Calc (NIH): 65 mg/dL (ref 0–99)
Triglycerides: 502 mg/dL — ABNORMAL HIGH (ref 0–149)
VLDL Cholesterol Cal: 77 mg/dL — ABNORMAL HIGH (ref 5–40)

## 2021-05-13 LAB — CBG MONITORING, ED
Glucose-Capillary: 466 mg/dL — ABNORMAL HIGH (ref 70–99)
Glucose-Capillary: 388 mg/dL — ABNORMAL HIGH (ref 70–99)
Glucose-Capillary: 301 mg/dL — ABNORMAL HIGH (ref 70–99)

## 2021-05-13 LAB — ETHANOL: Alcohol, Ethyl (B): <10 mg/dL (ref <10)

## 2021-05-13 LAB — HEPATIC FUNCTION PANEL
ALT: 113 U/L — ABNORMAL HIGH (ref 0–44)
AST: 116 U/L — ABNORMAL HIGH (ref 15–41)
Albumin: 4.1 g/dL (ref 3.5–5.0)
Alkaline Phosphatase: 69 U/L (ref 38–126)
Bilirubin, Direct: 0.1 mg/dL (ref 0.0–0.2)
Indirect Bilirubin: 1 mg/dL — ABNORMAL HIGH (ref 0.3–0.9)
Total Bilirubin: 1.1 mg/dL (ref 0.3–1.2)
Total Protein: 8.2 g/dL — ABNORMAL HIGH (ref 6.5–8.1)

## 2021-05-13 LAB — HIGH SENSITIVITY CRP: CRP, High Sensitivity: 33.39 mg/L — ABNORMAL HIGH (ref 0.00–3.00)

## 2021-05-13 LAB — BETA-HYDROXYBUTYRIC ACID: Beta-Hydroxybutyric Acid: 0.53 mmol/L — ABNORMAL HIGH (ref 0.05–0.27)

## 2021-05-13 LAB — LIPASE, BLOOD: Lipase: 41 U/L (ref 11–51)

## 2021-05-13 MED ORDER — INSULIN GLARGINE 100 UNIT/ML SOLOSTAR PEN: 20.0000 [IU] | PEN_INJECTOR | Freq: Every day | SUBCUTANEOUS | 3 refills | Status: DC

## 2021-05-13 MED ORDER — DEXTROSE 50 % IV SOLN: 0.0000 mL | INTRAVENOUS | Status: DC | PRN

## 2021-05-13 MED ORDER — LIVING WELL WITH DIABETES BOOK
Freq: Once | Status: AC
  Filled 2021-05-13: qty 1

## 2021-05-13 MED ORDER — LACTATED RINGERS IV SOLN: INTRAVENOUS | Status: DC

## 2021-05-13 MED ORDER — DEXTROSE IN LACTATED RINGERS 5 % IV SOLN: INTRAVENOUS | Status: DC

## 2021-05-13 MED ORDER — INSULIN ASPART 100 UNIT/ML IJ SOLN
10.0000 [IU] | Freq: Once | INTRAMUSCULAR | Status: AC
  Administered 2021-05-13: 10 [IU] via INTRAVENOUS
  Filled 2021-05-13: qty 1

## 2021-05-13 MED ORDER — INSULIN GLARGINE 100 UNIT/ML ~~LOC~~ SOLN
20.0000 [IU] | Freq: Once | SUBCUTANEOUS | Status: AC
  Administered 2021-05-13: 20 [IU] via SUBCUTANEOUS
  Filled 2021-05-13: qty 0.2

## 2021-05-13 MED ORDER — LACTATED RINGERS IV BOLUS
500.0000 mL | Freq: Once | INTRAVENOUS | Status: AC
  Administered 2021-05-13: 500 mL via INTRAVENOUS

## 2021-05-13 MED ORDER — IOHEXOL 300 MG/ML  SOLN
100.0000 mL | Freq: Once | INTRAMUSCULAR | Status: AC | PRN
  Administered 2021-05-13: 100 mL via INTRAVENOUS

## 2021-05-13 MED ORDER — INSULIN GLARGINE 100 UNIT/ML ~~LOC~~ SOLN: 20.0000 [IU] | Freq: Every day | SUBCUTANEOUS | 11 refills | Status: DC

## 2021-05-13 MED ORDER — INSULIN STARTER KIT- PEN NEEDLES (ENGLISH)
1.0000 | Freq: Once | Status: AC
  Administered 2021-05-13: 1
  Filled 2021-05-13: qty 1

## 2021-05-13 NOTE — Discharge Instructions (Addendum)
Administer the insulin nightly, as prescribed.  At this point, this may be the only thing that will lower your blood sugar.  Be sure to measure your blood sugar prior to insulin administration, each morning, and after meals.  Keep a log of these readings and share them with your doctor.  Follow-up with your primary care provider for any further management of this issue.

## 2021-05-13 NOTE — Progress Notes (Signed)
Inpatient Diabetes Program Recommendations  AACE/ADA: New Consensus Statement on Inpatient Glycemic Control (2015)  Target Ranges:  Prepandial:   less than 140 mg/dL      Peak postprandial:   less than 180 mg/dL (1-2 hours)      Critically ill patients:  140 - 180 mg/dL   Lab Results  Component Value Date   GLUCAP 388 (H) 05/13/2021   HGBA1C 12.5 (H) 05/12/2021    Review of Glycemic Control  Diabetes history: DM2 Outpatient Diabetes medications: metformin 1500 mg with breakfast Current orders for Inpatient glycemic control: Novolog 10 units 1x dose at 1445  HgbA1C - 12.5% (was 6.2% on 02/08/21) Weight appears stable from 3 months ago 466, 443, 388 mg/dL (Not in DKA)  Consult received for New insulin prescription Ordered Living Well with Diabetes book Ordered insulin pen starter kit Awaiting callback from [place], Therapist, sports.  Inpatient Diabetes Program Recommendations:     Lantus 20 units QD or Novolin ReliOn 70/30 Flexpen 14 units BID   Will need blood glucose meter kit (# [id number]) Insulin pen needles (# [id number]) Lantus Solostar pen (# [phone])  RN to teach insulin pen administration. Included written instructions per references/attachments. Will need to f/u with PCP and take blood sugar log for review Check blood sugars 3x daily  OP Diabetes Education consult for uncontrolled DM and new to insulin  Thank you. Lorenda Peck, RD, LDN, CDE Inpatient Diabetes Coordinator [phone]

## 2021-05-13 NOTE — ED Notes (Signed)
Date and time results received: 05/13/21 & 1536  Test:  Blood gas Venous ( PO2 V)  Critical Value: 31.7  Name of Provider Notified: Arlean Hopping PA  Orders Received? Or Actions Taken?: Notified

## 2021-05-13 NOTE — ED Provider Notes (Signed)
[facility] EMERGENCY DEPARTMENT Provider Note   CSN: [id number] Arrival date & time: 05/13/21  1328     History Chief Complaint  Patient presents with  . Hyperglycemia    Juan Merritt is a 70 y.o. male.  HPI      Juan Merritt is a 70 y.o. male, with a history of DM type II, COPD, HTN, obesity, presenting to the ED with concern for hyperglycemia.  Patient was seen by his PCP yesterday for a regular checkup.  Labs were drawn at that time.  Results today indicated hyperglycemia with glucose greater than 400. Patient states he has been feeling overall unwell for about the last month.  He checks his blood sugars, but since his wife checks them he does not know what the values are. He states his PCP instructed him to take 2 of his metformin pills daily in the morning instead of 1.  He states he feels better today than he did yesterday.  Denies fever/chills, recent illness, dizziness, chest pain, shortness of breath, abdominal pain, urinary symptoms, neurologic deficits, excessive thirst, syncope, N/V/D, or any other complaints.   Past Medical History:  Diagnosis Date  . Arthritis   . COPD (chronic obstructive pulmonary disease) (HCC)    not on home o2  . Diabetes mellitus without complication ([place])   . Gallstones   . Gout   . Hypertension   . Obesity   . Pneumonia     Patient Active Problem List   Diagnosis Date Noted  . Educated about COVID-19 virus infection 06/23/2020  . Nonspecific abnormal electrocardiogram (ECG) (EKG) 06/23/2020  . Hypertension associated with diabetes ([place]) 05/05/2020  . Mixed hyperlipidemia 05/05/2020  . Hyperuricemia 05/05/2020  . Morbid obesity ([place]) 09/23/2019  . Gout, chronic 07/01/2016  . Incomplete rotator cuff tear 09/07/2015  . Essential hypertension, benign 09/07/2015  . COPD mixed type ([place]) 03/29/2015  . Cigarette smoker 03/24/2015    Past Surgical History:  Procedure Laterality Date  . CHOLECYSTECTOMY    .  TONSILLECTOMY         Family History  Problem Relation Age of Onset  . Asthma Mother   . Diabetes Father   . Heart attack Father 57  . Esophageal cancer Paternal Uncle   . Lung disease Other        pat cousin  . Leukemia Brother   . Heart attack Other        pat cousin  . Colon cancer Neg Hx     Social History   Tobacco Use  . Smoking status: Former Smoker    Packs/day: 0.25    Years: 40.00    Pack years: 10.00    Types: Cigarettes, E-cigarettes    Quit date: 01/15/2017    Years since quitting: 4.3  . Smokeless tobacco: Current User    Types: Chew  Vaping Use  . Vaping Use: Some days  Substance Use Topics  . Alcohol use: Yes    Comment: 1-2 beers occasionally   . Drug use: No    Home Medications Prior to Admission medications   Medication Sig Start Date End Date Taking? Authorizing Provider  albuterol (VENTOLIN HFA) 108 (90 Base) MCG/ACT inhaler Inhale 2 puffs into the lungs 2 (two) times daily. 05/05/20  Yes Stacks, Cletus Gash, MD  allopurinol (ZYLOPRIM) 100 MG tablet TAKE 1 TABLET (100 MG TOTAL) BY MOUTH DAILY. TO PREVENT GOUT Patient taking differently: Take 100 mg by mouth daily. 03/08/21  Yes [name], MD  amLODipine ([place])  5 MG tablet TAKE 1 TABLET BY MOUTH EVERY DAY Patient taking differently: Take 5 mg by mouth daily. 04/29/21  Yes [name], MD  aspirin EC 81 MG tablet Take 81 mg by mouth daily. Swallow whole.   Yes [provider]  insulin glargine (LANTUS) 100 UNIT/ML Solostar Pen Inject 20 Units into the skin daily. 05/13/21  Yes Inman Fettig C, PA-C  ipratropium-albuterol (DUONEB) 0.5-2.5 (3) MG/3ML SOLN Take 3 mLs by nebulization every 4 (four) hours as needed. Patient taking differently: Take 3 mLs by nebulization every 4 (four) hours as needed (SOB). 02/08/21  Yes Stacks, Cletus Gash, MD  losartan (COZAAR) 100 MG tablet TAKE 1 TABLET BY MOUTH EVERY DAY Patient taking differently: Take 100 mg by mouth daily. 03/29/21  Yes Stacks, Cletus Gash, MD   meloxicam (MOBIC) 15 MG tablet Take 1 tablet (15 mg total) by mouth daily. 03/31/21  Yes [name], MD  metFORMIN (GLUCOPHAGE-XR) 750 MG 24 hr tablet Take 2 tablets (1,500 mg total) by mouth daily with breakfast. Patient taking differently: Take 1,000 mg by mouth daily with breakfast. 05/12/21  Yes [name], MD  naproxen sodium (ALEVE) 220 MG tablet Take 220 mg by mouth daily as needed (pain).   Yes [provider]  potassium chloride SA (KLOR-CON M20) 20 MEQ tablet TAKE 1 TABLET (20 MEQ TOTAL) DAILY BY MOUTH. Patient taking differently: Take 20 mEq by mouth daily. 04/07/21  Yes Stacks, Cletus Gash, MD  SYMBICORT 160-4.5 MCG/ACT inhaler INHALE 2 PUFFS INTO THE LUNGS IN THE MORNING AND AT BEDTIME. TAKE 2 PUFFS BY MOUTH TWICE A DAY Patient taking differently: Inhale 2 puffs into the lungs in the morning and at bedtime. 04/15/21  Yes [name], MD  torsemide (DEMADEX) 100 MG tablet Take 1 tablet (100 mg total) by mouth 2 (two) times daily. On arising and at lunchtime 05/05/20  Yes [name], MD    Allergies    Patient has no known allergies.  Review of Systems   Review of Systems  Constitutional: Negative for chills, diaphoresis and fever.  Eyes: Negative for visual disturbance.  Respiratory: Negative for cough and shortness of breath.   Cardiovascular: Negative for chest pain.  Gastrointestinal: Negative for abdominal pain, blood in stool, diarrhea, nausea and vomiting.  Endocrine: Positive for polyuria.  Genitourinary: Negative for dysuria.  Neurological: Negative for dizziness, syncope, weakness and numbness.  All other systems reviewed and are negative.   Physical Exam Updated Vital Signs BP 114/79   Pulse 96   Temp 97.9 F (36.6 C) (Oral)   Resp 19   Ht '5\' 11"'  (1.803 m)   Wt (!) 137.1 kg   SpO2 96%   BMI 42.15 kg/m   Physical Exam Vitals and nursing note reviewed.  Constitutional:      General: He is not in acute distress.    Appearance: He is  well-developed. He is obese. He is not diaphoretic.  HENT:     Head: Normocephalic and atraumatic.     Mouth/Throat:     Mouth: Mucous membranes are moist.     Pharynx: Oropharynx is clear.  Eyes:     Conjunctiva/sclera: Conjunctivae normal.  Cardiovascular:     Rate and Rhythm: Normal rate and regular rhythm.     Pulses: Normal pulses.          Radial pulses are 2+ on the right side and 2+ on the left side.       Posterior tibial pulses are 2+ on the right side and 2+  on the left side.     Heart sounds: Normal heart sounds.     Comments: Tactile temperature in the extremities appropriate and equal bilaterally. Pulmonary:     Effort: Pulmonary effort is normal. No respiratory distress.     Breath sounds: Normal breath sounds.  Abdominal:     Palpations: Abdomen is soft.     Tenderness: There is no abdominal tenderness. There is no guarding.  Musculoskeletal:     Cervical back: Neck supple.     Right lower leg: 1+ Pitting Edema present.     Left lower leg: 1+ Pitting Edema present.  Lymphadenopathy:     Cervical: No cervical adenopathy.  Skin:    General: Skin is warm and dry.  Neurological:     Mental Status: He is alert.  Psychiatric:        Mood and Affect: Mood and affect normal.        Speech: Speech normal.        Behavior: Behavior normal.     ED Results / Procedures / Treatments   Labs (all labs ordered are listed, but only abnormal results are displayed) Labs Reviewed  BASIC METABOLIC PANEL - Abnormal; Notable for the following components:      Result Value   Sodium 129 (*)    Chloride 90 (*)    Glucose, Bld 443 (*)    Creatinine, Ser 1.32 (*)    GFR, Estimated 59 (*)    All other components within normal limits  URINALYSIS, ROUTINE W REFLEX MICROSCOPIC - Abnormal; Notable for the following components:   Glucose, UA >=500 (*)    Hgb urine dipstick LARGE (*)    Leukocytes,Ua SMALL (*)    Bacteria, UA RARE (*)    All other components within normal limits   BETA-HYDROXYBUTYRIC ACID - Abnormal; Notable for the following components:   Beta-Hydroxybutyric Acid 0.53 (*)    All other components within normal limits  BLOOD GAS, VENOUS - Abnormal; Notable for the following components:   pO2, Ven 31.7 (*)    Acid-Base Excess 5.8 (*)    All other components within normal limits  HEPATIC FUNCTION PANEL - Abnormal; Notable for the following components:   Total Protein 8.2 (*)    AST 116 (*)    ALT 113 (*)    Indirect Bilirubin 1.0 (*)    All other components within normal limits  CBG MONITORING, ED - Abnormal; Notable for the following components:   Glucose-Capillary 466 (*)    All other components within normal limits  CBG MONITORING, ED - Abnormal; Notable for the following components:   Glucose-Capillary 388 (*)    All other components within normal limits  CBG MONITORING, ED - Abnormal; Notable for the following components:   Glucose-Capillary 301 (*)    All other components within normal limits  CBC WITH DIFFERENTIAL/PLATELET  ETHANOL  LIPASE, BLOOD  HEPATITIS PANEL, ACUTE    EKG None  Radiology DG Chest 2 View  Result Date: 05/13/2021 CLINICAL DATA:  70 year old male with history of cough and dyspnea. COPD. EXAM: CHEST - 2 VIEW COMPARISON:  Chest x-ray 11/27/2019. FINDINGS: Lung volumes are increased with emphysematous changes. Mild linear scarring in the lingula, stable. No consolidative airspace disease. No pleural effusions. No pneumothorax. No pulmonary nodule or mass noted. Pulmonary vasculature and the cardiomediastinal silhouette are within normal limits. Atherosclerosis in the thoracic aorta. IMPRESSION: 1.  No radiographic evidence of acute cardiopulmonary disease. 2. Emphysema. 3. Aortic atherosclerosis. Electronically Signed  By: Vinnie Langton M.D.   On: 05/13/2021 09:24   CT ABDOMEN PELVIS W CONTRAST  Result Date: 05/13/2021 CLINICAL DATA:  70 year old male with abdominal distension. EXAM: CT ABDOMEN AND PELVIS WITH  CONTRAST TECHNIQUE: Multidetector CT imaging of the abdomen and pelvis was performed using the standard protocol following bolus administration of intravenous contrast. CONTRAST:  16m OMNIPAQUE IOHEXOL 300 MG/ML  SOLN COMPARISON:  CT abdomen pelvis dated 01/22/2016. FINDINGS: Lower chest: The visualized lung bases are clear. There is coronary vascular calcification. No intra-abdominal free air or free fluid. Hepatobiliary: Diffuse fatty liver. No intrahepatic biliary dilatation. Cholecystectomy. No retained calcified stone noted in the central CBD. Pancreas: Unremarkable. No pancreatic ductal dilatation or surrounding inflammatory changes. Spleen: Normal in size without focal abnormality. Adrenals/Urinary Tract: The adrenal glands unremarkable. There is no hydronephrosis on either side. There is symmetric enhancement and excretion of contrast by both kidneys. The visualized ureters and urinary bladder appear unremarkable. Stomach/Bowel: There is sigmoid diverticulosis without active inflammatory changes. There is no bowel obstruction or active inflammation. The appendix is normal. Vascular/Lymphatic: Moderate aortoiliac atherosclerotic disease. The IVC is unremarkable. No portal venous gas. There is no adenopathy. Reproductive: The prostate and seminal vesicles are grossly unremarkable. No pelvic mass. Other: None Musculoskeletal: Degenerative changes primarily at L5-S1. No acute osseous pathology. IMPRESSION: 1. No acute intra-abdominal or pelvic pathology. 2. Fatty liver. 3. Sigmoid diverticulosis. 4. Aortic Atherosclerosis (ICD10-I70.0). Electronically Signed   By: AAnner CreteM.D.   On: 05/13/2021 15:56    Procedures Procedures   Medications Ordered in ED Medications  dextrose 5 % in lactated ringers infusion (has no administration in time range)  dextrose 50 % solution 0-50 mL (has no administration in time range)  lactated ringers bolus 500 mL (0 mLs Intravenous Stopped 05/13/21 1631)  insulin  aspart (novoLOG) injection 10 Units (10 Units Intravenous Given 05/13/21 1453)  iohexol (OMNIPAQUE) 300 MG/ML solution 100 mL (100 mLs Intravenous Contrast Given 05/13/21 1538)  living well with diabetes book MISC ( Does not apply Given 05/13/21 1745)  insulin starter kit- pen needles (English) 1 kit (1 kit Other Given 05/13/21 1745)  insulin aspart (novoLOG) injection 10 Units (10 Units Intravenous Given 05/13/21 1742)  lactated ringers bolus 500 mL (0 mLs Intravenous Stopped 05/13/21 1836)  insulin glargine (LANTUS) injection 20 Units (20 Units Subcutaneous Given 05/13/21 1915)    ED Course  I have reviewed the triage vital signs and the nursing notes.  Pertinent labs & imaging results that were available during my care of the patient were reviewed by me and considered in my medical decision making (see chart for details).  Clinical Course as of 05/13/21 1934  Thu May 13, 2021  1400 BP: 95/78 I actually suspect this is an erroneous reading as patient does not have the appearance of someone who is hypotensive and blood pressure reading of this level was not repeated. [SJ]  1437 Spoke with Dr. SLivia Snellen patient's PCP.  He states he was concerned about the patient's appearance yesterday as he looked much more ill than he expected.  With his increased blood glucose and A1c, he would be concerned for an intra-abdominal process.  He requests a CT abdomen/pelvis with IV contrast. If patient's work-up is otherwise unremarkable, he does think patient could benefit from adding Lantus 20 units nightly.  He will follow up with the patient in the office within the next week. No need for repeat contact.  He will follow the patient's chart. [SJ]  1602 Sodium(!): 129  Corrects to 134 mEq/L Ron Parker, [phone]) or 137 mEq/L [place], 1999). [SJ]    Clinical Course User Index [SJ] Luverne Zerkle, Helane Gunther, PA-C   MDM Rules/Calculators/A&P                          Patient presents with hyperglycemia. Patient is nontoxic appearing,  afebrile, not tachycardic, not tachypneic, not hypotensive, maintains excellent SPO2 on room air, and is in no apparent distress.  It should be noted that there were some blood pressures logged with SBP under 100, but close repeat blood pressures were not consistently shown to be to this level, therefore I have a low suspicion for the accuracy of these blood pressures.  I have reviewed the patient's chart to obtain more information.   I reviewed and interpreted the patient's labs and radiological studies.  Hyperglycemia without evidence of DKA.  Elevated AST and ALT without elevation in total bilirubin, direct bilirubin, alk phos.  AST and ALT equally elevated. Very mild indirect bilirubinemia noted. Considerations would include hemolysis, overproduction, less likely Rosanna Randy syndrome, Wilson disease, hepatitis.  No encephalopathy, anemia, overt signs of acute illness.    No acute abnormalities on CT. Improvement in patient's blood glucose during ED course. Patient to be started on Lantus.  Diabetes coordinator and pharmacist consulted to counsel patient. The patient was given instructions for home care as well as return precautions. Patient voices understanding of these instructions, accepts the plan, and is comfortable with discharge.    Findings and plan of care discussed with attending physician, Lajean Saver, MD and then with Noemi Chapel, MD after EDP shift change.     Final Clinical Impression(s) / ED Diagnoses Final diagnoses:  Hyperglycemia    Rx / DC Orders ED Discharge Orders         Ordered    insulin glargine (LANTUS) 100 UNIT/ML injection  Daily at bedtime,   Status:  Discontinued        05/13/21 1606    insulin glargine (LANTUS) 100 UNIT/ML Solostar Pen  Daily        05/13/21 1842           Lorayne Bender, PA-C 05/13/21 1936    Lajean Saver, MD 05/19/21 1401

## 2021-05-13 NOTE — ED Notes (Signed)
Beeped Case Manager to [phone].Aldona Bar

## 2021-05-13 NOTE — Progress Notes (Signed)
Pt. Was notified of results and plan of action. Sent to E.D. for further evaluation WS

## 2021-05-13 NOTE — ED Triage Notes (Signed)
Pt reports he was seen by PCP yesterday for routine visit and bloodwork; was told his A1C has doubled but wasn't given a value; was told his CBG was in the 400's and instructed to double his Metformin; reports PCP called this am and told to come to ED; CBG in triage 466

## 2021-05-14 ENCOUNTER — Other Ambulatory Visit: Payer: Self-pay

## 2021-05-14 DIAGNOSIS — J449 Chronic obstructive pulmonary disease, unspecified: Secondary | ICD-10-CM

## 2021-05-14 LAB — HEPATITIS PANEL, ACUTE
HCV Ab: NONREACTIVE
Hep A IgM: NONREACTIVE
Hep B C IgM: NONREACTIVE
Hepatitis B Surface Ag: NONREACTIVE

## 2021-05-14 LAB — URINE CULTURE

## 2021-05-14 MED ORDER — ALBUTEROL SULFATE HFA 108 (90 BASE) MCG/ACT IN AERS: 2.0000 | INHALATION_SPRAY | Freq: Two times a day (BID) | RESPIRATORY_TRACT | 5 refills | Status: DC

## 2021-05-18 ENCOUNTER — Telehealth: Payer: Self-pay | Admitting: Family Medicine

## 2021-05-18 NOTE — Telephone Encounter (Signed)
I spoke with pt and scheduled appt with Juan Merritt for ER follow up 05/20/21 at 9:30.

## 2021-05-19 ENCOUNTER — Telehealth: Payer: Self-pay | Admitting: Family Medicine

## 2021-05-19 MED ORDER — INSULIN PEN NEEDLE 31G X 8 MM MISC
12 refills | Status: DC
Start: 2021-05-19 — End: 2023-12-01

## 2021-05-19 NOTE — Telephone Encounter (Signed)
Rx for pen needles sent to pharmacy.

## 2021-05-19 NOTE — Telephone Encounter (Signed)
Pt was prescribed Basaglar but needs the needles for tonight. The needle needs to be size 0.25 mm x 66mm 31G Use [facility]

## 2021-05-20 ENCOUNTER — Other Ambulatory Visit: Payer: Self-pay

## 2021-05-20 ENCOUNTER — Encounter: Payer: Self-pay | Admitting: Family Medicine

## 2021-05-20 ENCOUNTER — Telehealth: Payer: Self-pay | Admitting: Family Medicine

## 2021-05-20 ENCOUNTER — Ambulatory Visit (INDEPENDENT_AMBULATORY_CARE_PROVIDER_SITE_OTHER): Payer: Medicare HMO | Admitting: Family Medicine

## 2021-05-20 VITALS — BP 115/74 | HR 102 | Temp 97.7°F | Ht 71.0 in | Wt 292.4 lb

## 2021-05-20 DIAGNOSIS — Z09 Encounter for follow-up examination after completed treatment for conditions other than malignant neoplasm: Secondary | ICD-10-CM

## 2021-05-20 DIAGNOSIS — E1165 Type 2 diabetes mellitus with hyperglycemia: Secondary | ICD-10-CM

## 2021-05-20 NOTE — Patient Instructions (Signed)
Increase Lantus at bedtime by 2 units every 3 days for fasting blood sugar consistently over 150.

## 2021-05-20 NOTE — Telephone Encounter (Signed)
Spoke with patient about getting Libre or Dexcom. Advised Almyra Free will be calling him next Wednesday to discuss.

## 2021-05-20 NOTE — Progress Notes (Signed)
Established Patient Office Visit  Subjective:  Patient ID: Juan Merritt, male    DOB: February 06, 1951  Age: 70 y.o. MRN: [id number]  CC:  Chief Complaint  Patient presents with  . ER follow up    HPI SHAMAN MUSCARELLA presents for ER follow up. He was seen for his chronic follow up with his PCP. He was sent to the ED for elevated blood sugars and for not feeling well. He had a normal CT scan and reassuring labs. He was started on lantus 20 units at bedtime. He has been doing well with this. His fasting blood sugar has been slowing coming down from 496 to 255 over the last week. He has made changes to his diet. He feels significantly better and has more energy now.   Past Medical History:  Diagnosis Date  . Arthritis   . COPD (chronic obstructive pulmonary disease) (HCC)    not on home o2  . Diabetes mellitus without complication ([place])   . Gallstones   . Gout   . Hypertension   . Obesity   . Pneumonia     Past Surgical History:  Procedure Laterality Date  . CHOLECYSTECTOMY    . TONSILLECTOMY      Family History  Problem Relation Age of Onset  . Asthma Mother   . Diabetes Father   . Heart attack Father 58  . Esophageal cancer Paternal Uncle   . Lung disease Other        pat cousin  . Leukemia Brother   . Heart attack Other        pat cousin  . Colon cancer Neg Hx     Social History   Socioeconomic History  . Marital status: Married    Spouse name: [name]  . Number of children: 5  . Years of education: 10  . Highest education level: 10th grade  Occupational History  . Occupation: retired Nurse, mental health  . Smoking status: Former Smoker    Packs/day: 0.25    Years: 40.00    Pack years: 10.00    Types: Cigarettes, E-cigarettes    Quit date: 01/15/2017    Years since quitting: 4.3  . Smokeless tobacco: Current User    Types: Chew  Vaping Use  . Vaping Use: Some days  Substance and Sexual Activity  . Alcohol use: Yes    Comment: 1-2 beers  occasionally   . Drug use: No  . Sexual activity: Not Currently  Other Topics Concern  . Not on file  Social History Narrative   Lives at home with wife.  Grown children.  Retired Pharmacist, community.    Social Determinants of Health   Financial Resource Strain: Low Risk   . Difficulty of Paying Living Expenses: Not hard at all  Food Insecurity: No Food Insecurity  . Worried About Charity fundraiser in the Last Year: Never true  . Ran Out of Food in the Last Year: Never true  Transportation Needs: No Transportation Needs  . Lack of Transportation (Medical): No  . Lack of Transportation (Non-Medical): No  Physical Activity: Inactive  . Days of Exercise per Week: 0 days  . Minutes of Exercise per Session: 0 min  Stress: No Stress Concern Present  . Feeling of Stress : Not at all  Social Connections: Moderately Integrated  . Frequency of Communication with Friends and Family: More than three times a week  . Frequency of Social Gatherings with Friends and Family: More than three  times a week  . Attends Religious Services: 1 to 4 times per year  . Active Member of Clubs or Organizations: No  . Attends Archivist Meetings: Never  . Marital Status: Married  Human resources officer Violence: Not At Risk  . Fear of Current or Ex-Partner: No  . Emotionally Abused: No  . Physically Abused: No  . Sexually Abused: No    Outpatient Medications Prior to Visit  Medication Sig Dispense Refill  . albuterol (VENTOLIN HFA) 108 (90 Base) MCG/ACT inhaler Inhale 2 puffs into the lungs 2 (two) times daily. 18 g 5  . allopurinol (ZYLOPRIM) 100 MG tablet TAKE 1 TABLET (100 MG TOTAL) BY MOUTH DAILY. TO PREVENT GOUT (Patient taking differently: Take 100 mg by mouth daily.) 90 tablet 0  . amLODipine (NORVASC) 5 MG tablet TAKE 1 TABLET BY MOUTH EVERY DAY (Patient taking differently: Take 5 mg by mouth daily.) 90 tablet 0  . aspirin EC 81 MG tablet Take 81 mg by mouth daily. Swallow whole.    . insulin glargine  (LANTUS) 100 UNIT/ML Solostar Pen Inject 20 Units into the skin at bedtime. 15 mL 3  . Insulin Pen Needle 31G X 8 MM MISC Use to inject insulin daily. DX: E11.59 100 each 12  . ipratropium-albuterol (DUONEB) 0.5-2.5 (3) MG/3ML SOLN Take 3 mLs by nebulization every 4 (four) hours as needed. (Patient taking differently: Take 3 mLs by nebulization every 4 (four) hours as needed (SOB).) 360 mL 3  . losartan (COZAAR) 100 MG tablet TAKE 1 TABLET BY MOUTH EVERY DAY (Patient taking differently: Take 100 mg by mouth daily.) 90 tablet 0  . meloxicam (MOBIC) 15 MG tablet Take 1 tablet (15 mg total) by mouth daily. 90 tablet 1  . metFORMIN (GLUCOPHAGE-XR) 750 MG 24 hr tablet Take 2 tablets (1,500 mg total) by mouth daily with breakfast. (Patient taking differently: Take 1,000 mg by mouth daily with breakfast.) 60 tablet 5  . naproxen sodium (ALEVE) 220 MG tablet Take 220 mg by mouth daily as needed (pain).    . potassium chloride SA (KLOR-CON M20) 20 MEQ tablet TAKE 1 TABLET (20 MEQ TOTAL) DAILY BY MOUTH. (Patient taking differently: Take 20 mEq by mouth daily.) 90 tablet 0  . SYMBICORT 160-4.5 MCG/ACT inhaler INHALE 2 PUFFS INTO THE LUNGS IN THE MORNING AND AT BEDTIME. TAKE 2 PUFFS BY MOUTH TWICE A DAY (Patient taking differently: Inhale 2 puffs into the lungs in the morning and at bedtime.) 30.6 each 0  . torsemide (DEMADEX) 100 MG tablet Take 1 tablet (100 mg total) by mouth 2 (two) times daily. On arising and at lunchtime 180 tablet 3   No facility-administered medications prior to visit.    No Known Allergies  ROS Review of Systems As per HPI.    Objective:    Physical Exam Vitals and nursing note reviewed.  Constitutional:      General: He is not in acute distress.    Appearance: He is not ill-appearing, toxic-appearing or diaphoretic.  Cardiovascular:     Rate and Rhythm: Normal rate and regular rhythm.     Heart sounds: Normal heart sounds. No murmur heard.   Pulmonary:     Effort:  Pulmonary effort is normal. No respiratory distress.     Breath sounds: Normal breath sounds.  Abdominal:     General: Bowel sounds are normal.     Tenderness: There is no abdominal tenderness. There is no guarding or rebound.  Musculoskeletal:     Right  lower leg: No edema.     Left lower leg: No edema.  Skin:    General: Skin is warm and dry.  Neurological:     General: No focal deficit present.     Mental Status: He is alert and oriented to person, place, and time.  Psychiatric:        Mood and Affect: Mood normal.     BP 115/74   Pulse (!) 102   Temp 97.7 F (36.5 C)   Ht _0  (1.803 m)   Wt 292 lb 6.4 oz (132.6 kg)   SpO2 95%   BMI 40.78 kg/m  Wt Readings from Last 3 Encounters:  05/20/21 292 lb 6.4 oz (132.6 kg)  05/13/21 (!) 302 lb 3.2 oz (137.1 kg)  05/12/21 (!) 302 lb 3.2 oz (137.1 kg)     Health Maintenance Due  Topic Date Due  . OPHTHALMOLOGY EXAM  Never done  . COLONOSCOPY (Pts 45-8yr Insurance coverage will need to be confirmed)  Never done  . Zoster Vaccines- Shingrix (1 of 2) Never done  . COVID-19 Vaccine (3 - Booster for Moderna series) 08/25/2020    There are no preventive care reminders to display for this patient.  Lab Results  Component Value Date   TSH 0.452 03/30/2018   Lab Results  Component Value Date   WBC 8.3 05/13/2021   HGB 14.7 05/13/2021   HCT 42.1 05/13/2021   MCV 92.9 05/13/2021   PLT 253 05/13/2021   Lab Results  Component Value Date   NA 129 (L) 05/13/2021   K 3.5 05/13/2021   CO2 30 05/13/2021   GLUCOSE 443 (H) 05/13/2021   BUN 22 05/13/2021   CREATININE 1.32 (H) 05/13/2021   BILITOT 1.1 05/13/2021   ALKPHOS 69 05/13/2021   AST 116 (H) 05/13/2021   ALT 113 (H) 05/13/2021   PROT 8.2 (H) 05/13/2021   ALBUMIN 4.1 05/13/2021   CALCIUM 8.9 05/13/2021   ANIONGAP 9 05/13/2021   EGFR 66 05/12/2021   Lab Results  Component Value Date   CHOL 167 05/12/2021   Lab Results  Component Value Date   HDL 25 (L)  05/12/2021   Lab Results  Component Value Date   LDLCALC 65 05/12/2021   Lab Results  Component Value Date   TRIG 502 (H) 05/12/2021   Lab Results  Component Value Date   CHOLHDL 6.7 (H) 05/12/2021   Lab Results  Component Value Date   HGBA1C 12.5 (H) 05/12/2021      Assessment & Plan:   JTobiwas seen today for er follow up.  Diagnoses and all orders for this visit:  Type 2 diabetes mellitus with hyperglycemia, without long-term current use of insulin (HCC) Uncontrolled. Started on lantus 20 units last week. Fasting blood sugars have trended down to 240s for last 3 mornings. Continue diabetic diet. Increase lantus 2 units every 3 days for fasting blood sugar consistently over 150. He has a follow up appointment with his PCP in a few weeks.   Hospital discharge follow up Reviewed ED records.   Return for keep scheduled appointment with PCP.   The patient indicates understanding of these issues and agrees with the plan.   TGwenlyn Perking FNP

## 2021-05-20 NOTE — Telephone Encounter (Signed)
Pt has questions about checking his blood sugar. Please call back and advise.

## 2021-05-26 ENCOUNTER — Ambulatory Visit (INDEPENDENT_AMBULATORY_CARE_PROVIDER_SITE_OTHER): Payer: Medicare HMO | Admitting: Pharmacist

## 2021-05-26 DIAGNOSIS — E1165 Type 2 diabetes mellitus with hyperglycemia: Secondary | ICD-10-CM

## 2021-05-26 NOTE — Progress Notes (Signed)
Chronic Care Management Pharmacy Note  05/26/2021 Name:  Juan Merritt MRN:  [id number] DOB:  1951/07/30  Summary: DISCUSSED T2DM AND LIBRE 2 CGM DEVICE  Recommendations/Changes made from today's visit: Diabetes: Uncontrolled/controlled; current treatment:LANTUS 20 UNITS DAILY; METFORMIN; (WILL DISCUSS ADDING ANOTHER MEDICATION AS NEEDED; CGM WILL HELP IDENTIFY PROBLEM AREAS; F/U A1C) Current glucose readings: fasting glucose: NOT CHECKING, post prandial glucose: N/A REQUESTING LIBRE CGM TO AID IN GLYCEMIC AWARENESS   Plan: F/U PHARMD 6/23; F/U PCP 06/16/21  Subjective: Juan Merritt is an 70 y.o. year old male who is a primary patient of Stacks, Cletus Gash, MD.  The CCM team was consulted for assistance with disease management and care coordination needs.    Engaged with patient by telephone for initial visit in response to provider referral for pharmacy case management and/or care coordination services.   Consent to Services:  The patient was given the following information about Chronic Care Management services today, agreed to services, and gave verbal consent: 1. CCM service includes personalized support from designated clinical staff supervised by the primary care provider, including individualized plan of care and coordination with other care providers 2. 24/7 contact phone numbers for assistance for urgent and routine care needs. 3. Service will only be billed when office clinical staff spend 20 minutes or more in a month to coordinate care. 4. Only one practitioner may furnish and bill the service in a calendar month. 5.The patient may stop CCM services at any time (effective at the end of the month) by phone call to the office staff. 6. The patient will be responsible for cost sharing (co-pay) of up to 20% of the service fee (after annual deductible is met). Patient agreed to services and consent obtained.  Patient Care Team: [name], MD as PCP - General (Family  Medicine) Lavera Guise, [facility] as Pharmacist [facility] Medicine) Hospital visits: None in previous 6 months  Objective:  Lab Results  Component Value Date   CREATININE 1.32 (H) 05/13/2021   CREATININE 1.20 05/12/2021   CREATININE 1.23 02/08/2021    Lab Results  Component Value Date   HGBA1C 12.5 (H) 05/12/2021   Last diabetic Eye exam: No results found for: HMDIABEYEEXA  Last diabetic Foot exam: No results found for: HMDIABFOOTEX      Component Value Date/Time   CHOL 167 05/12/2021 0838   TRIG 502 (H) 05/12/2021 0838   HDL 25 (L) 05/12/2021 0838   CHOLHDL 6.7 (H) 05/12/2021 0838   LDLCALC 65 05/12/2021 0838    Hepatic Function Latest Ref Rng & Units 05/13/2021 05/12/2021 02/08/2021  Total Protein 6.5 - 8.1 g/dL 8.2(H) 7.3 7.7  Albumin 3.5 - 5.0 g/dL 4.1 4.2 4.4  AST 15 - 41 U/L 116(H) 86(H) 71(H)  ALT 0 - 44 U/L 113(H) 97(H) 90(H)  Alk Phosphatase 38 - 126 U/L 69 80 58  Total Bilirubin 0.3 - 1.2 mg/dL 1.1 0.5 0.5  Bilirubin, Direct 0.0 - 0.2 mg/dL 0.1 - -    Lab Results  Component Value Date/Time   TSH 0.452 03/30/2018 10:32 AM   TSH 1.920 07/18/2017 09:14 AM   TSH 1.542 03/23/2015 09:18 PM   FREET4 0.97 03/30/2018 10:32 AM    CBC Latest Ref Rng & Units 05/13/2021 05/12/2021 05/12/2021  WBC 4.0 - 10.5 K/uL 8.3 6.7 7.3  Hemoglobin 13.0 - 17.0 g/dL 14.7 14.2 14.2  Hematocrit 39.0 - 52.0 % 42.1 42.0 42.9  Platelets 150 - 400 K/uL 253 232 226    No  results found for: VD25OH  Clinical ASCVD: No  The 10-year ASCVD risk score Mikey Bussing DC Jr., et al., 2013) is: 40.4%   Values used to calculate the score:     Age: 50 years     Sex: Male     Is Non-Hispanic African American: No     Diabetic: Yes     Tobacco smoker: Yes     Systolic Blood Pressure: 672 mmHg     Is BP treated: Yes     HDL Cholesterol: 25 mg/dL     Total Cholesterol: 167 mg/dL    Other: (CHADS2VASc if Afib, PHQ9 if depression, MMRC or CAT for COPD, ACT, DEXA)  Social History   Tobacco Use  Smoking  Status Former   Packs/day: 0.25   Years: 40.00   Pack years: 10.00   Types: Cigarettes, E-cigarettes   Quit date: 01/15/2017   Years since quitting: 4.3  Smokeless Tobacco Current   Types: Chew   BP Readings from Last 3 Encounters:  05/20/21 115/74  05/13/21 115/68  05/12/21 124/71   Pulse Readings from Last 3 Encounters:  05/20/21 (!) 102  05/13/21 93  05/12/21 98   Wt Readings from Last 3 Encounters:  05/20/21 292 lb 6.4 oz (132.6 kg)  05/13/21 (!) 302 lb 3.2 oz (137.1 kg)  05/12/21 (!) 302 lb 3.2 oz (137.1 kg)    Assessment: Review of patient past medical history, allergies, medications, health status, including review of consultants reports, laboratory and other test data, was performed as part of comprehensive evaluation and provision of chronic care management services.   SDOH:  (Social Determinants of Health) assessments and interventions performed:    CCM Care Plan  No Known Allergies  Medications Reviewed Today     Reviewed by Lavera Guise, [facility] (Pharmacist) on 06/08/21 at 0925  Med List Status: <None>   Medication Order Taking? Sig Documenting Provider Last Dose Status Informant  albuterol (VENTOLIN HFA) 108 (90 Base) MCG/ACT inhaler [id number] No Inhale 2 puffs into the lungs 2 (two) times daily. [name], MD Taking Active   allopurinol (ZYLOPRIM) 100 MG tablet [id number]  TAKE 1 TABLET (100 MG TOTAL) BY MOUTH DAILY. TO PREVENT GOUT [name], MD  Active   amLODipine (NORVASC) 5 MG tablet [id number] No TAKE 1 TABLET BY MOUTH EVERY DAY  Patient taking differently: Take 5 mg by mouth daily.   [name], MD Taking Active   aspirin EC 81 MG tablet [id number] No Take 81 mg by mouth daily. Swallow whole. [provider] Taking Active Spouse/Significant Other  insulin glargine (LANTUS) 100 UNIT/ML Solostar Pen [id number] No Inject 20 Units into the skin at bedtime. Arlean Hopping C, PA-C Taking Active   Insulin Pen Needle 31G X 8 MM MISC [id number]  No Use to inject insulin daily. DX: E11.59 [name], MD Taking Active   ipratropium-albuterol (DUONEB) 0.5-2.5 (3) MG/3ML SOLN [id number] No Take 3 mLs by nebulization every 4 (four) hours as needed.  Patient taking differently: Take 3 mLs by nebulization every 4 (four) hours as needed (SOB).   [name], MD Taking Active   losartan (COZAAR) 100 MG tablet [id number] No TAKE 1 TABLET BY MOUTH EVERY DAY  Patient taking differently: Take 100 mg by mouth daily.   [name], MD Taking Active   meloxicam (MOBIC) 15 MG tablet [id number] No Take 1 tablet (15 mg total) by mouth daily. [name], MD Taking Active Spouse/Significant Other  metFORMIN (GLUCOPHAGE-XR) 750 MG 24 hr tablet [id number] No Take  2 tablets (1,500 mg total) by mouth daily with breakfast.  Patient taking differently: Take 1,000 mg by mouth daily with breakfast.   [name], MD Taking Active Spouse/Significant Other  naproxen sodium (ALEVE) 220 MG tablet [id number] No Take 220 mg by mouth daily as needed (pain). [provider] Taking Active Spouse/Significant Other  potassium chloride SA (KLOR-CON M20) 20 MEQ tablet [id number] No TAKE 1 TABLET (20 MEQ TOTAL) DAILY BY MOUTH.  Patient taking differently: Take 20 mEq by mouth daily.   [name], MD Taking Active   SYMBICORT 160-4.5 MCG/ACT inhaler [id number] No INHALE 2 PUFFS INTO THE LUNGS IN THE MORNING AND AT BEDTIME. TAKE 2 PUFFS BY MOUTH TWICE A DAY  Patient taking differently: Inhale 2 puffs into the lungs in the morning and at bedtime.   [name], MD Taking Active Spouse/Significant Other  torsemide (DEMADEX) 100 MG tablet [id number] No Take 1 tablet (100 mg total) by mouth 2 (two) times daily. On arising and at lunchtime [name], MD Taking Active Spouse/Significant Other            Patient Active Problem List   Diagnosis Date Noted   Educated about COVID-19 virus infection 06/23/2020   Nonspecific abnormal electrocardiogram  (ECG) (EKG) 06/23/2020   Hypertension associated with diabetes ([place]) 05/05/2020   Mixed hyperlipidemia 05/05/2020   Hyperuricemia 05/05/2020   Morbid obesity ([place]) 09/23/2019   Gout, chronic 07/01/2016   Incomplete rotator cuff tear 09/07/2015   Essential hypertension, benign 09/07/2015   COPD mixed type ([place]) 03/29/2015   Cigarette smoker 03/24/2015    Immunization History  Administered Date(s) Administered   Fluad Quad(high Dose 65+) 09/23/2019, 11/20/2020   Influenza,inj,Quad PF,6+ Mos 10/14/2016   Moderna Sars-Covid-2 Vaccination 02/26/2020, 03/25/2020   Pneumococcal Polysaccharide-23 09/07/2015    Conditions to be addressed/monitored: HTN, HLD, and DMII  Care Plan : PHARMD MEDICATION MANAGEMENT  Updates made by Lavera Guise, [place] since 06/08/2021 12:00 AM     Problem: DISEASE STATE PROGRESSION PREVENTION   Priority: High     Long-Range Goal: T2DM   Priority: High  Note:   Current Barriers:  Unable to independently monitor therapeutic efficacy--WOULD LIKE CGM Unable to achieve control of T2DM   Pharmacist Clinical Goal(s):  Over the next 90 days, patient will achieve control of T2DM as evidenced by A1C AT GOAL<7% OR DECREASE FROM 12.5%  through collaboration with PharmD and provider.    Interventions: 1:1 collaboration with [name], MD regarding development and update of comprehensive plan of care as evidenced by provider attestation and co-signature Inter-disciplinary care team collaboration (see longitudinal plan of care) Comprehensive medication review performed; medication list updated in electronic medical record  Diabetes: Uncontrolled/controlled; current treatment:LANTUS 20 UNITS DAILY; METFORMIN; (WILL DISCUSS ADDING ANOTHER MEDICATION AS NEEDED; CGM WILL HELP IDENTIFY PROBLEM AREAS; F/U A1C) Current glucose readings: fasting glucose: NOT CHECKING, post prandial glucose: N/A REQUESTING LIBRE CGM TO AID IN GLYCEMIC AWARENESS AND  CONTROL Denieshypoglycemic/hyperglycemic symptoms Current meal patterns: breakfast: Discussed meal planning options and Plate method for healthy eating Avoid sugary drinks and desserts Incorporate balanced protein, non starchy veggies, 1 serving of carbohydrate with each meal Increase water intake Increase physical activity as able Current exercise: N/A Collaborated with [place] 2 CGM SYSTEM WILL F/U TO [place]   Patient Goals/Self-Care Activities Over the next 90 days, patient will:  - take medications as prescribed check glucose CONTINUOUSLY USING CGM, document, and provide at future appointments  Follow  Up Plan: Telephone follow up appointment with care management team member scheduled for: 06/10/21      Medication Assistance: None required.  Patient affirms current coverage meets needs.  Patient's preferred pharmacy is:  [id number] [id number]: [phone]: [phone] Uses pill box? No -   Pt endorses 100% compliance  Follow Up:  Patient agrees to Care Plan and Follow-up.  Plan: Telephone follow up appointment with care management team member scheduled for:  06/10/21   JRegina Eck PharmD, BCPS Clinical Pharmacist, [place] Phone [phone]

## 2021-06-02 ENCOUNTER — Other Ambulatory Visit: Payer: Self-pay | Admitting: Family Medicine

## 2021-06-02 DIAGNOSIS — E790 Hyperuricemia without signs of inflammatory arthritis and tophaceous disease: Secondary | ICD-10-CM

## 2021-06-07 ENCOUNTER — Telehealth: Payer: Self-pay | Admitting: Family Medicine

## 2021-06-07 NOTE — Telephone Encounter (Signed)
Pts wife called to check on status of getting him a [place].  Please call wife [name]) [phone]

## 2021-06-08 NOTE — Telephone Encounter (Signed)
We are having difficulty finding a vendor that will take his insurance.  I submitted to several different places.  I will follow up with patient and wife on Thursday regarding this.  It will likely be another couple of weeks.  Keep checking blood glucose manually until we know of insurance approval.

## 2021-06-08 NOTE — Telephone Encounter (Signed)
Patients wife aware

## 2021-06-08 NOTE — Patient Instructions (Signed)
Visit Information  PATIENT GOALS:  Goals Addressed               This Visit's Progress     Patient Stated     T2DM (pt-stated)        Current Barriers:  Unable to independently monitor therapeutic efficacy--WOULD LIKE CGM Unable to achieve control of T2DM   Pharmacist Clinical Goal(s):  Over the next 90 days, patient will achieve control of T2DM as evidenced by A1C AT GOAL<7% OR DECREASE FROM 12.5% through collaboration with PharmD and provider.    Interventions: 1:1 collaboration with [name], MD regarding development and update of comprehensive plan of care as evidenced by provider attestation and co-signature Inter-disciplinary care team collaboration (see longitudinal plan of care) Comprehensive medication review performed; medication list updated in electronic medical record  Diabetes: Uncontrolled/controlled; current treatment:LANTUS 20 UNITS DAILY; METFORMIN; (WILL DISCUSS ADDING ANOTHER MEDICATION AS NEEDED; CGM WILL HELP IDENTIFY PROBLEM AREAS; F/U A1C) Current glucose readings: fasting glucose: NOT CHECKING, post prandial glucose: N/A REQUESTING LIBRE CGM TO AID IN GLYCEMIC AWARENESS AND CONTROL Denieshypoglycemic/hyperglycemic symptoms Current meal patterns: breakfast: Discussed meal planning options and Plate method for healthy eating Avoid sugary drinks and desserts Incorporate balanced protein, non starchy veggies, 1 serving of carbohydrate with each meal Increase water intake Increase physical activity as able Current exercise: N/A Collaborated with [place] 2 CGM SYSTEM WILL F/U TO [place]   Patient Goals/Self-Care Activities Over the next 90 days, patient will:  - take medications as prescribed check glucose CONTINUOUSLY USING CGM, document, and provide at future appointments  Follow Up Plan: Telephone follow up appointment with care management team member scheduled for: 06/10/21           The patient verbalized understanding of instructions, educational materials, and care plan provided today and declined offer to receive copy of patient instructions, educational materials, and care plan.   Telephone follow up appointment with care management team member scheduled for: 06/10/21  Signature Regina Eck, PharmD, BCPS Clinical Pharmacist, [place]  [facility] Phone [phone]

## 2021-06-10 ENCOUNTER — Ambulatory Visit: Payer: Self-pay | Admitting: Pharmacist

## 2021-06-10 DIAGNOSIS — E1165 Type 2 diabetes mellitus with hyperglycemia: Secondary | ICD-10-CM | POA: Diagnosis not present

## 2021-06-11 ENCOUNTER — Other Ambulatory Visit: Payer: Self-pay

## 2021-06-16 ENCOUNTER — Encounter: Payer: Self-pay | Admitting: Family Medicine

## 2021-06-16 ENCOUNTER — Ambulatory Visit (INDEPENDENT_AMBULATORY_CARE_PROVIDER_SITE_OTHER): Payer: Medicare HMO | Admitting: Family Medicine

## 2021-06-16 ENCOUNTER — Other Ambulatory Visit: Payer: Self-pay

## 2021-06-16 VITALS — BP 106/65 | HR 69 | Temp 97.6°F | Ht 71.0 in | Wt 281.8 lb

## 2021-06-16 DIAGNOSIS — E1165 Type 2 diabetes mellitus with hyperglycemia: Secondary | ICD-10-CM | POA: Diagnosis not present

## 2021-06-16 DIAGNOSIS — Z794 Long term (current) use of insulin: Secondary | ICD-10-CM | POA: Diagnosis not present

## 2021-06-16 MED ORDER — FREESTYLE LIBRE 2 SENSOR MISC: 0 refills | Status: DC

## 2021-06-16 NOTE — Patient Instructions (Signed)
Visit Information  PATIENT GOALS:  Goals Addressed               This Visit's Progress     Patient Stated     T2DM (pt-stated)        Current Barriers:  Unable to independently monitor therapeutic efficacy--WOULD LIKE CGM Unable to achieve control of T2DM   Pharmacist Clinical Goal(s):  Over the next 90 days, patient will achieve control of T2DM as evidenced by A1C AT GOAL<7% OR DECREASE FROM 12.5% through collaboration with PharmD and provider.    Interventions: 1:1 collaboration with [name], MD regarding development and update of comprehensive plan of care as evidenced by provider attestation and co-signature Inter-disciplinary care team collaboration (see longitudinal plan of care) Comprehensive medication review performed; medication list updated in electronic medical record  Diabetes: Uncontrolled/controlled; current treatment:LANTUS 20 UNITS DAILY; METFORMIN; (WILL DISCUSS ADDING ANOTHER MEDICATION AS NEEDED; CGM WILL HELP IDENTIFY PROBLEM AREAS; F/U A1C) Current glucose readings: fasting glucose: NOT CHECKING, post prandial glucose: N/A REQUESTING LIBRE CGM TO AID IN GLYCEMIC AWARENESS AND CONTROL Denies hypoglycemic/hyperglycemic symptoms Current meal patterns: breakfast: Discussed meal planning options and Plate method for healthy eating Avoid sugary drinks and desserts Incorporate balanced protein, non starchy veggies, 1 serving of carbohydrate with each meal Increase water intake Increase physical activity as able Current exercise: N/A Collaborated with [place] 2 CGM SYSTEM WILL F/U TO MAKE SURE PATIENT RECEIVES DEVICE IF APPROVED--APPROVAL STILL PENDING WITH ADVANCED DIABETES SUPPLY (VIA PARACHUTE PORTAL)--WE SHOULD KNOW IF APPROVED THIS WEEK, WILL FOLLOW UP PATIENT HAS PCP APPT ON 06/16/21, A1C WAS 12.5% IN 04/2021, WE WILL LIKELY HAVE TO ADD ADDITIONAL MEDICATIONS (CONSIDERING GLP1)   Patient Goals/Self-Care Activities Over the  next 90 days, patient will:  - take medications as prescribed check glucose CONTINUOUSLY USING CGM, document, and provide at future appointments  Follow Up Plan: Telephone follow up appointment with care management team member scheduled for: NEXT WEEK          The patient verbalized understanding of instructions, educational materials, and care plan provided today and declined offer to receive copy of patient instructions, educational materials, and care plan.   Telephone follow up appointment with care management team member scheduled for: 06/23/21  Signature Regina Eck, PharmD, BCPS Clinical Pharmacist, [place]  [facility] Phone [phone]

## 2021-06-16 NOTE — Progress Notes (Signed)
Chronic Care Management Pharmacy Note  06/10/2021 Name:  Juan Merritt MRN:  [id number] DOB:  1951/08/31  Summary: CGM FOLLOW UP/DIABETES  Recommendations/Changes made from today's visit:  Diabetes: Uncontrolled; current treatment:LANTUS 20 UNITS DAILY; METFORMIN; (WILL DISCUSS ADDING ANOTHER MEDICATION AS NEEDED; CGM WILL HELP IDENTIFY PROBLEM AREAS; F/U A1C) Current glucose readings: fasting glucose: NOT CHECKING, post prandial glucose: N/A REQUESTING LIBRE CGM TO AID IN GLYCEMIC AWARENESS AND CONTROL Denies hypoglycemic/hyperglycemic symptoms Current meal patterns: breakfast: Discussed meal planning options and Plate method for healthy eating Avoid sugary drinks and desserts Incorporate balanced protein, non starchy veggies, 1 serving of carbohydrate with each meal Increase water intake Increase physical activity as able Current exercise: N/A Collaborated with [facility] TO ORDER LIBRE 2 CGM SYSTEM WILL F/U TO MAKE SURE PATIENT RECEIVES DEVICE IF APPROVED--APPROVAL STILL PENDING WITH ADVANCED DIABETES SUPPLY (VIA PARACHUTE PORTAL)--WE SHOULD KNOW IF APPROVED THIS WEEK, WILL FOLLOW UP PATIENT HAS PCP APPT ON 06/16/21, A1C WAS 12.5% IN 04/2021, WE WILL LIKELY HAVE TO ADD ADDITIONAL MEDICATIONS (CONSIDERING GLP1)   Plan: F/U NEXT WEEK  Subjective: Juan Merritt is an 70 y.o. year old male who is a primary patient of Stacks, Cletus Gash, MD.  The CCM team was consulted for assistance with disease management and care coordination needs.    Engaged with patient by telephone for follow up visit in response to provider referral for pharmacy case management and/or care coordination services.   Consent to Services:  The patient was given the following information about Chronic Care Management services today, agreed to services, and gave verbal consent: 1. CCM service includes personalized support from designated clinical staff supervised by the primary care provider,  including individualized plan of care and coordination with other care providers 2. 24/7 contact phone numbers for assistance for urgent and routine care needs. 3. Service will only be billed when office clinical staff spend 20 minutes or more in a month to coordinate care. 4. Only one practitioner may furnish and bill the service in a calendar month. 5.The patient may stop CCM services at any time (effective at the end of the month) by phone call to the office staff. 6. The patient will be responsible for cost sharing (co-pay) of up to 20% of the service fee (after annual deductible is met). Patient agreed to services and consent obtained.  Patient Care Team: [name], MD as PCP - General (Family Medicine) Lavera Guise, [facility] as Pharmacist (Family Medicine)  Recent office visits: 04/2021 PCP SEEING AGAIN NEXT WEEK  Recent consult visits: [facility] visits: None in previous 6 months  Objective:  Lab Results  Component Value Date   CREATININE 1.32 (H) 05/13/2021   CREATININE 1.20 05/12/2021   CREATININE 1.23 02/08/2021    Lab Results  Component Value Date   HGBA1C 12.5 (H) 05/12/2021   Last diabetic Eye exam: No results found for: HMDIABEYEEXA  Last diabetic Foot exam: No results found for: HMDIABFOOTEX      Component Value Date/Time   CHOL 167 05/12/2021 0838   TRIG 502 (H) 05/12/2021 0838   HDL 25 (L) 05/12/2021 0838   CHOLHDL 6.7 (H) 05/12/2021 0838   LDLCALC 65 05/12/2021 0838    Hepatic Function Latest Ref Rng & Units 05/13/2021 05/12/2021 02/08/2021  Total Protein 6.5 - 8.1 g/dL 8.2(H) 7.3 7.7  Albumin 3.5 - 5.0 g/dL 4.1 4.2 4.4  AST 15 - 41 U/L 116(H) 86(H) 71(H)  ALT 0 - 44 U/L 113(H) 97(H)  90(H)  Alk Phosphatase 38 - 126 U/L 69 80 58  Total Bilirubin 0.3 - 1.2 mg/dL 1.1 0.5 0.5  Bilirubin, Direct 0.0 - 0.2 mg/dL 0.1 - -    Lab Results  Component Value Date/Time   TSH 0.452 03/30/2018 10:32 AM   TSH 1.920 07/18/2017 09:14 AM   TSH 1.542 03/23/2015  09:18 PM   FREET4 0.97 03/30/2018 10:32 AM    CBC Latest Ref Rng & Units 05/13/2021 05/12/2021 05/12/2021  WBC 4.0 - 10.5 K/uL 8.3 6.7 7.3  Hemoglobin 13.0 - 17.0 g/dL 14.7 14.2 14.2  Hematocrit 39.0 - 52.0 % 42.1 42.0 42.9  Platelets 150 - 400 K/uL 253 232 226    No results found for: VD25OH  Clinical ASCVD: No  The 10-year ASCVD risk score Mikey Bussing DC Jr., et al., 2013) is: 36.1%   Values used to calculate the score:     Age: 76 years     Sex: Male     Is Non-Hispanic African American: No     Diabetic: Yes     Tobacco smoker: Yes     Systolic Blood Pressure: 654 mmHg     Is BP treated: Yes     HDL Cholesterol: 25 mg/dL     Total Cholesterol: 167 mg/dL    Other: (CHADS2VASc if Afib, PHQ9 if depression, MMRC or CAT for COPD, ACT, DEXA)  Social History   Tobacco Use  Smoking Status Former   Packs/day: 0.25   Years: 40.00   Pack years: 10.00   Types: Cigarettes, E-cigarettes   Quit date: 01/15/2017   Years since quitting: 4.4  Smokeless Tobacco Current   Types: Chew   BP Readings from Last 3 Encounters:  06/16/21 106/65  05/20/21 115/74  05/13/21 115/68   Pulse Readings from Last 3 Encounters:  06/16/21 69  05/20/21 (!) 102  05/13/21 93   Wt Readings from Last 3 Encounters:  06/16/21 281 lb 12.8 oz (127.8 kg)  05/20/21 292 lb 6.4 oz (132.6 kg)  05/13/21 (!) 302 lb 3.2 oz (137.1 kg)    Assessment: Review of patient past medical history, allergies, medications, health status, including review of consultants reports, laboratory and other test data, was performed as part of comprehensive evaluation and provision of chronic care management services.   SDOH:  (Social Determinants of Health) assessments and interventions performed:    CCM Care Plan  No Known Allergies  Medications Reviewed Today     Reviewed by Lavera Guise, [facility] (Pharmacist) on 06/16/21 at 1141  Med List Status: <None>   Medication Order Taking? Sig Documenting Provider Last Dose Status  Informant  albuterol (VENTOLIN HFA) 108 (90 Base) MCG/ACT inhaler [id number] No Inhale 2 puffs into the lungs 2 (two) times daily. [name], MD Taking Active   allopurinol (ZYLOPRIM) 100 MG tablet [id number] No TAKE 1 TABLET (100 MG TOTAL) BY MOUTH DAILY. TO PREVENT GOUT [name], MD Taking Active   amLODipine (NORVASC) 5 MG tablet [id number] No TAKE 1 TABLET BY MOUTH EVERY DAY  Patient taking differently: Take 5 mg by mouth daily.   [name], MD Taking Active   aspirin EC 81 MG tablet [id number] No Take 81 mg by mouth daily. Swallow whole. [provider] Taking Active Spouse/Significant Other  insulin glargine (LANTUS) 100 UNIT/ML Solostar Pen [id number] No Inject 20 Units into the skin at bedtime. Arlean Hopping C, PA-C Taking Active   Insulin Pen Needle 31G X 8 MM MISC [id number] No Use to inject insulin daily. DX:  E11.59 [name], MD Taking Active   ipratropium-albuterol (DUONEB) 0.5-2.5 (3) MG/3ML SOLN [id number] No Take 3 mLs by nebulization every 4 (four) hours as needed.  Patient taking differently: Take 3 mLs by nebulization every 4 (four) hours as needed (SOB).   [name], MD Taking Active   losartan (COZAAR) 100 MG tablet [id number] No TAKE 1 TABLET BY MOUTH EVERY DAY  Patient taking differently: Take 100 mg by mouth daily.   [name], MD Taking Active   meloxicam (MOBIC) 15 MG tablet [id number] No Take 1 tablet (15 mg total) by mouth daily. [name], MD Taking Active Spouse/Significant Other  metFORMIN (GLUCOPHAGE-XR) 750 MG 24 hr tablet [id number] No Take 2 tablets (1,500 mg total) by mouth daily with breakfast.  Patient taking differently: Take 1,000 mg by mouth daily with breakfast.   [name], MD Taking Active Spouse/Significant Other  naproxen sodium (ALEVE) 220 MG tablet [id number] No Take 220 mg by mouth daily as needed (pain). [provider] Taking Active Spouse/Significant Other  potassium chloride SA (KLOR-CON M20) 20 MEQ  tablet [id number] No TAKE 1 TABLET (20 MEQ TOTAL) DAILY BY MOUTH.  Patient taking differently: Take 20 mEq by mouth daily.   [name], MD Taking Active   SYMBICORT 160-4.5 MCG/ACT inhaler [id number] No INHALE 2 PUFFS INTO THE LUNGS IN THE MORNING AND AT BEDTIME. TAKE 2 PUFFS BY MOUTH TWICE A DAY  Patient taking differently: Inhale 2 puffs into the lungs in the morning and at bedtime.   [name], MD Taking Active Spouse/Significant Other  torsemide (DEMADEX) 100 MG tablet [id number] No Take 1 tablet (100 mg total) by mouth 2 (two) times daily. On arising and at lunchtime [name], MD Taking Active Spouse/Significant Other            Patient Active Problem List   Diagnosis Date Noted   Educated about COVID-19 virus infection 06/23/2020   Nonspecific abnormal electrocardiogram (ECG) (EKG) 06/23/2020   Hypertension associated with diabetes ([place]) 05/05/2020   Mixed hyperlipidemia 05/05/2020   Hyperuricemia 05/05/2020   Morbid obesity ([place]) 09/23/2019   Gout, chronic 07/01/2016   Incomplete rotator cuff tear 09/07/2015   Essential hypertension, benign 09/07/2015   COPD mixed type ([place]) 03/29/2015   Cigarette smoker 03/24/2015    Immunization History  Administered Date(s) Administered   Fluad Quad(high Dose 65+) 09/23/2019, 11/20/2020   Influenza,inj,Quad PF,6+ Mos 10/14/2016   Moderna Sars-Covid-2 Vaccination 02/26/2020, 03/25/2020   Pneumococcal Polysaccharide-23 09/07/2015    Conditions to be addressed/monitored: HTN, HLD, and DMII  Care Plan : PHARMD MEDICATION MANAGEMENT  Updates made by Lavera Guise, [place] since 06/16/2021 12:00 AM     Problem: DISEASE STATE PROGRESSION PREVENTION   Priority: High     Long-Range Goal: T2DM   This Visit's Progress: Not on track  Priority: High  Note:   Current Barriers:  Unable to independently monitor therapeutic efficacy--WOULD LIKE CGM Unable to achieve control of T2DM   Pharmacist Clinical Goal(s):  Over the  next 90 days, patient will achieve control of T2DM as evidenced by A1C AT GOAL<7% OR DECREASE FROM 12.5%  through collaboration with PharmD and provider.    Interventions: 1:1 collaboration with [name], MD regarding development and update of comprehensive plan of care as evidenced by provider attestation and co-signature Inter-disciplinary care team collaboration (see longitudinal plan of care) Comprehensive medication review performed; medication list updated in electronic medical record  Diabetes: Uncontrolled/controlled; current treatment:LANTUS 20 UNITS DAILY; METFORMIN; (WILL DISCUSS ADDING ANOTHER MEDICATION AS  NEEDED; CGM WILL HELP IDENTIFY PROBLEM AREAS; F/U A1C) Current glucose readings: fasting glucose: NOT CHECKING, post prandial glucose: N/A REQUESTING LIBRE CGM TO AID IN GLYCEMIC AWARENESS AND CONTROL Denies hypoglycemic/hyperglycemic symptoms Current meal patterns: breakfast: Discussed meal planning options and Plate method for healthy eating Avoid sugary drinks and desserts Incorporate balanced protein, non starchy veggies, 1 serving of carbohydrate with each meal Increase water intake Increase physical activity as able Current exercise: N/A Collaborated with [facility] TO ORDER LIBRE 2 CGM SYSTEM WILL F/U TO MAKE SURE PATIENT RECEIVES DEVICE IF APPROVED--APPROVAL STILL PENDING WITH ADVANCED DIABETES SUPPLY (VIA [employer] PORTAL)--WE SHOULD KNOW IF APPROVED THIS WEEK, WILL FOLLOW UP PATIENT HAS PCP APPT ON 06/16/21, A1C WAS 12.5% IN 04/2021, WE WILL LIKELY HAVE TO ADD ADDITIONAL MEDICATIONS (CONSIDERING GLP1)   Patient Goals/Self-Care Activities Over the next 90 days, patient will:  - take medications as prescribed check glucose CONTINUOUSLY USING CGM, document, and provide at future appointments  Follow Up Plan: Telephone follow up appointment with care management team member scheduled for: NEXT WEEK      Medication Assistance: None required.  Patient  affirms current coverage meets needs.  Patient's preferred pharmacy is:  [id number] [id number]: [phone]: [phone] Uses pill box? No -   Pt endorses 100% compliance  Follow Up:  Patient agrees to Care Plan and Follow-up.  Plan: Telephone follow up appointment with care management team member scheduled for:  06/23/21  JRegina Eck PharmD, BCPS Clinical Pharmacist, [place] Phone [phone]

## 2021-06-16 NOTE — Progress Notes (Signed)
Subjective:  Patient ID: Juan Merritt, male    DOB: [birth year]  Age: 70 y.o. MRN: [id number]  CC: Follow-up (DM)   HPI Juan Merritt presents for follow-up of his hyperglycemia.  His blood sugar came back nearly 500 last month and he was sent to the emergency room.  As a result he has been started on 20 units of Lantus at bedtime as well as increasing his metformin to 1500 mg a day.  He has both 500s and 750s he is taking one of the other to total 1500.  He brings in his blood sugar readings showing that there is been a significant drop over the months time that was sent to be scanned into his file.  Of note is that the sugar gradually dropped from almost 500 within a day or 2 was down to 400 then to 300 within a week it was down to about 200 and now his fastings are running in the 100-1 20 range for the last week or 2.  His postprandials are similarly improved starting off at 466 and gradually dropping down to in the last week 10 4-1 65.  His weight is down 20 pounds.  He has been eliminating starches including bread potatoes and sodas.  Depression screen [facility] 2/9 06/16/2021 05/20/2021 05/20/2021  Decreased Interest 0 0 0  Down, Depressed, Hopeless 0 0 0  PHQ - 2 Score 0 0 0  Altered sleeping - 0 -  Tired, decreased energy - 2 -  Change in appetite - 0 -  Feeling bad or failure about yourself  - 0 -  Trouble concentrating - 0 -  Moving slowly or fidgety/restless - 0 -  Suicidal thoughts - 0 -  PHQ-9 Score - 2 -  Difficult doing work/chores - Not difficult at all -  Some recent data might be hidden    History Juan Merritt has a past medical history of Arthritis, COPD (chronic obstructive pulmonary disease) ([place]), Diabetes mellitus without complication ([place]), Gallstones, Gout, Hypertension, Obesity, and Pneumonia.   He has a past surgical history that includes Cholecystectomy and Tonsillectomy.   His family history includes Asthma in his mother; Diabetes in his father; Esophageal cancer in  his paternal uncle; Heart attack in an other family member; Heart attack (age of onset: 38) in his father; Leukemia in his brother; Lung disease in an other family member.He reports that he quit smoking about 4 years ago. His smoking use included cigarettes and e-cigarettes. He has a 10.00 pack-year smoking history. His smokeless tobacco use includes chew. He reports current alcohol use. He reports that he does not use drugs.    ROS Review of Systems  Constitutional:  Negative for fever.  Respiratory:  Negative for shortness of breath.   Cardiovascular:  Negative for chest pain.  Musculoskeletal:  Negative for arthralgias.  Skin:  Negative for rash.   Objective:  BP 106/65   Pulse 69   Temp 97.6 F (36.4 C)   Ht 5\' 11"  (1.803 m)   Wt 281 lb 12.8 oz (127.8 kg)   SpO2 96%   BMI 39.30 kg/m   BP Readings from Last 3 Encounters:  06/16/21 106/65  05/20/21 115/74  05/13/21 115/68    Wt Readings from Last 3 Encounters:  06/16/21 281 lb 12.8 oz (127.8 kg)  05/20/21 292 lb 6.4 oz (132.6 kg)  05/13/21 (!) 302 lb 3.2 oz (137.1 kg)     Physical Exam Vitals reviewed.  Constitutional:  Appearance: He is well-developed.  HENT:     Head: Normocephalic and atraumatic.     Right Ear: External ear normal.     Left Ear: External ear normal.     Mouth/Throat:     Pharynx: No oropharyngeal exudate or posterior oropharyngeal erythema.  Eyes:     Pupils: Pupils are equal, round, and reactive to light.  Cardiovascular:     Rate and Rhythm: Normal rate and regular rhythm.     Heart sounds: No murmur heard. Pulmonary:     Effort: No respiratory distress.     Breath sounds: Normal breath sounds.  Musculoskeletal:     Cervical back: Normal range of motion and neck supple.  Neurological:     Mental Status: He is alert and oriented to person, place, and time.      Assessment & Plan:   Juan Merritt was seen today for follow-up.  Diagnoses and all orders for this visit:  Type 2  diabetes mellitus with hyperglycemia, with long-term current use of insulin ([place])      I am having Juan Merritt maintain his torsemide, ipratropium-albuterol, losartan, meloxicam, potassium chloride SA, Symbicort, amLODipine, metFORMIN, naproxen sodium, aspirin EC, insulin glargine, albuterol, Insulin Pen Needle, and allopurinol.  Allergies as of 06/16/2021   No Known Allergies      Medication List        Accurate as of June 16, 2021  8:52 AM. If you have any questions, ask your nurse or doctor.          albuterol 108 (90 Base) MCG/ACT inhaler Commonly known as: VENTOLIN HFA Inhale 2 puffs into the lungs 2 (two) times daily.   allopurinol 100 MG tablet Commonly known as: ZYLOPRIM TAKE 1 TABLET (100 MG TOTAL) BY MOUTH DAILY. TO PREVENT GOUT   amLODipine 5 MG tablet Commonly known as: NORVASC TAKE 1 TABLET BY MOUTH EVERY DAY   aspirin EC 81 MG tablet Take 81 mg by mouth daily. Swallow whole.   insulin glargine 100 UNIT/ML Solostar Pen Commonly known as: LANTUS Inject 20 Units into the skin at bedtime.   Insulin Pen Needle 31G X 8 MM Misc Use to inject insulin daily. DX: E11.59   ipratropium-albuterol 0.5-2.5 (3) MG/3ML Soln Commonly known as: DUONEB Take 3 mLs by nebulization every 4 (four) hours as needed. What changed: reasons to take this   losartan 100 MG tablet Commonly known as: COZAAR TAKE 1 TABLET BY MOUTH EVERY DAY   meloxicam 15 MG tablet Commonly known as: MOBIC Take 1 tablet (15 mg total) by mouth daily.   metFORMIN 750 MG 24 hr tablet Commonly known as: GLUCOPHAGE-XR Take 2 tablets (1,500 mg total) by mouth daily with breakfast. What changed: how much to take   naproxen sodium 220 MG tablet Commonly known as: ALEVE Take 220 mg by mouth daily as needed (pain).   potassium chloride SA 20 MEQ tablet Commonly known as: [place]-Con M20 TAKE 1 TABLET (20 MEQ TOTAL) DAILY BY MOUTH. What changed:  how much to take how to take this when to  take this additional instructions   Symbicort 160-4.5 MCG/ACT inhaler Generic drug: budesonide-formoterol INHALE 2 PUFFS INTO THE LUNGS IN THE MORNING AND AT BEDTIME. TAKE 2 PUFFS BY MOUTH TWICE A DAY What changed: See the new instructions.   torsemide 100 MG tablet Commonly known as: DEMADEX Take 1 tablet (100 mg total) by mouth 2 (two) times daily. On arising and at lunchtime         Follow-up:  No follow-ups on file.  [name], M.D.

## 2021-06-23 ENCOUNTER — Telehealth: Payer: Self-pay

## 2021-06-25 ENCOUNTER — Telehealth: Payer: Self-pay | Admitting: Family Medicine

## 2021-06-25 ENCOUNTER — Other Ambulatory Visit: Payer: Self-pay | Admitting: Family Medicine

## 2021-06-25 ENCOUNTER — Ambulatory Visit (INDEPENDENT_AMBULATORY_CARE_PROVIDER_SITE_OTHER): Payer: Medicare HMO | Admitting: Pharmacist

## 2021-06-25 DIAGNOSIS — Z794 Long term (current) use of insulin: Secondary | ICD-10-CM

## 2021-06-25 DIAGNOSIS — E1165 Type 2 diabetes mellitus with hyperglycemia: Secondary | ICD-10-CM

## 2021-06-25 DIAGNOSIS — R609 Edema, unspecified: Secondary | ICD-10-CM

## 2021-06-25 MED ORDER — CONTOUR NEXT TEST VI STRP
ORAL_STRIP | 12 refills | Status: DC
Start: 2021-06-25 — End: 2021-06-25

## 2021-06-25 MED ORDER — MICROLET LANCETS MISC: 12 refills | Status: DC

## 2021-06-25 MED ORDER — MICROLET NEXT LANCING DEVICE MISC: 0 refills | Status: DC

## 2021-06-25 NOTE — Progress Notes (Signed)
Chronic Care Management Pharmacy Note  06/25/2021 Name:  Juan Merritt MRN:  [id number] DOB:  09-09-1951  Summary: diabetes management  Recommendations/Changes made from today's visit: Diabetes: Uncontrolled; current treatment:LANTUS 58 UNITS DAILY; METFORMIN; (WILL DISCUSS ADDING ANOTHER MEDICATION AS NEEDED; CGM WILL HELP IDENTIFY PROBLEM AREAS; F/U A1C)--considering GLP1 therapy Current glucose readings: fasting glucose: NOT CHECKING, post prandial glucose: N/A REQUESTING LIBRE CGM TO AID IN GLYCEMIC AWARENESS AND CONTROL Advanced diabetes supply is waiting on authorization from [employer] in Contour Next test strips and lancets so patient can use traditional meter in the meantime Denies hypoglycemic/hyperglycemic symptoms Current meal patterns: breakfast: Discussed meal planning options and Plate method for healthy eating Avoid sugary drinks and desserts Incorporate balanced protein, non starchy veggies, 1 serving of carbohydrate with each meal Increase water intake Increase physical activity as able Current exercise: N/A Collaborated with [facility] TO ORDER LIBRE 2 CGM SYSTEM WILL F/U TO MAKE SURE PATIENT RECEIVES DEVICE IF APPROVED--APPROVAL STILL PENDING WITH ADVANCED DIABETES SUPPLY (VIA [facility])--WE SHOULD KNOW IF APPROVED THIS WEEK, WILL FOLLOW UP  Plan: f/u in 2 weeks  Subjective: Juan Merritt is an 70 y.o. year old male who is a primary patient of Stacks, Cletus Gash, MD.  The CCM team was consulted for assistance with disease management and care coordination needs.    Engaged with patient by telephone for follow up visit in response to provider referral for pharmacy case management and/or care coordination services.   Consent to Services:  The patient was given information about Chronic Care Management services, agreed to services, and gave verbal consent prior to initiation of services.  Please see initial visit note for detailed  documentation.   Patient Care Team: [name], MD as PCP - General (Family Medicine) Lavera Guise, [facility] as Pharmacist (Family Medicine)  Recent office visits: 06/16/21  Recent consult visits: [facility] visits: ED on 05/13/21 for hyperglycemia  Objective:  Lab Results  Component Value Date   CREATININE 1.32 (H) 05/13/2021   CREATININE 1.20 05/12/2021   CREATININE 1.23 02/08/2021    Lab Results  Component Value Date   HGBA1C 12.5 (H) 05/12/2021   Last diabetic Eye exam: No results found for: HMDIABEYEEXA  Last diabetic Foot exam: No results found for: HMDIABFOOTEX      Component Value Date/Time   CHOL 167 05/12/2021 0838   TRIG 502 (H) 05/12/2021 0838   HDL 25 (L) 05/12/2021 0838   CHOLHDL 6.7 (H) 05/12/2021 0838   LDLCALC 65 05/12/2021 0838    Hepatic Function Latest Ref Rng & Units 05/13/2021 05/12/2021 02/08/2021  Total Protein 6.5 - 8.1 g/dL 8.2(H) 7.3 7.7  Albumin 3.5 - 5.0 g/dL 4.1 4.2 4.4  AST 15 - 41 U/L 116(H) 86(H) 71(H)  ALT 0 - 44 U/L 113(H) 97(H) 90(H)  Alk Phosphatase 38 - 126 U/L 69 80 58  Total Bilirubin 0.3 - 1.2 mg/dL 1.1 0.5 0.5  Bilirubin, Direct 0.0 - 0.2 mg/dL 0.1 - -    Lab Results  Component Value Date/Time   TSH 0.452 03/30/2018 10:32 AM   TSH 1.920 07/18/2017 09:14 AM   TSH 1.542 03/23/2015 09:18 PM   FREET4 0.97 03/30/2018 10:32 AM    CBC Latest Ref Rng & Units 05/13/2021 05/12/2021 05/12/2021  WBC 4.0 - 10.5 K/uL 8.3 6.7 7.3  Hemoglobin 13.0 - 17.0 g/dL 14.7 14.2 14.2  Hematocrit 39.0 - 52.0 % 42.1 42.0 42.9  Platelets 150 - 400 K/uL 253 232 226  No results found for: VD25OH  Clinical ASCVD: No  The 10-year ASCVD risk score Mikey Bussing DC Jr., et al., 2013) is: 36.1%   Values used to calculate the score:     Age: 70 years     Sex: Male     Is Non-Hispanic African American: No     Diabetic: Yes     Tobacco smoker: Yes     Systolic Blood Pressure: 417 mmHg     Is BP treated: Yes     HDL Cholesterol: 25 mg/dL      Total Cholesterol: 167 mg/dL    Other: (CHADS2VASc if Afib, PHQ9 if depression, MMRC or CAT for COPD, ACT, DEXA)  Social History   Tobacco Use  Smoking Status Former   Packs/day: 0.25   Years: 40.00   Pack years: 10.00   Types: Cigarettes, E-cigarettes   Quit date: 01/15/2017   Years since quitting: 4.4  Smokeless Tobacco Current   Types: Chew   BP Readings from Last 3 Encounters:  06/16/21 106/65  05/20/21 115/74  05/13/21 115/68   Pulse Readings from Last 3 Encounters:  06/16/21 69  05/20/21 (!) 102  05/13/21 93   Wt Readings from Last 3 Encounters:  06/16/21 281 lb 12.8 oz (127.8 kg)  05/20/21 292 lb 6.4 oz (132.6 kg)  05/13/21 (!) 302 lb 3.2 oz (137.1 kg)    Assessment: Review of patient past medical history, allergies, medications, health status, including review of consultants reports, laboratory and other test data, was performed as part of comprehensive evaluation and provision of chronic care management services.   SDOH:  (Social Determinants of Health) assessments and interventions performed:    CCM Care Plan  No Known Allergies  Medications Reviewed Today     Reviewed by Lavera Guise, [facility] (Pharmacist) on 06/25/21 at 1350  Med List Status: <None>   Medication Order Taking? Sig Documenting Provider Last Dose Status Informant  albuterol (VENTOLIN HFA) 108 (90 Base) MCG/ACT inhaler [id number] No Inhale 2 puffs into the lungs 2 (two) times daily. [name], MD Taking Active   allopurinol (ZYLOPRIM) 100 MG tablet [id number] No TAKE 1 TABLET (100 MG TOTAL) BY MOUTH DAILY. TO PREVENT GOUT [name], MD Taking Active   amLODipine (NORVASC) 5 MG tablet [id number] No TAKE 1 TABLET BY MOUTH EVERY DAY  Patient taking differently: Take 5 mg by mouth daily.   [name], MD Taking Active   aspirin EC 81 MG tablet [id number] No Take 81 mg by mouth daily. Swallow whole. [provider] Taking Active Spouse/Significant Other  Continuous Blood Gluc  Sensor (FREESTYLE LIBRE 2 SENSOR) MISC [id number]  USE TO TEST BLOOD SUGAR CONTINUOUSLY. Dx. E11.65 SEND TO [facility] FOR MEDICARE COVERAGE, DO NOT SEND TO LOCAL PHARMACY Stacks, [place], MD  Active   insulin glargine (LANTUS) 100 UNIT/ML Solostar Pen [id number] No Inject 20 Units into the skin at bedtime. Arlean Hopping C, PA-C Taking Active   Insulin Pen Needle 31G X 8 MM MISC [id number] No Use to inject insulin daily. DX: E11.59 [name], MD Taking Active   ipratropium-albuterol (DUONEB) 0.5-2.5 (3) MG/3ML SOLN [id number] No Take 3 mLs by nebulization every 4 (four) hours as needed.  Patient taking differently: Take 3 mLs by nebulization every 4 (four) hours as needed (SOB).   [name], MD Taking Active   losartan (COZAAR) 100 MG tablet [id number] No TAKE 1 TABLET BY MOUTH EVERY DAY  Patient taking differently: Take 100 mg by mouth daily.   [name],  MD Taking Active   meloxicam (MOBIC) 15 MG tablet [id number] No Take 1 tablet (15 mg total) by mouth daily. [name], MD Taking Active Spouse/Significant Other  metFORMIN (GLUCOPHAGE-XR) 750 MG 24 hr tablet [id number] No Take 2 tablets (1,500 mg total) by mouth daily with breakfast.  Patient taking differently: Take 1,000 mg by mouth daily with breakfast.   [name], MD Taking Active Spouse/Significant Other  naproxen sodium (ALEVE) 220 MG tablet [id number] No Take 220 mg by mouth daily as needed (pain). [provider] Taking Active Spouse/Significant Other  potassium chloride SA (KLOR-CON M20) 20 MEQ tablet [id number] No TAKE 1 TABLET (20 MEQ TOTAL) DAILY BY MOUTH.  Patient taking differently: Take 20 mEq by mouth daily.   [name], MD Taking Active   SYMBICORT 160-4.5 MCG/ACT inhaler [id number] No INHALE 2 PUFFS INTO THE LUNGS IN THE MORNING AND AT BEDTIME. TAKE 2 PUFFS BY MOUTH TWICE A DAY  Patient taking differently: Inhale 2 puffs into the lungs in the morning and at bedtime.   [name], MD Taking  Active Spouse/Significant Other  torsemide (DEMADEX) 100 MG tablet [id number]  TAKE 1 TABLET (100 MG TOTAL) BY MOUTH 2 (TWO) TIMES DAILY. ON ARISING AND AT [place], MD  Active             Patient Active Problem List   Diagnosis Date Noted   Educated about COVID-19 virus infection 06/23/2020   Nonspecific abnormal electrocardiogram (ECG) (EKG) 06/23/2020   Hypertension associated with diabetes ([place]) 05/05/2020   Mixed hyperlipidemia 05/05/2020   Hyperuricemia 05/05/2020   Morbid obesity ([place]) 09/23/2019   Gout, chronic 07/01/2016   Incomplete rotator cuff tear 09/07/2015   Essential hypertension, benign 09/07/2015   COPD mixed type ([place]) 03/29/2015   Cigarette smoker 03/24/2015    Immunization History  Administered Date(s) Administered   Fluad Quad(high Dose 65+) 09/23/2019, 11/20/2020   Influenza,inj,Quad PF,6+ Mos 10/14/2016   Moderna Sars-Covid-2 Vaccination 02/26/2020, 03/25/2020   Pneumococcal Polysaccharide-23 09/07/2015    Conditions to be addressed/monitored: HTN, HLD, and DMII  Care Plan : PHARMD MEDICATION MANAGEMENT  Updates made by Lavera Guise, [place] since 06/25/2021 12:00 AM     Problem: DISEASE STATE PROGRESSION PREVENTION   Priority: High     Long-Range Goal: T2DM   Recent Progress: Not on track  Priority: High  Note:   Current Barriers:  Unable to independently monitor therapeutic efficacy--WOULD LIKE CGM Unable to achieve control of T2DM   Pharmacist Clinical Goal(s):  Over the next 90 days, patient will achieve control of T2DM as evidenced by A1C AT GOAL<7% OR DECREASE FROM 12.5%  through collaboration with PharmD and provider.    Interventions: 1:1 collaboration with [name], MD regarding development and update of comprehensive plan of care as evidenced by provider attestation and co-signature Inter-disciplinary care team collaboration (see longitudinal plan of care) Comprehensive medication review performed;  medication list updated in electronic medical record  Diabetes: Uncontrolled; current treatment:LANTUS 20 UNITS DAILY; METFORMIN; (WILL DISCUSS ADDING ANOTHER MEDICATION AS NEEDED; CGM WILL HELP IDENTIFY PROBLEM AREAS; F/U A1C)--considering GLP1 therapy Current glucose readings: fasting glucose: NOT CHECKING, post prandial glucose: N/A REQUESTING LIBRE CGM TO AID IN GLYCEMIC AWARENESS AND CONTROL Advanced diabetes supply is waiting on authorization from [employer] in Contour Next test strips and lancets so patient can use traditional meter in the meantime Denies hypoglycemic/hyperglycemic symptoms Current meal patterns: breakfast: Discussed meal planning options and Plate method for healthy eating Avoid sugary drinks and desserts Incorporate  balanced protein, non starchy veggies, 1 serving of carbohydrate with each meal Increase water intake Increase physical activity as able Current exercise: N/A Collaborated with [place] 2 CGM SYSTEM WILL F/U TO MAKE SURE PATIENT RECEIVES DEVICE IF APPROVED--APPROVAL STILL PENDING WITH ADVANCED DIABETES SUPPLY (VIA [employer])--WE SHOULD KNOW IF APPROVED THIS WEEK, WILL FOLLOW UP  Patient Goals/Self-Care Activities Over the next 90 days, patient will:  - take medications as prescribed check glucose CONTINUOUSLY USING CGM, document, and provide at future appointments  Follow Up Plan: Telephone follow up appointment with care management team member scheduled for: NEXT WEEK      Medication Assistance: None required.  Patient affirms current coverage meets needs.  [id number] [id number]: [phone]: [phone] Follow Up:  Patient agrees to Care Plan and Follow-up.  Plan: Telephone follow up appointment with care management team member scheduled for:  2 weeks  JRegina Eck PharmD, BCPS Clinical Pharmacist, [place] Phone [phone]

## 2021-06-25 NOTE — Patient Instructions (Signed)
Visit Information  PATIENT GOALS:  Goals Addressed               This Visit's Progress     Patient Stated     T2DM (pt-stated)        Current Barriers:  Unable to independently monitor therapeutic efficacy--WOULD LIKE CGM Unable to achieve control of T2DM   Pharmacist Clinical Goal(s):  Over the next 90 days, patient will achieve control of T2DM as evidenced by A1C AT GOAL<7% OR DECREASE FROM 12.5% through collaboration with PharmD and provider.    Interventions: 1:1 collaboration with [name], MD regarding development and update of comprehensive plan of care as evidenced by provider attestation and co-signature Inter-disciplinary care team collaboration (see longitudinal plan of care) Comprehensive medication review performed; medication list updated in electronic medical record  Diabetes: Uncontrolled; current treatment:LANTUS 20 UNITS DAILY; METFORMIN; (WILL DISCUSS ADDING ANOTHER MEDICATION AS NEEDED; CGM WILL HELP IDENTIFY PROBLEM AREAS; F/U A1C)--considering GLP1 therapy Current glucose readings: fasting glucose: NOT CHECKING, post prandial glucose: N/A REQUESTING LIBRE CGM TO AID IN GLYCEMIC AWARENESS AND CONTROL Advanced diabetes supply is waiting on authorization from [employer] in [place] Next test strips and lancets so patient can use traditional meter in the meantime Denies hypoglycemic/hyperglycemic symptoms Current meal patterns: breakfast: Discussed meal planning options and Plate method for healthy eating Avoid sugary drinks and desserts Incorporate balanced protein, non starchy veggies, 1 serving of carbohydrate with each meal Increase water intake Increase physical activity as able Current exercise: N/A Collaborated with [place] 2 CGM SYSTEM WILL F/U TO MAKE SURE PATIENT RECEIVES DEVICE IF APPROVED--APPROVAL STILL PENDING WITH ADVANCED DIABETES SUPPLY ([employer]IA [employer])--WE SHOULD KNOW IF APPROVED THIS WEEK,  WILL FOLLOW UP  Patient Goals/Self-Care Activities Over the next 90 days, patient will:  - take medications as prescribed check glucose CONTINUOUSLY USING CGM, document, and provide at future appointments  Follow Up Plan: Telephone follow up appointment with care management team member scheduled for: NEXT WEEK          The patient verbalized understanding of instructions, educational materials, and care plan provided today and declined offer to receive copy of patient instructions, educational materials, and care plan.   Telephone follow up appointment with care management team member scheduled for: 2weeks  Regina Eck, PharmD, BCPS Clinical Pharmacist, [place] Phone [phone]

## 2021-06-25 NOTE — Telephone Encounter (Signed)
Sent in Rxs for contour next test strips, lancing device and lancets per patient request

## 2021-06-25 NOTE — Telephone Encounter (Signed)
  Prescription Request  06/25/2021  What is the name of the medication or equipment? Lancets Contour Next Test Strips  Have you contacted your pharmacy to request a refill? (if applicable) Yes  Which pharmacy [facility] you [facility] [place]  Pts wife called stating that she really needs someone to call in Rx for pt to get lancets countour next test strips so that she can ck pts blood sugar because he is completely out.  Please call wife Juan Merritt) to let her know if Rx was sent to pharmacy.

## 2021-06-25 NOTE — Telephone Encounter (Signed)
Message left for wife that rx were sent to pharmacy.

## 2021-06-26 ENCOUNTER — Other Ambulatory Visit: Payer: Self-pay | Admitting: Family Medicine

## 2021-06-26 DIAGNOSIS — I10 Essential (primary) hypertension: Secondary | ICD-10-CM

## 2021-06-26 DIAGNOSIS — I152 Hypertension secondary to endocrine disorders: Secondary | ICD-10-CM

## 2021-06-26 DIAGNOSIS — E1159 Type 2 diabetes mellitus with other circulatory complications: Secondary | ICD-10-CM

## 2021-06-28 ENCOUNTER — Telehealth: Payer: Self-pay | Admitting: *Deleted

## 2021-06-28 MED ORDER — ONETOUCH ULTRA 2 W/DEVICE KIT: PACK | 0 refills | Status: DC

## 2021-06-28 MED ORDER — ONETOUCH ULTRA VI STRP: ORAL_STRIP | 3 refills | Status: DC

## 2021-06-28 NOTE — Telephone Encounter (Signed)
Call from pt's wife, contour test strips not covered, need one touch meter & test strips sent to pharmacy  Also checking on [place]

## 2021-06-29 DIAGNOSIS — E1165 Type 2 diabetes mellitus with hyperglycemia: Secondary | ICD-10-CM | POA: Diagnosis not present

## 2021-06-29 NOTE — Addendum Note (Signed)
Addended by: Lottie Dawson D on: 06/29/2021 08:39 AM   Modules accepted: Orders

## 2021-06-30 ENCOUNTER — Other Ambulatory Visit: Payer: Self-pay | Admitting: Family Medicine

## 2021-06-30 DIAGNOSIS — I10 Essential (primary) hypertension: Secondary | ICD-10-CM

## 2021-07-02 ENCOUNTER — Telehealth: Payer: Self-pay | Admitting: Family Medicine

## 2021-07-02 NOTE — Telephone Encounter (Signed)
No answer, no voicemail.

## 2021-07-05 NOTE — Telephone Encounter (Signed)
Contacted patient.  Patient states situation is resolved.  No need for further action

## 2021-07-09 ENCOUNTER — Telehealth: Payer: Self-pay | Admitting: Family Medicine

## 2021-07-09 MED ORDER — MICROLET LANCETS MISC: 12 refills | Status: DC

## 2021-07-09 NOTE — Telephone Encounter (Signed)
[facility] [place]

## 2021-07-09 NOTE — Telephone Encounter (Signed)
  Prescription Request  07/09/2021  What is the name of the medication or equipment? One touch Ultra 2 lancets  Have you contacted your pharmacy to request a refill? (if applicable) NO  Which [facility] [place]   Patient notified that their request is being sent to the clinical staff for review and that they should receive a response within 2 business days.

## 2021-07-12 ENCOUNTER — Other Ambulatory Visit: Payer: Self-pay | Admitting: Family Medicine

## 2021-07-12 DIAGNOSIS — J449 Chronic obstructive pulmonary disease, unspecified: Secondary | ICD-10-CM

## 2021-07-30 DIAGNOSIS — E1165 Type 2 diabetes mellitus with hyperglycemia: Secondary | ICD-10-CM | POA: Diagnosis not present

## 2021-08-16 ENCOUNTER — Ambulatory Visit: Payer: Medicare HMO | Admitting: Family Medicine

## 2021-08-18 ENCOUNTER — Ambulatory Visit (INDEPENDENT_AMBULATORY_CARE_PROVIDER_SITE_OTHER): Payer: Medicare HMO

## 2021-08-18 VITALS — Ht 71.0 in | Wt 260.0 lb

## 2021-08-18 DIAGNOSIS — Z0000 Encounter for general adult medical examination without abnormal findings: Secondary | ICD-10-CM | POA: Diagnosis not present

## 2021-08-18 NOTE — Progress Notes (Signed)
Subjective:   Juan Merritt is a 70 y.o. male who presents for Medicare Annual/Subsequent preventive examination. Virtual Visit via Telephone Note  I connected with  Juan Merritt on 08/18/21 at  9:00 AM EDT by telephone and verified that I am speaking with the correct person using two identifiers.  Location: Patient: Home Provider: [facility] Persons participating in the virtual visit: patient/Nurse Health Advisor   I discussed the limitations, risks, security and privacy concerns of performing an evaluation and management service by telephone and the availability of in person appointments. The patient expressed understanding and agreed to proceed.  Interactive audio and video telecommunications were attempted between this nurse and patient, however failed, due to patient having technical difficulties OR patient did not have access to video capability.  We continued and completed visit with audio only.  Some vital signs may be absent or patient reported.    E , LPN  Review of Systems     Cardiac Risk Factors include: advanced age (>49mn, >>95women);male gender;diabetes mellitus;dyslipidemia;hypertension;obesity (BMI >30kg/m2);sedentary lifestyle;smoking/ tobacco exposure;Other (see comment) (Athelerosclerosis COPD)     Objective:    Today's Vitals   08/18/21 0914  Weight: 260 lb (117.9 kg)  Height: 5' 11" (1.803 m)   Body mass index is 36.26 kg/m.  Advanced Directives 08/18/2021 05/13/2021 08/17/2020 11/27/2019 08/14/2019 03/30/2018 07/20/2015  Does Patient Have a Medical Advance Directive? Yes No Yes Yes No Yes No  Type of [place] - Living will [place]-  Does patient want to make changes to medical advance directive? - - No - Patient declined No - Patient declined - No - Patient declined -  [place] of [place]? No - copy  requested - - No - copy requested - No - copy requested -  Would patient like information on creating a medical advance directive? - No - Patient declined - - No - Patient declined - -    Current Medications (verified) Outpatient Encounter Medications as of 08/18/2021  Medication Sig   albuterol (VENTOLIN HFA) 108 (90 Base) MCG/ACT inhaler Inhale 2 puffs into the lungs 2 (two) times daily.   allopurinol (ZYLOPRIM) 100 MG tablet TAKE 1 TABLET (100 MG TOTAL) BY MOUTH DAILY. TO PREVENT GOUT   amLODipine (NORVASC) 5 MG tablet TAKE 1 TABLET BY MOUTH EVERY DAY   aspirin EC 81 MG tablet Take 81 mg by mouth daily. Swallow whole.   Blood Glucose Monitoring Suppl (ONE TOUCH ULTRA 2) w/Device KIT TEST BLOOD SUGAR 3 TIMES DAILY AS DIRECTED. DX: E11.65   Continuous Blood Gluc Sensor (FREESTYLE LIBRE 2 SENSOR) MISC USE TO TEST BLOOD SUGAR CONTINUOUSLY. Dx. E11.65 SEND TO [facility] FOR MEDICARE COVERAGE, DO NOT SEND TO LOCAL PHARMACY   glucose blood (ONETOUCH ULTRA) test strip TEST BLOOD SUGAR 3 TIMES DAILY AS DIRECTED. DX: E11.65   insulin glargine (LANTUS) 100 UNIT/ML Solostar Pen Inject 20 Units into the skin at bedtime.   Insulin Pen Needle 31G X 8 MM MISC Use to inject insulin daily. DX: E11.59   ipratropium-albuterol (DUONEB) 0.5-2.5 (3) MG/3ML SOLN Take 3 mLs by nebulization every 4 (four) hours as needed. (Patient taking differently: Take 3 mLs by nebulization every 4 (four) hours as needed (SOB).)   losartan (COZAAR) 100 MG tablet TAKE 1 TABLET BY MOUTH EVERY DAY   meloxicam (MOBIC) 15 MG tablet Take 1 tablet (15 mg total) by mouth daily.  metFORMIN (GLUCOPHAGE-XR) 750 MG 24 hr tablet Take 2 tablets (1,500 mg total) by mouth daily with breakfast. (Patient taking differently: Take 1,000 mg by mouth daily with breakfast.)   Microlet Lancets MISC Use to test blood sugar 3 times daily as directed.; DX:e.11.65   potassium chloride SA (KLOR-CON M20) 20 MEQ tablet TAKE 1 TABLET (20 MEQ TOTAL) DAILY BY  MOUTH.   SYMBICORT 160-4.5 MCG/ACT inhaler INHALE 2 PUFFS INTO THE LUNGS IN THE MORNING AND AT BEDTIME. TAKE 2 PUFFS BY MOUTH TWICE A DAY   torsemide (DEMADEX) 100 MG tablet TAKE 1 TABLET (100 MG TOTAL) BY MOUTH 2 (TWO) TIMES DAILY. ON ARISING AND AT LUNCHTIME   naproxen sodium (ALEVE) 220 MG tablet Take 220 mg by mouth daily as needed (pain). (Patient not taking: Reported on 08/18/2021)   No facility-administered encounter medications on file as of 08/18/2021.    Allergies (verified) Patient has no known allergies.   History: Past Medical History:  Diagnosis Date   Arthritis    COPD (chronic obstructive pulmonary disease) ([place])    not on home o2   Diabetes mellitus without complication ([place])    Gallstones    Gout    Hypertension    Obesity    Pneumonia    Past Surgical History:  Procedure Laterality Date   CHOLECYSTECTOMY     TONSILLECTOMY     Family History  Problem Relation Age of Onset   Asthma Mother    Diabetes Father    Heart attack Father 22   Esophageal cancer Paternal Uncle    Lung disease Other        pat cousin   Leukemia Brother    Heart attack Other        pat cousin   Colon cancer Neg Hx    Social History   Socioeconomic History   Marital status: Married    Spouse name: [name]   Number of children: 5   Years of education: 10   Highest education level: 10th grade  Occupational History   Occupation: retired Administrator  Tobacco Use   Smoking status: Former    Packs/day: 0.25    Years: 40.00    Pack years: 10.00    Types: Cigarettes, E-cigarettes    Quit date: 01/15/2017    Years since quitting: 4.5   Smokeless tobacco: Current    Types: Chew  Vaping Use   Vaping Use: Some days  Substance and Sexual Activity   Alcohol use: Yes    Comment: 1-2 beers occasionally    Drug use: No   Sexual activity: Not Currently  Other Topics Concern   Not on file  Social History Narrative   Lives at home with wife.  Grown children.  Retired Pharmacist, community.  Children live nearby.    Social Determinants of Health   Financial Resource Strain: Low Risk    Difficulty of Paying Living Expenses: Not hard at all  Food Insecurity: No Food Insecurity   Worried About Charity fundraiser in the Last Year: Never true   [place] in the Last Year: Never true  Transportation Needs: No Transportation Needs   Lack of Transportation (Medical): No   Lack of Transportation (Non-Medical): No  Physical Activity: Sufficiently Active   Days of Exercise per Week: 5 days   Minutes of Exercise per Session: 30 min  Stress: No Stress Concern Present   Feeling of Stress : Not at all  Social Connections: Moderately Integrated   Frequency of  Communication with Friends and Family: More than three times a week   Frequency of Social Gatherings with Friends and Family: More than three times a week   Attends Religious Services: Never   Marine scientist or Organizations: Yes   Attends Archivist Meetings: Never   Marital Status: Married    Tobacco Counseling Ready to quit: Not Answered Counseling given: Not Answered   Clinical Intake:  Pre-visit preparation completed: Yes  Pain : No/denies pain     BMI - recorded: 36.26 Nutritional Status: BMI > 30  Obese Nutritional Risks: None Diabetes: Yes CBG done?: No (112 at home) CBG resulted in Enter/ Edit results?: No Did pt. bring in CBG monitor from home?: No (phone visit)  How often do you need to have someone help you when you read instructions, pamphlets, or other written materials from your doctor or pharmacy?: 1 - Never  Diabetic? Nutrition Risk Assessment:  Has the patient had any N/V/D within the last 2 months?  No  Does the patient have any non-healing wounds?  No  Has the patient had any unintentional weight loss or weight gain?  No   Diabetes:  Is the patient diabetic?  Yes  If diabetic, was a CBG obtained today?  Yes  Did the patient bring in their glucometer from home?   No  How often do you monitor your CBG's? 2 x per day, CMS.   Financial Strains and Diabetes Management:  Are you having any financial strains with the device, your supplies or your medication? No .  Does the patient want to be seen by Chronic Care Management for management of their diabetes?  No  Would the patient like to be referred to a Nutritionist or for Diabetic Management?  No   Diabetic Exams:  Diabetic Eye Exam: Completed 2 years. Overdue for diabetic eye exam. Pt has been advised about the importance in completing this exam. A referral has been placed today. Message sent to referral coordinator for scheduling purposes. Advised pt to expect a call from office referred to regarding appt.  Diabetic Foot Exam: Completed 02/08/21 Pt has been advised about the importance in completing this exam. Pt is scheduled for diabetic foot exam next year.    Interpreter Needed?: No  Information entered by ::  , LPN   Activities of Daily Living In your present state of health, do you have any difficulty performing the following activities: 08/18/2021  Hearing? N  Vision? N  Difficulty concentrating or making decisions? N  Walking or climbing stairs? N  Dressing or bathing? N  Doing errands, shopping? N  Preparing Food and eating ? N  Using the Toilet? N  In the past six months, have you accidently leaked urine? N  Do you have problems with loss of bowel control? N  Managing your Medications? N  Managing your Finances? N  Housekeeping or managing your Housekeeping? N  Some recent data might be hidden    Patient Care Team: [name], MD as PCP - General (Family Medicine) Lavera Guise, [facility] as Pharmacist (Family Medicine)  Indicate any recent Medical Services you may have received from other than [facility] providers in the past year (date may be approximate).     Assessment:   This is a routine wellness examination for Juan Merritt.  Hearing/Vision screen Hearing Screening -  Comments:: Some hearing issues. Vision Screening - Comments:: Glasses.Last exam 2020.  [facility]. Encouraged pt to call and schedule appt.  Dietary issues  and exercise activities discussed: Current Exercise Habits: Home exercise routine, Type of exercise: walking, Time (Minutes): 30, Frequency (Times/Week): 5, Weekly Exercise (Minutes/Week): 150, Intensity: Mild, Exercise limited by: cardiac condition(s);respiratory conditions(s)   Goals Addressed             This Visit's Progress    Weight (lb) < 250 lb (113.4 kg)   260 lb (117.9 kg)    PT has lost 23#       Depression Screen PHQ 2/9 Scores 08/18/2021 06/16/2021 05/20/2021 05/20/2021 05/12/2021 05/12/2021 02/08/2021  PHQ - 2 Score 0 0 0 0 0 0 0  PHQ- 9 Score - - 2 - 4 - -    Fall Risk Fall Risk  08/18/2021 06/16/2021 05/20/2021 05/12/2021 02/08/2021  Falls in the past year? 0 0 0 0 0  Number falls in past yr: 0 - - - -  Injury with Fall? 0 - - - -  Risk for fall due to : No Fall Risks - - - -  Follow up Falls prevention discussed - - - Falls evaluation completed  Comment - - - - -    FALL RISK PREVENTION PERTAINING TO THE HOME:  Any stairs in or around the home? No  If so, are there any without handrails? No  Home free of loose throw rugs in walkways, pet beds, electrical cords, etc? Yes  Adequate lighting in your home to reduce risk of falls? Yes   ASSISTIVE DEVICES UTILIZED TO PREVENT FALLS:  Life alert? No  Use of a cane, walker or w/c? No  Grab bars in the bathroom? Yes  Shower chair or bench in shower? No  Elevated toilet seat or a handicapped toilet? No   TIMED UP AND GO:  Was the test performed? No . Phone visit    Cognitive Function: Normal cognitive status assessed by direct observation by this Nurse Health Advisor. No abnormalities found.       6CIT Screen 08/17/2020 08/14/2019  What Year? 0 points 0 points  What month? 0 points 0 points  What time? 0 points 0 points  Count back from 20 0 points 0 points   Months in reverse 0 points 2 points  Repeat phrase 0 points 2 points  Total Score 0 4    Immunizations Immunization History  Administered Date(s) Administered   Fluad Quad(high Dose 65+) 09/23/2019, 11/20/2020   Influenza,inj,Quad PF,6+ Mos 10/14/2016   Moderna Sars-Covid-2 Vaccination 02/26/2020, 03/25/2020   Pneumococcal Polysaccharide-23 09/07/2015    TDAP status: Due, Education has been provided regarding the importance of this vaccine. Advised may receive this vaccine at local pharmacy or Health Dept. Aware to provide a copy of the vaccination record if obtained from local pharmacy or Health Dept. Verbalized acceptance and understanding.  Flu Vaccine status: Due, Education has been provided regarding the importance of this vaccine. Advised may receive this vaccine at local pharmacy or Health Dept. Aware to provide a copy of the vaccination record if obtained from local pharmacy or Health Dept. Verbalized acceptance and understanding.  Pneumococcal vaccine status: Due, Education has been provided regarding the importance of this vaccine. Advised may receive this vaccine at local pharmacy or Health Dept. Aware to provide a copy of the vaccination record if obtained from local pharmacy or Health Dept. Verbalized acceptance and understanding.  Covid-19 vaccine status: Completed vaccines  Qualifies for Shingles Vaccine? Yes   Zostavax completed No   Shingrix Completed?: No.    Education has been provided regarding the importance of this  vaccine. Patient has been advised to call insurance company to determine out of pocket expense if they have not yet received this vaccine. Advised may also receive vaccine at local pharmacy or Health Dept. Verbalized acceptance and understanding.  Screening Tests Health Maintenance  Topic Date Due   OPHTHALMOLOGY EXAM  Never done   Fecal DNA (Cologuard)  Never done   COVID-19 Vaccine (3 - Booster for Moderna series) 08/25/2020   INFLUENZA VACCINE   07/19/2021   PNA vac Low Risk Adult (1 of 2 - PCV13) 08/19/2021 (Originally 07/20/2018)   Zoster Vaccines- Shingrix (1 of 2) 09/16/2021 (Originally 07/21/2003)   TETANUS/TDAP  02/08/2022 (Originally 07/20/1970)   HEMOGLOBIN A1C  11/12/2021   FOOT EXAM  02/08/2022   Hepatitis C Screening  Completed   HPV VACCINES  Aged Out    Health Maintenance  Health Maintenance Due  Topic Date Due   OPHTHALMOLOGY EXAM  Never done   Fecal DNA (Cologuard)  Never done   COVID-19 Vaccine (3 - Booster for Moderna series) 08/25/2020   INFLUENZA VACCINE  07/19/2021    Colorectal Cancer Screening, Cologuard ordered, hasn't returned yet.   Lung Cancer Screening: (Low Dose CT Chest recommended if Age 30-80 years, 30 pack-year currently smoking OR have quit w/in 15years.) does not qualify  Additional Screening:  Hepatitis C Screening: does qualify; Completed 05/13/21  Vision Screening: Recommended annual ophthalmology exams for early detection of glaucoma and other disorders of the eye. Is the patient up to date with their annual eye exam?  No  Who is the provider or what is the name of the office in which the patient attends annual eye exams? [facility] If pt is not established with a provider, would they like to be referred to a provider to establish care? No .   Dental Screening: Recommended annual dental exams for proper oral hygiene  Community Resource Referral / Chronic Care Management: CRR required this visit?  No   CCM required this visit?  No      Plan:     I have personally reviewed and noted the following in the patient's chart:   Medical and social history Use of alcohol, tobacco or illicit drugs  Current medications and supplements including opioid prescriptions. Patient is not currently taking opioid prescriptions. Functional ability and status Nutritional status Physical activity Advanced directives List of other physicians Hospitalizations, surgeries, and ER visits in  previous 12 months Vitals Screenings to include cognitive, depression, and falls Referrals and appointments  In addition, I have reviewed and discussed with patient certain preventive protocols, quality metrics, and best practice recommendations. A written personalized care plan for preventive services as well as general preventive health recommendations were provided to patient.     Sandrea Hammond, LPN   [id number]   Nurse Notes: None

## 2021-08-18 NOTE — Patient Instructions (Signed)
Juan Merritt , Thank you for taking time to come for your Medicare Wellness Visit. I appreciate your ongoing commitment to your health goals. Please review the following plan we discussed and let me know if I can assist you in the future.   Screening recommendations/referrals: Colonoscopy: Due, has Cologuard kit Recommended yearly ophthalmology/optometry visit for glaucoma screening and checkup Recommended yearly dental visit for hygiene and checkup  Vaccinations: Influenza vaccine: 11/20/20, Due in fall Pneumococcal vaccine: 09/07/15, due for Prevnar Tdap vaccine: Due (every 10 years) Shingles vaccine: Shingrix discussed. Please contact your pharmacy for coverage information.    Covid-19: 02/26/20, 03/25/20  Advanced directives: Please bring a copy of your health care power of attorney and living will to the office to be added to your chart at your convenience.   Conditions/risks identified: Aim for 30 minutes of exercise or brisk walking each day, drink 6-8 glasses of water and eat lots of fruits and vegetables.   Next appointment: Follow up in one year for your annual wellness visit.   Preventive Care 56 Years and Older, Male  Preventive care refers to lifestyle choices and visits with your health care provider that can promote health and wellness. What does preventive care include? A yearly physical exam. This is also called an annual well check. Dental exams once or twice a year. Routine eye exams. Ask your health care provider how often you should have your eyes checked. Personal lifestyle choices, including: Daily care of your teeth and gums. Regular physical activity. Eating a healthy diet. Avoiding tobacco and drug use. Limiting alcohol use. Practicing safe sex. Taking low doses of aspirin every day. Taking vitamin and mineral supplements as recommended by your health care provider. What happens during an annual well check? The services and screenings done by your health  care provider during your annual well check will depend on your age, overall health, lifestyle risk factors, and family history of disease. Counseling  Your health care provider may ask you questions about your: Alcohol use. Tobacco use. Drug use. Emotional well-being. Home and relationship well-being. Sexual activity. Eating habits. History of falls. Memory and ability to understand (cognition). Work and work Statistician. Screening  You may have the following tests or measurements: Height, weight, and BMI. Blood pressure. Lipid and cholesterol levels. These may be checked every 5 years, or more frequently if you are over 48 years old. Skin check. Lung cancer screening. You may have this screening every year starting at age 63 if you have a 30-pack-year history of smoking and currently smoke or have quit within the past 15 years. Fecal occult blood test (FOBT) of the stool. You may have this test every year starting at age 68. Flexible sigmoidoscopy or colonoscopy. You may have a sigmoidoscopy every 5 years or a colonoscopy every 10 years starting at age 1. Prostate cancer screening. Recommendations will vary depending on your family history and other risks. Hepatitis C blood test. Hepatitis B blood test. Sexually transmitted disease (STD) testing. Diabetes screening. This is done by checking your blood sugar (glucose) after you have not eaten for a while (fasting). You may have this done every 1-3 years. Abdominal aortic aneurysm (AAA) screening. You may need this if you are a current or former smoker. Osteoporosis. You may be screened starting at age 35 if you are at high risk. Talk with your health care provider about your test results, treatment options, and if necessary, the need for more tests. Vaccines  Your health care provider may recommend  certain vaccines, such as: Influenza vaccine. This is recommended every year. Tetanus, diphtheria, and acellular pertussis (Tdap, Td)  vaccine. You may need a Td booster every 10 years. Zoster vaccine. You may need this after age 27. Pneumococcal 13-valent conjugate (PCV13) vaccine. One dose is recommended after age 41. Pneumococcal polysaccharide (PPSV23) vaccine. One dose is recommended after age 36. Talk to your health care provider about which screenings and vaccines you need and how often you need them. This information is not intended to replace advice given to you by your health care provider. Make sure you discuss any questions you have with your health care provider. Document Released: 01/01/2016 Document Revised: 08/24/2016 Document Reviewed: 10/06/2015 Elsevier Interactive Patient Education  2017 [place] Prevention in the Home Falls can cause injuries. They can happen to people of all ages. There are many things you can do to make your home safe and to help prevent falls. What can I do on the outside of my home? Regularly fix the edges of walkways and driveways and fix any cracks. Remove anything that might make you trip as you walk through a door, such as a raised step or threshold. Trim any bushes or trees on the path to your home. Use bright outdoor lighting. Clear any walking paths of anything that might make someone trip, such as rocks or tools. Regularly check to see if handrails are loose or broken. Make sure that both sides of any steps have handrails. Any raised decks and porches should have guardrails on the edges. Have any leaves, snow, or ice cleared regularly. Use sand or salt on walking paths during winter. Clean up any spills in your garage right away. This includes oil or grease spills. What can I do in the bathroom? Use night lights. Install grab bars by the toilet and in the tub and shower. Do not use towel bars as grab bars. Use non-skid mats or decals in the tub or shower. If you need to sit down in the shower, use a plastic, non-slip stool. Keep the floor dry. Clean up any  water that spills on the floor as soon as it happens. Remove soap buildup in the tub or shower regularly. Attach bath mats securely with double-sided non-slip rug tape. Do not have throw rugs and other things on the floor that can make you trip. What can I do in the bedroom? Use night lights. Make sure that you have a light by your bed that is easy to reach. Do not use any sheets or blankets that are too big for your bed. They should not hang down onto the floor. Have a firm chair that has side arms. You can use this for support while you get dressed. Do not have throw rugs and other things on the floor that can make you trip. What can I do in the kitchen? Clean up any spills right away. Avoid walking on wet floors. Keep items that you use a lot in easy-to-reach places. If you need to reach something above you, use a strong step stool that has a grab bar. Keep electrical cords out of the way. Do not use floor polish or wax that makes floors slippery. If you must use wax, use non-skid floor wax. Do not have throw rugs and other things on the floor that can make you trip. What can I do with my stairs? Do not leave any items on the stairs. Make sure that there are handrails on both sides of the  stairs and use them. Fix handrails that are broken or loose. Make sure that handrails are as long as the stairways. Check any carpeting to make sure that it is firmly attached to the stairs. Fix any carpet that is loose or worn. Avoid having throw rugs at the top or bottom of the stairs. If you do have throw rugs, attach them to the floor with carpet tape. Make sure that you have a light switch at the top of the stairs and the bottom of the stairs. If you do not have them, ask someone to add them for you. What else can I do to help prevent falls? Wear shoes that: Do not have high heels. Have rubber bottoms. Are comfortable and fit you well. Are closed at the toe. Do not wear sandals. If you use a  stepladder: Make sure that it is fully opened. Do not climb a closed stepladder. Make sure that both sides of the stepladder are locked into place. Ask someone to hold it for you, if possible. Clearly mark and make sure that you can see: Any grab bars or handrails. First and last steps. Where the edge of each step is. Use tools that help you move around (mobility aids) if they are needed. These include: Canes. Walkers. Scooters. Crutches. Turn on the lights when you go into a dark area. Replace any light bulbs as soon as they burn out. Set up your furniture so you have a clear path. Avoid moving your furniture around. If any of your floors are uneven, fix them. If there are any pets around you, be aware of where they are. Review your medicines with your doctor. Some medicines can make you feel dizzy. This can increase your chance of falling. Ask your doctor what other things that you can do to help prevent falls. This information is not intended to replace advice given to you by your health care provider. Make sure you discuss any questions you have with your health care provider. Document Released: 10/01/2009 Document Revised: 05/12/2016 Document Reviewed: 01/09/2015 Elsevier Interactive Patient Education  2017 [employer].

## 2021-08-24 ENCOUNTER — Encounter: Payer: Self-pay | Admitting: Family Medicine

## 2021-08-24 ENCOUNTER — Other Ambulatory Visit: Payer: Self-pay

## 2021-08-24 ENCOUNTER — Ambulatory Visit (INDEPENDENT_AMBULATORY_CARE_PROVIDER_SITE_OTHER): Payer: Medicare HMO | Admitting: Family Medicine

## 2021-08-24 VITALS — BP 120/69 | HR 76 | Temp 98.2°F | Ht 71.0 in | Wt 264.4 lb

## 2021-08-24 DIAGNOSIS — R609 Edema, unspecified: Secondary | ICD-10-CM | POA: Diagnosis not present

## 2021-08-24 DIAGNOSIS — E790 Hyperuricemia without signs of inflammatory arthritis and tophaceous disease: Secondary | ICD-10-CM

## 2021-08-24 DIAGNOSIS — I152 Hypertension secondary to endocrine disorders: Secondary | ICD-10-CM | POA: Diagnosis not present

## 2021-08-24 DIAGNOSIS — I10 Essential (primary) hypertension: Secondary | ICD-10-CM | POA: Diagnosis not present

## 2021-08-24 DIAGNOSIS — E1159 Type 2 diabetes mellitus with other circulatory complications: Secondary | ICD-10-CM

## 2021-08-24 DIAGNOSIS — J449 Chronic obstructive pulmonary disease, unspecified: Secondary | ICD-10-CM

## 2021-08-24 DIAGNOSIS — Z794 Long term (current) use of insulin: Secondary | ICD-10-CM | POA: Diagnosis not present

## 2021-08-24 DIAGNOSIS — E1165 Type 2 diabetes mellitus with hyperglycemia: Secondary | ICD-10-CM

## 2021-08-24 DIAGNOSIS — E782 Mixed hyperlipidemia: Secondary | ICD-10-CM | POA: Diagnosis not present

## 2021-08-24 DIAGNOSIS — Z1211 Encounter for screening for malignant neoplasm of colon: Secondary | ICD-10-CM | POA: Diagnosis not present

## 2021-08-24 LAB — CMP14+EGFR
ALT: 23 IU/L (ref 0–44)
AST: 20 IU/L (ref 0–40)
Albumin/Globulin Ratio: 1.4 (ref 1.2–2.2)
Albumin: 4.2 g/dL (ref 3.8–4.8)
Alkaline Phosphatase: 52 IU/L (ref 44–121)
BUN/Creatinine Ratio: 20 (ref 10–24)
BUN: 25 mg/dL (ref 8–27)
Bilirubin Total: 0.4 mg/dL (ref 0.0–1.2)
CO2: 23 mmol/L (ref 20–29)
Calcium: 9.4 mg/dL (ref 8.6–10.2)
Chloride: 100 mmol/L (ref 96–106)
Creatinine, Ser: 1.22 mg/dL (ref 0.76–1.27)
Globulin, Total: 2.9 g/dL (ref 1.5–4.5)
Glucose: 110 mg/dL — ABNORMAL HIGH (ref 65–99)
Potassium: 3.6 mmol/L (ref 3.5–5.2)
Sodium: 140 mmol/L (ref 134–144)
Total Protein: 7.1 g/dL (ref 6.0–8.5)
eGFR: 65 mL/min/{1.73_m2} (ref >59)

## 2021-08-24 LAB — CBC WITH DIFFERENTIAL/PLATELET
Basophils Absolute: 0 10*3/uL (ref 0.0–0.2)
Basos: 1 %
EOS (ABSOLUTE): 0.2 10*3/uL (ref 0.0–0.4)
Eos: 3 %
Hematocrit: 40.7 % (ref 37.5–51.0)
Hemoglobin: 13.7 g/dL (ref 13.0–17.7)
Immature Grans (Abs): 0 10*3/uL (ref 0.0–0.1)
Immature Granulocytes: 0 %
Lymphocytes Absolute: 3 10*3/uL (ref 0.7–3.1)
Lymphs: 43 %
MCH: 31.7 pg (ref 26.6–33.0)
MCHC: 33.7 g/dL (ref 31.5–35.7)
MCV: 94 fL (ref 79–97)
Monocytes Absolute: 0.7 10*3/uL (ref 0.1–0.9)
Monocytes: 10 %
Neutrophils Absolute: 3 10*3/uL (ref 1.4–7.0)
Neutrophils: 43 %
Platelets: 242 10*3/uL (ref 150–450)
RBC: 4.32 x10E6/uL (ref 4.14–5.80)
RDW: 12.8 % (ref 11.6–15.4)
WBC: 6.8 10*3/uL (ref 3.4–10.8)

## 2021-08-24 LAB — LIPID PANEL
Chol/HDL Ratio: 4 ratio (ref 0.0–5.0)
Cholesterol, Total: 189 mg/dL (ref 100–199)
HDL: 47 mg/dL (ref >39)
LDL Chol Calc (NIH): 116 mg/dL — ABNORMAL HIGH (ref 0–99)
Triglycerides: 144 mg/dL (ref 0–149)
VLDL Cholesterol Cal: 26 mg/dL (ref 5–40)

## 2021-08-24 LAB — BAYER DCA HB A1C WAIVED: HB A1C (BAYER DCA - WAIVED): 5.6 % (ref 4.8–5.6)

## 2021-08-24 MED ORDER — METFORMIN HCL ER 750 MG PO TB24: 1500.0000 mg | ORAL_TABLET | Freq: Every day | ORAL | 3 refills | Status: DC

## 2021-08-24 MED ORDER — MELOXICAM 15 MG PO TABS: 15.0000 mg | ORAL_TABLET | Freq: Every day | ORAL | 3 refills | Status: DC

## 2021-08-24 MED ORDER — BUDESONIDE-FORMOTEROL FUMARATE 160-4.5 MCG/ACT IN AERO: INHALATION_SPRAY | RESPIRATORY_TRACT | 3 refills | Status: DC

## 2021-08-24 MED ORDER — POTASSIUM CHLORIDE CRYS ER 20 MEQ PO TBCR: EXTENDED_RELEASE_TABLET | ORAL | 3 refills | Status: DC

## 2021-08-24 MED ORDER — LOSARTAN POTASSIUM 100 MG PO TABS: 100.0000 mg | ORAL_TABLET | Freq: Every day | ORAL | 3 refills | Status: DC

## 2021-08-24 MED ORDER — LANTUS SOLOSTAR 100 UNIT/ML ~~LOC~~ SOPN: 15.0000 [IU] | PEN_INJECTOR | SUBCUTANEOUS | 3 refills | Status: DC

## 2021-08-24 MED ORDER — ALLOPURINOL 100 MG PO TABS: ORAL_TABLET | ORAL | 3 refills | Status: DC

## 2021-08-24 MED ORDER — TORSEMIDE 100 MG PO TABS: 100.0000 mg | ORAL_TABLET | Freq: Two times a day (BID) | ORAL | 3 refills | Status: DC

## 2021-08-24 MED ORDER — AMLODIPINE BESYLATE 5 MG PO TABS: 5.0000 mg | ORAL_TABLET | Freq: Every day | ORAL | 0 refills | Status: DC

## 2021-08-24 NOTE — Progress Notes (Signed)
Subjective:  Patient ID: Juan Merritt, male    DOB: May 11, 1951  Age: 70 y.o. MRN: [id number]  CC: Medical Management of Chronic Issues   HPI OZIEL BEITLER presents forFollow-up of diabetes. Patient checks blood sugar at home.   90-110 fasting and 120-140 postprandial. Using Libre to check QID.)Patient started weight loss program when insulin was presscribed. Now down 40 lb. Total and 38 lb since last check up May 25). He states his goal is 200 lb even - another 60 lb.   Patient denies symptoms such as polyuria, polydipsia, excessive hunger, nausea No significant hypoglycemic spells noted.Elenor Legato reports his numbers to go below threshold about 20 times in 3 mos, but he denies ymptoms Medications reviewed. Pt reports taking them regularly without complication/adverse reaction being reported today.   presents for  follow-up of hypertension. Patient has no history of headache chest pain or shortness of breath or recent cough. Patient also denies symptoms of TIA such as focal numbness or weakness. Patient denies side effects from medication. States taking it regularly.   in for follow-up of elevated cholesterol. Doing well without complaints on current medication. Denies side effects of statin including myalgia and arthralgia and nausea. Currently no chest pain, shortness of breath or other cardiovascular related symptoms noted.  Breathing is stable. Overall feeling more energetic with the weight loss. Swelling better as well. Still using torsemide. Kidney functions to be drawn.  No ecent gout attak. Due for Uric acid level  History Dion has a past medical history of Arthritis, COPD (chronic obstructive pulmonary disease) ([place]), Diabetes mellitus without complication ([place]), Gallstones, Gout, Hypertension, Obesity, and Pneumonia.   He has a past surgical history that includes Cholecystectomy and Tonsillectomy.   His family history includes Asthma in his mother; Diabetes in his father;  Esophageal cancer in his paternal uncle; Heart attack in an other family member; Heart attack (age of onset: 81) in his father; Leukemia in his brother; Lung disease in an other family member.He reports that he quit smoking about 4 years ago. His smoking use included cigarettes and e-cigarettes. He has a 10.00 pack-year smoking history. His smokeless tobacco use includes chew. He reports current alcohol use. He reports that he does not use drugs.  Current Outpatient Medications on File Prior to Visit  Medication Sig Dispense Refill   albuterol (VENTOLIN HFA) 108 (90 Base) MCG/ACT inhaler Inhale 2 puffs into the lungs 2 (two) times daily. 18 g 5   aspirin EC 81 MG tablet Take 81 mg by mouth daily. Swallow whole.     Continuous Blood Gluc Sensor (FREESTYLE LIBRE 2 SENSOR) MISC USE TO TEST BLOOD SUGAR CONTINUOUSLY. Dx. E11.65 SEND TO [facility] FOR MEDICARE COVERAGE, DO NOT SEND TO LOCAL PHARMACY 2 each 0   Insulin Pen Needle 31G X 8 MM MISC Use to inject insulin daily. DX: E11.59 100 each 12   ipratropium-albuterol (DUONEB) 0.5-2.5 (3) MG/3ML SOLN Take 3 mLs by nebulization every 4 (four) hours as needed. (Patient taking differently: Take 3 mLs by nebulization every 4 (four) hours as needed (SOB).) 360 mL 3   Microlet Lancets MISC Use to test blood sugar 3 times daily as directed.; DX:e.11.65 100 each 12   naproxen sodium (ALEVE) 220 MG tablet Take 220 mg by mouth daily as needed (pain).     No current facility-administered medications on file prior to visit.    ROS Review of Systems  Constitutional:  Negative for fever.  Respiratory:  Negative for shortness of  breath.   Cardiovascular:  Negative for chest pain.  Musculoskeletal:  Negative for arthralgias.  Skin:  Negative for rash.   Objective:  BP 120/69   Pulse 76   Temp 98.2 F (36.8 C)   Ht _0  (1.803 m)   Wt 264 lb 6.4 oz (119.9 kg)   SpO2 96%   BMI 36.88 kg/m   BP Readings from Last 3 Encounters:  08/24/21 120/69   06/16/21 106/65  05/20/21 115/74    Wt Readings from Last 3 Encounters:  08/24/21 264 lb 6.4 oz (119.9 kg)  08/18/21 260 lb (117.9 kg)  06/16/21 281 lb 12.8 oz (127.8 kg)     Physical Exam Vitals reviewed.  Constitutional:      Appearance: He is well-developed.  HENT:     Head: Normocephalic and atraumatic.     Right Ear: External ear normal.     Left Ear: External ear normal.     Mouth/Throat:     Pharynx: No oropharyngeal exudate or posterior oropharyngeal erythema.  Eyes:     Pupils: Pupils are equal, round, and reactive to light.  Cardiovascular:     Rate and Rhythm: Normal rate and regular rhythm.     Heart sounds: No murmur heard. Pulmonary:     Effort: No respiratory distress.     Breath sounds: Normal breath sounds.  Musculoskeletal:     Cervical back: Normal range of motion and neck supple.  Neurological:     Mental Status: He is alert and oriented to person, place, and time.      Assessment & Plan:   Gerasimos was seen today for medical management of chronic issues.  Diagnoses and all orders for this visit:  Type 2 diabetes mellitus with hyperglycemia, with long-term current use of insulin ([place]) -     Bayer DCA Hb A1c Waived -     CBC with Differential/Platelet  Mixed hyperlipidemia -     Lipid panel  Essential hypertension, benign -     CMP14+EGFR -     amLODipine (NORVASC) 5 MG tablet; Take 1 tablet (5 mg total) by mouth daily. -     losartan (COZAAR) 100 MG tablet; Take 1 tablet (100 mg total) by mouth daily. -     potassium chloride SA (KLOR-CON M20) 20 MEQ tablet; TAKE 1 TABLET (20 MEQ TOTAL) DAILY BY MOUTH.  Hyperuricemia -     allopurinol (ZYLOPRIM) 100 MG tablet; TAKE 1 TABLET (100 MG TOTAL) BY MOUTH DAILY. TO PREVENT GOUT  Hypertension associated with diabetes ([place]) -     losartan (COZAAR) 100 MG tablet; Take 1 tablet (100 mg total) by mouth daily.  COPD mixed type ([facility]) -     budesonide-formoterol (SYMBICORT) 160-4.5 MCG/ACT inhaler;  INHALE 2 PUFFS INTO THE LUNGS IN THE MORNING AND AT BEDTIME. TAKE 2 PUFFS BY MOUTH TWICE A DAY  Edema, unspecified type -     torsemide (DEMADEX) 100 MG tablet; Take 1 tablet (100 mg total) by mouth 2 (two) times daily. On arising and at lunchtime  Colon cancer screening -     Cologuard  Other orders -     metFORMIN (GLUCOPHAGE-XR) 750 MG 24 hr tablet; Take 2 tablets (1,500 mg total) by mouth daily with breakfast. -     meloxicam (MOBIC) 15 MG tablet; Take 1 tablet (15 mg total) by mouth daily. -     insulin glargine (LANTUS SOLOSTAR) 100 UNIT/ML Solostar Pen; Inject 15 Units into the skin every morning.  I have discontinued Romello Hoehn. Rathe's insulin glargine, ONE TOUCH ULTRA 2, and OneTouch Ultra. I have changed his Symbicort to budesonide-formoterol. I have also changed his amLODipine and losartan. Additionally, I am having him start on Lantus SoloStar. Lastly, I am having him maintain his ipratropium-albuterol, naproxen sodium, aspirin EC, albuterol, Insulin Pen Needle, FreeStyle Libre 2 Sensor, Microlet Lancets, allopurinol, potassium chloride SA, torsemide, metFORMIN, and meloxicam.  Meds ordered this encounter  Medications   allopurinol (ZYLOPRIM) 100 MG tablet    Sig: TAKE 1 TABLET (100 MG TOTAL) BY MOUTH DAILY. TO PREVENT GOUT    Dispense:  90 tablet    Refill:  3   amLODipine (NORVASC) 5 MG tablet    Sig: Take 1 tablet (5 mg total) by mouth daily.    Dispense:  90 tablet    Refill:  0   losartan (COZAAR) 100 MG tablet    Sig: Take 1 tablet (100 mg total) by mouth daily.    Dispense:  90 tablet    Refill:  3   potassium chloride SA (KLOR-CON M20) 20 MEQ tablet    Sig: TAKE 1 TABLET (20 MEQ TOTAL) DAILY BY MOUTH.    Dispense:  90 tablet    Refill:  3   budesonide-formoterol (SYMBICORT) 160-4.5 MCG/ACT inhaler    Sig: INHALE 2 PUFFS INTO THE LUNGS IN THE MORNING AND AT BEDTIME. TAKE 2 PUFFS BY MOUTH TWICE A DAY    Dispense:  30.6 each    Refill:  3   torsemide  (DEMADEX) 100 MG tablet    Sig: Take 1 tablet (100 mg total) by mouth 2 (two) times daily. On arising and at lunchtime    Dispense:  180 tablet    Refill:  3   metFORMIN (GLUCOPHAGE-XR) 750 MG 24 hr tablet    Sig: Take 2 tablets (1,500 mg total) by mouth daily with breakfast.    Dispense:  180 tablet    Refill:  3   meloxicam (MOBIC) 15 MG tablet    Sig: Take 1 tablet (15 mg total) by mouth daily.    Dispense:  90 tablet    Refill:  3    DX Code Needed  .   insulin glargine (LANTUS SOLOSTAR) 100 UNIT/ML Solostar Pen    Sig: Inject 15 Units into the skin every morning.    Dispense:  15 mL    Refill:  3     Follow-up: Return in about 3 months (around 11/23/2021).  [name], M.D.

## 2021-08-26 ENCOUNTER — Other Ambulatory Visit: Payer: Self-pay | Admitting: Family Medicine

## 2021-08-26 MED ORDER — ROSUVASTATIN CALCIUM 5 MG PO TABS: 5.0000 mg | ORAL_TABLET | Freq: Every day | ORAL | 3 refills | Status: DC

## 2021-08-30 ENCOUNTER — Telehealth: Payer: Self-pay | Admitting: Family Medicine

## 2021-08-30 DIAGNOSIS — E1165 Type 2 diabetes mellitus with hyperglycemia: Secondary | ICD-10-CM | POA: Diagnosis not present

## 2021-08-30 DIAGNOSIS — J449 Chronic obstructive pulmonary disease, unspecified: Secondary | ICD-10-CM

## 2021-08-30 NOTE — Telephone Encounter (Signed)
  Prescription Request  08/30/2021  Is this a "Controlled Substance" medicine? no Have you seen your PCP in the last 2 weeks? no If YES, route message to pool  -  If NO, patient needs to be scheduled for appointment.  What is the name of the medication or equipment? Pt nebulizer that he has had for 12 years stopped working  Have you contacted your pharmacy to request a refill?   Which pharmacy would you like this sent to? [facility]   Patient notified that their request is being sent to the clinical staff for review and that they should receive a response within 2 business days.

## 2021-09-03 LAB — COLOGUARD

## 2021-09-08 ENCOUNTER — Other Ambulatory Visit: Payer: Self-pay | Admitting: Family Medicine

## 2021-09-13 ENCOUNTER — Telehealth: Payer: Self-pay | Admitting: Family Medicine

## 2021-09-15 ENCOUNTER — Other Ambulatory Visit: Payer: Self-pay | Admitting: Family Medicine

## 2021-09-15 DIAGNOSIS — Z1211 Encounter for screening for malignant neoplasm of colon: Secondary | ICD-10-CM | POA: Diagnosis not present

## 2021-09-15 MED ORDER — INSULIN GLARGINE 100 UNIT/ML SOLOSTAR PEN: 20.0000 [IU] | PEN_INJECTOR | Freq: Every day | SUBCUTANEOUS | 3 refills | Status: DC

## 2021-09-15 NOTE — Telephone Encounter (Signed)
Done. Thanks, WS 

## 2021-09-17 ENCOUNTER — Other Ambulatory Visit: Payer: Self-pay | Admitting: Family Medicine

## 2021-09-17 NOTE — Telephone Encounter (Signed)
Pts wife called wanting to know why Dr Livia Snellen has changed pt's Insulin brand? She said Tyler Aas is too expensive. Explained to wife that it looks like Lantus Insulin was sent to pharmacy on 9/28. Wife says that's not what he was previously taking either and just wants to know why he is not able to take the Insulin brand he was previously taking?  Please advise and call wife Juan Merritt).

## 2021-09-17 NOTE — Telephone Encounter (Signed)
PATIENT HAS BASAGLAR AND DR Livia Snellen APPROVES HIM STAYING ON THAT ONE

## 2021-09-17 NOTE — Telephone Encounter (Signed)
Lanuts goes by other names, but it is what his record shows that he is taking. (Or was)

## 2021-09-20 DIAGNOSIS — E1165 Type 2 diabetes mellitus with hyperglycemia: Secondary | ICD-10-CM | POA: Diagnosis not present

## 2021-09-21 LAB — COLOGUARD: Cologuard: POSITIVE — AB

## 2021-09-28 ENCOUNTER — Ambulatory Visit (INDEPENDENT_AMBULATORY_CARE_PROVIDER_SITE_OTHER): Payer: Medicare HMO

## 2021-09-28 ENCOUNTER — Other Ambulatory Visit: Payer: Self-pay

## 2021-09-28 DIAGNOSIS — Z23 Encounter for immunization: Secondary | ICD-10-CM

## 2021-10-01 ENCOUNTER — Other Ambulatory Visit: Payer: Self-pay | Admitting: Family Medicine

## 2021-10-01 DIAGNOSIS — R195 Other fecal abnormalities: Secondary | ICD-10-CM | POA: Insufficient documentation

## 2021-10-04 ENCOUNTER — Telehealth: Payer: Self-pay | Admitting: Family Medicine

## 2021-10-04 NOTE — Telephone Encounter (Signed)
Pt needs a colonoscopy. He has never had one before. Please call back and advise.

## 2021-10-04 NOTE — Telephone Encounter (Signed)
Spoke with patients wife he has referral in. Wants to see where they are at in process of getting scheduled with GI states an automated thing called them and sent them to [place]. Please call patient and advise them of referral.

## 2021-10-07 ENCOUNTER — Encounter: Payer: Self-pay | Admitting: Gastroenterology

## 2021-10-08 LAB — HM DIABETES EYE EXAM

## 2021-10-17 ENCOUNTER — Other Ambulatory Visit: Payer: Self-pay | Admitting: Family Medicine

## 2021-10-21 DIAGNOSIS — E1165 Type 2 diabetes mellitus with hyperglycemia: Secondary | ICD-10-CM | POA: Diagnosis not present

## 2021-11-09 ENCOUNTER — Ambulatory Visit (AMBULATORY_SURGERY_CENTER): Payer: Medicare HMO | Admitting: *Deleted

## 2021-11-09 ENCOUNTER — Encounter: Payer: Self-pay | Admitting: Gastroenterology

## 2021-11-09 VITALS — Ht 71.0 in | Wt 250.0 lb

## 2021-11-09 DIAGNOSIS — R195 Other fecal abnormalities: Secondary | ICD-10-CM

## 2021-11-09 MED ORDER — PEG 3350-KCL-NA BICARB-NACL 420 G PO SOLR: 4000.0000 mL | Freq: Once | ORAL | 0 refills | Status: AC

## 2021-11-09 NOTE — Progress Notes (Signed)
No egg or soy allergy known to patient  No issues known to pt with past sedation with any surgeries or procedures Patient denies ever being told they had issues or difficulty with intubation  No FH of Malignant Hyperthermia Pt is not on diet pills Pt is not on  home 02  Pt is not on blood thinners  Pt denies issues with constipation  No A fib or A flutter  Pt is fully vaccinated  for Covid   NO PA's for preps discussed with pt In PV today  Discussed with pt there will be an out-of-pocket cost for prep and that varies from $0 to 70 +  dollars - pt verbalized understanding   Due to the COVID-19 pandemic we are asking patients to follow certain guidelines in PV and the [place]   Pt aware of COVID protocols and [facility] guidelines

## 2021-11-21 DIAGNOSIS — E1165 Type 2 diabetes mellitus with hyperglycemia: Secondary | ICD-10-CM | POA: Diagnosis not present

## 2021-11-23 ENCOUNTER — Other Ambulatory Visit: Payer: Self-pay

## 2021-11-23 ENCOUNTER — Other Ambulatory Visit: Payer: Medicare HMO

## 2021-11-23 ENCOUNTER — Ambulatory Visit: Payer: Medicare HMO | Admitting: Family Medicine

## 2021-11-23 DIAGNOSIS — Z794 Long term (current) use of insulin: Secondary | ICD-10-CM | POA: Diagnosis not present

## 2021-11-23 DIAGNOSIS — E782 Mixed hyperlipidemia: Secondary | ICD-10-CM | POA: Diagnosis not present

## 2021-11-23 DIAGNOSIS — E1165 Type 2 diabetes mellitus with hyperglycemia: Secondary | ICD-10-CM | POA: Diagnosis not present

## 2021-11-23 DIAGNOSIS — I10 Essential (primary) hypertension: Secondary | ICD-10-CM

## 2021-11-23 LAB — CBC WITH DIFFERENTIAL/PLATELET
Basophils Absolute: 0 10*3/uL (ref 0.0–0.2)
Basos: 1 %
EOS (ABSOLUTE): 0.4 10*3/uL (ref 0.0–0.4)
Eos: 5 %
Hematocrit: 41.4 % (ref 37.5–51.0)
Hemoglobin: 13.8 g/dL (ref 13.0–17.7)
Immature Grans (Abs): 0 10*3/uL (ref 0.0–0.1)
Immature Granulocytes: 0 %
Lymphocytes Absolute: 3.1 10*3/uL (ref 0.7–3.1)
Lymphs: 43 %
MCH: 30.7 pg (ref 26.6–33.0)
MCHC: 33.3 g/dL (ref 31.5–35.7)
MCV: 92 fL (ref 79–97)
Monocytes Absolute: 0.7 10*3/uL (ref 0.1–0.9)
Monocytes: 10 %
Neutrophils Absolute: 2.9 10*3/uL (ref 1.4–7.0)
Neutrophils: 41 %
Platelets: 250 10*3/uL (ref 150–450)
RBC: 4.5 x10E6/uL (ref 4.14–5.80)
RDW: 12.8 % (ref 11.6–15.4)
WBC: 7.1 10*3/uL (ref 3.4–10.8)

## 2021-11-23 LAB — BAYER DCA HB A1C WAIVED: HB A1C (BAYER DCA - WAIVED): 5.7 % — ABNORMAL HIGH (ref 4.8–5.6)

## 2021-11-23 LAB — CMP14+EGFR
ALT: 14 IU/L (ref 0–44)
AST: 12 IU/L (ref 0–40)
Albumin/Globulin Ratio: 1.6 (ref 1.2–2.2)
Albumin: 4.4 g/dL (ref 3.8–4.8)
Alkaline Phosphatase: 53 IU/L (ref 44–121)
BUN/Creatinine Ratio: 25 — ABNORMAL HIGH (ref 10–24)
BUN: 30 mg/dL — ABNORMAL HIGH (ref 8–27)
Bilirubin Total: 0.4 mg/dL (ref 0.0–1.2)
CO2: 25 mmol/L (ref 20–29)
Calcium: 9.4 mg/dL (ref 8.6–10.2)
Chloride: 101 mmol/L (ref 96–106)
Creatinine, Ser: 1.18 mg/dL (ref 0.76–1.27)
Globulin, Total: 2.8 g/dL (ref 1.5–4.5)
Glucose: 90 mg/dL (ref 70–99)
Potassium: 4 mmol/L (ref 3.5–5.2)
Sodium: 140 mmol/L (ref 134–144)
Total Protein: 7.2 g/dL (ref 6.0–8.5)
eGFR: 67 mL/min/{1.73_m2} (ref >59)

## 2021-11-24 ENCOUNTER — Ambulatory Visit (INDEPENDENT_AMBULATORY_CARE_PROVIDER_SITE_OTHER): Payer: Medicare HMO | Admitting: Family Medicine

## 2021-11-24 ENCOUNTER — Encounter: Payer: Self-pay | Admitting: Family Medicine

## 2021-11-24 ENCOUNTER — Other Ambulatory Visit: Payer: Self-pay

## 2021-11-24 VITALS — BP 99/61 | HR 86 | Temp 98.0°F | Ht 71.0 in | Wt 263.2 lb

## 2021-11-24 DIAGNOSIS — E1165 Type 2 diabetes mellitus with hyperglycemia: Secondary | ICD-10-CM | POA: Diagnosis not present

## 2021-11-24 DIAGNOSIS — R609 Edema, unspecified: Secondary | ICD-10-CM | POA: Diagnosis not present

## 2021-11-24 DIAGNOSIS — I10 Essential (primary) hypertension: Secondary | ICD-10-CM

## 2021-11-24 DIAGNOSIS — Z794 Long term (current) use of insulin: Secondary | ICD-10-CM | POA: Diagnosis not present

## 2021-11-24 MED ORDER — TORSEMIDE 100 MG PO TABS
100.0000 mg | ORAL_TABLET | Freq: Every day | ORAL | 3 refills | Status: DC
Start: 2021-11-24 — End: 2021-12-21

## 2021-11-24 MED ORDER — AMLODIPINE BESYLATE 5 MG PO TABS: 5.0000 mg | ORAL_TABLET | Freq: Every day | ORAL | 3 refills | Status: DC

## 2021-11-24 NOTE — Patient Instructions (Addendum)
If your fasting glucose starts going over 120 and the evening glucose over 160 repeatedly, you should go back on insulin 10 units a day.

## 2021-11-24 NOTE — Progress Notes (Signed)
Subjective:  Patient ID: Juan Merritt,  male    DOB: [birth year]  Age: 70 y.o.    CC: Medical Management of Chronic Issues   HPI Jermy Couper Iglesias presents for  follow-up of hypertension. Patient has no history of headache chest pain or shortness of breath or recent cough. Patient also denies symptoms of TIA such as numbness weakness lateralizing. Patient denies side effects from medication. States taking it regularly.  Not having any significant swelling.  He is like to cut back on the Demadex.  He is working diligently on his diet and losing weight.  See physical exam below.  Patient also  in for follow-up of elevated cholesterol. Doing well without complaints on current medication. Having side effects  including myalgia and arthralgia. No nausea. Also in today for liver function testing. Currently no chest pain, shortness of breath or other cardiovascular related symptoms noted.  Follow-up of diabetes. Patient does check blood sugar at home. Readings run between 90-120. Averages taken from [employer] device.  Patient denies symptoms such as excessive hunger or urinary frequency, excessive hunger, nausea No significant hypoglycemic spells noted. Medications reviewed. Pt reports taking them regularly. Pt. denies complication/adverse reaction today.    History Daegan has a past medical history of Arthritis, COPD (chronic obstructive pulmonary disease) ([place]), Diabetes mellitus without complication ([place]), Gallstones, Gout, Hyperlipidemia, Hypertension, Obesity, and Pneumonia.   He has a past surgical history that includes Cholecystectomy and Tonsillectomy.   His family history includes Asthma in his mother; Diabetes in his father; Esophageal cancer in his paternal uncle; Heart attack in an other family member; Heart attack (age of onset: 66) in his father; Leukemia in his brother; Lung disease in an other family member.He reports that he quit smoking about 4 years ago. His smoking use included  cigarettes and e-cigarettes. He has a 10.00 pack-year smoking history. His smokeless tobacco use includes chew. He reports current alcohol use. He reports that he does not use drugs.  Current Outpatient Medications on File Prior to Visit  Medication Sig Dispense Refill   albuterol (VENTOLIN HFA) 108 (90 Base) MCG/ACT inhaler Inhale 2 puffs into the lungs 2 (two) times daily. 18 g 5   allopurinol (ZYLOPRIM) 100 MG tablet TAKE 1 TABLET (100 MG TOTAL) BY MOUTH DAILY. TO PREVENT GOUT 90 tablet 3   aspirin EC 81 MG tablet Take 81 mg by mouth daily. Swallow whole.     Blood Glucose Monitoring Suppl (ONETOUCH VERIO FLEX SYSTEM) w/Device KIT TEST BLOOD SUGAR 3 TIMES DAILY AS DIRECTED. DX: E11.65 1 kit 0   budesonide-formoterol (SYMBICORT) 160-4.5 MCG/ACT inhaler INHALE 2 PUFFS INTO THE LUNGS IN THE MORNING AND AT BEDTIME. TAKE 2 PUFFS BY MOUTH TWICE A DAY 30.6 each 3   Continuous Blood Gluc Sensor (FREESTYLE LIBRE 2 SENSOR) MISC USE TO TEST BLOOD SUGAR CONTINUOUSLY. Dx. E11.65 SEND TO [facility] FOR MEDICARE COVERAGE, DO NOT SEND TO LOCAL PHARMACY 2 each 0   Insulin Pen Needle 31G X 8 MM MISC Use to inject insulin daily. DX: E11.59 100 each 12   ipratropium-albuterol (DUONEB) 0.5-2.5 (3) MG/3ML SOLN Take 3 mLs by nebulization every 4 (four) hours as needed. (Patient taking differently: Take 3 mLs by nebulization every 4 (four) hours as needed (SOB).) 360 mL 3   losartan (COZAAR) 100 MG tablet Take 1 tablet (100 mg total) by mouth daily. 90 tablet 3   meloxicam (MOBIC) 15 MG tablet Take 1 tablet (15 mg total) by mouth daily. 90 tablet 3  metFORMIN (GLUCOPHAGE-XR) 750 MG 24 hr tablet Take 2 tablets (1,500 mg total) by mouth daily with breakfast. 180 tablet 3   Microlet Lancets MISC Use to test blood sugar 3 times daily as directed.; DX:e.11.65 100 each 12   naproxen sodium (ALEVE) 220 MG tablet Take 220 mg by mouth daily as needed (pain).     ONETOUCH VERIO test strip 3 (three) times daily.      potassium chloride SA (KLOR-CON M20) 20 MEQ tablet TAKE 1 TABLET (20 MEQ TOTAL) DAILY BY MOUTH. 90 tablet 3   rosuvastatin (CRESTOR) 5 MG tablet Take 1 tablet (5 mg total) by mouth daily. For cholesterol 90 tablet 3   No current facility-administered medications on file prior to visit.    ROS Review of Systems  Constitutional:  Negative for fever.  Respiratory:  Negative for shortness of breath.   Cardiovascular:  Negative for chest pain.  Musculoskeletal:  Negative for arthralgias.  Skin:  Negative for rash.   Objective:  BP 99/61   Pulse 86   Temp 98 F (36.7 C)   Ht _0  (1.803 m)   Wt 263 lb 3.2 oz (119.4 kg)   SpO2 95%   BMI 36.71 kg/m   BP Readings from Last 3 Encounters:  11/24/21 99/61  08/24/21 120/69  06/16/21 106/65    Wt Readings from Last 3 Encounters:  11/24/21 263 lb 3.2 oz (119.4 kg)  11/09/21 250 lb (113.4 kg)  08/24/21 264 lb 6.4 oz (119.9 kg)     Physical Exam Constitutional:      General: He is not in acute distress.    Appearance: He is well-developed.  HENT:     Head: Normocephalic and atraumatic.     Right Ear: External ear normal.     Left Ear: External ear normal.     Nose: Nose normal.  Eyes:     Conjunctiva/sclera: Conjunctivae normal.     Pupils: Pupils are equal, round, and reactive to light.  Cardiovascular:     Rate and Rhythm: Normal rate and regular rhythm.     Heart sounds: Normal heart sounds. No murmur heard. Pulmonary:     Effort: Pulmonary effort is normal. No respiratory distress.     Breath sounds: Normal breath sounds. No wheezing or rales.  Abdominal:     Palpations: Abdomen is soft.     Tenderness: There is no abdominal tenderness.  Musculoskeletal:        General: Normal range of motion.     Cervical back: Normal range of motion and neck supple.  Skin:    General: Skin is warm and dry.  Neurological:     Mental Status: He is alert and oriented to person, place, and time.     Deep Tendon Reflexes: Reflexes  are normal and symmetric.  Psychiatric:        Behavior: Behavior normal.        Thought Content: Thought content normal.        Judgment: Judgment normal.    If your fasting glucose starts going over 120 and the evening glucose over 160 repeatedly, you should go back on insulin 10 units a day.   Assessment & Plan:   Damoney was seen today for medical management of chronic issues.  Diagnoses and all orders for this visit:  Type 2 diabetes mellitus with hyperglycemia, with long-term current use of insulin (HCC)  Essential hypertension, benign -     amLODipine (NORVASC) 5 MG tablet; Take 1 tablet (5  mg total) by mouth daily.  Edema, unspecified type -     torsemide (DEMADEX) 100 MG tablet; Take 1 tablet (100 mg total) by mouth daily. On arising and at lunchtime  I have discontinued Johneric Mcfadden. [employer]. I have also changed his torsemide. Additionally, I am having him maintain his ipratropium-albuterol, naproxen sodium, aspirin EC, albuterol, Insulin Pen Needle, FreeStyle Libre 2 Sensor, Microlet Lancets, allopurinol, losartan, potassium chloride SA, budesonide-formoterol, metFORMIN, meloxicam, rosuvastatin, OneTouch Verio Flex System, [employer], and amLODipine.  Meds ordered this encounter  Medications   amLODipine (NORVASC) 5 MG tablet    Sig: Take 1 tablet (5 mg total) by mouth daily.    Dispense:  90 tablet    Refill:  3   torsemide (DEMADEX) 100 MG tablet    Sig: Take 1 tablet (100 mg total) by mouth daily. On arising and at lunchtime    Dispense:  90 tablet    Refill:  3     Follow-up: Return in about 3 months (around 02/22/2022).  [name], M.D.

## 2021-12-01 ENCOUNTER — Encounter: Payer: Self-pay | Admitting: Gastroenterology

## 2021-12-01 ENCOUNTER — Ambulatory Visit (AMBULATORY_SURGERY_CENTER): Payer: Medicare HMO | Admitting: Gastroenterology

## 2021-12-01 VITALS — BP 107/65 | HR 72 | Temp 98.1°F | Resp 15 | Ht 71.0 in | Wt 250.0 lb

## 2021-12-01 DIAGNOSIS — D125 Benign neoplasm of sigmoid colon: Secondary | ICD-10-CM | POA: Diagnosis not present

## 2021-12-01 DIAGNOSIS — R195 Other fecal abnormalities: Secondary | ICD-10-CM

## 2021-12-01 DIAGNOSIS — D122 Benign neoplasm of ascending colon: Secondary | ICD-10-CM

## 2021-12-01 DIAGNOSIS — D123 Benign neoplasm of transverse colon: Secondary | ICD-10-CM | POA: Diagnosis not present

## 2021-12-01 HISTORY — PX: COLONOSCOPY: SHX174

## 2021-12-01 MED ORDER — SODIUM CHLORIDE 0.9 % IV SOLN: 500.0000 mL | Freq: Once | INTRAVENOUS | Status: DC

## 2021-12-01 NOTE — Op Note (Signed)
[facility] Patient Name: Juan Merritt Procedure Date: 12/01/2021 1:22 PM MRN: [id number] Endoscopist: Mallie Mussel L. Loletha Carrow , MD Age: 70 Referring MD:  Date of Birth: 1951/02/18 Gender: Male Account #: [id number] Procedure:                Colonoscopy Indications:              Positive Cologuard test (which was patient's first                            CRC screening test) Medicines:                Monitored Anesthesia Care Procedure:                Pre-Anesthesia Assessment:                           - Prior to the procedure, a History and Physical                            was performed, and patient medications and                            allergies were reviewed. The patient's tolerance of                            previous anesthesia was also reviewed. The risks                            and benefits of the procedure and the sedation                            options and risks were discussed with the patient.                            All questions were answered, and informed consent                            was obtained. Prior Anticoagulants: The patient has                            taken no previous anticoagulant or antiplatelet                            agents. ASA Grade Assessment: III - A patient with                            severe systemic disease. After reviewing the risks                            and benefits, the patient was deemed in                            satisfactory condition to undergo the procedure.  After obtaining informed consent, the colonoscope                            was passed under direct vision. Throughout the                            procedure, the patient's blood pressure, pulse, and                            oxygen saturations were monitored continuously. The                            CF [id number] #[id number] was introduced through the anus                            and advanced to the the cecum,  identified by                            appendiceal orifice and ileocecal valve. The                            colonoscopy was somewhat difficult due to a                            redundant colon, significant looping and a tortuous                            colon. Successful completion of the procedure was                            aided by using manual pressure and straightening                            and shortening the scope to obtain bowel loop                            reduction. The patient tolerated the procedure                            well. The quality of the bowel preparation was good                            after lavage. The ileocecal valve, appendiceal                            orifice, and rectum were photographed. Scope In: 1:38:09 PM Scope Out: 2:01:59 PM Scope Withdrawal Time: 0 hours 17 minutes 1 second  Total Procedure Duration: 0 hours 23 minutes 50 seconds  Findings:                 The perianal and digital rectal examinations were                            normal.  Multiple diverticula were found from sigmoid to                            hepatic flexure.                           Two sessile polyps were found in the transverse                            colon and ascending colon. The polyps were 4 to 8                            mm in size. These polyps were removed with a cold                            snare. Resection and retrieval were complete.                           A 5 mm polyp was found in the sigmoid colon. The                            polyp was flat. The polyp was removed with a cold                            snare. Resection and retrieval were complete.                           Internal hemorrhoids were found.                           The exam was otherwise without abnormality on                            direct and retroflexion views. Complications:            No immediate complications. Estimated  Blood Loss:     Estimated blood loss was minimal. Impression:               - Diverticulosis from sigmoid to hepatic flexure.                           - Two 4 to 8 mm polyps in the transverse colon and                            in the ascending colon, removed with a cold snare.                            Resected and retrieved.                           - One 5 mm polyp in the sigmoid colon, removed with                            a cold snare. Resected  and retrieved.                           - Internal hemorrhoids.                           - The examination was otherwise normal on direct                            and retroflexion views. Recommendation:           - Patient has a contact number available for                            emergencies. The signs and symptoms of potential                            delayed complications were discussed with the                            patient. Return to normal activities tomorrow.                            Written discharge instructions were provided to the                            patient.                           - Resume previous diet.                           - Continue present medications.                           - Await pathology results.                           - Repeat colonoscopy is recommended for                            surveillance. The colonoscopy date will be                            determined after pathology results from today's                            exam become available for review. Tasheema Perrone L. Loletha Carrow, MD 12/01/2021 2:14:29 PM This report has been signed electronically.

## 2021-12-01 NOTE — Progress Notes (Signed)
Pt's states no medical or surgical changes since previsit or office visit. 

## 2021-12-01 NOTE — Progress Notes (Signed)
Called to room to assist during endoscopic procedure.  Patient ID and intended procedure confirmed with present staff. Received instructions for my participation in the procedure from the performing physician.  

## 2021-12-01 NOTE — Progress Notes (Signed)
C.W. vital signs. 

## 2021-12-01 NOTE — Progress Notes (Signed)
History and Physical:  This patient presents for endoscopic testing for: Encounter Diagnosis  Name Primary?   Positive colorectal cancer screening using Cologuard test Yes   Patient denies chronic abdominal pain, rectal bleeding, constipation or diarrhea. No prior colonoscopy or screening until recent cologuard test  ROS: Patient denies chest pain or cough   Past Medical History: Past Medical History:  Diagnosis Date   Arthritis    COPD (chronic obstructive pulmonary disease) ([place])    not on home o2   Diabetes mellitus without complication (HCC)    Gallstones    Gout    Hyperlipidemia    Hypertension    Obesity    Pneumonia      Past Surgical History: Past Surgical History:  Procedure Laterality Date   CHOLECYSTECTOMY     TONSILLECTOMY      Allergies: No Known Allergies  Outpatient Meds: Current Outpatient Medications  Medication Sig Dispense Refill   albuterol (VENTOLIN HFA) 108 (90 Base) MCG/ACT inhaler Inhale 2 puffs into the lungs 2 (two) times daily. 18 g 5   allopurinol (ZYLOPRIM) 100 MG tablet TAKE 1 TABLET (100 MG TOTAL) BY MOUTH DAILY. TO PREVENT GOUT 90 tablet 3   amLODipine (NORVASC) 5 MG tablet Take 1 tablet (5 mg total) by mouth daily. 90 tablet 3   aspirin EC 81 MG tablet Take 81 mg by mouth daily. Swallow whole.     Blood Glucose Monitoring Suppl (ONETOUCH VERIO FLEX SYSTEM) w/Device KIT TEST BLOOD SUGAR 3 TIMES DAILY AS DIRECTED. DX: E11.65 1 kit 0   budesonide-formoterol (SYMBICORT) 160-4.5 MCG/ACT inhaler INHALE 2 PUFFS INTO THE LUNGS IN THE MORNING AND AT BEDTIME. TAKE 2 PUFFS BY MOUTH TWICE A DAY 30.6 each 3   Continuous Blood Gluc Sensor (FREESTYLE LIBRE 2 SENSOR) MISC USE TO TEST BLOOD SUGAR CONTINUOUSLY. Dx. E11.65 SEND TO [facility] FOR MEDICARE COVERAGE, DO NOT SEND TO LOCAL PHARMACY 2 each 0   Insulin Pen Needle 31G X 8 MM MISC Use to inject insulin daily. DX: E11.59 100 each 12   ipratropium-albuterol (DUONEB) 0.5-2.5 (3) MG/3ML SOLN  Take 3 mLs by nebulization every 4 (four) hours as needed. (Patient taking differently: Take 3 mLs by nebulization every 4 (four) hours as needed (SOB).) 360 mL 3   losartan (COZAAR) 100 MG tablet Take 1 tablet (100 mg total) by mouth daily. 90 tablet 3   meloxicam (MOBIC) 15 MG tablet Take 1 tablet (15 mg total) by mouth daily. 90 tablet 3   metFORMIN (GLUCOPHAGE-XR) 750 MG 24 hr tablet Take 2 tablets (1,500 mg total) by mouth daily with breakfast. 180 tablet 3   Microlet Lancets MISC Use to test blood sugar 3 times daily as directed.; DX:e.11.65 100 each 12   naproxen sodium (ALEVE) 220 MG tablet Take 220 mg by mouth daily as needed (pain).     ONETOUCH VERIO test strip 3 (three) times daily.     potassium chloride SA (KLOR-CON M20) 20 MEQ tablet TAKE 1 TABLET (20 MEQ TOTAL) DAILY BY MOUTH. 90 tablet 3   rosuvastatin (CRESTOR) 5 MG tablet Take 1 tablet (5 mg total) by mouth daily. For cholesterol 90 tablet 3   torsemide (DEMADEX) 100 MG tablet Take 1 tablet (100 mg total) by mouth daily. On arising and at lunchtime 90 tablet 3   Current Facility-Administered Medications  Medication Dose Route Frequency Provider Last Rate Last Admin   0.9 %  sodium chloride infusion  500 mL Intravenous Once Danis, Kirke Corin, MD  ___________________________________________________________________ Objective   Exam:  BP (!) 155/93    Pulse 76    Temp 98.1 F (36.7 C)    Ht '5\' 11"'  (1.803 m)    Wt 250 lb (113.4 kg)    SpO2 96%    BMI 34.87 kg/m   CV: RRR without murmur, S1/S2 Resp: clear to auscultation bilaterally, normal RR and effort noted GI: soft, no tenderness, with active bowel sounds.   Assessment: Encounter Diagnosis  Name Primary?   Positive colorectal cancer screening using Cologuard test Yes     Plan: Colonoscopy  The benefits and risks of the planned procedure were described in detail with the patient or (when appropriate) their health care proxy.  Risks were outlined as  including, but not limited to, bleeding, infection, perforation, adverse medication reaction leading to cardiac or pulmonary decompensation, pancreatitis (if ERCP).  The limitation of incomplete mucosal visualization was also discussed.  No guarantees or warranties were given.    The patient is appropriate for an endoscopic procedure in the ambulatory setting.   - Wilfrid Lund, MD

## 2021-12-01 NOTE — Progress Notes (Signed)
PT taken to PACU. Monitors in place. VSS. Report given to RN. 

## 2021-12-01 NOTE — Patient Instructions (Addendum)
YOU HAD AN ENDOSCOPIC PROCEDURE TODAY AT [facility]:   Refer to the procedure report that was given to you for any specific questions about what was found during the examination.  If the procedure report does not answer your questions, please call your gastroenterologist to clarify.  If you requested that your care partner not be given the details of your procedure findings, then the procedure report has been included in a sealed envelope for you to review at your convenience later.  YOU SHOULD EXPECT: Some feelings of bloating in the abdomen. Passage of more gas than usual.  Walking can help get rid of the air that was put into your GI tract during the procedure and reduce the bloating. If you had a lower endoscopy (such as a colonoscopy or flexible sigmoidoscopy) you may notice spotting of blood in your stool or on the toilet paper. If you underwent a bowel prep for your procedure, you may not have a normal bowel movement for a few days.  Please Note:  You might notice some irritation and congestion in your nose or some drainage.  This is from the oxygen used during your procedure.  There is no need for concern and it should clear up in a day or so.  SYMPTOMS TO REPORT IMMEDIATELY:  Following lower endoscopy (colonoscopy or flexible sigmoidoscopy):  Excessive amounts of blood in the stool  Significant tenderness or worsening of abdominal pains  Swelling of the abdomen that is new, acute  Fever of 100F or higher   For urgent or emergent issues, a gastroenterologist can be reached at any hour by calling [phone]. Do not use MyChart messaging for urgent concerns.    DIET:  We do recommend a small meal at first, but then you may proceed to your regular diet.  Drink plenty of fluids but you should avoid alcoholic beverages for 24 hours.  ACTIVITY:  You should plan to take it easy for the rest of today and you should NOT DRIVE or use heavy machinery until tomorrow (because  of the sedation medicines used during the test).    FOLLOW UP: Our staff will call the number listed on your records 48-72 hours following your procedure to check on you and address any questions or concerns that you may have regarding the information given to you following your procedure. If we do not reach you, we will leave a message.  We will attempt to reach you two times.  During this call, we will ask if you have developed any symptoms of COVID 19. If you develop any symptoms (ie: fever, flu-like symptoms, shortness of breath, cough etc.) before then, please call [phone].  If you test positive for Covid 19 in the 2 weeks post procedure, please call and report this information to [place].    If any biopsies were taken you will be contacted by phone or by letter within the next 1-3 weeks.  Please call us at [phone] if you have not heard about the biopsies in 3 weeks.    SIGNATURES/CONFIDENTIALITY: You and/or your care partner have signed paperwork which will be entered into your electronic medical record.  These signatures attest to the fact that that the information above on your After Visit Summary has been reviewed and is understood.  Full responsibility of the confidentiality of this discharge information lies with you and/or your care-partner.    Resume medications. Information given on polyps and hemorrhoids.

## 2021-12-03 ENCOUNTER — Telehealth: Payer: Self-pay

## 2021-12-03 NOTE — Telephone Encounter (Signed)
°  Follow up Call-  Call back number 12/01/2021  Post procedure Call Back phone  # [phone]  Permission to leave phone message Yes  Some recent data might be hidden     Patient questions:  Do you have a fever, pain , or abdominal swelling? No. Pain Score  0 *  Have you tolerated food without any problems? Yes.    Have you been able to return to your normal activities? Yes.    Do you have any questions about your discharge instructions: Diet   No. Medications  No. Follow up visit  No.  Do you have questions or concerns about your Care? No.  Actions: * If pain score is 4 or above: No action needed, pain <4.  Have you developed a fever since your procedure? no  2.   Have you had an respiratory symptoms (SOB or cough) since your procedure? no  3.   Have you tested positive for COVID 19 since your procedure no  4.   Have you had any family members/close contacts diagnosed with the COVID 19 since your procedure?  no   If yes to any of these questions please route to Joylene John, RN and Joella Prince, RN

## 2021-12-15 ENCOUNTER — Encounter: Payer: Self-pay | Admitting: Gastroenterology

## 2021-12-15 DIAGNOSIS — E1165 Type 2 diabetes mellitus with hyperglycemia: Secondary | ICD-10-CM | POA: Diagnosis not present

## 2021-12-18 ENCOUNTER — Other Ambulatory Visit: Payer: Self-pay | Admitting: Family Medicine

## 2021-12-18 DIAGNOSIS — R609 Edema, unspecified: Secondary | ICD-10-CM

## 2022-01-04 ENCOUNTER — Other Ambulatory Visit: Payer: Self-pay

## 2022-01-04 ENCOUNTER — Ambulatory Visit: Payer: Medicare HMO | Admitting: Urology

## 2022-01-04 ENCOUNTER — Encounter: Payer: Self-pay | Admitting: Urology

## 2022-01-04 VITALS — BP 117/77 | HR 81 | Wt 265.0 lb

## 2022-01-04 DIAGNOSIS — R351 Nocturia: Secondary | ICD-10-CM | POA: Diagnosis not present

## 2022-01-04 DIAGNOSIS — N471 Phimosis: Secondary | ICD-10-CM

## 2022-01-04 MED ORDER — CLOTRIMAZOLE-BETAMETHASONE 1-0.05 % EX CREA: 1.0000 "application " | TOPICAL_CREAM | Freq: Two times a day (BID) | CUTANEOUS | 0 refills | Status: DC

## 2022-01-04 NOTE — Progress Notes (Signed)
History of Present Illness: Last seen just under 3 years ago for phimosis.  At that point not severe but somewhat symptomatic.  He was given Lotrisone cream.  He comes in today for follow-up.  Still a bit difficult to scan his foreskin back, but overall over the past 3 years no infections.  The only difficulty with urination is nocturia x2-3.  Good daytime stream.  He is diabetic.  He does not complain of recent yeast infections.  Past Medical History:  Diagnosis Date   Arthritis    COPD (chronic obstructive pulmonary disease) ([place])    not on home o2   Diabetes mellitus without complication ([place])    Gallstones    Gout    Hyperlipidemia    Hypertension    Obesity    Pneumonia     Past Surgical History:  Procedure Laterality Date   CHOLECYSTECTOMY     TONSILLECTOMY      Home Medications:  Allergies as of 01/04/2022   No Known Allergies      Medication List        Accurate as of January 04, 2022 10:44 AM. If you have any questions, ask your nurse or doctor.          albuterol 108 (90 Base) MCG/ACT inhaler Commonly known as: VENTOLIN HFA Inhale 2 puffs into the lungs 2 (two) times daily.   allopurinol 100 MG tablet Commonly known as: ZYLOPRIM TAKE 1 TABLET (100 MG TOTAL) BY MOUTH DAILY. TO PREVENT GOUT   amLODipine 5 MG tablet Commonly known as: NORVASC Take 1 tablet (5 mg total) by mouth daily.   aspirin EC 81 MG tablet Take 81 mg by mouth daily. Swallow whole.   budesonide-formoterol 160-4.5 MCG/ACT inhaler Commonly known as: Symbicort INHALE 2 PUFFS INTO THE LUNGS IN THE MORNING AND AT BEDTIME. TAKE 2 PUFFS BY MOUTH TWICE A DAY   clotrimazole-betamethasone cream Commonly known as: Lotrisone Apply 1 application topically 2 (two) times daily. Started by: Jorja Loa, MD   FreeStyle Libre 2 Sensor Misc USE TO TEST BLOOD SUGAR CONTINUOUSLY. Dx. E11.65 SEND TO [facility] FOR MEDICARE COVERAGE, DO NOT SEND TO LOCAL PHARMACY   Insulin Pen  Needle 31G X 8 MM Misc Use to inject insulin daily. DX: E11.59   ipratropium-albuterol 0.5-2.5 (3) MG/3ML Soln Commonly known as: DUONEB Take 3 mLs by nebulization every 4 (four) hours as needed. What changed: reasons to take this   losartan 100 MG tablet Commonly known as: COZAAR Take 1 tablet (100 mg total) by mouth daily.   meloxicam 15 MG tablet Commonly known as: MOBIC Take 1 tablet (15 mg total) by mouth daily.   metFORMIN 750 MG 24 hr tablet Commonly known as: GLUCOPHAGE-XR Take 2 tablets (1,500 mg total) by mouth daily with breakfast.   Microlet Lancets Misc Use to test blood sugar 3 times daily as directed.; DX:e.11.65   naproxen sodium 220 MG tablet Commonly known as: ALEVE Take 220 mg by mouth daily as needed (pain).   OneTouch Verio Flex System w/Device Kit TEST BLOOD SUGAR 3 TIMES DAILY AS DIRECTED. DX: E11.65   OneTouch Verio test strip Generic drug: glucose blood 3 (three) times daily.   potassium chloride SA 20 MEQ tablet Commonly known as: Klor-Con M20 TAKE 1 TABLET (20 MEQ TOTAL) DAILY BY MOUTH.   rosuvastatin 5 MG tablet Commonly known as: Crestor Take 1 tablet (5 mg total) by mouth daily. For cholesterol   torsemide 100 MG tablet Commonly known as: DEMADEX  Take 1 tablet (100 mg total) by mouth 2 (two) times daily. At arising and at lunch        Allergies: No Known Allergies  Family History  Problem Relation Age of Onset   Asthma Mother    Diabetes Father    Heart attack Father 67   Leukemia Brother    Esophageal cancer Paternal Uncle    Lung disease Other        pat cousin   Heart attack Other        pat cousin   Colon cancer Neg Hx    Stomach cancer Neg Hx     Social History:  reports that he quit smoking about 4 years ago. His smoking use included cigarettes and e-cigarettes. He has a 10.00 pack-year smoking history. His smokeless tobacco use includes chew. He reports current alcohol use. He reports that he does not use  drugs.  ROS: A complete review of systems was performed.  All systems are negative except for pertinent findings as noted.  Physical Exam:  Vital signs in last 24 hours: BP 117/77    Pulse 81    Wt 265 lb (120.2 kg)    BMI 36.96 kg/m  Constitutional:  Alert and oriented, No acute distress Cardiovascular: Regular rate  Respiratory: Normal respiratory effort GI: Abdomen is obese.  No inguinal hernias Genitourinary: Normal uncircumcised male phallus with mild to moderate phimosis, testes are descended bilaterally and non-tender and without masses, scrotum is normal in appearance without lesions or masses, perineum is normal on inspection.  Normal anal sphincter tone.  Apex of gland palpably normal.  Long anal canal precludes full exam but no nodularity present Lymphatic: No lymphadenopathy Neurologic: Grossly intact, no focal deficits Psychiatric: Normal mood and affect  I have reviewed prior pt notes  I have reviewed notes from revious physicians  I have reviewed urinalysis results  I have independently reviewed prior imaging  I looked for prior PSA results but none of been done recently  I have reviewed prior urine culture   Impression/Assessment:  1.  Phimosis, mild to moderate.  No evidence of balanitis.  Apparently he has good glucose control  2.  Nocturia  Plan:  1.  Limit fluids in late afternoon and evening, limit sodium in diet  2.  I would recommend PSA recheck in the future by Dr. Livia Snellen  3.  He was given refill for Lotrisone cream-I would suggest if he still needs is a bit down the road to ask Dr. Livia Snellen for these  4.  I did discuss circumcision with him, risks and what the procedure is like.  If he wants to have this done in the future he will let me know, otherwise return as needed

## 2022-01-04 NOTE — Progress Notes (Signed)
Urological Symptom Review  Patient is experiencing the following symptoms: Get up at night to urinate Stream starts and stops Erection problems (male only)   Review of Systems  Gastrointestinal (upper)  : Negative for upper GI symptoms  Gastrointestinal (lower) : Negative for lower GI symptoms  Constitutional : Negative for symptoms  Skin: Negative for skin symptoms  Eyes: Negative for eye symptoms  Ear/Nose/Throat : Negative for Ear/Nose/Throat symptoms  Hematologic/Lymphatic: Easy bruising  Cardiovascular : Negative for cardiovascular symptoms  Respiratory : Negative for respiratory symptoms  Endocrine: Negative for endocrine symptoms  Musculoskeletal: Negative for musculoskeletal symptoms  Neurological: Negative for neurological symptoms  Psychologic: Negative for psychiatric symptoms

## 2022-01-15 DIAGNOSIS — E1165 Type 2 diabetes mellitus with hyperglycemia: Secondary | ICD-10-CM | POA: Diagnosis not present

## 2022-01-31 DIAGNOSIS — H5203 Hypermetropia, bilateral: Secondary | ICD-10-CM | POA: Diagnosis not present

## 2022-01-31 DIAGNOSIS — Z0100 Encounter for examination of eyes and vision without abnormal findings: Secondary | ICD-10-CM | POA: Diagnosis not present

## 2022-02-15 DIAGNOSIS — E1165 Type 2 diabetes mellitus with hyperglycemia: Secondary | ICD-10-CM | POA: Diagnosis not present

## 2022-02-22 ENCOUNTER — Ambulatory Visit (INDEPENDENT_AMBULATORY_CARE_PROVIDER_SITE_OTHER): Payer: Medicare HMO | Admitting: Family Medicine

## 2022-02-22 ENCOUNTER — Encounter: Payer: Self-pay | Admitting: Family Medicine

## 2022-02-22 VITALS — BP 108/72 | HR 70 | Temp 97.7°F | Ht 71.0 in | Wt 264.4 lb

## 2022-02-22 DIAGNOSIS — R609 Edema, unspecified: Secondary | ICD-10-CM

## 2022-02-22 DIAGNOSIS — R351 Nocturia: Secondary | ICD-10-CM

## 2022-02-22 DIAGNOSIS — E1165 Type 2 diabetes mellitus with hyperglycemia: Secondary | ICD-10-CM

## 2022-02-22 DIAGNOSIS — Z794 Long term (current) use of insulin: Secondary | ICD-10-CM | POA: Diagnosis not present

## 2022-02-22 DIAGNOSIS — I10 Essential (primary) hypertension: Secondary | ICD-10-CM | POA: Diagnosis not present

## 2022-02-22 DIAGNOSIS — E782 Mixed hyperlipidemia: Secondary | ICD-10-CM | POA: Diagnosis not present

## 2022-02-22 DIAGNOSIS — N401 Enlarged prostate with lower urinary tract symptoms: Secondary | ICD-10-CM | POA: Diagnosis not present

## 2022-02-22 LAB — CBC WITH DIFFERENTIAL/PLATELET
Basophils Absolute: 0 10*3/uL (ref 0.0–0.2)
Basos: 1 %
EOS (ABSOLUTE): 0.3 10*3/uL (ref 0.0–0.4)
Eos: 6 %
Hematocrit: 39 % (ref 37.5–51.0)
Hemoglobin: 13.3 g/dL (ref 13.0–17.7)
Immature Grans (Abs): 0 10*3/uL (ref 0.0–0.1)
Immature Granulocytes: 0 %
Lymphocytes Absolute: 2.6 10*3/uL (ref 0.7–3.1)
Lymphs: 45 %
MCH: 31.2 pg (ref 26.6–33.0)
MCHC: 34.1 g/dL (ref 31.5–35.7)
MCV: 92 fL (ref 79–97)
Monocytes Absolute: 0.6 10*3/uL (ref 0.1–0.9)
Monocytes: 10 %
Neutrophils Absolute: 2.1 10*3/uL (ref 1.4–7.0)
Neutrophils: 38 %
Platelets: 226 10*3/uL (ref 150–450)
RBC: 4.26 x10E6/uL (ref 4.14–5.80)
RDW: 13.2 % (ref 11.6–15.4)
WBC: 5.7 10*3/uL (ref 3.4–10.8)

## 2022-02-22 LAB — BAYER DCA HB A1C WAIVED: HB A1C (BAYER DCA - WAIVED): 5.5 % (ref 4.8–5.6)

## 2022-02-22 LAB — CMP14+EGFR
ALT: 15 IU/L (ref 0–44)
AST: 18 IU/L (ref 0–40)
Albumin/Globulin Ratio: 1.8 (ref 1.2–2.2)
Albumin: 4.6 g/dL (ref 3.8–4.8)
Alkaline Phosphatase: 47 IU/L (ref 44–121)
BUN/Creatinine Ratio: 28 — ABNORMAL HIGH (ref 10–24)
BUN: 30 mg/dL — ABNORMAL HIGH (ref 8–27)
Bilirubin Total: 0.5 mg/dL (ref 0.0–1.2)
CO2: 23 mmol/L (ref 20–29)
Calcium: 9.1 mg/dL (ref 8.6–10.2)
Chloride: 106 mmol/L (ref 96–106)
Creatinine, Ser: 1.09 mg/dL (ref 0.76–1.27)
Globulin, Total: 2.5 g/dL (ref 1.5–4.5)
Glucose: 107 mg/dL — ABNORMAL HIGH (ref 70–99)
Potassium: 4 mmol/L (ref 3.5–5.2)
Sodium: 143 mmol/L (ref 134–144)
Total Protein: 7.1 g/dL (ref 6.0–8.5)
eGFR: 74 mL/min/{1.73_m2} (ref >59)

## 2022-02-22 LAB — LIPID PANEL
Chol/HDL Ratio: 2.7 ratio (ref 0.0–5.0)
Cholesterol, Total: 152 mg/dL (ref 100–199)
HDL: 56 mg/dL (ref >39)
LDL Chol Calc (NIH): 78 mg/dL (ref 0–99)
Triglycerides: 95 mg/dL (ref 0–149)
VLDL Cholesterol Cal: 18 mg/dL (ref 5–40)

## 2022-02-22 MED ORDER — TAMSULOSIN HCL 0.4 MG PO CAPS
0.8000 mg | ORAL_CAPSULE | Freq: Every day | ORAL | 3 refills | Status: DC
Start: 2022-02-22 — End: 2023-02-17

## 2022-02-22 MED ORDER — TORSEMIDE 100 MG PO TABS: 100.0000 mg | ORAL_TABLET | Freq: Two times a day (BID) | ORAL | 2 refills | Status: DC

## 2022-02-22 MED ORDER — METFORMIN HCL ER 750 MG PO TB24: 750.0000 mg | ORAL_TABLET | Freq: Every day | ORAL | 3 refills | Status: DC

## 2022-02-22 NOTE — Progress Notes (Signed)
? ?Subjective:  ?Patient ID: ANTHON HARPOLE,  ?male    DOB: [birth year]  Age: 71 y.o.  ? ? ?CC: Medical Management of Chronic Issues ? ? ?HPI ?Benson Setting Fauble presents for  follow-up of hypertension. Patient has no history of headache chest pain or shortness of breath or recent cough. Patient also denies symptoms of TIA such as numbness weakness lateralizing. Patient denies side effects from medication. States taking it regularly. ? ?Patient also  in for follow-up of elevated cholesterol. Doing well without complaints on current medication. Denies side effects  including myalgia and arthralgia and nausea. Also in today for liver function testing. Currently no chest pain, shortness of breath or other cardiovascular related symptoms noted. ? ?Follow-up of diabetes. Patient does check blood sugar at home. Readings averaging 100. Patient denies symptoms such as excessive hunger or urinary frequency, excessive hunger, nausea ?No significant hypoglycemic spells noted. ?Medications reviewed. Pt reports taking them regularly. Pt. denies complication/adverse reaction today.  ? ? ?History ?Ajene has a past medical history of Arthritis, COPD (chronic obstructive pulmonary disease) ([place]), Diabetes mellitus without complication ([place]), Gallstones, Gout, Hyperlipidemia, Hypertension, Obesity, and Pneumonia.  ? ?He has a past surgical history that includes Cholecystectomy and Tonsillectomy.  ? ?His family history includes Asthma in his mother; Diabetes in his father; Esophageal cancer in his paternal uncle; Heart attack in an other family member; Heart attack (age of onset: 3) in his father; Leukemia in his brother; Lung disease in an other family member.He reports that he quit smoking about 5 years ago. His smoking use included cigarettes and e-cigarettes. He has a 10.00 pack-year smoking history. His smokeless tobacco use includes chew. He reports current alcohol use. He reports that he does not use drugs. ? ?Current  Outpatient Medications on File Prior to Visit  ?Medication Sig Dispense Refill  ? albuterol (VENTOLIN HFA) 108 (90 Base) MCG/ACT inhaler Inhale 2 puffs into the lungs 2 (two) times daily. 18 g 5  ? allopurinol (ZYLOPRIM) 100 MG tablet TAKE 1 TABLET (100 MG TOTAL) BY MOUTH DAILY. TO PREVENT GOUT 90 tablet 3  ? amLODipine (NORVASC) 5 MG tablet Take 1 tablet (5 mg total) by mouth daily. 90 tablet 3  ? aspirin EC 81 MG tablet Take 81 mg by mouth daily. Swallow whole.    ? Blood Glucose Monitoring Suppl (ONETOUCH VERIO FLEX SYSTEM) w/Device KIT TEST BLOOD SUGAR 3 TIMES DAILY AS DIRECTED. DX: E11.65 1 kit 0  ? budesonide-formoterol (SYMBICORT) 160-4.5 MCG/ACT inhaler INHALE 2 PUFFS INTO THE LUNGS IN THE MORNING AND AT BEDTIME. TAKE 2 PUFFS BY MOUTH TWICE A DAY 30.6 each 3  ? clotrimazole-betamethasone (LOTRISONE) cream Apply 1 application topically 2 (two) times daily. 30 g 0  ? Continuous Blood Gluc Sensor (FREESTYLE LIBRE 2 SENSOR) MISC USE TO TEST BLOOD SUGAR CONTINUOUSLY. Dx. E11.65 SEND TO [facility] FOR MEDICARE COVERAGE, DO NOT SEND TO LOCAL PHARMACY 2 each 0  ? Insulin Pen Needle 31G X 8 MM MISC Use to inject insulin daily. DX: E11.59 100 each 12  ? ipratropium-albuterol (DUONEB) 0.5-2.5 (3) MG/3ML SOLN Take 3 mLs by nebulization every 4 (four) hours as needed. (Patient taking differently: Take 3 mLs by nebulization every 4 (four) hours as needed (SOB).) 360 mL 3  ? losartan (COZAAR) 100 MG tablet Take 1 tablet (100 mg total) by mouth daily. 90 tablet 3  ? meloxicam (MOBIC) 15 MG tablet Take 1 tablet (15 mg total) by mouth daily. 90 tablet 3  ? Microlet Lancets  MISC Use to test blood sugar 3 times daily as directed.; DX:e.11.65 100 each 12  ? potassium chloride SA (KLOR-CON M20) 20 MEQ tablet TAKE 1 TABLET (20 MEQ TOTAL) DAILY BY MOUTH. 90 tablet 3  ? rosuvastatin (CRESTOR) 5 MG tablet Take 1 tablet (5 mg total) by mouth daily. For cholesterol 90 tablet 3  ? ?No current facility-administered medications on  file prior to visit.  ? ? ?ROS ?Review of Systems  ?Constitutional:  Negative for fever.  ?Respiratory:  Negative for shortness of breath.   ?Cardiovascular:  Negative for chest pain.  ?Genitourinary:  Positive for frequency (nocturia every 2 hours).  ?Musculoskeletal:  Positive for back pain (reduced meloxicam to qod at insurance nurse's recommendation over the phone). Negative for arthralgias.  ?Skin:  Negative for rash.  ? ?Objective:  ?BP 108/72   Pulse 70   Temp 97.7 ?F (36.5 ?C)   Ht '5\' 11"'  (1.803 m)   Wt 264 lb 6.4 oz (119.9 kg)   SpO2 96%   BMI 36.88 kg/m?  ? ?BP Readings from Last 3 Encounters:  ?02/22/22 108/72  ?01/04/22 117/77  ?12/01/21 107/65  ? ? ?Wt Readings from Last 3 Encounters:  ?02/22/22 264 lb 6.4 oz (119.9 kg)  ?01/04/22 265 lb (120.2 kg)  ?12/01/21 250 lb (113.4 kg)  ? ? ? ?Physical Exam ?Vitals reviewed.  ?Constitutional:   ?   Appearance: He is well-developed. He is obese.  ?HENT:  ?   Head: Normocephalic and atraumatic.  ?   Right Ear: External ear normal.  ?   Left Ear: External ear normal.  ?   Mouth/Throat:  ?   Pharynx: No oropharyngeal exudate or posterior oropharyngeal erythema.  ?Eyes:  ?   Pupils: Pupils are equal, round, and reactive to light.  ?Cardiovascular:  ?   Rate and Rhythm: Normal rate and regular rhythm.  ?   Heart sounds: No murmur heard. ?Pulmonary:  ?   Effort: No respiratory distress.  ?   Breath sounds: Normal breath sounds.  ?Musculoskeletal:  ?   Cervical back: Normal range of motion and neck supple.  ?Neurological:  ?   Mental Status: He is alert and oriented to person, place, and time.  ? ? ?Diabetic Foot Exam - Simple   ?No data filed ?  ? ? ? ? ?Assessment & Plan:  ? ?Sierra was seen today for medical management of chronic issues. ? ?Diagnoses and all orders for this visit: ? ?Type 2 diabetes mellitus with hyperglycemia, with long-term current use of insulin ([place]) ?-     Bayer DCA Hb A1c Waived ? ?Essential hypertension, benign ?-     CBC with  Differential/Platelet ?-     CMP14+EGFR ? ?Mixed hyperlipidemia ?-     Lipid panel ? ?Edema, unspecified type ?-     torsemide (DEMADEX) 100 MG tablet; Take 1 tablet (100 mg total) by mouth 2 (two) times daily. At arising and at lunch ? ?Benign prostatic hyperplasia with nocturia ?-     PSA, total and free ? ?Other orders ?-     tamsulosin (FLOMAX) 0.4 MG CAPS capsule; Take 2 capsules (0.8 mg total) by mouth at bedtime. For urine flow and prostate ?-     metFORMIN (GLUCOPHAGE-XR) 750 MG 24 hr tablet; Take 1 tablet (750 mg total) by mouth daily with breakfast. ? ? ?I have discontinued Issiah Huffaker. Licausi's naproxen sodium and OneTouch Verio. I have also changed his metFORMIN. Additionally, I am having him start on tamsulosin.  Lastly, I am having him maintain his ipratropium-albuterol, aspirin EC, albuterol, Insulin Pen Needle, FreeStyle Libre 2 Sensor, Microlet Lancets, allopurinol, losartan, potassium chloride SA, budesonide-formoterol, meloxicam, rosuvastatin, OneTouch Verio Flex System, amLODipine, clotrimazole-betamethasone, and torsemide. ? ?Meds ordered this encounter  ?Medications  ? torsemide (DEMADEX) 100 MG tablet  ?  Sig: Take 1 tablet (100 mg total) by mouth 2 (two) times daily. At arising and at lunch  ?  Dispense:  180 tablet  ?  Refill:  2  ? tamsulosin (FLOMAX) 0.4 MG CAPS capsule  ?  Sig: Take 2 capsules (0.8 mg total) by mouth at bedtime. For urine flow and prostate  ?  Dispense:  180 capsule  ?  Refill:  3  ? metFORMIN (GLUCOPHAGE-XR) 750 MG 24 hr tablet  ?  Sig: Take 1 tablet (750 mg total) by mouth daily with breakfast.  ?  Dispense:  90 tablet  ?  Refill:  3  ? ? ? ?Follow-up: Return in about 3 months (around 05/25/2022). ? ?[name], M.D. ?

## 2022-02-24 LAB — PSA, TOTAL AND FREE
PSA, Free Pct: 14 %
PSA, Free: 0.21 ng/mL
Prostate Specific Ag, Serum: 1.5 ng/mL (ref 0.0–4.0)

## 2022-02-24 LAB — SPECIMEN STATUS REPORT

## 2022-03-28 DIAGNOSIS — E1165 Type 2 diabetes mellitus with hyperglycemia: Secondary | ICD-10-CM | POA: Diagnosis not present

## 2022-04-28 DIAGNOSIS — E1165 Type 2 diabetes mellitus with hyperglycemia: Secondary | ICD-10-CM | POA: Diagnosis not present

## 2022-05-09 NOTE — Progress Notes (Signed)
History of Present Illness:  1.17.2023: Last seen just under 3 years ago for phimosis.  At that point not severe but somewhat symptomatic.  He was given Lotrisone cream.  He comes in today for follow-up.  Still a bit difficult to scan his foreskin back, but overall over the past 3 years no infections.  The only difficulty with urination is nocturia x2-3.  Good daytime stream.  He is diabetic.  He does not complain of recent yeast infections.    5.23.2023: In his last visit he was given Lotrisone cream.  This really has not influenced his above symptoms.  He would like to consider circumcision.  Past Medical History:  Diagnosis Date   Arthritis    COPD (chronic obstructive pulmonary disease) ([place])    not on home o2   Diabetes mellitus without complication ([place])    Gallstones    Gout    Hyperlipidemia    Hypertension    Obesity    Pneumonia     Past Surgical History:  Procedure Laterality Date   CHOLECYSTECTOMY     TONSILLECTOMY      Home Medications:  Allergies as of 05/10/2022   No Known Allergies      Medication List        Accurate as of May 09, 2022  9:27 PM. If you have any questions, ask your nurse or doctor.          albuterol 108 (90 Base) MCG/ACT inhaler Commonly known as: VENTOLIN HFA Inhale 2 puffs into the lungs 2 (two) times daily.   allopurinol 100 MG tablet Commonly known as: ZYLOPRIM TAKE 1 TABLET (100 MG TOTAL) BY MOUTH DAILY. TO PREVENT GOUT   amLODipine 5 MG tablet Commonly known as: NORVASC Take 1 tablet (5 mg total) by mouth daily.   aspirin EC 81 MG tablet Take 81 mg by mouth daily. Swallow whole.   budesonide-formoterol 160-4.5 MCG/ACT inhaler Commonly known as: Symbicort INHALE 2 PUFFS INTO THE LUNGS IN THE MORNING AND AT BEDTIME. TAKE 2 PUFFS BY MOUTH TWICE A DAY   clotrimazole-betamethasone cream Commonly known as: Lotrisone Apply 1 application topically 2 (two) times daily.   FreeStyle Libre 2 Sensor Misc USE TO TEST BLOOD  SUGAR CONTINUOUSLY. Dx. E11.65 SEND TO [facility] FOR MEDICARE COVERAGE, DO NOT SEND TO LOCAL PHARMACY   Insulin Pen Needle 31G X 8 MM Misc Use to inject insulin daily. DX: E11.59   ipratropium-albuterol 0.5-2.5 (3) MG/3ML Soln Commonly known as: DUONEB Take 3 mLs by nebulization every 4 (four) hours as needed. What changed: reasons to take this   losartan 100 MG tablet Commonly known as: COZAAR Take 1 tablet (100 mg total) by mouth daily.   meloxicam 15 MG tablet Commonly known as: MOBIC Take 1 tablet (15 mg total) by mouth daily.   metFORMIN 750 MG 24 hr tablet Commonly known as: GLUCOPHAGE-XR Take 1 tablet (750 mg total) by mouth daily with breakfast.   Microlet Lancets Misc Use to test blood sugar 3 times daily as directed.; DX:e.11.65   OneTouch Verio Flex System w/Device Kit TEST BLOOD SUGAR 3 TIMES DAILY AS DIRECTED. DX: E11.65   potassium chloride SA 20 MEQ tablet Commonly known as: Klor-Con M20 TAKE 1 TABLET (20 MEQ TOTAL) DAILY BY MOUTH.   rosuvastatin 5 MG tablet Commonly known as: Crestor Take 1 tablet (5 mg total) by mouth daily. For cholesterol   tamsulosin 0.4 MG Caps capsule Commonly known as: FLOMAX Take 2 capsules (0.8 mg total) by mouth  at bedtime. For urine flow and prostate   torsemide 100 MG tablet Commonly known as: DEMADEX Take 1 tablet (100 mg total) by mouth 2 (two) times daily. At arising and at lunch        Allergies: No Known Allergies  Family History  Problem Relation Age of Onset   Asthma Mother    Diabetes Father    Heart attack Father 72   Leukemia Brother    Esophageal cancer Paternal Uncle    Lung disease Other        pat cousin   Heart attack Other        pat cousin   Colon cancer Neg Hx    Stomach cancer Neg Hx     Social History:  reports that he quit smoking about 5 years ago. His smoking use included cigarettes and e-cigarettes. He has a 10.00 pack-year smoking history. His smokeless tobacco use includes  chew. He reports current alcohol use. He reports that he does not use drugs.  ROS: A complete review of systems was performed.  All systems are negative except for pertinent findings as noted.  Physical Exam:  Vital signs in last 24 hours: There were no vitals taken for this visit. Constitutional:  Alert and oriented, No acute distress Cardiovascular: Regular rate  Respiratory: Normal respiratory effort Genitourinary: Phallus uncircumcised.  Moderate phimosis.  No active balanitis Lymphatic: No lymphadenopathy Neurologic: Grossly intact, no focal deficits Psychiatric: Normal mood and affect  I have reviewed prior pt notes  I have reviewed urinalysis results    Impression/Assessment:  1.  History of balanitis, with Lotrisone cream  2.  Persistent moderate, symptomatic phimosis  Plan:  1.  I discussed circumcision with patient, risks and complications  2.  We will get this performed morning

## 2022-05-10 ENCOUNTER — Ambulatory Visit (INDEPENDENT_AMBULATORY_CARE_PROVIDER_SITE_OTHER): Payer: Medicare HMO | Admitting: Urology

## 2022-05-10 VITALS — BP 110/68 | HR 87

## 2022-05-10 DIAGNOSIS — N471 Phimosis: Secondary | ICD-10-CM

## 2022-05-10 DIAGNOSIS — R351 Nocturia: Secondary | ICD-10-CM | POA: Diagnosis not present

## 2022-05-11 ENCOUNTER — Other Ambulatory Visit: Payer: Self-pay | Admitting: Urology

## 2022-05-17 ENCOUNTER — Encounter (HOSPITAL_BASED_OUTPATIENT_CLINIC_OR_DEPARTMENT_OTHER): Payer: Self-pay | Admitting: Urology

## 2022-05-17 ENCOUNTER — Other Ambulatory Visit: Payer: Self-pay

## 2022-05-17 NOTE — Progress Notes (Addendum)
Spoke w/ via phone for pre-op interview--- pt Lab needs dos----  [employer] results------ current ekg in epic/ chart COVID test -----patient states asymptomatic no test needed Arrive at ------- 1030 on 05-19-2022 NPO after MN NO Solid Food.  Clear liquids from MN until--- 0930 Med rec completed Medications to take morning of surgery ----- norvasc, crestor, allopurinol, symbicort inhaler Diabetic medication ----- do  not take metformin morning of surgery Patient instructed no nail polish to be worn day of surgery Patient instructed to bring photo id and insurance card day of surgery Patient aware to have Driver (ride ) / caregiver for 24 hours after surgery -- wife, Juan Merritt Patient Special Instructions -----  asked asked to bring inhaler Pre-Op special Istructions -----  pt has freestyle libre on right arm Patient verbalized understanding of instructions that were given at this phone interview. Patient denies shortness of breath, chest pain, fever, cough at this phone interview.

## 2022-05-19 ENCOUNTER — Encounter (HOSPITAL_BASED_OUTPATIENT_CLINIC_OR_DEPARTMENT_OTHER)
Admission: RE | Disposition: A | Discharge status: Home / Self Care | Payer: Self-pay | Source: Home / Self Care | Attending: Urology

## 2022-05-19 ENCOUNTER — Ambulatory Visit (HOSPITAL_BASED_OUTPATIENT_CLINIC_OR_DEPARTMENT_OTHER): Payer: Medicare HMO | Admitting: Anesthesiology

## 2022-05-19 ENCOUNTER — Ambulatory Visit (HOSPITAL_BASED_OUTPATIENT_CLINIC_OR_DEPARTMENT_OTHER)
Admission: RE | Admit: 2022-05-19 | Discharge: 2022-05-19 | Disposition: A | Discharge status: Home / Self Care | Payer: Medicare HMO | Attending: Urology | Admitting: Urology

## 2022-05-19 ENCOUNTER — Other Ambulatory Visit: Payer: Self-pay

## 2022-05-19 ENCOUNTER — Encounter (HOSPITAL_BASED_OUTPATIENT_CLINIC_OR_DEPARTMENT_OTHER): Payer: Self-pay | Admitting: Urology

## 2022-05-19 DIAGNOSIS — E119 Type 2 diabetes mellitus without complications: Secondary | ICD-10-CM | POA: Diagnosis not present

## 2022-05-19 DIAGNOSIS — N477 Other inflammatory diseases of prepuce: Secondary | ICD-10-CM | POA: Diagnosis not present

## 2022-05-19 DIAGNOSIS — Z79899 Other long term (current) drug therapy: Secondary | ICD-10-CM | POA: Insufficient documentation

## 2022-05-19 DIAGNOSIS — I10 Essential (primary) hypertension: Secondary | ICD-10-CM

## 2022-05-19 DIAGNOSIS — J449 Chronic obstructive pulmonary disease, unspecified: Secondary | ICD-10-CM | POA: Insufficient documentation

## 2022-05-19 DIAGNOSIS — Z87891 Personal history of nicotine dependence: Secondary | ICD-10-CM | POA: Insufficient documentation

## 2022-05-19 DIAGNOSIS — N471 Phimosis: Secondary | ICD-10-CM | POA: Diagnosis not present

## 2022-05-19 DIAGNOSIS — M1990 Unspecified osteoarthritis, unspecified site: Secondary | ICD-10-CM | POA: Insufficient documentation

## 2022-05-19 DIAGNOSIS — Z01818 Encounter for other preprocedural examination: Secondary | ICD-10-CM

## 2022-05-19 DIAGNOSIS — N486 Induration penis plastica: Secondary | ICD-10-CM | POA: Diagnosis not present

## 2022-05-19 DIAGNOSIS — Z794 Long term (current) use of insulin: Secondary | ICD-10-CM

## 2022-05-19 HISTORY — DX: Personal history of colon polyps, unspecified: Z86.0100

## 2022-05-19 HISTORY — DX: Mixed hyperlipidemia: E78.2

## 2022-05-19 HISTORY — DX: Chronic gout, unspecified, without tophus (tophi): M1A.9XX0

## 2022-05-19 HISTORY — PX: CIRCUMCISION: SHX1350

## 2022-05-19 HISTORY — DX: Type 2 diabetes mellitus without complications: E11.9

## 2022-05-19 HISTORY — DX: Personal history of COVID-19: Z86.16

## 2022-05-19 HISTORY — DX: Benign prostatic hyperplasia with lower urinary tract symptoms: N40.1

## 2022-05-19 HISTORY — DX: Personal history of colonic polyps: Z86.010

## 2022-05-19 HISTORY — DX: Phimosis: N47.1

## 2022-05-19 LAB — POCT I-STAT, CHEM 8
BUN: 29 mg/dL — ABNORMAL HIGH (ref 8–23)
Calcium, Ion: 1.17 mmol/L (ref 1.15–1.40)
Chloride: 101 mmol/L (ref 98–111)
Creatinine, Ser: 1.3 mg/dL — ABNORMAL HIGH (ref 0.61–1.24)
Glucose, Bld: 109 mg/dL — ABNORMAL HIGH (ref 70–99)
HCT: 41 % (ref 39.0–52.0)
Hemoglobin: 13.9 g/dL (ref 13.0–17.0)
Potassium: 3.5 mmol/L (ref 3.5–5.1)
Sodium: 140 mmol/L (ref 135–145)
TCO2: 28 mmol/L (ref 22–32)

## 2022-05-19 LAB — GLUCOSE, CAPILLARY: Glucose-Capillary: 108 mg/dL — ABNORMAL HIGH (ref 70–99)

## 2022-05-19 SURGERY — CIRCUMCISION, ADULT
Anesthesia: Monitor Anesthesia Care | Site: Penis

## 2022-05-19 MED ORDER — ONDANSETRON HCL 4 MG/2ML IJ SOLN
INTRAMUSCULAR | Status: AC
  Filled 2022-05-19: qty 6

## 2022-05-19 MED ORDER — TRAMADOL HCL 50 MG PO TABS: 50.0000 mg | ORAL_TABLET | Freq: Four times a day (QID) | ORAL | 0 refills | Status: DC | PRN

## 2022-05-19 MED ORDER — CEFAZOLIN SODIUM-DEXTROSE 2-3 GM-%(50ML) IV SOLR
INTRAVENOUS | Status: DC | PRN
  Administered 2022-05-19: 2 g via INTRAVENOUS

## 2022-05-19 MED ORDER — OXYCODONE HCL 5 MG PO TABS: 5.0000 mg | ORAL_TABLET | Freq: Once | ORAL | Status: DC | PRN

## 2022-05-19 MED ORDER — EPHEDRINE 5 MG/ML INJ
INTRAVENOUS | Status: AC
  Filled 2022-05-19: qty 5

## 2022-05-19 MED ORDER — OXYCODONE HCL 5 MG/5ML PO SOLN: 5.0000 mg | Freq: Once | ORAL | Status: DC | PRN

## 2022-05-19 MED ORDER — CEFAZOLIN SODIUM-DEXTROSE 2-4 GM/100ML-% IV SOLN
INTRAVENOUS | Status: AC
  Filled 2022-05-19: qty 100

## 2022-05-19 MED ORDER — LIDOCAINE 2% (20 MG/ML) 5 ML SYRINGE
INTRAMUSCULAR | Status: DC | PRN
  Administered 2022-05-19: 60 mg via INTRAVENOUS

## 2022-05-19 MED ORDER — MIDAZOLAM HCL 2 MG/2ML IJ SOLN
INTRAMUSCULAR | Status: DC | PRN
  Administered 2022-05-19: 1 mg via INTRAVENOUS

## 2022-05-19 MED ORDER — LIDOCAINE HCL (PF) 1 % IJ SOLN
INTRAMUSCULAR | Status: DC | PRN
  Administered 2022-05-19: 10 mL via SURGICAL_CAVITY

## 2022-05-19 MED ORDER — ONDANSETRON HCL 4 MG/2ML IJ SOLN
INTRAMUSCULAR | Status: DC | PRN
  Administered 2022-05-19: 4 mg via INTRAVENOUS

## 2022-05-19 MED ORDER — PROPOFOL 500 MG/50ML IV EMUL
INTRAVENOUS | Status: DC | PRN
  Administered 2022-05-19: 150 ug/kg/min via INTRAVENOUS

## 2022-05-19 MED ORDER — PROPOFOL 10 MG/ML IV BOLUS
INTRAVENOUS | Status: AC
  Filled 2022-05-19: qty 20

## 2022-05-19 MED ORDER — FENTANYL CITRATE (PF) 100 MCG/2ML IJ SOLN
INTRAMUSCULAR | Status: AC
  Filled 2022-05-19: qty 2

## 2022-05-19 MED ORDER — DEXAMETHASONE SODIUM PHOSPHATE 10 MG/ML IJ SOLN
INTRAMUSCULAR | Status: AC
  Filled 2022-05-19: qty 3

## 2022-05-19 MED ORDER — HYDROMORPHONE HCL 1 MG/ML IJ SOLN: 0.2500 mg | INTRAMUSCULAR | Status: DC | PRN

## 2022-05-19 MED ORDER — LACTATED RINGERS IV SOLN: INTRAVENOUS | Status: DC

## 2022-05-19 MED ORDER — MIDAZOLAM HCL 2 MG/2ML IJ SOLN
INTRAMUSCULAR | Status: AC
  Filled 2022-05-19: qty 2

## 2022-05-19 MED ORDER — PROPOFOL 10 MG/ML IV BOLUS
INTRAVENOUS | Status: DC | PRN
  Administered 2022-05-19 (×2): 20 mg via INTRAVENOUS

## 2022-05-19 MED ORDER — PROMETHAZINE HCL 25 MG/ML IJ SOLN: 6.2500 mg | INTRAMUSCULAR | Status: DC | PRN

## 2022-05-19 MED ORDER — LIDOCAINE HCL (PF) 2 % IJ SOLN
INTRAMUSCULAR | Status: AC
  Filled 2022-05-19: qty 20

## 2022-05-19 MED ORDER — 0.9 % SODIUM CHLORIDE (POUR BTL) OPTIME
TOPICAL | Status: DC | PRN
  Administered 2022-05-19: 500 mL

## 2022-05-19 MED ORDER — FENTANYL CITRATE (PF) 250 MCG/5ML IJ SOLN
INTRAMUSCULAR | Status: DC | PRN
Start: 2022-05-19 — End: 2022-05-19
  Administered 2022-05-19: 50 ug via INTRAVENOUS

## 2022-05-19 SURGICAL SUPPLY — 29 items
BLADE SURG 15 STRL LF DISP TIS (BLADE) ×1 IMPLANT
BLADE SURG 15 STRL SS (BLADE) ×2
BNDG COHESIVE 1X5 TAN STRL LF (GAUZE/BANDAGES/DRESSINGS) ×2 IMPLANT
BNDG CONFORM 2 STRL LF (GAUZE/BANDAGES/DRESSINGS) ×2 IMPLANT
COVER BACK TABLE 60X90IN (DRAPES) ×2 IMPLANT
COVER MAYO STAND STRL (DRAPES) ×1 IMPLANT
DRAPE LAPAROTOMY 100X72 PEDS (DRAPES) ×2 IMPLANT
DRSG VASELINE 3X18 (GAUZE/BANDAGES/DRESSINGS) IMPLANT
ELECT NDL TIP 2.8 STRL (NEEDLE) IMPLANT
ELECT NEEDLE TIP 2.8 STRL (NEEDLE) IMPLANT
ELECT REM PT RETURN 9FT ADLT (ELECTROSURGICAL) ×2
ELECTRODE REM PT RTRN 9FT ADLT (ELECTROSURGICAL) ×1 IMPLANT
GAUZE 4X4 16PLY ~~LOC~~+RFID DBL (SPONGE) ×2 IMPLANT
GAUZE PETROLATUM 1 X8 (GAUZE/BANDAGES/DRESSINGS) ×1 IMPLANT
GLOVE BIO SURGEON STRL SZ8 (GLOVE) ×2 IMPLANT
GOWN STRL REUS W/TWL XL LVL3 (GOWN DISPOSABLE) ×2 IMPLANT
KIT TURNOVER CYSTO (KITS) ×2 IMPLANT
MANIFOLD NEPTUNE II (INSTRUMENTS) IMPLANT
NEEDLE HYPO 22GX1.5 SAFETY (NEEDLE) ×2 IMPLANT
NS IRRIG 500ML POUR BTL (IV SOLUTION) ×2 IMPLANT
PACK BASIN DAY SURGERY FS (CUSTOM PROCEDURE TRAY) ×2 IMPLANT
PENCIL SMOKE EVACUATOR (MISCELLANEOUS) ×2 IMPLANT
SUT CHROMIC 4 0 RB 1X27 (SUTURE) ×3 IMPLANT
SUT CHROMIC 5 0 RB 1 27 (SUTURE) IMPLANT
SYR CONTROL 10ML LL (SYRINGE) ×2 IMPLANT
TOWEL OR 17X26 10 PK STRL BLUE (TOWEL DISPOSABLE) ×2 IMPLANT
TRAY DSU PREP LF (CUSTOM PROCEDURE TRAY) ×2 IMPLANT
TUBE CONNECTING 12X1/4 (SUCTIONS) IMPLANT
WATER STERILE IRR 500ML POUR (IV SOLUTION) ×1 IMPLANT

## 2022-05-19 NOTE — Anesthesia Postprocedure Evaluation (Signed)
Anesthesia Post Note  Patient: Juan Merritt  Procedure(s) Performed: CIRCUMCISION ADULT (Penis)     Patient location during evaluation: PACU Anesthesia Type: MAC Level of consciousness: awake and alert Pain management: pain level controlled Vital Signs Assessment: post-procedure vital signs reviewed and stable Respiratory status: spontaneous breathing, nonlabored ventilation, respiratory function stable and patient connected to nasal cannula oxygen Cardiovascular status: stable and blood pressure returned to baseline Postop Assessment: no apparent nausea or vomiting Anesthetic complications: no   No notable events documented.  Last Vitals:  Vitals:   05/19/22 1401 05/19/22 1430  BP: 92/66 115/76  Pulse: 64 66  Resp: 14 16  Temp:  36.6 C  SpO2: 95% 96%    Last Pain:  Vitals:   05/19/22 1430  TempSrc:   PainSc: 0-No pain                 Belenda Cruise P Leialoha Hanna

## 2022-05-19 NOTE — Anesthesia Preprocedure Evaluation (Signed)
Anesthesia Evaluation  Patient identified by MRN, date of birth, ID band Patient awake    Reviewed: Allergy & Precautions, NPO status , Patient's Chart, lab work & pertinent test results  Airway Mallampati: II  TM Distance: >3 FB Neck ROM: Full    Dental no notable dental hx.    Pulmonary COPD, former smoker,    Pulmonary exam normal breath sounds clear to auscultation       Cardiovascular hypertension, Pt. on medications negative cardio ROS Normal cardiovascular exam Rhythm:Regular Rate:Normal     Neuro/Psych negative neurological ROS  negative psych ROS   GI/Hepatic negative GI ROS, Neg liver ROS,   Endo/Other  negative endocrine ROSdiabetes, Type 2  Renal/GU negative Renal ROS  negative genitourinary   Musculoskeletal  (+) Arthritis , Osteoarthritis,    Abdominal (+) + obese,   Peds negative pediatric ROS (+)  Hematology negative hematology ROS (+)   Anesthesia Other Findings   Reproductive/Obstetrics negative OB ROS                             Anesthesia Physical Anesthesia Plan  ASA: 3  Anesthesia Plan: MAC   Post-op Pain Management: Minimal or no pain anticipated and Regional block*   Induction: Intravenous  PONV Risk Score and Plan: 1 and Ondansetron and Treatment may vary due to age or medical condition  Airway Management Planned: Simple Face Mask  Additional Equipment:   Intra-op Plan:   Post-operative Plan:   Informed Consent: I have reviewed the patients History and Physical, chart, labs and discussed the procedure including the risks, benefits and alternatives for the proposed anesthesia with the patient or authorized representative who has indicated his/her understanding and acceptance.     Dental advisory given  Plan Discussed with: CRNA  Anesthesia Plan Comments:         Anesthesia Quick Evaluation

## 2022-05-19 NOTE — Discharge Instructions (Addendum)
Postoperative instructions for circumcision  Wound:  Remove the dressing 2 days after surgery. In most cases your incision will have absorbable sutures that run along the course of your incision and will dissolve within the first 10-20 days. Some will fall out even earlier. Expect some redness as the sutures dissolved but this should occur only around the sutures. If there is generalized redness, especially with increasing pain or swelling, let us know. The penis will possibly get "black and blue" as the blood in the tissues spread. Sometimes the whole penis will turn colors. The black and blue is followed by a yellow and brown color. In time, all the discoloration will go away.  Diet:  You may return to your normal diet within 24 hours following your surgery. You may note some mild nausea and possibly vomiting the first 6-8 hours following surgery. This is usually due to the side effects of anesthesia, and will disappear quite soon. I would suggest clear liquids and a very light meal the first evening following your surgery.  Activity:  Your physical activity should be restricted the first 48 hours. During that time you should remain relatively inactive, moving about only when necessary. During the first 7-10 days following surgery he should avoid lifting any heavy objects (anything greater than 15 pounds), and avoid strenuous exercise. If you work, ask [place] specifically about your restrictions, both for work and home. We will write a note to your employer if needed.  Ice packs can be placed on and off over the penis for the first 48 hours to help relieve the pain and keep the swelling down. Frozen peas or corn in a ZipLock bag can be frozen, used and re-frozen. Fifteen minutes on and 15 minutes off is a reasonable schedule.  No sexual activity for 1 month.  Hygiene:  You may shower 24 hours after your surgery. Make sure wound is clean and dry afterwards. Tub bathing should be restricted until the  seventh day.  Medication:  You will be sent home with some type of pain medication. In many cases you will be sent home with a narcotic pain pill (Vicodin or Tylox). If the pain is not too bad, you may take either Tylenol (acetaminophen) or Advil (ibuprofen) which contain no narcotic agents, and might be tolerated a little better, with fewer side effects. If the pain medication you are sent home with does not control the pain, you will have to let us know. Some narcotic pain medications cannot be given or refilled by a phone call to a pharmacy.  Problems you should report to [place]:  Fever of 101.0 degrees Fahrenheit or greater. Moderate or severe swelling under the skin incision or involving the scrotum. Drug reaction such as hives, a rash, nausea or vomiting.  Difficulty voiding  Post Anesthesia Home Care Instructions  Activity: Get plenty of rest for the remainder of the day. A responsible adult should stay with you for 24 hours following the procedure.  For the next 24 hours, DO NOT: -Drive a car -Paediatric nurse -Drink alcoholic beverages -Take any medication unless instructed by your physician -Make any legal decisions or sign important papers.  Meals: Start with liquid foods such as gelatin or soup. Progress to regular foods as tolerated. Avoid greasy, spicy, heavy foods. If nausea and/or vomiting occur, drink only clear liquids until the nausea and/or vomiting subsides. Call your physician if vomiting continues.  Special Instructions/Symptoms: Your throat may feel dry or sore from the anesthesia or the breathing  tube placed in your throat during surgery. If this causes discomfort, gargle with warm salt water. The discomfort should disappear within 24 hours.

## 2022-05-19 NOTE — Interval H&P Note (Signed)
History and Physical Interval Note:  05/19/2022 11:48 AM  Juan Merritt  has presented today for surgery, with the diagnosis of PHIMOSIS.  The various methods of treatment have been discussed with the patient and family. After consideration of risks, benefits and other options for treatment, the patient has consented to  Procedure(s) with comments: CIRCUMCISION ADULT (N/A) - 45 MINUTES as a surgical intervention.  The patient's history has been reviewed, patient examined, no change in status, stable for surgery.  I have reviewed the patient's chart and labs.  Questions were answered to the patient's satisfaction.     Lillette Boxer Tyhir Schwan

## 2022-05-19 NOTE — Transfer of Care (Signed)
Immediate Anesthesia Transfer of Care Note  Patient: Juan Merritt  Procedure(s) Performed: CIRCUMCISION ADULT (Penis)  Patient Location: PACU  Anesthesia Type:MAC  Level of Consciousness: awake, alert  and oriented  Airway & Oxygen Therapy: Patient Spontanous Breathing  Post-op Assessment: Report given to RN and Post -op Vital signs reviewed and stable  Post vital signs: Reviewed and stable  Last Vitals:  Vitals Value Taken Time  BP 108/70 05/19/22 1338  Temp    Pulse 74 05/19/22 1339  Resp 19 05/19/22 1339  SpO2 97 % 05/19/22 1339  Vitals shown include unvalidated device data.  Last Pain:  Vitals:   05/19/22 1056  TempSrc: Oral  PainSc: 0-No pain      Patients Stated Pain Goal: 4 (13/64/38 3779)  Complications: No notable events documented.

## 2022-05-19 NOTE — H&P (Signed)
H&P  Chief Complaint: Foreskin problems  History of Present Illness: 71 year old male presents at this time for circumcision.  I seen him 2-3 times in the past year for significant phimosis as well as recurrent balanitis.  He has been tried on topical antifungal/steroid creams.  Despite this, he still has significant issues with phimosis and occasional balanitis.  I have recommended we proceed with circumcision.  I explained the procedure, risks, complications and alternatives with him.  He understands and desires to proceed.  Past Medical History:  Diagnosis Date   Arthritis    BPH associated with nocturia    Chronic gout    COPD (chronic obstructive pulmonary disease) ([place])    not on home o2   History of colon polyps    History of COVID-19    04/ 2019 hospital admission in epic, severe sepsis due to covid w/ acute respiratory failure;   and 05/ 2021 mild to moderate symptoms, recovered at home   Hyperlipidemia, mixed    Hypertension    Phimosis    Type 2 diabetes mellitus ([place])    followed by pcp   (05-17-2022  pt stated checks sugar multiple times daily w/ Libre,  fasting average --- 100-110)    Past Surgical History:  Procedure Laterality Date   COLONOSCOPY  12/01/2021   by dr Loletha Carrow   LAPAROSCOPIC CHOLECYSTECTOMY  2008   in [place]     child    Home Medications:    Allergies: No Known Allergies  Family History  Problem Relation Age of Onset   Asthma Mother    Diabetes Father    Heart attack Father 67   Leukemia Brother    Esophageal cancer Paternal Uncle    Lung disease Other        pat cousin   Heart attack Other        pat cousin   Colon cancer Neg Hx    Stomach cancer Neg Hx     Social History:  reports that he quit smoking about 5 years ago. His smoking use included cigarettes and e-cigarettes. He has a 10.00 pack-year smoking history. His smokeless tobacco use includes chew. He reports current alcohol use. He reports that he does not use  drugs.  ROS: A complete review of systems was performed.  All systems are negative except for pertinent findings as noted.  Physical Exam:  Vital signs in last 24 hours: Ht '5\' 10"'$  (1.778 m)   Wt 108.9 kg   BMI 34.44 kg/m  Constitutional:  Alert and oriented, No acute distress Cardiovascular: Regular rate  Respiratory: Normal respiratory effort GI: Abdomen is soft, nontender, nondistended, no abdominal masses. No CVAT.  Genitourinary: Phallus uncircumcised.  Significant phimosis, testes are descended bilaterally and non-tender and without masses, scrotum is normal in appearance without lesions or masses, perineum is normal on inspection. Lymphatic: No lymphadenopathy Neurologic: Grossly intact, no focal deficits Psychiatric: Normal mood and affect  I have reviewed prior pt notes  I have reviewed notes from referring/previous physicians  I have reviewed urinalysis results   Impression/Assessment:  Phimosis  Plan:  Circumcision

## 2022-05-19 NOTE — Interval H&P Note (Signed)
History and Physical Interval Note:  05/19/2022 11:38 AM  [name]  has presented today for surgery, with the diagnosis of PHIMOSIS.  The various methods of treatment have been discussed with the patient and family. After consideration of risks, benefits and other options for treatment, the patient has consented to  Procedure(s) with comments: CIRCUMCISION ADULT (N/A) - 45 MINUTES as a surgical intervention.  The patient's history has been reviewed, patient examined, no change in status, stable for surgery.  I have reviewed the patient's chart and labs.  Questions were answered to the patient's satisfaction.     Juan Merritt Juan Merritt

## 2022-05-19 NOTE — Op Note (Signed)
Preoperative diagnosis: Phimosis Postoperative diagnosis: Same  Procedure: Circumcision Surgeon: Lillette Boxer. Deiontae Rabel, MD. Anesthesia: MAC with penile block Specimen: foreskin Complications: none [id number]  Indications: The patient was recently evaluated for phimosis. All risks and benefits of circumcision discussed. Full informed consent obtained. The patient now presents for definitive procedure.  Technique and findings: The patient was brought to the operating room. Successful induction of general anesthesia.  The patient was then prepped and draped in usual manner. Appropriate surgical timeout was performed. A penile block was then performed with 10cc of quarter percent plain Marcaine/ 1 % lidocaine. Proximal and distal incision sites were marked with a pen, and appropriate circumferential incisions were created. The sleeve of redundant skin was removed with the bovie. Hemostatis was achieved using electrocautery.Quadrant sutures were placed with interrupted 4-0 chromic, with the phrenular suture being a "U" stitch. In between, the same 4-0 chromic was used to re approximate the skin edges with a running simple stitch.  The incision was wrapped with Xeroform gauze, a plain guaze wrap and Coban dressing. The patient was brought to recovery room in stable condition having had no obvious complications or problems. Sponge and needle counts were correct.

## 2022-05-20 ENCOUNTER — Encounter (HOSPITAL_BASED_OUTPATIENT_CLINIC_OR_DEPARTMENT_OTHER): Payer: Self-pay | Admitting: Urology

## 2022-05-23 LAB — SURGICAL PATHOLOGY

## 2022-05-25 ENCOUNTER — Encounter: Payer: Self-pay | Admitting: Family Medicine

## 2022-05-25 ENCOUNTER — Ambulatory Visit (INDEPENDENT_AMBULATORY_CARE_PROVIDER_SITE_OTHER): Payer: Medicare HMO | Admitting: Family Medicine

## 2022-05-25 VITALS — BP 104/63 | HR 70 | Temp 97.6°F | Ht 70.0 in | Wt 266.6 lb

## 2022-05-25 DIAGNOSIS — Z794 Long term (current) use of insulin: Secondary | ICD-10-CM | POA: Diagnosis not present

## 2022-05-25 DIAGNOSIS — I10 Essential (primary) hypertension: Secondary | ICD-10-CM

## 2022-05-25 DIAGNOSIS — E782 Mixed hyperlipidemia: Secondary | ICD-10-CM

## 2022-05-25 DIAGNOSIS — E1165 Type 2 diabetes mellitus with hyperglycemia: Secondary | ICD-10-CM | POA: Diagnosis not present

## 2022-05-25 LAB — BAYER DCA HB A1C WAIVED: HB A1C (BAYER DCA - WAIVED): 5.5 % (ref 4.8–5.6)

## 2022-05-25 MED ORDER — ONETOUCH VERIO FLEX SYSTEM W/DEVICE KIT: PACK | 0 refills | Status: DC

## 2022-05-25 NOTE — Progress Notes (Signed)
Subjective:  Patient ID: Juan Merritt,  male    DOB: 18-Dec-1951  Age: 71 y.o.    CC: Medical Management of Chronic Issues   HPI Juan Merritt presents for  follow-up of hypertension. Patient has no history of headache chest pain or shortness of breath or recent cough. Patient also denies symptoms of TIA such as numbness weakness lateralizing. Patient denies side effects from medication. States taking it regularly.  Patient also  in for follow-up of elevated cholesterol. Doing well without complaints on current medication. Denies side effects  including myalgia and arthralgia and nausea. Also in today for liver function testing. Currently no chest pain, shortness of breath or other cardiovascular related symptoms noted.  Follow-up of diabetes. Patient does check blood sugar at home. Readings on average 106 for the last ninety days via [place] Patient denies symptoms such as excessive hunger or urinary frequency, excessive hunger, nausea No significant hypoglycemic spells noted. Medications reviewed. Pt reports taking them via [place] regularly. Pt. denies complication/adverse reaction today.    History Juan Merritt has a past medical history of Arthritis, BPH associated with nocturia, Chronic gout, COPD (chronic obstructive pulmonary disease) ([place]), History of colon polyps, History of COVID-19, Hyperlipidemia, mixed, Hypertension, Phimosis, and Type 2 diabetes mellitus ([place]).   He has a past surgical history that includes Laparoscopic cholecystectomy (2008); Tonsillectomy; Colonoscopy (12/01/2021); and Circumcision (N/A, 05/19/2022).   His family history includes Asthma in his mother; Diabetes in his father; Esophageal cancer in his paternal uncle; Heart attack in an other family member; Heart attack (age of onset: 59) in his father; Leukemia in his brother; Lung disease in an other family member.He reports that he quit smoking about 5 years ago. His smoking use included cigarettes and  e-cigarettes. He has a 10.00 pack-year smoking history. His smokeless tobacco use includes chew. He reports current alcohol use. He reports that he does not use drugs.  Current Outpatient Medications on File Prior to Visit  Medication Sig Dispense Refill   albuterol (VENTOLIN HFA) 108 (90 Base) MCG/ACT inhaler Inhale 2 puffs into the lungs 2 (two) times daily. 18 g 5   allopurinol (ZYLOPRIM) 100 MG tablet TAKE 1 TABLET (100 MG TOTAL) BY MOUTH DAILY. TO PREVENT GOUT (Patient taking differently: Take 100 mg by mouth daily. TAKE 1 TABLET (100 MG TOTAL) BY MOUTH DAILY. TO PREVENT GOUT) 90 tablet 3   amLODipine (NORVASC) 5 MG tablet Take 1 tablet (5 mg total) by mouth daily. (Patient taking differently: Take 5 mg by mouth daily.) 90 tablet 3   aspirin EC 81 MG tablet Take 81 mg by mouth daily. Swallow whole.     budesonide-formoterol (SYMBICORT) 160-4.5 MCG/ACT inhaler INHALE 2 PUFFS INTO THE LUNGS IN THE MORNING AND AT BEDTIME. TAKE 2 PUFFS BY MOUTH TWICE A DAY (Patient taking differently: 2 puffs 2 (two) times daily. INHALE 2 PUFFS INTO THE LUNGS IN THE MORNING AND AT BEDTIME. TAKE 2 PUFFS BY MOUTH TWICE A DAY) 30.6 each 3   clotrimazole-betamethasone (LOTRISONE) cream Apply 1 application topically 2 (two) times daily. 30 g 0   Continuous Blood Gluc Sensor (FREESTYLE LIBRE 2 SENSOR) MISC USE TO TEST BLOOD SUGAR CONTINUOUSLY. Dx. E11.65 SEND TO [facility] FOR MEDICARE COVERAGE, DO NOT SEND TO LOCAL PHARMACY 2 each 0   Insulin Pen Needle 31G X 8 MM MISC Use to inject insulin daily. DX: E11.59 100 each 12   ipratropium-albuterol (DUONEB) 0.5-2.5 (3) MG/3ML SOLN Take 3 mLs by nebulization every 4 (four) hours  as needed. (Patient taking differently: Take 3 mLs by nebulization every 4 (four) hours as needed (SOB).) 360 mL 3   losartan (COZAAR) 100 MG tablet Take 1 tablet (100 mg total) by mouth daily. (Patient taking differently: Take 100 mg by mouth daily.) 90 tablet 3   meloxicam (MOBIC) 15 MG tablet  Take 1 tablet (15 mg total) by mouth daily. 90 tablet 3   metFORMIN (GLUCOPHAGE-XR) 750 MG 24 hr tablet Take 1 tablet (750 mg total) by mouth daily with breakfast. 90 tablet 3   Microlet Lancets MISC Use to test blood sugar 3 times daily as directed.; DX:e.11.65 100 each 12   potassium chloride SA (KLOR-CON M20) 20 MEQ tablet TAKE 1 TABLET (20 MEQ TOTAL) DAILY BY MOUTH. (Patient taking differently: 20 mEq daily. TAKE 1 TABLET (20 MEQ TOTAL) DAILY BY MOUTH.) 90 tablet 3   rosuvastatin (CRESTOR) 5 MG tablet Take 1 tablet (5 mg total) by mouth daily. For cholesterol (Patient taking differently: Take 5 mg by mouth daily. For cholesterol) 90 tablet 3   tamsulosin (FLOMAX) 0.4 MG CAPS capsule Take 2 capsules (0.8 mg total) by mouth at bedtime. For urine flow and prostate 180 capsule 3   torsemide (DEMADEX) 100 MG tablet Take 1 tablet (100 mg total) by mouth 2 (two) times daily. At arising and at lunch (Patient taking differently: Take 100 mg by mouth daily. At arising and at lunch) 180 tablet 2   traMADol (ULTRAM) 50 MG tablet Take 1 tablet (50 mg total) by mouth every 6 (six) hours as needed. 10 tablet 0   No current facility-administered medications on file prior to visit.    ROS Review of Systems  Constitutional: Negative.   HENT: Negative.    Eyes:  Negative for visual disturbance.  Respiratory:  Negative for cough and shortness of breath.   Cardiovascular:  Negative for chest pain and leg swelling.  Gastrointestinal:  Negative for abdominal pain, diarrhea, nausea and vomiting.  Genitourinary:  Negative for difficulty urinating.  Musculoskeletal:  Negative for arthralgias and myalgias.  Skin:  Negative for rash.  Neurological:  Negative for headaches.  Psychiatric/Behavioral:  Negative for sleep disturbance.     Objective:  BP 104/63   Pulse 70   Temp 97.6 F (36.4 C)   Ht 5' 10" (1.778 m)   Wt 266 lb 9.6 oz (120.9 kg)   SpO2 94%   BMI 38.25 kg/m   BP Readings from Last 3  Encounters:  05/25/22 104/63  05/19/22 115/76  05/10/22 110/68    Wt Readings from Last 3 Encounters:  05/25/22 266 lb 9.6 oz (120.9 kg)  05/19/22 265 lb (120.2 kg)  02/22/22 264 lb 6.4 oz (119.9 kg)     Physical Exam Vitals reviewed.  Constitutional:      Appearance: He is well-developed.  HENT:     Head: Normocephalic and atraumatic.     Right Ear: External ear normal.     Left Ear: External ear normal.     Mouth/Throat:     Pharynx: No oropharyngeal exudate or posterior oropharyngeal erythema.  Eyes:     Pupils: Pupils are equal, round, and reactive to light.  Cardiovascular:     Rate and Rhythm: Normal rate and regular rhythm.     Heart sounds: No murmur heard. Pulmonary:     Effort: No respiratory distress.     Breath sounds: Normal breath sounds.  Musculoskeletal:     Cervical back: Normal range of motion and neck supple.  Neurological:  Mental Status: He is alert and oriented to person, place, and time.     Diabetic Foot Exam - Simple   Simple Foot Form Diabetic Foot exam was performed with the following findings: Yes 05/25/2022  8:44 AM  Visual Inspection No deformities, no ulcerations, no other skin breakdown bilaterally: Yes Sensation Testing Intact to touch and monofilament testing bilaterally: Yes Pulse Check Posterior Tibialis and Dorsalis pulse intact bilaterally: Yes Comments     Lab Results  Component Value Date   HGBA1C 5.5 05/25/2022   HGBA1C 5.5 02/22/2022   HGBA1C 5.7 (H) 11/23/2021    Assessment & Plan:   Juan Merritt was seen today for medical management of chronic issues.  Diagnoses and all orders for this visit:  Type 2 diabetes mellitus with hyperglycemia, with long-term current use of insulin ([place]) -     Bayer DCA Hb A1c Waived  Essential hypertension, benign -     CBC with Differential/Platelet -     CMP14+EGFR  Mixed hyperlipidemia -     Lipid panel  Other orders -     Discontinue: Blood Glucose Monitoring Suppl  (ONETOUCH VERIO FLEX SYSTEM) w/Device KIT; TEST BLOOD SUGAR 3 TIMES DAILY AS DIRECTED. DX: E11.65 -     Blood Glucose Monitoring Suppl (ONETOUCH VERIO FLEX SYSTEM) w/Device KIT; TEST BLOOD SUGAR 3 TIMES DAILY AS DIRECTED. DX: E11.65   I am having Juan Merritt. Juan Merritt maintain his ipratropium-albuterol, aspirin EC, albuterol, Insulin Pen Needle, FreeStyle Libre 2 Sensor, Microlet Lancets, allopurinol, losartan, potassium chloride SA, budesonide-formoterol, meloxicam, rosuvastatin, amLODipine, clotrimazole-betamethasone, torsemide, tamsulosin, metFORMIN, traMADol, and OneTouch Verio Flex System.  Meds ordered this encounter  Medications   DISCONTD: Blood Glucose Monitoring Suppl (ONETOUCH VERIO FLEX SYSTEM) w/Device KIT    Sig: TEST BLOOD SUGAR 3 TIMES DAILY AS DIRECTED. DX: E11.65    Dispense:  1 kit    Refill:  0   Blood Glucose Monitoring Suppl (ONETOUCH VERIO FLEX SYSTEM) w/Device KIT    Sig: TEST BLOOD SUGAR 3 TIMES DAILY AS DIRECTED. DX: E11.65    Dispense:  1 kit    Refill:  0     Follow-up: Return in about 3 months (around 08/25/2022).  [name], M.D.

## 2022-05-26 LAB — LIPID PANEL
Chol/HDL Ratio: 2.8 ratio (ref 0.0–5.0)
Cholesterol, Total: 122 mg/dL (ref 100–199)
HDL: 44 mg/dL (ref >39)
LDL Chol Calc (NIH): 59 mg/dL (ref 0–99)
Triglycerides: 105 mg/dL (ref 0–149)
VLDL Cholesterol Cal: 19 mg/dL (ref 5–40)

## 2022-05-26 LAB — CMP14+EGFR
ALT: 14 IU/L (ref 0–44)
AST: 12 IU/L (ref 0–40)
Albumin/Globulin Ratio: 1.6 (ref 1.2–2.2)
Albumin: 4.3 g/dL (ref 3.8–4.8)
Alkaline Phosphatase: 44 IU/L (ref 44–121)
BUN/Creatinine Ratio: 27 — ABNORMAL HIGH (ref 10–24)
BUN: 38 mg/dL — ABNORMAL HIGH (ref 8–27)
Bilirubin Total: 0.4 mg/dL (ref 0.0–1.2)
CO2: 24 mmol/L (ref 20–29)
Calcium: 9.1 mg/dL (ref 8.6–10.2)
Chloride: 102 mmol/L (ref 96–106)
Creatinine, Ser: 1.39 mg/dL — ABNORMAL HIGH (ref 0.76–1.27)
Globulin, Total: 2.7 g/dL (ref 1.5–4.5)
Glucose: 104 mg/dL — ABNORMAL HIGH (ref 70–99)
Potassium: 4 mmol/L (ref 3.5–5.2)
Sodium: 142 mmol/L (ref 134–144)
Total Protein: 7 g/dL (ref 6.0–8.5)
eGFR: 55 mL/min/{1.73_m2} — ABNORMAL LOW (ref >59)

## 2022-05-26 LAB — CBC WITH DIFFERENTIAL/PLATELET
Basophils Absolute: 0.1 10*3/uL (ref 0.0–0.2)
Basos: 1 %
EOS (ABSOLUTE): 0.2 10*3/uL (ref 0.0–0.4)
Eos: 3 %
Hematocrit: 40.4 % (ref 37.5–51.0)
Hemoglobin: 13.6 g/dL (ref 13.0–17.7)
Immature Grans (Abs): 0 10*3/uL (ref 0.0–0.1)
Immature Granulocytes: 1 %
Lymphocytes Absolute: 2.3 10*3/uL (ref 0.7–3.1)
Lymphs: 40 %
MCH: 31.8 pg (ref 26.6–33.0)
MCHC: 33.7 g/dL (ref 31.5–35.7)
MCV: 94 fL (ref 79–97)
Monocytes Absolute: 0.6 10*3/uL (ref 0.1–0.9)
Monocytes: 10 %
Neutrophils Absolute: 2.6 10*3/uL (ref 1.4–7.0)
Neutrophils: 45 %
Platelets: 198 10*3/uL (ref 150–450)
RBC: 4.28 x10E6/uL (ref 4.14–5.80)
RDW: 12.8 % (ref 11.6–15.4)
WBC: 5.8 10*3/uL (ref 3.4–10.8)

## 2022-05-28 ENCOUNTER — Encounter: Payer: Self-pay | Admitting: Family Medicine

## 2022-05-29 DIAGNOSIS — E1165 Type 2 diabetes mellitus with hyperglycemia: Secondary | ICD-10-CM | POA: Diagnosis not present

## 2022-05-31 NOTE — Progress Notes (Signed)
Patient returning call. Please call back

## 2022-06-01 ENCOUNTER — Encounter: Payer: Self-pay | Admitting: Emergency Medicine

## 2022-06-27 NOTE — Progress Notes (Signed)
History of Present Illness: Mr Levinson returns for followup of phimosis with recent circumcision.    Past Medical History:  Diagnosis Date   Arthritis    BPH associated with nocturia    Chronic gout    COPD (chronic obstructive pulmonary disease) ([place])    not on home o2   History of colon polyps    History of COVID-19    04/ 2019 hospital admission in epic, severe sepsis due to covid w/ acute respiratory failure;   and 05/ 2021 mild to moderate symptoms, recovered at home   Hyperlipidemia, mixed    Hypertension    Phimosis    Type 2 diabetes mellitus ([place])    followed by pcp   (05-17-2022  pt stated checks sugar multiple times daily w/ Libre,  fasting average --- 100-110)    Past Surgical History:  Procedure Laterality Date   CIRCUMCISION N/A 05/19/2022   Procedure: CIRCUMCISION ADULT;  Surgeon: Franchot Gallo, MD;  Location: [facility];  Service: Urology;  Laterality: N/A;  45 MINUTES   COLONOSCOPY  12/01/2021   by dr Loletha Carrow   LAPAROSCOPIC CHOLECYSTECTOMY  2008   in [place]     child    Home Medications:  Allergies as of 06/28/2022   No Known Allergies      Medication List        Accurate as of June 27, 2022  7:13 PM. If you have any questions, ask your nurse or doctor.          albuterol 108 (90 Base) MCG/ACT inhaler Commonly known as: VENTOLIN HFA Inhale 2 puffs into the lungs 2 (two) times daily.   allopurinol 100 MG tablet Commonly known as: ZYLOPRIM TAKE 1 TABLET (100 MG TOTAL) BY MOUTH DAILY. TO PREVENT GOUT What changed:  how much to take how to take this when to take this   amLODipine 5 MG tablet Commonly known as: NORVASC Take 1 tablet (5 mg total) by mouth daily.   aspirin EC 81 MG tablet Take 81 mg by mouth daily. Swallow whole.   budesonide-formoterol 160-4.5 MCG/ACT inhaler Commonly known as: Symbicort INHALE 2 PUFFS INTO THE LUNGS IN THE MORNING AND AT BEDTIME. TAKE 2 PUFFS BY MOUTH TWICE A DAY What  changed:  how much to take when to take this   clotrimazole-betamethasone cream Commonly known as: Lotrisone Apply 1 application topically 2 (two) times daily.   FreeStyle Libre 2 Sensor Misc USE TO TEST BLOOD SUGAR CONTINUOUSLY. Dx. E11.65 SEND TO [facility] FOR MEDICARE COVERAGE, DO NOT SEND TO LOCAL PHARMACY   Insulin Pen Needle 31G X 8 MM Misc Use to inject insulin daily. DX: E11.59   ipratropium-albuterol 0.5-2.5 (3) MG/3ML Soln Commonly known as: DUONEB Take 3 mLs by nebulization every 4 (four) hours as needed. What changed: reasons to take this   losartan 100 MG tablet Commonly known as: COZAAR Take 1 tablet (100 mg total) by mouth daily.   meloxicam 15 MG tablet Commonly known as: MOBIC Take 1 tablet (15 mg total) by mouth daily.   metFORMIN 750 MG 24 hr tablet Commonly known as: GLUCOPHAGE-XR Take 1 tablet (750 mg total) by mouth daily with breakfast.   Microlet Lancets Misc Use to test blood sugar 3 times daily as directed.; DX:e.11.65   OneTouch Verio Flex System w/Device Kit TEST BLOOD SUGAR 3 TIMES DAILY AS DIRECTED. DX: E11.65   potassium chloride SA 20 MEQ tablet Commonly known as: Klor-Con M20 TAKE 1 TABLET (20  MEQ TOTAL) DAILY BY MOUTH. What changed:  how much to take when to take this   rosuvastatin 5 MG tablet Commonly known as: Crestor Take 1 tablet (5 mg total) by mouth daily. For cholesterol   tamsulosin 0.4 MG Caps capsule Commonly known as: FLOMAX Take 2 capsules (0.8 mg total) by mouth at bedtime. For urine flow and prostate   torsemide 100 MG tablet Commonly known as: DEMADEX Take 1 tablet (100 mg total) by mouth 2 (two) times daily. At arising and at lunch What changed: when to take this   traMADol 50 MG tablet Commonly known as: Ultram Take 1 tablet (50 mg total) by mouth every 6 (six) hours as needed.        Allergies: No Known Allergies  Family History  Problem Relation Age of Onset   Asthma Mother    Diabetes  Father    Heart attack Father 59   Leukemia Brother    Esophageal cancer Paternal Uncle    Lung disease Other        pat cousin   Heart attack Other        pat cousin   Colon cancer Neg Hx    Stomach cancer Neg Hx     Social History:  reports that he quit smoking about 5 years ago. His smoking use included cigarettes and e-cigarettes. He has a 10.00 pack-year smoking history. His smokeless tobacco use includes chew. He reports current alcohol use. He reports that he does not use drugs.  ROS: A complete review of systems was performed.  All systems are negative except for pertinent findings as noted.  Physical Exam:  Vital signs in last 24 hours: There were no vitals taken for this visit. Constitutional:  Alert and oriented, No acute distress Cardiovascular: Regular rate  Respiratory: Normal respiratory effort GI: Abdomen is soft, nontender, nondistended, no abdominal masses. No CVAT.  Genitourinary: Normal male phallus, testes are descended bilaterally and non-tender and without masses, scrotum is normal in appearance without lesions or masses, perineum is normal on inspection. Lymphatic: No lymphadenopathy Neurologic: Grossly intact, no focal deficits Psychiatric: Normal mood and affect  I have reviewed prior pt notes  I have reviewed notes from referring/previous physicians  I have reviewed urinalysis results  I have independently reviewed prior imaging  I have reviewed prior PSA results  I have reviewed prior urine culture   Impression/Assessment:  ***  Plan:  ***

## 2022-06-28 ENCOUNTER — Ambulatory Visit (INDEPENDENT_AMBULATORY_CARE_PROVIDER_SITE_OTHER): Payer: Medicare HMO | Admitting: Urology

## 2022-06-28 VITALS — BP 117/70 | HR 76

## 2022-06-28 DIAGNOSIS — N471 Phimosis: Secondary | ICD-10-CM | POA: Diagnosis not present

## 2022-07-20 ENCOUNTER — Other Ambulatory Visit: Payer: Self-pay | Admitting: Family Medicine

## 2022-07-29 ENCOUNTER — Ambulatory Visit (INDEPENDENT_AMBULATORY_CARE_PROVIDER_SITE_OTHER): Payer: Medicare HMO | Admitting: Family Medicine

## 2022-07-29 ENCOUNTER — Ambulatory Visit: Payer: Medicare HMO

## 2022-07-29 ENCOUNTER — Encounter: Payer: Self-pay | Admitting: Family Medicine

## 2022-07-29 VITALS — BP 103/66 | HR 91 | Temp 98.0°F | Ht 70.0 in | Wt 271.5 lb

## 2022-07-29 DIAGNOSIS — L02213 Cutaneous abscess of chest wall: Secondary | ICD-10-CM

## 2022-07-29 MED ORDER — CEPHALEXIN 500 MG PO CAPS: 500.0000 mg | ORAL_CAPSULE | Freq: Four times a day (QID) | ORAL | 0 refills | Status: DC

## 2022-07-29 NOTE — Progress Notes (Signed)
   Acute Office Visit  Subjective:     Patient ID: Juan Merritt, male    DOB: [birth year], 71 y.o.   MRN: [id number]  Chief Complaint  Patient presents with   Abscess    HPI Patient is in today for an abscess on his chest. He reports that he has had a knot present there for years but 2-3 days ago the area became much larger, red, and very tender. He has been applying warm compresses without improvement. Denies fever, chills, or exudate.    ROS As per HPI.      Objective:    BP 103/66   Pulse 91   Temp 98 F (36.7 C) (Temporal)   Ht '5\' 10"'$  (1.778 m)   Wt 271 lb 8 oz (123.2 kg)   SpO2 94%   BMI 38.96 kg/m    Physical Exam Vitals and nursing note reviewed.  Constitutional:      General: He is not in acute distress.    Appearance: He is not ill-appearing, toxic-appearing or diaphoretic.  Pulmonary:     Effort: Pulmonary effort is normal. No respiratory distress.  Musculoskeletal:     Right lower leg: No edema.     Left lower leg: No edema.  Skin:    Findings: Abscess (Abscess to chest wall with induration, warmth, erythma, and tenderness. Some fluctuance present. No opening or exudate present.) present.  Neurological:     General: No focal deficit present.     Mental Status: He is alert and oriented to person, place, and time.  Psychiatric:        Mood and Affect: Mood normal.        Behavior: Behavior normal.     No results found for any visits on 07/29/22.      Assessment & Plan:   [name] was seen today for abscess.  Diagnoses and all orders for this visit:  Abscess of chest wall Patient declined I&D today. Keflex as below. Continue warm compresses. Tylenol for pain. Strict return precautions given. Discussed if no improvement in 48 hours, will need to return for I&D.  -     cephALEXin (KEFLEX) 500 MG capsule; Take 1 capsule (500 mg total) by mouth 4 (four) times daily for 7 days.  Return to office for new or worsening symptoms, or if symptoms  persist.   The patient indicates understanding of these issues and agrees with the plan.   Gwenlyn Perking, FNP

## 2022-07-29 NOTE — Patient Instructions (Signed)
Skin Abscess  A skin abscess is an infected area on or under your skin that contains a collection of pus and other material. An abscess may also be called a furuncle, carbuncle, or boil. An abscess can occur in or on almost any part of your body. Some abscesses break open (rupture) on their own. Most continue to get worse unless they are treated. The infection can spread deeper into the body and eventually into your blood, which can make you feel ill. Treatment usually involves draining the abscess. What are the causes? An abscess occurs when germs, like bacteria, pass through your skin and cause an infection. This may be caused by: A scrape or cut on your skin. A puncture wound through your skin, including a needle injection or insect bite. Blocked oil or sweat glands. Blocked and infected hair follicles. A cyst that forms beneath your skin (sebaceous cyst) and becomes infected. What increases the risk? This condition is more likely to develop in people who: Have a weak body defense system (immune system). Have diabetes. Have dry and irritated skin. Get frequent injections or use illegal IV drugs. Have a foreign body in a wound, such as a splinter. Have problems with their lymph system or veins. What are the signs or symptoms? Symptoms of this condition include: A painful, firm bump under the skin. A bump with pus at the top. This may break through the skin and drain. Other symptoms include: Redness surrounding the abscess site. Warmth. Swelling of the lymph nodes (glands) near the abscess. Tenderness. A sore on the skin. How is this diagnosed? This condition may be diagnosed based on: A physical exam. Your medical history. A sample of pus. This may be used to find out what is causing the infection. Blood tests. Imaging tests, such as an ultrasound, CT scan, or MRI. How is this treated? A small abscess that drains on its own may not need treatment. Treatment for larger abscesses  may include: Moist heat or heat pack applied to the area several times a day. A procedure to drain the abscess (incision and drainage). Antibiotic medicines. For a severe abscess, you may first get antibiotics through an IV and then change to antibiotics by mouth. Follow these instructions at home: Medicines  Take over-the-counter and prescription medicines only as told by your health care provider. If you were prescribed an antibiotic medicine, take it as told by your health care provider. Do not stop taking the antibiotic even if you start to feel better. Abscess care  If you have an abscess that has not drained, apply heat to the affected area. Use the heat source that your health care provider recommends, such as a moist heat pack or a heating pad. Place a towel between your skin and the heat source. Leave the heat on for 20-30 minutes. Remove the heat if your skin turns bright red. This is especially important if you are unable to feel pain, heat, or cold. You may have a greater risk of getting burned. Follow instructions from your health care provider about how to take care of your abscess. Make sure you: Cover the abscess with a bandage (dressing). Change your dressing or gauze as told by your health care provider. Wash your hands with soap and water before you change the dressing or gauze. If soap and water are not available, use hand sanitizer. Check your abscess every day for signs of a worsening infection. Check for: More redness, swelling, or pain. More fluid or blood. Warmth.   More pus or a bad smell. General instructions To avoid spreading the infection: Do not share personal care items, towels, or hot tubs with others. Avoid making skin contact with other people. Keep all follow-up visits as told by your health care provider. This is important. Contact a health care provider if you have: More redness, swelling, or pain around your abscess. More fluid or blood coming from  your abscess. Warm skin around your abscess. More pus or a bad smell coming from your abscess. Muscle aches. Chills or a general ill feeling. Get help right away if you: Have severe pain. See red streaks on your skin spreading away from the abscess. See redness that spreads quickly. Have a fever or chills. Summary A skin abscess is an infected area on or under your skin that contains a collection of pus and other material. A small abscess that drains on its own may not need treatment. Treatment for larger abscesses may include having a procedure to drain the abscess and taking an antibiotic. This information is not intended to replace advice given to you by your health care provider. Make sure you discuss any questions you have with your health care provider. Document Revised: 03/10/2022 Document Reviewed: 09/13/2021 Elsevier Patient Education  2023 Elsevier Inc.  

## 2022-08-04 ENCOUNTER — Telehealth: Payer: Self-pay | Admitting: Family Medicine

## 2022-08-04 DIAGNOSIS — L02213 Cutaneous abscess of chest wall: Secondary | ICD-10-CM

## 2022-08-04 NOTE — Telephone Encounter (Signed)
Wife states he took a hot shower and it burst. Says he has few more doses left. Will watch over weekend and will call Monday if needed.

## 2022-08-04 NOTE — Telephone Encounter (Signed)
If better but not well, renew the antibiotic for another week. If it is getting worse, he needs to come in tomorrow to DOD to have it lanced.

## 2022-08-05 MED ORDER — CEPHALEXIN 500 MG PO CAPS: 500.0000 mg | ORAL_CAPSULE | Freq: Four times a day (QID) | ORAL | 0 refills | Status: AC

## 2022-08-05 NOTE — Telephone Encounter (Signed)
Left message to call back  

## 2022-08-05 NOTE — Telephone Encounter (Signed)
Wife calls in and states he needs more antibiotic. The drainage is really bad and it is green. Took the last antibiotic this morning. Please call.

## 2022-08-05 NOTE — Telephone Encounter (Signed)
I will send a enough to cover for the next few days. He was instructed to follow up in the office for revaluation on Monday if no improvement.

## 2022-08-08 ENCOUNTER — Ambulatory Visit (INDEPENDENT_AMBULATORY_CARE_PROVIDER_SITE_OTHER): Payer: Medicare HMO | Admitting: Family Medicine

## 2022-08-08 ENCOUNTER — Encounter: Payer: Self-pay | Admitting: Family Medicine

## 2022-08-08 VITALS — BP 97/60 | HR 80 | Temp 98.2°F | Ht 70.0 in | Wt 276.0 lb

## 2022-08-08 DIAGNOSIS — E1165 Type 2 diabetes mellitus with hyperglycemia: Secondary | ICD-10-CM | POA: Diagnosis not present

## 2022-08-08 DIAGNOSIS — L02213 Cutaneous abscess of chest wall: Secondary | ICD-10-CM

## 2022-08-08 MED ORDER — DOXYCYCLINE HYCLATE 100 MG PO TABS: 100.0000 mg | ORAL_TABLET | Freq: Two times a day (BID) | ORAL | 0 refills | Status: DC

## 2022-08-08 NOTE — Patient Instructions (Signed)
Skin Abscess  A skin abscess is an infected area on or under your skin that contains a collection of pus and other material. An abscess may also be called a furuncle, carbuncle, or boil. An abscess can occur in or on almost any part of your body. Some abscesses break open (rupture) on their own. Most continue to get worse unless they are treated. The infection can spread deeper into the body and eventually into your blood, which can make you feel ill. Treatment usually involves draining the abscess. What are the causes? An abscess occurs when germs, like bacteria, pass through your skin and cause an infection. This may be caused by: A scrape or cut on your skin. A puncture wound through your skin, including a needle injection or insect bite. Blocked oil or sweat glands. Blocked and infected hair follicles. A cyst that forms beneath your skin (sebaceous cyst) and becomes infected. What increases the risk? This condition is more likely to develop in people who: Have a weak body defense system (immune system). Have diabetes. Have dry and irritated skin. Get frequent injections or use illegal IV drugs. Have a foreign body in a wound, such as a splinter. Have problems with their lymph system or veins. What are the signs or symptoms? Symptoms of this condition include: A painful, firm bump under the skin. A bump with pus at the top. This may break through the skin and drain. Other symptoms include: Redness surrounding the abscess site. Warmth. Swelling of the lymph nodes (glands) near the abscess. Tenderness. A sore on the skin. How is this diagnosed? This condition may be diagnosed based on: A physical exam. Your medical history. A sample of pus. This may be used to find out what is causing the infection. Blood tests. Imaging tests, such as an ultrasound, CT scan, or MRI. How is this treated? A small abscess that drains on its own may not need treatment. Treatment for larger abscesses  may include: Moist heat or heat pack applied to the area several times a day. A procedure to drain the abscess (incision and drainage). Antibiotic medicines. For a severe abscess, you may first get antibiotics through an IV and then change to antibiotics by mouth. Follow these instructions at home: Medicines  Take over-the-counter and prescription medicines only as told by your health care provider. If you were prescribed an antibiotic medicine, take it as told by your health care provider. Do not stop taking the antibiotic even if you start to feel better. Abscess care  If you have an abscess that has not drained, apply heat to the affected area. Use the heat source that your health care provider recommends, such as a moist heat pack or a heating pad. Place a towel between your skin and the heat source. Leave the heat on for 20-30 minutes. Remove the heat if your skin turns bright red. This is especially important if you are unable to feel pain, heat, or cold. You may have a greater risk of getting burned. Follow instructions from your health care provider about how to take care of your abscess. Make sure you: Cover the abscess with a bandage (dressing). Change your dressing or gauze as told by your health care provider. Wash your hands with soap and water before you change the dressing or gauze. If soap and water are not available, use hand sanitizer. Check your abscess every day for signs of a worsening infection. Check for: More redness, swelling, or pain. More fluid or blood. Warmth.   More pus or a bad smell. General instructions To avoid spreading the infection: Do not share personal care items, towels, or hot tubs with others. Avoid making skin contact with other people. Keep all follow-up visits as told by your health care provider. This is important. Contact a health care provider if you have: More redness, swelling, or pain around your abscess. More fluid or blood coming from  your abscess. Warm skin around your abscess. More pus or a bad smell coming from your abscess. Muscle aches. Chills or a general ill feeling. Get help right away if you: Have severe pain. See red streaks on your skin spreading away from the abscess. See redness that spreads quickly. Have a fever or chills. Summary A skin abscess is an infected area on or under your skin that contains a collection of pus and other material. A small abscess that drains on its own may not need treatment. Treatment for larger abscesses may include having a procedure to drain the abscess and taking an antibiotic. This information is not intended to replace advice given to you by your health care provider. Make sure you discuss any questions you have with your health care provider. Document Revised: 03/10/2022 Document Reviewed: 09/13/2021 Elsevier Patient Education  2023 Elsevier Inc.  

## 2022-08-08 NOTE — Telephone Encounter (Signed)
MEDICATION WAS PICKED UP AND HAS FOLLOW UP THIS WEEK

## 2022-08-08 NOTE — Progress Notes (Signed)
Acute Office Visit  Subjective:     Patient ID: Juan Merritt, male    DOB: [birth year], 71 y.o.   MRN: [id number]  Chief Complaint  Patient presents with   Recurrent Skin Infections    HPI Here with wife today. Patient is in today for follow up of a chest wall abscess. He has completed a round of keflex now. The abscess has opened on its own and has drained a small amount of purulent drainage. There has been no improvement in warmth, erythema, or tenderness. Denies fever or chills. They have been doing warm compresses and using epsom salt.   ROS As per HPI.      Objective:    BP 97/60   Pulse 80   Temp 98.2 F (36.8 C) (Temporal)   Ht '5\' 10"'$  (1.778 m)   Wt 276 lb (125.2 kg)   SpO2 91%   BMI 39.60 kg/m    Physical Exam Vitals and nursing note reviewed.  Constitutional:      General: He is not in acute distress.    Appearance: He is not ill-appearing, toxic-appearing or diaphoretic.  Pulmonary:     Effort: Pulmonary effort is normal. No respiratory distress.  Skin:    General: Skin is warm and dry.     Findings: Abscess (4 cm x 7 cm abscess to center chest wall with small amount or purulent exudate present. Flucutance present. Surrounging erythema, warmth, and tenderness) present.  Neurological:     General: No focal deficit present.     Mental Status: He is alert and oriented to person, place, and time.  Psychiatric:        Mood and Affect: Mood normal.        Behavior: Behavior normal.    I & D  Date/Time: 08/08/2022 11:20 AM  Performed by: Gwenlyn Perking, FNP Authorized by: Gwenlyn Perking, FNP   Consent:    Consent obtained:  Verbal   Consent given by:  Patient   Risks, benefits, and alternatives were discussed: yes     Risks discussed:  Bleeding, incomplete drainage, infection and pain   Alternatives discussed:  Referral Location:    Type:  Abscess   Location:  Trunk   Trunk location:  Chest Pre-procedure details:    Skin preparation:   Povidone-iodine Sedation:    Sedation type:  None Anesthesia:    Anesthesia method:  Local infiltration   Local anesthetic:  Lidocaine 2% WITH epi Procedure type:    Complexity:  Simple Procedure details:    Needle aspiration: no     Incision types:  Stab incision   Incision depth:  Dermal   Scalpel blade:  11   Wound management:  Probed and deloculated   Drainage:  Bloody and purulent   Drainage amount:  Copious   Wound treatment:  Wound left open   Packing materials:  None Post-procedure details:    Procedure completion:  Tolerated well, no immediate complications   No results found for any visits on 08/08/22.      Assessment & Plan:   [name] was seen today for recurrent skin infections.  Diagnoses and all orders for this visit:  Abscess of chest wall I&D today with copious amounts of purulent drainage. Doxycycline as below. Discussed home wound care. Follow up in 2 days, sooner for worsening symptoms.  -     doxycycline (VIBRA-TABS) 100 MG tablet; Take 1 tablet (100 mg total) by mouth 2 (two) times daily for 7 days. -  I & D  Return in about 2 days (around 08/10/2022) for wound recheck.  The patient indicates understanding of these issues and agrees with the plan.  Gwenlyn Perking, FNP

## 2022-08-10 ENCOUNTER — Ambulatory Visit (INDEPENDENT_AMBULATORY_CARE_PROVIDER_SITE_OTHER): Payer: Medicare HMO | Admitting: Family Medicine

## 2022-08-10 ENCOUNTER — Encounter: Payer: Self-pay | Admitting: Family Medicine

## 2022-08-10 VITALS — BP 103/72 | HR 90 | Temp 97.9°F | Ht 70.0 in | Wt 272.0 lb

## 2022-08-10 DIAGNOSIS — L02213 Cutaneous abscess of chest wall: Secondary | ICD-10-CM

## 2022-08-10 NOTE — Progress Notes (Signed)
   Established Patient Office Visit  Subjective   Patient ID: Juan Merritt, male    DOB: 04-Nov-1951  Age: 71 y.o. MRN: [id number]  Chief Complaint  Patient presents with   Abscess    HPI Juan Merritt is here for a follow up of an abscess of his chest wall. He was first treated on 07/29/22 with keflex. He declined I&D on that day. He returned on 08/08/22 for follow up of worsening symptoms. He has worsening erythema and larger induration. He agreed to I&D on 07/29/22. I&D was preformed with copious amounts of purulent drainage. He was started on doxycyline. He reports that the wound has been continuing to drain nonpurulent exudate. He reports decreased erythema and tenderness. Denies fever or chills.   Past Medical History:  Diagnosis Date   Arthritis    BPH associated with nocturia    Chronic gout    COPD (chronic obstructive pulmonary disease) ([place])    not on home o2   History of colon polyps    History of COVID-19    04/ 2019 hospital admission in epic, severe sepsis due to covid w/ acute respiratory failure;   and 05/ 2021 mild to moderate symptoms, recovered at home   Hyperlipidemia, mixed    Hypertension    Phimosis    Type 2 diabetes mellitus ([place])    followed by pcp   (05-17-2022  pt stated checks sugar multiple times daily w/ Libre,  fasting average --- 100-110)      ROS As per HPI.    Objective:     BP 103/72   Pulse 90   Temp 97.9 F (36.6 C) (Temporal)   Ht '5\' 10"'$  (1.778 m)   Wt 272 lb (123.4 kg)   SpO2 94%   BMI 39.03 kg/m    Physical Exam Vitals and nursing note reviewed.  Constitutional:      General: He is not in acute distress.    Appearance: He is not ill-appearing, toxic-appearing or diaphoretic.  Pulmonary:     Effort: Pulmonary effort is normal. No respiratory distress.  Musculoskeletal:     Right lower leg: No edema.     Left lower leg: No edema.  Skin:    General: Skin is warm and dry.     Findings: Abscess (Open abscess to chest well.  Signiciant improvement with decreased erythema, warmth, and tenderness. Bloody draiange present. No fluctuance present. Area of induration is now 4 cm x 4 cm.) present.  Neurological:     General: No focal deficit present.     Mental Status: He is alert and oriented to person, place, and time.  Psychiatric:        Mood and Affect: Mood normal.    No results found for any visits on 08/10/22.    The ASCVD Risk score (Arnett DK, et al., 2019) failed to calculate for the following reasons:   The valid total cholesterol range is 130 to 320 mg/dL    Assessment & Plan:   Juan Merritt was seen today for abscess.  Diagnoses and all orders for this visit:  Abscess of chest wall Significant improvement since I&D 2 days ago. Continue doxycyline, continue to promote drainage. Follow up early next week, sooner for new or worsening symptoms.   Return in about 5 days (around 08/15/2022) for wound follow up.   The patient indicates understanding of these issues and agrees with the plan.  Gwenlyn Perking, FNP

## 2022-08-10 NOTE — Patient Instructions (Signed)
Skin Abscess  A skin abscess is an infected area on or under your skin that contains a collection of pus and other material. An abscess may also be called a furuncle, carbuncle, or boil. An abscess can occur in or on almost any part of your body. Some abscesses break open (rupture) on their own. Most continue to get worse unless they are treated. The infection can spread deeper into the body and eventually into your blood, which can make you feel ill. Treatment usually involves draining the abscess. What are the causes? An abscess occurs when germs, like bacteria, pass through your skin and cause an infection. This may be caused by: A scrape or cut on your skin. A puncture wound through your skin, including a needle injection or insect bite. Blocked oil or sweat glands. Blocked and infected hair follicles. A cyst that forms beneath your skin (sebaceous cyst) and becomes infected. What increases the risk? This condition is more likely to develop in people who: Have a weak body defense system (immune system). Have diabetes. Have dry and irritated skin. Get frequent injections or use illegal IV drugs. Have a foreign body in a wound, such as a splinter. Have problems with their lymph system or veins. What are the signs or symptoms? Symptoms of this condition include: A painful, firm bump under the skin. A bump with pus at the top. This may break through the skin and drain. Other symptoms include: Redness surrounding the abscess site. Warmth. Swelling of the lymph nodes (glands) near the abscess. Tenderness. A sore on the skin. How is this diagnosed? This condition may be diagnosed based on: A physical exam. Your medical history. A sample of pus. This may be used to find out what is causing the infection. Blood tests. Imaging tests, such as an ultrasound, CT scan, or MRI. How is this treated? A small abscess that drains on its own may not need treatment. Treatment for larger abscesses  may include: Moist heat or heat pack applied to the area several times a day. A procedure to drain the abscess (incision and drainage). Antibiotic medicines. For a severe abscess, you may first get antibiotics through an IV and then change to antibiotics by mouth. Follow these instructions at home: Medicines  Take over-the-counter and prescription medicines only as told by your health care provider. If you were prescribed an antibiotic medicine, take it as told by your health care provider. Do not stop taking the antibiotic even if you start to feel better. Abscess care  If you have an abscess that has not drained, apply heat to the affected area. Use the heat source that your health care provider recommends, such as a moist heat pack or a heating pad. Place a towel between your skin and the heat source. Leave the heat on for 20-30 minutes. Remove the heat if your skin turns bright red. This is especially important if you are unable to feel pain, heat, or cold. You may have a greater risk of getting burned. Follow instructions from your health care provider about how to take care of your abscess. Make sure you: Cover the abscess with a bandage (dressing). Change your dressing or gauze as told by your health care provider. Wash your hands with soap and water before you change the dressing or gauze. If soap and water are not available, use hand sanitizer. Check your abscess every day for signs of a worsening infection. Check for: More redness, swelling, or pain. More fluid or blood. Warmth.   More pus or a bad smell. General instructions To avoid spreading the infection: Do not share personal care items, towels, or hot tubs with others. Avoid making skin contact with other people. Keep all follow-up visits as told by your health care provider. This is important. Contact a health care provider if you have: More redness, swelling, or pain around your abscess. More fluid or blood coming from  your abscess. Warm skin around your abscess. More pus or a bad smell coming from your abscess. Muscle aches. Chills or a general ill feeling. Get help right away if you: Have severe pain. See red streaks on your skin spreading away from the abscess. See redness that spreads quickly. Have a fever or chills. Summary A skin abscess is an infected area on or under your skin that contains a collection of pus and other material. A small abscess that drains on its own may not need treatment. Treatment for larger abscesses may include having a procedure to drain the abscess and taking an antibiotic. This information is not intended to replace advice given to you by your health care provider. Make sure you discuss any questions you have with your health care provider. Document Revised: 03/10/2022 Document Reviewed: 09/13/2021 Elsevier Patient Education  2023 Elsevier Inc.  

## 2022-08-15 ENCOUNTER — Encounter: Payer: Self-pay | Admitting: Family Medicine

## 2022-08-15 ENCOUNTER — Ambulatory Visit (INDEPENDENT_AMBULATORY_CARE_PROVIDER_SITE_OTHER): Payer: Medicare HMO | Admitting: Family Medicine

## 2022-08-15 VITALS — BP 104/62 | HR 85 | Temp 97.8°F | Ht 70.0 in | Wt 273.5 lb

## 2022-08-15 DIAGNOSIS — L02213 Cutaneous abscess of chest wall: Secondary | ICD-10-CM

## 2022-08-15 NOTE — Progress Notes (Signed)
   Acute Office Visit  Subjective:     Patient ID: Juan Merritt, male    DOB: [birth year], 71 y.o.   MRN: [id number]  Chief Complaint  Patient presents with   Abscess    Abscess   Juan Merritt is here for a follow up of an abscess of Juan Merritt chest wall. Juan Merritt was first treated on 07/29/22 with keflex. Juan Merritt declined I&D on that day. Juan Merritt returned on 08/08/22 for follow up of worsening symptoms. Juan Merritt has worsening erythema and larger induration. Juan Merritt agreed to I&D on 07/29/22. I&D was preformed with copious amounts of purulent drainage. Juan Merritt was started on doxycyline for 7 days. On follow up Juan Merritt symptoms had significantly improved. Juan Merritt continues to have small amounts of non purulent drainage. Denies fever or chills. No tenderness or pain. Erythema continues to improve.   ROS As per HPI.      Objective:    BP 104/62   Pulse 85   Temp 97.8 F (36.6 C) (Temporal)   Ht '5\' 10"'$  (1.778 m)   Wt 273 lb 8 oz (124.1 kg)   SpO2 93%   BMI 39.24 kg/m    Physical Exam Vitals and nursing note reviewed.  Constitutional:      General: Juan Merritt is not in acute distress.    Appearance: Juan Merritt is not ill-appearing, toxic-appearing or diaphoretic.  Pulmonary:     Effort: Pulmonary effort is normal. No respiratory distress.  Musculoskeletal:     Right lower leg: No edema.     Left lower leg: No edema.  Skin:    General: Skin is warm and dry.     Findings: Abscess (No warmth or tenderness. Small amount of clear/bloody draiange present. No fluctuance present. Area of induration is now 2 cm x 1.5 cm.) present.  Neurological:     General: No focal deficit present.     Mental Status: Juan Merritt is alert and oriented to person, place, and time.  Psychiatric:        Mood and Affect: Mood normal.     No results found for any visits on 08/15/22.      Assessment & Plan:   Rimas was seen today for abscess.  Diagnoses and all orders for this visit:  Abscess of chest wall Continues to improve. Warm compresses, promote drainage.  Has completed keflex and doxycyline. Return to office for new or worsening symptoms, or if symptoms persist.   The patient indicates understanding of these issues and agrees with the plan.  Gwenlyn Perking, FNP

## 2022-08-15 NOTE — Patient Instructions (Signed)
Skin Abscess  A skin abscess is an infected area on or under your skin that contains a collection of pus and other material. An abscess may also be called a furuncle, carbuncle, or boil. An abscess can occur in or on almost any part of your body. Some abscesses break open (rupture) on their own. Most continue to get worse unless they are treated. The infection can spread deeper into the body and eventually into your blood, which can make you feel ill. Treatment usually involves draining the abscess. What are the causes? An abscess occurs when germs, like bacteria, pass through your skin and cause an infection. This may be caused by: A scrape or cut on your skin. A puncture wound through your skin, including a needle injection or insect bite. Blocked oil or sweat glands. Blocked and infected hair follicles. A cyst that forms beneath your skin (sebaceous cyst) and becomes infected. What increases the risk? This condition is more likely to develop in people who: Have a weak body defense system (immune system). Have diabetes. Have dry and irritated skin. Get frequent injections or use illegal IV drugs. Have a foreign body in a wound, such as a splinter. Have problems with their lymph system or veins. What are the signs or symptoms? Symptoms of this condition include: A painful, firm bump under the skin. A bump with pus at the top. This may break through the skin and drain. Other symptoms include: Redness surrounding the abscess site. Warmth. Swelling of the lymph nodes (glands) near the abscess. Tenderness. A sore on the skin. How is this diagnosed? This condition may be diagnosed based on: A physical exam. Your medical history. A sample of pus. This may be used to find out what is causing the infection. Blood tests. Imaging tests, such as an ultrasound, CT scan, or MRI. How is this treated? A small abscess that drains on its own may not need treatment. Treatment for larger abscesses  may include: Moist heat or heat pack applied to the area several times a day. A procedure to drain the abscess (incision and drainage). Antibiotic medicines. For a severe abscess, you may first get antibiotics through an IV and then change to antibiotics by mouth. Follow these instructions at home: Medicines  Take over-the-counter and prescription medicines only as told by your health care provider. If you were prescribed an antibiotic medicine, take it as told by your health care provider. Do not stop taking the antibiotic even if you start to feel better. Abscess care  If you have an abscess that has not drained, apply heat to the affected area. Use the heat source that your health care provider recommends, such as a moist heat pack or a heating pad. Place a towel between your skin and the heat source. Leave the heat on for 20-30 minutes. Remove the heat if your skin turns bright red. This is especially important if you are unable to feel pain, heat, or cold. You may have a greater risk of getting burned. Follow instructions from your health care provider about how to take care of your abscess. Make sure you: Cover the abscess with a bandage (dressing). Change your dressing or gauze as told by your health care provider. Wash your hands with soap and water before you change the dressing or gauze. If soap and water are not available, use hand sanitizer. Check your abscess every day for signs of a worsening infection. Check for: More redness, swelling, or pain. More fluid or blood. Warmth.   More pus or a bad smell. General instructions To avoid spreading the infection: Do not share personal care items, towels, or hot tubs with others. Avoid making skin contact with other people. Keep all follow-up visits as told by your health care provider. This is important. Contact a health care provider if you have: More redness, swelling, or pain around your abscess. More fluid or blood coming from  your abscess. Warm skin around your abscess. More pus or a bad smell coming from your abscess. Muscle aches. Chills or a general ill feeling. Get help right away if you: Have severe pain. See red streaks on your skin spreading away from the abscess. See redness that spreads quickly. Have a fever or chills. Summary A skin abscess is an infected area on or under your skin that contains a collection of pus and other material. A small abscess that drains on its own may not need treatment. Treatment for larger abscesses may include having a procedure to drain the abscess and taking an antibiotic. This information is not intended to replace advice given to you by your health care provider. Make sure you discuss any questions you have with your health care provider. Document Revised: 03/10/2022 Document Reviewed: 09/13/2021 Elsevier Patient Education  2023 Elsevier Inc.  

## 2022-08-18 ENCOUNTER — Telehealth: Payer: Self-pay | Admitting: Family Medicine

## 2022-08-18 NOTE — Telephone Encounter (Signed)
Pt. Needs to be seen for this. Thanks, WS 

## 2022-08-19 ENCOUNTER — Ambulatory Visit (INDEPENDENT_AMBULATORY_CARE_PROVIDER_SITE_OTHER): Payer: Medicare HMO | Admitting: Family Medicine

## 2022-08-19 ENCOUNTER — Encounter: Payer: Self-pay | Admitting: Family Medicine

## 2022-08-19 ENCOUNTER — Other Ambulatory Visit: Payer: Self-pay | Admitting: Family Medicine

## 2022-08-19 ENCOUNTER — Ambulatory Visit (INDEPENDENT_AMBULATORY_CARE_PROVIDER_SITE_OTHER): Payer: Medicare HMO

## 2022-08-19 VITALS — Wt 273.0 lb

## 2022-08-19 VITALS — BP 103/67 | HR 94 | Temp 98.0°F | Ht 70.0 in | Wt 272.2 lb

## 2022-08-19 DIAGNOSIS — Z0000 Encounter for general adult medical examination without abnormal findings: Secondary | ICD-10-CM

## 2022-08-19 DIAGNOSIS — L02213 Cutaneous abscess of chest wall: Secondary | ICD-10-CM | POA: Diagnosis not present

## 2022-08-19 DIAGNOSIS — E790 Hyperuricemia without signs of inflammatory arthritis and tophaceous disease: Secondary | ICD-10-CM

## 2022-08-19 NOTE — Telephone Encounter (Signed)
Appointment scheduled for today with Tiffany.

## 2022-08-19 NOTE — Progress Notes (Signed)
Subjective:   Juan Merritt is a 71 y.o. male who presents for Medicare Annual/Subsequent preventive examination.  Virtual Visit via Telephone Note  I connected with  Juan Merritt on 08/19/22 at  9:00 AM EDT by telephone and verified that I am speaking with the correct person using two identifiers.  Location: Patient: Home Provider: [facility] Persons participating in the virtual visit: patient/Nurse Health Advisor   I discussed the limitations, risks, security and privacy concerns of performing an evaluation and management service by telephone and the availability of in person appointments. The patient expressed understanding and agreed to proceed.  Interactive audio and video telecommunications were attempted between this nurse and patient, however failed, due to patient having technical difficulties OR patient did not have access to video capability.  We continued and completed visit with audio only.  Some vital signs may be absent or patient reported.   Ranson Belluomini E Deloros Beretta, LPN   Review of Systems     Cardiac Risk Factors include: advanced age (>82mn, >>67women);diabetes mellitus;dyslipidemia;hypertension;male gender;smoking/ tobacco exposure;obesity (BMI >30kg/m2);Other (see comment), Risk factor comments: COPD     Objective:    Today's Vitals   08/19/22 0906  Weight: 273 lb (123.8 kg)   Body mass index is 39.17 kg/m.     08/19/2022    9:13 AM 05/19/2022   10:54 AM 08/18/2021    9:03 AM 05/13/2021    1:46 PM 08/17/2020    8:46 AM 11/27/2019   10:16 AM 08/14/2019    8:40 AM  Advanced Directives  Does Patient Have a Medical Advance Directive? Yes Yes Yes No Yes Yes No  Type of [place]  [place]  Does patient want to make changes to medical advance directive?     No - Patient declined No - Patient declined    Copy of [place] Chart? No - copy requested No - copy requested No - copy requested   No - copy requested   Would patient like information on creating a medical advance directive?    No - Patient declined   No - Patient declined    Current Medications (verified) Outpatient Encounter Medications as of 08/19/2022  Medication Sig   albuterol (VENTOLIN HFA) 108 (90 Base) MCG/ACT inhaler Inhale 2 puffs into the lungs 2 (two) times daily.   allopurinol (ZYLOPRIM) 100 MG tablet TAKE 1 TABLET (100 MG TOTAL) BY MOUTH DAILY. TO PREVENT GOUT   amLODipine (NORVASC) 5 MG tablet Take 1 tablet (5 mg total) by mouth daily. (Patient taking differently: Take 5 mg by mouth daily.)   aspirin EC 81 MG tablet Take 81 mg by mouth daily. Swallow whole.   Blood Glucose Monitoring Suppl (ONETOUCH VERIO FLEX SYSTEM) w/Device KIT TEST BLOOD SUGAR 3 TIMES DAILY AS DIRECTED. DX: E11.65   budesonide-formoterol (SYMBICORT) 160-4.5 MCG/ACT inhaler INHALE 2 PUFFS INTO THE LUNGS IN THE MORNING AND AT BEDTIME. TAKE 2 PUFFS BY MOUTH TWICE A DAY (Patient taking differently: 2 puffs 2 (two) times daily. INHALE 2 PUFFS INTO THE LUNGS IN THE MORNING AND AT BEDTIME. TAKE 2 PUFFS BY MOUTH TWICE A DAY)   clotrimazole-betamethasone (LOTRISONE) cream Apply 1 application topically 2 (two) times daily.   Continuous Blood Gluc Sensor (FREESTYLE LIBRE 2 SENSOR) MISC USE TO TEST BLOOD SUGAR CONTINUOUSLY. Dx. E11.65 SEND TO [facility] FOR MEDICARE COVERAGE, DO NOT SEND TO  LOCAL PHARMACY   Insulin Pen Needle 31G X 8 MM MISC Use to inject insulin daily. DX: E11.59   ipratropium-albuterol (DUONEB) 0.5-2.5 (3) MG/3ML SOLN Take 3 mLs by nebulization every 4 (four) hours as needed. (Patient taking differently: Take 3 mLs by nebulization every 4 (four) hours as needed (SOB).)   losartan (COZAAR) 100 MG tablet Take 1 tablet (100 mg total) by mouth daily. (Patient taking differently: Take 100 mg by mouth daily.)   meloxicam  (MOBIC) 15 MG tablet TAKE 1 TABLET (15 MG TOTAL) BY MOUTH DAILY.   metFORMIN (GLUCOPHAGE-XR) 750 MG 24 hr tablet Take 1 tablet (750 mg total) by mouth daily with breakfast.   Microlet Lancets MISC Use to test blood sugar 3 times daily as directed.; DX:e.11.65   potassium chloride SA (KLOR-CON M20) 20 MEQ tablet TAKE 1 TABLET (20 MEQ TOTAL) DAILY BY MOUTH. (Patient taking differently: 20 mEq daily. TAKE 1 TABLET (20 MEQ TOTAL) DAILY BY MOUTH.)   rosuvastatin (CRESTOR) 5 MG tablet Take 1 tablet (5 mg total) by mouth daily. For cholesterol (Patient taking differently: Take 5 mg by mouth daily. For cholesterol)   tamsulosin (FLOMAX) 0.4 MG CAPS capsule Take 2 capsules (0.8 mg total) by mouth at bedtime. For urine flow and prostate   torsemide (DEMADEX) 100 MG tablet Take 1 tablet (100 mg total) by mouth 2 (two) times daily. At arising and at lunch (Patient taking differently: Take 100 mg by mouth daily. At arising and at lunch)   [DISCONTINUED] allopurinol (ZYLOPRIM) 100 MG tablet TAKE 1 TABLET (100 MG TOTAL) BY MOUTH DAILY. TO PREVENT GOUT (Patient taking differently: Take 100 mg by mouth daily. TAKE 1 TABLET (100 MG TOTAL) BY MOUTH DAILY. TO PREVENT GOUT)   [DISCONTINUED] traMADol (ULTRAM) 50 MG tablet Take 1 tablet (50 mg total) by mouth every 6 (six) hours as needed. (Patient not taking: Reported on 08/19/2022)   No facility-administered encounter medications on file as of 08/19/2022.    Allergies (verified) Patient has no known allergies.   History: Past Medical History:  Diagnosis Date   Arthritis    BPH associated with nocturia    Chronic gout    COPD (chronic obstructive pulmonary disease) ([place])    not on home o2   History of colon polyps    History of COVID-19    04/ 2019 hospital admission in epic, severe sepsis due to covid w/ acute respiratory failure;   and 05/ 2021 mild to moderate symptoms, recovered at home   Hyperlipidemia, mixed    Hypertension    Phimosis    Type 2 diabetes  mellitus ([place])    followed by pcp   (05-17-2022  pt stated checks sugar multiple times daily w/ Libre,  fasting average --- 100-110)   Past Surgical History:  Procedure Laterality Date   CIRCUMCISION N/A 05/19/2022   Procedure: CIRCUMCISION ADULT;  Surgeon: Franchot Gallo, MD;  Location: [facility];  Service: Urology;  Laterality: N/A;  26 MINUTES   COLONOSCOPY  12/01/2021   by dr Loletha Carrow   LAPAROSCOPIC CHOLECYSTECTOMY  2008   in [place]     child   Family History  Problem Relation Age of Onset   Asthma Mother    Diabetes Father    Heart attack Father 105   Leukemia Brother    Esophageal cancer Paternal Uncle    Lung disease Other        pat cousin   Heart attack Other  pat cousin   Colon cancer Neg Hx    Stomach cancer Neg Hx    Social History   Socioeconomic History   Marital status: Married    Spouse name: [name]   Number of children: 5   Years of education: 10   Highest education level: 10th grade  Occupational History   Occupation: retired Administrator  Tobacco Use   Smoking status: Former    Packs/day: 0.25    Years: 40.00    Total pack years: 10.00    Types: Cigarettes, E-cigarettes    Quit date: 01/15/2017    Years since quitting: 5.5   Smokeless tobacco: Current    Types: Chew   Tobacco comments:    Smoked 1-2 packs per day for about 45 years - now e-cigarette sometimes  Vaping Use   Vaping Use: Every day   Substances: Nicotine   Devices: unsure  Substance and Sexual Activity   Alcohol use: Yes    Comment: occasional   Drug use: Never   Sexual activity: Not Currently  Other Topics Concern   Not on file  Social History Narrative   Lives at home with wife.  Grown children.  Retired Pharmacist, community. Children live nearby.    Social Determinants of Health   Financial Resource Strain: Low Risk  (08/19/2022)   Overall Financial Resource Strain (CARDIA)    Difficulty of Paying Living Expenses: Not hard at all  Food  Insecurity: No Food Insecurity (08/19/2022)   Hunger Vital Sign    Worried About Running Out of Food in the Last Year: Never true    Ran Out of Food in the Last Year: Never true  Transportation Merritt: No Transportation Merritt (08/19/2022)   PRAPARE - Hydrologist (Medical): No    Lack of Transportation (Non-Medical): No  Physical Activity: Sufficiently Active (08/19/2022)   Exercise Vital Sign    Days of Exercise per Week: 7 days    Minutes of Exercise per Session: 30 min  Stress: No Stress Concern Present (08/19/2022)   [place]    Feeling of Stress : Not at all  Social Connections: Moderately Isolated (08/19/2022)   Social Connection and Isolation Panel [NHANES]    Frequency of Communication with Friends and Family: More than three times a week    Frequency of Social Gatherings with Friends and Family: More than three times a week    Attends Religious Services: Never    Marine scientist or Organizations: No    Attends Music therapist: Never    Marital Status: Married    Tobacco Counseling Ready to quit: Yes Counseling given: Yes Tobacco comments: Smoked 1-2 packs per day for about 45 years - now e-cigarette sometimes   Clinical Intake:  Pre-visit preparation completed: Yes  Pain : No/denies pain     BMI - recorded: 39.17 Nutritional Status: BMI > 30  Obese Nutritional Risks: None Diabetes: Yes CBG done?: No Did pt. bring in CBG monitor from home?: No  How often do you need to have someone help you when you read instructions, pamphlets, or other written materials from your doctor or pharmacy?: 1 - Never  Diabetic? Nutrition Risk Assessment:  Has the patient had any N/V/D within the last 2 months?  No  Does the patient have any non-healing wounds?  No  Has the patient had any unintentional weight loss or weight gain?  No   Diabetes:  Is  the patient diabetic?   Yes  If diabetic, was a CBG obtained today?  No  Did the patient bring in their glucometer from home?  No  How often do you monitor your CBG's? BID - 105 this am, 88 last night.   Financial Strains and Diabetes Management:  Are you having any financial strains with the device, your supplies or your medication? No .  Does the patient want to be seen by Chronic Care Management for management of their diabetes?  No  Would the patient like to be referred to a Nutritionist or for Diabetic Management?  No   Diabetic Exams:  Diabetic Eye Exam: Completed 10/08/2021.   Diabetic Foot Exam: Completed 05/25/2022. Pt has been advised about the importance in completing this exam.   Interpreter Needed?: No  Information entered by :: Paitynn Mikus, LPN   Activities of Daily Living    08/19/2022    9:14 AM 05/19/2022   10:57 AM  In your present state of health, do you have any difficulty performing the following activities:  Hearing? 1 0  Comment mild   Vision? 0 0  Difficulty concentrating or making decisions? 0 0  Walking or climbing stairs? 0 0  Dressing or bathing? 0 0  Doing errands, shopping? 0   Preparing Food and eating ? N   Using the Toilet? N   In the past six months, have you accidently leaked urine? N   Do you have problems with loss of bowel control? N   Managing your Medications? N   Managing your Finances? N   Housekeeping or managing your Housekeeping? N     Patient Care Team: [name], MD as PCP - General (Family Medicine) Lavera Guise, [facility] as Pharmacist (Family Medicine) Celestia Khat, [place] (Optometry)  Indicate any recent Medical Services you may have received from other than [facility] providers in the past year (date may be approximate).     Assessment:   This is a routine wellness examination for Juan Merritt.  Hearing/Vision screen Hearing Screening - Comments:: C/o mild hearing difficulties  - no hearing aids Vision Screening - Comments:: Wears reading glasses  prn - up to date with routine eye exams with [facility]  Dietary issues and exercise activities discussed: Current Exercise Habits: Home exercise routine, Type of exercise: walking, Time (Minutes): 30, Intensity: Mild, Exercise limited by: respiratory conditions(s)   Goals Addressed   None    Depression Screen    08/19/2022    9:12 AM 08/10/2022   11:56 AM 08/08/2022   11:21 AM 07/29/2022   11:10 AM 05/25/2022    8:09 AM 02/22/2022    8:18 AM 11/24/2021    3:40 PM  PHQ 2/9 Scores  PHQ - 2 Score 0 0 0 0 0 0 0  PHQ- 9 Score 0 0 0 0       Fall Risk    08/19/2022    9:08 AM 08/10/2022   11:55 AM 08/08/2022   11:21 AM 07/29/2022   11:10 AM 05/25/2022    8:09 AM  Fall Risk   Falls in the past year? 0 0 0 0 0  Number falls in past yr: 0      Injury with Fall? 0      Risk for fall due to : No Fall Risks      Follow up Falls prevention discussed        [place]:  Any stairs in or around the home?  Yes  If so, are there any without handrails? No  Home free of loose throw rugs in walkways, pet beds, electrical cords, etc? Yes  Adequate lighting in your home to reduce risk of falls? Yes   ASSISTIVE DEVICES UTILIZED TO PREVENT FALLS:  Life alert? No  Use of a cane, walker or w/c? No  Grab bars in the bathroom? Yes  Shower chair or bench in shower? Yes  Elevated toilet seat or a handicapped toilet? No   TIMED UP AND GO:  Was the test performed? No . Telephonic visit  Cognitive Function:        08/19/2022    9:14 AM 08/17/2020    8:41 AM 08/14/2019    8:45 AM  6CIT Screen  What Year? 0 points 0 points 0 points  What month? 0 points 0 points 0 points  What time? 0 points 0 points 0 points  Count back from 20 0 points 0 points 0 points  Months in reverse 0 points 0 points 2 points  Repeat phrase 2 points 0 points 2 points  Total Score 2 points 0 points 4 points    Immunizations Immunization History  Administered Date(s) Administered    Fluad Quad(high Dose 65+) 09/23/2019, 11/20/2020, 09/28/2021   Influenza,inj,Quad PF,6+ Mos 10/14/2016   Moderna SARS-COV2 Booster Vaccination 10/14/2020, 12/08/2021   Moderna Sars-Covid-2 Vaccination 02/26/2020, 03/25/2020   PFIZER(Purple Top)SARS-COV-2 Vaccination 06/04/2021   PNEUMOCOCCAL CONJUGATE-20 09/28/2021   Pneumococcal Polysaccharide-23 09/07/2015    TDAP status: Due, Education has been provided regarding the importance of this vaccine. Advised may receive this vaccine at local pharmacy or Health Dept. Aware to provide a copy of the vaccination record if obtained from local pharmacy or Health Dept. Verbalized acceptance and understanding.  Flu Vaccine status: Up to date  Pneumococcal vaccine status: Up to date  Covid-19 vaccine status: Completed vaccines  Qualifies for Shingles Vaccine? Yes   Zostavax completed No   Shingrix Completed?: No.    Education has been provided regarding the importance of this vaccine. Patient has been advised to call insurance company to determine out of pocket expense if they have not yet received this vaccine. Advised may also receive vaccine at local pharmacy or Health Dept. Verbalized acceptance and understanding.  Screening Tests Health Maintenance  Topic Date Due   Zoster Vaccines- Shingrix (1 of 2) Never done   COVID-19 Vaccine (4 - Moderna series) 02/02/2022   TETANUS/TDAP  02/23/2023 (Originally 07/20/1970)   INFLUENZA VACCINE  03/19/2023 (Originally 07/19/2022)   OPHTHALMOLOGY EXAM  10/08/2022   HEMOGLOBIN A1C  11/24/2022   FOOT EXAM  05/26/2023   COLONOSCOPY (Pts 45-54yr Insurance coverage will need to be confirmed)  12/01/2024   Pneumonia Vaccine 71 Years old  Completed   Hepatitis C Screening  Completed   HPV VACCINES  Aged Out   Fecal DNA (Cologuard)  Discontinued    Health Maintenance  Health Maintenance Due  Topic Date Due   Zoster Vaccines- Shingrix (1 of 2) Never done   COVID-19 Vaccine (4 - Moderna series) 02/02/2022     Colorectal cancer screening: Type of screening: Colonoscopy. Completed 12/01/2021. Repeat every 3 years  Lung Cancer Screening: (Low Dose CT Chest recommended if Age 69-80years, 30 pack-year currently smoking OR have quit w/in 15years.) does qualify.   Lung Cancer Screening Referral: advised to discuss with Dr SLivia Snellen Additional Screening:  Hepatitis C Screening: does qualify; Completed 05/13/2021  Vision Screening: Recommended annual ophthalmology exams for early detection of glaucoma and other  disorders of the eye. Is the patient up to date with their annual eye exam?  Yes  Who is the provider or what is the name of the office in which the patient attends annual eye exams? [place] If pt is not established with a provider, would they like to be referred to a provider to establish care? No .   Dental Screening: Recommended annual dental exams for proper oral hygiene  Community Resource Referral / Chronic Care Management: CRR required this visit?  No   CCM required this visit?  No      Plan:     I have personally reviewed and noted the following in the patient's chart:   Medical and social history Use of alcohol, tobacco or illicit drugs  Current medications and supplements including opioid prescriptions. Patient is not currently taking opioid prescriptions. Functional ability and status Nutritional status Physical activity Advanced directives List of other physicians Hospitalizations, surgeries, and ER visits in previous 12 months Vitals Screenings to include cognitive, depression, and falls Referrals and appointments  In addition, I have reviewed and discussed with patient certain preventive protocols, quality metrics, and best practice recommendations. A written personalized care plan for preventive services as well as general preventive health recommendations were provided to patient.     Sandrea Hammond, LPN   4/0/8144   Nurse Notes: He is interested in  low dose chest ct for Lung Cancer Screening - since he has never had this done, he Merritt consult with PCP first - he would like to discuss at next appt.

## 2022-08-19 NOTE — Progress Notes (Signed)
   Acute Office Visit  Subjective:     Patient ID: Juan Merritt, male    DOB: 03-Jun-1951, 71 y.o.   MRN: [id number]  Chief Complaint  Patient presents with   Recurrent Skin Infections    Abscess   Juan Merritt is here for a follow up of an abscess of his chest wall. He was first treated on 07/29/22 with keflex. He declined I&D on that day. He returned on 08/08/22 for follow up of worsening symptoms. He has worsening erythema and larger induration. He agreed to I&D on 07/29/22. I&D was preformed with copious amounts of purulent drainage. He was started on doxycyline for 7 days. At this follow up on 08/10/22 and 08/15/22 his symptoms had significantly improved. He continues to have small amounts of non purulent drainage. Denies fever or chills. No tenderness or pain. Denies increased size, erythema, or swelling.   ROS As per HPI.      Objective:    BP 103/67   Pulse 94   Temp 98 F (36.7 C) (Temporal)   Ht '5\' 10"'$  (1.778 m)   Wt 272 lb 4 oz (123.5 kg)   SpO2 95%   BMI 39.06 kg/m    Physical Exam Vitals and nursing note reviewed.  Constitutional:      General: He is not in acute distress.    Appearance: He is not ill-appearing, toxic-appearing or diaphoretic.  Pulmonary:     Effort: Pulmonary effort is normal. No respiratory distress.  Musculoskeletal:     Right lower leg: No edema.     Left lower leg: No edema.  Skin:    General: Skin is warm and dry.     Findings: Abscess (No warmth or tenderness. Small amount of clear draiange present. No fluctuance present. Area of induration is now 2 cm x 1 cm. Continues to show improvement.) present.  Neurological:     General: No focal deficit present.     Mental Status: He is alert and oriented to person, place, and time.  Psychiatric:        Mood and Affect: Mood normal.     No results found for any visits on 08/19/22.      Assessment & Plan:   Juan Merritt was seen today for abscess.  Diagnoses and all orders for this  visit:  Abscess of chest wall Continues to improve. Warm compresses, promote drainage. He has I&D and has completed keflex and doxycyline. Return to office for new or worsening symptoms, or if symptoms persist.   The patient indicates understanding of these issues and agrees with the plan.  Gwenlyn Perking, FNP

## 2022-08-19 NOTE — Patient Instructions (Signed)
Juan Merritt , Thank you for taking time to come for your Medicare Wellness Visit. I appreciate your ongoing commitment to your health goals. Please review the following plan we discussed and let me know if I can assist you in the future.   Screening recommendations/referrals: Colonoscopy: Done 12/01/2021 - Repeat in 3 years   Recommended yearly ophthalmology/optometry visit for glaucoma screening and checkup Recommended yearly dental visit for hygiene and checkup  Vaccinations: Influenza vaccine: Done 09/28/2021 - Repeat annually  Pneumococcal vaccine: Done 09/07/2015 & 09/28/2021   Tdap vaccine: Due - recommended every 10 years Shingles vaccine: Due - Shingrix is 2 doses 2-6 months apart and over 90% effective     Covid-19: Done  02/26/2020, 03/25/2020, 10/14/2020, & 12/08/2021  Advanced directives: Please bring a copy of your health care power of attorney and living will to the office to be added to your chart at your convenience.   Conditions/risks identified: Aim for 30 minutes of exercise or brisk walking, 6-8 glasses of water, and 5 servings of fruits and vegetables each day.   If you wish to quit smoking, help is available. For free tobacco cessation program offerings call the [facility] at 812-664-8564 or Live Well Line at [phone]. You may also visit www.[facility].com or email [email or web address] for more information on other programs.   You may also call 1-800-QUIT-NOW 413-458-2586) or visit www.VirusCrisis.dk or www.BecomeAnEx.org for additional resources on smoking cessation.    Next appointment: Follow up in one year for your annual wellness visit.   Preventive Care 20 Years and Older, Male  Preventive care refers to lifestyle choices and visits with your health care provider that can promote health and wellness. What does preventive care include? A yearly physical exam. This is also called an annual well check. Dental exams once or twice a  year. Routine eye exams. Ask your health care provider how often you should have your eyes checked. Personal lifestyle choices, including: Daily care of your teeth and gums. Regular physical activity. Eating a healthy diet. Avoiding tobacco and drug use. Limiting alcohol use. Practicing safe sex. Taking low doses of aspirin every day. Taking vitamin and mineral supplements as recommended by your health care provider. What happens during an annual well check? The services and screenings done by your health care provider during your annual well check will depend on your age, overall health, lifestyle risk factors, and family history of disease. Counseling  Your health care provider may ask you questions about your: Alcohol use. Tobacco use. Drug use. Emotional well-being. Home and relationship well-being. Sexual activity. Eating habits. History of falls. Memory and ability to understand (cognition). Work and work Statistician. Screening  You may have the following tests or measurements: Height, weight, and BMI. Blood pressure. Lipid and cholesterol levels. These may be checked every 5 years, or more frequently if you are over 66 years old. Skin check. Lung cancer screening. You may have this screening every year starting at age 26 if you have a 30-pack-year history of smoking and currently smoke or have quit within the past 15 years. Fecal occult blood test (FOBT) of the stool. You may have this test every year starting at age 27. Flexible sigmoidoscopy or colonoscopy. You may have a sigmoidoscopy every 5 years or a colonoscopy every 10 years starting at age 61. Prostate cancer screening. Recommendations will vary depending on your family history and other risks. Hepatitis C blood test. Hepatitis B blood test. Sexually transmitted disease (STD) testing. Diabetes  screening. This is done by checking your blood sugar (glucose) after you have not eaten for a while (fasting). You may  have this done every 1-3 years. Abdominal aortic aneurysm (AAA) screening. You may need this if you are a current or former smoker. Osteoporosis. You may be screened starting at age 13 if you are at high risk. Talk with your health care provider about your test results, treatment options, and if necessary, the need for more tests. Vaccines  Your health care provider may recommend certain vaccines, such as: Influenza vaccine. This is recommended every year. Tetanus, diphtheria, and acellular pertussis (Tdap, Td) vaccine. You may need a Td booster every 10 years. Zoster vaccine. You may need this after age 12. Pneumococcal 13-valent conjugate (PCV13) vaccine. One dose is recommended after age 83. Pneumococcal polysaccharide (PPSV23) vaccine. One dose is recommended after age 70. Talk to your health care provider about which screenings and vaccines you need and how often you need them. This information is not intended to replace advice given to you by your health care provider. Make sure you discuss any questions you have with your health care provider. Document Released: 01/01/2016 Document Revised: 08/24/2016 Document Reviewed: 10/06/2015 Elsevier Interactive Patient Education  2017 [employer] Prevention in the Home Falls can cause injuries. They can happen to people of all ages. There are many things you can do to make your home safe and to help prevent falls. What can I do on the outside of my home? Regularly fix the edges of walkways and driveways and fix any cracks. Remove anything that might make you trip as you walk through a door, such as a raised step or threshold. Trim any bushes or trees on the path to your home. Use bright outdoor lighting. Clear any walking paths of anything that might make someone trip, such as rocks or tools. Regularly check to see if handrails are loose or broken. Make sure that both sides of any steps have handrails. Any raised decks and porches  should have guardrails on the edges. Have any leaves, snow, or ice cleared regularly. Use sand or salt on walking paths during winter. Clean up any spills in your garage right away. This includes oil or grease spills. What can I do in the bathroom? Use night lights. Install grab bars by the toilet and in the tub and shower. Do not use towel bars as grab bars. Use non-skid mats or decals in the tub or shower. If you need to sit down in the shower, use a plastic, non-slip stool. Keep the floor dry. Clean up any water that spills on the floor as soon as it happens. Remove soap buildup in the tub or shower regularly. Attach bath mats securely with double-sided non-slip rug tape. Do not have throw rugs and other things on the floor that can make you trip. What can I do in the bedroom? Use night lights. Make sure that you have a light by your bed that is easy to reach. Do not use any sheets or blankets that are too big for your bed. They should not hang down onto the floor. Have a firm chair that has side arms. You can use this for support while you get dressed. Do not have throw rugs and other things on the floor that can make you trip. What can I do in the kitchen? Clean up any spills right away. Avoid walking on wet floors. Keep items that you use a lot in easy-to-reach places.  If you need to reach something above you, use a strong step stool that has a grab bar. Keep electrical cords out of the way. Do not use floor polish or wax that makes floors slippery. If you must use wax, use non-skid floor wax. Do not have throw rugs and other things on the floor that can make you trip. What can I do with my stairs? Do not leave any items on the stairs. Make sure that there are handrails on both sides of the stairs and use them. Fix handrails that are broken or loose. Make sure that handrails are as long as the stairways. Check any carpeting to make sure that it is firmly attached to the stairs.  Fix any carpet that is loose or worn. Avoid having throw rugs at the top or bottom of the stairs. If you do have throw rugs, attach them to the floor with carpet tape. Make sure that you have a light switch at the top of the stairs and the bottom of the stairs. If you do not have them, ask someone to add them for you. What else can I do to help prevent falls? Wear shoes that: Do not have high heels. Have rubber bottoms. Are comfortable and fit you well. Are closed at the toe. Do not wear sandals. If you use a stepladder: Make sure that it is fully opened. Do not climb a closed stepladder. Make sure that both sides of the stepladder are locked into place. Ask someone to hold it for you, if possible. Clearly mark and make sure that you can see: Any grab bars or handrails. First and last steps. Where the edge of each step is. Use tools that help you move around (mobility aids) if they are needed. These include: Canes. Walkers. Scooters. Crutches. Turn on the lights when you go into a dark area. Replace any light bulbs as soon as they burn out. Set up your furniture so you have a clear path. Avoid moving your furniture around. If any of your floors are uneven, fix them. If there are any pets around you, be aware of where they are. Review your medicines with your doctor. Some medicines can make you feel dizzy. This can increase your chance of falling. Ask your doctor what other things that you can do to help prevent falls. This information is not intended to replace advice given to you by your health care provider. Make sure you discuss any questions you have with your health care provider. Document Released: 10/01/2009 Document Revised: 05/12/2016 Document Reviewed: 01/09/2015 Elsevier Interactive Patient Education  2017 [employer].

## 2022-08-30 ENCOUNTER — Encounter: Payer: Self-pay | Admitting: Family Medicine

## 2022-08-30 ENCOUNTER — Ambulatory Visit (INDEPENDENT_AMBULATORY_CARE_PROVIDER_SITE_OTHER): Payer: Medicare HMO | Admitting: Family Medicine

## 2022-08-30 VITALS — BP 127/76 | HR 73 | Temp 97.8°F | Ht 70.0 in | Wt 280.8 lb

## 2022-08-30 DIAGNOSIS — I10 Essential (primary) hypertension: Secondary | ICD-10-CM

## 2022-08-30 DIAGNOSIS — I152 Hypertension secondary to endocrine disorders: Secondary | ICD-10-CM | POA: Diagnosis not present

## 2022-08-30 DIAGNOSIS — F17210 Nicotine dependence, cigarettes, uncomplicated: Secondary | ICD-10-CM

## 2022-08-30 DIAGNOSIS — Z794 Long term (current) use of insulin: Secondary | ICD-10-CM | POA: Diagnosis not present

## 2022-08-30 DIAGNOSIS — J449 Chronic obstructive pulmonary disease, unspecified: Secondary | ICD-10-CM | POA: Diagnosis not present

## 2022-08-30 DIAGNOSIS — E782 Mixed hyperlipidemia: Secondary | ICD-10-CM | POA: Diagnosis not present

## 2022-08-30 DIAGNOSIS — E1159 Type 2 diabetes mellitus with other circulatory complications: Secondary | ICD-10-CM | POA: Diagnosis not present

## 2022-08-30 DIAGNOSIS — E1165 Type 2 diabetes mellitus with hyperglycemia: Secondary | ICD-10-CM

## 2022-08-30 DIAGNOSIS — R69 Illness, unspecified: Secondary | ICD-10-CM | POA: Diagnosis not present

## 2022-08-30 LAB — BAYER DCA HB A1C WAIVED: HB A1C (BAYER DCA - WAIVED): 5.9 % — ABNORMAL HIGH (ref 4.8–5.6)

## 2022-08-30 MED ORDER — BUDESONIDE-FORMOTEROL FUMARATE 160-4.5 MCG/ACT IN AERO: INHALATION_SPRAY | RESPIRATORY_TRACT | 3 refills | Status: DC

## 2022-08-30 MED ORDER — POTASSIUM CHLORIDE CRYS ER 20 MEQ PO TBCR: EXTENDED_RELEASE_TABLET | ORAL | 3 refills | Status: DC

## 2022-08-30 MED ORDER — LOSARTAN POTASSIUM 100 MG PO TABS: 100.0000 mg | ORAL_TABLET | Freq: Every day | ORAL | 3 refills | Status: DC

## 2022-08-30 MED ORDER — ROSUVASTATIN CALCIUM 5 MG PO TABS
5.0000 mg | ORAL_TABLET | Freq: Every day | ORAL | 3 refills | Status: AC
Start: 2022-08-30 — End: ?

## 2022-08-30 NOTE — Progress Notes (Signed)
Subjective:  Patient ID: Juan Merritt,  male    DOB: Jun 13, 1951  Age: 71 y.o.    CC: Medical Management of Chronic Issues   HPI Juan Merritt presents for  follow-up of hypertension. Patient has no history of headache chest pain or shortness of breath or recent cough. Patient also denies symptoms of TIA such as numbness weakness lateralizing. Patient denies side effects from medication. States taking it regularly.  Patient also  in for follow-up of elevated cholesterol. Doing well without complaints on current medication. Denies side effects  including myalgia and arthralgia and nausea. Also in today for liver function testing. Currently no chest pain, shortness of breath or other cardiovascular related symptoms noted.  Follow-up of diabetes. Patient does  check blood sugar at home. Readings run between 85 and 110 Patient denies symptoms such as excessive hunger or urinary frequency, excessive hunger, nausea No significant hypoglycemic spells noted. Medications reviewed. Pt reports taking them regularly. Pt. denies complication/adverse reaction today.    History Juan Merritt has a past medical history of Arthritis, BPH associated with nocturia, Chronic gout, COPD (chronic obstructive pulmonary disease) ([place]), History of colon polyps, History of COVID-19, Hyperlipidemia, mixed, Hypertension, Phimosis, and Type 2 diabetes mellitus ([place]).   He has a past surgical history that includes Laparoscopic cholecystectomy (2008); Tonsillectomy; Colonoscopy (12/01/2021); and Circumcision (N/A, 05/19/2022).   His family history includes Asthma in his mother; Diabetes in his father; Esophageal cancer in his paternal uncle; Heart attack in an other family member; Heart attack (age of onset: 10) in his father; Leukemia in his brother; Lung disease in an other family member.He reports that he quit smoking about 5 years ago. His smoking use included cigarettes and e-cigarettes. He has a 10.00 pack-year  smoking history. His smokeless tobacco use includes chew. He reports current alcohol use. He reports that he does not use drugs.  Current Outpatient Medications on File Prior to Visit  Medication Sig Dispense Refill   albuterol (VENTOLIN HFA) 108 (90 Base) MCG/ACT inhaler Inhale 2 puffs into the lungs 2 (two) times daily. 18 g 5   allopurinol (ZYLOPRIM) 100 MG tablet TAKE 1 TABLET (100 MG TOTAL) BY MOUTH DAILY. TO PREVENT GOUT 90 tablet 3   amLODipine (NORVASC) 5 MG tablet Take 1 tablet (5 mg total) by mouth daily. (Patient taking differently: Take 5 mg by mouth daily.) 90 tablet 3   aspirin EC 81 MG tablet Take 81 mg by mouth daily. Swallow whole.     Blood Glucose Monitoring Suppl (ONETOUCH VERIO FLEX SYSTEM) w/Device KIT TEST BLOOD SUGAR 3 TIMES DAILY AS DIRECTED. DX: E11.65 1 kit 0   clotrimazole-betamethasone (LOTRISONE) cream Apply 1 application topically 2 (two) times daily. 30 g 0   Continuous Blood Gluc Sensor (FREESTYLE LIBRE 2 SENSOR) MISC USE TO TEST BLOOD SUGAR CONTINUOUSLY. Dx. E11.65 SEND TO [facility] FOR MEDICARE COVERAGE, DO NOT SEND TO LOCAL PHARMACY 2 each 0   Insulin Pen Needle 31G X 8 MM MISC Use to inject insulin daily. DX: E11.59 100 each 12   ipratropium-albuterol (DUONEB) 0.5-2.5 (3) MG/3ML SOLN Take 3 mLs by nebulization every 4 (four) hours as needed. (Patient taking differently: Take 3 mLs by nebulization every 4 (four) hours as needed (SOB).) 360 mL 3   meloxicam (MOBIC) 15 MG tablet TAKE 1 TABLET (15 MG TOTAL) BY MOUTH DAILY. 90 tablet 3   metFORMIN (GLUCOPHAGE-XR) 750 MG 24 hr tablet Take 1 tablet (750 mg total) by mouth daily with breakfast. 90 tablet  3   Microlet Lancets MISC Use to test blood sugar 3 times daily as directed.; DX:e.11.65 100 each 12   tamsulosin (FLOMAX) 0.4 MG CAPS capsule Take 2 capsules (0.8 mg total) by mouth at bedtime. For urine flow and prostate 180 capsule 3   torsemide (DEMADEX) 100 MG tablet Take 1 tablet (100 mg total) by mouth 2  (two) times daily. At arising and at lunch (Patient taking differently: Take 100 mg by mouth daily. At arising and at lunch) 180 tablet 2   No current facility-administered medications on file prior to visit.    ROS Review of Systems  Constitutional:  Negative for fever.  Respiratory:  Negative for shortness of breath.   Cardiovascular:  Negative for chest pain.  Musculoskeletal:  Negative for arthralgias.  Skin:  Negative for rash.    Objective:  BP 127/76   Pulse 73   Temp 97.8 F (36.6 C)   Ht _0  (1.778 m)   Wt 280 lb 12.8 oz (127.4 kg)   SpO2 93%   BMI 40.29 kg/m   BP Readings from Last 3 Encounters:  08/30/22 127/76  08/19/22 103/67  08/15/22 104/62    Wt Readings from Last 3 Encounters:  08/30/22 280 lb 12.8 oz (127.4 kg)  08/19/22 272 lb 4 oz (123.5 kg)  08/19/22 273 lb (123.8 kg)     Physical Exam Vitals reviewed.  Constitutional:      Appearance: He is well-developed. He is obese. He is not ill-appearing.  HENT:     Head: Normocephalic and atraumatic.     Right Ear: External ear normal.     Left Ear: External ear normal.     Mouth/Throat:     Pharynx: No oropharyngeal exudate or posterior oropharyngeal erythema.  Eyes:     Pupils: Pupils are equal, round, and reactive to light.  Cardiovascular:     Rate and Rhythm: Normal rate and regular rhythm.     Heart sounds: No murmur heard. Pulmonary:     Effort: No respiratory distress.     Breath sounds: Normal breath sounds.  Abdominal:     General: Bowel sounds are normal.  Musculoskeletal:     Cervical back: Normal range of motion and neck supple.  Neurological:     Mental Status: He is alert and oriented to person, place, and time.     Diabetic Foot Exam - Simple   No data filed     Lab Results  Component Value Date   HGBA1C 5.5 05/25/2022   HGBA1C 5.5 02/22/2022   HGBA1C 5.7 (H) 11/23/2021    Assessment & Plan:   Juan Merritt was seen today for medical management of chronic  issues.  Diagnoses and all orders for this visit:  Type 2 diabetes mellitus with hyperglycemia, with long-term current use of insulin ([place]) -     Bayer DCA Hb A1c Waived  Essential hypertension, benign -     CMP14+EGFR -     CBC with Differential/Platelet -     losartan (COZAAR) 100 MG tablet; Take 1 tablet (100 mg total) by mouth daily. -     potassium chloride SA (KLOR-CON M20) 20 MEQ tablet; TAKE 1 TABLET (20 MEQ TOTAL) DAILY BY MOUTH.  Mixed hyperlipidemia -     Lipid panel  COPD mixed type ([facility]) -     budesonide-formoterol (SYMBICORT) 160-4.5 MCG/ACT inhaler; INHALE 2 PUFFS INTO THE LUNGS IN THE MORNING AND AT BEDTIME. TAKE 2 PUFFS BY MOUTH TWICE A DAY  Hypertension associated  with diabetes ([place]) -     losartan (COZAAR) 100 MG tablet; Take 1 tablet (100 mg total) by mouth daily.  Smokes less than 2 packs a day with greater than 40 pack year history -     CT CHEST LUNG CANCER SCREENING LOW DOSE WO CONTRAST; Future  Other orders -     rosuvastatin (CRESTOR) 5 MG tablet; Take 1 tablet (5 mg total) by mouth daily. For cholesterol   I am having Juan Merritt. Soward maintain his ipratropium-albuterol, aspirin EC, albuterol, Insulin Pen Needle, FreeStyle Libre 2 Sensor, Microlet Lancets, amLODipine, clotrimazole-betamethasone, torsemide, tamsulosin, metFORMIN, OneTouch Verio Flex System, meloxicam, allopurinol, budesonide-formoterol, losartan, potassium chloride SA, and rosuvastatin.  Meds ordered this encounter  Medications   budesonide-formoterol (SYMBICORT) 160-4.5 MCG/ACT inhaler    Sig: INHALE 2 PUFFS INTO THE LUNGS IN THE MORNING AND AT BEDTIME. TAKE 2 PUFFS BY MOUTH TWICE A DAY    Dispense:  30.6 each    Refill:  3   losartan (COZAAR) 100 MG tablet    Sig: Take 1 tablet (100 mg total) by mouth daily.    Dispense:  90 tablet    Refill:  3   potassium chloride SA (KLOR-CON M20) 20 MEQ tablet    Sig: TAKE 1 TABLET (20 MEQ TOTAL) DAILY BY MOUTH.    Dispense:  90 tablet     Refill:  3   rosuvastatin (CRESTOR) 5 MG tablet    Sig: Take 1 tablet (5 mg total) by mouth daily. For cholesterol    Dispense:  90 tablet    Refill:  3     Follow-up: Return in about 3 months (around 11/29/2022).  [name], M.D.

## 2022-08-31 LAB — CMP14+EGFR
ALT: 18 IU/L (ref 0–44)
AST: 15 IU/L (ref 0–40)
Albumin/Globulin Ratio: 1.4 (ref 1.2–2.2)
Albumin: 4 g/dL (ref 3.9–4.9)
Alkaline Phosphatase: 49 IU/L (ref 44–121)
BUN/Creatinine Ratio: 21 (ref 10–24)
BUN: 23 mg/dL (ref 8–27)
Bilirubin Total: 0.3 mg/dL (ref 0.0–1.2)
CO2: 28 mmol/L (ref 20–29)
Calcium: 8.9 mg/dL (ref 8.6–10.2)
Chloride: 102 mmol/L (ref 96–106)
Creatinine, Ser: 1.08 mg/dL (ref 0.76–1.27)
Globulin, Total: 2.8 g/dL (ref 1.5–4.5)
Glucose: 109 mg/dL — ABNORMAL HIGH (ref 70–99)
Potassium: 4.1 mmol/L (ref 3.5–5.2)
Sodium: 143 mmol/L (ref 134–144)
Total Protein: 6.8 g/dL (ref 6.0–8.5)
eGFR: 74 mL/min/{1.73_m2} (ref >59)

## 2022-08-31 LAB — CBC WITH DIFFERENTIAL/PLATELET
Basophils Absolute: 0 10*3/uL (ref 0.0–0.2)
Basos: 1 %
EOS (ABSOLUTE): 0.4 10*3/uL (ref 0.0–0.4)
Eos: 7 %
Hematocrit: 40.7 % (ref 37.5–51.0)
Hemoglobin: 13.4 g/dL (ref 13.0–17.7)
Immature Grans (Abs): 0 10*3/uL (ref 0.0–0.1)
Immature Granulocytes: 0 %
Lymphocytes Absolute: 2.6 10*3/uL (ref 0.7–3.1)
Lymphs: 42 %
MCH: 31.7 pg (ref 26.6–33.0)
MCHC: 32.9 g/dL (ref 31.5–35.7)
MCV: 96 fL (ref 79–97)
Monocytes Absolute: 0.6 10*3/uL (ref 0.1–0.9)
Monocytes: 10 %
Neutrophils Absolute: 2.4 10*3/uL (ref 1.4–7.0)
Neutrophils: 40 %
Platelets: 184 10*3/uL (ref 150–450)
RBC: 4.23 x10E6/uL (ref 4.14–5.80)
RDW: 13.3 % (ref 11.6–15.4)
WBC: 6.1 10*3/uL (ref 3.4–10.8)

## 2022-08-31 LAB — LIPID PANEL
Chol/HDL Ratio: 2.6 ratio (ref 0.0–5.0)
Cholesterol, Total: 152 mg/dL (ref 100–199)
HDL: 59 mg/dL (ref >39)
LDL Chol Calc (NIH): 75 mg/dL (ref 0–99)
Triglycerides: 99 mg/dL (ref 0–149)
VLDL Cholesterol Cal: 18 mg/dL (ref 5–40)

## 2022-08-31 NOTE — Progress Notes (Signed)
Hello Juan Merritt,  Your lab result is normal and/or stable.Some minor variations that are not significant are commonly marked abnormal, but do not represent any medical problem for you.  Best regards, Allon Costlow, M.D.

## 2022-09-08 DIAGNOSIS — E1165 Type 2 diabetes mellitus with hyperglycemia: Secondary | ICD-10-CM | POA: Diagnosis not present

## 2022-10-09 DIAGNOSIS — E1165 Type 2 diabetes mellitus with hyperglycemia: Secondary | ICD-10-CM | POA: Diagnosis not present

## 2022-11-09 DIAGNOSIS — E1165 Type 2 diabetes mellitus with hyperglycemia: Secondary | ICD-10-CM | POA: Diagnosis not present

## 2022-11-30 ENCOUNTER — Encounter: Payer: Self-pay | Admitting: Family Medicine

## 2022-11-30 ENCOUNTER — Ambulatory Visit (INDEPENDENT_AMBULATORY_CARE_PROVIDER_SITE_OTHER): Payer: Medicare HMO | Admitting: Family Medicine

## 2022-11-30 VITALS — BP 108/61 | HR 74 | Temp 98.0°F | Ht 70.0 in | Wt 290.0 lb

## 2022-11-30 DIAGNOSIS — I10 Essential (primary) hypertension: Secondary | ICD-10-CM | POA: Diagnosis not present

## 2022-11-30 DIAGNOSIS — E782 Mixed hyperlipidemia: Secondary | ICD-10-CM | POA: Diagnosis not present

## 2022-11-30 DIAGNOSIS — E1165 Type 2 diabetes mellitus with hyperglycemia: Secondary | ICD-10-CM

## 2022-11-30 DIAGNOSIS — Z794 Long term (current) use of insulin: Secondary | ICD-10-CM

## 2022-11-30 LAB — BAYER DCA HB A1C WAIVED: HB A1C (BAYER DCA - WAIVED): 6.4 % — ABNORMAL HIGH (ref 4.8–5.6)

## 2022-11-30 MED ORDER — AMLODIPINE BESYLATE 5 MG PO TABS: 5.0000 mg | ORAL_TABLET | Freq: Every day | ORAL | 3 refills | Status: DC

## 2022-11-30 NOTE — Progress Notes (Signed)
Subjective:  Patient ID: Juan Merritt,  male    DOB: 08-Apr-1951  Age: 71 y.o.    CC: Medical Management of Chronic Issues   HPI Jarquez Mestre Pudlo presents for  follow-up of hypertension. Patient has no history of headache chest pain or shortness of breath or recent cough. Patient also denies symptoms of TIA such as numbness weakness lateralizing. Patient denies side effects from medication. States taking it regularly.  Patient also  in for follow-up of elevated cholesterol. Doing well without complaints on current medication. Denies side effects  including myalgia and arthralgia and nausea. Also in today for liver function testing. Currently no chest pain, shortness of breath or other cardiovascular related symptoms noted.  Follow-up of diabetes. Patient denies symptoms such as excessive hunger or urinary frequency, excessive hunger, nausea No significant hypoglycemic spells noted. Medications reviewed. Pt reports taking them regularly. Pt. denies complication/adverse reaction today.    History Yacoub has a past medical history of Arthritis, BPH associated with nocturia, Chronic gout, COPD (chronic obstructive pulmonary disease) ([place]), History of colon polyps, History of COVID-19, Hyperlipidemia, mixed, Hypertension, Phimosis, and Type 2 diabetes mellitus ([place]).   He has a past surgical history that includes Laparoscopic cholecystectomy (2008); Tonsillectomy; Colonoscopy (12/01/2021); and Circumcision (N/A, 05/19/2022).   His family history includes Asthma in his mother; Diabetes in his father; Esophageal cancer in his paternal uncle; Heart attack in an other family member; Heart attack (age of onset: 27) in his father; Leukemia in his brother; Lung disease in an other family member.He reports that he quit smoking about 5 years ago. His smoking use included cigarettes and e-cigarettes. He has a 10.00 pack-year smoking history. His smokeless tobacco use includes chew. He reports current  alcohol use. He reports that he does not use drugs.  Current Outpatient Medications on File Prior to Visit  Medication Sig Dispense Refill   albuterol (VENTOLIN HFA) 108 (90 Base) MCG/ACT inhaler Inhale 2 puffs into the lungs 2 (two) times daily. 18 g 5   allopurinol (ZYLOPRIM) 100 MG tablet TAKE 1 TABLET (100 MG TOTAL) BY MOUTH DAILY. TO PREVENT GOUT 90 tablet 3   aspirin EC 81 MG tablet Take 81 mg by mouth daily. Swallow whole.     Blood Glucose Monitoring Suppl (ONETOUCH VERIO FLEX SYSTEM) w/Device KIT TEST BLOOD SUGAR 3 TIMES DAILY AS DIRECTED. DX: E11.65 1 kit 0   budesonide-formoterol (SYMBICORT) 160-4.5 MCG/ACT inhaler INHALE 2 PUFFS INTO THE LUNGS IN THE MORNING AND AT BEDTIME. TAKE 2 PUFFS BY MOUTH TWICE A DAY 30.6 each 3   clotrimazole-betamethasone (LOTRISONE) cream Apply 1 application topically 2 (two) times daily. 30 g 0   Continuous Blood Gluc Sensor (FREESTYLE LIBRE 2 SENSOR) MISC USE TO TEST BLOOD SUGAR CONTINUOUSLY. Dx. E11.65 SEND TO [facility] FOR MEDICARE COVERAGE, DO NOT SEND TO LOCAL PHARMACY 2 each 0   Insulin Pen Needle 31G X 8 MM MISC Use to inject insulin daily. DX: E11.59 100 each 12   ipratropium-albuterol (DUONEB) 0.5-2.5 (3) MG/3ML SOLN Take 3 mLs by nebulization every 4 (four) hours as needed. (Patient taking differently: Take 3 mLs by nebulization every 4 (four) hours as needed (SOB).) 360 mL 3   losartan (COZAAR) 100 MG tablet Take 1 tablet (100 mg total) by mouth daily. 90 tablet 3   meloxicam (MOBIC) 15 MG tablet TAKE 1 TABLET (15 MG TOTAL) BY MOUTH DAILY. 90 tablet 3   metFORMIN (GLUCOPHAGE-XR) 750 MG 24 hr tablet Take 1 tablet (750 mg total)  by mouth daily with breakfast. 90 tablet 3   Microlet Lancets MISC Use to test blood sugar 3 times daily as directed.; DX:e.11.65 100 each 12   potassium chloride SA (KLOR-CON M20) 20 MEQ tablet TAKE 1 TABLET (20 MEQ TOTAL) DAILY BY MOUTH. 90 tablet 3   rosuvastatin (CRESTOR) 5 MG tablet Take 1 tablet (5 mg total) by  mouth daily. For cholesterol 90 tablet 3   tamsulosin (FLOMAX) 0.4 MG CAPS capsule Take 2 capsules (0.8 mg total) by mouth at bedtime. For urine flow and prostate 180 capsule 3   torsemide (DEMADEX) 100 MG tablet Take 1 tablet (100 mg total) by mouth 2 (two) times daily. At arising and at lunch (Patient taking differently: Take 100 mg by mouth daily. At arising and at lunch) 180 tablet 2   No current facility-administered medications on file prior to visit.    ROS Review of Systems  Constitutional:  Negative for fever.  Respiratory:  Negative for shortness of breath.   Cardiovascular:  Negative for chest pain.  Musculoskeletal:  Negative for arthralgias.  Skin:  Negative for rash.    Objective:  BP 108/61   Pulse 74   Temp 98 F (36.7 C)   Ht _0  (1.778 m)   Wt 290 lb (131.5 kg)   SpO2 94%   BMI 41.61 kg/m   BP Readings from Last 3 Encounters:  11/30/22 108/61  08/30/22 127/76  08/19/22 103/67    Wt Readings from Last 3 Encounters:  11/30/22 290 lb (131.5 kg)  08/30/22 280 lb 12.8 oz (127.4 kg)  08/19/22 272 lb 4 oz (123.5 kg)     Physical Exam Vitals reviewed.  Constitutional:      Appearance: He is well-developed.  HENT:     Head: Normocephalic and atraumatic.     Right Ear: External ear normal.     Left Ear: External ear normal.     Mouth/Throat:     Pharynx: No oropharyngeal exudate or posterior oropharyngeal erythema.  Eyes:     Pupils: Pupils are equal, round, and reactive to light.  Cardiovascular:     Rate and Rhythm: Normal rate and regular rhythm.     Heart sounds: No murmur heard. Pulmonary:     Effort: No respiratory distress.     Breath sounds: Normal breath sounds.  Musculoskeletal:     Cervical back: Normal range of motion and neck supple.  Neurological:     Mental Status: He is alert and oriented to person, place, and time.     Diabetic Foot Exam - Simple   No data filed     Lab Results  Component Value Date   HGBA1C 6.4 (H)  11/30/2022   HGBA1C 5.9 (H) 08/30/2022   HGBA1C 5.5 05/25/2022    Assessment & Plan:   Lloyd was seen today for medical management of chronic issues.  Diagnoses and all orders for this visit:  Type 2 diabetes mellitus with hyperglycemia, with long-term current use of insulin ([place]) -     Bayer DCA Hb A1c Waived -     Microalbumin / creatinine urine ratio  Essential hypertension, benign -     CBC with Differential/Platelet -     CMP14+EGFR -     amLODipine (NORVASC) 5 MG tablet; Take 1 tablet (5 mg total) by mouth daily.  Mixed hyperlipidemia -     Lipid panel   I am having Benson Setting. Mccalister maintain his ipratropium-albuterol, aspirin EC, albuterol, Insulin Pen Needle, FreeStyle [employer]  2 Sensor, Microlet Lancets, clotrimazole-betamethasone, torsemide, tamsulosin, metFORMIN, OneTouch Verio Flex System, meloxicam, allopurinol, budesonide-formoterol, losartan, potassium chloride SA, rosuvastatin, and amLODipine.  Meds ordered this encounter  Medications   amLODipine (NORVASC) 5 MG tablet    Sig: Take 1 tablet (5 mg total) by mouth daily.    Dispense:  90 tablet    Refill:  3     Follow-up: Return in about 3 months (around 03/01/2023).  [name], M.D.

## 2022-12-01 ENCOUNTER — Ambulatory Visit (INDEPENDENT_AMBULATORY_CARE_PROVIDER_SITE_OTHER): Payer: Medicare HMO

## 2022-12-01 DIAGNOSIS — Z23 Encounter for immunization: Secondary | ICD-10-CM | POA: Diagnosis not present

## 2022-12-01 LAB — LIPID PANEL
Chol/HDL Ratio: 2.6 ratio (ref 0.0–5.0)
Cholesterol, Total: 137 mg/dL (ref 100–199)
HDL: 52 mg/dL (ref >39)
LDL Chol Calc (NIH): 65 mg/dL (ref 0–99)
Triglycerides: 108 mg/dL (ref 0–149)
VLDL Cholesterol Cal: 20 mg/dL (ref 5–40)

## 2022-12-01 LAB — CMP14+EGFR
ALT: 17 IU/L (ref 0–44)
AST: 15 IU/L (ref 0–40)
Albumin/Globulin Ratio: 1.5 (ref 1.2–2.2)
Albumin: 4.1 g/dL (ref 3.9–4.9)
Alkaline Phosphatase: 51 IU/L (ref 44–121)
BUN/Creatinine Ratio: 18 (ref 10–24)
BUN: 22 mg/dL (ref 8–27)
Bilirubin Total: 0.3 mg/dL (ref 0.0–1.2)
CO2: 23 mmol/L (ref 20–29)
Calcium: 9.1 mg/dL (ref 8.6–10.2)
Chloride: 102 mmol/L (ref 96–106)
Creatinine, Ser: 1.2 mg/dL (ref 0.76–1.27)
Globulin, Total: 2.8 g/dL (ref 1.5–4.5)
Glucose: 112 mg/dL — ABNORMAL HIGH (ref 70–99)
Potassium: 4 mmol/L (ref 3.5–5.2)
Sodium: 143 mmol/L (ref 134–144)
Total Protein: 6.9 g/dL (ref 6.0–8.5)
eGFR: 65 mL/min/{1.73_m2} (ref >59)

## 2022-12-01 LAB — CBC WITH DIFFERENTIAL/PLATELET
Basophils Absolute: 0 10*3/uL (ref 0.0–0.2)
Basos: 1 %
EOS (ABSOLUTE): 0.2 10*3/uL (ref 0.0–0.4)
Eos: 4 %
Hematocrit: 40.9 % (ref 37.5–51.0)
Hemoglobin: 13.9 g/dL (ref 13.0–17.7)
Immature Grans (Abs): 0 10*3/uL (ref 0.0–0.1)
Immature Granulocytes: 0 %
Lymphocytes Absolute: 2.6 10*3/uL (ref 0.7–3.1)
Lymphs: 43 %
MCH: 32.1 pg (ref 26.6–33.0)
MCHC: 34 g/dL (ref 31.5–35.7)
MCV: 95 fL (ref 79–97)
Monocytes Absolute: 0.5 10*3/uL (ref 0.1–0.9)
Monocytes: 9 %
Neutrophils Absolute: 2.6 10*3/uL (ref 1.4–7.0)
Neutrophils: 43 %
Platelets: 199 10*3/uL (ref 150–450)
RBC: 4.33 x10E6/uL (ref 4.14–5.80)
RDW: 12.8 % (ref 11.6–15.4)
WBC: 6 10*3/uL (ref 3.4–10.8)

## 2022-12-04 NOTE — Progress Notes (Signed)
Hello Moritz,  Your lab result is normal and/or stable.Some minor variations that are not significant are commonly marked abnormal, but do not represent any medical problem for you.  Best regards, [name], M.D.

## 2022-12-10 DIAGNOSIS — E1165 Type 2 diabetes mellitus with hyperglycemia: Secondary | ICD-10-CM | POA: Diagnosis not present

## 2022-12-15 ENCOUNTER — Other Ambulatory Visit: Payer: Self-pay | Admitting: Family Medicine

## 2022-12-15 DIAGNOSIS — J449 Chronic obstructive pulmonary disease, unspecified: Secondary | ICD-10-CM

## 2022-12-23 ENCOUNTER — Telehealth: Payer: Self-pay | Admitting: Family Medicine

## 2022-12-23 ENCOUNTER — Other Ambulatory Visit: Payer: Self-pay | Admitting: Family Medicine

## 2022-12-23 MED ORDER — FLUTICASONE-SALMETEROL 100-50 MCG/ACT IN AEPB: 1.0000 | INHALATION_SPRAY | Freq: Two times a day (BID) | RESPIRATORY_TRACT | 3 refills | Status: DC

## 2022-12-23 NOTE — Telephone Encounter (Signed)
Please let the patient know that I sent their prescription to their pharmacy. Thanks, WS 

## 2022-12-26 NOTE — Telephone Encounter (Signed)
/  vmb not set up

## 2023-01-10 ENCOUNTER — Other Ambulatory Visit: Payer: Self-pay | Admitting: Family Medicine

## 2023-01-10 DIAGNOSIS — E1165 Type 2 diabetes mellitus with hyperglycemia: Secondary | ICD-10-CM | POA: Diagnosis not present

## 2023-01-18 ENCOUNTER — Ambulatory Visit (INDEPENDENT_AMBULATORY_CARE_PROVIDER_SITE_OTHER): Payer: Medicare HMO | Admitting: Family Medicine

## 2023-01-18 ENCOUNTER — Ambulatory Visit (INDEPENDENT_AMBULATORY_CARE_PROVIDER_SITE_OTHER): Payer: Medicare HMO

## 2023-01-18 ENCOUNTER — Encounter: Payer: Self-pay | Admitting: Family Medicine

## 2023-01-18 ENCOUNTER — Other Ambulatory Visit: Payer: Self-pay | Admitting: Family Medicine

## 2023-01-18 VITALS — BP 111/66 | HR 90 | Temp 97.9°F | Ht 70.0 in | Wt 298.6 lb

## 2023-01-18 DIAGNOSIS — M79642 Pain in left hand: Secondary | ICD-10-CM | POA: Diagnosis not present

## 2023-01-18 DIAGNOSIS — R059 Cough, unspecified: Secondary | ICD-10-CM | POA: Diagnosis not present

## 2023-01-18 DIAGNOSIS — R0602 Shortness of breath: Secondary | ICD-10-CM | POA: Diagnosis not present

## 2023-01-18 DIAGNOSIS — R0600 Dyspnea, unspecified: Secondary | ICD-10-CM | POA: Diagnosis not present

## 2023-01-18 DIAGNOSIS — M7989 Other specified soft tissue disorders: Secondary | ICD-10-CM | POA: Diagnosis not present

## 2023-01-18 MED ORDER — BETAMETHASONE SOD PHOS & ACET 6 (3-3) MG/ML IJ SUSP
6.0000 mg | Freq: Once | INTRAMUSCULAR | Status: AC
  Administered 2023-01-18: 6 mg via INTRAMUSCULAR

## 2023-01-18 MED ORDER — COLCHICINE 0.6 MG PO TABS: ORAL_TABLET | ORAL | 2 refills | Status: DC

## 2023-01-18 MED ORDER — FLUTICASONE-SALMETEROL 500-50 MCG/ACT IN AEPB: 1.0000 | INHALATION_SPRAY | Freq: Two times a day (BID) | RESPIRATORY_TRACT | 3 refills | Status: DC

## 2023-01-18 NOTE — Progress Notes (Signed)
Chief Complaint  Patient presents with   Cough   Nasal Congestion   Shortness of Breath   Hand Pain    Left long and ring fingers throbbing    HPI  Patient presents today for cough that is gotten worse since he switched from the Symbicort to the Advair.  He is significantly more short of breath.  He feels like the Advair is not effective for him.  He is somewhat congested nasally.  No fever no purulent discharge noted.  His left third and fourth fingers are swollen and throbbing.  He has a history of gout.  PMH: Smoking status noted ROS: Per HPI  Objective: BP 111/66   Pulse 90   Temp 97.9 F (36.6 C)   Ht '5\' 10"'$  (1.778 m)   Wt 298 lb 9.6 oz (135.4 kg)   SpO2 93%   BMI 42.84 kg/m  Gen: NAD, alert, cooperative with exam HEENT: NCAT, EOMI, PERRL CV: RRR, good S1/S2, no murmur Resp: distant breath sounds. Few rhonchi YNW:GNFA Left hand has mild erythema 3&4 digits have edema and marked tenderness Neuro: Alert and oriented, No gross deficits  Assessment and plan:  1. SOB (shortness of breath)   2. Hand pain, left     Meds ordered this encounter  Medications   betamethasone acetate-betamethasone sodium phosphate (CELESTONE) injection 6 mg   colchicine 0.6 MG tablet    Sig: Take twice daily for gout attack. (may take every two hours up to 6 doses at acute onset)    Dispense:  60 tablet    Refill:  2   fluticasone-salmeterol (ADVAIR) 500-50 MCG/ACT AEPB    Sig: Inhale 1 puff into the lungs in the morning and at bedtime.    Dispense:  180 each    Refill:  3    Orders Placed This Encounter  Procedures   COVID-19, Flu A+B and RSV    Order Specific Question:   Previously tested for COVID-19    Answer:   Unknown    Order Specific Question:   Resident in a congregate (group) care setting    Answer:   No    Order Specific Question:   Is the patient student?    Answer:   No    Order Specific Question:   Employed in healthcare setting    Answer:   No    Order Specific  Question:   Has patient completed COVID vaccination(s) (2 doses of Pfizer/Moderna 1 dose of [employer])    Answer:   Unknown   DG Hand Complete Left    Standing Status:   Future    Number of Occurrences:   1    Standing Expiration Date:   02/16/2023    Order Specific Question:   Reason for Exam (SYMPTOM  OR DIAGNOSIS REQUIRED)    Answer:   swelling and pain    Order Specific Question:   Preferred imaging location?    Answer:   Internal   DG Chest 2 View    Standing Status:   Future    Number of Occurrences:   1    Standing Expiration Date:   02/16/2023    Order Specific Question:   Reason for Exam (SYMPTOM  OR DIAGNOSIS REQUIRED)    Answer:   cough, dyspnea, COPD    Order Specific Question:   Preferred imaging location?    Answer:   Internal    Follow up as needed.  [name], MD

## 2023-01-19 LAB — COVID-19, FLU A+B AND RSV
Influenza A, NAA: NOT DETECTED
Influenza B, NAA: NOT DETECTED
RSV, NAA: NOT DETECTED
SARS-CoV-2, NAA: NOT DETECTED

## 2023-01-30 ENCOUNTER — Telehealth: Payer: Self-pay | Admitting: Family Medicine

## 2023-01-30 NOTE — Telephone Encounter (Signed)
Pt called stating that he recently had a visit with Dr Livia Snellen about his hand and was told that he had a gout flare up which was causing the pain. Pt says he was prescribed some medicine to take for it but pt is still in pain. Wants to know if he has to be seen again for this or can he just increase his medicine?  Pt aware that Dr Livia Snellen is out because of jury duty. Can covering provider advise?

## 2023-01-31 ENCOUNTER — Other Ambulatory Visit: Payer: Self-pay | Admitting: Family Medicine

## 2023-01-31 MED ORDER — PREDNISONE 10 MG PO TABS: ORAL_TABLET | ORAL | 0 refills | Status: DC

## 2023-01-31 NOTE — Telephone Encounter (Signed)
Patient aware.

## 2023-01-31 NOTE — Telephone Encounter (Signed)
The next step up is prednisone. I sent that in for pt.

## 2023-01-31 NOTE — Telephone Encounter (Signed)
Pt calling today about this message. Aware St[facility] will address

## 2023-02-10 DIAGNOSIS — E1165 Type 2 diabetes mellitus with hyperglycemia: Secondary | ICD-10-CM | POA: Diagnosis not present

## 2023-02-17 ENCOUNTER — Other Ambulatory Visit: Payer: Self-pay | Admitting: Family Medicine

## 2023-03-01 ENCOUNTER — Ambulatory Visit (INDEPENDENT_AMBULATORY_CARE_PROVIDER_SITE_OTHER): Payer: Medicare HMO | Admitting: Family Medicine

## 2023-03-01 ENCOUNTER — Encounter: Payer: Self-pay | Admitting: Family Medicine

## 2023-03-01 VITALS — BP 115/68 | HR 88 | Temp 98.3°F | Ht 70.0 in | Wt 295.0 lb

## 2023-03-01 DIAGNOSIS — Z6841 Body Mass Index (BMI) 40.0 and over, adult: Secondary | ICD-10-CM

## 2023-03-01 DIAGNOSIS — I10 Essential (primary) hypertension: Secondary | ICD-10-CM | POA: Diagnosis not present

## 2023-03-01 DIAGNOSIS — E782 Mixed hyperlipidemia: Secondary | ICD-10-CM | POA: Diagnosis not present

## 2023-03-01 DIAGNOSIS — Z794 Long term (current) use of insulin: Secondary | ICD-10-CM

## 2023-03-01 DIAGNOSIS — E1165 Type 2 diabetes mellitus with hyperglycemia: Secondary | ICD-10-CM

## 2023-03-01 LAB — BAYER DCA HB A1C WAIVED: HB A1C (BAYER DCA - WAIVED): 6.2 % — ABNORMAL HIGH (ref 4.8–5.6)

## 2023-03-01 LAB — LIPID PANEL

## 2023-03-01 NOTE — Progress Notes (Signed)
Subjective:  Patient ID: Juan Merritt,  male    DOB: Apr 27, 1951  Age: 72 y.o.    CC: Medical Management of Chronic Issues   HPI Charbel Mccleaf Jenning presents for  follow-up of hypertension. Patient has no history of headache chest pain or shortness of breath or recent cough. Patient also denies symptoms of TIA such as numbness weakness lateralizing. Patient denies side effects from medication. States taking it regularly.  Patient also  in for follow-up of elevated cholesterol. Doing well without complaints on current medication. Denies side effects  including myalgia and arthralgia and nausea. Also in today for liver function testing. Currently no chest pain, shortness of breath or other cardiovascular related symptoms noted.  Follow-up of diabetes. Patient does check blood sugar at home. Readings run between 104 and 111 avg for 90 days. Went to 190 with steroid tx.  Patient denies symptoms such as excessive hunger or urinary frequency, excessive hunger, nausea No significant hypoglycemic spells noted. Medications reviewed. Pt reports taking them regularly. Pt. denies complication/adverse reaction today.    History Tri has a past medical history of Arthritis, BPH associated with nocturia, Chronic gout, COPD (chronic obstructive pulmonary disease) ([place]), History of colon polyps, History of COVID-19, Hyperlipidemia, mixed, Hypertension, Phimosis, and Type 2 diabetes mellitus ([place]).   He has a past surgical history that includes Laparoscopic cholecystectomy (2008); Tonsillectomy; Colonoscopy (12/01/2021); and Circumcision (N/A, 05/19/2022).   His family history includes Asthma in his mother; Diabetes in his father; Esophageal cancer in his paternal uncle; Heart attack in an other family member; Heart attack (age of onset: 4) in his father; Leukemia in his brother; Lung disease in an other family member.He reports that he quit smoking about 6 years ago. His smoking use included cigarettes  and e-cigarettes. He has a 10.00 pack-year smoking history. His smokeless tobacco use includes chew. He reports current alcohol use. He reports that he does not use drugs.  Current Outpatient Medications on File Prior to Visit  Medication Sig Dispense Refill   albuterol (VENTOLIN HFA) 108 (90 Base) MCG/ACT inhaler Inhale 2 puffs into the lungs 2 (two) times daily. 18 g 5   allopurinol (ZYLOPRIM) 100 MG tablet TAKE 1 TABLET (100 MG TOTAL) BY MOUTH DAILY. TO PREVENT GOUT 90 tablet 3   amLODipine (NORVASC) 5 MG tablet Take 1 tablet (5 mg total) by mouth daily. 90 tablet 3   aspirin EC 81 MG tablet Take 81 mg by mouth daily. Swallow whole.     clotrimazole-betamethasone (LOTRISONE) cream Apply 1 application topically 2 (two) times daily. 30 g 0   colchicine 0.6 MG tablet Take twice daily for gout attack. (may take every two hours up to 6 doses at acute onset) 60 tablet 2   Continuous Blood Gluc Sensor (FREESTYLE LIBRE 2 SENSOR) MISC USE TO TEST BLOOD SUGAR CONTINUOUSLY. Dx. E11.65 SEND TO [facility] FOR MEDICARE COVERAGE, DO NOT SEND TO LOCAL PHARMACY 2 each 0   fluticasone-salmeterol (ADVAIR) 500-50 MCG/ACT AEPB Inhale 1 puff into the lungs in the morning and at bedtime. 180 each 3   Insulin Pen Needle 31G X 8 MM MISC Use to inject insulin daily. DX: E11.59 100 each 12   ipratropium-albuterol (DUONEB) 0.5-2.5 (3) MG/3ML SOLN Take 3 mLs by nebulization every 4 (four) hours as needed. (Patient taking differently: Take 3 mLs by nebulization every 4 (four) hours as needed (SOB).) 360 mL 3   losartan (COZAAR) 100 MG tablet Take 1 tablet (100 mg total) by mouth daily.  90 tablet 3   meloxicam (MOBIC) 15 MG tablet TAKE 1 TABLET (15 MG TOTAL) BY MOUTH DAILY. 90 tablet 3   metFORMIN (GLUCOPHAGE-XR) 750 MG 24 hr tablet TAKE 1 TABLET BY MOUTH EVERY DAY WITH BREAKFAST 90 tablet 0   potassium chloride SA (KLOR-CON M20) 20 MEQ tablet TAKE 1 TABLET (20 MEQ TOTAL) DAILY BY MOUTH. 90 tablet 3   rosuvastatin  (CRESTOR) 5 MG tablet Take 1 tablet (5 mg total) by mouth daily. For cholesterol 90 tablet 3   tamsulosin (FLOMAX) 0.4 MG CAPS capsule TAKE 2 CAPSULES (0.8 MG TOTAL) BY MOUTH AT BEDTIME. FOR URINE FLOW AND PROSTATE 180 capsule 0   torsemide (DEMADEX) 100 MG tablet Take 1 tablet (100 mg total) by mouth 2 (two) times daily. At arising and at lunch (Patient taking differently: Take 100 mg by mouth daily. At arising and at lunch) 180 tablet 2   No current facility-administered medications on file prior to visit.    ROS Review of Systems  Constitutional:  Negative for fever.  Respiratory:  Negative for shortness of breath.   Cardiovascular:  Negative for chest pain.  Musculoskeletal:  Negative for arthralgias.  Skin:  Negative for rash.    Objective:  BP 115/68   Pulse 88   Temp 98.3 F (36.8 C)   Ht '5\' 10"'$  (1.778 m)   Wt 295 lb (133.8 kg)   SpO2 93%   BMI 42.33 kg/m   BP Readings from Last 3 Encounters:  03/01/23 115/68  01/18/23 111/66  11/30/22 108/61    Wt Readings from Last 3 Encounters:  03/01/23 295 lb (133.8 kg)  01/18/23 298 lb 9.6 oz (135.4 kg)  11/30/22 290 lb (131.5 kg)     Physical Exam Vitals reviewed.  Constitutional:      Appearance: He is well-developed. He is obese.  HENT:     Head: Normocephalic and atraumatic.     Right Ear: External ear normal.     Left Ear: External ear normal.     Mouth/Throat:     Pharynx: No oropharyngeal exudate or posterior oropharyngeal erythema.  Eyes:     Pupils: Pupils are equal, round, and reactive to light.  Cardiovascular:     Rate and Rhythm: Normal rate and regular rhythm.     Heart sounds: No murmur heard. Pulmonary:     Effort: No respiratory distress.     Breath sounds: Normal breath sounds.  Musculoskeletal:     Cervical back: Normal range of motion and neck supple.  Neurological:     Mental Status: He is alert and oriented to person, place, and time.        Lab Results  Component Value Date    HGBA1C 6.4 (H) 11/30/2022   HGBA1C 5.9 (H) 08/30/2022   HGBA1C 5.5 05/25/2022    Assessment & Plan:   Yugo was seen today for medical management of chronic issues.  Diagnoses and all orders for this visit:  Type 2 diabetes mellitus with hyperglycemia, with long-term current use of insulin ([place]) -     Bayer DCA Hb A1c Waived -     Microalbumin / creatinine urine ratio  Essential hypertension, benign -     CBC with Differential/Platelet -     CMP14+EGFR  Mixed hyperlipidemia -     Lipid panel  Morbid obesity ([facility])   I have discontinued Benson Setting. [place] Microlet Lancets, OneTouch Verio Flex System, OneTouch Ultra, and predniSONE. I am also having him maintain his ipratropium-albuterol, aspirin EC,  albuterol, Insulin Pen Needle, FreeStyle Libre 2 Sensor, clotrimazole-betamethasone, torsemide, meloxicam, allopurinol, losartan, potassium chloride SA, rosuvastatin, amLODipine, colchicine, fluticasone-salmeterol, tamsulosin, and metFORMIN.  No orders of the defined types were placed in this encounter.    Follow-up: No follow-ups on file.  [name], M.D.

## 2023-03-02 LAB — CBC WITH DIFFERENTIAL/PLATELET
Basophils Absolute: 0 10*3/uL (ref 0.0–0.2)
Basos: 0 %
EOS (ABSOLUTE): 0.2 10*3/uL (ref 0.0–0.4)
Eos: 4 %
Hematocrit: 39.5 % (ref 37.5–51.0)
Hemoglobin: 13.4 g/dL (ref 13.0–17.7)
Immature Grans (Abs): 0 10*3/uL (ref 0.0–0.1)
Immature Granulocytes: 0 %
Lymphocytes Absolute: 2.2 10*3/uL (ref 0.7–3.1)
Lymphs: 47 %
MCH: 31.8 pg (ref 26.6–33.0)
MCHC: 33.9 g/dL (ref 31.5–35.7)
MCV: 94 fL (ref 79–97)
Monocytes Absolute: 0.5 10*3/uL (ref 0.1–0.9)
Monocytes: 10 %
Neutrophils Absolute: 1.9 10*3/uL (ref 1.4–7.0)
Neutrophils: 39 %
Platelets: 187 10*3/uL (ref 150–450)
RBC: 4.22 x10E6/uL (ref 4.14–5.80)
RDW: 13 % (ref 11.6–15.4)
WBC: 4.7 10*3/uL (ref 3.4–10.8)

## 2023-03-02 LAB — LIPID PANEL
Chol/HDL Ratio: 2.9 ratio (ref 0.0–5.0)
Cholesterol, Total: 140 mg/dL (ref 100–199)
HDL: 49 mg/dL (ref >39)
LDL Chol Calc (NIH): 65 mg/dL (ref 0–99)
Triglycerides: 150 mg/dL — ABNORMAL HIGH (ref 0–149)
VLDL Cholesterol Cal: 26 mg/dL (ref 5–40)

## 2023-03-02 LAB — CMP14+EGFR
ALT: 39 IU/L (ref 0–44)
AST: 27 IU/L (ref 0–40)
Albumin/Globulin Ratio: 1.7 (ref 1.2–2.2)
Albumin: 4.3 g/dL (ref 3.9–4.9)
Alkaline Phosphatase: 50 IU/L (ref 44–121)
BUN/Creatinine Ratio: 18 (ref 10–24)
BUN: 22 mg/dL (ref 8–27)
Bilirubin Total: 0.3 mg/dL (ref 0.0–1.2)
CO2: 23 mmol/L (ref 20–29)
Calcium: 9.2 mg/dL (ref 8.6–10.2)
Chloride: 102 mmol/L (ref 96–106)
Creatinine, Ser: 1.25 mg/dL (ref 0.76–1.27)
Globulin, Total: 2.6 g/dL (ref 1.5–4.5)
Glucose: 119 mg/dL — ABNORMAL HIGH (ref 70–99)
Potassium: 4 mmol/L (ref 3.5–5.2)
Sodium: 144 mmol/L (ref 134–144)
Total Protein: 6.9 g/dL (ref 6.0–8.5)
eGFR: 62 mL/min/{1.73_m2} (ref >59)

## 2023-03-06 DIAGNOSIS — H5203 Hypermetropia, bilateral: Secondary | ICD-10-CM | POA: Diagnosis not present

## 2023-03-06 DIAGNOSIS — H524 Presbyopia: Secondary | ICD-10-CM | POA: Diagnosis not present

## 2023-03-06 DIAGNOSIS — H2513 Age-related nuclear cataract, bilateral: Secondary | ICD-10-CM | POA: Diagnosis not present

## 2023-03-06 DIAGNOSIS — E119 Type 2 diabetes mellitus without complications: Secondary | ICD-10-CM | POA: Diagnosis not present

## 2023-03-06 DIAGNOSIS — H52223 Regular astigmatism, bilateral: Secondary | ICD-10-CM | POA: Diagnosis not present

## 2023-03-06 LAB — HM DIABETES EYE EXAM

## 2023-03-06 NOTE — Progress Notes (Signed)
Hello Butler,  Your lab result is normal and/or stable.Some minor variations that are not significant are commonly marked abnormal, but do not represent any medical problem for you.  Best regards, Morton Simson, M.D.

## 2023-03-11 DIAGNOSIS — E1165 Type 2 diabetes mellitus with hyperglycemia: Secondary | ICD-10-CM | POA: Diagnosis not present

## 2023-03-19 ENCOUNTER — Other Ambulatory Visit: Payer: Self-pay | Admitting: Family Medicine

## 2023-03-19 DIAGNOSIS — R609 Edema, unspecified: Secondary | ICD-10-CM

## 2023-04-11 DIAGNOSIS — E1165 Type 2 diabetes mellitus with hyperglycemia: Secondary | ICD-10-CM | POA: Diagnosis not present

## 2023-04-18 ENCOUNTER — Other Ambulatory Visit: Payer: Self-pay | Admitting: Family Medicine

## 2023-04-27 ENCOUNTER — Telehealth: Payer: Self-pay | Admitting: Family Medicine

## 2023-04-27 NOTE — Telephone Encounter (Signed)
Juan Merritt  scheduled for  their annual wellness visit. Appointment made for 08/22/2023.  Thank you,  Judeth Cornfield,  AMB Clinical Support [facility] AWV Program Direct Dial ??[phone]

## 2023-05-12 ENCOUNTER — Other Ambulatory Visit: Payer: Self-pay | Admitting: Family Medicine

## 2023-05-17 ENCOUNTER — Telehealth (INDEPENDENT_AMBULATORY_CARE_PROVIDER_SITE_OTHER): Payer: Medicare HMO | Admitting: Family Medicine

## 2023-05-17 ENCOUNTER — Other Ambulatory Visit: Payer: Self-pay | Admitting: Family Medicine

## 2023-05-17 ENCOUNTER — Encounter: Payer: Self-pay | Admitting: Family Medicine

## 2023-05-17 DIAGNOSIS — U071 COVID-19: Secondary | ICD-10-CM | POA: Diagnosis not present

## 2023-05-17 MED ORDER — NIRMATRELVIR/RITONAVIR (PAXLOVID)TABLET: 3.0000 | ORAL_TABLET | Freq: Two times a day (BID) | ORAL | 0 refills | Status: AC

## 2023-05-17 NOTE — Progress Notes (Signed)
   Virtual Visit via video Note   Due to COVID-19 pandemic this visit was conducted virtually. This visit type was conducted due to national recommendations for restrictions regarding the COVID-19 Pandemic (e.g. social distancing, sheltering in place) in an effort to limit this patient's exposure and mitigate transmission in our community. All issues noted in this document were discussed and addressed.  A physical exam was not performed with this format.  I connected with  Ashley Polacek Hardiman  on 05/17/23 at 1330 by video and verified that I am speaking with the correct person using two identifiers. Subhash Upshur Younce is currently located at home and no one is currently with him during the visit. The provider, Gabriel Earing, FNP is located in their office at time of visit.  I discussed the limitations, risks, security and privacy concerns of performing an evaluation and management service by video  and the availability of in person appointments. I also discussed with the patient that there may be a patient responsible charge related to this service. The patient expressed understanding and agreed to proceed.  CC: Covid 19  History and Present Illness:  Cristin tested positive for Covid this morning with a home test. He reports symtpoms started 2 days ago. He reports nasal congestion, mild shortness of breath with activity and sneezing. Denies fever, chest pain, wheezing, cough, sore throat, chills, myalgias, nausea, vomiting, or diarrhea. His wife has had Covid. He has not tried any remedies. He has not been taking his statin for the last few weeks.     ROS As per HPI.     Observations/Objective: Alert and oriented. Respirations unlabored. No cyanosis. Non toxic appearing. Normal mood and behavior.    Assessment and Plan: Thielen was seen today for covid positive.  Diagnoses and all orders for this visit:  COVID-19 He has not been taking statin for last few weeks- continue to hold until  3 days after completing paxlovid and do not take colchinie with paxlovid. Discussed symptomatic care and return precautions. Discussed quarantine.  -     nirmatrelvir/ritonavir (PAXLOVID) 20 x 150 MG & 10 x 100MG  TABS; Take 3 tablets by mouth 2 (two) times daily for 5 days. (Take nirmatrelvir 150 mg two tablets twice daily for 5 days and ritonavir 100 mg one tablet twice daily for 5 days) Patient GFR is 62     Follow Up Instructions: As needed.   I discussed the assessment and treatment plan with the patient. The patient was provided an opportunity to ask questions and all were answered. The patient agreed with the plan and demonstrated an understanding of the instructions.   The patient was advised to call back or seek an in-person evaluation if the symptoms worsen or if the condition fails to improve as anticipated.  The above assessment and management plan was discussed with the patient. The patient verbalized understanding of and has agreed to the management plan. Patient is aware to call the clinic if symptoms persist or worsen. Patient is aware when to return to the clinic for a follow-up visit. Patient educated on when it is appropriate to go to the emergency department.   Time call ended: 1337  I provided 7 minutes of face-to-face time during this encounter.    Gabriel Earing, FNP

## 2023-06-07 ENCOUNTER — Ambulatory Visit (INDEPENDENT_AMBULATORY_CARE_PROVIDER_SITE_OTHER): Payer: Medicare HMO | Admitting: Family Medicine

## 2023-06-07 ENCOUNTER — Encounter: Payer: Self-pay | Admitting: Family Medicine

## 2023-06-07 VITALS — BP 108/67 | HR 76 | Temp 97.6°F | Ht 70.0 in | Wt 288.8 lb

## 2023-06-07 DIAGNOSIS — E1165 Type 2 diabetes mellitus with hyperglycemia: Secondary | ICD-10-CM | POA: Diagnosis not present

## 2023-06-07 DIAGNOSIS — I10 Essential (primary) hypertension: Secondary | ICD-10-CM | POA: Diagnosis not present

## 2023-06-07 DIAGNOSIS — E782 Mixed hyperlipidemia: Secondary | ICD-10-CM | POA: Diagnosis not present

## 2023-06-07 DIAGNOSIS — R609 Edema, unspecified: Secondary | ICD-10-CM

## 2023-06-07 DIAGNOSIS — Z794 Long term (current) use of insulin: Secondary | ICD-10-CM

## 2023-06-07 DIAGNOSIS — J449 Chronic obstructive pulmonary disease, unspecified: Secondary | ICD-10-CM

## 2023-06-07 LAB — LIPID PANEL
Chol/HDL Ratio: 3.8 ratio (ref 0.0–5.0)
Cholesterol, Total: 172 mg/dL (ref 100–199)
HDL: 45 mg/dL (ref >39)
LDL Chol Calc (NIH): 93 mg/dL (ref 0–99)
Triglycerides: 201 mg/dL — ABNORMAL HIGH (ref 0–149)
VLDL Cholesterol Cal: 34 mg/dL (ref 5–40)

## 2023-06-07 LAB — CMP14+EGFR
ALT: 61 IU/L — ABNORMAL HIGH (ref 0–44)
AST: 43 IU/L — ABNORMAL HIGH (ref 0–40)
Albumin: 4.2 g/dL (ref 3.9–4.9)
Alkaline Phosphatase: 54 IU/L (ref 44–121)
BUN/Creatinine Ratio: 20 (ref 10–24)
BUN: 21 mg/dL (ref 8–27)
Bilirubin Total: 0.5 mg/dL (ref 0.0–1.2)
CO2: 26 mmol/L (ref 20–29)
Calcium: 9.4 mg/dL (ref 8.6–10.2)
Chloride: 99 mmol/L (ref 96–106)
Creatinine, Ser: 1.06 mg/dL (ref 0.76–1.27)
Globulin, Total: 2.7 g/dL (ref 1.5–4.5)
Glucose: 113 mg/dL — ABNORMAL HIGH (ref 70–99)
Potassium: 3.7 mmol/L (ref 3.5–5.2)
Sodium: 138 mmol/L (ref 134–144)
Total Protein: 6.9 g/dL (ref 6.0–8.5)
eGFR: 76 mL/min/{1.73_m2} (ref >59)

## 2023-06-07 LAB — CBC WITH DIFFERENTIAL/PLATELET
Basophils Absolute: 0 10*3/uL (ref 0.0–0.2)
Basos: 0 %
EOS (ABSOLUTE): 0.1 10*3/uL (ref 0.0–0.4)
Eos: 2 %
Hematocrit: 41.6 % (ref 37.5–51.0)
Hemoglobin: 13.7 g/dL (ref 13.0–17.7)
Immature Grans (Abs): 0 10*3/uL (ref 0.0–0.1)
Immature Granulocytes: 0 %
Lymphocytes Absolute: 2.2 10*3/uL (ref 0.7–3.1)
Lymphs: 44 %
MCH: 31.4 pg (ref 26.6–33.0)
MCHC: 32.9 g/dL (ref 31.5–35.7)
MCV: 95 fL (ref 79–97)
Monocytes Absolute: 0.6 10*3/uL (ref 0.1–0.9)
Monocytes: 11 %
Neutrophils Absolute: 2.2 10*3/uL (ref 1.4–7.0)
Neutrophils: 43 %
Platelets: 178 10*3/uL (ref 150–450)
RBC: 4.36 x10E6/uL (ref 4.14–5.80)
RDW: 13 % (ref 11.6–15.4)
WBC: 5.2 10*3/uL (ref 3.4–10.8)

## 2023-06-07 LAB — BAYER DCA HB A1C WAIVED: HB A1C (BAYER DCA - WAIVED): 6 % — ABNORMAL HIGH (ref 4.8–5.6)

## 2023-06-07 MED ORDER — ALBUTEROL SULFATE HFA 108 (90 BASE) MCG/ACT IN AERS: 2.0000 | INHALATION_SPRAY | Freq: Two times a day (BID) | RESPIRATORY_TRACT | 5 refills | Status: DC

## 2023-06-07 MED ORDER — TORSEMIDE 100 MG PO TABS: 100.0000 mg | ORAL_TABLET | Freq: Two times a day (BID) | ORAL | 2 refills | Status: DC

## 2023-06-07 MED ORDER — TAMSULOSIN HCL 0.4 MG PO CAPS: ORAL_CAPSULE | ORAL | 2 refills | Status: DC

## 2023-06-07 MED ORDER — MELOXICAM 15 MG PO TABS: 15.0000 mg | ORAL_TABLET | Freq: Every day | ORAL | 3 refills | Status: DC

## 2023-06-07 NOTE — Progress Notes (Signed)
Subjective:  Patient ID: Juan Merritt,  male    DOB: 1951/02/07  Age: 72 y.o.    CC: Medical Management of Chronic Issues   HPI Juan Merritt Date presents for  follow-up of hypertension. Patient has no history of headache chest pain or shortness of breath or recent cough. Patient also denies symptoms of TIA such as numbness weakness lateralizing. Patient denies side effects from medication. States taking it regularly.  Patient also  in for follow-up of elevated cholesterol. Doing well without complaints on current medication. Denies side effects  including myalgia and arthralgia and nausea. Also in today for liver function testing. Currently no chest pain, shortness of breath or other cardiovascular related symptoms noted.  Follow-up of diabetes. Patient does check blood sugar TID at home. Readings run 116 on average. Patient denies symptoms such as excessive hunger or urinary frequency, excessive hunger, nausea No significant hypoglycemic spells noted. Medications reviewed. Pt reports taking them regularly. Pt. denies complication/adverse reaction today.    History Juan Merritt has a past medical history of Arthritis, BPH associated with nocturia, Chronic gout, COPD (chronic obstructive pulmonary disease) (HCC), History of colon polyps, History of COVID-19, Hyperlipidemia, mixed, Hypertension, Phimosis, and Type 2 diabetes mellitus (HCC).   Juan Merritt has a past surgical history that includes Laparoscopic cholecystectomy (2008); Tonsillectomy; Colonoscopy (12/01/2021); and Circumcision (N/A, 05/19/2022).   His family history includes Asthma in his mother; Diabetes in his father; Esophageal cancer in his paternal uncle; Heart attack in an other family member; Heart attack (age of onset: 30) in his father; Leukemia in his brother; Lung disease in an other family member.Juan Merritt reports that Juan Merritt quit smoking about 6 years ago. His smoking use included cigarettes and e-cigarettes. Juan Merritt has a 10.00 pack-year  smoking history. His smokeless tobacco use includes chew. Juan Merritt reports current alcohol use. Juan Merritt reports that Juan Merritt does not use drugs.  Current Outpatient Medications on File Prior to Visit  Medication Sig Dispense Refill   allopurinol (ZYLOPRIM) 100 MG tablet TAKE 1 TABLET (100 MG TOTAL) BY MOUTH DAILY. TO PREVENT GOUT 90 tablet 3   amLODipine (NORVASC) 5 MG tablet Take 1 tablet (5 mg total) by mouth daily. 90 tablet 3   aspirin EC 81 MG tablet Take 81 mg by mouth daily. Swallow whole.     clotrimazole-betamethasone (LOTRISONE) cream Apply 1 application topically 2 (two) times daily. 30 g 0   colchicine 0.6 MG tablet TAKE TWICE DAILY FOR GOUT ATTACK. (MAY TAKE EVERY TWO HOURS UP TO 6 DOSES AT ACUTE ONSET) 180 tablet 0   Continuous Blood Gluc Sensor (FREESTYLE LIBRE 2 SENSOR) MISC USE TO TEST BLOOD SUGAR CONTINUOUSLY. Dx. E11.65 SEND TO [facility] FOR MEDICARE COVERAGE, DO NOT SEND TO LOCAL PHARMACY 2 each 0   fluticasone-salmeterol (ADVAIR) 500-50 MCG/ACT AEPB Inhale 1 puff into the lungs in the morning and at bedtime. 180 each 3   Insulin Pen Needle 31G X 8 MM MISC Use to inject insulin daily. DX: E11.59 100 each 12   ipratropium-albuterol (DUONEB) 0.5-2.5 (3) MG/3ML SOLN Take 3 mLs by nebulization every 4 (four) hours as needed. (Patient taking differently: Take 3 mLs by nebulization every 4 (four) hours as needed (SOB).) 360 mL 3   losartan (COZAAR) 100 MG tablet Take 1 tablet (100 mg total) by mouth daily. 90 tablet 3   metFORMIN (GLUCOPHAGE-XR) 750 MG 24 hr tablet TAKE 1 TABLET BY MOUTH EVERY DAY WITH BREAKFAST 90 tablet 1   potassium chloride SA (KLOR-CON M20) 20 MEQ tablet  TAKE 1 TABLET (20 MEQ TOTAL) DAILY BY MOUTH. 90 tablet 3   rosuvastatin (CRESTOR) 5 MG tablet Take 1 tablet (5 mg total) by mouth daily. For cholesterol 90 tablet 3   No current facility-administered medications on file prior to visit.    ROS Review of Systems  Constitutional:  Negative for fever.  Respiratory:   Negative for shortness of breath.   Cardiovascular:  Negative for chest pain.  Musculoskeletal:  Negative for arthralgias.  Skin:  Negative for rash.    Objective:  BP 108/67   Pulse 76   Temp 97.6 F (36.4 C)   Ht 5\' 10"  (1.778 m)   Wt 288 lb 12.8 oz (131 kg)   SpO2 96%   BMI 41.44 kg/m   BP Readings from Last 3 Encounters:  06/07/23 108/67  03/01/23 115/68  01/18/23 111/66    Wt Readings from Last 3 Encounters:  06/07/23 288 lb 12.8 oz (131 kg)  03/01/23 295 lb (133.8 kg)  01/18/23 298 lb 9.6 oz (135.4 kg)     Physical Exam Vitals reviewed.  Constitutional:      Appearance: Juan Merritt is well-developed.  HENT:     Head: Normocephalic and atraumatic.     Right Ear: External ear normal.     Left Ear: External ear normal.     Mouth/Throat:     Pharynx: No oropharyngeal exudate or posterior oropharyngeal erythema.  Eyes:     Pupils: Pupils are equal, round, and reactive to light.  Cardiovascular:     Rate and Rhythm: Normal rate and regular rhythm.     Heart sounds: No murmur heard. Pulmonary:     Effort: No respiratory distress.     Breath sounds: Normal breath sounds.  Musculoskeletal:     Cervical back: Normal range of motion and neck supple.  Neurological:     Mental Status: Juan Merritt is alert and oriented to person, place, and time.     Diabetic Foot Exam - Simple   Simple Foot Form Diabetic Foot exam was performed with the following findings: Yes 06/07/2023  8:29 AM  Visual Inspection No deformities, no ulcerations, no other skin breakdown bilaterally: Yes Sensation Testing Intact to touch and monofilament testing bilaterally: Yes Pulse Check Posterior Tibialis and Dorsalis pulse intact bilaterally: Yes Comments 1st toes onychomycosis noted     Lab Results  Component Value Date   HGBA1C 6.2 (H) 03/01/2023   HGBA1C 6.4 (H) 11/30/2022   HGBA1C 5.9 (H) 08/30/2022    Assessment & Plan:   Juan Merritt was seen today for medical management of chronic  issues.  Diagnoses and all orders for this visit:  Type 2 diabetes mellitus with hyperglycemia, with long-term current use of insulin (HCC) -     Bayer DCA Hb A1c Waived  Essential hypertension, benign -     CBC with Differential/Platelet -     CMP14+EGFR  Mixed hyperlipidemia -     Lipid panel  Edema, unspecified type -     torsemide (DEMADEX) 100 MG tablet; Take 1 tablet (100 mg total) by mouth 2 (two) times daily. At arising and at lunch  COPD mixed type (HCC) -     albuterol (VENTOLIN HFA) 108 (90 Base) MCG/ACT inhaler; Inhale 2 puffs into the lungs 2 (two) times daily.  Other orders -     tamsulosin (FLOMAX) 0.4 MG CAPS capsule; TAKE 2 CAPSULES BY MOUTH AT BEDTIME FOR URINE FLOW AND PROSTATE -     meloxicam (MOBIC) 15 MG tablet; Take 1  tablet (15 mg total) by mouth daily.   I am having Juan Merritt. Juan Merritt maintain his ipratropium-albuterol, aspirin EC, Insulin Pen Needle, FreeStyle Libre 2 Sensor, clotrimazole-betamethasone, allopurinol, losartan, potassium chloride SA, rosuvastatin, amLODipine, fluticasone-salmeterol, colchicine, metFORMIN, tamsulosin, torsemide, meloxicam, and albuterol.  Meds ordered this encounter  Medications   tamsulosin (FLOMAX) 0.4 MG CAPS capsule    Sig: TAKE 2 CAPSULES BY MOUTH AT BEDTIME FOR URINE FLOW AND PROSTATE    Dispense:  180 capsule    Refill:  2   torsemide (DEMADEX) 100 MG tablet    Sig: Take 1 tablet (100 mg total) by mouth 2 (two) times daily. At arising and at lunch    Dispense:  180 tablet    Refill:  2   meloxicam (MOBIC) 15 MG tablet    Sig: Take 1 tablet (15 mg total) by mouth daily.    Dispense:  90 tablet    Refill:  3   albuterol (VENTOLIN HFA) 108 (90 Base) MCG/ACT inhaler    Sig: Inhale 2 puffs into the lungs 2 (two) times daily.    Dispense:  18 g    Refill:  5     Follow-up: Return in about 3 months (around 09/07/2023).  Mechele Claude, M.D.

## 2023-06-08 LAB — MICROALBUMIN / CREATININE URINE RATIO
Creatinine, Urine: 125.9 mg/dL
Microalb/Creat Ratio: 9 mg/g creat (ref 0–29)
Microalbumin, Urine: 11.2 ug/mL

## 2023-06-08 NOTE — Progress Notes (Signed)
Hello Juan Merritt,  Your lab result is normal and/or stable.Some minor variations that are not significant are commonly marked abnormal, but do not represent any medical problem for you.  Best regards, Hollye Pritt, M.D.

## 2023-06-11 DIAGNOSIS — E1165 Type 2 diabetes mellitus with hyperglycemia: Secondary | ICD-10-CM | POA: Diagnosis not present

## 2023-07-10 ENCOUNTER — Other Ambulatory Visit: Payer: Self-pay | Admitting: Family Medicine

## 2023-07-11 DIAGNOSIS — E1165 Type 2 diabetes mellitus with hyperglycemia: Secondary | ICD-10-CM | POA: Diagnosis not present

## 2023-07-15 ENCOUNTER — Other Ambulatory Visit: Payer: Self-pay | Admitting: Family Medicine

## 2023-07-15 DIAGNOSIS — I10 Essential (primary) hypertension: Secondary | ICD-10-CM

## 2023-07-15 DIAGNOSIS — E1159 Type 2 diabetes mellitus with other circulatory complications: Secondary | ICD-10-CM

## 2023-08-01 ENCOUNTER — Encounter: Payer: Self-pay | Admitting: *Deleted

## 2023-08-07 ENCOUNTER — Other Ambulatory Visit: Payer: Self-pay | Admitting: Family Medicine

## 2023-08-15 DIAGNOSIS — E1165 Type 2 diabetes mellitus with hyperglycemia: Secondary | ICD-10-CM | POA: Diagnosis not present

## 2023-08-16 ENCOUNTER — Other Ambulatory Visit: Payer: Self-pay | Admitting: Family Medicine

## 2023-08-16 DIAGNOSIS — E790 Hyperuricemia without signs of inflammatory arthritis and tophaceous disease: Secondary | ICD-10-CM

## 2023-08-17 DIAGNOSIS — E785 Hyperlipidemia, unspecified: Secondary | ICD-10-CM | POA: Diagnosis not present

## 2023-08-17 DIAGNOSIS — F17210 Nicotine dependence, cigarettes, uncomplicated: Secondary | ICD-10-CM | POA: Diagnosis not present

## 2023-08-17 DIAGNOSIS — J449 Chronic obstructive pulmonary disease, unspecified: Secondary | ICD-10-CM | POA: Diagnosis not present

## 2023-08-17 DIAGNOSIS — E119 Type 2 diabetes mellitus without complications: Secondary | ICD-10-CM | POA: Diagnosis not present

## 2023-08-17 DIAGNOSIS — Z791 Long term (current) use of non-steroidal anti-inflammatories (NSAID): Secondary | ICD-10-CM | POA: Diagnosis not present

## 2023-08-17 DIAGNOSIS — N400 Enlarged prostate without lower urinary tract symptoms: Secondary | ICD-10-CM | POA: Diagnosis not present

## 2023-08-17 DIAGNOSIS — M109 Gout, unspecified: Secondary | ICD-10-CM | POA: Diagnosis not present

## 2023-08-17 DIAGNOSIS — M1990 Unspecified osteoarthritis, unspecified site: Secondary | ICD-10-CM | POA: Diagnosis not present

## 2023-08-17 DIAGNOSIS — Z7984 Long term (current) use of oral hypoglycemic drugs: Secondary | ICD-10-CM | POA: Diagnosis not present

## 2023-08-17 DIAGNOSIS — Z7951 Long term (current) use of inhaled steroids: Secondary | ICD-10-CM | POA: Diagnosis not present

## 2023-08-17 DIAGNOSIS — I10 Essential (primary) hypertension: Secondary | ICD-10-CM | POA: Diagnosis not present

## 2023-08-17 DIAGNOSIS — E876 Hypokalemia: Secondary | ICD-10-CM | POA: Diagnosis not present

## 2023-08-22 ENCOUNTER — Ambulatory Visit (INDEPENDENT_AMBULATORY_CARE_PROVIDER_SITE_OTHER): Payer: Medicare HMO

## 2023-08-22 VITALS — Ht 70.0 in | Wt 290.0 lb

## 2023-08-22 DIAGNOSIS — Z0000 Encounter for general adult medical examination without abnormal findings: Secondary | ICD-10-CM

## 2023-08-22 DIAGNOSIS — Z122 Encounter for screening for malignant neoplasm of respiratory organs: Secondary | ICD-10-CM

## 2023-08-22 NOTE — Progress Notes (Signed)
Subjective:   Juan Merritt is a 72 y.o. male who presents for Medicare Annual/Subsequent preventive examination.  Visit Complete: Virtual  I connected with  Juan Merritt on 08/22/23 by a audio enabled telemedicine application and verified that I am speaking with the correct person using two identifiers.  Patient Location: Home  Provider Location: Home Office  I discussed the limitations of evaluation and management by telemedicine. The patient expressed understanding and agreed to proceed.  Patient Medicare AWV questionnaire was completed by the patient on 08/22/2023; I have confirmed that all information answered by patient is correct and no changes since this date.  Review of Systems    Vital Signs: Unable to obtain new vitals due to this being a telehealth visit.  Cardiac Risk Factors include: advanced age (>45men, >18 women);diabetes mellitus;dyslipidemia;male gender;hypertension Nutrition Risk Assessment:  Has the patient had any N/V/D within the last 2 months?  No  Does the patient have any non-healing wounds?  No  Has the patient had any unintentional weight loss or weight gain?  No   Diabetes:  Is the patient diabetic?  Yes  If diabetic, was a CBG obtained today?  No  Did the patient bring in their glucometer from home?  No  How often do you monitor your CBG's? Daily .   Financial Strains and Diabetes Management:  Are you having any financial strains with the device, your supplies or your medication? No .  Does the patient want to be seen by Chronic Care Management for management of their diabetes?  No  Would the patient like to be referred to a Nutritionist or for Diabetic Management?  No   Diabetic Exams:  Diabetic Eye Exam: Completed 04/2023 Diabetic Foot Exam: Overdue, Pt has been advised about the importance in completing this exam. Pt is scheduled for diabetic foot exam on next office visit .     Objective:    Today's Vitals   08/22/23 0911   Weight: 290 lb (131.5 kg)  Height: 5\' 10"  (1.778 m)   Body mass index is 41.61 kg/m.     08/22/2023    9:14 AM 08/19/2022    9:13 AM 05/19/2022   10:54 AM 08/18/2021    9:03 AM 05/13/2021    1:46 PM 08/17/2020    8:46 AM 11/27/2019   10:16 AM  Advanced Directives  Does Patient Have a Medical Advance Directive? Yes Yes Yes Yes No Yes Yes  Type of Estate agent of [place];Living will Healthcare Power of [place];Living will Healthcare Power of [place];Living will Healthcare Power of [place];Living will  Living will Healthcare Power of Attorney  Does patient want to make changes to medical advance directive?      No - Patient declined No - Patient declined  Copy of Healthcare Power of Attorney in Chart? No - copy requested No - copy requested No - copy requested No - copy requested   No - copy requested  Would patient like information on creating a medical advance directive?     No - Patient declined      Current Medications (verified) Outpatient Encounter Medications as of 08/22/2023  Medication Sig   albuterol (VENTOLIN HFA) 108 (90 Base) MCG/ACT inhaler Inhale 2 puffs into the lungs 2 (two) times daily.   allopurinol (ZYLOPRIM) 100 MG tablet TAKE 1 TABLET (100 MG TOTAL) BY MOUTH DAILY. TO PREVENT GOUT   amLODipine (NORVASC) 5 MG tablet Take 1 tablet (5 mg total) by mouth daily.   aspirin  EC 81 MG tablet Take 81 mg by mouth daily. Swallow whole.   clotrimazole-betamethasone (LOTRISONE) cream Apply 1 application topically 2 (two) times daily.   colchicine 0.6 MG tablet TAKE TWICE DAILY FOR GOUT ATTACK. (MAY TAKE EVERY TWO HOURS UP TO 6 DOSES AT ACUTE ONSET)   Continuous Blood Gluc Sensor (FREESTYLE LIBRE 2 SENSOR) MISC USE TO TEST BLOOD SUGAR CONTINUOUSLY. Dx. E11.65 SEND TO [facility] FOR MEDICARE COVERAGE, DO NOT SEND TO LOCAL PHARMACY   fluticasone-salmeterol (ADVAIR) 500-50 MCG/ACT AEPB Inhale 1 puff into the lungs in the morning and at bedtime.   Insulin Pen  Needle 31G X 8 MM MISC Use to inject insulin daily. DX: E11.59   ipratropium-albuterol (DUONEB) 0.5-2.5 (3) MG/3ML SOLN Take 3 mLs by nebulization every 4 (four) hours as needed. (Patient taking differently: Take 3 mLs by nebulization every 4 (four) hours as needed (SOB).)   losartan (COZAAR) 100 MG tablet TAKE 1 TABLET BY MOUTH EVERY DAY   meloxicam (MOBIC) 15 MG tablet Take 1 tablet (15 mg total) by mouth daily.   metFORMIN (GLUCOPHAGE-XR) 750 MG 24 hr tablet TAKE 1 TABLET BY MOUTH EVERY DAY WITH BREAKFAST   potassium chloride SA (KLOR-CON M20) 20 MEQ tablet TAKE 1 TABLET (20 MEQ TOTAL) DAILY BY MOUTH.   rosuvastatin (CRESTOR) 5 MG tablet Take 1 tablet (5 mg total) by mouth daily. For cholesterol   tamsulosin (FLOMAX) 0.4 MG CAPS capsule TAKE 2 CAPSULES BY MOUTH AT BEDTIME FOR URINE FLOW AND PROSTATE   torsemide (DEMADEX) 100 MG tablet Take 1 tablet (100 mg total) by mouth 2 (two) times daily. At arising and at lunch   No facility-administered encounter medications on file as of 08/22/2023.    Allergies (verified) Patient has no known allergies.   History: Past Medical History:  Diagnosis Date   Arthritis    BPH associated with nocturia    Chronic gout    COPD (chronic obstructive pulmonary disease) (HCC)    not on home o2   History of colon polyps    History of COVID-19    04/ 2019 hospital admission in epic, severe sepsis due to covid w/ acute respiratory failure;   and 05/ 2021 mild to moderate symptoms, recovered at home   Hyperlipidemia, mixed    Hypertension    Phimosis    Type 2 diabetes mellitus (HCC)    followed by pcp   (05-17-2022  pt stated checks sugar multiple times daily w/ Libre,  fasting average --- 100-110)   Past Surgical History:  Procedure Laterality Date   CIRCUMCISION N/A 05/19/2022   Procedure: CIRCUMCISION ADULT;  Surgeon: Marcine Matar, MD;  Location: [facility];  Service: Urology;  Laterality: N/A;  45 MINUTES   COLONOSCOPY   12/01/2021   by dr Myrtie Neither   LAPAROSCOPIC CHOLECYSTECTOMY  2008   in [place]   TONSILLECTOMY     child   Family History  Problem Relation Age of Onset   Asthma Mother    Diabetes Father    Heart attack Father 58   Leukemia Brother    Esophageal cancer Paternal Uncle    Lung disease Other        pat cousin   Heart attack Other        pat cousin   Colon cancer Neg Hx    Stomach cancer Neg Hx    Social History   Socioeconomic History   Marital status: Married    Spouse name: Nelva Bush   Number of children:  5   Years of education: 10   Highest education level: 10th grade  Occupational History   Occupation: retired Naval architect  Tobacco Use   Smoking status: Former    Current packs/day: 0.00    Average packs/day: 0.3 packs/day for 40.0 years (10.0 ttl pk-yrs)    Types: Cigarettes, E-cigarettes    Start date: 01/15/1977    Quit date: 01/15/2017    Years since quitting: 6.6   Smokeless tobacco: Current    Types: Chew   Tobacco comments:    Smoked 1-2 packs per day for about 45 years - now e-cigarette sometimes  Vaping Use   Vaping status: Every Day   Substances: Nicotine   Devices: unsure  Substance and Sexual Activity   Alcohol use: Yes    Comment: occasional   Drug use: Never   Sexual activity: Not Currently  Other Topics Concern   Not on file  Social History Narrative   Lives at home with wife.  Grown children.  Retired IT trainer. Children live nearby.    Social Determinants of Health   Financial Resource Strain: Low Risk  (08/22/2023)   Overall Financial Resource Strain (CARDIA)    Difficulty of Paying Living Expenses: Not hard at all  Food Insecurity: No Food Insecurity (08/22/2023)   Hunger Vital Sign    Worried About Running Out of Food in the Last Year: Never true    Ran Out of Food in the Last Year: Never true  Transportation Needs: No Transportation Needs (08/22/2023)   PRAPARE - Administrator, Civil Service (Medical): No    Lack of Transportation  (Non-Medical): No  Physical Activity: Insufficiently Active (08/22/2023)   Exercise Vital Sign    Days of Exercise per Week: 3 days    Minutes of Exercise per Session: 30 min  Stress: No Stress Concern Present (08/22/2023)   [employer] of Occupational Health - Occupational Stress Questionnaire    Feeling of Stress : Not at all  Social Connections: Moderately Isolated (08/22/2023)   Social Connection and Isolation Panel [NHANES]    Frequency of Communication with Friends and Family: More than three times a week    Frequency of Social Gatherings with Friends and Family: More than three times a week    Attends Religious Services: Never    Database administrator or Organizations: No    Attends Engineer, structural: Never    Marital Status: Married    Tobacco Counseling Ready to quit: No Counseling given: Not Answered Tobacco comments: Smoked 1-2 packs per day for about 45 years - now e-cigarette sometimes   Clinical Intake:  Pre-visit preparation completed: Yes  Pain : No/denies pain     Nutritional Risks: None Diabetes: Yes CBG done?: No Did pt. bring in CBG monitor from home?: No  How often do you need to have someone help you when you read instructions, pamphlets, or other written materials from your doctor or pharmacy?: 1 - Never  Interpreter Needed?: No  Information entered by :: Renie Ora, LPN   Activities of Daily Living    08/22/2023    9:15 AM  In your present state of health, do you have any difficulty performing the following activities:  Hearing? 0  Vision? 0  Difficulty concentrating or making decisions? 0  Walking or climbing stairs? 0  Dressing or bathing? 0  Doing errands, shopping? 0  Preparing Food and eating ? N  Using the Toilet? N  In the past six months,  have you accidently leaked urine? N  Do you have problems with loss of bowel control? N  Managing your Medications? N  Managing your Finances? N  Housekeeping or managing your  Housekeeping? N    Patient Care Team: Mechele Claude, MD as PCP - General (Family Medicine) Danella Maiers, [facility] as Pharmacist (Family Medicine) Delora Fuel, OD (Optometry)  Indicate any recent Medical Services you may have received from other than [facility] providers in the past year (date may be approximate).     Assessment:   This is a routine wellness examination for Martinez.  Hearing/Vision screen Vision Screening - Comments:: Wears rx glasses - up to date with routine eye exams with  Dr.johnson   Dietary issues and exercise activities discussed:     Goals Addressed             This Visit's Progress    Exercise 3x per week (30 min per time)         Depression Screen    08/22/2023    9:13 AM 06/07/2023    7:49 AM 03/01/2023    7:47 AM 01/18/2023    8:07 AM 11/30/2022    8:02 AM 08/30/2022    7:55 AM 08/19/2022   11:03 AM  PHQ 2/9 Scores  PHQ - 2 Score 0 0 0 0 0 0 0  PHQ- 9 Score       0    Fall Risk    08/22/2023    9:12 AM 06/07/2023    7:49 AM 03/01/2023    7:47 AM 01/18/2023    8:06 AM 11/30/2022    8:02 AM  Fall Risk   Falls in the past year? 0 0 0 0 0  Number falls in past yr: 0      Injury with Fall? 0      Risk for fall due to : No Fall Risks      Follow up Falls prevention discussed        MEDICARE RISK AT HOME: Medicare Risk at Home Any stairs in or around the home?: No If so, are there any without handrails?: No Home free of loose throw rugs in walkways, pet beds, electrical cords, etc?: Yes Adequate lighting in your home to reduce risk of falls?: Yes Life alert?: No Use of a cane, walker or w/c?: No Grab bars in the bathroom?: Yes Shower chair or bench in shower?: Yes Elevated toilet seat or a handicapped toilet?: Yes  TIMED UP AND GO:  Was the test performed?  No    Cognitive Function:        08/22/2023    9:15 AM 08/19/2022    9:14 AM 08/17/2020    8:41 AM 08/14/2019    8:45 AM  6CIT Screen  What Year? 0 points 0 points 0 points 0  points  What month? 0 points 0 points 0 points 0 points  What time? 0 points 0 points 0 points 0 points  Count back from 20 0 points 0 points 0 points 0 points  Months in reverse 0 points 0 points 0 points 2 points  Repeat phrase 0 points 2 points 0 points 2 points  Total Score 0 points 2 points 0 points 4 points    Immunizations Immunization History  Administered Date(s) Administered   Fluad Quad(high Dose 65+) 09/23/2019, 11/20/2020, 09/28/2021, 12/01/2022   Influenza,inj,Quad PF,6+ Mos 10/14/2016   Moderna SARS-COV2 Booster Vaccination 10/14/2020, 12/08/2021   Moderna Sars-Covid-2 Vaccination 02/26/2020, 03/25/2020   PFIZER(Purple Top)SARS-COV-2  Vaccination 06/04/2021   PNEUMOCOCCAL CONJUGATE-20 09/28/2021   Pneumococcal Polysaccharide-23 09/07/2015    TDAP status: Due, Education has been provided regarding the importance of this vaccine. Advised may receive this vaccine at local pharmacy or Health Dept. Aware to provide a copy of the vaccination record if obtained from local pharmacy or Health Dept. Verbalized acceptance and understanding.  Flu Vaccine status: Up to date  Pneumococcal vaccine status: Up to date  Covid-19 vaccine status: Completed vaccines  Qualifies for Shingles Vaccine? Yes   Zostavax completed No   Shingrix Completed?: Yes  Screening Tests Health Maintenance  Topic Date Due   DTaP/Tdap/Td (1 - Tdap) Never done   Lung Cancer Screening  04/30/2016   INFLUENZA VACCINE  07/20/2023   COVID-19 Vaccine (6 - 2023-24 season) 08/20/2023   Zoster Vaccines- Shingrix (1 of 2) 09/07/2023 (Originally 07/21/2003)   HEMOGLOBIN A1C  12/07/2023   OPHTHALMOLOGY EXAM  03/05/2024   Diabetic kidney evaluation - eGFR measurement  06/06/2024   Diabetic kidney evaluation - Urine ACR  06/06/2024   FOOT EXAM  06/06/2024   Medicare Annual Wellness (AWV)  08/21/2024   Colonoscopy  12/01/2024   Pneumonia Vaccine 5+ Years old  Completed   Hepatitis C Screening  Completed   HPV  VACCINES  Aged Out   Fecal DNA (Cologuard)  Discontinued    Health Maintenance  Health Maintenance Due  Topic Date Due   DTaP/Tdap/Td (1 - Tdap) Never done   Lung Cancer Screening  04/30/2016   INFLUENZA VACCINE  07/20/2023   COVID-19 Vaccine (6 - 2023-24 season) 08/20/2023    Colorectal cancer screening: Type of screening: Colonoscopy. Completed 12/01/2021. Repeat every 5 years  Lung Cancer Screening: (Low Dose CT Chest recommended if Age 45-80 years, 20 pack-year currently smoking OR have quit w/in 15years.) does qualify.   Lung Cancer Screening Referral: 08/22/2023  Additional Screening:  Hepatitis C Screening: does not qualify; Completed 05/13/2021  Vision Screening: Recommended annual ophthalmology exams for early detection of glaucoma and other disorders of the eye. Is the patient up to date with their annual eye exam?  Yes  Who is the provider or what is the name of the office in which the patient attends annual eye exams? Dr.Johnson  If pt is not established with a provider, would they like to be referred to a provider to establish care? No .   Dental Screening: Recommended annual dental exams for proper oral hygiene  Community Resource Referral / Chronic Care Management: CRR required this visit?  No   CCM required this visit?  No     Plan:     I have personally reviewed and noted the following in the patient's chart:   Medical and social history Use of alcohol, tobacco or illicit drugs  Current medications and supplements including opioid prescriptions. Patient is not currently taking opioid prescriptions. Functional ability and status Nutritional status Physical activity Advanced directives List of other physicians Hospitalizations, surgeries, and ER visits in previous 12 months Vitals Screenings to include cognitive, depression, and falls Referrals and appointments  In addition, I have reviewed and discussed with patient certain preventive protocols,  quality metrics, and best practice recommendations. A written personalized care plan for preventive services as well as general preventive health recommendations were provided to patient.     Lorrene Reid, LPN   12/24/1094   After Visit Summary: (MyChart) Due to this being a telephonic visit, the after visit summary with patients personalized plan was offered to patient via MyChart  Nurse Notes: none

## 2023-08-22 NOTE — Patient Instructions (Signed)
Mr. Juan Merritt , Thank you for taking time to come for your Medicare Wellness Visit. I appreciate your ongoing commitment to your health goals. Please review the following plan we discussed and let me know if I can assist you in the future.   Referrals/Orders/Follow-Ups/Clinician Recommendations: Aim for 30 minutes of exercise or brisk walking, 6-8 glasses of water, and 5 servings of fruits and vegetables each day.   This is a list of the screening recommended for you and due dates:  Health Maintenance  Topic Date Due   DTaP/Tdap/Td vaccine (1 - Tdap) Never done   Screening for Lung Cancer  04/30/2016   Flu Shot  07/20/2023   COVID-19 Vaccine (6 - 2023-24 season) 08/20/2023   Zoster (Shingles) Vaccine (1 of 2) 09/07/2023*   Hemoglobin A1C  12/07/2023   Eye exam for diabetics  03/05/2024   Yearly kidney function blood test for diabetes  06/06/2024   Yearly kidney health urinalysis for diabetes  06/06/2024   Complete foot exam   06/06/2024   Medicare Annual Wellness Visit  08/21/2024   Colon Cancer Screening  12/01/2024   Pneumonia Vaccine  Completed   Hepatitis C Screening  Completed   HPV Vaccine  Aged Out   Cologuard (Stool DNA test)  Discontinued  *Topic was postponed. The date shown is not the original due date.    Advanced directives: (Copy Requested) Please bring a copy of your health care power of attorney and living will to the office to be added to your chart at your convenience.  Next Medicare Annual Wellness Visit scheduled for next year: Yes  insert Preventive Care attachment Insert FALL PREVENTION attachment if needed

## 2023-09-11 ENCOUNTER — Ambulatory Visit: Payer: Medicare HMO | Admitting: Family Medicine

## 2023-09-14 DIAGNOSIS — E1165 Type 2 diabetes mellitus with hyperglycemia: Secondary | ICD-10-CM | POA: Diagnosis not present

## 2023-09-18 ENCOUNTER — Other Ambulatory Visit: Payer: Self-pay | Admitting: Family Medicine

## 2023-09-20 ENCOUNTER — Ambulatory Visit: Payer: Medicare HMO | Admitting: Family Medicine

## 2023-09-20 ENCOUNTER — Encounter: Payer: Self-pay | Admitting: Family Medicine

## 2023-09-20 VITALS — BP 109/66 | HR 81 | Temp 97.5°F | Ht 70.0 in | Wt 283.2 lb

## 2023-09-20 DIAGNOSIS — T466X5A Adverse effect of antihyperlipidemic and antiarteriosclerotic drugs, initial encounter: Secondary | ICD-10-CM | POA: Diagnosis not present

## 2023-09-20 DIAGNOSIS — J449 Chronic obstructive pulmonary disease, unspecified: Secondary | ICD-10-CM

## 2023-09-20 DIAGNOSIS — E782 Mixed hyperlipidemia: Secondary | ICD-10-CM | POA: Diagnosis not present

## 2023-09-20 DIAGNOSIS — I10 Essential (primary) hypertension: Secondary | ICD-10-CM | POA: Diagnosis not present

## 2023-09-20 DIAGNOSIS — E1159 Type 2 diabetes mellitus with other circulatory complications: Secondary | ICD-10-CM

## 2023-09-20 DIAGNOSIS — G720 Drug-induced myopathy: Secondary | ICD-10-CM | POA: Diagnosis not present

## 2023-09-20 DIAGNOSIS — Z23 Encounter for immunization: Secondary | ICD-10-CM | POA: Diagnosis not present

## 2023-09-20 DIAGNOSIS — Z794 Long term (current) use of insulin: Secondary | ICD-10-CM

## 2023-09-20 DIAGNOSIS — I152 Hypertension secondary to endocrine disorders: Secondary | ICD-10-CM | POA: Diagnosis not present

## 2023-09-20 DIAGNOSIS — E1165 Type 2 diabetes mellitus with hyperglycemia: Secondary | ICD-10-CM | POA: Diagnosis not present

## 2023-09-20 DIAGNOSIS — E790 Hyperuricemia without signs of inflammatory arthritis and tophaceous disease: Secondary | ICD-10-CM | POA: Diagnosis not present

## 2023-09-20 LAB — BAYER DCA HB A1C WAIVED: HB A1C (BAYER DCA - WAIVED): 5.8 % — ABNORMAL HIGH (ref 4.8–5.6)

## 2023-09-20 MED ORDER — ALLOPURINOL 100 MG PO TABS
ORAL_TABLET | ORAL | 0 refills | Status: DC
Start: 2023-09-20 — End: 2024-01-09

## 2023-09-20 MED ORDER — POTASSIUM CHLORIDE CRYS ER 20 MEQ PO TBCR
EXTENDED_RELEASE_TABLET | ORAL | 3 refills | Status: DC
Start: 2023-09-20 — End: 2024-08-07

## 2023-09-20 MED ORDER — ROSUVASTATIN CALCIUM 5 MG PO TABS: 5.0000 mg | ORAL_TABLET | Freq: Every day | ORAL | 3 refills | Status: DC

## 2023-09-20 MED ORDER — METFORMIN HCL ER 750 MG PO TB24: 750.0000 mg | ORAL_TABLET | Freq: Every day | ORAL | 3 refills | Status: DC

## 2023-09-20 MED ORDER — LOSARTAN POTASSIUM 100 MG PO TABS
100.0000 mg | ORAL_TABLET | Freq: Every day | ORAL | 3 refills | Status: DC
Start: 2023-09-20 — End: 2024-03-28

## 2023-09-20 NOTE — Progress Notes (Addendum)
Subjective:  Patient ID: Juan Merritt,  male    DOB: 1951/12/09  Age: 72 y.o.    CC: Medical Management of Chronic Issues   HPI Farmer Mccahill Opiela presents for  follow-up of hypertension. Patient has no history of headache chest pain or shortness of breath or recent cough. Patient also denies symptoms of TIA such as numbness weakness lateralizing. Patient denies side effects from medication. States taking it regularly.  Patient also  in for follow-up of elevated cholesterol. Doing well without complaints on current medication. Side effects  of  myalgia and arthralgia and nausea. Also in today for liver function testing. Currently no chest pain, shortness of breath or other cardiovascular related symptoms noted.  Follow-up of diabetes. Patient does check blood sugar at home. Readings average 111 for the last 90 days checking TID.  Patient denies symptoms such as excessive hunger or urinary frequency, excessive hunger, nausea No significant hypoglycemic spells noted. Medications reviewed. Pt reports taking them regularly. Pt. denies complication/adverse reaction today.    History Sabin has a past medical history of Arthritis, BPH associated with nocturia, Chronic gout, COPD (chronic obstructive pulmonary disease) (HCC), History of colon polyps, History of COVID-19, Hyperlipidemia, mixed, Hypertension, Phimosis, and Type 2 diabetes mellitus (HCC).   He has a past surgical history that includes Laparoscopic cholecystectomy (2008); Tonsillectomy; Colonoscopy (12/01/2021); and Circumcision (N/A, 05/19/2022).   His family history includes Asthma in his mother; Diabetes in his father; Esophageal cancer in his paternal uncle; Heart attack in an other family member; Heart attack (age of onset: 39) in his father; Leukemia in his brother; Lung disease in an other family member.He reports that he quit smoking about 6 years ago. His smoking use included cigarettes and e-cigarettes. He started smoking  about 46 years ago. He has a 10 pack-year smoking history. His smokeless tobacco use includes chew. He reports current alcohol use. He reports that he does not use drugs.  Current Outpatient Medications on File Prior to Visit  Medication Sig Dispense Refill   albuterol (VENTOLIN HFA) 108 (90 Base) MCG/ACT inhaler Inhale 2 puffs into the lungs 2 (two) times daily. 18 g 5   amLODipine (NORVASC) 5 MG tablet Take 1 tablet (5 mg total) by mouth daily. 90 tablet 3   aspirin EC 81 MG tablet Take 81 mg by mouth daily. Swallow whole.     clotrimazole-betamethasone (LOTRISONE) cream Apply 1 application topically 2 (two) times daily. 30 g 0   colchicine 0.6 MG tablet TAKE TWICE DAILY FOR GOUT ATTACK. (MAY TAKE EVERY TWO HOURS UP TO 6 DOSES AT ACUTE ONSET) 180 tablet 0   Continuous Blood Gluc Sensor (FREESTYLE LIBRE 2 SENSOR) MISC USE TO TEST BLOOD SUGAR CONTINUOUSLY. Dx. E11.65 SEND TO [facility] FOR MEDICARE COVERAGE, DO NOT SEND TO LOCAL PHARMACY 2 each 0   fluticasone-salmeterol (ADVAIR) 500-50 MCG/ACT AEPB Inhale 1 puff into the lungs in the morning and at bedtime. 180 each 3   Insulin Pen Needle 31G X 8 MM MISC Use to inject insulin daily. DX: E11.59 100 each 12   ipratropium-albuterol (DUONEB) 0.5-2.5 (3) MG/3ML SOLN Take 3 mLs by nebulization every 4 (four) hours as needed. (Patient taking differently: Take 3 mLs by nebulization every 4 (four) hours as needed (SOB).) 360 mL 3   meloxicam (MOBIC) 15 MG tablet Take 1 tablet (15 mg total) by mouth daily. 90 tablet 3   tamsulosin (FLOMAX) 0.4 MG CAPS capsule TAKE 2 CAPSULES BY MOUTH AT BEDTIME FOR URINE FLOW  AND PROSTATE 180 capsule 2   torsemide (DEMADEX) 100 MG tablet Take 1 tablet (100 mg total) by mouth 2 (two) times daily. At arising and at lunch 180 tablet 2   No current facility-administered medications on file prior to visit.    ROS Review of Systems  Constitutional:  Negative for fever.  Respiratory:  Negative for shortness of breath.    Cardiovascular:  Negative for chest pain.  Gastrointestinal:  Positive for abdominal pain (heartburn from eating too much last week. Reponded to Tums).  Musculoskeletal:  Positive for myalgias (thigh pain in the morning eases up after walking around for 1/2 hour.). Negative for arthralgias.  Skin:  Negative for rash.    Objective:  BP 109/66   Pulse 81   Temp (!) 97.5 F (36.4 C)   Ht 5\' 10"  (1.778 m)   Wt 283 lb 3.2 oz (128.5 kg)   SpO2 95%   BMI 40.63 kg/m   BP Readings from Last 3 Encounters:  09/20/23 109/66  06/07/23 108/67  03/01/23 115/68    Wt Readings from Last 3 Encounters:  09/20/23 283 lb 3.2 oz (128.5 kg)  08/22/23 290 lb (131.5 kg)  06/07/23 288 lb 12.8 oz (131 kg)     Physical Exam Vitals reviewed.  Constitutional:      Appearance: He is well-developed.  HENT:     Head: Normocephalic and atraumatic.     Right Ear: External ear normal.     Left Ear: External ear normal.     Mouth/Throat:     Pharynx: No oropharyngeal exudate or posterior oropharyngeal erythema.  Eyes:     Pupils: Pupils are equal, round, and reactive to light.  Cardiovascular:     Rate and Rhythm: Normal rate and regular rhythm.     Heart sounds: No murmur heard. Pulmonary:     Effort: No respiratory distress.     Breath sounds: Normal breath sounds.  Musculoskeletal:     Cervical back: Normal range of motion and neck supple.  Neurological:     Mental Status: He is alert and oriented to person, place, and time.     Diabetic Foot Exam - Simple   No data filed     Lab Results  Component Value Date   HGBA1C 6.0 (H) 06/07/2023   HGBA1C 6.2 (H) 03/01/2023   HGBA1C 6.4 (H) 11/30/2022    Assessment & Plan:   Izzy was seen today for medical management of chronic issues.  Diagnoses and all orders for this visit:  Type 2 diabetes mellitus with hyperglycemia, with long-term current use of insulin (HCC) -     Bayer DCA Hb A1c Waived  Essential hypertension, benign -      CBC with Differential/Platelet -     CMP14+EGFR -     losartan (COZAAR) 100 MG tablet; Take 1 tablet (100 mg total) by mouth daily. -     potassium chloride SA (KLOR-CON M20) 20 MEQ tablet; TAKE 1 TABLET (20 MEQ TOTAL) DAILY BY MOUTH.  Mixed hyperlipidemia -     Lipid panel  Hyperuricemia -     allopurinol (ZYLOPRIM) 100 MG tablet; TAKE 1 TABLET (100 MG TOTAL) BY MOUTH DAILY. TO PREVENT GOUT  Hypertension associated with diabetes (HCC) -     losartan (COZAAR) 100 MG tablet; Take 1 tablet (100 mg total) by mouth daily.  Statin myopathy  COPD mixed type (HCC)  Morbid obesity (HCC)  Other orders -     metFORMIN (GLUCOPHAGE-XR) 750 MG 24 hr  tablet; Take 1 tablet (750 mg total) by mouth daily with breakfast. -     rosuvastatin (CRESTOR) 5 MG tablet; Take 1 tablet (5 mg total) by mouth daily. For cholesterol   I have changed Gertie Gowda. Mell's losartan, metFORMIN, and rosuvastatin. I am also having him maintain his ipratropium-albuterol, aspirin EC, Insulin Pen Needle, FreeStyle Libre 2 Sensor, clotrimazole-betamethasone, amLODipine, fluticasone-salmeterol, tamsulosin, torsemide, meloxicam, albuterol, colchicine, allopurinol, and potassium chloride SA.  Meds ordered this encounter  Medications   allopurinol (ZYLOPRIM) 100 MG tablet    Sig: TAKE 1 TABLET (100 MG TOTAL) BY MOUTH DAILY. TO PREVENT GOUT    Dispense:  90 tablet    Refill:  0   losartan (COZAAR) 100 MG tablet    Sig: Take 1 tablet (100 mg total) by mouth daily.    Dispense:  90 tablet    Refill:  3   metFORMIN (GLUCOPHAGE-XR) 750 MG 24 hr tablet    Sig: Take 1 tablet (750 mg total) by mouth daily with breakfast.    Dispense:  90 tablet    Refill:  3   potassium chloride SA (KLOR-CON M20) 20 MEQ tablet    Sig: TAKE 1 TABLET (20 MEQ TOTAL) DAILY BY MOUTH.    Dispense:  90 tablet    Refill:  3   rosuvastatin (CRESTOR) 5 MG tablet    Sig: Take 1 tablet (5 mg total) by mouth daily. For cholesterol    Dispense:  90  tablet    Refill:  3   Weight loss discussed. Off statin therapy. Will hold off on med until he has been off longer to see what cholesterol will do. Low fat diet necessary.   Follow-up: Return in about 3 months (around 12/21/2023).  Mechele Claude, M.D.

## 2023-09-21 ENCOUNTER — Other Ambulatory Visit: Payer: Self-pay | Admitting: *Deleted

## 2023-09-21 DIAGNOSIS — R748 Abnormal levels of other serum enzymes: Secondary | ICD-10-CM

## 2023-09-21 LAB — CMP14+EGFR
ALT: 118 IU/L — ABNORMAL HIGH (ref 0–44)
AST: 64 IU/L — ABNORMAL HIGH (ref 0–40)
Albumin: 4.3 g/dL (ref 3.9–4.9)
Alkaline Phosphatase: 70 IU/L (ref 44–121)
BUN/Creatinine Ratio: 17 (ref 10–24)
BUN: 21 mg/dL (ref 8–27)
Bilirubin Total: 0.3 mg/dL (ref 0.0–1.2)
CO2: 26 mmol/L (ref 20–29)
Calcium: 9.2 mg/dL (ref 8.6–10.2)
Chloride: 101 mmol/L (ref 96–106)
Creatinine, Ser: 1.27 mg/dL (ref 0.76–1.27)
Globulin, Total: 2.6 g/dL (ref 1.5–4.5)
Glucose: 103 mg/dL — ABNORMAL HIGH (ref 70–99)
Potassium: 3.8 mmol/L (ref 3.5–5.2)
Sodium: 143 mmol/L (ref 134–144)
Total Protein: 6.9 g/dL (ref 6.0–8.5)
eGFR: 61 mL/min/{1.73_m2} (ref >59)

## 2023-09-21 LAB — CBC WITH DIFFERENTIAL/PLATELET
Basophils Absolute: 0 10*3/uL (ref 0.0–0.2)
Basos: 1 %
EOS (ABSOLUTE): 0.2 10*3/uL (ref 0.0–0.4)
Eos: 5 %
Hematocrit: 41.5 % (ref 37.5–51.0)
Hemoglobin: 13.9 g/dL (ref 13.0–17.7)
Immature Grans (Abs): 0 10*3/uL (ref 0.0–0.1)
Immature Granulocytes: 0 %
Lymphocytes Absolute: 2.4 10*3/uL (ref 0.7–3.1)
Lymphs: 45 %
MCH: 31.8 pg (ref 26.6–33.0)
MCHC: 33.5 g/dL (ref 31.5–35.7)
MCV: 95 fL (ref 79–97)
Monocytes Absolute: 0.7 10*3/uL (ref 0.1–0.9)
Monocytes: 13 %
Neutrophils Absolute: 1.9 10*3/uL (ref 1.4–7.0)
Neutrophils: 36 %
Platelets: 188 10*3/uL (ref 150–450)
RBC: 4.37 x10E6/uL (ref 4.14–5.80)
RDW: 13.4 % (ref 11.6–15.4)
WBC: 5.2 10*3/uL (ref 3.4–10.8)

## 2023-09-21 LAB — LIPID PANEL
Cholesterol, Total: 144 mg/dL (ref 100–199)
HDL: 39 mg/dL — ABNORMAL LOW (ref >39)
LDL CALC COMMENT:: 3.7 ratio (ref 0.0–5.0)
LDL Chol Calc (NIH): 84 mg/dL (ref 0–99)
Triglycerides: 116 mg/dL (ref 0–149)
VLDL Cholesterol Cal: 21 mg/dL (ref 5–40)

## 2023-10-13 ENCOUNTER — Other Ambulatory Visit: Payer: Self-pay | Admitting: Family Medicine

## 2023-10-13 DIAGNOSIS — I10 Essential (primary) hypertension: Secondary | ICD-10-CM

## 2023-10-14 DIAGNOSIS — E1165 Type 2 diabetes mellitus with hyperglycemia: Secondary | ICD-10-CM | POA: Diagnosis not present

## 2023-10-20 ENCOUNTER — Other Ambulatory Visit: Payer: Medicare HMO

## 2023-10-20 DIAGNOSIS — R748 Abnormal levels of other serum enzymes: Secondary | ICD-10-CM

## 2023-10-21 LAB — CMP14+EGFR
ALT: 120 IU/L — ABNORMAL HIGH (ref 0–44)
AST: 62 IU/L — ABNORMAL HIGH (ref 0–40)
Albumin: 4.2 g/dL (ref 3.9–4.9)
Alkaline Phosphatase: 67 IU/L (ref 44–121)
BUN/Creatinine Ratio: 16 (ref 10–24)
BUN: 20 mg/dL (ref 8–27)
Bilirubin Total: 0.3 mg/dL (ref 0.0–1.2)
CO2: 24 mmol/L (ref 20–29)
Calcium: 8.8 mg/dL (ref 8.6–10.2)
Chloride: 102 mmol/L (ref 96–106)
Creatinine, Ser: 1.23 mg/dL (ref 0.76–1.27)
Globulin, Total: 2.6 g/dL (ref 1.5–4.5)
Glucose: 121 mg/dL — ABNORMAL HIGH (ref 70–99)
Potassium: 3.9 mmol/L (ref 3.5–5.2)
Sodium: 140 mmol/L (ref 134–144)
Total Protein: 6.8 g/dL (ref 6.0–8.5)
eGFR: 63 mL/min/{1.73_m2} (ref >59)

## 2023-10-23 ENCOUNTER — Other Ambulatory Visit: Payer: Self-pay

## 2023-10-23 DIAGNOSIS — F17210 Nicotine dependence, cigarettes, uncomplicated: Secondary | ICD-10-CM

## 2023-10-23 DIAGNOSIS — Z87891 Personal history of nicotine dependence: Secondary | ICD-10-CM

## 2023-10-23 DIAGNOSIS — Z122 Encounter for screening for malignant neoplasm of respiratory organs: Secondary | ICD-10-CM

## 2023-10-26 ENCOUNTER — Ambulatory Visit (INDEPENDENT_AMBULATORY_CARE_PROVIDER_SITE_OTHER): Payer: Medicare HMO | Admitting: Acute Care

## 2023-10-26 DIAGNOSIS — F17210 Nicotine dependence, cigarettes, uncomplicated: Secondary | ICD-10-CM

## 2023-10-26 NOTE — Progress Notes (Signed)
 Virtual Visit via Video Note  I connected with Juan Merritt on 10/26/23 at  1:30 PM EST by a video enabled telemedicine application and verified that I am speaking with the correct person using two identifiers.  Location: Patient: in home Provider: [address], [place], [place], [address]  Shared Decision Making Visit Lung Cancer Screening Program [phone])  Eligibility: Age 72 y.o. Pack Years Smoking History Calculation 100 (# packs/per year x # years smoked) Recent History of coughing up blood  no Unexplained weight loss? no ( >Than 15 pounds within the last 6 months ) Prior History Lung / other cancer no (Diagnosis within the last 5 years already requiring surveillance chest CT Scans). Smoking Status Current Smoker Former Smokers: Years since quit: NA  Quit Date: NA  Visit Components: Discussion included one or more decision making aids. yes Discussion included risk/benefits of screening. yes Discussion included potential follow up diagnostic testing for abnormal scans. yes Discussion included meaning and risk of over diagnosis. yes Discussion included meaning and risk of False Positives. yes Discussion included meaning of total radiation exposure. yes  Counseling Included: Importance of adherence to annual lung cancer LDCT screening. yes Impact of comorbidities on ability to participate in the program. yes Ability and willingness to under diagnostic treatment. yes  Smoking Cessation Counseling: Current Smokers:  Discussed importance of smoking cessation. yes Information about tobacco cessation classes and interventions provided to patient. yes Patient provided with "ticket" for LDCT Scan. yes Symptomatic Patient. no  Counseling NA Diagnosis Code: Tobacco Use Z72.0 Asymptomatic Patient yes  Counseling (Intermediate counseling: > three minutes counseling) M5784 Former Smokers:  Discussed the importance of maintaining cigarette abstinence. yes Diagnosis  Code: Personal History of Nicotine Dependence. O96.295 Information about tobacco cessation classes and interventions provided to patient. Yes Patient provided with "ticket" for LDCT Scan. yes Written Order for Lung Cancer Screening with LDCT placed in Epic. Yes (CT Chest Lung Cancer Screening Low Dose W/O CM) [id number] Z12.2-Screening of respiratory organs Z87.891-Personal history of nicotine dependence   Karl Bales, RN 10/26/23

## 2023-10-26 NOTE — Patient Instructions (Signed)
 Thank you for participating in the  Lung Cancer Screening Program. It was our pleasure to meet you today. We will call you with the results of your scan within the next few days. Your scan will be assigned a Lung RADS category score by the physicians reading the scans.  This Lung RADS score determines follow up scanning.  See below for description of categories, and follow up screening recommendations. We will be in touch to schedule your follow up screening annually or based on recommendations of our providers. We will fax a copy of your scan results to your Primary Care Physician, or the physician who referred you to the program, to ensure they have the results. Please call the office if you have any questions or concerns regarding your scanning experience or results.  Our office number is [phone]. Please speak with Abigail Miyamoto, RN., Karlton Lemon RN, or Pietro Cassis RN. They are  our Lung Cancer Screening RN.'s If They are unavailable when you call, Please leave a message on the voice mail. We will return your call at our earliest convenience.This voice mail is monitored several times a day.  Remember, if your scan is normal, we will scan you annually as long as you continue to meet the criteria for the program. (Age 70-80, Current smoker or smoker who has quit within the last 15 years). If you are a smoker, remember, quitting is the single most powerful action that you can take to decrease your risk of lung cancer and other pulmonary, breathing related problems. We know quitting is hard, and we are here to help.  Please let us know if there is anything we can do to help you meet your goal of quitting. If you are a former smoker, Counselling psychologist. We are proud of you! Remain smoke free! Remember you can refer friends or family members through the number above.  We will screen them to make sure they meet criteria for the program. Thank you for helping [place] take better care of you  by participating in Lung Screening.   Lung RADS Categories:  Lung RADS 1: no nodules or definitely non-concerning nodules.  Recommendation is for a repeat annual scan in 12 months.  Lung RADS 2:  nodules that are non-concerning in appearance and behavior with a very low likelihood of becoming an active cancer. Recommendation is for a repeat annual scan in 12 months.  Lung RADS 3: nodules that are probably non-concerning , includes nodules with a low likelihood of becoming an active cancer.  Recommendation is for a 40-month repeat screening scan. Often noted after an upper respiratory illness. We will be in touch to make sure you have no questions, and to schedule your 70-month scan.  Lung RADS 4 A: nodules with concerning findings, recommendation is most often for a follow up scan in 3 months or additional testing based on our provider's assessment of the scan. We will be in touch to make sure you have no questions and to schedule the recommended 3 month follow up scan.  Lung RADS 4 B:  indicates findings that are concerning. We will be in touch with you to schedule additional diagnostic testing based on our provider's  assessment of the scan.  You can receive free nicotine replacement therapy ( patches, gum or mints) by calling 1-800-QUIT NOW. Please call so we can get you on the path to becoming  a non-smoker. I know it is hard, but you can do this!  Other options for assistance in  smoking cessation ( As covered by your insurance benefits)  Hypnosis for smoking cessation  [employer] [phone]  Acupuncture for smoking cessation  [employer] [phone]

## 2023-10-31 ENCOUNTER — Other Ambulatory Visit: Payer: Self-pay | Admitting: Family Medicine

## 2023-11-03 ENCOUNTER — Ambulatory Visit (HOSPITAL_COMMUNITY)
Admission: RE | Admit: 2023-11-03 | Discharge: 2023-11-03 | Disposition: A | Discharge status: Home / Self Care | Payer: Medicare HMO | Source: Ambulatory Visit | Attending: *Deleted | Admitting: *Deleted

## 2023-11-03 DIAGNOSIS — Z87891 Personal history of nicotine dependence: Secondary | ICD-10-CM

## 2023-11-03 DIAGNOSIS — F17210 Nicotine dependence, cigarettes, uncomplicated: Secondary | ICD-10-CM | POA: Diagnosis not present

## 2023-11-03 DIAGNOSIS — Z122 Encounter for screening for malignant neoplasm of respiratory organs: Secondary | ICD-10-CM | POA: Diagnosis not present

## 2023-11-14 ENCOUNTER — Telehealth: Payer: Self-pay

## 2023-11-14 DIAGNOSIS — E1165 Type 2 diabetes mellitus with hyperglycemia: Secondary | ICD-10-CM | POA: Diagnosis not present

## 2023-11-14 NOTE — Telephone Encounter (Signed)
[name], can you call and check on these results for the patient.   Copied from CRM [id number]. Topic: Clinical - Lab/Test Results >> Nov 14, 2023 11:50 AM Juan Merritt wrote: PT wants to get his CT Chest Results. Please call at [phone] with results.

## 2023-11-14 NOTE — Telephone Encounter (Signed)
I have called [facility] to push up the read.

## 2023-11-15 NOTE — Telephone Encounter (Signed)
Report unavailable

## 2023-11-20 NOTE — Telephone Encounter (Signed)
I have called [facility] for reading on this again

## 2023-11-20 NOTE — Telephone Encounter (Signed)
Just saw this was still in pools and no report available yet.

## 2023-11-24 NOTE — Telephone Encounter (Signed)
Contacted [facility] again - they are having IT issues with these exams and will look into this one and move it along for read.  Patient has been notified of delay

## 2023-11-28 ENCOUNTER — Telehealth: Payer: Self-pay | Admitting: Family Medicine

## 2023-11-28 ENCOUNTER — Other Ambulatory Visit: Payer: Self-pay | Admitting: Family Medicine

## 2023-11-28 DIAGNOSIS — R918 Other nonspecific abnormal finding of lung field: Secondary | ICD-10-CM

## 2023-11-28 NOTE — Telephone Encounter (Signed)
Discussed CT with Pt. Referral written for Pulmonary

## 2023-11-28 NOTE — Telephone Encounter (Signed)
PATIENT WANTS RESULTS FROM LAST BLOOD WORK RECHECKING HIS LIVER AND IS CT LUNG SCAN

## 2023-11-28 NOTE — Telephone Encounter (Unsigned)
Copied from CRM [id number]. Topic: Clinical - Lab/Test Results >> Nov 28, 2023  9:21 AM Donita Brooks wrote: Reason for CRM: pt wife called in stating that pt hasn't recent his results yet and still having pain in ankle. Pt wife would like for someone to give him a call at [phone]

## 2023-12-01 ENCOUNTER — Encounter: Payer: Self-pay | Admitting: Internal Medicine

## 2023-12-01 ENCOUNTER — Telehealth: Payer: Self-pay | Admitting: Acute Care

## 2023-12-01 ENCOUNTER — Ambulatory Visit: Payer: Medicare HMO | Admitting: Internal Medicine

## 2023-12-01 VITALS — BP 109/69 | HR 95 | Temp 97.6°F | Ht 70.0 in | Wt 279.6 lb

## 2023-12-01 DIAGNOSIS — R911 Solitary pulmonary nodule: Secondary | ICD-10-CM

## 2023-12-01 DIAGNOSIS — J449 Chronic obstructive pulmonary disease, unspecified: Secondary | ICD-10-CM

## 2023-12-01 DIAGNOSIS — F17210 Nicotine dependence, cigarettes, uncomplicated: Secondary | ICD-10-CM

## 2023-12-01 DIAGNOSIS — C3411 Malignant neoplasm of upper lobe, right bronchus or lung: Secondary | ICD-10-CM | POA: Insufficient documentation

## 2023-12-01 MED ORDER — BREZTRI AEROSPHERE 160-9-4.8 MCG/ACT IN AERO: INHALATION_SPRAY | RESPIRATORY_TRACT | 11 refills | Status: DC

## 2023-12-01 MED ORDER — BREZTRI AEROSPHERE 160-9-4.8 MCG/ACT IN AERO: 2.0000 | INHALATION_SPRAY | Freq: Two times a day (BID) | RESPIRATORY_TRACT | Status: DC

## 2023-12-01 NOTE — Assessment & Plan Note (Signed)
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  Discussed in detail all the  indications, usual  risks and alternatives  relative to the benefits with patient who agrees to proceed with w/u as outlined.

## 2023-12-01 NOTE — Telephone Encounter (Signed)
Patient was seen by Dr. Sherene Sires today in the [place] office. He has reviewed the CT results with patient. He has ordered a PET scan to better evaluate the  Multi lobular pulmonary masslike lesion in the medial right lung apex that measures 3.2 cm.  I called and talked with Juan Merritt to make sure he had no additional questions. He had god understanding of what a PET scan does, and understands we will follow up with him once we see the results of the scan.   Sherre Lain, Robynn Pane and [place], can we watch for the results of the PET scan. It was ordered by Dr. Sherene Sires, so I will not get the results .  Dr. Sherene Sires, thanks again for ordering the PET scan.

## 2023-12-01 NOTE — Patient Instructions (Signed)
Plan A = Automatic = Always=    breztri Take 2 puffs first thing in am and then another 2 puffs about 12 hours later.    Work on inhaler technique:  relax and gently blow all the way out then take a nice smooth full deep breath back in, triggering the inhaler at same time you start breathing in.  Hold breath in for at least  5 seconds if you can. Blow out breztri thru nose. Rinse and gargle with water when done.  If mouth or throat bother you at all,  try brushing teeth/gums/tongue with arm and hammer toothpaste/ make a slurry and gargle and spit out.     Plan B = Backup (to supplement plan A, not to replace it) Only use your albuterol inhaler as a rescue medication to be used if you can't catch your breath by resting or doing a relaxed purse lip breathing pattern.  - The less you use it, the better it will work when you need it. - Ok to use the inhaler up to 2 puffs  every 4 hours if you must but call for appointment if use goes up over your usual need - Don't leave home without it !!  (think of it like the spare tire for your car)   Plan C = Crisis (instead of Plan B but only if Plan B stops working) - only use your albuterol nebulizer if you first try Plan B and it fails to help > ok to use the nebulizer up to every 4 hours but if start needing it regularly call for immediate appointment   My office will be contacting you by phone for referral to PET scan next available and same PFTs   - if you don't hear back from my office within one week please call us back or notify us thru MyChart and we'll address it right away.    Please schedule a follow up office visit in 6 weeks, call sooner if needed with all medications /inhalers/ solutions in hand so we can verify exactly what you are taking. This includes all medications from all doctors and over the counters

## 2023-12-01 NOTE — Progress Notes (Signed)
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 since pna 2015 with documented GOLD II copd 04/30/15  much worse x mid March 2015 referred by Juan Merritt at [place] but in meantime  > admitted [facility] with aecopd:   Admit date: 03/23/2015 Discharge date: 03/26/2015      Discharge Diagnoses:  Principal Problem:   COPD exacerbation   Acute respiratory failure   Tobacco abuse     Filed Weights     03/23/15 2210  03/25/15 0512  03/26/15 0938   Weight:  113.626 kg (250 lb 8 oz)  114.2 kg (251 lb 12.3 oz)  113.1 kg (249 lb 5.4 oz)     History of present illness:   Patient is a 72 year old male, 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 He was found to have an O2 sat of 87% in the outpatient setting for which he was referred to the emergency department and admitted for COPD exacerbation  Hospital Course:   Chronic obstructive pulmonary disease exacerbation. - Patient was told to have COPD 6 months ago. - Has ongoing 80-pack-year smoking history. Tobacco cessation counseled. - Patient has appointment to see a pulmonologist on 03/27/15- advised to keep that appointment - Treated with oxygen, bronchodilators, IV Solu-Medrol and levofloxacin. - Clinically improved and desaturation evaluation completed and maintains oxygen saturation in the low 90s on room air at rest and with activity. - Chest x-ray without pneumonia. - DC home on prednisone taper and complete 7 days of oral levofloxacin. - Has pulmonology consultation appointment tomorrow at which time may consider starting maintenance medications i.e. Advair/Symbicort.   Acute respiratory failure with  hypoxia. - Evidence by an O2 sat of 87%, having new oxygen requirement, likely secondary to COPD exacerbation - Continue management as above and titrate down oxygen to maintain saturation between 89-92 percent. - D dimer: neg - Hypoxia resolved.  Tobacco abuse. - On nicotine patch - Cessation counseled extensively and repeatedly.    Essential hypertension  - Controlled.   - Continue amlodipine and Cozaar.    HLD - As per patient and spouse report. - However not on statins.   History of gout - Continue allopurinol. - No acute flare  Chronic lower extremity edema - Unclear etiology. - Serum albumin normal. - Recommend getting urine microscopy and further workup as outpatient as deemed necessary. - 2-D echo results as below showing normal LVEF and grade 1 diastolic dysfunction. Clinically does not appear to be due to CHF. - Start low-dose Lasix when necessary.  Consultations: None  Procedures: 2-D echo 03/25/15: Study Conclusions  - Left ventricle: Wall thickness was increased in a pattern of mild   LVH. Systolic function was normal. The estimated ejection   fraction was in the range of 60% to 65%. Wall motion was normal;   there were no regional wall motion abnormalities. Doppler   parameters are consistent with abnormal left ventricular   relaxation (grade 1 diastolic dysfunction).        03/27/2015 1st [facility] [facility] office visit/ Juan Merritt   Chief Complaint  Patient presents with   Advice Only    SOB at all times x  1 week; cough, prod at times;   still smoking but working on quiting 75% better since d/c but using saba around the clock in hfa and neb form  Onset was insidious/pattern progressive x sev weeks to a month pta rec The key is to stop smoking completely before smoking completely stops you!  Finish your prednisone and levaquin Plan A = automatic = Symbicort 160 Take 2 puffs first thing in am and then another 2 puffs about 12 hours later.  Plan  B=backup Only use your albuterol (proventil)  as a rescue medication Plan C =Crisis = use nebulizer if you must up to every 4 hours if needed    12/01/2023  Re-establish  ov/[place] office/Juan Merritt re: GOLD 2 copd  maint on wixella 500  / resumed smoking / vaping and referred now by Juan Merritt re SPN on LDSCT (see below)  Chief Complaint  Patient presents with   Consult    [facility] on CT Lung cancer screening.  C/o SOB with exertion/walking from house to tool building in yard.  Dyspnea:  house to tool shed easier p albuterol / does [employer] fine / overall very sedentary though  Cough: none  Sleeping:  sleeps horizontally on couch 2 pillows s resp cc  SABA use: avg 0 - 2 x per day  02: none     No obvious day to day or daytime variability or assoc excess/ purulent sputum or mucus plugs or hemoptysis or cp or chest tightness, subjective wheeze or overt sinus or hb symptoms.    Also denies any obvious fluctuation of symptoms with weather or environmental changes or other aggravating or alleviating factors except as outlined above   No unusual exposure hx or h/o childhood pna/ asthma or knowledge of premature birth.  Current Allergies, Complete Past Medical History, Past Surgical History, Family History, and Social History were reviewed in [employer] record.  ROS  The following are not active complaints unless bolded Hoarseness, sore throat, dysphagia, dental problems, itching, sneezing,  nasal congestion or discharge of excess mucus or purulent secretions, ear ache,   fever, chills, sweats, unintended wt loss or wt gain, classically pleuritic or exertional cp,  orthopnea pnd or arm/hand swelling  or leg swellingR>L , presyncope, palpitations, abdominal pain, anorexia, nausea, vomiting, diarrhea  or change in bowel habits or change in bladder habits, change in stools or change in urine, dysuria, hematuria,  rash, arthralgias, visual complaints, headache, numbness, weakness or  ataxia or problems with walking or coordination,  change in mood or  memory.        Current Meds  Medication Sig   albuterol (VENTOLIN HFA) 108 (90 Base) MCG/ACT inhaler Inhale 2 puffs into the lungs 2 (two) times daily.   allopurinol (ZYLOPRIM) 100 MG tablet TAKE 1 TABLET (100 MG TOTAL) BY MOUTH DAILY. TO PREVENT GOUT   amLODipine (NORVASC) 5 MG tablet TAKE 1 TABLET (5 MG TOTAL) BY MOUTH DAILY.   aspirin EC 81 MG tablet Take 81 mg by mouth daily. Swallow whole.   Budeson-Glycopyrrol-Formoterol (BREZTRI AEROSPHERE) 160-9-4.8 MCG/ACT AERO Inhale 2 puffs into the lungs in the morning and at bedtime.   Budeson-Glycopyrrol-Formoterol (BREZTRI AEROSPHERE) 160-9-4.8 MCG/ACT AERO Take 2 puffs first thing in am and then another 2 puffs about 12 hours later.   clotrimazole-betamethasone (LOTRISONE) cream Apply 1 application topically 2 (two) times daily.   colchicine 0.6 MG tablet TAKE TWICE DAILY FOR GOUT ATTACK. (MAY TAKE EVERY TWO HOURS UP TO 6 DOSES AT ACUTE  ONSET)   Continuous Blood Gluc Sensor (FREESTYLE LIBRE 2 SENSOR) MISC USE TO TEST BLOOD SUGAR CONTINUOUSLY. Dx. E11.65 SEND TO [facility] FOR MEDICARE COVERAGE, DO NOT SEND TO LOCAL PHARMACY   Insulin Pen Needle 31G X 8 MM MISC Use to inject insulin daily. DX: E11.59   ipratropium-albuterol (DUONEB) 0.5-2.5 (3) MG/3ML SOLN Take 3 mLs by nebulization every 4 (four) hours as needed. (Patient taking differently: Take 3 mLs by nebulization every 4 (four) hours as needed (SOB).)   losartan (COZAAR) 100 MG tablet Take 1 tablet (100 mg total) by mouth daily.   meloxicam (MOBIC) 15 MG tablet Take 1 tablet (15 mg total) by mouth daily.   metFORMIN (GLUCOPHAGE-XR) 750 MG 24 hr tablet Take 1 tablet (750 mg total) by mouth daily with breakfast.   potassium chloride SA (KLOR-CON M20) 20 MEQ tablet TAKE 1 TABLET (20 MEQ TOTAL) DAILY BY MOUTH.   rosuvastatin (CRESTOR) 5 MG tablet Take 1 tablet (5 mg total) by mouth daily. For cholesterol   tamsulosin  (FLOMAX) 0.4 MG CAPS capsule TAKE 2 CAPSULES BY MOUTH AT BEDTIME FOR URINE FLOW AND PROSTATE   torsemide (DEMADEX) 100 MG tablet Take 1 tablet (100 mg total) by mouth 2 (two) times daily. At arising and at lunch   [DISCONTINUED] fluticasone-salmeterol (ADVAIR) 500-50 MCG/ACT AEPB Inhale 1 puff into the lungs in the morning and at bedtime.                   Objective:   Physical Exam     Wts  12/01/2023     279  05/01/2015       261    03/27/15 262 lb (118.842 kg)  03/26/15 249 lb 5.4 oz (113.1 kg)  03/23/15 246 lb (111.585 kg)    Vital signs reviewed  12/01/2023  - Note at rest 02 sats  92% on RA   General appearance:    Morbidly obese (by BMI) amb wm slt gruff voice   HEENT : Oropharynx  clear   Nasal turbinates nl    NECK :  without  apparent JVD/ palpable Nodes/TM    LUNGS: no acc muscle use,  Mild barrel  contour chest wall with bilateral  distant insp/ exp rhonchi and  without cough on insp or exp maneuvers  and mild  Hyperresonant  to  percussion bilaterally     CV:  RRR  no s3 or murmur or increase in P2, and trace R > L LE edema    ABD:  quite obese soft and nontender with pos end  insp Hoover's  in the supine position.  No bruits or organomegaly appreciated   MS:  Nl gait/ ext warm without deformities Or obvious joint restrictions  calf tenderness, cyanosis or clubbing     SKIN: warm and dry without lesions    NEURO:  alert, approp, nl sensorium with  no motor or cerebellar deficits apparent.           I personally reviewed images and agree with radiology impression as follows:  Chest LDSCT     11/03/23  Centrilobular emphsyema noted. Multi lobular pulmonary lesion identified in the medial right lung apex with volume derived equivalent diameter on the order of 3.2 cm   Assessment:

## 2023-12-01 NOTE — Assessment & Plan Note (Signed)
4-5 min discussion re active cigarette smoking in addition to office E&M  Ask about tobacco use:   ongoing  Advise quitting  I took an extended  opportunity with this patient to outline the consequences of continued cigarette use  in airway disorders based on all the data we have from the multiple national lung health studies (perfomed over decades at millions of dollars in cost)  indicating that smoking cessation, not choice of inhalers or pulmonary physicians, is the most important aspect of his care.   Assess willingness:  Not committed at this point Assist in quit attempt:  Suggested reasonable to try e-cigs as an optional  "one way bridge"  Off all tobacco products (but certainly  not using both at same time, as is the case at present  Arrange follow up:   Follow up per Primary Care planned

## 2023-12-01 NOTE — Assessment & Plan Note (Addendum)
05/01/2015   erv 45% @ wt = 261 lb c/w effects of obesity    Body mass index is 40.12 kg/m.  -  trending up at wt =279 12/01/2023  Lab Results  Component Value Date   TSH 0.452 03/30/2018    Contributing to doe and risk of GERD/dvt/ pe  >>>   reviewed the need and the process to achieve and maintain neg calorie balance > defer f/u primary care including intermittently monitoring thyroid status     Each maintenance medication was reviewed in detail including emphasizing most importantly the difference between maintenance and prns and under what circumstances the prns are to be triggered using an action plan format where appropriate.  Total time for H and P, chart review, counseling, reviewing hfa device(s) and generating customized AVS unique to this office visit / same day charting = 60 min new pt eval

## 2023-12-01 NOTE — Assessment & Plan Note (Addendum)
Active smoker  - PFTs  05/01/2015  FEV1  2.50 (69%) ratio 69 p 11% improvement from saba  p am symbicort 160 and dlco 82/ erv 45 @ wt = 261 lb   - Chest LDSCT     11/03/23  Centrilobular emphsyema  - 12/01/2023  After extensive coaching inhaler device,  effectiveness =    75% from a baseline < 35%   >>> breztri trial plus more approp saba use  - repeat PFTS  ordered 12/01/2023 >>>    Group D (now reclassified as E) in terms of symptom/risk and laba/lama/ICS  therefore appropriate rx at this point >>>  breztri 2bid and saba prn pending repeat pfts   Comment: Re SABA :  I spent extra time with pt today reviewing appropriate use of albuterol for prn use on exertion with the following points: 1) saba is for relief of sob that does not improve by walking a slower pace or resting but rather if the pt does not improve after trying this first. 2) If the pt is convinced, as many are, that saba helps recover from activity faster then it's easy to tell if this is the case by re-challenging : ie stop, take the inhaler, then p 5 minutes try the exact same activity (intensity of workload) that just caused the symptoms and see if they are substantially diminished or not after saba 3) if there is an activity that reproducibly causes the symptoms, try the saba 15 min before the activity on alternate days   If in fact the saba really does help, then fine to continue to use it prn but advised may need to look closer at the maintenance regimen being used to achieve better control of airways disease with exertion.

## 2023-12-04 NOTE — Telephone Encounter (Signed)
Noted, reminder set

## 2023-12-05 ENCOUNTER — Telehealth: Payer: Self-pay | Admitting: Internal Medicine

## 2023-12-05 NOTE — Telephone Encounter (Signed)
Spoke with wife, Nelva Bush regarding the Thursday 01/25/24 2:00 pm PFT at [facility] time 1:45 pm at the first floor registration desk----will mail information to patient---also discussed scheduling the 6 weeks follow up appointment with Dr. Tora Duck to call back and schedule after PET scan results were in.

## 2023-12-05 NOTE — Addendum Note (Signed)
Addended by: Shelby Dubin on: 12/05/2023 11:47 AM   Modules accepted: Orders

## 2023-12-07 ENCOUNTER — Encounter (HOSPITAL_COMMUNITY)
Admission: RE | Admit: 2023-12-07 | Discharge: 2023-12-07 | Disposition: A | Discharge status: Home / Self Care | Payer: Medicare HMO | Source: Ambulatory Visit | Attending: Internal Medicine | Admitting: Internal Medicine

## 2023-12-07 DIAGNOSIS — R911 Solitary pulmonary nodule: Secondary | ICD-10-CM | POA: Insufficient documentation

## 2023-12-07 MED ORDER — FLUDEOXYGLUCOSE F - 18 (FDG) INJECTION
14.9000 | Freq: Once | INTRAVENOUS | Status: AC | PRN
  Administered 2023-12-07: 14.9 via INTRAVENOUS

## 2023-12-11 NOTE — Telephone Encounter (Signed)
Copied from CRM [id number]. Topic: Clinical - Lab/Test Results >> Dec 11, 2023 12:32 PM Antony Haste wrote: Reason for CRM: PT's wife wanted to follow-up on PT's PET-scan lab results to determine if it's has came in yet. PT wife's # for callback: [phone]

## 2023-12-14 DIAGNOSIS — E1165 Type 2 diabetes mellitus with hyperglycemia: Secondary | ICD-10-CM | POA: Diagnosis not present

## 2023-12-15 ENCOUNTER — Telehealth: Payer: Self-pay | Admitting: Internal Medicine

## 2023-12-15 NOTE — Telephone Encounter (Signed)
Pt calling in for Pet Scan Results, informed Pt wife they are not ready yet.

## 2023-12-20 NOTE — Progress Notes (Signed)
 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 with documented GOLD II copd 04/30/15  much worse x mid March 2015 referred by [name] at [place] but in meantime  > admitted [facility] with aecopd:   Admit date: 03/23/2015 Discharge date: 03/26/2015      Discharge Diagnoses:  Principal Problem:   COPD exacerbation   Acute respiratory failure   Tobacco abuse     Filed Weights     03/23/15 2210  03/25/15 0512  03/26/15 0938   Weight:  113.626 kg (250 lb 8 oz)  114.2 kg (251 lb 12.3 oz)  113.1 kg (249 lb 5.4 oz)     History of present illness:   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 He was found to have an O2 sat of 87% in the outpatient setting for which he was referred to the emergency department and admitted for COPD exacerbation  Hospital Course:   Chronic obstructive pulmonary disease exacerbation. - Patient was told to have COPD 6 months ago. - Has ongoing 80-pack-year smoking history. Tobacco cessation counseled. - Patient has appointment to see a pulmonologist on 03/27/15- advised to keep that appointment - Treated with oxygen , bronchodilators, IV Solu-Medrol  and levofloxacin . - Clinically improved and desaturation evaluation completed and maintains oxygen  saturation in the low 90s on room air at rest and with activity. - DC home on prednisone  taper and complete 7 days of oral levofloxacin . - Has pulmonology consultation appointment tomorrow at which time may consider starting maintenance medications i.e. Advair/Symbicort .   Acute respiratory failure with hypoxia. - Evidence by an O2 sat  of 87%, having new oxygen  requirement, likely secondary to COPD exacerbation - Continue management as above and titrate down oxygen  to maintain saturation between 89-92 percent. - D dimer: neg    Tobacco abuse. - On nicotine  patch - Cessation counseled extensively and repeatedly.    Essential hypertension  - Controlled.   - Continue amlodipine  and Cozaar .    HLD - As per patient and spouse report. - However not on statins.   History of gout - Continue allopurinol . - No acute flare  Chronic lower extremity edema - Unclear etiology. - Serum albumin normal. - Recommend getting urine microscopy and further workup as outpatient as deemed necessary. - 2-D echo results as below showing normal LVEF and grade 1 diastolic dysfunction. Clinically does not appear to be due to CHF. - Start low-dose Lasix  when necessary.     Procedures: 2-D echo 03/25/15: Study Conclusions  - Left ventricle: Wall thickness was increased in a pattern of mild   LVH. Systolic function was normal. The estimated ejection   fraction was in the range of 60% to 65%. Wall motion was normal;   there were no regional wall motion abnormalities. Doppler   parameters are consistent with abnormal left ventricular   relaxation (grade 1 diastolic dysfunction).        03/27/2015 1st [facility] [facility] office visit/ [name]   Chief Complaint  Patient presents with   Advice Only    SOB at all times x 1 week; cough, prod at  times;   still smoking but working on quiting 75% better since d/c but using saba around the clock in hfa and neb form  Onset was insidious/pattern progressive x sev weeks to a month pta rec The key is to stop smoking completely before smoking completely stops you!  Finish your prednisone  and levaquin  Plan A = automatic = Symbicort  160 Take 2 puffs first thing in am and then another 2 puffs about 12 hours later.  Plan B=backup Only use your albuterol  (proventil )  as a rescue medication Plan C  =Crisis = use nebulizer if you must up to every 4 hours if needed    12/01/2023  Re-establish  ov/[place] office/Annalina Needles re: GOLD 2 copd  maint on wixella 500  / resumed smoking / vaping and referred now by Dr Zollie re SPN on LDSCT (see below)  Chief Complaint  Patient presents with   Consult    [facility] on CT Lung cancer screening.  C/o SOB with exertion/walking from house to tool building in yard.  Dyspnea:  house to tool shed easier p albuterol  / does [employer] fine / overall though very sedentary   Cough: none  Sleeping:  sleeps horizontally on couch 2 pillows s resp cc  SABA use: avg 0 - 2 x per day  02: none  Rec Plan A = Automatic = Always=    breztri  Take 2 puffs first thing in am and then another 2 puffs about 12 hours later.  Work on inhaler technique:   Plan B = Backup (to supplement plan A, not to replace it) Only use your albuterol  inhaler as a rescue medication  Plan C = Crisis (instead of Plan B but only if Plan B stops working) - only use your albuterol  nebulizer if you first try Plan B    Please schedule a follow up office visit in 6 weeks, call sooner if needed with all medications /inhalers/ solutions in hand    12/21/2023  f/u ov/[place] office/[name] re: GOLD 2 copd/ SPN Pos PET RUL SPN  maint on Breztri   / still smoking  Chief Complaint  Patient presents with   Follow-up    Review PET scan results. Continues to smoke- 1 ppd.   Dyspnea:  house to shed is 50 yards flat walking  better/ shopping fine no HC parking  Cough: none  Sleeping: flat surface of couch x 2 pillows s  resp cc  SABA use:  only with shower  02: none     No obvious day to day or daytime variability or assoc excess/ purulent sputum or mucus plugs or hemoptysis or cp or chest tightness, subjective wheeze or overt sinus or hb symptoms.    Also denies any obvious fluctuation of symptoms with weather or environmental changes or other aggravating or alleviating factors except as outlined above   No  unusual exposure hx or h/o childhood pna/ asthma or knowledge of premature birth.  Current Allergies, Complete Past Medical History, Past Surgical History, Family History, and Social History were reviewed in [employer] record.  ROS  The following are not active complaints unless bolded Hoarseness, sore throat, dysphagia, dental problems, itching, sneezing,  nasal congestion or discharge of excess mucus or purulent secretions, ear ache,   fever, chills, sweats, unintended wt loss or wt gain, classically pleuritic or exertional cp,  orthopnea pnd or arm/hand swelling  or leg swelling, presyncope, palpitations, abdominal pain, anorexia, nausea, vomiting, diarrhea  or change in bowel habits or change in bladder  habits, change in stools or change in urine, dysuria, hematuria,  rash, arthralgias, visual complaints, headache, numbness, weakness or ataxia or problems with walking or coordination,  change in mood or  memory.        Current Meds  Medication Sig   albuterol  (VENTOLIN  HFA) 108 (90 Base) MCG/ACT inhaler Inhale 2 puffs into the lungs 2 (two) times daily.   allopurinol  (ZYLOPRIM ) 100 MG tablet TAKE 1 TABLET (100 MG TOTAL) BY MOUTH DAILY. TO PREVENT GOUT   amLODipine  (NORVASC ) 5 MG tablet TAKE 1 TABLET (5 MG TOTAL) BY MOUTH DAILY.   aspirin EC 81 MG tablet Take 81 mg by mouth daily. Swallow whole.   Budeson-Glycopyrrol-Formoterol  (BREZTRI  AEROSPHERE) 160-9-4.8 MCG/ACT AERO Take 2 puffs first thing in am and then another 2 puffs about 12 hours later.   colchicine  0.6 MG tablet TAKE TWICE DAILY FOR GOUT ATTACK. (MAY TAKE EVERY TWO HOURS UP TO 6 DOSES AT ACUTE ONSET)   Continuous Blood Gluc Sensor (FREESTYLE LIBRE 2 SENSOR) MISC USE TO TEST BLOOD SUGAR CONTINUOUSLY. Dx. E11.65 SEND TO [facility] FOR MEDICARE COVERAGE, DO NOT SEND TO LOCAL PHARMACY   ipratropium-albuterol  (DUONEB) 0.5-2.5 (3) MG/3ML SOLN Take 3 mLs by nebulization every 4 (four) hours as needed. (Patient  taking differently: Take 3 mLs by nebulization every 4 (four) hours as needed (SOB).)   losartan  (COZAAR ) 100 MG tablet Take 1 tablet (100 mg total) by mouth daily.   meloxicam  (MOBIC ) 15 MG tablet Take 1 tablet (15 mg total) by mouth daily.   metFORMIN  (GLUCOPHAGE -XR) 750 MG 24 hr tablet Take 1 tablet (750 mg total) by mouth daily with breakfast.   potassium chloride  SA (KLOR-CON  M20) 20 MEQ tablet TAKE 1 TABLET (20 MEQ TOTAL) DAILY BY MOUTH.   tamsulosin  (FLOMAX ) 0.4 MG CAPS capsule TAKE 2 CAPSULES BY MOUTH AT BEDTIME FOR URINE FLOW AND PROSTATE   torsemide  (DEMADEX ) 100 MG tablet Take 1 tablet (100 mg total) by mouth 2 (two) times daily. At arising and at lunch                Objective:   Physical Exam   Wts  12/21/2023            281   12/01/2023     279  05/01/2015       261    03/27/15 262 lb (118.842 kg)  03/26/15 249 lb 5.4 oz (113.1 kg)  03/23/15 246 lb (111.585 kg)    Vital signs reviewed  12/21/2023  - Note at rest 02 sats  95% on RA   General appearance:    MO (by BMI) amb wm nad  HEENT : Oropharynx  clear   Nasal turbinates nl    NECK :  without  apparent JVD/ palpable Nodes/TM    LUNGS: no acc muscle use,  Mild barrel  contour chest wall with bilateral  Distant bs s audible wheeze and  without cough on insp or exp maneuvers  and mild  Hyperresonant  to  percussion bilaterally     CV:  RRR  no s3 or murmur or increase in P2, and no edema   ABD:  obese soft and nontender with pos end  insp Hoover's  in the supine position.  No bruits or organomegaly appreciated   MS:  Nl gait/ ext warm without deformities Or obvious joint restrictions  calf tenderness, cyanosis or clubbing     SKIN: warm and dry without lesions    NEURO:  alert, approp, nl  sensorium with  no motor or cerebellar deficits apparent.              Assessment:

## 2023-12-21 ENCOUNTER — Encounter: Payer: Self-pay | Admitting: Internal Medicine

## 2023-12-21 ENCOUNTER — Ambulatory Visit: Payer: Medicare HMO | Admitting: Internal Medicine

## 2023-12-21 VITALS — BP 110/74 | HR 89 | Ht 70.0 in | Wt 281.0 lb

## 2023-12-21 DIAGNOSIS — J449 Chronic obstructive pulmonary disease, unspecified: Secondary | ICD-10-CM

## 2023-12-21 DIAGNOSIS — R911 Solitary pulmonary nodule: Secondary | ICD-10-CM

## 2023-12-21 DIAGNOSIS — F17210 Nicotine dependence, cigarettes, uncomplicated: Secondary | ICD-10-CM

## 2023-12-21 NOTE — Assessment & Plan Note (Signed)
 Counseled re importance of smoking cessation but did not meet time criteria for separate billing           Each maintenance medication was reviewed in detail including emphasizing most importantly the difference between maintenance and prns and under what circumstances the prns are to be triggered using an action plan format where appropriate.  Total time for H and P, chart review, counseling, reviewing hfa device(s) and generating customized AVS unique to this office visit / same day charting  > 40 min summary f/u

## 2023-12-21 NOTE — Assessment & Plan Note (Signed)
 Active smoker  - PFTs  05/01/2015  FEV1  2.50 (69%) ratio 69 p 11% improvement from saba  p am symbicort  160 and dlco 82/ erv 45 @ wt = 261 lb   - Chest LDSCT     11/03/23  Centrilobular emphsyema  - 12/01/2023  After extensive coaching inhaler device,  effectiveness =    75% from a baseline < 35%   >>> breztri  trial plus more approp saba use  - repeat PFTS 12/01/2023 >>> not done as of 12/21/2023  - 12/21/2023  After extensive coaching inhaler device,  effectiveness =  75% (unsteady insp effort) > continue breatri 2 bid  pending PFTs for 01/01/24   Group D (now reclassified as E) in terms of symptom/risk and laba/lama/ICS  therefore appropriate rx at this point >>>  continue breztri  2bid/ work on stopping smoking

## 2023-12-21 NOTE — Assessment & Plan Note (Signed)
 05/01/2015   erv 45% @ wt = 261 lb c/w effects of obesity   Body mass index is 40.32 kg/m.  -  trending up still  Lab Results  Component Value Date   TSH 0.452 03/30/2018      Contributing to doe and risk of GERD >>>   reviewed the need and the process to achieve and maintain neg calorie balance > defer f/u primary care including intermittently monitoring thyroid  status

## 2023-12-21 NOTE — Assessment & Plan Note (Signed)
 Active smoker - Chest LDSCT  11/03/23  Multi lobular pulmonary lesion identified in the medial right lung apex with volume derived equivalent diameter on the order of 3.2 cm  PET  12/07/23 > c/w ?  at least Stage 3 A > 12/21/2023 rec FOB/ bx   Discussed in detail all the  indications, usual  risks and alternatives  relative to the benefits with patient who agrees to proceed with w/u as outlined.     Agrees to direct referral for bx without consult 1st

## 2023-12-21 NOTE — Patient Instructions (Addendum)
 I will call to arrange lung bx as soon as possible   The key is to stop smoking completely before smoking completely stops you!  Kee appt for pfts and I'll call you the results.  Please schedule a follow up visit in 3 months but call sooner if needed

## 2023-12-22 ENCOUNTER — Telehealth: Payer: Self-pay | Admitting: Internal Medicine

## 2023-12-22 DIAGNOSIS — R911 Solitary pulmonary nodule: Secondary | ICD-10-CM

## 2023-12-22 NOTE — Telephone Encounter (Signed)
 Please advise on pts PET scan

## 2023-12-22 NOTE — Telephone Encounter (Signed)
 Patient is wondering if there is an update on the lung biopsy that was discussed at yesterday's visit---[phone]

## 2023-12-25 NOTE — Telephone Encounter (Signed)
 I have not attempted to contact this patient. Not sure who may have called them.

## 2023-12-25 NOTE — Telephone Encounter (Signed)
 PT's wife calling again for PET results and possible to sched biopsy. States she is ret ur call. Pls call her @ [phone]

## 2023-12-25 NOTE — Telephone Encounter (Signed)
 I have left message re PET and w/u pending per Dr Tonia Brooms

## 2023-12-25 NOTE — Telephone Encounter (Signed)
 I do not see where an order has been placed for the bronch - we will need an order to schedule.  Thanks!

## 2023-12-25 NOTE — Telephone Encounter (Signed)
 I had messaged Dr Tonia Brooms for his review of the case and approval for FOB but haven't heard back yet but fine with me to get the process going

## 2023-12-28 NOTE — Telephone Encounter (Signed)
 Pt calling to see when he is supposed to be sch for bronch

## 2023-12-29 NOTE — Telephone Encounter (Signed)
 Routing to Dr Tonia Brooms to place the bronch order

## 2024-01-01 ENCOUNTER — Institutional Professional Consult (permissible substitution): Payer: Medicare HMO | Admitting: Internal Medicine

## 2024-01-02 ENCOUNTER — Ambulatory Visit: Payer: Medicare HMO | Admitting: Family Medicine

## 2024-01-02 NOTE — Telephone Encounter (Signed)
 I will take care of it.

## 2024-01-03 ENCOUNTER — Telehealth: Payer: Self-pay | Admitting: Internal Medicine

## 2024-01-03 ENCOUNTER — Encounter: Payer: Self-pay | Admitting: Emergency Medicine

## 2024-01-03 NOTE — Telephone Encounter (Signed)
 PT's wife has questions about the CT Super D Chest that was requested. Pt's wife is a Engineer, civil (consulting) and believes that it isn't necessary due to having a PET scan done as well as a CT done recently.

## 2024-01-03 NOTE — Telephone Encounter (Signed)
 Order placed to schedule navigational bronchoscopy for right upper lobe nodule

## 2024-01-03 NOTE — Telephone Encounter (Signed)
 Spoke with Norma Champagne (spouse) regarding new appointment date and time for PFT at [facility] 02/08/24 2:00 pm---arrival time is 1:45 pm 1st floor registration desk.  She voiced her understanding

## 2024-01-03 NOTE — Telephone Encounter (Signed)
 PT is scheduled for Super D Chest CT 01/16/24 and Bronch on 01/22/2024.  Pt's wife is questioning why pt needs another CT.  Isa Manuel, can you advise on this?

## 2024-01-04 NOTE — Telephone Encounter (Signed)
Returned call. No answer. LVM to call office.

## 2024-01-05 ENCOUNTER — Other Ambulatory Visit: Payer: Self-pay | Admitting: Family Medicine

## 2024-01-05 DIAGNOSIS — I10 Essential (primary) hypertension: Secondary | ICD-10-CM

## 2024-01-08 NOTE — Telephone Encounter (Signed)
Attempted to reach patient and spouse again LVM.

## 2024-01-09 ENCOUNTER — Other Ambulatory Visit: Payer: Self-pay | Admitting: Family Medicine

## 2024-01-09 DIAGNOSIS — R609 Edema, unspecified: Secondary | ICD-10-CM

## 2024-01-09 DIAGNOSIS — E790 Hyperuricemia without signs of inflammatory arthritis and tophaceous disease: Secondary | ICD-10-CM

## 2024-01-09 NOTE — Telephone Encounter (Signed)
Spoke with wife. Explained Super D CT and how it will help guide th route for biopsy. No additional questions.

## 2024-01-11 ENCOUNTER — Encounter: Payer: Self-pay | Admitting: Family Medicine

## 2024-01-11 ENCOUNTER — Ambulatory Visit (INDEPENDENT_AMBULATORY_CARE_PROVIDER_SITE_OTHER): Payer: Medicare HMO | Admitting: Family Medicine

## 2024-01-11 VITALS — BP 109/71 | HR 78 | Temp 97.4°F | Ht 70.0 in | Wt 280.0 lb

## 2024-01-11 DIAGNOSIS — R911 Solitary pulmonary nodule: Secondary | ICD-10-CM

## 2024-01-11 DIAGNOSIS — I152 Hypertension secondary to endocrine disorders: Secondary | ICD-10-CM | POA: Diagnosis not present

## 2024-01-11 DIAGNOSIS — E782 Mixed hyperlipidemia: Secondary | ICD-10-CM

## 2024-01-11 DIAGNOSIS — J449 Chronic obstructive pulmonary disease, unspecified: Secondary | ICD-10-CM

## 2024-01-11 DIAGNOSIS — B179 Acute viral hepatitis, unspecified: Secondary | ICD-10-CM | POA: Diagnosis not present

## 2024-01-11 DIAGNOSIS — F17210 Nicotine dependence, cigarettes, uncomplicated: Secondary | ICD-10-CM

## 2024-01-11 DIAGNOSIS — R748 Abnormal levels of other serum enzymes: Secondary | ICD-10-CM

## 2024-01-11 DIAGNOSIS — E1165 Type 2 diabetes mellitus with hyperglycemia: Secondary | ICD-10-CM | POA: Diagnosis not present

## 2024-01-11 DIAGNOSIS — Z794 Long term (current) use of insulin: Secondary | ICD-10-CM

## 2024-01-11 DIAGNOSIS — E1159 Type 2 diabetes mellitus with other circulatory complications: Secondary | ICD-10-CM | POA: Diagnosis not present

## 2024-01-11 DIAGNOSIS — I10 Essential (primary) hypertension: Secondary | ICD-10-CM

## 2024-01-11 LAB — CMP14+EGFR
ALT: 117 [IU]/L — ABNORMAL HIGH (ref 0–44)
AST: 66 [IU]/L — ABNORMAL HIGH (ref 0–40)
Albumin: 4.1 g/dL (ref 3.9–4.9)
Alkaline Phosphatase: 64 [IU]/L (ref 44–121)
BUN/Creatinine Ratio: 20 (ref 10–24)
BUN: 24 mg/dL (ref 8–27)
Bilirubin Total: 0.5 mg/dL (ref 0.0–1.2)
CO2: 24 mmol/L (ref 20–29)
Calcium: 8.6 mg/dL (ref 8.6–10.2)
Chloride: 102 mmol/L (ref 96–106)
Creatinine, Ser: 1.18 mg/dL (ref 0.76–1.27)
Globulin, Total: 2.7 g/dL (ref 1.5–4.5)
Glucose: 109 mg/dL — ABNORMAL HIGH (ref 70–99)
Potassium: 3.9 mmol/L (ref 3.5–5.2)
Sodium: 142 mmol/L (ref 134–144)
Total Protein: 6.8 g/dL (ref 6.0–8.5)
eGFR: 66 mL/min/{1.73_m2} (ref >59)

## 2024-01-11 LAB — CBC WITH DIFFERENTIAL/PLATELET
Basophils Absolute: 0 10*3/uL (ref 0.0–0.2)
Basos: 1 %
EOS (ABSOLUTE): 0.2 10*3/uL (ref 0.0–0.4)
Eos: 4 %
Hematocrit: 40.1 % (ref 37.5–51.0)
Hemoglobin: 13.5 g/dL (ref 13.0–17.7)
Immature Grans (Abs): 0 10*3/uL (ref 0.0–0.1)
Immature Granulocytes: 0 %
Lymphocytes Absolute: 2.7 10*3/uL (ref 0.7–3.1)
Lymphs: 50 %
MCH: 32.2 pg (ref 26.6–33.0)
MCHC: 33.7 g/dL (ref 31.5–35.7)
MCV: 96 fL (ref 79–97)
Monocytes Absolute: 0.5 10*3/uL (ref 0.1–0.9)
Monocytes: 9 %
Neutrophils Absolute: 2 10*3/uL (ref 1.4–7.0)
Neutrophils: 36 %
Platelets: 176 10*3/uL (ref 150–450)
RBC: 4.19 x10E6/uL (ref 4.14–5.80)
RDW: 13.2 % (ref 11.6–15.4)
WBC: 5.4 10*3/uL (ref 3.4–10.8)

## 2024-01-11 LAB — LIPID PANEL
Chol/HDL Ratio: 3.4 {ratio} (ref 0.0–5.0)
Cholesterol, Total: 167 mg/dL (ref 100–199)
HDL: 49 mg/dL (ref >39)
LDL Chol Calc (NIH): 99 mg/dL (ref 0–99)
Triglycerides: 103 mg/dL (ref 0–149)
VLDL Cholesterol Cal: 19 mg/dL (ref 5–40)

## 2024-01-11 LAB — BAYER DCA HB A1C WAIVED: HB A1C (BAYER DCA - WAIVED): 5.8 % — ABNORMAL HIGH (ref 4.8–5.6)

## 2024-01-11 MED ORDER — NICOTINE 21 MG/24HR TD PT24
21.0000 mg | MEDICATED_PATCH | Freq: Every day | TRANSDERMAL | 2 refills | Status: DC
Start: 2024-01-11 — End: 2024-03-31

## 2024-01-11 MED ORDER — ACYCLOVIR 5 % EX CREA: 1.0000 | TOPICAL_CREAM | CUTANEOUS | 0 refills | Status: DC

## 2024-01-11 NOTE — Progress Notes (Signed)
Subjective:  Patient ID: Juan Merritt, male    DOB: Jun 12, 1951  Age: 73 y.o. MRN: [id number]  CC: Diabetes (Labs done) and Nail Problem (Toe nail came off. Big toe on left foot. No pain. )   HPI Juan Merritt presents forFollow-up of diabetes. Patient checks blood sugar at home.   83 to 111 (avg) fasting. Patient denies symptoms such as polyuria, polydipsia, excessive hunger, nausea No significant hypoglycemic spells noted. Medications reviewed. Pt reports taking them regularly without complication/adverse reaction being reported today.  COPD and lung nodule noted. Under care of Dr. Sherene Sires of pulmonary for both. Has upcoming surgery for removal of cancerous appearing RUL nodule in the next few weeks. PET reviewed with him. He is aware it looked cancerous and tat there are mets to lymph nodes. Prepared to have radiation as well.   History [name] has a past medical history of Arthritis, BPH associated with nocturia, Chronic gout, COPD (chronic obstructive pulmonary disease) (HCC), History of colon polyps, History of COVID-19, Hyperlipidemia, mixed, Hypertension, Phimosis, and Type 2 diabetes mellitus (HCC).   He has a past surgical history that includes Laparoscopic cholecystectomy (2008); Tonsillectomy; Colonoscopy (12/01/2021); and Circumcision (N/A, 05/19/2022).   His family history includes Asthma in his mother; Diabetes in his father; Esophageal cancer in his paternal uncle; Heart attack in an other family member; Heart attack (age of onset: 79) in his father; Leukemia in his brother; Lung disease in an other family member.He reports that he quit smoking about 6 years ago. His smoking use included cigarettes and e-cigarettes. He started smoking about 47 years ago. He has a 10 pack-year smoking history. His smokeless tobacco use includes chew. He reports current alcohol use. He reports that he does not use drugs.  Current Outpatient Medications on File Prior to Visit  Medication Sig  Dispense Refill   albuterol (VENTOLIN HFA) 108 (90 Base) MCG/ACT inhaler Inhale 2 puffs into the lungs 2 (two) times daily. 18 g 5   allopurinol (ZYLOPRIM) 100 MG tablet TAKE 1 TABLET (100 MG TOTAL) BY MOUTH DAILY. TO PREVENT GOUT 90 tablet 0   amLODipine (NORVASC) 5 MG tablet TAKE 1 TABLET (5 MG TOTAL) BY MOUTH DAILY. 90 tablet 0   aspirin EC 81 MG tablet Take 81 mg by mouth daily. Swallow whole.     Budeson-Glycopyrrol-Formoterol (BREZTRI AEROSPHERE) 160-9-4.8 MCG/ACT AERO Take 2 puffs first thing in am and then another 2 puffs about 12 hours later. 10.7 g 11   colchicine 0.6 MG tablet TAKE TWICE DAILY FOR GOUT ATTACK. (MAY TAKE EVERY TWO HOURS UP TO 6 DOSES AT ACUTE ONSET) 180 tablet 0   Continuous Blood Gluc Sensor (FREESTYLE LIBRE 2 SENSOR) MISC USE TO TEST BLOOD SUGAR CONTINUOUSLY. Dx. E11.65 SEND TO [facility] FOR MEDICARE COVERAGE, DO NOT SEND TO LOCAL PHARMACY 2 each 0   ipratropium-albuterol (DUONEB) 0.5-2.5 (3) MG/3ML SOLN Take 3 mLs by nebulization every 4 (four) hours as needed. (Patient taking differently: Take 3 mLs by nebulization every 4 (four) hours as needed (SOB).) 360 mL 3   losartan (COZAAR) 100 MG tablet Take 1 tablet (100 mg total) by mouth daily. 90 tablet 3   meloxicam (MOBIC) 15 MG tablet Take 1 tablet (15 mg total) by mouth daily. 90 tablet 3   metFORMIN (GLUCOPHAGE-XR) 750 MG 24 hr tablet Take 1 tablet (750 mg total) by mouth daily with breakfast. 90 tablet 3   potassium chloride SA (KLOR-CON M20) 20 MEQ tablet TAKE 1 TABLET (20 MEQ TOTAL)  DAILY BY MOUTH. 90 tablet 3   tamsulosin (FLOMAX) 0.4 MG CAPS capsule TAKE 2 CAPSULES BY MOUTH AT BEDTIME FOR URINE FLOW AND PROSTATE 180 capsule 2   torsemide (DEMADEX) 100 MG tablet TAKE 1 TABLET (100 MG TOTAL) BY MOUTH 2 (TWO) TIMES DAILY. AT ARISING AND AT LUNCH 180 tablet 0   No current facility-administered medications on file prior to visit.    ROS Review of Systems  Constitutional:  Negative for fever.  Respiratory:   Positive for shortness of breath.   Cardiovascular:  Negative for chest pain.  Musculoskeletal:  Negative for arthralgias.  Skin:  Negative for rash.    Objective:  BP 109/71   Pulse 78   Temp (!) 97.4 F (36.3 C)   Ht 5\' 10"  (1.778 m)   Wt 280 lb (127 kg)   SpO2 94%   BMI 40.18 kg/m   BP Readings from Last 3 Encounters:  01/11/24 109/71  12/21/23 110/74  12/01/23 109/69    Wt Readings from Last 3 Encounters:  01/11/24 280 lb (127 kg)  12/21/23 281 lb (127.5 kg)  12/01/23 279 lb 9.6 oz (126.8 kg)     Physical Exam Vitals reviewed.  Constitutional:      Appearance: He is well-developed.  HENT:     Head: Normocephalic and atraumatic.     Right Ear: External ear normal.     Left Ear: External ear normal.     Mouth/Throat:     Pharynx: No oropharyngeal exudate or posterior oropharyngeal erythema.  Eyes:     Pupils: Pupils are equal, round, and reactive to light.  Cardiovascular:     Rate and Rhythm: Normal rate and regular rhythm.     Heart sounds: No murmur heard. Pulmonary:     Effort: No respiratory distress.     Breath sounds: Normal breath sounds.  Musculoskeletal:     Cervical back: Normal range of motion and neck supple.  Neurological:     Mental Status: He is alert and oriented to person, place, and time.       Assessment & Plan:   [name] was seen today for diabetes and nail problem.  Diagnoses and all orders for this visit:  Type 2 diabetes mellitus with hyperglycemia, with long-term current use of insulin (HCC) -     Bayer DCA Hb A1c Waived -     Lipid panel  Elevated liver enzymes -     CMP14+EGFR  Essential hypertension, benign -     Bayer DCA Hb A1c Waived -     CBC with Differential/Platelet -     CMP14+EGFR -     Lipid panel  Hypertension associated with diabetes (HCC) -     CBC with Differential/Platelet  COPD mixed type (HCC) -     CBC with Differential/Platelet -     Lipid panel  Morbid obesity (HCC) -     Bayer DCA Hb  A1c Waived -     CBC with Differential/Platelet -     Lipid panel  Cigarette smoker  Mixed hyperlipidemia  Solitary pulmonary nodule on lung CT  Other orders -     nicotine (NICODERM CQ) 21 mg/24hr patch; Place 1 patch (21 mg total) onto the skin daily. -     acyclovir cream (ZOVIRAX) 5 %; Apply 1 Application topically every 3 (three) hours.      I am having Oluwaseyi Pasquariello. Arnone start on nicotine and acyclovir cream. I am also having him maintain his ipratropium-albuterol, aspirin EC,  FreeStyle Libre 2 Sensor, tamsulosin, meloxicam, albuterol, losartan, metFORMIN, potassium chloride SA, colchicine, Breztri Aerosphere, amLODipine, torsemide, and allopurinol.  Meds ordered this encounter  Medications   nicotine (NICODERM CQ) 21 mg/24hr patch    Sig: Place 1 patch (21 mg total) onto the skin daily.    Dispense:  28 patch    Refill:  2   acyclovir cream (ZOVIRAX) 5 %    Sig: Apply 1 Application topically every 3 (three) hours.    Dispense:  15 g    Refill:  0     Follow-up: Return in about 3 months (around 04/10/2024).  Mechele Claude, M.D.

## 2024-01-13 DIAGNOSIS — E1165 Type 2 diabetes mellitus with hyperglycemia: Secondary | ICD-10-CM | POA: Diagnosis not present

## 2024-01-16 ENCOUNTER — Ambulatory Visit (HOSPITAL_COMMUNITY)
Admission: RE | Admit: 2024-01-16 | Discharge: 2024-01-16 | Disposition: A | Discharge status: Home / Self Care | Payer: Medicare HMO | Source: Ambulatory Visit | Attending: Emergency Medicine | Admitting: Emergency Medicine

## 2024-01-16 DIAGNOSIS — R911 Solitary pulmonary nodule: Secondary | ICD-10-CM | POA: Diagnosis not present

## 2024-01-16 DIAGNOSIS — J439 Emphysema, unspecified: Secondary | ICD-10-CM | POA: Diagnosis not present

## 2024-01-16 DIAGNOSIS — R590 Localized enlarged lymph nodes: Secondary | ICD-10-CM | POA: Diagnosis not present

## 2024-01-17 ENCOUNTER — Encounter: Payer: Self-pay | Admitting: Family Medicine

## 2024-01-17 LAB — ACUTE VIRAL HEPATITIS (HAV, HBV, HCV)

## 2024-01-17 LAB — SPECIMEN STATUS REPORT

## 2024-01-17 LAB — HCV INTERPRETATION

## 2024-01-19 ENCOUNTER — Other Ambulatory Visit: Payer: Self-pay

## 2024-01-19 ENCOUNTER — Encounter (HOSPITAL_COMMUNITY): Payer: Self-pay | Admitting: Emergency Medicine

## 2024-01-19 NOTE — Progress Notes (Signed)
SDW call  Patient and his wife Nelva Bush were given pre-op instructions over the phone. They verbalized understanding of instructions provided.     PCP - Dr. Mechele Claude Cardiologist - Dr. Rollene Rotunda Pulmonary: Dr. Sandrea Hughs   PPM/ICD - denies Device Orders - na Rep Notified - na   Chest x-ray - 01/18/2023 EKG -  DOS, 01/22/2024 Stress Test -07/31/2020 ECHO - 07/02/2020 Cardiac Cath -   Sleep Study/sleep apnea/CPAP: denies  Type II diabetic.  CGM, Freestyle Libre to right arm Fasting Blood sugar range: 96-112 How often check sugars: continuous Metformin, instructed to hold DOS   Blood Thinner Instructions: denies Aspirin Instructions:last dose 01/19/2024   ERAS Protcol - NPO   Anesthesia review: No   Patient denies shortness of breath, fever, cough and chest pain over the phone call  Your procedure is scheduled on Monday January 22, 2024  Report to [facility] Main Entrance "A" at 0645  A.M., then check in with the Admitting office.  Call this number if you have problems the morning of surgery:  [phone]   If you have any questions prior to your surgery date call [phone]: Open Monday-Friday 8am-4pm If you experience any cold or flu symptoms such as cough, fever, chills, shortness of breath, etc. between now and your scheduled surgery, please notify us at the above number    Remember:  Do not eat or drink after midnight the night before your surgery  Take these medicines the morning of surgery with A SIP OF WATER:  Allopurinol, amlodipine, breztri  As needed: Albuterol, colchicine, duoneb  As of today, STOP taking any Aspirin (unless otherwise instructed by your surgeon) Aleve, Naproxen, Ibuprofen, Motrin, Advil, Goody's, BC's, all herbal medications, fish oil, and all vitamins.  This includes your Meloxicam

## 2024-01-21 NOTE — Anesthesia Preprocedure Evaluation (Signed)
Anesthesia Evaluation  Patient identified by MRN, date of birth, ID band Patient awake    Reviewed: Allergy & Precautions, NPO status , Patient's Chart, lab work & pertinent test results  Airway Mallampati: III  TM Distance: >3 FB Neck ROM: Full    Dental  (+) Dental Advisory Given, Edentulous Upper   Pulmonary COPD (used inhalers),  COPD inhaler, Current Smoker (10 pack years) and Patient abstained from smoking.   Pulmonary exam normal breath sounds clear to auscultation       Cardiovascular hypertension (142/83 preop), Pt. on medications Normal cardiovascular exam Rhythm:Regular Rate:Normal  TTE 2021  1. Left ventricular ejection fraction, by estimation, is 60 to 65%. The  left ventricle has normal function. The left ventricle has no regional  wall motion abnormalities. Left ventricular diastolic parameters are  indeterminate.   2. Right ventricular systolic function is normal. The right ventricular  size is normal.   3. Right atrial size was mildly dilated.   4. The mitral valve is normal in structure. Trivial mitral valve  regurgitation.   5. The aortic valve is tricuspid. Aortic valve regurgitation is not  visualized. Mild aortic valve sclerosis is present, with no evidence of  aortic valve stenosis.   6. The inferior vena cava is normal in size with greater than 50%  respiratory variability, suggesting right atrial pressure of 3 mmHg.   Stress Test 2021  The left ventricular ejection fraction is mildly decreased (45-54%).  Nuclear stress EF: 52%.  There was no ST segment deviation noted during stress.  This is a low risk study     Neuro/Psych negative neurological ROS  negative psych ROS   GI/Hepatic negative GI ROS, Neg liver ROS,,,  Endo/Other  diabetes, Well Controlled, Type 2, Oral Hypoglycemic Agents  Class 3 obesity (BMI 40)BMI 40  Renal/GU negative Renal ROS  negative genitourinary    Musculoskeletal  (+) Arthritis ,    Abdominal  (+) + obese  Peds  Hematology negative hematology ROS (+)   Anesthesia Other Findings   Reproductive/Obstetrics negative OB ROS                             Anesthesia Physical Anesthesia Plan  ASA: 3  Anesthesia Plan: General   Post-op Pain Management: Minimal or no pain anticipated   Induction: Intravenous  PONV Risk Score and Plan: 1 and Dexamethasone, Ondansetron and Treatment may vary due to age or medical condition  Airway Management Planned: Oral ETT  Additional Equipment: None  Intra-op Plan:   Post-operative Plan: Extubation in OR  Informed Consent: I have reviewed the patients History and Physical, chart, labs and discussed the procedure including the risks, benefits and alternatives for the proposed anesthesia with the patient or authorized representative who has indicated his/her understanding and acceptance.     Dental advisory given  Plan Discussed with: CRNA  Anesthesia Plan Comments:        Anesthesia Quick Evaluation

## 2024-01-22 ENCOUNTER — Ambulatory Visit (HOSPITAL_COMMUNITY): Payer: Self-pay | Admitting: Anesthesiology

## 2024-01-22 ENCOUNTER — Encounter (HOSPITAL_COMMUNITY)
Admission: RE | Disposition: A | Discharge status: Home / Self Care | Payer: Self-pay | Source: Ambulatory Visit | Attending: Emergency Medicine

## 2024-01-22 ENCOUNTER — Encounter (HOSPITAL_COMMUNITY): Payer: Self-pay | Admitting: Emergency Medicine

## 2024-01-22 ENCOUNTER — Ambulatory Visit (HOSPITAL_COMMUNITY): Payer: Medicare HMO

## 2024-01-22 ENCOUNTER — Ambulatory Visit (HOSPITAL_COMMUNITY)
Admission: RE | Admit: 2024-01-22 | Discharge: 2024-01-22 | Disposition: A | Discharge status: Home / Self Care | Payer: Medicare HMO | Source: Ambulatory Visit | Attending: Emergency Medicine | Admitting: Emergency Medicine

## 2024-01-22 ENCOUNTER — Other Ambulatory Visit: Payer: Self-pay

## 2024-01-22 ENCOUNTER — Ambulatory Visit (HOSPITAL_BASED_OUTPATIENT_CLINIC_OR_DEPARTMENT_OTHER): Payer: Self-pay | Admitting: Anesthesiology

## 2024-01-22 DIAGNOSIS — Z8249 Family history of ischemic heart disease and other diseases of the circulatory system: Secondary | ICD-10-CM | POA: Insufficient documentation

## 2024-01-22 DIAGNOSIS — R918 Other nonspecific abnormal finding of lung field: Secondary | ICD-10-CM

## 2024-01-22 DIAGNOSIS — C3411 Malignant neoplasm of upper lobe, right bronchus or lung: Secondary | ICD-10-CM | POA: Insufficient documentation

## 2024-01-22 DIAGNOSIS — E66813 Obesity, class 3: Secondary | ICD-10-CM | POA: Insufficient documentation

## 2024-01-22 DIAGNOSIS — M1990 Unspecified osteoarthritis, unspecified site: Secondary | ICD-10-CM | POA: Diagnosis not present

## 2024-01-22 DIAGNOSIS — I10 Essential (primary) hypertension: Secondary | ICD-10-CM | POA: Diagnosis not present

## 2024-01-22 DIAGNOSIS — J449 Chronic obstructive pulmonary disease, unspecified: Secondary | ICD-10-CM | POA: Diagnosis not present

## 2024-01-22 DIAGNOSIS — E119 Type 2 diabetes mellitus without complications: Secondary | ICD-10-CM | POA: Insufficient documentation

## 2024-01-22 DIAGNOSIS — C771 Secondary and unspecified malignant neoplasm of intrathoracic lymph nodes: Secondary | ICD-10-CM | POA: Insufficient documentation

## 2024-01-22 DIAGNOSIS — R609 Edema, unspecified: Secondary | ICD-10-CM

## 2024-01-22 DIAGNOSIS — R911 Solitary pulmonary nodule: Secondary | ICD-10-CM | POA: Diagnosis not present

## 2024-01-22 DIAGNOSIS — E782 Mixed hyperlipidemia: Secondary | ICD-10-CM

## 2024-01-22 DIAGNOSIS — R590 Localized enlarged lymph nodes: Secondary | ICD-10-CM | POA: Diagnosis not present

## 2024-01-22 DIAGNOSIS — Z6839 Body mass index (BMI) 39.0-39.9, adult: Secondary | ICD-10-CM | POA: Diagnosis not present

## 2024-01-22 DIAGNOSIS — Z7984 Long term (current) use of oral hypoglycemic drugs: Secondary | ICD-10-CM | POA: Diagnosis not present

## 2024-01-22 DIAGNOSIS — F17210 Nicotine dependence, cigarettes, uncomplicated: Secondary | ICD-10-CM | POA: Diagnosis not present

## 2024-01-22 DIAGNOSIS — Z79899 Other long term (current) drug therapy: Secondary | ICD-10-CM | POA: Insufficient documentation

## 2024-01-22 DIAGNOSIS — R846 Abnormal cytological findings in specimens from respiratory organs and thorax: Secondary | ICD-10-CM | POA: Diagnosis not present

## 2024-01-22 DIAGNOSIS — C801 Malignant (primary) neoplasm, unspecified: Secondary | ICD-10-CM | POA: Diagnosis not present

## 2024-01-22 DIAGNOSIS — Z833 Family history of diabetes mellitus: Secondary | ICD-10-CM | POA: Diagnosis not present

## 2024-01-22 DIAGNOSIS — Z9889 Other specified postprocedural states: Secondary | ICD-10-CM | POA: Diagnosis not present

## 2024-01-22 HISTORY — PX: BRONCHIAL BIOPSY: SHX5109

## 2024-01-22 HISTORY — PX: VIDEO BRONCHOSCOPY WITH RADIAL ENDOBRONCHIAL ULTRASOUND: SHX6849

## 2024-01-22 HISTORY — PX: FINE NEEDLE ASPIRATION: SHX5430

## 2024-01-22 HISTORY — PX: BRONCHIAL NEEDLE ASPIRATION BIOPSY: SHX5106

## 2024-01-22 HISTORY — PX: ENDOBRONCHIAL ULTRASOUND: SHX5096

## 2024-01-22 HISTORY — PX: BRONCHIAL BRUSHINGS: SHX5108

## 2024-01-22 LAB — BASIC METABOLIC PANEL
Anion gap: 11 (ref 5–15)
BUN: 15 mg/dL (ref 8–23)
CO2: 26 mmol/L (ref 22–32)
Calcium: 8.7 mg/dL — ABNORMAL LOW (ref 8.9–10.3)
Chloride: 104 mmol/L (ref 98–111)
Creatinine, Ser: 1.15 mg/dL (ref 0.61–1.24)
GFR, Estimated: >60 mL/min (ref >60)
Glucose, Bld: 115 mg/dL — ABNORMAL HIGH (ref 70–99)
Potassium: 3.7 mmol/L (ref 3.5–5.1)
Sodium: 141 mmol/L (ref 135–145)

## 2024-01-22 LAB — GLUCOSE, CAPILLARY: Glucose-Capillary: 141 mg/dL — ABNORMAL HIGH (ref 70–99)

## 2024-01-22 SURGERY — BRONCHOSCOPY, WITH BIOPSY USING ELECTROMAGNETIC NAVIGATION
Anesthesia: General | Laterality: Right

## 2024-01-22 MED ORDER — SUGAMMADEX SODIUM 200 MG/2ML IV SOLN
INTRAVENOUS | Status: DC | PRN
  Administered 2024-01-22: 400 mg via INTRAVENOUS

## 2024-01-22 MED ORDER — SODIUM CHLORIDE 0.9 % IV SOLN: INTRAVENOUS | Status: DC | PRN

## 2024-01-22 MED ORDER — ROCURONIUM BROMIDE 10 MG/ML (PF) SYRINGE
PREFILLED_SYRINGE | INTRAVENOUS | Status: DC | PRN
  Administered 2024-01-22: 70 mg via INTRAVENOUS
  Administered 2024-01-22 (×2): 10 mg via INTRAVENOUS

## 2024-01-22 MED ORDER — ALBUTEROL SULFATE HFA 108 (90 BASE) MCG/ACT IN AERS: 2.0000 | INHALATION_SPRAY | RESPIRATORY_TRACT | Status: DC | PRN

## 2024-01-22 MED ORDER — PROPOFOL 500 MG/50ML IV EMUL
INTRAVENOUS | Status: DC | PRN
  Administered 2024-01-22: 125 ug/kg/min via INTRAVENOUS

## 2024-01-22 MED ORDER — ONDANSETRON HCL 4 MG/2ML IJ SOLN
INTRAMUSCULAR | Status: DC | PRN
  Administered 2024-01-22: 4 mg via INTRAVENOUS

## 2024-01-22 MED ORDER — LIDOCAINE 2% (20 MG/ML) 5 ML SYRINGE
INTRAMUSCULAR | Status: DC | PRN
  Administered 2024-01-22: 60 mg via INTRAVENOUS

## 2024-01-22 MED ORDER — SODIUM CHLORIDE 0.9% FLUSH: 3.0000 mL | INTRAVENOUS | Status: DC | PRN

## 2024-01-22 MED ORDER — PROPOFOL 10 MG/ML IV BOLUS
INTRAVENOUS | Status: DC | PRN
  Administered 2024-01-22: 150 mg via INTRAVENOUS

## 2024-01-22 MED ORDER — CHLORHEXIDINE GLUCONATE 0.12 % MT SOLN
OROMUCOSAL | Status: AC
  Administered 2024-01-22: 15 mL
  Filled 2024-01-22: qty 15

## 2024-01-22 MED ORDER — TORSEMIDE 100 MG PO TABS: 100.0000 mg | ORAL_TABLET | Freq: Every day | ORAL | Status: DC

## 2024-01-22 MED ORDER — FENTANYL CITRATE (PF) 100 MCG/2ML IJ SOLN: 25.0000 ug | INTRAMUSCULAR | Status: DC | PRN

## 2024-01-22 MED ORDER — IPRATROPIUM-ALBUTEROL 0.5-2.5 (3) MG/3ML IN SOLN: 3.0000 mL | RESPIRATORY_TRACT | Status: DC | PRN

## 2024-01-22 MED ORDER — SODIUM CHLORIDE 0.9% FLUSH: 3.0000 mL | Freq: Two times a day (BID) | INTRAVENOUS | Status: DC

## 2024-01-22 MED ORDER — DEXAMETHASONE SODIUM PHOSPHATE 10 MG/ML IJ SOLN
INTRAMUSCULAR | Status: DC | PRN
  Administered 2024-01-22: 5 mg via INTRAVENOUS

## 2024-01-22 NOTE — Anesthesia Procedure Notes (Signed)
Procedure Name: Intubation Date/Time: 01/22/2024 9:25 AM  Performed by: Venia Carbon, CRNAPre-anesthesia Checklist: Patient identified, Emergency Drugs available, Suction available, Patient being monitored and Timeout performed Patient Re-evaluated:Patient Re-evaluated prior to induction Oxygen Delivery Method: Circle system utilized Preoxygenation: Pre-oxygenation with 100% oxygen Induction Type: IV induction Ventilation: Oral airway inserted - appropriate to patient size Laryngoscope Size: Mac and 3 Grade View: Grade II Tube type: Oral Tube size: 8.5 mm Number of attempts: 1 Airway Equipment and Method: Patient positioned with wedge pillow and Stylet Placement Confirmation: ETT inserted through vocal cords under direct vision, positive ETCO2, CO2 detector and breath sounds checked- equal and bilateral Secured at: 23 cm

## 2024-01-22 NOTE — Transfer of Care (Signed)
Immediate Anesthesia Transfer of Care Note  Patient: Juan Merritt  Procedure(s) Performed: ROBOTIC ASSISTED NAVIGATIONAL BRONCHOSCOPY (Right) ENDOBRONCHIAL ULTRASOUND BRONCHIAL NEEDLE ASPIRATION BIOPSIES BRONCHIAL BIOPSIES BRONCHIAL BRUSHINGS VIDEO BRONCHOSCOPY WITH RADIAL ENDOBRONCHIAL ULTRASOUND  Patient Location: PACU  Anesthesia Type:General  Level of Consciousness: awake, alert , oriented, and patient cooperative  Airway & Oxygen Therapy: Patient Spontanous Breathing and Patient connected to face mask  Post-op Assessment: Report given to RN, Post -op Vital signs reviewed and stable, Patient moving all extremities, Patient moving all extremities X 4, and Patient able to stick tongue midline  Post vital signs: Reviewed and stable  Last Vitals:  Vitals Value Taken Time  BP    Temp    Pulse 84 01/22/24 1044  Resp 17 01/22/24 1044  SpO2 96 % 01/22/24 1044  Vitals shown include unfiled device data.  Last Pain:  Vitals:   01/22/24 0724  PainSc: 0-No pain         Complications: No notable events documented.

## 2024-01-22 NOTE — H&P (Signed)
Juan Merritt is an 73 y.o. male.   Chief Complaint: right upper lobe nodule, mediastinal adenopathy HPI: 73 year old man with history of tobacco use and COPD followed by Dr. Sherene Sires in our office.  He has an irregular right upper lobe nodule, right paratracheal lymph node on chest imaging concerning for possible malignancy.  Recommendation made to achieve a tissue diagnosis via robotic assisted navigational bronchoscopy and endobronchial ultrasound.  Patient is on Breztri for the last month, states it is benefiting him.  Breathing has been stable.  Minimal albuterol use.  He understands the risk and benefits of the procedure.  Past Medical History:  Diagnosis Date   Arthritis    BPH associated with nocturia    Chronic gout    COPD (chronic obstructive pulmonary disease) (HCC)    not on home o2   History of colon polyps    History of COVID-19    04/ 2019 hospital admission in epic, severe sepsis due to covid w/ acute respiratory failure;   and 05/ 2021 mild to moderate symptoms, recovered at home   Hyperlipidemia, mixed    Hypertension    Phimosis    Type 2 diabetes mellitus (HCC)    followed by pcp   (05-17-2022  pt stated checks sugar multiple times daily w/ Libre,  fasting average --- 100-110)    Past Surgical History:  Procedure Laterality Date   CIRCUMCISION N/A 05/19/2022   Procedure: CIRCUMCISION ADULT;  Surgeon: Marcine Matar, MD;  Location: [facility];  Service: Urology;  Laterality: N/A;  45 MINUTES   COLONOSCOPY  12/01/2021   by dr Myrtie Neither   LAPAROSCOPIC CHOLECYSTECTOMY  2008   in [place]   TONSILLECTOMY     child    Family History  Problem Relation Age of Onset   Asthma Mother    Diabetes Father    Heart attack Father 18   Leukemia Brother    Esophageal cancer Paternal Uncle    Lung disease Other        pat cousin   Heart attack Other        pat cousin   Colon cancer Neg Hx    Stomach cancer Neg Hx    Social History:  reports that he has been  smoking cigarettes. He started smoking about 47 years ago. He has a 10 pack-year smoking history. His smokeless tobacco use includes chew. He reports current alcohol use of about 24.0 standard drinks of alcohol per week. He reports that he does not use drugs.  Allergies: No Known Allergies  Medications Prior to Admission  Medication Sig Dispense Refill   albuterol (VENTOLIN HFA) 108 (90 Base) MCG/ACT inhaler Inhale 2 puffs into the lungs 2 (two) times daily. (Patient taking differently: Inhale 2 puffs into the lungs every 4 (four) hours as needed for shortness of breath or wheezing.) 18 g 5   allopurinol (ZYLOPRIM) 100 MG tablet TAKE 1 TABLET (100 MG TOTAL) BY MOUTH DAILY. TO PREVENT GOUT 90 tablet 0   amLODipine (NORVASC) 5 MG tablet TAKE 1 TABLET (5 MG TOTAL) BY MOUTH DAILY. 90 tablet 0   Budeson-Glycopyrrol-Formoterol (BREZTRI AEROSPHERE) 160-9-4.8 MCG/ACT AERO Take 2 puffs first thing in am and then another 2 puffs about 12 hours later. 10.7 g 11   colchicine 0.6 MG tablet TAKE TWICE DAILY FOR GOUT ATTACK. (MAY TAKE EVERY TWO HOURS UP TO 6 DOSES AT ACUTE ONSET) 180 tablet 0   losartan (COZAAR) 100 MG tablet Take 1 tablet (100 mg total) by  mouth daily. 90 tablet 3   meloxicam (MOBIC) 15 MG tablet Take 1 tablet (15 mg total) by mouth daily. 90 tablet 3   metFORMIN (GLUCOPHAGE-XR) 750 MG 24 hr tablet Take 1 tablet (750 mg total) by mouth daily with breakfast. 90 tablet 3   nicotine (NICODERM CQ) 21 mg/24hr patch Place 1 patch (21 mg total) onto the skin daily. 28 patch 2   potassium chloride SA (KLOR-CON M20) 20 MEQ tablet TAKE 1 TABLET (20 MEQ TOTAL) DAILY BY MOUTH. 90 tablet 3   tamsulosin (FLOMAX) 0.4 MG CAPS capsule TAKE 2 CAPSULES BY MOUTH AT BEDTIME FOR URINE FLOW AND PROSTATE 180 capsule 2   torsemide (DEMADEX) 100 MG tablet TAKE 1 TABLET (100 MG TOTAL) BY MOUTH 2 (TWO) TIMES DAILY. AT ARISING AND AT LUNCH (Patient taking differently: Take 100 mg by mouth daily. At arising and at lunch) 180  tablet 0   acyclovir cream (ZOVIRAX) 5 % Apply 1 Application topically every 3 (three) hours. 15 g 0   aspirin EC 81 MG tablet Take 81 mg by mouth daily. Swallow whole.     Continuous Blood Gluc Sensor (FREESTYLE LIBRE 2 SENSOR) MISC USE TO TEST BLOOD SUGAR CONTINUOUSLY. Dx. E11.65 SEND TO [facility] FOR MEDICARE COVERAGE, DO NOT SEND TO LOCAL PHARMACY 2 each 0   ipratropium-albuterol (DUONEB) 0.5-2.5 (3) MG/3ML SOLN Take 3 mLs by nebulization every 4 (four) hours as needed. (Patient taking differently: Take 3 mLs by nebulization every 4 (four) hours as needed (SOB).) 360 mL 3    No results found for this or any previous visit (from the past 48 hours). No results found.  Review of Systems As per HPI Blood pressure (!) 142/83, pulse 85, temperature 98.6 F (37 C), resp. rate 18, height 5\' 10"  (1.778 m), weight 124.7 kg, SpO2 95%. Physical Exam  Gen: Pleasant, obese man, in no distress,  normal affect  ENT: No lesions,  mouth clear,  oropharynx clear, no postnasal drip  Neck: No JVD, no stridor  Lungs: No use of accessory muscles, no crackles or wheezing on normal respiration, no wheeze on forced expiration  Cardiovascular: RRR, heart sounds normal, no murmur or gallops, no peripheral edema  Abdomen: soft and NT, no HSM,  BS normal  Musculoskeletal: No deformities, no cyanosis or clubbing  Neuro: alert, awake, non focal  Skin: Warm, no lesions or rashes    Assessment/Plan Right upper lobe nodule, mediastinal adenopathy.  Plan for robotic assisted navigational bronchoscopy and endobronchial ultrasound to facilitate biopsies.  Patient understands the risks, benefits and agrees to proceed.  No barriers identified.  Leslye Peer, MD 01/22/2024, 7:44 AM

## 2024-01-22 NOTE — Anesthesia Postprocedure Evaluation (Signed)
Anesthesia Post Note  Patient: Melanie Pellot Petillo  Procedure(s) Performed: ROBOTIC ASSISTED NAVIGATIONAL BRONCHOSCOPY (Right) ENDOBRONCHIAL ULTRASOUND BRONCHIAL NEEDLE ASPIRATION BIOPSIES BRONCHIAL BIOPSIES BRONCHIAL BRUSHINGS VIDEO BRONCHOSCOPY WITH RADIAL ENDOBRONCHIAL ULTRASOUND FINE NEEDLE ASPIRATION (FNA) LINEAR     Patient location during evaluation: PACU Anesthesia Type: General Level of consciousness: awake and alert, oriented and patient cooperative Pain management: pain level controlled Vital Signs Assessment: post-procedure vital signs reviewed and stable Respiratory status: spontaneous breathing, nonlabored ventilation and respiratory function stable Cardiovascular status: blood pressure returned to baseline and stable Postop Assessment: no apparent nausea or vomiting Anesthetic complications: no   No notable events documented.  Last Vitals:  Vitals:   01/22/24 1100 01/22/24 1115  BP: 104/65 106/64  Pulse: 80 76  Resp: 18 19  Temp:  37.1 C  SpO2: 92% 94%    Last Pain:  Vitals:   01/22/24 1100  PainSc: 0-No pain                 Lannie Fields

## 2024-01-22 NOTE — Op Note (Signed)
Video Bronchoscopy with Robotic Assisted Bronchoscopic Navigation and Endobronchial Ultrasound Procedure Note  Date of Operation: 01/22/2024   Pre-op Diagnosis: Right upper lobe mass, mediastinal adenopathy  Post-op Diagnosis: Same  Surgeon: Levy Pupa  Assistants: None  Anesthesia: General endotracheal anesthesia  Operation: Flexible video fiberoptic bronchoscopy with robotic assistance and biopsies.  Estimated Blood Loss: Minimal  Complications: None  Indications and History: Juan Merritt is a 73 y.o. male with history of tobacco use.  Found to have an enlarging partially cavitary right upper lobe mass and a hypermetabolic mediastinal lymph node in the right paratracheal region recommendation made to achieve a tissue diagnosis via robotic assisted navigational bronchoscopy and endobronchial ultrasound. The risks, benefits, complications, treatment options and expected outcomes were discussed with the patient.  The possibilities of pneumothorax, pneumonia, reaction to medication, pulmonary aspiration, perforation of a viscus, bleeding, failure to diagnose a condition and creating a complication requiring transfusion or operation were discussed with the patient who freely signed the consent.    Description of Procedure: The patient was seen in the Preoperative Area, was examined and was deemed appropriate to proceed.  The patient was taken to [facility] endoscopy room 3, identified as Juan Merritt and the procedure verified as Flexible Video Fiberoptic Bronchoscopy.  A Time Out was held and the above information confirmed.   Prior to the date of the procedure a high-resolution CT scan of the chest was performed. Utilizing ION software program a virtual tracheobronchial tree was generated to allow the creation of distinct navigation pathways to the patient's parenchymal abnormalities. After being taken to the operating room general anesthesia was initiated and the patient  was orally  intubated. The video fiberoptic bronchoscope was introduced via the endotracheal tube and a general inspection was performed which showed normal right and left lung anatomy with the exception of narrowing of distal trachea just superior to the main carina.  There were no endobronchial lesions. Aspiration of the bilateral mainstems was completed to remove any remaining secretions. Robotic catheter inserted into patient's endotracheal tube.   Target #1 right upper lobe mass: The distinct navigation pathways prepared prior to this procedure were then utilized to navigate to patient's lesion identified on CT scan. The robotic catheter was secured into place and the vision probe was withdrawn.  Lesion location was approximated using fluoroscopy and radial endobronchial ultrasound for peripheral targeting.  Local registration and targeting was performed using Cios three-dimensional imaging.  Under fluoroscopic guidance transbronchial needle brushings, transbronchial needle biopsies, and transbronchial forceps biopsies were performed to be sent for cytology and pathology.  Needle in lesion was established using Cios. A single needle biopsy was performed and placed into sterile saline to go for routine, fungal and AFB cultures.  The robotic scope was then withdrawn and the endobronchial ultrasound was used to identify and characterize the peritracheal, hilar and bronchial lymph nodes. Inspection showed enlargement at station 4R.  There were no other abnormal nodes seen. Using real-time ultrasound guidance Wang needle biopsies were take from Station 4R nodes and were sent for cytology.   At the end of the procedure a general airway inspection was performed and there was no evidence of active bleeding. The bronchoscope was removed.  The patient tolerated the procedure well. There was no significant blood loss and there were no obvious complications. A post-procedural chest x-ray is pending.  Samples Target #1: 1.  Transbronchial needle brushings from right upper lobe mass 2. Transbronchial Wang needle biopsies from right upper lobe mass 3. Transbronchial  forceps biopsies from right upper lobe mass 4. Bronchoalveolar lavage from right upper lobe  EBUS samples: 1. Wang needle biopsies from 4R node  Plans:  The patient will be discharged from the PACU to home when recovered from anesthesia and after chest x-ray is reviewed. We will review the cytology, pathology and microbiology results with the patient when they become available. Outpatient followup will be with Saralyn Pilar, NP.    Levy Pupa, MD, PhD 01/22/2024, 10:38 AM [facility] [phone] or if no answer before 7:00PM call [phone] For any issues after 7:00PM please call eLink [phone]

## 2024-01-22 NOTE — Discharge Instructions (Addendum)
 Flexible Bronchoscopy, Care After This sheet gives you information about how to care for yourself after your test. Your doctor may also give you more specific instructions. If you have problems or questions, contact your doctor. Follow these instructions at home: Eating and drinking When your numbness is gone and your cough and gag reflexes have come back, you may: Eat only soft foods. Slowly drink liquids. When you get home after the test, go back to your normal diet. Driving Do not drive for 24 hours if you were given a medicine to help you relax (sedative). Do not drive or use heavy machinery while taking prescription pain medicine. General instructions  Take over-the-counter and prescription medicines only as told by your doctor. Return to your normal activities as told. Ask what activities are safe for you. Do not use any products that have nicotine or tobacco in them. This includes cigarettes and e-cigarettes. If you need help quitting, ask your doctor. Keep all follow-up visits as told by your doctor. This is important. It is very important if you had a tissue sample (biopsy) taken. Get help right away if: You have shortness of breath that gets worse. You get light-headed. You feel like you are going to pass out (faint). You have chest pain. You cough up: More than a little blood. More blood than before. Summary Do not eat or drink anything (not even water) for 2 hours after your test, or until your numbing medicine wears off. Do not use cigarettes. Do not use e-cigarettes. Get help right away if you have chest pain.  Please call our office for any questions or concerns.  [phone].  This information is not intended to replace advice given to you by your health care provider. Make sure you discuss any questions you have with your health care provider. Document Released: 10/02/2009 Document Revised: 11/17/2017 Document Reviewed: 12/23/2016 Elsevier Patient Education  2020  [employer].

## 2024-01-24 ENCOUNTER — Encounter: Payer: Self-pay | Admitting: Family Medicine

## 2024-01-24 ENCOUNTER — Telehealth: Payer: Self-pay

## 2024-01-24 LAB — FUNGAL STAIN REFLEX

## 2024-01-24 LAB — FUNGUS STAIN

## 2024-01-24 LAB — ACID FAST SMEAR (AFB, MYCOBACTERIA): Acid Fast Smear: NEGATIVE

## 2024-01-24 LAB — CYTOLOGY - NON PAP

## 2024-01-24 NOTE — Telephone Encounter (Signed)
 Copied from CRM [id number]. Topic: Clinical - Lab/Test Results >> Jan 24, 2024  9:00 AM Crispin Dolphin wrote: Reason for CRM: Patient wife returned call for lab results. Read note as written. Caller understood.

## 2024-01-25 ENCOUNTER — Encounter (HOSPITAL_COMMUNITY): Payer: Medicare HMO

## 2024-01-27 DIAGNOSIS — C3490 Malignant neoplasm of unspecified part of unspecified bronchus or lung: Secondary | ICD-10-CM | POA: Diagnosis not present

## 2024-01-27 LAB — AEROBIC/ANAEROBIC CULTURE W GRAM STAIN (SURGICAL/DEEP WOUND)
Culture: NO GROWTH
Gram Stain: NONE SEEN

## 2024-01-30 ENCOUNTER — Ambulatory Visit (INDEPENDENT_AMBULATORY_CARE_PROVIDER_SITE_OTHER): Payer: Medicare HMO | Admitting: Acute Care

## 2024-01-30 ENCOUNTER — Telehealth: Payer: Self-pay | Admitting: Radiation Oncology

## 2024-01-30 ENCOUNTER — Encounter: Payer: Self-pay | Admitting: Acute Care

## 2024-01-30 VITALS — BP 120/78 | HR 74 | Ht 70.0 in

## 2024-01-30 DIAGNOSIS — F17200 Nicotine dependence, unspecified, uncomplicated: Secondary | ICD-10-CM | POA: Diagnosis not present

## 2024-01-30 DIAGNOSIS — R918 Other nonspecific abnormal finding of lung field: Secondary | ICD-10-CM | POA: Diagnosis not present

## 2024-01-30 DIAGNOSIS — C3490 Malignant neoplasm of unspecified part of unspecified bronchus or lung: Secondary | ICD-10-CM | POA: Diagnosis not present

## 2024-01-30 NOTE — Progress Notes (Signed)
Thoracic Location of Tumor / Histology: Right Upper Lobe Lung,   Patient has been followed in the lung cancer screening program and was found to have a irregular right upper lobe pulmonary nodule.   MRI Brain: Unscheduled at this time.  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  Associated right paratracheal lymphadenopathy, measuring up to 1.6 cm short axis  Biopsies of Right Upper Lobe Lung Nodule 01/22/2024    Past/Anticipated interventions by pulmonary, if any:  Kandice Robinsons 01/30/2024 -I have referred you to medical oncology in [place], and Radiation oncology in [place] at [facility].  -I have also ordered an MRI of the Brain.    Past/Anticipated interventions by cardiothoracic surgery, if any:   Past/Anticipated interventions by medical oncology, if any:  Dr. Anders Simmonds 02/07/2024  Tobacco/Marijuana/Snuff/ETOH use:   Signs/Symptoms Weight changes, if any:  Respiratory complaints, if any:  Hemoptysis, if any:  Pain issues, if any:    SAFETY ISSUES: Prior radiation?  Pacemaker/ICD?   Possible current pregnancy? N/a Is the patient on methotrexate?   Current Complaints / other details:

## 2024-01-30 NOTE — Telephone Encounter (Signed)
Called pt to schedule urgent referral. Pt's wife was worried about scheduling before MedOnc consult is scheduled but eventually decided on scheduling RadOnc with next available date. She wanted to make sure pt would not immediately receive tx with this appt since they were not yet made aware of the severity of his cancer and have not had MedOnc consult. Message sent to PA Woodall about this.

## 2024-01-30 NOTE — Progress Notes (Signed)
History of Present Illness Juan Merritt is a 73 y.o. male with follow-up to his lung cancer screening program referred to [facility] pulmonary for abnormal lung cancer screening with an irregular right upper lobe pulmonary nodule.  Patient will be followed by Dr. Delton Coombes for his lung nodule and will continue to be followed by Dr. Work for his COPD.  Synopsis 73 year old man with history of tobacco use and COPD followed by Dr. Sherene Sires in our office. He has an irregular right upper lobe nodule, right paratracheal lymph node on chest imaging concerning for possible malignancy noted on lung cancer screening scan in November 2024.  Patient had PET scan as follow-up which showed the 4.1 cm medial right apical mass was hypermetabolic and compatible with a primary bronchogenic carcinoma.  Additionally there was notation of right paratracheal and right supraclavicular nodal metastasis.  There were no findings suspicious for metastatic disease in the abdomen or pelvis.  Recommendation made to achieve a tissue diagnosis via robotic assisted navigational bronchoscopy and endobronchial ultrasound. Patient is on Breztri for the last month, states it is benefiting him.  Patient underwent bronchoscopy with biopsies by Dr. Delton Coombes on 01/22/2024.  He is here today for follow-up to ensure he is done well postprocedure and to review his cytology results.   01/30/2024 Pt. Presents for follow up after bronchoscopy with biopsies. He states he has done well since the procedure.  He denies any bleeding, fever, purulent secretions, dyspnea, or adverse reaction to anesthesia.  He states he is back to his baseline.  While he has cut down on cigarette smoking he has not yet quit.  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 We discussed options for treatment and patient has been referred to medical  oncology at [facility] as that is close to the patient's address, additionally we have referred him to radiation oncology in [place] at [facility] as consults to determine best options for treatment and plan of care.  Patient is here today with his wife who is an Char[employer] fundraiser.  Neither had additional questions at completion of the visit.  I have made referrals as urgent to expedite patient care and management of his new diagnosis.  I have advised the patient to quit smoking completely.  I have provided him with smoking cessation tools to help and quit.  Please see after visit summary, I spent approximately 3 to 4 minutes discussing this with the patient today.  Test Results: Cytology 01/22/2024 FINAL MICROSCOPIC DIAGNOSIS:  A. LUNG, RUL, FINE NEEDLE ASPIRATION:  - Squamous cell carcinoma  - Aspergillus   B. LUNG, RUL, BRUSHING:  - Atypical cells present     D. LYMPH NODE, 4R, FINE NEEDLE ASPIRATION:  - Squamous cell carcinoma       - Background lymphoid tissue identified   Micro 01/22/2024 Negative for fungal Negative for AFB Aerobic Anaerobic Culture Negative for growth    Latest Ref Rng & Units 01/11/2024    8:00 AM 09/20/2023    8:25 AM 06/07/2023    7:59 AM  CBC  WBC 3.4 - 10.8 x10E3/uL 5.4  5.2  5.2   Hemoglobin 13.0 - 17.7 g/dL 16.1  09.6  04.5   Hematocrit 37.5 - 51.0 % 40.1  41.5  41.6   Platelets 150 - 450 x10E3/uL 176  188  178  Latest Ref Rng & Units 01/22/2024    7:49 AM 01/11/2024    8:00 AM 10/20/2023    8:42 AM  BMP  Glucose 70 - 99 mg/dL 604  540  981   BUN 8 - 23 mg/dL 15  24  20    Creatinine 0.61 - 1.24 mg/dL 1.91  4.78  2.95   BUN/Creat Ratio 10 - 24  20  16    Sodium 135 - 145 mmol/L 141  142  140   Potassium 3.5 - 5.1 mmol/L 3.7  3.9  3.9   Chloride 98 - 111 mmol/L 104  102  102   CO2 22 - 32 mmol/L 26  24  24    Calcium 8.9 - 10.3 mg/dL 8.7  8.6  8.8     BNP    Component Value Date/Time   BNP 73.0 11/27/2019 1026    ProBNP No  results found for: "PROBNP"  PFT    Component Value Date/Time   FEV1PRE 2.23 05/01/2015 1210   FEV1POST 2.50 05/01/2015 1210   FVCPRE 3.38 05/01/2015 1210   FVCPOST 3.62 05/01/2015 1210   TLC 7.15 05/01/2015 1210   DLCOUNC 27.39 05/01/2015 1210   PREFEV1FVCRT 66 05/01/2015 1210   PSTFEV1FVCRT 69 05/01/2015 1210    CT Super D Chest Wo Contrast Result Date: 01/24/2024 CLINICAL DATA:  Solitary pulmonary nodule on lung CT scan. Follow-up. * Tracking Code: BO * EXAM: CT CHEST WITHOUT CONTRAST TECHNIQUE: Multidetector CT imaging of the chest was performed using thin slice collimation for electromagnetic bronchoscopy planning purposes, without intravenous contrast. RADIATION DOSE REDUCTION: This exam was performed according to the departmental dose-optimization program which includes automated exposure control, adjustment of the mA and/or kV according to patient size and/or use of iterative reconstruction technique. COMPARISON:  Chest CT scan from 11/03/2023 and PET-CT scan from 12/07/2023. FINDINGS: Cardiovascular: Normal cardiac size. No pericardial effusion. No aortic aneurysm. There are coronary artery calcifications, in keeping with coronary artery disease. There are also mild peripheral atherosclerotic vascular calcifications of thoracic aorta and its major branches. Mediastinum/Nodes: Visualized thyroid gland appears grossly unremarkable. No solid / cystic mediastinal masses. The esophagus is nondistended precluding optimal assessment. Redemonstration of several enlarged mediastinal/mainly right lower paratracheal lymph nodes with largest measuring up to 1.7 x 1.9 cm, slightly increased since the prior study when it measured up 1.5 x 1.8 cm, when remeasured in similar fashion. These lymph nodes were FDG avid on the prior PET-CT scan and compatible with metastasis. No new mediastinal lymph node is seen by size criteria. No axillary lymphadenopathy by size criteria. Evaluation of bilateral hila is  limited due to lack on intravenous contrast: however, no large hilar lymphadenopathy identified. Lungs/Pleura: The central tracheo-bronchial tree is patent. Redemonstration of a lobulated predominantly solid 2.0 x 3.4 cm mass in the right lung apex, medially, which exhibits a new central cavitation since the prior chest CT scan but similar the prior PET-CT scan. The nodule appears slightly enlarged since the prior PET-CT scan when it measured up to 1.7 x 3.3 cm, when measured in similar fashion. No other new or suspicious lung nodule. There are mild upper lobe predominant emphysematous changes. No consolidation, pleural effusion or pneumothorax. Upper Abdomen: Visualized upper abdominal viscera within normal limits. Musculoskeletal: The visualized soft tissues of the chest wall are grossly unremarkable. No suspicious osseous lesions. There are mild multilevel degenerative changes in the visualized spine. IMPRESSION: 1. Redemonstration of patient's known lobulated solid mass in the right lung apex with small central cavitation, slightly  increased since the prior PET-CT scan. 2. Redemonstration of several enlarged metastatic mediastinal lymph nodes which also exhibits minimal interval increase in size compared to the prior chest CT scan. No new mediastinal lymphadenopathy by size criteria. 3. Multiple other nonacute observations, as described above. Aortic Atherosclerosis (ICD10-I70.0) and Emphysema (ICD10-J43.9). Electronically Signed   By: Jules Schick M.D.   On: 01/24/2024 09:32   DG Chest Port 1 View Result Date: 01/22/2024 CLINICAL DATA:  Postop from bronchoscopic biopsy of right lung mass. EXAM: PORTABLE CHEST 1 VIEW COMPARISON:  01/18/2023 FINDINGS: The heart size and mediastinal contours are within normal limits. Linear opacity in left lung base may be due to atelectasis or scarring. No focal consolidation or pleural effusion. The visualized skeletal structures are unremarkable. IMPRESSION: No pneumothorax  visualized. Mild left basilar atelectasis or scarring. Electronically Signed   By: Danae Orleans M.D.   On: 01/22/2024 11:17   DG C-ARM BRONCHOSCOPY Result Date: 01/22/2024 C-ARM BRONCHOSCOPY: Fluoroscopy was utilized by the requesting physician.  No radiographic interpretation.     Past medical hx Past Medical History:  Diagnosis Date   Arthritis    BPH associated with nocturia    Chronic gout    COPD (chronic obstructive pulmonary disease) (HCC)    not on home o2   History of colon polyps    History of COVID-19    04/ 2019 hospital admission in epic, severe sepsis due to covid w/ acute respiratory failure;   and 05/ 2021 mild to moderate symptoms, recovered at home   Hyperlipidemia, mixed    Hypertension    Phimosis    Type 2 diabetes mellitus (HCC)    followed by pcp   (05-17-2022  pt stated checks sugar multiple times daily w/ Libre,  fasting average --- 100-110)     Social History   Tobacco Use   Smoking status: Every Day    Current packs/day: 0.00    Average packs/day: 0.3 packs/day for 40.0 years (10.0 ttl pk-yrs)    Types: Cigarettes    Start date: 01/15/1977    Last attempt to quit: 01/15/2017    Years since quitting: 7.0   Smokeless tobacco: Current    Types: Chew   Tobacco comments:    Smokes half to one pack per day for about 45 years - now also smokes e-cigarette sometimes and chews tobacco.  Updated 12/01/2023.  Vaping Use   Vaping status: Former   Substances: Nicotine   Devices: unsure  Substance Use Topics   Alcohol use: Yes    Alcohol/week: 24.0 standard drinks of alcohol    Types: 24 Cans of beer per week   Drug use: Never    Mr.Mengel reports that he has been smoking cigarettes. He started smoking about 47 years ago. He has a 10 pack-year smoking history. His smokeless tobacco use includes chew. He reports current alcohol use of about 24.0 standard drinks of alcohol per week. He reports that he does not use drugs.  Tobacco Cessation: Ready to  quit: Not Answered Counseling given: Not Answered Tobacco comments: Smokes half to one pack per day for about 45 years - now also smokes e-cigarette sometimes and chews tobacco.  Updated 12/01/2023. Patient has been encouraged to quit smoking completely.  He states he is currently smoking 6 to 7 cigarettes a day. I have provided smoking cessation tools as well as 3 to 4 minutes of smoking cessation counseling today in the office.  Past surgical hx, Family hx, Social hx all reviewed.  Current Outpatient Medications on File Prior to Visit  Medication Sig   albuterol (VENTOLIN HFA) 108 (90 Base) MCG/ACT inhaler Inhale 2 puffs into the lungs every 4 (four) hours as needed for shortness of breath or wheezing.   allopurinol (ZYLOPRIM) 100 MG tablet TAKE 1 TABLET (100 MG TOTAL) BY MOUTH DAILY. TO PREVENT GOUT   amLODipine (NORVASC) 5 MG tablet TAKE 1 TABLET (5 MG TOTAL) BY MOUTH DAILY.   aspirin EC 81 MG tablet Take 81 mg by mouth daily. Swallow whole.   Budeson-Glycopyrrol-Formoterol (BREZTRI AEROSPHERE) 160-9-4.8 MCG/ACT AERO Take 2 puffs first thing in am and then another 2 puffs about 12 hours later.   colchicine 0.6 MG tablet TAKE TWICE DAILY FOR GOUT ATTACK. (MAY TAKE EVERY TWO HOURS UP TO 6 DOSES AT ACUTE ONSET)   Continuous Blood Gluc Sensor (FREESTYLE LIBRE 2 SENSOR) MISC USE TO TEST BLOOD SUGAR CONTINUOUSLY. Dx. E11.65 SEND TO [facility] FOR MEDICARE COVERAGE, DO NOT SEND TO LOCAL PHARMACY   ipratropium-albuterol (DUONEB) 0.5-2.5 (3) MG/3ML SOLN Take 3 mLs by nebulization every 4 (four) hours as needed (SOB).   losartan (COZAAR) 100 MG tablet Take 1 tablet (100 mg total) by mouth daily.   meloxicam (MOBIC) 15 MG tablet Take 1 tablet (15 mg total) by mouth daily.   metFORMIN (GLUCOPHAGE-XR) 750 MG 24 hr tablet Take 1 tablet (750 mg total) by mouth daily with breakfast.   nicotine (NICODERM CQ) 21 mg/24hr patch Place 1 patch (21 mg total) onto the skin daily.   potassium chloride SA  (KLOR-CON M20) 20 MEQ tablet TAKE 1 TABLET (20 MEQ TOTAL) DAILY BY MOUTH.   tamsulosin (FLOMAX) 0.4 MG CAPS capsule TAKE 2 CAPSULES BY MOUTH AT BEDTIME FOR URINE FLOW AND PROSTATE   torsemide (DEMADEX) 100 MG tablet Take 1 tablet (100 mg total) by mouth daily. At arising and at lunch   acyclovir cream (ZOVIRAX) 5 % Apply 1 Application topically every 3 (three) hours. (Patient not taking: Reported on 01/30/2024)   No current facility-administered medications on file prior to visit.     No Known Allergies  Review Of Systems:  Constitutional:   No  weight loss, night sweats,  Fevers, chills, fatigue, or  lassitude.  HEENT:   No headaches,  Difficulty swallowing,  Tooth/dental problems, or  Sore throat,                No sneezing, itching, ear ache, nasal congestion, post nasal drip,   CV:  No chest pain,  Orthopnea, PND, swelling in lower extremities, anasarca, dizziness, palpitations, syncope.   GI  No heartburn, indigestion, abdominal pain, nausea, vomiting, diarrhea, change in bowel habits, loss of appetite, bloody stools.   Resp: Positive shortness of breath with exertion less at rest.  Baseline  excess mucus, positive productive cough,  No non-productive cough,  No coughing up of blood.  No change in color of mucus.  No wheezing.  No chest wall deformity  Skin: no rash or lesions.  GU: no dysuria, change in color of urine, no urgency or frequency.  No flank pain, no hematuria   MS:  No joint pain or swelling.  No decreased range of motion.  No back pain.  Psych:  No change in mood or affect. No depression or anxiety.  No memory loss.   Vital Signs BP 120/78   Pulse 74   Ht 5\' 10"  (1.778 m)   SpO2 98% Comment: room air  BMI 39.46 kg/m    Physical Exam:  General- No distress,  A&Ox3, pleasant and hard of hearing ENT: No sinus tenderness, TM clear, pale nasal mucosa, no oral exudate,no post nasal drip, no LAN Cardiac: S1, S2, regular rate and rhythm, no murmur Chest: Air  movement noted bilaterally, no wheeze/ rales/ dullness; no accessory muscle use, no nasal flaring, no sternal retractions, slightly diminished per bases Abd.: Soft Non-tender, nondistended, bowel sounds positive,Body mass index is 39.46 kg/m.  Ext: No clubbing cyanosis, edema, no obvious deformities Neuro:  normal strength, moving all extremities x 4, alert and oriented x 3, hard of hearing, anxious however this is appropriate Skin: No rashes, warm and dry, no obvious lesions Psych: normal mood and behavior, appropriate   Assessment/Plan New diagnosis squamous cell lung cancer with metastatic disease to 4R lymph node Current everyday smoker Plan I am glad you have done well after your procedure. Your biopsy was positive for squamous cell lung cancer in the Right upper lobe and 4R lymph node.  I have referred you to medical oncology in [place], and Radiation oncology in [place] at [facility]. You will get calls to get these appointments scheduled. I have also ordered an MRI of the Brain. You will get a call to get this scheduled. If either of the oncologists feel surgery is an option, they will refer you to surgery to be evaluated.  Please work on quitting smoking.  You can receive free nicotine replacement therapy (patches, gum, or mints) by calling 1-800-QUIT NOW. Please call so we can get you on the path to becoming a non-smoker. I know it is hard, but you can do this!  Hypnosis for smoking cessation  [employer] [phone]  Acupuncture for smoking cessation  [employer] [phone]   Please contact office for sooner follow up if symptoms do not improve or worsen or seek emergency care     I spent 35 minutes dedicated to the care of this patient on the date of this encounter to include pre-visit review of records, face-to-face time with the patient discussing conditions above, post visit ordering of testing, clinical documentation with the  electronic health record, making appropriate referrals as documented, and communicating necessary information to the patient's healthcare team.      Bevelyn Ngo, NP 01/30/2024  11:06 AM

## 2024-01-30 NOTE — Patient Instructions (Addendum)
It is good to see you today. I am glad you have done well after your procedure. Your biopsy was positive for squamous cell lung cancer in the Right upper lobe and 4R lymph node.  I have referred you to medical oncology in [place], and Radiation oncology in [place] at [facility]. You will get calls to get these appointments scheduled. I have also ordered an MRI of the Brain. You will get a call to get this scheduled. If either of the oncologists feel surgery is an option, they will refer you to surgery to be evaluated.  Please work on quitting smoking.  You can receive free nicotine replacement therapy (patches, gum, or mints) by calling 1-800-QUIT NOW. Please call so we can get you on the path to becoming a non-smoker. I know it is hard, but you can do this!  Hypnosis for smoking cessation  [employer] [phone]  Acupuncture for smoking cessation  [employer] [phone]   Please contact office for sooner follow up if symptoms do not improve or worsen or seek emergency care

## 2024-01-31 ENCOUNTER — Ambulatory Visit
Admission: RE | Admit: 2024-01-31 | Discharge: 2024-01-31 | Disposition: A | Discharge status: Home / Self Care | Payer: Medicare HMO | Source: Ambulatory Visit | Attending: Radiation Oncology | Admitting: Radiation Oncology

## 2024-01-31 ENCOUNTER — Telehealth: Payer: Self-pay | Admitting: Radiation Oncology

## 2024-01-31 ENCOUNTER — Encounter: Payer: Self-pay | Admitting: Radiation Oncology

## 2024-01-31 VITALS — BP 123/55 | HR 85 | Temp 98.1°F | Resp 18 | Ht 70.0 in | Wt 278.4 lb

## 2024-01-31 DIAGNOSIS — E785 Hyperlipidemia, unspecified: Secondary | ICD-10-CM | POA: Diagnosis not present

## 2024-01-31 DIAGNOSIS — Z860101 Personal history of adenomatous and serrated colon polyps: Secondary | ICD-10-CM | POA: Diagnosis not present

## 2024-01-31 DIAGNOSIS — C3411 Malignant neoplasm of upper lobe, right bronchus or lung: Secondary | ICD-10-CM

## 2024-01-31 DIAGNOSIS — J439 Emphysema, unspecified: Secondary | ICD-10-CM | POA: Diagnosis not present

## 2024-01-31 DIAGNOSIS — Z806 Family history of leukemia: Secondary | ICD-10-CM | POA: Diagnosis not present

## 2024-01-31 DIAGNOSIS — Z7984 Long term (current) use of oral hypoglycemic drugs: Secondary | ICD-10-CM | POA: Diagnosis not present

## 2024-01-31 DIAGNOSIS — I10 Essential (primary) hypertension: Secondary | ICD-10-CM | POA: Diagnosis not present

## 2024-01-31 DIAGNOSIS — Z791 Long term (current) use of non-steroidal anti-inflammatories (NSAID): Secondary | ICD-10-CM | POA: Diagnosis not present

## 2024-01-31 DIAGNOSIS — I700 Atherosclerosis of aorta: Secondary | ICD-10-CM | POA: Diagnosis not present

## 2024-01-31 DIAGNOSIS — Z801 Family history of malignant neoplasm of trachea, bronchus and lung: Secondary | ICD-10-CM | POA: Insufficient documentation

## 2024-01-31 DIAGNOSIS — E119 Type 2 diabetes mellitus without complications: Secondary | ICD-10-CM | POA: Diagnosis not present

## 2024-01-31 DIAGNOSIS — M129 Arthropathy, unspecified: Secondary | ICD-10-CM | POA: Diagnosis not present

## 2024-01-31 DIAGNOSIS — I2510 Atherosclerotic heart disease of native coronary artery without angina pectoris: Secondary | ICD-10-CM | POA: Insufficient documentation

## 2024-01-31 DIAGNOSIS — F17210 Nicotine dependence, cigarettes, uncomplicated: Secondary | ICD-10-CM | POA: Diagnosis not present

## 2024-01-31 DIAGNOSIS — Z7982 Long term (current) use of aspirin: Secondary | ICD-10-CM | POA: Diagnosis not present

## 2024-01-31 DIAGNOSIS — Z8616 Personal history of COVID-19: Secondary | ICD-10-CM | POA: Diagnosis not present

## 2024-01-31 DIAGNOSIS — Z79899 Other long term (current) drug therapy: Secondary | ICD-10-CM | POA: Insufficient documentation

## 2024-01-31 DIAGNOSIS — J449 Chronic obstructive pulmonary disease, unspecified: Secondary | ICD-10-CM | POA: Insufficient documentation

## 2024-01-31 NOTE — Telephone Encounter (Signed)
Sent urgent referral to Dr. Langston Masker at [facility] Radiation oncology via fax 01/31/24.

## 2024-01-31 NOTE — Progress Notes (Signed)
Radiation Oncology         ([phone]) [phone] ________________________________  Name: Juan Merritt        MRN: [id number]  Date of Service: 01/31/2024 DOB: 1951-12-03  WU:[id number], Broadus John, MD  Leslye Peer, MD     REFERRING PHYSICIAN: Leslye Peer, MD   DIAGNOSIS: The encounter diagnosis was Malignant neoplasm of upper lobe of right lung [facility]).   HISTORY OF PRESENT ILLNESS: Juan Merritt is a 73 y.o. male seen at the request of Dr. Delton Coombes for a newly diagnosed lung cancer. The patient presented with Followed in the pulmonary clinic at [facility], who was sent for lung cancer screening CT.  This was performed on 11/03/2023 and showed an L RADS 4B suspicious appearing nodule in the right upper lobe measuring approximately 3.2 cm.  Mildly enlarged low right paratracheal lymphadenopathy and additional upper limit of normal mediastinum lymph nodes were noted.  It appeared that he had an inflammatory cystic appearing change in the left anterior chest along the left sternal border.  A PET scan on 12/07/2023 showed hypermetabolic activity within the right apical mass with an SUV of 9.3, and the mass measuring 4.1 cm.  His right paratracheal lymph node measured 1.6 cm with an SUV of 8.8, and a right supraclavicular node was notable at 8 mm with an SUV of 7.1.  No other suspicious findings were appreciated and the patient underwent a bronchoscopy on 01/22/2024.  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  Given this finding he has been referred to discuss treatment and is scheduled for an MRI of the brain on 02/04/2024.    PREVIOUS RADIATION THERAPY: No   PAST MEDICAL HISTORY:  Past Medical History:  Diagnosis Date   Arthritis    BPH associated with nocturia    Chronic gout    COPD (chronic obstructive pulmonary disease) (HCC)    not  on home o2   History of colon polyps    History of COVID-19    04/ 2019 hospital admission in epic, severe sepsis due to covid w/ acute respiratory failure;   and 05/ 2021 mild to moderate symptoms, recovered at home   Hyperlipidemia, mixed    Hypertension    Phimosis    Type 2 diabetes mellitus (HCC)    followed by pcp   (05-17-2022  pt stated checks sugar multiple times daily w/ Libre,  fasting average --- 100-110)       PAST SURGICAL HISTORY: Past Surgical History:  Procedure Laterality Date   BRONCHIAL BIOPSY  01/22/2024   Procedure: BRONCHIAL BIOPSIES;  Surgeon: Leslye Peer, MD;  Location: [facility] ENDOSCOPY;  Service: Pulmonary;;   BRONCHIAL BRUSHINGS  01/22/2024   Procedure: BRONCHIAL BRUSHINGS;  Surgeon: Leslye Peer, MD;  Location: [facility] ENDOSCOPY;  Service: Pulmonary;;   BRONCHIAL NEEDLE ASPIRATION BIOPSY  01/22/2024   Procedure: BRONCHIAL NEEDLE ASPIRATION BIOPSIES;  Surgeon: Leslye Peer, MD;  Location: [facility] ENDOSCOPY;  Service: Pulmonary;;   CIRCUMCISION N/A 05/19/2022   Procedure: CIRCUMCISION ADULT;  Surgeon: Marcine Matar, MD;  Location: [facility];  Service: Urology;  Laterality: N/A;  45 MINUTES   COLONOSCOPY  12/01/2021   by dr Myrtie Neither   ENDOBRONCHIAL ULTRASOUND  01/22/2024   Procedure: ENDOBRONCHIAL ULTRASOUND;  Surgeon: Leslye Peer, MD;  Location: [facility] ENDOSCOPY;  Service: Pulmonary;;   FINE NEEDLE ASPIRATION  01/22/2024   Procedure: FINE NEEDLE ASPIRATION (FNA) LINEAR;  Surgeon: Leslye Peer, MD;  Location: [facility] ENDOSCOPY;  Service: Pulmonary;;   LAPAROSCOPIC CHOLECYSTECTOMY  2008   in [place]   TONSILLECTOMY     child   VIDEO BRONCHOSCOPY WITH RADIAL ENDOBRONCHIAL ULTRASOUND  01/22/2024   Procedure: VIDEO BRONCHOSCOPY WITH RADIAL ENDOBRONCHIAL ULTRASOUND;  Surgeon: Leslye Peer, MD;  Location: [facility] ENDOSCOPY;  Service: Pulmonary;;     FAMILY HISTORY:  Family History  Problem Relation Age of Onset   Asthma Mother    Diabetes Father    Heart attack  Father 72   Leukemia Brother    Esophageal cancer Paternal Uncle    Lung disease Other        pat cousin   Heart attack Other        pat cousin   Colon cancer Neg Hx    Stomach cancer Neg Hx      SOCIAL HISTORY:  reports that he has been smoking cigarettes. He started smoking about 47 years ago. He has a 10 pack-year smoking history. His smokeless tobacco use includes chew. He reports current alcohol use of about 24.0 standard drinks of alcohol per week. He reports that he does not use drugs. The patient is married and lives in [place]. He is a retired Naval architect. His wife who accompanies him is a long term care nurse.    ALLERGIES: Patient has no known allergies.   MEDICATIONS:  Current Outpatient Medications  Medication Sig Dispense Refill   albuterol (VENTOLIN HFA) 108 (90 Base) MCG/ACT inhaler Inhale 2 puffs into the lungs every 4 (four) hours as needed for shortness of breath or wheezing.     allopurinol (ZYLOPRIM) 100 MG tablet TAKE 1 TABLET (100 MG TOTAL) BY MOUTH DAILY. TO PREVENT GOUT 90 tablet 0   amLODipine (NORVASC) 5 MG tablet TAKE 1 TABLET (5 MG TOTAL) BY MOUTH DAILY. 90 tablet 0   aspirin EC 81 MG tablet Take 81 mg by mouth daily. Swallow whole.     Budeson-Glycopyrrol-Formoterol (BREZTRI AEROSPHERE) 160-9-4.8 MCG/ACT AERO Take 2 puffs first thing in am and then another 2 puffs about 12 hours later. 10.7 g 11   colchicine 0.6 MG tablet TAKE TWICE DAILY FOR GOUT ATTACK. (MAY TAKE EVERY TWO HOURS UP TO 6 DOSES AT ACUTE ONSET) 180 tablet 0   Continuous Blood Gluc Sensor (FREESTYLE LIBRE 2 SENSOR) MISC USE TO TEST BLOOD SUGAR CONTINUOUSLY. Dx. E11.65 SEND TO [facility] FOR MEDICARE COVERAGE, DO NOT SEND TO LOCAL PHARMACY 2 each 0   ipratropium-albuterol (DUONEB) 0.5-2.5 (3) MG/3ML SOLN Take 3 mLs by nebulization every 4 (four) hours as needed (SOB).     losartan (COZAAR) 100 MG tablet Take 1 tablet (100 mg total) by mouth daily. 90 tablet 3   meloxicam (MOBIC) 15 MG  tablet Take 1 tablet (15 mg total) by mouth daily. 90 tablet 3   metFORMIN (GLUCOPHAGE-XR) 750 MG 24 hr tablet Take 1 tablet (750 mg total) by mouth daily with breakfast. 90 tablet 3   nicotine (NICODERM CQ) 21 mg/24hr patch Place 1 patch (21 mg total) onto the skin daily. 28 patch 2   potassium chloride SA (KLOR-CON M20) 20 MEQ tablet TAKE 1 TABLET (20 MEQ TOTAL) DAILY BY MOUTH. 90 tablet 3   tamsulosin (FLOMAX) 0.4 MG CAPS capsule TAKE 2 CAPSULES BY MOUTH AT BEDTIME FOR URINE FLOW AND PROSTATE 180 capsule 2   torsemide (DEMADEX) 100 MG tablet Take 1 tablet (100 mg total)  by mouth daily. At arising and at lunch     No current facility-administered medications for this encounter.     REVIEW OF SYSTEMS: On review of systems, the patient reports that he is doing okay. He is smoking about half a pack of cigarettes but has just made this change recently.  He does have shortness of breath with walking long distances. He denies routine physical activity such as exercise. He has an occasional non productive cough but denies any hemoptysis. No unintended weight loss has been noted. No other complaints are verbalized.     PHYSICAL EXAM:  Wt Readings from Last 3 Encounters:  01/31/24 278 lb 6.4 oz (126.3 kg)  01/22/24 275 lb (124.7 kg)  01/11/24 280 lb (127 kg)   Temp Readings from Last 3 Encounters:  01/31/24 98.1 F (36.7 C) (Oral)  01/22/24 98.7 F (37.1 C)  01/11/24 (!) 97.4 F (36.3 C)   BP Readings from Last 3 Encounters:  01/31/24 (!) 123/55  01/30/24 120/78  01/22/24 106/64   Pulse Readings from Last 3 Encounters:  01/31/24 85  01/30/24 74  01/22/24 76   Pain Assessment Pain Score: 0-No pain/10  In general this is a well appearing caucasian male in no acute distress. He's alert and oriented x4 and appropriate throughout the examination. Cardiopulmonary assessment is negative for acute distress and he exhibits normal effort.    ECOG = 1  0 - Asymptomatic (Fully active, able  to carry on all predisease activities without restriction)  1 - Symptomatic but completely ambulatory (Restricted in physically strenuous activity but ambulatory and able to carry out work of a light or sedentary nature. For example, light housework, office work)  2 - Symptomatic, <50% in bed during the day (Ambulatory and capable of all self care but unable to carry out any work activities. Up and about more than 50% of waking hours)  3 - Symptomatic, >50% in bed, but not bedbound (Capable of only limited self-care, confined to bed or chair 50% or more of waking hours)  4 - Bedbound (Completely disabled. Cannot carry on any self-care. Totally confined to bed or chair)  5 - Death   Santiago Glad MM, Creech RH, Tormey DC, et al. [phone]). "Toxicity and response criteria of the [facility]". Am. Evlyn Clines. Oncol. 5 (6): 649-55    LABORATORY DATA:  Lab Results  Component Value Date   WBC 5.4 01/11/2024   HGB 13.5 01/11/2024   HCT 40.1 01/11/2024   MCV 96 01/11/2024   PLT 176 01/11/2024   Lab Results  Component Value Date   NA 141 01/22/2024   K 3.7 01/22/2024   CL 104 01/22/2024   CO2 26 01/22/2024   Lab Results  Component Value Date   ALT 117 (H) 01/11/2024   AST 66 (H) 01/11/2024   ALKPHOS 64 01/11/2024   BILITOT 0.5 01/11/2024      RADIOGRAPHY: CT Super D Chest Wo Contrast Result Date: 01/24/2024 CLINICAL DATA:  Solitary pulmonary nodule on lung CT scan. Follow-up. * Tracking Code: BO * EXAM: CT CHEST WITHOUT CONTRAST TECHNIQUE: Multidetector CT imaging of the chest was performed using thin slice collimation for electromagnetic bronchoscopy planning purposes, without intravenous contrast. RADIATION DOSE REDUCTION: This exam was performed according to the departmental dose-optimization program which includes automated exposure control, adjustment of the mA and/or kV according to patient size and/or use of iterative reconstruction technique. COMPARISON:  Chest CT scan  from 11/03/2023 and PET-CT scan from 12/07/2023. FINDINGS: Cardiovascular:  Normal cardiac size. No pericardial effusion. No aortic aneurysm. There are coronary artery calcifications, in keeping with coronary artery disease. There are also mild peripheral atherosclerotic vascular calcifications of thoracic aorta and its major branches. Mediastinum/Nodes: Visualized thyroid gland appears grossly unremarkable. No solid / cystic mediastinal masses. The esophagus is nondistended precluding optimal assessment. Redemonstration of several enlarged mediastinal/mainly right lower paratracheal lymph nodes with largest measuring up to 1.7 x 1.9 cm, slightly increased since the prior study when it measured up 1.5 x 1.8 cm, when remeasured in similar fashion. These lymph nodes were FDG avid on the prior PET-CT scan and compatible with metastasis. No new mediastinal lymph node is seen by size criteria. No axillary lymphadenopathy by size criteria. Evaluation of bilateral hila is limited due to lack on intravenous contrast: however, no large hilar lymphadenopathy identified. Lungs/Pleura: The central tracheo-bronchial tree is patent. Redemonstration of a lobulated predominantly solid 2.0 x 3.4 cm mass in the right lung apex, medially, which exhibits a new central cavitation since the prior chest CT scan but similar the prior PET-CT scan. The nodule appears slightly enlarged since the prior PET-CT scan when it measured up to 1.7 x 3.3 cm, when measured in similar fashion. No other new or suspicious lung nodule. There are mild upper lobe predominant emphysematous changes. No consolidation, pleural effusion or pneumothorax. Upper Abdomen: Visualized upper abdominal viscera within normal limits. Musculoskeletal: The visualized soft tissues of the chest wall are grossly unremarkable. No suspicious osseous lesions. There are mild multilevel degenerative changes in the visualized spine. IMPRESSION: 1. Redemonstration of patient's known  lobulated solid mass in the right lung apex with small central cavitation, slightly increased since the prior PET-CT scan. 2. Redemonstration of several enlarged metastatic mediastinal lymph nodes which also exhibits minimal interval increase in size compared to the prior chest CT scan. No new mediastinal lymphadenopathy by size criteria. 3. Multiple other nonacute observations, as described above. Aortic Atherosclerosis (ICD10-I70.0) and Emphysema (ICD10-J43.9). Electronically Signed   By: Jules Schick M.D.   On: 01/24/2024 09:32   DG Chest Port 1 View Result Date: 01/22/2024 CLINICAL DATA:  Postop from bronchoscopic biopsy of right lung mass. EXAM: PORTABLE CHEST 1 VIEW COMPARISON:  01/18/2023 FINDINGS: The heart size and mediastinal contours are within normal limits. Linear opacity in left lung base may be due to atelectasis or scarring. No focal consolidation or pleural effusion. The visualized skeletal structures are unremarkable. IMPRESSION: No pneumothorax visualized. Mild left basilar atelectasis or scarring. Electronically Signed   By: Danae Orleans M.D.   On: 01/22/2024 11:17   DG C-ARM BRONCHOSCOPY Result Date: 01/22/2024 C-ARM BRONCHOSCOPY: Fluoroscopy was utilized by the requesting physician.  No radiographic interpretation.       IMPRESSION/PLAN: 1. Stage IIIB, cT2bN3M0, NSCLC, squamous cell carcinoma of the RUL. Dr. Mitzi Hansen discusses the pathology findings and reviews the nature of locally advanced lung cancer.  We discussed the risks, benefits, short, and long term effects of radiotherapy, as well as the curative intent, and the patient is interested in proceeding. Dr. Mitzi Hansen discusses the delivery and logistics of radiotherapy and anticipates a course of 6 1/2 weeks of radiotherapy to the RUL target and regional adenopathy in the chest. After discussing the logistics of treatment, the patient is interested in referral to [facility] and requests radiation and chemotherapy given at that  institution. An urgent referral is placed for medical and radiation oncology. I will let Dr. Anders Simmonds know as well since I had reached out to her about this patient  prior to seeing him.    In a visit lasting 60 minutes, greater than 50% of the time was spent face to face discussing the patient's condition, in preparation for the discussion, and coordinating the patient's care.   The above documentation reflects my direct findings during this shared patient visit. Please see the separate note by Dr. Mitzi Hansen on this date for the remainder of the patient's plan of care.    Osker Mason, [facility]   **Disclaimer: This note was dictated with voice recognition software. Similar sounding words can inadvertently be transcribed and this note may contain transcription errors which may not have been corrected upon publication of note.**

## 2024-02-01 ENCOUNTER — Other Ambulatory Visit: Payer: Self-pay

## 2024-02-01 ENCOUNTER — Telehealth: Payer: Self-pay | Admitting: Acute Care

## 2024-02-01 DIAGNOSIS — C3490 Malignant neoplasm of unspecified part of unspecified bronchus or lung: Secondary | ICD-10-CM | POA: Diagnosis not present

## 2024-02-01 DIAGNOSIS — R599 Enlarged lymph nodes, unspecified: Secondary | ICD-10-CM | POA: Diagnosis not present

## 2024-02-01 DIAGNOSIS — C3411 Malignant neoplasm of upper lobe, right bronchus or lung: Secondary | ICD-10-CM | POA: Diagnosis not present

## 2024-02-01 DIAGNOSIS — C771 Secondary and unspecified malignant neoplasm of intrathoracic lymph nodes: Secondary | ICD-10-CM | POA: Diagnosis not present

## 2024-02-01 NOTE — Telephone Encounter (Signed)
Dr. Langston Masker would like to let Kandice Robinsons NP know he ordered to patient Juan Merritt biopsy of the neck node. Dr. Langston Masker phone number is [phone].

## 2024-02-01 NOTE — Telephone Encounter (Signed)
Sarah, please advise. Thanks.

## 2024-02-02 ENCOUNTER — Encounter (HOSPITAL_COMMUNITY): Payer: Self-pay

## 2024-02-02 ENCOUNTER — Other Ambulatory Visit (HOSPITAL_COMMUNITY): Payer: Self-pay | Admitting: Radiation Oncology

## 2024-02-02 ENCOUNTER — Encounter: Payer: Self-pay | Admitting: Radiation Oncology

## 2024-02-02 DIAGNOSIS — C3490 Malignant neoplasm of unspecified part of unspecified bronchus or lung: Secondary | ICD-10-CM

## 2024-02-02 DIAGNOSIS — R599 Enlarged lymph nodes, unspecified: Secondary | ICD-10-CM

## 2024-02-02 NOTE — Telephone Encounter (Signed)
I have spoken with [place], the nurse with Dr Langston Masker at [facility] in [place]. I advised her that she can call [facility] radiology to get the ultrasound guided biopsy of the neck lymph node. I gave her the number to call to get that scheduled. I offered to coordinate the MRI of the brain to be on the same day as the biopsy but she wanted to keep the date for that the same. Nothing further needed at this time.

## 2024-02-02 NOTE — Progress Notes (Signed)
The proposed treatment discussed in conference is for discussion purpose only and is not a binding recommendation.  The patients have not been physically examined, or presented with their treatment options.  Therefore, final treatment plans cannot be decided.

## 2024-02-03 ENCOUNTER — Ambulatory Visit (HOSPITAL_COMMUNITY)
Admission: RE | Admit: 2024-02-03 | Discharge: 2024-02-03 | Disposition: A | Discharge status: Home / Self Care | Payer: Medicare HMO | Source: Ambulatory Visit | Attending: Acute Care | Admitting: Acute Care

## 2024-02-03 DIAGNOSIS — C3490 Malignant neoplasm of unspecified part of unspecified bronchus or lung: Secondary | ICD-10-CM | POA: Insufficient documentation

## 2024-02-03 DIAGNOSIS — I6782 Cerebral ischemia: Secondary | ICD-10-CM | POA: Diagnosis not present

## 2024-02-03 MED ORDER — GADOBUTROL 1 MMOL/ML IV SOLN
10.0000 mL | Freq: Once | INTRAVENOUS | Status: AC | PRN
  Administered 2024-02-03: 10 mL via INTRAVENOUS

## 2024-02-04 ENCOUNTER — Ambulatory Visit (HOSPITAL_COMMUNITY): Admission: RE | Admit: 2024-02-04 | Payer: Medicare HMO | Source: Ambulatory Visit

## 2024-02-05 ENCOUNTER — Ambulatory Visit: Payer: Medicare HMO | Admitting: Oncology

## 2024-02-05 ENCOUNTER — Other Ambulatory Visit: Payer: Medicare HMO

## 2024-02-06 ENCOUNTER — Telehealth: Payer: Self-pay | Admitting: Family Medicine

## 2024-02-06 DIAGNOSIS — J449 Chronic obstructive pulmonary disease, unspecified: Secondary | ICD-10-CM | POA: Diagnosis not present

## 2024-02-06 DIAGNOSIS — M109 Gout, unspecified: Secondary | ICD-10-CM | POA: Diagnosis not present

## 2024-02-06 DIAGNOSIS — C3491 Malignant neoplasm of unspecified part of right bronchus or lung: Secondary | ICD-10-CM | POA: Diagnosis not present

## 2024-02-06 NOTE — Telephone Encounter (Signed)
pT SCHEDULED FOR FRIDAY. ls

## 2024-02-06 NOTE — Progress Notes (Unsigned)
Juan Lack, MD  [name] He had a positive bronchoscopy for lung carcinoma. Why do we need to biopsy a lymph node? The PET is from December. Do we have any other more recent outside imaging?  GY       Previous Messages    ----- Message ----- From: [name] Sent: 02/05/2024   9:23 AM EST To: [name]; Claudean Kinds; * Subject: [place] CORE BIOPSY (LYMPH NODES)                  Procedure :  [place] CORE BIOPSY (LYMPH NODES)  Reason :PET avid Right Neck Node Dx: Malignant neoplasm of lung, unspecified laterality, unspecified part of lung (HCC) [C34.90 (ICD-10-CM)]; Lymph node enlargement [R59.9 (ICD-10-CM)]  History :MR BRAIN W WO CONTRAST,DG Chest [place] 1 View,DG C-ARM BRONCHOSCOPY,CT Super D Chest Wo Contrast,NM PET Image Initial (PI) Skull Base To Thigh,CT CHEST LUNG CA SCREEN LOW DOSE W/O CM  Provider:  Nils Pyle, MD  Provider contact : [phone]

## 2024-02-06 NOTE — Telephone Encounter (Unsigned)
Copied from CRM [id number]. Topic: General - Other >> Feb 06, 2024  3:05 PM Eunice Blase wrote: Reason for CRM: Pt needs to schedule vaccine shots, pt will begin chemo in 2 weeks.It will not allow me schedule.  Please call [phone].

## 2024-02-07 ENCOUNTER — Inpatient Hospital Stay: Payer: Medicare HMO | Admitting: Oncology

## 2024-02-07 ENCOUNTER — Inpatient Hospital Stay: Payer: Medicare HMO

## 2024-02-08 ENCOUNTER — Ambulatory Visit (HOSPITAL_COMMUNITY): Admission: RE | Admit: 2024-02-08 | Payer: Medicare HMO | Source: Ambulatory Visit

## 2024-02-09 ENCOUNTER — Ambulatory Visit (INDEPENDENT_AMBULATORY_CARE_PROVIDER_SITE_OTHER): Payer: Medicare HMO

## 2024-02-09 ENCOUNTER — Other Ambulatory Visit: Payer: Self-pay | Admitting: Family Medicine

## 2024-02-09 ENCOUNTER — Telehealth: Payer: Self-pay | Admitting: Emergency Medicine

## 2024-02-09 ENCOUNTER — Other Ambulatory Visit (HOSPITAL_COMMUNITY): Payer: Self-pay

## 2024-02-09 ENCOUNTER — Telehealth: Payer: Self-pay

## 2024-02-09 DIAGNOSIS — Z23 Encounter for immunization: Secondary | ICD-10-CM

## 2024-02-09 NOTE — Telephone Encounter (Signed)
I would go with symbicort generic 160 for now and have pt return in a month to see if doing as well on this vs breztri (since this has two of the  meds   in symbicort so almost equivalent

## 2024-02-09 NOTE — Telephone Encounter (Signed)
 Lm for patient.

## 2024-02-09 NOTE — Telephone Encounter (Signed)
New Encounter created for follow up. For additional info see Pharmacy Prior Auth telephone encounter from 02-09-2024.

## 2024-02-09 NOTE — Telephone Encounter (Signed)
Pharmacy Patient Advocate Encounter  Contacted patient pharmacy to see current co-pay for [facility] and to place medication on hold for the time being. Ran test claims that show the following for Symbicort and alternatives in same class. If medication does not get changed, Markus Daft is still on hold at pharmacy and can be called to fill again - no new rx required.  For reference, Markus Daft is [phone] for the patient. Pharm tech stated knowledge of Medicare patients having a deductible and this is most likely the cause for higher co-pay. If this is the case, the co-pay will be high for medications until met.   Trelegy $166.57  Advair HFA (brand) $109.73 transition fill/non-formulary (generic) $121.32 Advair Diskus (brand) non-formulary (generic) $12.00 Wixela Inhub $12.00 Breo Ellipta $100.10 Dulera $117.89 Symbicort (brand) non-formulary (generic) $40.03 Breyna non-formulary

## 2024-02-09 NOTE — Progress Notes (Signed)
Patient is in office today for a nurse visit for Immunization - tdap. Patient Injection was given in the  Left deltoid. Patient tolerated injection well.

## 2024-02-09 NOTE — Telephone Encounter (Signed)
PT states they can not afford Breztri and seek an alt med or to be put back on Symbicort. Please call to advise wife (DPR) @ [phone]

## 2024-02-11 DIAGNOSIS — C3490 Malignant neoplasm of unspecified part of unspecified bronchus or lung: Secondary | ICD-10-CM | POA: Diagnosis not present

## 2024-02-12 DIAGNOSIS — J449 Chronic obstructive pulmonary disease, unspecified: Secondary | ICD-10-CM | POA: Diagnosis not present

## 2024-02-12 DIAGNOSIS — C3411 Malignant neoplasm of upper lobe, right bronchus or lung: Secondary | ICD-10-CM | POA: Diagnosis not present

## 2024-02-12 DIAGNOSIS — M109 Gout, unspecified: Secondary | ICD-10-CM | POA: Diagnosis not present

## 2024-02-12 DIAGNOSIS — C3491 Malignant neoplasm of unspecified part of right bronchus or lung: Secondary | ICD-10-CM | POA: Diagnosis not present

## 2024-02-12 NOTE — Telephone Encounter (Signed)
 Patient would prefer to go back to the Symbicort because Markus Daft is too expensive.  Pharmacy:[facility] in [place]

## 2024-02-13 ENCOUNTER — Telehealth: Payer: Self-pay | Admitting: Internal Medicine

## 2024-02-13 ENCOUNTER — Ambulatory Visit (HOSPITAL_COMMUNITY)
Admission: RE | Admit: 2024-02-13 | Discharge: 2024-02-13 | Disposition: A | Discharge status: Home / Self Care | Payer: Medicare HMO | Source: Ambulatory Visit | Attending: Internal Medicine | Admitting: Internal Medicine

## 2024-02-13 DIAGNOSIS — J449 Chronic obstructive pulmonary disease, unspecified: Secondary | ICD-10-CM | POA: Diagnosis not present

## 2024-02-13 DIAGNOSIS — M109 Gout, unspecified: Secondary | ICD-10-CM | POA: Diagnosis not present

## 2024-02-13 DIAGNOSIS — E1165 Type 2 diabetes mellitus with hyperglycemia: Secondary | ICD-10-CM | POA: Diagnosis not present

## 2024-02-13 DIAGNOSIS — C3411 Malignant neoplasm of upper lobe, right bronchus or lung: Secondary | ICD-10-CM | POA: Diagnosis not present

## 2024-02-13 DIAGNOSIS — C3491 Malignant neoplasm of unspecified part of right bronchus or lung: Secondary | ICD-10-CM | POA: Diagnosis not present

## 2024-02-13 LAB — PULMONARY FUNCTION TEST
DL/VA % pred: 93 %
DL/VA: 3.75 ml/min/mmHg/L
DLCO unc % pred: 84 %
DLCO unc: 21.8 ml/min/mmHg
FEF 25-75 Post: 0.87 L/s
FEF 25-75 Pre: 0.81 L/s
FEF2575-%Change-Post: 7 %
FEF2575-%Pred-Post: 35 %
FEF2575-%Pred-Pre: 32 %
FEV1-%Change-Post: 2 %
FEV1-%Pred-Post: 60 %
FEV1-%Pred-Pre: 59 %
FEV1-Post: 1.98 L
FEV1-Pre: 1.93 L
FEV1FVC-%Change-Post: 0 %
FEV1FVC-%Pred-Pre: 75 %
FEV6-%Change-Post: 1 %
FEV6-%Pred-Post: 78 %
FEV6-%Pred-Pre: 76 %
FEV6-Post: 3.27 L
FEV6-Pre: 3.21 L
FEV6FVC-%Change-Post: 0 %
FEV6FVC-%Pred-Post: 98 %
FEV6FVC-%Pred-Pre: 98 %
FVC-%Change-Post: 1 %
FVC-%Pred-Post: 79 %
FVC-%Pred-Pre: 78 %
FVC-Post: 3.51 L
FVC-Pre: 3.46 L
Post FEV1/FVC ratio: 56 %
Post FEV6/FVC ratio: 93 %
Pre FEV1/FVC ratio: 56 %
Pre FEV6/FVC Ratio: 93 %
RV % pred: 134 %
RV: 3.29 L
TLC % pred: 99 %
TLC: 6.97 L

## 2024-02-13 MED ORDER — ALBUTEROL SULFATE (2.5 MG/3ML) 0.083% IN NEBU
2.5000 mg | INHALATION_SOLUTION | Freq: Once | RESPIRATORY_TRACT | Status: AC
  Administered 2024-02-13: 2.5 mg via RESPIRATORY_TRACT

## 2024-02-13 NOTE — Telephone Encounter (Signed)
 Spoke with spouse Nelva Bush who will discuss with patient as to whether he wants to switch to generic Symbicort. She will call back.

## 2024-02-13 NOTE — Telephone Encounter (Signed)
 Patient would like to try the Symbicort.  [facility] [place]

## 2024-02-14 ENCOUNTER — Other Ambulatory Visit: Payer: Self-pay

## 2024-02-14 DIAGNOSIS — J449 Chronic obstructive pulmonary disease, unspecified: Secondary | ICD-10-CM | POA: Diagnosis not present

## 2024-02-14 DIAGNOSIS — C3491 Malignant neoplasm of unspecified part of right bronchus or lung: Secondary | ICD-10-CM | POA: Diagnosis not present

## 2024-02-14 MED ORDER — BUDESONIDE-FORMOTEROL FUMARATE 160-4.5 MCG/ACT IN AERO: INHALATION_SPRAY | RESPIRATORY_TRACT | 6 refills | Status: DC

## 2024-02-14 NOTE — Telephone Encounter (Signed)
 Patient would prefer to go back to the Symbicort because Markus Daft is too expensive.

## 2024-02-14 NOTE — Telephone Encounter (Signed)
 Called and lvm to inform pt that his [facility] has been [facility] [place] and his been changed, he can call us back if he has any further concerns.

## 2024-02-15 ENCOUNTER — Encounter (HOSPITAL_COMMUNITY): Payer: Self-pay

## 2024-02-15 DIAGNOSIS — J449 Chronic obstructive pulmonary disease, unspecified: Secondary | ICD-10-CM | POA: Diagnosis not present

## 2024-02-15 DIAGNOSIS — C3491 Malignant neoplasm of unspecified part of right bronchus or lung: Secondary | ICD-10-CM | POA: Diagnosis not present

## 2024-02-16 DIAGNOSIS — J449 Chronic obstructive pulmonary disease, unspecified: Secondary | ICD-10-CM | POA: Diagnosis not present

## 2024-02-16 DIAGNOSIS — Z7984 Long term (current) use of oral hypoglycemic drugs: Secondary | ICD-10-CM | POA: Diagnosis not present

## 2024-02-16 DIAGNOSIS — Z79899 Other long term (current) drug therapy: Secondary | ICD-10-CM | POA: Diagnosis not present

## 2024-02-16 DIAGNOSIS — Z6841 Body Mass Index (BMI) 40.0 and over, adult: Secondary | ICD-10-CM | POA: Diagnosis not present

## 2024-02-16 DIAGNOSIS — E119 Type 2 diabetes mellitus without complications: Secondary | ICD-10-CM | POA: Diagnosis not present

## 2024-02-16 DIAGNOSIS — M109 Gout, unspecified: Secondary | ICD-10-CM | POA: Diagnosis not present

## 2024-02-16 DIAGNOSIS — I2510 Atherosclerotic heart disease of native coronary artery without angina pectoris: Secondary | ICD-10-CM | POA: Diagnosis not present

## 2024-02-16 DIAGNOSIS — E785 Hyperlipidemia, unspecified: Secondary | ICD-10-CM | POA: Diagnosis not present

## 2024-02-16 DIAGNOSIS — I10 Essential (primary) hypertension: Secondary | ICD-10-CM | POA: Diagnosis not present

## 2024-02-16 DIAGNOSIS — Z7982 Long term (current) use of aspirin: Secondary | ICD-10-CM | POA: Diagnosis not present

## 2024-02-16 DIAGNOSIS — J9811 Atelectasis: Secondary | ICD-10-CM | POA: Diagnosis not present

## 2024-02-16 DIAGNOSIS — F17220 Nicotine dependence, chewing tobacco, uncomplicated: Secondary | ICD-10-CM | POA: Diagnosis not present

## 2024-02-16 DIAGNOSIS — C3491 Malignant neoplasm of unspecified part of right bronchus or lung: Secondary | ICD-10-CM | POA: Diagnosis not present

## 2024-02-16 DIAGNOSIS — F17210 Nicotine dependence, cigarettes, uncomplicated: Secondary | ICD-10-CM | POA: Diagnosis not present

## 2024-02-16 DIAGNOSIS — C3490 Malignant neoplasm of unspecified part of unspecified bronchus or lung: Secondary | ICD-10-CM | POA: Diagnosis not present

## 2024-02-18 DIAGNOSIS — C3491 Malignant neoplasm of unspecified part of right bronchus or lung: Secondary | ICD-10-CM | POA: Diagnosis not present

## 2024-02-18 DIAGNOSIS — J449 Chronic obstructive pulmonary disease, unspecified: Secondary | ICD-10-CM | POA: Diagnosis not present

## 2024-02-19 DIAGNOSIS — J449 Chronic obstructive pulmonary disease, unspecified: Secondary | ICD-10-CM | POA: Diagnosis not present

## 2024-02-19 DIAGNOSIS — C3491 Malignant neoplasm of unspecified part of right bronchus or lung: Secondary | ICD-10-CM | POA: Diagnosis not present

## 2024-02-21 DIAGNOSIS — R590 Localized enlarged lymph nodes: Secondary | ICD-10-CM | POA: Diagnosis not present

## 2024-02-21 DIAGNOSIS — C771 Secondary and unspecified malignant neoplasm of intrathoracic lymph nodes: Secondary | ICD-10-CM | POA: Diagnosis not present

## 2024-02-21 DIAGNOSIS — C3411 Malignant neoplasm of upper lobe, right bronchus or lung: Secondary | ICD-10-CM | POA: Diagnosis not present

## 2024-02-21 LAB — FUNGUS CULTURE WITH STAIN

## 2024-02-21 LAB — FUNGUS CULTURE RESULT

## 2024-02-21 LAB — FUNGAL ORGANISM REFLEX

## 2024-02-22 ENCOUNTER — Ambulatory Visit: Payer: Self-pay | Admitting: Family Medicine

## 2024-02-22 NOTE — Telephone Encounter (Signed)
 Chief Complaint: Cough Symptoms: Cough, shortness of breath  Frequency: Intermittent  Pertinent Negatives: Patient denies fever Disposition: [] ED /[] Urgent Care (no appt availability in office) / [x] Appointment(In office/virtual)/ []  [place] [facility]/ [] Home Care/ [] Refused Recommended Disposition /[] [facility]/ []  Follow-up with PCP Additional Notes: Patient has had a cough for the last 6 days. Patient has a history of COPD and has had some shortness of breath with his cough that has been controlled with his inhaler. Appointment made for tomorrow morning for evaluation.    Reason for Disposition  [1] Continuous (nonstop) coughing interferes with work or school AND [2] no improvement using cough treatment per Care Advice  Answer Assessment - Initial Assessment Questions 1. ONSET: "When did the cough begin?"      6 days  2. SEVERITY: "How bad is the cough today?"      Moderate  3. SPUTUM: "Describe the color of your sputum" (none, dry cough; clear, white, yellow, green)     No 4. HEMOPTYSIS: "Are you coughing up any blood?" If so ask: "How much?" (flecks, streaks, tablespoons, etc.)     No 5. DIFFICULTY BREATHING: "Are you having difficulty breathing?" If Yes, ask: "How bad is it?" (e.g., mild, moderate, severe)    - MILD: No SOB at rest, mild SOB with walking, speaks normally in sentences, can lie down, no retractions, pulse < 100.    - MODERATE: SOB at rest, SOB with minimal exertion and prefers to sit, cannot lie down flat, speaks in phrases, mild retractions, audible wheezing, pulse 100-120.    - SEVERE: Very SOB at rest, speaks in single words, struggling to breathe, sitting hunched forward, retractions, pulse > 120      Moderate  6. FEVER: "Do you have a fever?" If Yes, ask: "What is your temperature, how was it measured, and when did it start?"     No 7. CARDIAC HISTORY: "Do you have any history of heart disease?" (e.g., heart attack, congestive heart failure)       No 8. LUNG HISTORY: "Do you have any history of lung disease?"  (e.g., pulmonary embolus, asthma, emphysema)     COPD 9. PE RISK FACTORS: "Do you have a history of blood clots?" (or: recent major surgery, recent prolonged travel, bedridden)     No 10. OTHER SYMPTOMS: "Do you have any other symptoms?" (e.g., runny nose, wheezing, chest pain)       Fatigue  Protocols used: Cough - Acute Non-Productive-A-AH

## 2024-02-23 ENCOUNTER — Ambulatory Visit: Admitting: Nurse Practitioner

## 2024-02-23 DIAGNOSIS — R0602 Shortness of breath: Secondary | ICD-10-CM | POA: Diagnosis not present

## 2024-02-23 DIAGNOSIS — J188 Other pneumonia, unspecified organism: Secondary | ICD-10-CM | POA: Diagnosis not present

## 2024-02-23 DIAGNOSIS — J1008 Influenza due to other identified influenza virus with other specified pneumonia: Secondary | ICD-10-CM | POA: Diagnosis not present

## 2024-02-23 DIAGNOSIS — R918 Other nonspecific abnormal finding of lung field: Secondary | ICD-10-CM | POA: Diagnosis not present

## 2024-02-23 DIAGNOSIS — Z720 Tobacco use: Secondary | ICD-10-CM | POA: Diagnosis not present

## 2024-02-23 DIAGNOSIS — Z8719 Personal history of other diseases of the digestive system: Secondary | ICD-10-CM | POA: Diagnosis not present

## 2024-02-23 DIAGNOSIS — Z8709 Personal history of other diseases of the respiratory system: Secondary | ICD-10-CM | POA: Diagnosis not present

## 2024-02-23 DIAGNOSIS — Z9981 Dependence on supplemental oxygen: Secondary | ICD-10-CM | POA: Diagnosis not present

## 2024-02-23 DIAGNOSIS — C3411 Malignant neoplasm of upper lobe, right bronchus or lung: Secondary | ICD-10-CM | POA: Diagnosis not present

## 2024-02-23 DIAGNOSIS — Z792 Long term (current) use of antibiotics: Secondary | ICD-10-CM | POA: Diagnosis not present

## 2024-02-23 DIAGNOSIS — J101 Influenza due to other identified influenza virus with other respiratory manifestations: Secondary | ICD-10-CM | POA: Diagnosis not present

## 2024-02-23 DIAGNOSIS — A419 Sepsis, unspecified organism: Secondary | ICD-10-CM | POA: Diagnosis not present

## 2024-02-23 DIAGNOSIS — J168 Pneumonia due to other specified infectious organisms: Secondary | ICD-10-CM | POA: Diagnosis not present

## 2024-02-23 DIAGNOSIS — Z85118 Personal history of other malignant neoplasm of bronchus and lung: Secondary | ICD-10-CM | POA: Diagnosis not present

## 2024-02-23 DIAGNOSIS — R0600 Dyspnea, unspecified: Secondary | ICD-10-CM | POA: Diagnosis not present

## 2024-02-23 DIAGNOSIS — J189 Pneumonia, unspecified organism: Secondary | ICD-10-CM | POA: Diagnosis not present

## 2024-02-23 DIAGNOSIS — N179 Acute kidney failure, unspecified: Secondary | ICD-10-CM | POA: Diagnosis not present

## 2024-02-23 DIAGNOSIS — Z87891 Personal history of nicotine dependence: Secondary | ICD-10-CM | POA: Diagnosis not present

## 2024-02-23 DIAGNOSIS — I10 Essential (primary) hypertension: Secondary | ICD-10-CM | POA: Diagnosis not present

## 2024-02-23 DIAGNOSIS — Z87448 Personal history of other diseases of urinary system: Secondary | ICD-10-CM | POA: Diagnosis not present

## 2024-02-23 DIAGNOSIS — E119 Type 2 diabetes mellitus without complications: Secondary | ICD-10-CM | POA: Diagnosis not present

## 2024-02-24 DIAGNOSIS — M109 Gout, unspecified: Secondary | ICD-10-CM | POA: Diagnosis not present

## 2024-02-24 DIAGNOSIS — I10 Essential (primary) hypertension: Secondary | ICD-10-CM | POA: Diagnosis not present

## 2024-02-24 DIAGNOSIS — J449 Chronic obstructive pulmonary disease, unspecified: Secondary | ICD-10-CM | POA: Diagnosis not present

## 2024-02-24 DIAGNOSIS — Z85118 Personal history of other malignant neoplasm of bronchus and lung: Secondary | ICD-10-CM | POA: Diagnosis not present

## 2024-02-24 DIAGNOSIS — N179 Acute kidney failure, unspecified: Secondary | ICD-10-CM | POA: Diagnosis not present

## 2024-02-24 DIAGNOSIS — Z720 Tobacco use: Secondary | ICD-10-CM | POA: Diagnosis not present

## 2024-02-24 DIAGNOSIS — J1000 Influenza due to other identified influenza virus with unspecified type of pneumonia: Secondary | ICD-10-CM | POA: Diagnosis not present

## 2024-02-24 DIAGNOSIS — E119 Type 2 diabetes mellitus without complications: Secondary | ICD-10-CM | POA: Diagnosis not present

## 2024-02-24 DIAGNOSIS — N400 Enlarged prostate without lower urinary tract symptoms: Secondary | ICD-10-CM | POA: Diagnosis not present

## 2024-02-24 DIAGNOSIS — Z79899 Other long term (current) drug therapy: Secondary | ICD-10-CM | POA: Diagnosis not present

## 2024-02-26 ENCOUNTER — Telehealth: Payer: Self-pay | Admitting: *Deleted

## 2024-02-26 DIAGNOSIS — J449 Chronic obstructive pulmonary disease, unspecified: Secondary | ICD-10-CM | POA: Diagnosis not present

## 2024-02-26 NOTE — Transitions of Care (Post Inpatient/ED Visit) (Signed)
   02/26/2024  Name: MARINUS EICHER MRN: [id number] DOB: 1951/10/03  Today's TOC FU Call Status: Today's TOC FU Call Status:: Unsuccessful Call (1st Attempt) Unsuccessful Call (1st Attempt) Date: 02/26/24  Attempted to reach the patient regarding the most recent Inpatient/ED visit.  Follow Up Plan: Additional outreach attempts will be made to reach the patient to complete the Transitions of Care (Post Inpatient/ED visit) call.   Irving Shows [facility], BSN RN Care Manager/ Transition of Care Door/ [facility] [phone]

## 2024-02-27 ENCOUNTER — Telehealth: Payer: Self-pay

## 2024-02-27 NOTE — Transitions of Care (Post Inpatient/ED Visit) (Signed)
   02/27/2024  Name: Juan Merritt MRN: [id number] DOB: 07/22/1951  Today's TOC FU Call Status: Today's TOC FU Call Status:: Unsuccessful Call (2nd Attempt) Unsuccessful Call (1st Attempt) Date: 02/26/24 Unsuccessful Call (2nd Attempt) Date: 02/27/24  Attempted to reach the patient regarding the most recent Inpatient/ED visit.  Follow Up Plan: Additional outreach attempts will be made to reach the patient to complete the Transitions of Care (Post Inpatient/ED visit) call.   Deidre Ala, BSN, RN [place]  [facility]BC[facility] - [facility] RN Care Manager [phone]

## 2024-02-28 ENCOUNTER — Telehealth: Payer: Self-pay | Admitting: *Deleted

## 2024-02-28 ENCOUNTER — Encounter: Payer: Self-pay | Admitting: *Deleted

## 2024-02-28 DIAGNOSIS — C3491 Malignant neoplasm of unspecified part of right bronchus or lung: Secondary | ICD-10-CM | POA: Diagnosis not present

## 2024-02-28 NOTE — Transitions of Care (Post Inpatient/ED Visit) (Signed)
 02/28/2024  Name: Juan Merritt MRN: [id number] DOB: 1951-08-17  Today's TOC FU Call Status: Today's TOC FU Call Status:: Successful TOC FU Call Completed TOC FU Call Complete Date: 02/28/24 Patient's Name and Date of Birth confirmed.  Transition Care Management Follow-up Telephone Call Date of Discharge: 02/24/24 Discharge Facility: Other [facility]) Name of Other (Non[facility]) Discharge Facility: [facility] Primary Inpatient Discharge Diagnosis:: Influenza A and Pneumonia How have you been since you were released from the hospital?: Better (eating, drinking well, ambulating without difficulty, checks CBG "readings good" per pt, continues to drive) Any questions or concerns?: No  Items Reviewed: Did you receive and understand the discharge instructions provided?: Yes Medications obtained,verified, and reconciled?: Yes (Medications Reviewed) Any new allergies since your discharge?: No Dietary orders reviewed?: Yes Type of Diet Ordered:: heart healthy, carbohydrate modified Do you have support at home?: Yes People in Home: spouse Name of Support/Comfort Primary Source: Juan Merritt Patient declines enrollment in [facility] 30 day program Patient states he has recovered well from "flu", no issues with breathing Checks CBG with CGM, states " readings are good"  AIC 6.1  Medications Reviewed Today: Medications Reviewed Today     Reviewed by Audrie Gallus, RN (Registered Nurse) on 02/28/24 at 1528  Med List Status: <None>   Medication Order Taking? Sig Documenting Provider Last Dose Status Informant  albuterol (VENTOLIN HFA) 108 (90 Base) MCG/ACT inhaler [id number] Yes Inhale 2 puffs into the lungs every 4 (four) hours as needed for shortness of breath or wheezing. Leslye Peer, MD Taking Active   allopurinol (ZYLOPRIM) 100 MG tablet [id number] Yes TAKE 1 TABLET (100 MG TOTAL) BY MOUTH DAILY. TO PREVENT GOUT Mechele Claude, MD Taking Active   amLODipine (NORVASC) 5 MG  tablet [id number] Yes TAKE 1 TABLET (5 MG TOTAL) BY MOUTH DAILY. Mechele Claude, MD Taking Active   aspirin EC 81 MG tablet [id number] Yes Take 81 mg by mouth daily. Swallow whole. [provider] Taking Active Spouse/Significant Other, Self  Budeson-Glycopyrrol-Formoterol (BREZTRI AEROSPHERE) 160-9-4.8 MCG/ACT AERO [id number] Yes Take 2 puffs first thing in am and then another 2 puffs about 12 hours later. Nyoka Cowden, MD Taking Active   budesonide-formoterol [facility]) 160-4.5 MCG/ACT inhaler [id number] Yes 2 puffs first thing in am and then another 2 puffs about 12 hours later. Nyoka Cowden, MD Taking Active   colchicine 0.6 MG tablet [id number] Yes TAKE TWICE DAILY FOR GOUT ATTACK. (MAY TAKE EVERY TWO HOURS UP TO 6 DOSES AT ACUTE ONSET) Mechele Claude, MD Taking Active   Continuous Blood Gluc Sensor (FREESTYLE LIBRE 2 SENSOR) [place] [id number] Yes USE TO TEST BLOOD SUGAR CONTINUOUSLY. Dx. E11.65 SEND TO [facility] FOR MEDICARE COVERAGE, DO NOT SEND TO LOCAL PHARMACY Stacks, Warren, MD Taking Active   ipratropium-albuterol (DUONEB) 0.5-2.5 (3) MG/3ML SOLN [id number] Yes Take 3 mLs by nebulization every 4 (four) hours as needed (SOB). Leslye Peer, MD Taking Active   losartan (COZAAR) 100 MG tablet [id number] Yes Take 1 tablet (100 mg total) by mouth daily. Mechele Claude, MD Taking Active   meloxicam (MOBIC) 15 MG tablet [id number] Yes TAKE 1 TABLET (15 MG TOTAL) BY MOUTH DAILY. Mechele Claude, MD Taking Active   metFORMIN (GLUCOPHAGE-XR) 750 MG 24 hr tablet [id number] Yes Take 1 tablet (750 mg total) by mouth daily with breakfast. Mechele Claude, MD Taking Active   nicotine (NICODERM CQ) 21 mg/24hr patch [id number] Yes Place 1 patch (21 mg total) onto the skin daily. Mechele Claude, MD Taking Active  potassium chloride SA (KLOR-CON M20) 20 MEQ tablet [id number] Yes TAKE 1 TABLET (20 MEQ TOTAL) DAILY BY MOUTH. Mechele Claude, MD Taking Active   tamsulosin (FLOMAX) 0.4 MG CAPS capsule  [id number] Yes TAKE 2 CAPSULES BY MOUTH AT BEDTIME FOR URINE FLOW AND PROSTATE Mechele Claude, MD Taking Active   torsemide (DEMADEX) 100 MG tablet [id number] Yes Take 1 tablet (100 mg total) by mouth daily. At arising and at lunch Byrum, Les Pou, MD Taking Active             Home Care and Equipment/Supplies: Were Home Health Services Ordered?: No Any new equipment or medical supplies ordered?: No  Functional Questionnaire: Do you need assistance with bathing/showering or dressing?: No Do you need assistance with meal preparation?: No Do you need assistance with eating?: No Do you have difficulty maintaining continence: No Do you need assistance with getting out of bed/getting out of a chair/moving?: No Do you have difficulty managing or taking your medications?: No  Follow up appointments reviewed: PCP Follow-up appointment confirmed?: Yes Date of PCP follow-up appointment?: 04/11/24 Follow-up Provider: Dr. Darlyn Read  @ 825 am   pt does not want sooner primary care provider appointment citing he is going to pulmonologist on 4/3 and starts radiation and chemotherapy next week Specialist Hospital Follow-up appointment confirmed?: Yes Date of Specialist follow-up appointment?: 02/19/24 Follow-Up Specialty Provider:: Dr. Sherene Sires  @ 10 am  pulmonary Do you need transportation to your follow-up appointment?: No Do you understand care options if your condition(s) worsen?: Yes-patient verbalized understanding  SDOH Interventions Today    Flowsheet Row Most Recent Value  SDOH Interventions   Food Insecurity Interventions Intervention Not Indicated  Housing Interventions Intervention Not Indicated  Utilities Interventions Intervention Not Indicated       [name], BSN RN Care Manager/ Transition of Care [place] [phone]

## 2024-03-01 DIAGNOSIS — C3411 Malignant neoplasm of upper lobe, right bronchus or lung: Secondary | ICD-10-CM | POA: Diagnosis not present

## 2024-03-01 DIAGNOSIS — C3490 Malignant neoplasm of unspecified part of unspecified bronchus or lung: Secondary | ICD-10-CM | POA: Diagnosis not present

## 2024-03-01 DIAGNOSIS — C771 Secondary and unspecified malignant neoplasm of intrathoracic lymph nodes: Secondary | ICD-10-CM | POA: Diagnosis not present

## 2024-03-01 DIAGNOSIS — C3491 Malignant neoplasm of unspecified part of right bronchus or lung: Secondary | ICD-10-CM | POA: Diagnosis not present

## 2024-03-01 DIAGNOSIS — R599 Enlarged lymph nodes, unspecified: Secondary | ICD-10-CM | POA: Diagnosis not present

## 2024-03-02 ENCOUNTER — Other Ambulatory Visit: Payer: Self-pay | Admitting: Family Medicine

## 2024-03-02 DIAGNOSIS — I10 Essential (primary) hypertension: Secondary | ICD-10-CM

## 2024-03-02 DIAGNOSIS — E790 Hyperuricemia without signs of inflammatory arthritis and tophaceous disease: Secondary | ICD-10-CM

## 2024-03-02 DIAGNOSIS — J449 Chronic obstructive pulmonary disease, unspecified: Secondary | ICD-10-CM

## 2024-03-04 DIAGNOSIS — C771 Secondary and unspecified malignant neoplasm of intrathoracic lymph nodes: Secondary | ICD-10-CM | POA: Diagnosis not present

## 2024-03-04 DIAGNOSIS — C3491 Malignant neoplasm of unspecified part of right bronchus or lung: Secondary | ICD-10-CM | POA: Diagnosis not present

## 2024-03-04 DIAGNOSIS — C3411 Malignant neoplasm of upper lobe, right bronchus or lung: Secondary | ICD-10-CM | POA: Diagnosis not present

## 2024-03-04 DIAGNOSIS — C3490 Malignant neoplasm of unspecified part of unspecified bronchus or lung: Secondary | ICD-10-CM | POA: Diagnosis not present

## 2024-03-04 DIAGNOSIS — R599 Enlarged lymph nodes, unspecified: Secondary | ICD-10-CM | POA: Diagnosis not present

## 2024-03-04 NOTE — Telephone Encounter (Signed)
 Albuterol Inhaler rx'd by ER doctor-ok to refill?

## 2024-03-05 DIAGNOSIS — C3491 Malignant neoplasm of unspecified part of right bronchus or lung: Secondary | ICD-10-CM | POA: Diagnosis not present

## 2024-03-05 DIAGNOSIS — C3411 Malignant neoplasm of upper lobe, right bronchus or lung: Secondary | ICD-10-CM | POA: Diagnosis not present

## 2024-03-05 DIAGNOSIS — C771 Secondary and unspecified malignant neoplasm of intrathoracic lymph nodes: Secondary | ICD-10-CM | POA: Diagnosis not present

## 2024-03-05 DIAGNOSIS — R599 Enlarged lymph nodes, unspecified: Secondary | ICD-10-CM | POA: Diagnosis not present

## 2024-03-05 DIAGNOSIS — C3490 Malignant neoplasm of unspecified part of unspecified bronchus or lung: Secondary | ICD-10-CM | POA: Diagnosis not present

## 2024-03-06 DIAGNOSIS — R599 Enlarged lymph nodes, unspecified: Secondary | ICD-10-CM | POA: Diagnosis not present

## 2024-03-06 DIAGNOSIS — C3491 Malignant neoplasm of unspecified part of right bronchus or lung: Secondary | ICD-10-CM | POA: Diagnosis not present

## 2024-03-06 DIAGNOSIS — C3490 Malignant neoplasm of unspecified part of unspecified bronchus or lung: Secondary | ICD-10-CM | POA: Diagnosis not present

## 2024-03-06 DIAGNOSIS — C771 Secondary and unspecified malignant neoplasm of intrathoracic lymph nodes: Secondary | ICD-10-CM | POA: Diagnosis not present

## 2024-03-06 DIAGNOSIS — C3411 Malignant neoplasm of upper lobe, right bronchus or lung: Secondary | ICD-10-CM | POA: Diagnosis not present

## 2024-03-06 LAB — ACID FAST CULTURE WITH REFLEXED SENSITIVITIES (MYCOBACTERIA): Acid Fast Culture: NEGATIVE

## 2024-03-07 DIAGNOSIS — R599 Enlarged lymph nodes, unspecified: Secondary | ICD-10-CM | POA: Diagnosis not present

## 2024-03-07 DIAGNOSIS — C3491 Malignant neoplasm of unspecified part of right bronchus or lung: Secondary | ICD-10-CM | POA: Diagnosis not present

## 2024-03-07 DIAGNOSIS — C3411 Malignant neoplasm of upper lobe, right bronchus or lung: Secondary | ICD-10-CM | POA: Diagnosis not present

## 2024-03-07 DIAGNOSIS — C3490 Malignant neoplasm of unspecified part of unspecified bronchus or lung: Secondary | ICD-10-CM | POA: Diagnosis not present

## 2024-03-07 DIAGNOSIS — C771 Secondary and unspecified malignant neoplasm of intrathoracic lymph nodes: Secondary | ICD-10-CM | POA: Diagnosis not present

## 2024-03-08 DIAGNOSIS — C3490 Malignant neoplasm of unspecified part of unspecified bronchus or lung: Secondary | ICD-10-CM | POA: Diagnosis not present

## 2024-03-08 DIAGNOSIS — C3411 Malignant neoplasm of upper lobe, right bronchus or lung: Secondary | ICD-10-CM | POA: Diagnosis not present

## 2024-03-08 DIAGNOSIS — C3491 Malignant neoplasm of unspecified part of right bronchus or lung: Secondary | ICD-10-CM | POA: Diagnosis not present

## 2024-03-08 DIAGNOSIS — C771 Secondary and unspecified malignant neoplasm of intrathoracic lymph nodes: Secondary | ICD-10-CM | POA: Diagnosis not present

## 2024-03-08 DIAGNOSIS — R599 Enlarged lymph nodes, unspecified: Secondary | ICD-10-CM | POA: Diagnosis not present

## 2024-03-11 DIAGNOSIS — C3491 Malignant neoplasm of unspecified part of right bronchus or lung: Secondary | ICD-10-CM | POA: Diagnosis not present

## 2024-03-11 DIAGNOSIS — C771 Secondary and unspecified malignant neoplasm of intrathoracic lymph nodes: Secondary | ICD-10-CM | POA: Diagnosis not present

## 2024-03-11 DIAGNOSIS — C3411 Malignant neoplasm of upper lobe, right bronchus or lung: Secondary | ICD-10-CM | POA: Diagnosis not present

## 2024-03-11 DIAGNOSIS — C3490 Malignant neoplasm of unspecified part of unspecified bronchus or lung: Secondary | ICD-10-CM | POA: Diagnosis not present

## 2024-03-11 DIAGNOSIS — R599 Enlarged lymph nodes, unspecified: Secondary | ICD-10-CM | POA: Diagnosis not present

## 2024-03-12 DIAGNOSIS — C3490 Malignant neoplasm of unspecified part of unspecified bronchus or lung: Secondary | ICD-10-CM | POA: Diagnosis not present

## 2024-03-12 DIAGNOSIS — C3491 Malignant neoplasm of unspecified part of right bronchus or lung: Secondary | ICD-10-CM | POA: Diagnosis not present

## 2024-03-12 DIAGNOSIS — C771 Secondary and unspecified malignant neoplasm of intrathoracic lymph nodes: Secondary | ICD-10-CM | POA: Diagnosis not present

## 2024-03-12 DIAGNOSIS — C3411 Malignant neoplasm of upper lobe, right bronchus or lung: Secondary | ICD-10-CM | POA: Diagnosis not present

## 2024-03-12 DIAGNOSIS — R599 Enlarged lymph nodes, unspecified: Secondary | ICD-10-CM | POA: Diagnosis not present

## 2024-03-13 DIAGNOSIS — C3411 Malignant neoplasm of upper lobe, right bronchus or lung: Secondary | ICD-10-CM | POA: Diagnosis not present

## 2024-03-13 DIAGNOSIS — R599 Enlarged lymph nodes, unspecified: Secondary | ICD-10-CM | POA: Diagnosis not present

## 2024-03-13 DIAGNOSIS — C3490 Malignant neoplasm of unspecified part of unspecified bronchus or lung: Secondary | ICD-10-CM | POA: Diagnosis not present

## 2024-03-13 DIAGNOSIS — C3491 Malignant neoplasm of unspecified part of right bronchus or lung: Secondary | ICD-10-CM | POA: Diagnosis not present

## 2024-03-13 DIAGNOSIS — C771 Secondary and unspecified malignant neoplasm of intrathoracic lymph nodes: Secondary | ICD-10-CM | POA: Diagnosis not present

## 2024-03-14 DIAGNOSIS — C3491 Malignant neoplasm of unspecified part of right bronchus or lung: Secondary | ICD-10-CM | POA: Diagnosis not present

## 2024-03-14 DIAGNOSIS — R599 Enlarged lymph nodes, unspecified: Secondary | ICD-10-CM | POA: Diagnosis not present

## 2024-03-14 DIAGNOSIS — C3490 Malignant neoplasm of unspecified part of unspecified bronchus or lung: Secondary | ICD-10-CM | POA: Diagnosis not present

## 2024-03-14 DIAGNOSIS — C3411 Malignant neoplasm of upper lobe, right bronchus or lung: Secondary | ICD-10-CM | POA: Diagnosis not present

## 2024-03-14 DIAGNOSIS — C771 Secondary and unspecified malignant neoplasm of intrathoracic lymph nodes: Secondary | ICD-10-CM | POA: Diagnosis not present

## 2024-03-15 DIAGNOSIS — C771 Secondary and unspecified malignant neoplasm of intrathoracic lymph nodes: Secondary | ICD-10-CM | POA: Diagnosis not present

## 2024-03-15 DIAGNOSIS — C3411 Malignant neoplasm of upper lobe, right bronchus or lung: Secondary | ICD-10-CM | POA: Diagnosis not present

## 2024-03-15 DIAGNOSIS — C3490 Malignant neoplasm of unspecified part of unspecified bronchus or lung: Secondary | ICD-10-CM | POA: Diagnosis not present

## 2024-03-15 DIAGNOSIS — C3491 Malignant neoplasm of unspecified part of right bronchus or lung: Secondary | ICD-10-CM | POA: Diagnosis not present

## 2024-03-15 DIAGNOSIS — R599 Enlarged lymph nodes, unspecified: Secondary | ICD-10-CM | POA: Diagnosis not present

## 2024-03-18 DIAGNOSIS — C3490 Malignant neoplasm of unspecified part of unspecified bronchus or lung: Secondary | ICD-10-CM | POA: Diagnosis not present

## 2024-03-18 DIAGNOSIS — R599 Enlarged lymph nodes, unspecified: Secondary | ICD-10-CM | POA: Diagnosis not present

## 2024-03-18 DIAGNOSIS — C771 Secondary and unspecified malignant neoplasm of intrathoracic lymph nodes: Secondary | ICD-10-CM | POA: Diagnosis not present

## 2024-03-18 DIAGNOSIS — C3411 Malignant neoplasm of upper lobe, right bronchus or lung: Secondary | ICD-10-CM | POA: Diagnosis not present

## 2024-03-18 DIAGNOSIS — C3491 Malignant neoplasm of unspecified part of right bronchus or lung: Secondary | ICD-10-CM | POA: Diagnosis not present

## 2024-03-19 DIAGNOSIS — C3411 Malignant neoplasm of upper lobe, right bronchus or lung: Secondary | ICD-10-CM | POA: Diagnosis not present

## 2024-03-19 DIAGNOSIS — C3491 Malignant neoplasm of unspecified part of right bronchus or lung: Secondary | ICD-10-CM | POA: Diagnosis not present

## 2024-03-21 ENCOUNTER — Ambulatory Visit: Payer: Medicare HMO | Admitting: Internal Medicine

## 2024-03-25 DIAGNOSIS — C3491 Malignant neoplasm of unspecified part of right bronchus or lung: Secondary | ICD-10-CM | POA: Diagnosis not present

## 2024-03-26 DIAGNOSIS — C3491 Malignant neoplasm of unspecified part of right bronchus or lung: Secondary | ICD-10-CM | POA: Diagnosis not present

## 2024-03-26 DIAGNOSIS — C3411 Malignant neoplasm of upper lobe, right bronchus or lung: Secondary | ICD-10-CM | POA: Diagnosis not present

## 2024-03-28 ENCOUNTER — Encounter: Payer: Self-pay | Admitting: Family Medicine

## 2024-03-28 ENCOUNTER — Ambulatory Visit (INDEPENDENT_AMBULATORY_CARE_PROVIDER_SITE_OTHER): Admitting: Family Medicine

## 2024-03-28 ENCOUNTER — Ambulatory Visit: Admitting: Family Medicine

## 2024-03-28 VITALS — BP 77/54 | HR 83 | Temp 97.5°F | Ht 70.0 in | Wt 264.0 lb

## 2024-03-28 DIAGNOSIS — C3491 Malignant neoplasm of unspecified part of right bronchus or lung: Secondary | ICD-10-CM | POA: Diagnosis not present

## 2024-03-28 DIAGNOSIS — I959 Hypotension, unspecified: Secondary | ICD-10-CM | POA: Diagnosis not present

## 2024-03-28 DIAGNOSIS — K760 Fatty (change of) liver, not elsewhere classified: Secondary | ICD-10-CM | POA: Diagnosis not present

## 2024-03-28 DIAGNOSIS — R748 Abnormal levels of other serum enzymes: Secondary | ICD-10-CM | POA: Diagnosis not present

## 2024-03-28 DIAGNOSIS — C3411 Malignant neoplasm of upper lobe, right bronchus or lung: Secondary | ICD-10-CM | POA: Diagnosis not present

## 2024-03-28 DIAGNOSIS — E790 Hyperuricemia without signs of inflammatory arthritis and tophaceous disease: Secondary | ICD-10-CM

## 2024-03-28 DIAGNOSIS — I10 Essential (primary) hypertension: Secondary | ICD-10-CM

## 2024-03-28 DIAGNOSIS — J449 Chronic obstructive pulmonary disease, unspecified: Secondary | ICD-10-CM | POA: Diagnosis not present

## 2024-03-28 DIAGNOSIS — R7989 Other specified abnormal findings of blood chemistry: Secondary | ICD-10-CM | POA: Diagnosis not present

## 2024-03-28 DIAGNOSIS — R161 Splenomegaly, not elsewhere classified: Secondary | ICD-10-CM | POA: Diagnosis not present

## 2024-03-28 MED ORDER — TAMSULOSIN HCL 0.4 MG PO CAPS: ORAL_CAPSULE | ORAL | 2 refills | Status: DC

## 2024-03-28 MED ORDER — COLCHICINE 0.6 MG PO TABS: ORAL_TABLET | ORAL | 0 refills | Status: DC

## 2024-03-28 MED ORDER — ALLOPURINOL 100 MG PO TABS
ORAL_TABLET | ORAL | 0 refills | Status: DC
Start: 2024-03-28 — End: 2024-05-06

## 2024-03-28 NOTE — Progress Notes (Signed)
 Subjective:  Patient ID: Juan Merritt, male    DOB: 11/08/1951  Age: 73 y.o. MRN: [id number]  CC: Medical Management of Chronic Issues (Following up and BP cause it is dropping during chemo and radiation.  Taking two BP meds. BP low at home as well. ) and US  (Pt had US  today due to elevated liver enzymes. )   HPI Juan Merritt presents for  follow-up of hypertension. He denies symptoms of TIA such as focal numbness or weakness. Patient noted to have hypotension. Going through both chemo and radiation concurrently. This has caused pressure to drop.    History [name] has a past medical history of Arthritis, BPH associated with nocturia, Cancer (HCC), Chronic gout, COPD (chronic obstructive pulmonary disease) (HCC), History of colon polyps, History of COVID-19, Hyperlipidemia, mixed, Hypertension, Phimosis, and Type 2 diabetes mellitus (HCC).   He has a past surgical history that includes Laparoscopic cholecystectomy (2008); Tonsillectomy; Colonoscopy (12/01/2021); Circumcision (N/A, 05/19/2022); Endobronchial ultrasound (01/22/2024); Bronchial needle aspiration biopsy (01/22/2024); Bronchial biopsy (01/22/2024); Bronchial brushings (01/22/2024); Video bronchoscopy with radial endobronchial ultrasound (01/22/2024); and Fine needle aspiration (01/22/2024).   His family history includes Asthma in his mother; Diabetes in his father; Esophageal cancer in his paternal uncle; Heart attack in an other family member; Heart attack (age of onset: 81) in his father; Leukemia in his brother; Lung disease in an other family member.He reports that he has been smoking cigarettes. He started smoking about 47 years ago. He has a 10 pack-year smoking history. His smokeless tobacco use includes chew. He reports current alcohol use of about 24.0 standard drinks of alcohol per week. He reports that he does not use drugs.  Current Outpatient Medications on File Prior to Visit  Medication Sig Dispense Refill   albuterol  (VENTOLIN HFA) 108 (90 Base) MCG/ACT inhaler INHALE 2 PUFFS INTO THE LUNGS TWICE A DAY 18 each 5   aspirin EC 81 MG tablet Take 81 mg by mouth daily.     budesonide-formoterol (SYMBICORT) 160-4.5 MCG/ACT inhaler 2 puffs first thing in am and then another 2 puffs about 12 hours later. 1 each 6   Continuous Blood Gluc Sensor (FREESTYLE LIBRE 2 SENSOR) MISC USE TO TEST BLOOD SUGAR CONTINUOUSLY. Dx. E11.65 SEND TO [facility] FOR MEDICARE COVERAGE, DO NOT SEND TO LOCAL PHARMACY 2 each 0   ipratropium-albuterol (DUONEB) 0.5-2.5 (3) MG/3ML SOLN Take 3 mLs by nebulization every 4 (four) hours as needed (SOB).     meloxicam (MOBIC) 15 MG tablet TAKE 1 TABLET (15 MG TOTAL) BY MOUTH DAILY. 90 tablet 0   metFORMIN (GLUCOPHAGE-XR) 750 MG 24 hr tablet Take 1 tablet (750 mg total) by mouth daily with breakfast. 90 tablet 3   potassium chloride SA (KLOR-CON M20) 20 MEQ tablet TAKE 1 TABLET (20 MEQ TOTAL) DAILY BY MOUTH. 90 tablet 3   No current facility-administered medications on file prior to visit.    ROS Review of Systems  Constitutional:  Negative for fever.  Respiratory:  Negative for shortness of breath.   Cardiovascular:  Negative for chest pain.  Musculoskeletal:  Negative for arthralgias.  Skin:  Negative for rash.    Objective:  BP (!) 77/54   Pulse 83   Temp (!) 97.5 F (36.4 C)   Ht 5\' 10"  (1.778 m)   Wt 264 lb (119.7 kg)   SpO2 95%   BMI 37.88 kg/m   BP Readings from Last 3 Encounters:  03/31/24 (!) 126/92  03/28/24 (!) 77/54  01/31/24 Aaron Aas)  123/55    Wt Readings from Last 3 Encounters:  03/31/24 264 lb (119.7 kg)  03/28/24 264 lb (119.7 kg)  01/31/24 278 lb 6.4 oz (126.3 kg)     Physical Exam Vitals reviewed.  Constitutional:      Appearance: He is well-developed. He is ill-appearing.  HENT:     Head: Normocephalic and atraumatic.     Right Ear: External ear normal.     Left Ear: External ear normal.     Mouth/Throat:     Pharynx: No oropharyngeal exudate or  posterior oropharyngeal erythema.  Eyes:     Pupils: Pupils are equal, round, and reactive to light.  Cardiovascular:     Rate and Rhythm: Normal rate and regular rhythm.     Heart sounds: No murmur heard. Pulmonary:     Effort: No respiratory distress.     Breath sounds: Normal breath sounds.  Musculoskeletal:     Cervical back: Normal range of motion and neck supple.  Neurological:     Mental Status: He is alert and oriented to person, place, and time.       Assessment & Plan:  Hypotension, unspecified hypotension type  Essential hypertension, benign  Hyperuricemia -     Allopurinol; TAKE 1 TABLET (100 MG TOTAL) BY MOUTH DAILY. TO PREVENT GOUT  Dispense: 90 tablet; Refill: 0 -     Uric acid  Malignant neoplasm of upper lobe of right lung (HCC)  Other orders -     Tamsulosin HCl; TAKE 2 CAPSULES BY MOUTH AT BEDTIME FOR URINE FLOW AND PROSTATE  Dispense: 180 capsule; Refill: 2  Pt. To DC BP meds. Monitor BP through daily chemo & radiation visits.   Allergies as of 03/28/2024   No Known Allergies      Medication List        Accurate as of March 28, 2024 11:59 PM. If you have any questions, ask your nurse or doctor.          STOP taking these medications    losartan 100 MG tablet Commonly known as: COZAAR Stopped by: Amariana Mirando       TAKE these medications    albuterol 108 (90 Base) MCG/ACT inhaler Commonly known as: VENTOLIN HFA INHALE 2 PUFFS INTO THE LUNGS TWICE A DAY   allopurinol 100 MG tablet Commonly known as: ZYLOPRIM TAKE 1 TABLET (100 MG TOTAL) BY MOUTH DAILY. TO PREVENT GOUT   amLODipine 5 MG tablet Commonly known as: NORVASC TAKE 1 TABLET (5 MG TOTAL) BY MOUTH DAILY.   aspirin EC 81 MG tablet Take 81 mg by mouth daily.   Breztri Aerosphere 160-9-4.8 MCG/ACT Aero inhaler Generic drug: budeson-glycopyrrolate-formoterol Take 2 puffs first thing in am and then another 2 puffs about 12 hours later.   budesonide-formoterol 160-4.5  MCG/ACT inhaler Commonly known as: Symbicort 2 puffs first thing in am and then another 2 puffs about 12 hours later.   colchicine 0.6 MG tablet Take twice daily for gout attack. (may take every two hours up to 6 doses at acute onset)   FreeStyle Libre 2 Sensor Misc USE TO TEST BLOOD SUGAR CONTINUOUSLY. Dx. E11.65 SEND TO [facility] FOR MEDICARE COVERAGE, DO NOT SEND TO LOCAL PHARMACY   ipratropium-albuterol 0.5-2.5 (3) MG/3ML Soln Commonly known as: DUONEB Take 3 mLs by nebulization every 4 (four) hours as needed (SOB).   meloxicam 15 MG tablet Commonly known as: MOBIC TAKE 1 TABLET (15 MG TOTAL) BY MOUTH DAILY.   metFORMIN 750 MG 24  hr tablet Commonly known as: GLUCOPHAGE-XR Take 1 tablet (750 mg total) by mouth daily with breakfast.   nicotine 21 mg/24hr patch Commonly known as: Nicoderm CQ Place 1 patch (21 mg total) onto the skin daily.   potassium chloride SA 20 MEQ tablet Commonly known as: [place]-Con M20 TAKE 1 TABLET (20 MEQ TOTAL) DAILY BY MOUTH.   tamsulosin 0.4 MG Caps capsule Commonly known as: FLOMAX TAKE 2 CAPSULES BY MOUTH AT BEDTIME FOR URINE FLOW AND PROSTATE   torsemide 100 MG tablet Commonly known as: DEMADEX Take 1 tablet (100 mg total) by mouth daily. At arising and at lunch         Follow-up: Return in about 1 month (around 04/27/2024) for diabetes, hypertension.  Roise Cleaver, M.D.

## 2024-03-28 NOTE — Patient Instructions (Signed)
 Stop the blood pressure medications, losartan and amlodipine. Blood pressure should go up some. If it goes over 140 systolic (first number) consistently, make an appointment to see me. If it goes over 180 restart the amlodipine and come see me.

## 2024-03-31 ENCOUNTER — Emergency Department (HOSPITAL_COMMUNITY)

## 2024-03-31 ENCOUNTER — Other Ambulatory Visit: Payer: Self-pay

## 2024-03-31 ENCOUNTER — Observation Stay (HOSPITAL_COMMUNITY)
Admission: EM | Admit: 2024-03-31 | Discharge: 2024-03-31 | Disposition: A | Discharge status: Home / Self Care | Attending: Emergency Medicine | Admitting: Emergency Medicine

## 2024-03-31 ENCOUNTER — Encounter (HOSPITAL_COMMUNITY): Payer: Self-pay

## 2024-03-31 DIAGNOSIS — E119 Type 2 diabetes mellitus without complications: Secondary | ICD-10-CM | POA: Insufficient documentation

## 2024-03-31 DIAGNOSIS — E790 Hyperuricemia without signs of inflammatory arthritis and tophaceous disease: Secondary | ICD-10-CM | POA: Diagnosis present

## 2024-03-31 DIAGNOSIS — I959 Hypotension, unspecified: Secondary | ICD-10-CM | POA: Diagnosis not present

## 2024-03-31 DIAGNOSIS — C3411 Malignant neoplasm of upper lobe, right bronchus or lung: Secondary | ICD-10-CM | POA: Diagnosis not present

## 2024-03-31 DIAGNOSIS — Z7984 Long term (current) use of oral hypoglycemic drugs: Secondary | ICD-10-CM | POA: Insufficient documentation

## 2024-03-31 DIAGNOSIS — A419 Sepsis, unspecified organism: Secondary | ICD-10-CM | POA: Diagnosis not present

## 2024-03-31 DIAGNOSIS — Z7901 Long term (current) use of anticoagulants: Secondary | ICD-10-CM | POA: Diagnosis not present

## 2024-03-31 DIAGNOSIS — I700 Atherosclerosis of aorta: Secondary | ICD-10-CM | POA: Diagnosis not present

## 2024-03-31 DIAGNOSIS — F17210 Nicotine dependence, cigarettes, uncomplicated: Secondary | ICD-10-CM | POA: Diagnosis not present

## 2024-03-31 DIAGNOSIS — J432 Centrilobular emphysema: Secondary | ICD-10-CM | POA: Diagnosis not present

## 2024-03-31 DIAGNOSIS — R531 Weakness: Secondary | ICD-10-CM

## 2024-03-31 DIAGNOSIS — E860 Dehydration: Secondary | ICD-10-CM | POA: Diagnosis present

## 2024-03-31 DIAGNOSIS — R509 Fever, unspecified: Secondary | ICD-10-CM | POA: Diagnosis not present

## 2024-03-31 DIAGNOSIS — Z79899 Other long term (current) drug therapy: Secondary | ICD-10-CM | POA: Diagnosis not present

## 2024-03-31 DIAGNOSIS — I152 Hypertension secondary to endocrine disorders: Secondary | ICD-10-CM | POA: Diagnosis present

## 2024-03-31 DIAGNOSIS — R000 Tachycardia, unspecified: Secondary | ICD-10-CM | POA: Diagnosis not present

## 2024-03-31 DIAGNOSIS — C3490 Malignant neoplasm of unspecified part of unspecified bronchus or lung: Secondary | ICD-10-CM | POA: Diagnosis not present

## 2024-03-31 DIAGNOSIS — I10 Essential (primary) hypertension: Secondary | ICD-10-CM | POA: Diagnosis not present

## 2024-03-31 DIAGNOSIS — Z1152 Encounter for screening for COVID-19: Secondary | ICD-10-CM | POA: Diagnosis not present

## 2024-03-31 DIAGNOSIS — E1159 Type 2 diabetes mellitus with other circulatory complications: Secondary | ICD-10-CM | POA: Diagnosis present

## 2024-03-31 DIAGNOSIS — J449 Chronic obstructive pulmonary disease, unspecified: Secondary | ICD-10-CM | POA: Diagnosis present

## 2024-03-31 HISTORY — DX: Malignant (primary) neoplasm, unspecified: C80.1

## 2024-03-31 LAB — URINALYSIS, W/ REFLEX TO CULTURE (INFECTION SUSPECTED)
Bacteria, UA: NONE SEEN
Bilirubin Urine: NEGATIVE
Glucose, UA: NEGATIVE mg/dL
Hgb urine dipstick: NEGATIVE
Ketones, ur: NEGATIVE mg/dL
Leukocytes,Ua: NEGATIVE
Nitrite: NEGATIVE
Protein, ur: NEGATIVE mg/dL
Specific Gravity, Urine: 1.014 (ref 1.005–1.030)
pH: 7 (ref 5.0–8.0)

## 2024-03-31 LAB — COMPREHENSIVE METABOLIC PANEL WITH GFR
ALT: 129 U/L — ABNORMAL HIGH (ref 0–44)
AST: 80 U/L — ABNORMAL HIGH (ref 15–41)
Albumin: 3.1 g/dL — ABNORMAL LOW (ref 3.5–5.0)
Alkaline Phosphatase: 138 U/L — ABNORMAL HIGH (ref 38–126)
Anion gap: 9 (ref 5–15)
BUN: 28 mg/dL — ABNORMAL HIGH (ref 8–23)
CO2: 27 mmol/L (ref 22–32)
Calcium: 9.1 mg/dL (ref 8.9–10.3)
Chloride: 99 mmol/L (ref 98–111)
Creatinine, Ser: 1.17 mg/dL (ref 0.61–1.24)
GFR, Estimated: >60 mL/min (ref >60)
Glucose, Bld: 135 mg/dL — ABNORMAL HIGH (ref 70–99)
Potassium: 3.9 mmol/L (ref 3.5–5.1)
Sodium: 135 mmol/L (ref 135–145)
Total Bilirubin: 1.6 mg/dL — ABNORMAL HIGH (ref 0.0–1.2)
Total Protein: 7.3 g/dL (ref 6.5–8.1)

## 2024-03-31 LAB — CBC WITH DIFFERENTIAL/PLATELET
Abs Immature Granulocytes: 0.04 10*3/uL (ref 0.00–0.07)
Basophils Absolute: 0 10*3/uL (ref 0.0–0.1)
Basophils Relative: 0 %
Eosinophils Absolute: 0.1 10*3/uL (ref 0.0–0.5)
Eosinophils Relative: 1 %
HCT: 32.8 % — ABNORMAL LOW (ref 39.0–52.0)
Hemoglobin: 11 g/dL — ABNORMAL LOW (ref 13.0–17.0)
Immature Granulocytes: 1 %
Lymphocytes Relative: 8 %
Lymphs Abs: 0.6 10*3/uL — ABNORMAL LOW (ref 0.7–4.0)
MCH: 33.5 pg (ref 26.0–34.0)
MCHC: 33.5 g/dL (ref 30.0–36.0)
MCV: 100 fL (ref 80.0–100.0)
Monocytes Absolute: 0.8 10*3/uL (ref 0.1–1.0)
Monocytes Relative: 11 %
Neutro Abs: 6 10*3/uL (ref 1.7–7.7)
Neutrophils Relative %: 79 %
Platelets: 185 10*3/uL (ref 150–400)
RBC: 3.28 MIL/uL — ABNORMAL LOW (ref 4.22–5.81)
RDW: 15.7 % — ABNORMAL HIGH (ref 11.5–15.5)
WBC: 7.6 10*3/uL (ref 4.0–10.5)
nRBC: 0 % (ref 0.0–0.2)

## 2024-03-31 LAB — LACTIC ACID, PLASMA
Lactic Acid, Venous: 0.9 mmol/L (ref 0.5–1.9)
Lactic Acid, Venous: 1 mmol/L (ref 0.5–1.9)

## 2024-03-31 LAB — BLOOD GAS, VENOUS
Acid-Base Excess: 3.3 mmol/L — ABNORMAL HIGH (ref 0.0–2.0)
Bicarbonate: 27.8 mmol/L (ref 20.0–28.0)
Drawn by: 27160
O2 Saturation: 87.6 %
Patient temperature: 37.7
pCO2, Ven: 42 mmHg — ABNORMAL LOW (ref 44–60)
pH, Ven: 7.43 (ref 7.25–7.43)
pO2, Ven: 58 mmHg — ABNORMAL HIGH (ref 32–45)

## 2024-03-31 LAB — RESP PANEL BY RT-PCR (RSV, FLU A&B, COVID)  RVPGX2
Influenza A by PCR: NEGATIVE
Influenza B by PCR: NEGATIVE
Resp Syncytial Virus by PCR: NEGATIVE
SARS Coronavirus 2 by RT PCR: NEGATIVE

## 2024-03-31 LAB — TROPONIN I (HIGH SENSITIVITY)
Troponin I (High Sensitivity): 8 ng/L (ref <18)
Troponin I (High Sensitivity): 7 ng/L (ref <18)

## 2024-03-31 LAB — MAGNESIUM: Magnesium: 2.1 mg/dL (ref 1.7–2.4)

## 2024-03-31 LAB — PROCALCITONIN: Procalcitonin: 0.41 ng/mL

## 2024-03-31 LAB — BRAIN NATRIURETIC PEPTIDE: B Natriuretic Peptide: 34 pg/mL (ref 0.0–100.0)

## 2024-03-31 MED ORDER — VANCOMYCIN HCL 2000 MG/400ML IV SOLN
2000.0000 mg | Freq: Once | INTRAVENOUS | Status: AC
  Administered 2024-03-31: 2000 mg via INTRAVENOUS
  Filled 2024-03-31: qty 400

## 2024-03-31 MED ORDER — COLCHICINE 0.6 MG PO TABS: 0.6000 mg | ORAL_TABLET | ORAL | Status: DC

## 2024-03-31 MED ORDER — LACTATED RINGERS IV SOLN: INTRAVENOUS | Status: DC

## 2024-03-31 MED ORDER — METRONIDAZOLE 500 MG/100ML IV SOLN
500.0000 mg | Freq: Once | INTRAVENOUS | Status: AC
  Administered 2024-03-31: 500 mg via INTRAVENOUS
  Filled 2024-03-31: qty 100

## 2024-03-31 MED ORDER — DOXYCYCLINE HYCLATE 100 MG PO CAPS: 100.0000 mg | ORAL_CAPSULE | Freq: Two times a day (BID) | ORAL | 0 refills | Status: AC

## 2024-03-31 MED ORDER — IPRATROPIUM-ALBUTEROL 0.5-2.5 (3) MG/3ML IN SOLN
3.0000 mL | Freq: Once | RESPIRATORY_TRACT | Status: AC
Start: 2024-03-31 — End: 2024-03-31
  Administered 2024-03-31: 3 mL via RESPIRATORY_TRACT
  Filled 2024-03-31: qty 3

## 2024-03-31 MED ORDER — IOHEXOL 350 MG/ML SOLN
100.0000 mL | Freq: Once | INTRAVENOUS | Status: AC | PRN
  Administered 2024-03-31: 100 mL via INTRAVENOUS

## 2024-03-31 MED ORDER — SODIUM CHLORIDE 0.9 % IV BOLUS
1000.0000 mL | Freq: Once | INTRAVENOUS | Status: AC
  Administered 2024-03-31: 1000 mL via INTRAVENOUS

## 2024-03-31 MED ORDER — SODIUM CHLORIDE 0.9 % IV SOLN
2.0000 g | Freq: Once | INTRAVENOUS | Status: AC
  Administered 2024-03-31: 2 g via INTRAVENOUS
  Filled 2024-03-31: qty 12.5

## 2024-03-31 MED ORDER — VANCOMYCIN HCL IN DEXTROSE 1-5 GM/200ML-% IV SOLN
1000.0000 mg | Freq: Once | INTRAVENOUS | Status: DC
  Filled 2024-03-31: qty 200

## 2024-03-31 MED ORDER — ALBUTEROL SULFATE (2.5 MG/3ML) 0.083% IN NEBU
INHALATION_SOLUTION | RESPIRATORY_TRACT | Status: AC
  Administered 2024-03-31: 2.5 mg
  Filled 2024-03-31: qty 3

## 2024-03-31 MED ORDER — LACTATED RINGERS IV BOLUS (SEPSIS)
1000.0000 mL | Freq: Once | INTRAVENOUS | Status: AC
  Administered 2024-03-31: 1000 mL via INTRAVENOUS

## 2024-03-31 NOTE — ED Notes (Signed)
Pt feeding himself breakfast.

## 2024-03-31 NOTE — Discharge Instructions (Signed)
IMPORTANT INFORMATION: PAY CLOSE ATTENTION  ? ?PHYSICIAN DISCHARGE INSTRUCTIONS ? ?Follow with Primary care provider  Stacks, Warren, MD  and other consultants as instructed by your Hospitalist Physician ? ?SEEK MEDICAL CARE OR RETURN TO EMERGENCY ROOM IF SYMPTOMS COME BACK, WORSEN OR NEW PROBLEM DEVELOPS  ? ?Please note: ?You were cared for by a hospitalist during your hospital stay. Every effort will be made to forward records to your primary care provider.  You can request that your primary care provider send for your hospital records if they have not received them.  Once you are discharged, your primary care physician will handle any further medical issues. Please note that NO REFILLS for any discharge medications will be authorized once you are discharged, as it is imperative that you return to your primary care physician (or establish a relationship with a primary care physician if you do not have one) for your post hospital discharge needs so that they can reassess your need for medications and monitor your lab values. ? ?Please get a complete blood count and chemistry panel checked by your Primary MD at your next visit, and again as instructed by your Primary MD. ? ?Get Medicines reviewed and adjusted: ?Please take all your medications with you for your next visit with your Primary MD ? ?Laboratory/radiological data: ?Please request your Primary MD to go over all hospital tests and procedure/radiological results at the follow up, please ask your primary care provider to get all Hospital records sent to his/her office. ? ?In some cases, they will be blood work, cultures and biopsy results pending at the time of your discharge. Please request that your primary care provider follow up on these results. ? ?If you are diabetic, please bring your blood sugar readings with you to your follow up appointment with primary care.   ? ?Please call and make your follow up appointments as soon as possible.   ? ?Also Note  the following: ?If you experience worsening of your admission symptoms, develop shortness of breath, life threatening emergency, suicidal or homicidal thoughts you must seek medical attention immediately by calling 911 or calling your MD immediately  if symptoms less severe. ? ?You must read complete instructions/literature along with all the possible adverse reactions/side effects for all the Medicines you take and that have been prescribed to you. Take any new Medicines after you have completely understood and accpet all the possible adverse reactions/side effects.  ? ?Do not drive when taking Pain medications or sleeping medications (Benzodiazepines) ? ?Do not take more than prescribed Pain, Sleep and Anxiety Medications. It is not advisable to combine anxiety,sleep and pain medications without talking with your primary care practitioner ? ?Special Instructions: If you have smoked or chewed Tobacco  in the last 2 yrs please stop smoking, stop any regular Alcohol  and or any Recreational drug use. ? ?Wear Seat belts while driving.  Do not drive if taking any narcotic, mind altering or controlled substances or recreational drugs or alcohol.  ? ? ? ? ? ?

## 2024-03-31 NOTE — ED Provider Notes (Addendum)
 [facility] AT [facility] Provider Note   CSN: [id number] Arrival date & time: 03/31/24  4098     History  Chief Complaint  Patient presents with   Weakness    Juan Merritt is a 73 y.o. male.   Weakness Associated symptoms: fever   Patient presents for fever, chills, generalized weakness.  Medical history includes COPD, BPH, arthritis, gout, HTN, HLD, DM, lung cancer.  He is currently on weekly Taxol and carboplatin as well as radiation therapy.  He underwent radiation therapy on Tuesday.  He had fatigue and malaise afterward, which is common for him.  He was recently found to have elevated liver enzymes and they held off on his Taxol.  He underwent a liver ultrasound, the results of which he does not know.  Starting today, he had fevers, chills, worsened fatigue.  His SpO2 was in the low 90s.  Typically, he will do nebulized albuterol at home as needed.  He is not on oxygen at baseline.  He was seen by PCP 3 days ago with concern of hypotension.  He was advised to hold off on his blood pressure medications at the time.     Home Medications Prior to Admission medications   Medication Sig Start Date End Date Taking? Authorizing Provider  albuterol (VENTOLIN HFA) 108 (90 Base) MCG/ACT inhaler INHALE 2 PUFFS INTO THE LUNGS TWICE A DAY 03/04/24   Roise Cleaver, MD  allopurinol (ZYLOPRIM) 100 MG tablet TAKE 1 TABLET (100 MG TOTAL) BY MOUTH DAILY. TO PREVENT GOUT 03/28/24   Roise Cleaver, MD  amLODipine (NORVASC) 5 MG tablet TAKE 1 TABLET (5 MG TOTAL) BY MOUTH DAILY. 03/04/24   Roise Cleaver, MD  aspirin EC 81 MG tablet Take 81 mg by mouth daily. Swallow whole.    [provider]  Budeson-Glycopyrrol-Formoterol (BREZTRI AEROSPHERE) 160-9-4.8 MCG/ACT AERO Take 2 puffs first thing in am and then another 2 puffs about 12 hours later. 12/01/23   Diamond Formica, MD  budesonide-formoterol (SYMBICORT) 160-4.5 MCG/ACT inhaler 2 puffs first thing in am  and then another 2 puffs about 12 hours later. 02/14/24   Diamond Formica, MD  colchicine 0.6 MG tablet Take twice daily for gout attack. (may take every two hours up to 6 doses at acute onset) 03/28/24   Roise Cleaver, MD  Continuous Blood Gluc Sensor (FREESTYLE LIBRE 2 SENSOR) MISC USE TO TEST BLOOD SUGAR CONTINUOUSLY. Dx. E11.65 SEND TO [facility] FOR MEDICARE COVERAGE, DO NOT SEND TO LOCAL PHARMACY 06/16/21   Roise Cleaver, MD  ipratropium-albuterol (DUONEB) 0.5-2.5 (3) MG/3ML SOLN Take 3 mLs by nebulization every 4 (four) hours as needed (SOB). 01/22/24   Byrum, Robert S, MD  meloxicam (MOBIC) 15 MG tablet TAKE 1 TABLET (15 MG TOTAL) BY MOUTH DAILY. 02/09/24   Roise Cleaver, MD  metFORMIN (GLUCOPHAGE-XR) 750 MG 24 hr tablet Take 1 tablet (750 mg total) by mouth daily with breakfast. 09/20/23   Roise Cleaver, MD  nicotine (NICODERM CQ) 21 mg/24hr patch Place 1 patch (21 mg total) onto the skin daily. 01/11/24   Roise Cleaver, MD  potassium chloride SA (KLOR-CON M20) 20 MEQ tablet TAKE 1 TABLET (20 MEQ TOTAL) DAILY BY MOUTH. 09/20/23   Roise Cleaver, MD  tamsulosin (FLOMAX) 0.4 MG CAPS capsule TAKE 2 CAPSULES BY MOUTH AT BEDTIME FOR URINE FLOW AND PROSTATE 03/28/24   Roise Cleaver, MD  torsemide (DEMADEX) 100 MG tablet Take 1 tablet (100 mg total) by mouth daily. At arising and  at lunch 01/22/24   Denson Flake, MD      Allergies    Patient has no known allergies.    Review of Systems   Review of Systems  Constitutional:  Positive for activity change, chills, fatigue and fever.  Neurological:  Positive for weakness (Generalized).  All other systems reviewed and are negative.   Physical Exam Updated Vital Signs BP 128/75   Pulse (!) 116   Temp 99.8 F (37.7 C)   Resp 18   Ht 5\' 10"  (1.778 m)   Wt 119.7 kg   SpO2 92%   BMI 37.88 kg/m  Physical Exam Vitals and nursing note reviewed.  Constitutional:      General: He is not in acute distress.    Appearance: Normal appearance.  He is well-developed. He is not toxic-appearing or diaphoretic.  HENT:     Head: Normocephalic and atraumatic.     Right Ear: External ear normal.     Left Ear: External ear normal.     Nose: Nose normal.     Mouth/Throat:     Mouth: Mucous membranes are moist.  Eyes:     Extraocular Movements: Extraocular movements intact.     Conjunctiva/sclera: Conjunctivae normal.  Cardiovascular:     Rate and Rhythm: Regular rhythm. Tachycardia present.     Heart sounds: No murmur heard. Pulmonary:     Effort: Pulmonary effort is normal. No respiratory distress.     Breath sounds: Decreased breath sounds present. No wheezing, rhonchi or rales.  Abdominal:     General: There is no distension.     Palpations: Abdomen is soft.     Tenderness: There is no abdominal tenderness.  Musculoskeletal:        General: No swelling or deformity.     Cervical back: Normal range of motion and neck supple.  Skin:    General: Skin is warm and dry.     Coloration: Skin is not pale.  Neurological:     General: No focal deficit present.     Mental Status: He is alert and oriented to person, place, and time.  Psychiatric:        Mood and Affect: Mood normal.        Behavior: Behavior normal.     ED Results / Procedures / Treatments   Labs (all labs ordered are listed, but only abnormal results are displayed) Labs Reviewed  COMPREHENSIVE METABOLIC PANEL WITH GFR - Abnormal; Notable for the following components:      Result Value   Glucose, Bld 135 (*)    BUN 28 (*)    Albumin 3.1 (*)    AST 80 (*)    ALT 129 (*)    Alkaline Phosphatase 138 (*)    Total Bilirubin 1.6 (*)    All other components within normal limits  CBC WITH DIFFERENTIAL/PLATELET - Abnormal; Notable for the following components:   RBC 3.28 (*)    Hemoglobin 11.0 (*)    HCT 32.8 (*)    RDW 15.7 (*)    Lymphs Abs 0.6 (*)    All other components within normal limits  RESP PANEL BY RT-PCR (RSV, FLU A&B, COVID)  RVPGX2  CULTURE,  BLOOD (ROUTINE X 2)  CULTURE, BLOOD (ROUTINE X 2)  LACTIC ACID, PLASMA  LACTIC ACID, PLASMA  BRAIN NATRIURETIC PEPTIDE  BLOOD GAS, VENOUS  URINALYSIS, W/ REFLEX TO CULTURE (INFECTION SUSPECTED)  MAGNESIUM  I-STAT CHEM 8, ED  TROPONIN I (HIGH SENSITIVITY)  TROPONIN I (  HIGH SENSITIVITY)    EKG EKG Interpretation Date/Time:  Sunday March 31 2024 04:52:38 EDT Ventricular Rate:  123 PR Interval:  155 QRS Duration:  99 QT Interval:  298 QTC Calculation: 427 R Axis:   75  Text Interpretation: Sinus tachycardia Nonspecific T abnormalities, inferior leads Confirmed by Iva Mariner [phone]) on 03/31/2024 5:25:09 AM  Radiology DG Chest Port 1 View Result Date: 03/31/2024 CLINICAL DATA:  Non-small-cell lung cancer.  Questionable sepsis. EXAM: PORTABLE CHEST 1 VIEW COMPARISON:  02/16/2024 FINDINGS: No edema or substantial pleural effusion. Streaky opacity at the lung bases suggest atelectasis. The patient's known right apical lesion is similar to prior. Left-sided Port-A-Cath tip overlies the proximal SVC level. No acute bony abnormality. Telemetry leads overlie the chest. IMPRESSION: 1. Streaky bibasilar opacities, likely atelectasis. Superimposed basilar pneumonia cannot be excluded, notably on the left. 2. Known right apical lesion is similar to prior. Electronically Signed   By: Donnal Fusi M.D.   On: 03/31/2024 05:22    Procedures Procedures    Medications Ordered in ED Medications  lactated ringers infusion (has no administration in time range)  metroNIDAZOLE (FLAGYL) IVPB 500 mg (500 mg Intravenous New Bag/Given 03/31/24 0601)  vancomycin (VANCOREADY) IVPB 2000 mg/400 mL (2,000 mg Intravenous New Bag/Given 03/31/24 0603)  lactated ringers bolus 1,000 mL (1,000 mLs Intravenous New Bag/Given 03/31/24 0528)  ceFEPIme (MAXIPIME) 2 g in sodium chloride 0.9 % 100 mL IVPB (0 g Intravenous Stopped 03/31/24 0559)  ipratropium-albuterol (DUONEB) 0.5-2.5 (3) MG/3ML nebulizer solution 3 mL (3 mLs  Nebulization Given 03/31/24 0615)  albuterol (PROVENTIL) (2.5 MG/3ML) 0.083% nebulizer solution (2.5 mg  Given 03/31/24 0615)  iohexol (OMNIPAQUE) 350 MG/ML injection 100 mL (100 mLs Intravenous Contrast Given 03/31/24 7846)    ED Course/ Medical Decision Making/ A&P                                 Medical Decision Making Amount and/or Complexity of Data Reviewed Labs: ordered. Radiology: ordered.  Risk Prescription drug management.   This patient presents to the ED for concern of generalized weakness, fever, chills, this involves an extensive number of treatment options, and is a complaint that carries with it a high risk of complications and morbidity.  The differential diagnosis includes sepsis, viral infection, chemotherapy reaction, deconditioning, metabolic derangements   Co morbidities that complicate the patient evaluation  COPD, BPH, arthritis, gout, HTN, HLD, DM, lung cancer   Additional history obtained:  Additional history obtained from patient's wife External records from outside source obtained and reviewed including EMR   Lab Tests:  I Ordered, and personally interpreted labs.  The pertinent results include: Stable elevation in hepatobiliary enzymes, baseline anemia, no leukocytosis, normal lactate   Imaging Studies ordered:  I ordered imaging studies including chest x-ray, CT chest, CT of abdomen and pelvis I independently visualized and interpreted imaging which showed x-ray shows concern of left basilar pneumonia; CT imaging pending at time of signout I agree with the radiologist interpretation   Cardiac Monitoring: / EKG:  The patient was maintained on a cardiac monitor.  I personally viewed and interpreted the cardiac monitored which showed an underlying rhythm of: Sinus rhythm   Problem List / ED Course / Critical interventions / Medication management  Patient presenting for generalized weakness, fevers, chills.  Found to have fever of 101.1  degrees at home.  He did take some Tylenol at 6:30 PM.  He is followed by  [facility] hem/onc, and is currently undergoing chemotherapy and radiation for lung cancer.  On arrival in the ED, he is tachycardic in the range of 120. Current breathing is unlabored.  SpO2 is 91% on room air.  He is alert and oriented.  He has a difficult time even sitting up in the bed, due to his new onset of generalized weakness.  His wife feels that he may have developed some jaundiced skin color.  This is difficult for me to appreciate.  He has been found to have elevated liver enzymes on recent outpatient lab work.  Oral temperature was 99.8 degrees.  Patient refused rectal temperature.  Presentation today is concerning for sepsis.  Workup and treatment were initiated.  Lab work shows stable/slightly improved elevation in hepatobiliary enzymes.  Notably, no leukocytosis or lactic acidosis are present.  X-ray does show concern for left basilar pneumonia.  CT imaging studies will further evaluate.  CT scans were pending at time of signout.  Care of patient was signed out to oncoming ED provider. I ordered medication including IV fluids and broad-spectrum antibiotics for.  Treatment of sepsis; DuoNeb for COPD Reevaluation of the patient after these medicines showed that the patient improved I have reviewed the patients home medicines and have made adjustments as needed  Social Determinants of Health:  Has access to outpatient care        Final Clinical Impression(s) / ED Diagnoses Final diagnoses:  Fever in adult    Rx / DC Orders ED Discharge Orders     None         Iva Mariner, MD 03/31/24 1610    Iva Mariner, MD 03/31/24 [phone]

## 2024-03-31 NOTE — ED Triage Notes (Signed)
 Pt from home with wife, cancer patient. States he has been feeling bad since Wednesday. Last chemo Tuesday, did not receive one of his chemo drugs due to elevated LFTs. Usually feels bad about 4 days after treatment. Wife brought him in due to running a fever (101.1) and tachycardia at home. 99.8 in triage (last tylenol at 1800 yesterday). Fever, chills, weakness, tachycardia (125 bpm) at home.

## 2024-03-31 NOTE — Consult Note (Addendum)
 Medical consultation note [facility]  Juan Merritt:[id number] DOB: 1951/01/06 DOA: 03/31/2024  PCP: Roise Cleaver, MD  Patient coming from: Home  Level of care: Med-Surg  I have personally briefly reviewed patient's old medical records in [facility]  Requesting physician: Isaiah Marc   Reason for consultation: Evaluation and management recommendations for generalized weakness and fever and a cancer patient receiving active chemotherapy and radiation.  Chief Complaint: weakness  HPI: Juan Merritt is a 73 y.o. male with medical history significant for COPD, gout, stage III non-small cell lung cancer currently on chemotherapy and radiation, history of colon polyps, COPD, BPH with nocturia, osteoarthritis, type 2 diabetes mellitus, hypertension, hyperlipidemia who presented to the emergency department having fever at home.  He had a fever up to 101.  He reports having some chills and weakness.  He said that his weekly Taxol and carboplatin had been held due to small rise in LFTs.  He is due for radiation therapy tomorrow.  He reported that his last radiation treatment was on 03/26/2024 and he started having weakness and chills and fatigue afterwards.  His blood pressures have been low and his providers have told him to hold off taking his home blood pressure medications.  He had a good workup in the ED including a CT of the chest abdomen and pelvis but no findings of infection found.  His white blood cell count was normal at 7.6 and his hemoglobin was 11.0.  His lactic acid was 0.9.  Blood cultures drawn in the ED.  Urinalysis was negative.  Influenza A and B, RSV and SARS coronavirus 2 tests were negative.  Patient was given broad-spectrum antibiotics IV and IV fluids.   Past Medical History:  Diagnosis Date   Arthritis    BPH associated with nocturia    Cancer (HCC)    stage III non-small cell lung cancer.   Chronic gout    COPD (chronic obstructive pulmonary disease)  (HCC)    not on home o2   History of colon polyps    History of COVID-19    04/ 2019 hospital admission in epic, severe sepsis due to covid w/ acute respiratory failure;   and 05/ 2021 mild to moderate symptoms, recovered at home   Hyperlipidemia, mixed    Hypertension    Phimosis    Type 2 diabetes mellitus (HCC)    followed by pcp   (05-17-2022  pt stated checks sugar multiple times daily w/ Libre,  fasting average --- 100-110)    Past Surgical History:  Procedure Laterality Date   BRONCHIAL BIOPSY  01/22/2024   Procedure: BRONCHIAL BIOPSIES;  Surgeon: Denson Flake, MD;  Location: [facility] ENDOSCOPY;  Service: Pulmonary;;   BRONCHIAL BRUSHINGS  01/22/2024   Procedure: BRONCHIAL BRUSHINGS;  Surgeon: Denson Flake, MD;  Location: [facility] ENDOSCOPY;  Service: Pulmonary;;   BRONCHIAL NEEDLE ASPIRATION BIOPSY  01/22/2024   Procedure: BRONCHIAL NEEDLE ASPIRATION BIOPSIES;  Surgeon: Denson Flake, MD;  Location: [facility] ENDOSCOPY;  Service: Pulmonary;;   CIRCUMCISION N/A 05/19/2022   Procedure: CIRCUMCISION ADULT;  Surgeon: Trent Frizzle, MD;  Location: [facility];  Service: Urology;  Laterality: N/A;  45 MINUTES   COLONOSCOPY  12/01/2021   by dr Dominic Friendly   ENDOBRONCHIAL ULTRASOUND  01/22/2024   Procedure: ENDOBRONCHIAL ULTRASOUND;  Surgeon: Denson Flake, MD;  Location: [facility] ENDOSCOPY;  Service: Pulmonary;;   FINE NEEDLE ASPIRATION  01/22/2024   Procedure: FINE NEEDLE ASPIRATION (FNA) LINEAR;  Surgeon: Racheal Buddle  S, MD;  Location: [facility] ENDOSCOPY;  Service: Pulmonary;;   LAPAROSCOPIC CHOLECYSTECTOMY  2008   in [place]   TONSILLECTOMY     child   VIDEO BRONCHOSCOPY WITH RADIAL ENDOBRONCHIAL ULTRASOUND  01/22/2024   Procedure: VIDEO BRONCHOSCOPY WITH RADIAL ENDOBRONCHIAL ULTRASOUND;  Surgeon: Denson Flake, MD;  Location: [facility] ENDOSCOPY;  Service: Pulmonary;;     reports that he has been smoking cigarettes. He started smoking about 47 years ago. He has a 10 pack-year smoking history. His  smokeless tobacco use includes chew. He reports current alcohol use of about 24.0 standard drinks of alcohol per week. He reports that he does not use drugs.  No Known Allergies  Family History  Problem Relation Age of Onset   Asthma Mother    Diabetes Father    Heart attack Father 67   Leukemia Brother    Esophageal cancer Paternal Uncle    Lung disease Other        pat cousin   Heart attack Other        pat cousin   Colon cancer Neg Hx    Stomach cancer Neg Hx     Prior to Admission medications   Medication Sig Start Date End Date Taking? Authorizing Provider  acetaminophen (TYLENOL) 500 MG tablet Take 1,000 mg by mouth 2 (two) times daily as needed for moderate pain (pain score 4-6), fever or headache.   Yes [provider]  allopurinol (ZYLOPRIM) 100 MG tablet TAKE 1 TABLET (100 MG TOTAL) BY MOUTH DAILY. TO PREVENT GOUT 03/28/24  Yes Roise Cleaver, MD  budesonide-formoterol [facility]) 160-4.5 MCG/ACT inhaler 2 puffs first thing in am and then another 2 puffs about 12 hours later. 02/14/24  Yes Diamond Formica, MD  doxycycline (VIBRAMYCIN) 100 MG capsule Take 1 capsule (100 mg total) by mouth 2 (two) times daily for 5 days. 04/01/24 04/06/24 Yes Aarika Moon L, MD  lidocaine-prilocaine (EMLA) cream Apply 1 Application topically every Tuesday.   Yes [provider]  omeprazole (PRILOSEC) 20 MG capsule Take 20 mg by mouth 2 (two) times daily before a meal.   Yes [provider]  ondansetron (ZOFRAN) 8 MG tablet Take 8 mg by mouth every 8 (eight) hours as needed for nausea or vomiting.   Yes [provider]  albuterol (VENTOLIN HFA) 108 (90 Base) MCG/ACT inhaler INHALE 2 PUFFS INTO THE LUNGS TWICE A DAY 03/04/24  Yes Roise Cleaver, MD  aspirin EC 81 MG tablet Take 81 mg by mouth daily.   Yes [provider]  colchicine 0.6 MG tablet Take 1 tablet (0.6 mg total) by mouth See admin instructions. Take 1 tablet (0.6mg ) by mouth every 2 hours  for up to 6 doses as needed for acute gout attack 03/31/24   Faustino Hook L, MD  Continuous Blood Gluc Sensor (FREESTYLE LIBRE 2 SENSOR) MISC USE TO TEST BLOOD SUGAR CONTINUOUSLY. Dx. E11.65 SEND TO [facility] FOR MEDICARE COVERAGE, DO NOT SEND TO LOCAL PHARMACY 06/16/21   Roise Cleaver, MD  ipratropium-albuterol (DUONEB) 0.5-2.5 (3) MG/3ML SOLN Take 3 mLs by nebulization every 4 (four) hours as needed (SOB). 01/22/24  Yes Denson Flake, MD  meloxicam (MOBIC) 15 MG tablet TAKE 1 TABLET (15 MG TOTAL) BY MOUTH DAILY. 02/09/24  Yes Roise Cleaver, MD  metFORMIN (GLUCOPHAGE-XR) 750 MG 24 hr tablet Take 1 tablet (750 mg total) by mouth daily with breakfast. 09/20/23  Yes Stacks, Vallorie Gayer, MD  potassium chloride SA (KLOR-CON M20) 20 MEQ tablet TAKE 1 TABLET (  20 MEQ TOTAL) DAILY BY MOUTH. 09/20/23  Yes Roise Cleaver, MD  tamsulosin (FLOMAX) 0.4 MG CAPS capsule TAKE 2 CAPSULES BY MOUTH AT BEDTIME FOR URINE FLOW AND PROSTATE 03/28/24  Yes Roise Cleaver, MD    Physical Exam: Vitals:   03/31/24 0920 03/31/24 0954 03/31/24 1000 03/31/24 1100  BP: 102/67  (!) 98/50 (!) 126/92  Pulse:   (!) 102 (!) 105  Resp:   12   Temp:  98.4 F (36.9 C)    TempSrc:  Oral    SpO2:   92%   Weight:      Height:       Constitutional: sitting up on gurnee, NAD, calm, comfortable Eyes: PERRL, lids and conjunctivae normal ENMT: Mucous membranes are moist. Posterior pharynx clear of any exudate or lesions. Poor dentition.  Neck: normal, supple, no masses, no thyromegaly Respiratory: clear to auscultation bilaterally, no wheezing, no crackles. Normal respiratory effort. No accessory muscle use.  Cardiovascular: tachycardic at 102, normal s1, s2 sounds, no murmurs / rubs / gallops. No extremity edema. 2+ pedal pulses. No carotid bruits.  Abdomen: morbidly obese, no tenderness, no masses palpated. No hepatosplenomegaly. Bowel sounds positive.  Musculoskeletal: no clubbing / cyanosis. No joint deformity upper and lower  extremities. Good ROM, no contractures. Normal muscle tone.  Skin: no rashes, lesions, ulcers. No induration Neurologic: CN 2-12 grossly intact. Sensation intact, DTR normal. Strength 5/5 in all 4.  Psychiatric: Normal judgment and insight. Alert and oriented x 3. Normal mood.   Labs on Admission: I have personally reviewed following labs and imaging studies  CBC: Recent Labs  Lab 03/31/24 0532  WBC 7.6  NEUTROABS 6.0  HGB 11.0*  HCT 32.8*  MCV 100.0  PLT 185   Basic Metabolic Panel: Recent Labs  Lab 03/31/24 0532 03/31/24 0724  NA 135  --   K 3.9  --   CL 99  --   CO2 27  --   GLUCOSE 135*  --   BUN 28*  --   CREATININE 1.17  --   CALCIUM 9.1  --   MG  --  2.1   GFR: Estimated Creatinine Clearance: 76.2 mL/min (by C-G formula based on SCr of 1.17 mg/dL). Liver Function Tests: Recent Labs  Lab 03/31/24 0532  AST 80*  ALT 129*  ALKPHOS 138*  BILITOT 1.6*  PROT 7.3  ALBUMIN 3.1*   No results for input(s): "LIPASE", "AMYLASE" in the last 168 hours. No results for input(s): "AMMONIA" in the last 168 hours. Coagulation Profile: No results for input(s): "INR", "PROTIME" in the last 168 hours. Cardiac Enzymes: No results for input(s): "CKTOTAL", "CKMB", "CKMBINDEX", "TROPONINI" in the last 168 hours. BNP (last 3 results) No results for input(s): "PROBNP" in the last 8760 hours. HbA1C: No results for input(s): "HGBA1C" in the last 72 hours. CBG: No results for input(s): "GLUCAP" in the last 168 hours. Lipid Profile: No results for input(s): "CHOL", "HDL", "LDLCALC", "TRIG", "CHOLHDL", "LDLDIRECT" in the last 72 hours. Thyroid Function Tests: No results for input(s): "TSH", "T4TOTAL", "FREET4", "T3FREE", "THYROIDAB" in the last 72 hours. Anemia Panel: No results for input(s): "VITAMINB12", "FOLATE", "FERRITIN", "TIBC", "IRON", "RETICCTPCT" in the last 72 hours. Urine analysis:    Component Value Date/Time   COLORURINE YELLOW 03/31/2024 0501   APPEARANCEUR  CLEAR 03/31/2024 0501   APPEARANCEUR Clear 05/12/2021 0912   LABSPEC 1.014 03/31/2024 0501   PHURINE 7.0 03/31/2024 0501   GLUCOSEU NEGATIVE 03/31/2024 0501   HGBUR NEGATIVE 03/31/2024 0501   BILIRUBINUR  NEGATIVE 03/31/2024 0501   BILIRUBINUR Negative 05/12/2021 0912   KETONESUR NEGATIVE 03/31/2024 0501   PROTEINUR NEGATIVE 03/31/2024 0501   NITRITE NEGATIVE 03/31/2024 0501   LEUKOCYTESUR NEGATIVE 03/31/2024 0501    Radiological Exams on Admission: CT Angio Chest PE W and/or Wo Contrast Result Date: 03/31/2024 CLINICAL DATA:  Sepsis. Feeling bad since Wednesday, on chemotherapy EXAM: CT ANGIOGRAPHY CHEST CT ABDOMEN AND PELVIS WITH CONTRAST TECHNIQUE: Multidetector CT imaging of the chest was performed using the standard protocol during bolus administration of intravenous contrast. Multiplanar CT image reconstructions and MIPs were obtained to evaluate the vascular anatomy. Multidetector CT imaging of the abdomen and pelvis was performed using the standard protocol during bolus administration of intravenous contrast. RADIATION DOSE REDUCTION: This exam was performed according to the departmental dose-optimization program which includes automated exposure control, adjustment of the mA and/or kV according to patient size and/or use of iterative reconstruction technique. CONTRAST:  OMNIPAQUE IOHEXOL 350 MG/ML SOLN COMPARISON:  01/16/2024 chest CT FINDINGS: CTA CHEST FINDINGS Cardiovascular: Suboptimal but satisfactory opacification of the pulmonary arteries to the segmental level. No evidence of pulmonary embolism. Normal heart size. No pericardial effusion. Extensive atheromatous calcification of the coronaries. Mediastinum/Nodes: Regression of right paratracheal lymph node which measures 14 mm in length as compared to 19 mm previously. No newly enlarged lymph node Lungs/Pleura: Right upper lobe lobulated and spiculated mass with central cavitation, to 3.2 cm in maximal span on axials, not  measurably different than before. Generalized airway thickening. Centrilobular emphysema. There is no edema, consolidation, effusion, or pneumothorax. Musculoskeletal: No acute or aggressive finding Review of the MIP images confirms the above findings. CT ABDOMEN and PELVIS FINDINGS Hepatobiliary: No focal liver abnormality.Cholecystectomy. No biliary dilatation Pancreas: Unremarkable. Spleen: Unremarkable. Adrenals/Urinary Tract: Negative adrenals. No hydronephrosis or stone. Unremarkable bladder. Stomach/Bowel:  No obstruction. No appendicitis. Vascular/Lymphatic: No acute vascular abnormality. Extensive atheromatous calcification of the aorta and iliacs. No mass or adenopathy. Reproductive:No pathologic findings. Other: No ascites or pneumoperitoneum. Musculoskeletal: No acute abnormalities. Lumbar spine degeneration most notable at L5-S1 disc space. Review of the MIP images confirms the above findings. IMPRESSION: No acute finding in the chest or abdomen. No progressive finding related to the patient's thoracic malignancy. Electronically Signed   By: Ronnette Coke M.D.   On: 03/31/2024 07:14   CT ABDOMEN PELVIS W CONTRAST Result Date: 03/31/2024 CLINICAL DATA:  Sepsis. Feeling bad since Wednesday, on chemotherapy EXAM: CT ANGIOGRAPHY CHEST CT ABDOMEN AND PELVIS WITH CONTRAST TECHNIQUE: Multidetector CT imaging of the chest was performed using the standard protocol during bolus administration of intravenous contrast. Multiplanar CT image reconstructions and MIPs were obtained to evaluate the vascular anatomy. Multidetector CT imaging of the abdomen and pelvis was performed using the standard protocol during bolus administration of intravenous contrast. RADIATION DOSE REDUCTION: This exam was performed according to the departmental dose-optimization program which includes automated exposure control, adjustment of the mA and/or kV according to patient size and/or use of iterative reconstruction technique.  CONTRAST:  OMNIPAQUE IOHEXOL 350 MG/ML SOLN COMPARISON:  01/16/2024 chest CT FINDINGS: CTA CHEST FINDINGS Cardiovascular: Suboptimal but satisfactory opacification of the pulmonary arteries to the segmental level. No evidence of pulmonary embolism. Normal heart size. No pericardial effusion. Extensive atheromatous calcification of the coronaries. Mediastinum/Nodes: Regression of right paratracheal lymph node which measures 14 mm in length as compared to 19 mm previously. No newly enlarged lymph node Lungs/Pleura: Right upper lobe lobulated and spiculated mass with central cavitation, to 3.2 cm in maximal span on axials,  not measurably different than before. Generalized airway thickening. Centrilobular emphysema. There is no edema, consolidation, effusion, or pneumothorax. Musculoskeletal: No acute or aggressive finding Review of the MIP images confirms the above findings. CT ABDOMEN and PELVIS FINDINGS Hepatobiliary: No focal liver abnormality.Cholecystectomy. No biliary dilatation Pancreas: Unremarkable. Spleen: Unremarkable. Adrenals/Urinary Tract: Negative adrenals. No hydronephrosis or stone. Unremarkable bladder. Stomach/Bowel:  No obstruction. No appendicitis. Vascular/Lymphatic: No acute vascular abnormality. Extensive atheromatous calcification of the aorta and iliacs. No mass or adenopathy. Reproductive:No pathologic findings. Other: No ascites or pneumoperitoneum. Musculoskeletal: No acute abnormalities. Lumbar spine degeneration most notable at L5-S1 disc space. Review of the MIP images confirms the above findings. IMPRESSION: No acute finding in the chest or abdomen. No progressive finding related to the patient's thoracic malignancy. Electronically Signed   By: Ronnette Coke M.D.   On: 03/31/2024 07:14   DG Chest Port 1 View Result Date: 03/31/2024 CLINICAL DATA:  Non-small-cell lung cancer.  Questionable sepsis. EXAM: PORTABLE CHEST 1 VIEW COMPARISON:  02/16/2024 FINDINGS: No edema or  substantial pleural effusion. Streaky opacity at the lung bases suggest atelectasis. The patient's known right apical lesion is similar to prior. Left-sided Port-A-Cath tip overlies the proximal SVC level. No acute bony abnormality. Telemetry leads overlie the chest. IMPRESSION: 1. Streaky bibasilar opacities, likely atelectasis. Superimposed basilar pneumonia cannot be excluded, notably on the left. 2. Known right apical lesion is similar to prior. Electronically Signed   By: Donnal Fusi M.D.   On: 03/31/2024 05:22    EKG: Independently reviewed.   Assessment/Plan Principal Problem:   Fever Active Problems:   Cigarette smoker   COPD GOLD 2   Hypertension associated with diabetes (HCC)   Hyperuricemia   Malignant neoplasm of upper lobe of right lung (HCC)   Hypotension   Dehydration   Weakness generalized   Fever Uknown Origin - CT scanning of the chest abdomen and pelvis with no findings of infection which is reassuring. - I offered observation admission to the patient but he and wife declining to stay and would prefer to go home today - Blood cultures pending, no growth to date - Patient received IV cefepime, vancomycin and metronidazole as part of the ED workup and treatment - I explained that this could be a viral illness, also discussed this could be a bacteremia that she has has not presented.  He has a port but does not look infected.  Patient would like to discharge home today.  Says he would return for positive blood cultures or worsening symptoms.  I discussed risks with him and he verbalized understanding.  Patient says he would like to complete his radiation treatment tomorrow on schedule. Will DC home on oral doxycycline 100 mg BID x 5 days starting 04/01/24.  - pt advised to follow up with his oncologist and PCP early this week for recheck and he is agreeable to doing this. Contracted for safety with patient and wife they will return if any new changes, fever, chills,  etc.  Hypotension - Improved after fluid boluses given in ED and patient has been holding his home blood pressure medication which should continue.  Continue to monitor blood pressure at home.  Continue hydration.  Gout - No active symptoms at this time resume home allopurinol  Generalized weakness - Secondary to multifactorial causes - Continue supportive therapies  Tobacco - Patient strongly advised to stop all tobacco use and to use nicotine patches for cravings  Patient strongly advised that if he has recurrence of fever or  chills or worsening weakness or new changes to return to ED for further workup and admission.  He verbalized understanding.  Wife agreeable.  DC home in stable condition today.   Level of care: Med-Surg Faustino Hook MD [facility] How to contact the [facility] Attending or Consulting provider 7A - 7P or covering provider during after hours 7P -7A, for this patient?  Check the care team in [facility] and look for a) attending/consulting [facility] provider listed and b) the [facility] team listed Log into [email or web address] and use [place]'s universal password to access. If you do not have the password, please contact the hospital operator. Locate the [facility] provider you are looking for under [facility] and page to a number that you can be directly reached. If you still have difficulty reaching the provider, please page the [facility] (Director on Call) for the Hospitalists listed on amion for assistance.   If 7PM-7AM, please contact night-coverage [email or web address] Password [facility]  03/31/2024, 11:21 AM

## 2024-03-31 NOTE — ED Provider Notes (Signed)
   ED Course / MDM   Clinical Course as of 03/31/24 1017  Sun Mar 31, 2024  0704 Received sign out from Dr. Kermit Ped, presenting with fevers and chills, on chemotherapy. Pending CT chest and CT abdomen  [WS]  1017 Imaging without source of underlying infection.  Patient remains borderline hypotensive, mildly tachycardic.  Given his active chemotherapy, concern for underlying intervention still reasonably high.  Discussed with the hospitalist who will admit patient. [WS]    Clinical Course User Index [WS] Mordecai Applebaum, MD   Medical Decision Making Amount and/or Complexity of Data Reviewed Labs: ordered. Radiology: ordered.  Risk Prescription drug management. Decision regarding hospitalization.      Mordecai Applebaum, MD 03/31/24 1017

## 2024-04-02 ENCOUNTER — Telehealth: Payer: Self-pay

## 2024-04-02 DIAGNOSIS — C3411 Malignant neoplasm of upper lobe, right bronchus or lung: Secondary | ICD-10-CM | POA: Diagnosis not present

## 2024-04-02 DIAGNOSIS — C3491 Malignant neoplasm of unspecified part of right bronchus or lung: Secondary | ICD-10-CM | POA: Diagnosis not present

## 2024-04-02 NOTE — Telephone Encounter (Signed)
 Received call from nurse navigator requesting soon appointment. Patient is new patient with office and currently scheduled for 4/29. Offered 4/28, but declined. If anything sooner would like to be placed on call back list for contact. Keep in mind patient lives is [place] so any same day cancellation may not be feasible. Routing to front desk to add patient to call back list. Hendricks Locker, LPN

## 2024-04-04 ENCOUNTER — Encounter: Payer: Self-pay | Admitting: Infectious Diseases

## 2024-04-04 ENCOUNTER — Other Ambulatory Visit: Payer: Self-pay

## 2024-04-04 ENCOUNTER — Ambulatory Visit: Admitting: Infectious Diseases

## 2024-04-04 VITALS — BP 118/76 | HR 94 | Temp 98.0°F | Wt 264.0 lb

## 2024-04-04 DIAGNOSIS — B449 Aspergillosis, unspecified: Secondary | ICD-10-CM

## 2024-04-04 DIAGNOSIS — R509 Fever, unspecified: Secondary | ICD-10-CM

## 2024-04-04 DIAGNOSIS — R7401 Elevation of levels of liver transaminase levels: Secondary | ICD-10-CM

## 2024-04-04 DIAGNOSIS — F17210 Nicotine dependence, cigarettes, uncomplicated: Secondary | ICD-10-CM | POA: Diagnosis not present

## 2024-04-04 NOTE — Progress Notes (Unsigned)
 Patient Active Problem List   Diagnosis Date Noted  . Fever 03/31/2024  . Hypotension 03/31/2024  . Dehydration 03/31/2024  . Weakness generalized 03/31/2024  . Generalized weakness 03/31/2024  . Malignant neoplasm of upper lobe of right lung (HCC) 12/01/2023  . Positive colorectal cancer screening using Cologuard test 10/01/2021  . Nonspecific abnormal electrocardiogram (ECG) (EKG) 06/23/2020  . Hypertension associated with diabetes (HCC) 05/05/2020  . Hyperuricemia 05/05/2020  . Morbid obesity due to excess calories (HCC) 09/23/2019  . Gout, chronic 07/01/2016  . Incomplete rotator cuff tear 09/07/2015  . Essential hypertension, benign 09/07/2015  . COPD GOLD 2 03/29/2015  . Cigarette smoker 03/24/2015    Patient's Medications  New Prescriptions   No medications on file  Previous Medications   ACETAMINOPHEN  (TYLENOL ) 500 MG TABLET    Take 1,000 mg by mouth 2 (two) times daily as needed for moderate pain (pain score 4-6), fever or headache.   ALBUTEROL  (VENTOLIN  HFA) 108 (90 BASE) MCG/ACT INHALER    INHALE 2 PUFFS INTO THE LUNGS TWICE A DAY   ALLOPURINOL  (ZYLOPRIM ) 100 MG TABLET    TAKE 1 TABLET (100 MG TOTAL) BY MOUTH DAILY. TO PREVENT GOUT   ASPIRIN EC 81 MG TABLET    Take 81 mg by mouth daily.   BUDESONIDE -FORMOTEROL  (SYMBICORT ) 160-4.5 MCG/ACT INHALER    2 puffs first thing in am and then another 2 puffs about 12 hours later.   COLCHICINE  0.6 MG TABLET    Take 1 tablet (0.6 mg total) by mouth See admin instructions. Take 1 tablet (0.6mg ) by mouth every 2 hours for up to 6 doses as needed for acute gout attack   CONTINUOUS BLOOD GLUC SENSOR (FREESTYLE LIBRE 2 SENSOR) MISC    USE TO TEST BLOOD SUGAR CONTINUOUSLY. Dx. E11.65 SEND TO [facility] FOR MEDICARE COVERAGE, DO NOT SEND TO LOCAL PHARMACY   DOXYCYCLINE  (VIBRAMYCIN ) 100 MG CAPSULE    Take 1 capsule (100 mg total) by mouth 2 (two) times daily for 5 days.   IPRATROPIUM-ALBUTEROL  (DUONEB) 0.5-2.5 (3) MG/3ML  SOLN    Take 3 mLs by nebulization every 4 (four) hours as needed (SOB).   LIDOCAINE -PRILOCAINE (EMLA) CREAM    Apply 1 Application topically every Tuesday.   MELOXICAM  (MOBIC ) 15 MG TABLET    TAKE 1 TABLET (15 MG TOTAL) BY MOUTH DAILY.   METFORMIN  (GLUCOPHAGE -XR) 750 MG 24 HR TABLET    Take 1 tablet (750 mg total) by mouth daily with breakfast.   OMEPRAZOLE (PRILOSEC) 20 MG CAPSULE    Take 20 mg by mouth 2 (two) times daily before a meal.   ONDANSETRON  (ZOFRAN ) 8 MG TABLET    Take 8 mg by mouth every 8 (eight) hours as needed for nausea or vomiting.   POTASSIUM CHLORIDE  SA (KLOR-CON  M20) 20 MEQ TABLET    TAKE 1 TABLET (20 MEQ TOTAL) DAILY BY MOUTH.   TAMSULOSIN  (FLOMAX ) 0.4 MG CAPS CAPSULE    TAKE 2 CAPSULES BY MOUTH AT BEDTIME FOR URINE FLOW AND PROSTATE  Modified Medications   No medications on file  Discontinued Medications   No medications on file    Subjective: Discussed the use of AI scribe software for clinical note transcription with the patient, who gave verbal consent to proceed.   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   He reports waking up one morning feeling extremely cold and having a fever of 102 degrees Fahrenheit. He was taken to the ED 4/13 where work up was largely unremarkable and received intravenous antibiotics and was discharged with a course of PO doxycycline  after he refused to be admitted. He denies any other symptoms such as chest pain, shortness of breath, nausea, vomiting, diarrhea, abdominal pain, headache, blurry vision, genito-urinary symptoms, rashes, or joint pain. He reports that the fever resolved after receiving antibiotics in the hospital. 3 more days of doxycycline  left with minimal cough and occasional phlegm.  He  reports currently undergoing daily radiation therapy and recently had chemotherapy, yesterday He also reports a significant weight loss of about 20 pounds since starting cancer treatments.  Reports  history of gallbladder removal in 2007.  He reports occasional smoking and drinking beer, but not recently as it does not taste good due to the cancer treatments. He lives with his spouse and does not have any pets. He has not traveled recently.   Seen by Rad Onc 4/14, continued on weekly carboplatin/paclitaxel weekly starting 03/05/24. Referred to ID due to recent ED visit for fevers and prior aspergillus noted in lung biopsy.   Review of Systems: all systems reviewed with pertinent positives and negatives as listed above  Past Medical History:  Diagnosis Date  . Arthritis   . BPH associated with nocturia   . Cancer (HCC)    stage III non-small cell lung cancer.  . Chronic gout   . COPD (chronic obstructive pulmonary disease) (HCC)    not on home o2  . History of colon polyps   . History of COVID-19    04/ 2019 hospital admission in epic, severe sepsis due to covid w/ acute respiratory failure;   and 05/ 2021 mild to moderate symptoms, recovered at home  . Hyperlipidemia, mixed   . Hypertension   . Phimosis   . Type 2 diabetes mellitus (HCC)    followed by pcp   (05-17-2022  pt stated checks sugar multiple times daily w/ Libre,  fasting average --- 100-110)   Past Surgical History:  Procedure Laterality Date  . BRONCHIAL BIOPSY  01/22/2024   Procedure: BRONCHIAL BIOPSIES;  Surgeon: Denson Flake, MD;  Location: [facility] ENDOSCOPY;  Service: Pulmonary;;  . BRONCHIAL BRUSHINGS  01/22/2024   Procedure: BRONCHIAL BRUSHINGS;  Surgeon: Denson Flake, MD;  Location[facility] ENDOSCOPY;  Service: Pulmonary;;  . BRONCHIAL NEEDLE ASPIRATION BIOPSY  01/22/2024   Procedure: BRONCHIAL NEEDLE ASPIRATION BIOPSIES;  Surgeon: Denson Flake, MD;  Location: [facility] ENDOSCOPY;  Service: Pulmonary;;  . CIRCUMCISION N/A  05/19/2022   Procedure: CIRCUMCISION ADULT;  Surgeon: Trent Frizzle, MD;  Location: [facility];  Service: Urology;  Laterality: N/A;  45 MINUTES  . COLONOSCOPY  12/01/2021   by dr Dominic Friendly  . ENDOBRONCHIAL ULTRASOUND  01/22/2024   Procedure: ENDOBRONCHIAL ULTRASOUND;  Surgeon: Denson Flake, MD;  Location: [facility] ENDOSCOPY;  Service: Pulmonary;;  . FINE NEEDLE ASPIRATION  01/22/2024   Procedure: FINE NEEDLE ASPIRATION (FNA) LINEAR;  Surgeon: Denson Flake, MD;  Location: [facility] ENDOSCOPY;  Service: Pulmonary;;  . LAPAROSCOPIC CHOLECYSTECTOMY  2008   in [place]  . TONSILLECTOMY     child  . VIDEO BRONCHOSCOPY WITH RADIAL ENDOBRONCHIAL ULTRASOUND  01/22/2024   Procedure: VIDEO BRONCHOSCOPY WITH RADIAL ENDOBRONCHIAL ULTRASOUND;  Surgeon: Denson Flake, MD;  Location: [facility] ENDOSCOPY;  Service: Pulmonary;;     Social History   Tobacco Use  . Smoking status: Every Day    Current packs/day: 0.00    Average packs/day: 0.3 packs/day for 40.0 years (10.0 ttl pk-yrs)    Types: Cigarettes    Start date: 01/15/1977    Last attempt to quit: 01/15/2017    Years since quitting: 7.2  . Smokeless tobacco: Current    Types: Chew  . Tobacco comments:    Smokes half to one pack per day for about 45 years - now also smokes e-cigarette sometimes and chews tobacco.  Updated 12/01/2023.  Vaping Use  . Vaping status: Former  . Substances: Nicotine   . Devices: unsure  Substance Use Topics  . Alcohol use: Yes    Alcohol/week: 24.0 standard drinks of alcohol    Types: 24 Cans of beer per week  . Drug use: Never    Family History  Problem Relation Age of Onset  . Asthma Mother   . Diabetes Father   . Heart attack Father 54  . Leukemia Brother   . Esophageal cancer Paternal Uncle   . Lung disease Other        pat cousin  . Heart attack Other        pat cousin  . Colon cancer Neg Hx   . Stomach cancer Neg Hx     No Known Allergies  Health Maintenance  Topic Date Due  . OPHTHALMOLOGY  EXAM  03/05/2024  . Zoster Vaccines- Shingrix (1 of 2) 04/10/2024 (Originally 07/20/1970)  . COVID-19 Vaccine (4 - 2024-25 season) 04/13/2024 (Originally 08/20/2023)  . Diabetic kidney evaluation - Urine ACR  06/06/2024  . FOOT EXAM  06/06/2024  . HEMOGLOBIN A1C  07/10/2024  . INFLUENZA VACCINE  07/19/2024  . Medicare Annual Wellness (AWV)  08/21/2024  . Colonoscopy  12/01/2024  . Diabetic kidney evaluation - eGFR measurement  03/31/2025  . DTaP/Tdap/Td (2 - Td or Tdap) 02/08/2034  . Pneumonia Vaccine 28+ Years old  Completed  . Hepatitis C Screening  Completed  . HPV VACCINES  Aged Out  . Meningococcal B Vaccine  Aged Out  . Lung Cancer Screening  Discontinued  . Fecal DNA (Cologuard)  Discontinued    Objective: BP 118/76   Pulse 94   Temp 98 F (36.7 C) (Oral)   Wt 264 lb (119.7 kg)   SpO2 94%   BMI 37.88 kg/m    Physical Exam Constitutional:      Appearance: Normal appearance.  HENT:     Head: Normocephalic and atraumatic.      Mouth: Mucous membranes are moist.  Eyes:    Conjunctiva/sclera: Conjunctivae normal.     Pupils: Pupils are equal, round, and b/l symmetrical  Cardiovascular:     Rate and Rhythm: Normal rate and regular rhythm.     Heart sounds: S1S2  Pulmonary:     Effort: Pulmonary effort is normal.     Breath sounds: Normal breath sounds.   Abdominal:     General: Non distended     Palpations: soft.   Musculoskeletal:        General: Normal range of motion.   Skin:    General: Skin is warm and dry.     Comments:  Neurological:     General: grossly non focal     Mental Status: awake, alert and oriented to person, place, and time.   Psychiatric:        Mood and Affect: Mood  normal.   Lab Results Lab Results  Component Value Date   WBC 7.6 03/31/2024   HGB 11.0 (L) 03/31/2024   HCT 32.8 (L) 03/31/2024   MCV 100.0 03/31/2024   PLT 185 03/31/2024    Lab Results  Component Value Date   CREATININE 1.17 03/31/2024   BUN 28 (H)  03/31/2024   NA 135 03/31/2024   K 3.9 03/31/2024   CL 99 03/31/2024   CO2 27 03/31/2024    Lab Results  Component Value Date   ALT 129 (H) 03/31/2024   AST 80 (H) 03/31/2024   ALKPHOS 138 (H) 03/31/2024   BILITOT 1.6 (H) 03/31/2024    Lab Results  Component Value Date   CHOL 167 01/11/2024   HDL 49 01/11/2024   LDLCALC 99 01/11/2024   TRIG 103 01/11/2024   CHOLHDL 3.4 01/11/2024   Lab Results  Component Value Date   LABRPR Non Reactive 03/30/2018   No results found for: "HIV1RNAQUANT", "HIV1RNAVL", "CD4TABS"  Assessment/Plan # Fever of unclear etiology- resolved   # Prior RUL FNA with + aspergillus  - BAL cx 2/3 negative for fungus, AFB as well as negative aerobic and anaerobic cx growth    I have personally spent 75 minutes involved in face-to-face and non-face-to-face activities for this patient on the day of the visit. Professional time spent includes the following activities: Preparing to see the patient (review of tests), Obtaining and/or reviewing separately obtained history (admission/discharge record), Performing a medically appropriate examination and/or evaluation , Ordering medications/tests/procedures, referring and communicating with other health care professionals, Documenting clinical information in the EMR, Independently interpreting results (not separately reported), Communicating results to the patient/family/caregiver, Counseling and educating the patient/family/caregiver and Care coordination (not separately reported).   Of note, portions of this note may have been created with voice recognition software. While this note has been edited for accuracy, occasional wrong-word or 'sound-a-like' substitutions may have occurred due to the inherent limitations of voice recognition software.   Melvina Stage, MD [facility] 04/04/2024, 8:20 AM

## 2024-04-05 LAB — CBC
HCT: 32 % — ABNORMAL LOW (ref 38.5–50.0)
Hemoglobin: 11.1 g/dL — ABNORMAL LOW (ref 13.2–17.1)
MCH: 33.6 pg — ABNORMAL HIGH (ref 27.0–33.0)
MCHC: 34.7 g/dL (ref 32.0–36.0)
MCV: 97 fL (ref 80.0–100.0)
MPV: 10.9 fL (ref 7.5–12.5)
Platelets: 170 10*3/uL (ref 140–400)
RBC: 3.3 10*6/uL — ABNORMAL LOW (ref 4.20–5.80)
RDW: 14.7 % (ref 11.0–15.0)
WBC: 5.3 10*3/uL (ref 3.8–10.8)

## 2024-04-05 LAB — COMPREHENSIVE METABOLIC PANEL WITH GFR
AG Ratio: 1.1 (calc) (ref 1.0–2.5)
ALT: 100 U/L — ABNORMAL HIGH (ref 9–46)
AST: 55 U/L — ABNORMAL HIGH (ref 10–35)
Albumin: 3.7 g/dL (ref 3.6–5.1)
Alkaline phosphatase (APISO): 117 U/L (ref 35–144)
BUN/Creatinine Ratio: 24 (calc) — ABNORMAL HIGH (ref 6–22)
BUN: 29 mg/dL — ABNORMAL HIGH (ref 7–25)
CO2: 25 mmol/L (ref 20–32)
Calcium: 8.8 mg/dL (ref 8.6–10.3)
Chloride: 102 mmol/L (ref 98–110)
Creat: 1.19 mg/dL (ref 0.70–1.28)
Globulin: 3.4 g/dL (ref 1.9–3.7)
Glucose, Bld: 97 mg/dL (ref 65–99)
Potassium: 3.7 mmol/L (ref 3.5–5.3)
Sodium: 143 mmol/L (ref 135–146)
Total Bilirubin: 0.9 mg/dL (ref 0.2–1.2)
Total Protein: 7.1 g/dL (ref 6.1–8.1)
eGFR: 66 mL/min/{1.73_m2} (ref >=60)

## 2024-04-05 LAB — CULTURE, BLOOD (ROUTINE X 2)
Culture: NO GROWTH
Culture: NO GROWTH
Special Requests: ADEQUATE

## 2024-04-05 LAB — C-REACTIVE PROTEIN: CRP: 24.8 mg/L — ABNORMAL HIGH (ref <8.0)

## 2024-04-05 LAB — SEDIMENTATION RATE: Sed Rate: 17 mm/h (ref 0–20)

## 2024-04-08 ENCOUNTER — Telehealth: Payer: Self-pay

## 2024-04-08 DIAGNOSIS — C3491 Malignant neoplasm of unspecified part of right bronchus or lung: Secondary | ICD-10-CM | POA: Diagnosis not present

## 2024-04-08 DIAGNOSIS — B449 Aspergillosis, unspecified: Secondary | ICD-10-CM | POA: Insufficient documentation

## 2024-04-08 NOTE — Telephone Encounter (Signed)
 Per Dr. 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 Spoke with Lutricia Salts who states biopsy was done at [facility] on 01/22/24 and noted on 01/30/24 at appointment. Will results to office.  If provider needs to discuss further with referring office can reach Dr. Cipriano Creeks.   Julien Odor, RMA

## 2024-04-08 NOTE — Telephone Encounter (Signed)
-----   Message from Melvina Stage sent at 04/08/2024  2:19 PM EDT ----- Regarding: Fu on biopsy results Cece,  This patient I saw last Thursday with you, I had requested for biopsy results from the referring provider regarding official results of biopsy with + Aspergillus. It was not available in the referral notes. Please let me know if you are able to get them and notify me.

## 2024-04-09 DIAGNOSIS — C3411 Malignant neoplasm of upper lobe, right bronchus or lung: Secondary | ICD-10-CM | POA: Diagnosis not present

## 2024-04-09 DIAGNOSIS — C799 Secondary malignant neoplasm of unspecified site: Secondary | ICD-10-CM | POA: Diagnosis not present

## 2024-04-09 DIAGNOSIS — R7401 Elevation of levels of liver transaminase levels: Secondary | ICD-10-CM | POA: Diagnosis not present

## 2024-04-09 DIAGNOSIS — R7989 Other specified abnormal findings of blood chemistry: Secondary | ICD-10-CM | POA: Diagnosis not present

## 2024-04-09 DIAGNOSIS — C3491 Malignant neoplasm of unspecified part of right bronchus or lung: Secondary | ICD-10-CM | POA: Diagnosis not present

## 2024-04-09 DIAGNOSIS — K769 Liver disease, unspecified: Secondary | ICD-10-CM | POA: Diagnosis not present

## 2024-04-09 DIAGNOSIS — B449 Aspergillosis, unspecified: Secondary | ICD-10-CM | POA: Diagnosis not present

## 2024-04-09 DIAGNOSIS — C771 Secondary and unspecified malignant neoplasm of intrathoracic lymph nodes: Secondary | ICD-10-CM | POA: Diagnosis not present

## 2024-04-09 DIAGNOSIS — Z08 Encounter for follow-up examination after completed treatment for malignant neoplasm: Secondary | ICD-10-CM | POA: Diagnosis not present

## 2024-04-11 ENCOUNTER — Ambulatory Visit: Payer: Medicare HMO | Admitting: Family Medicine

## 2024-04-11 ENCOUNTER — Telehealth: Payer: Self-pay

## 2024-04-11 DIAGNOSIS — R7989 Other specified abnormal findings of blood chemistry: Secondary | ICD-10-CM | POA: Diagnosis not present

## 2024-04-11 DIAGNOSIS — C3491 Malignant neoplasm of unspecified part of right bronchus or lung: Secondary | ICD-10-CM | POA: Diagnosis not present

## 2024-04-11 DIAGNOSIS — K769 Liver disease, unspecified: Secondary | ICD-10-CM | POA: Diagnosis not present

## 2024-04-11 DIAGNOSIS — R7401 Elevation of levels of liver transaminase levels: Secondary | ICD-10-CM | POA: Diagnosis not present

## 2024-04-11 DIAGNOSIS — B449 Aspergillosis, unspecified: Secondary | ICD-10-CM | POA: Diagnosis not present

## 2024-04-11 NOTE — Telephone Encounter (Signed)
-----   Message from Melvina Stage sent at 04/11/2024  7:19 AM EDT ----- Regarding: Fu on results Please let patient know I reviewed results of prior aspergillus from bronchoscopy in Feb 2025, however no other supportive findings to suggest invasive aspergillosis, like in his CT chest or symptomatically.   Hence, would hold off on anti-fungal treatment for now but would like to fu in 3-4 weeks to see how he is doing.

## 2024-04-11 NOTE — Telephone Encounter (Signed)
 Attempted to call patient to relay provider message. Will send mychart message. Julien Odor, RMA

## 2024-04-16 ENCOUNTER — Ambulatory Visit: Admitting: Internal Medicine

## 2024-04-18 DIAGNOSIS — E876 Hypokalemia: Secondary | ICD-10-CM | POA: Diagnosis not present

## 2024-04-18 DIAGNOSIS — K209 Esophagitis, unspecified without bleeding: Secondary | ICD-10-CM | POA: Diagnosis not present

## 2024-04-23 DIAGNOSIS — K209 Esophagitis, unspecified without bleeding: Secondary | ICD-10-CM | POA: Diagnosis not present

## 2024-04-23 DIAGNOSIS — C3491 Malignant neoplasm of unspecified part of right bronchus or lung: Secondary | ICD-10-CM | POA: Diagnosis not present

## 2024-04-23 DIAGNOSIS — Z5111 Encounter for antineoplastic chemotherapy: Secondary | ICD-10-CM | POA: Diagnosis not present

## 2024-04-27 ENCOUNTER — Other Ambulatory Visit: Payer: Self-pay | Admitting: Family Medicine

## 2024-04-30 DIAGNOSIS — Z08 Encounter for follow-up examination after completed treatment for malignant neoplasm: Secondary | ICD-10-CM | POA: Diagnosis not present

## 2024-04-30 DIAGNOSIS — R7989 Other specified abnormal findings of blood chemistry: Secondary | ICD-10-CM | POA: Diagnosis not present

## 2024-04-30 DIAGNOSIS — C3411 Malignant neoplasm of upper lobe, right bronchus or lung: Secondary | ICD-10-CM | POA: Diagnosis not present

## 2024-04-30 DIAGNOSIS — C771 Secondary and unspecified malignant neoplasm of intrathoracic lymph nodes: Secondary | ICD-10-CM | POA: Diagnosis not present

## 2024-04-30 DIAGNOSIS — R7401 Elevation of levels of liver transaminase levels: Secondary | ICD-10-CM | POA: Diagnosis not present

## 2024-04-30 DIAGNOSIS — R599 Enlarged lymph nodes, unspecified: Secondary | ICD-10-CM | POA: Diagnosis not present

## 2024-04-30 DIAGNOSIS — C3490 Malignant neoplasm of unspecified part of unspecified bronchus or lung: Secondary | ICD-10-CM | POA: Diagnosis not present

## 2024-04-30 DIAGNOSIS — K769 Liver disease, unspecified: Secondary | ICD-10-CM | POA: Diagnosis not present

## 2024-04-30 DIAGNOSIS — B449 Aspergillosis, unspecified: Secondary | ICD-10-CM | POA: Diagnosis not present

## 2024-04-30 DIAGNOSIS — C3491 Malignant neoplasm of unspecified part of right bronchus or lung: Secondary | ICD-10-CM | POA: Diagnosis not present

## 2024-05-07 ENCOUNTER — Encounter: Payer: Self-pay | Admitting: Family Medicine

## 2024-05-07 ENCOUNTER — Ambulatory Visit (INDEPENDENT_AMBULATORY_CARE_PROVIDER_SITE_OTHER): Admitting: Family Medicine

## 2024-05-07 VITALS — BP 113/67 | HR 87 | Temp 97.3°F | Ht 70.0 in | Wt 253.0 lb

## 2024-05-07 DIAGNOSIS — C3491 Malignant neoplasm of unspecified part of right bronchus or lung: Secondary | ICD-10-CM | POA: Diagnosis not present

## 2024-05-07 DIAGNOSIS — E790 Hyperuricemia without signs of inflammatory arthritis and tophaceous disease: Secondary | ICD-10-CM | POA: Diagnosis not present

## 2024-05-07 DIAGNOSIS — E861 Hypovolemia: Secondary | ICD-10-CM

## 2024-05-07 DIAGNOSIS — K769 Liver disease, unspecified: Secondary | ICD-10-CM | POA: Diagnosis not present

## 2024-05-07 DIAGNOSIS — B449 Aspergillosis, unspecified: Secondary | ICD-10-CM | POA: Diagnosis not present

## 2024-05-07 DIAGNOSIS — R531 Weakness: Secondary | ICD-10-CM

## 2024-05-07 DIAGNOSIS — C3411 Malignant neoplasm of upper lobe, right bronchus or lung: Secondary | ICD-10-CM

## 2024-05-07 DIAGNOSIS — R7989 Other specified abnormal findings of blood chemistry: Secondary | ICD-10-CM | POA: Diagnosis not present

## 2024-05-07 DIAGNOSIS — E782 Mixed hyperlipidemia: Secondary | ICD-10-CM | POA: Diagnosis not present

## 2024-05-07 DIAGNOSIS — I10 Essential (primary) hypertension: Secondary | ICD-10-CM | POA: Diagnosis not present

## 2024-05-07 DIAGNOSIS — R7401 Elevation of levels of liver transaminase levels: Secondary | ICD-10-CM | POA: Diagnosis not present

## 2024-05-07 MED ORDER — ALLOPURINOL 100 MG PO TABS: ORAL_TABLET | ORAL | 0 refills | Status: DC

## 2024-05-07 MED ORDER — NEXLETOL 180 MG PO TABS: 180.0000 mg | ORAL_TABLET | Freq: Every day | ORAL | 2 refills | Status: DC

## 2024-05-07 NOTE — Progress Notes (Signed)
 Subjective:  Patient ID: Juan Merritt, male    DOB: 08-Oct-1951  Age: 73 y.o. MRN: [id number]  CC: Hypotension (Pt brought readings with him. Doing better.), not eatng (Pt did not have appetite and was unable to eat due to chemo. Had to get fluids. Feeling better. ), and sore (Bottom of left foot. There was  blister there but that is healed.  Tender when on feet. 6 weeks. )   HPI Juan Merritt off BP meds since pressure went low. Missed a doe of chemo. Before that getting treatment for her lung cancer.  Says the radiation b burned him up.  He is getting a CT possibly a PET scan later today.  Starting immunotherapy next.  Concerned about blood pressure running low so we stopped his medicine last visit.  He brings in multiple readings since that time showing the systolic to be primarily in the low 110-130 range and the diastolic in the 70-80 range.  He is now starting to eat a little better.  He wonders if he will need to go back on his medication.  He also needs coverage for his cholesterol.  He is due for refill of his allopurinol .  Both of these will be performed today     History Juan Merritt has a past medical history of Arthritis, BPH associated with nocturia, Cancer (HCC), Chronic gout, COPD (chronic obstructive pulmonary disease) (HCC), History of colon polyps, History of COVID-19, Hyperlipidemia, mixed, Hypertension, Phimosis, and Type 2 diabetes mellitus (HCC).   He has a past surgical history that includes Laparoscopic cholecystectomy (2008); Tonsillectomy; Colonoscopy (12/01/2021); Circumcision (N/A, 05/19/2022); Endobronchial ultrasound (01/22/2024); Bronchial needle aspiration biopsy (01/22/2024); Bronchial biopsy (01/22/2024); Bronchial brushings (01/22/2024); Video bronchoscopy with radial endobronchial ultrasound (01/22/2024); and Fine needle aspiration (01/22/2024).   His family history includes Asthma in his mother; Diabetes in his father; Esophageal cancer in his paternal uncle; Heart attack  in an other family member; Heart attack (age of onset: 34) in his father; Leukemia in his brother; Lung disease in an other family member.He reports that he has been smoking cigarettes. He started smoking about 47 years ago. He has a 10 pack-year smoking history. His smokeless tobacco use includes chew. He reports current alcohol use of about 24.0 standard drinks of alcohol per week. He reports that he does not use drugs.  Current Outpatient Medications on File Prior to Visit  Medication Sig Dispense Refill   acetaminophen  (TYLENOL ) 500 MG tablet Take 1,000 mg by mouth 2 (two) times daily as needed for moderate pain (pain score 4-6), fever or headache.     albuterol  (VENTOLIN  HFA) 108 (90 Base) MCG/ACT inhaler INHALE 2 PUFFS INTO THE LUNGS TWICE A DAY 18 each 5   aspirin EC 81 MG tablet Take 81 mg by mouth daily.     budesonide -formoterol  (SYMBICORT ) 160-4.5 MCG/ACT inhaler 2 puffs first thing in am and then another 2 puffs about 12 hours later. 1 each 6   colchicine  0.6 MG tablet Take 1 tablet (0.6 mg total) by mouth See admin instructions. Take 1 tablet (0.6mg ) by mouth every 2 hours for up to 6 doses as needed for acute gout attack     Continuous Blood Gluc Sensor (FREESTYLE LIBRE 2 SENSOR) MISC USE TO TEST BLOOD SUGAR CONTINUOUSLY. Dx. E11.65 SEND TO [facility] FOR MEDICARE COVERAGE, DO NOT SEND TO LOCAL PHARMACY 2 each 0   ipratropium-albuterol  (DUONEB) 0.5-2.5 (3) MG/3ML SOLN Take 3 mLs by nebulization every 4 (four) hours as needed (SOB).  lidocaine -prilocaine (EMLA) cream Apply 1 Application topically every Tuesday.     meloxicam  (MOBIC ) 15 MG tablet TAKE 1 TABLET (15 MG TOTAL) BY MOUTH DAILY. 90 tablet 0   metFORMIN  (GLUCOPHAGE -XR) 750 MG 24 hr tablet TAKE 1 TABLET BY MOUTH EVERY DAY WITH BREAKFAST 90 tablet 0   omeprazole (PRILOSEC) 20 MG capsule Take 20 mg by mouth 2 (two) times daily before a meal.     ondansetron  (ZOFRAN ) 8 MG tablet Take 8 mg by mouth every 8 (eight) hours as  needed for nausea or vomiting.     potassium chloride  SA (KLOR-CON  M20) 20 MEQ tablet TAKE 1 TABLET (20 MEQ TOTAL) DAILY BY MOUTH. 90 tablet 3   tamsulosin  (FLOMAX ) 0.4 MG CAPS capsule TAKE 2 CAPSULES BY MOUTH AT BEDTIME FOR URINE FLOW AND PROSTATE 180 capsule 2   No current facility-administered medications on file prior to visit.    ROS Review of Systems  Objective:  BP 113/67   Pulse 87   Temp (!) 97.3 F (36.3 C)   Ht 5\' 10"  (1.778 m)   Wt 253 lb (114.8 kg)   SpO2 98%   BMI 36.30 kg/m   BP Readings from Last 3 Encounters:  05/07/24 113/67  04/04/24 118/76  03/31/24 (!) 126/92    Wt Readings from Last 3 Encounters:  05/07/24 253 lb (114.8 kg)  04/04/24 264 lb (119.7 kg)  03/31/24 264 lb (119.7 kg)    Lab Results  Component Value Date   HGBA1C 5.8 (H) 01/11/2024   HGBA1C 5.8 (H) 09/20/2023   HGBA1C 6.0 (H) 06/07/2023    Physical Exam     Assessment & Plan:  Malignant neoplasm of upper lobe of right lung (HCC)  Hyperuricemia -     Allopurinol ; TAKE 1 TABLET (100 MG TOTAL) BY MOUTH DAILY. TO PREVENT GOUT  Dispense: 90 tablet; Refill: 0  Essential hypertension, benign -     CMP14+EGFR -     CBC with Differential/Platelet -     Lipid panel  Mixed hyperlipidemia -     Nexletol; Take 1 tablet (180 mg total) by mouth daily.  Dispense: 30 tablet; Refill: 2 -     CMP14+EGFR -     CBC with Differential/Platelet -     Lipid panel  Weakness generalized  Hypotension due to hypovolemia   Is possible that once he gets back on a regular diet that his blood pressure will tend to go back up.  As a result I asked him to stay off the medication for now but to monitor the blood pressure at home and notify me if it should start going up above 130/80 on a regular basis.   Follow-up: Return in about 3 months (around 08/07/2024).  Roise Cleaver, M.D.

## 2024-05-08 DIAGNOSIS — I82890 Acute embolism and thrombosis of other specified veins: Secondary | ICD-10-CM | POA: Diagnosis not present

## 2024-05-08 DIAGNOSIS — I82C12 Acute embolism and thrombosis of left internal jugular vein: Secondary | ICD-10-CM | POA: Diagnosis not present

## 2024-05-08 DIAGNOSIS — C3491 Malignant neoplasm of unspecified part of right bronchus or lung: Secondary | ICD-10-CM | POA: Diagnosis not present

## 2024-05-09 ENCOUNTER — Other Ambulatory Visit: Payer: Self-pay

## 2024-05-09 ENCOUNTER — Telehealth: Payer: Self-pay

## 2024-05-09 ENCOUNTER — Ambulatory Visit: Admitting: Infectious Diseases

## 2024-05-09 ENCOUNTER — Other Ambulatory Visit

## 2024-05-09 DIAGNOSIS — E782 Mixed hyperlipidemia: Secondary | ICD-10-CM | POA: Diagnosis not present

## 2024-05-09 DIAGNOSIS — I10 Essential (primary) hypertension: Secondary | ICD-10-CM | POA: Diagnosis not present

## 2024-05-09 LAB — CMP14+EGFR
ALT: 30 IU/L (ref 0–44)
AST: 29 IU/L (ref 0–40)
Albumin: 3.7 g/dL — ABNORMAL LOW (ref 3.9–4.9)
Alkaline Phosphatase: 67 IU/L (ref 44–121)
BUN/Creatinine Ratio: 9 — ABNORMAL LOW (ref 10–24)
BUN: 9 mg/dL (ref 8–27)
Bilirubin Total: 0.4 mg/dL (ref 0.0–1.2)
CO2: 23 mmol/L (ref 20–29)
Calcium: 8.8 mg/dL (ref 8.6–10.2)
Chloride: 99 mmol/L (ref 96–106)
Creatinine, Ser: 1.05 mg/dL (ref 0.76–1.27)
Globulin, Total: 2.9 g/dL (ref 1.5–4.5)
Glucose: 97 mg/dL (ref 70–99)
Potassium: 3.3 mmol/L — ABNORMAL LOW (ref 3.5–5.2)
Sodium: 139 mmol/L (ref 134–144)
Total Protein: 6.6 g/dL (ref 6.0–8.5)
eGFR: 76 mL/min/{1.73_m2} (ref >59)

## 2024-05-09 LAB — LIPID PANEL
Chol/HDL Ratio: 3.8 ratio (ref 0.0–5.0)
Cholesterol, Total: 142 mg/dL (ref 100–199)
HDL: 37 mg/dL — ABNORMAL LOW (ref >39)
LDL Chol Calc (NIH): 79 mg/dL (ref 0–99)
Triglycerides: 146 mg/dL (ref 0–149)
VLDL Cholesterol Cal: 26 mg/dL (ref 5–40)

## 2024-05-09 LAB — CBC WITH DIFFERENTIAL/PLATELET
Basophils Absolute: 0 10*3/uL (ref 0.0–0.2)
Basos: 1 %
EOS (ABSOLUTE): 0.1 10*3/uL (ref 0.0–0.4)
Eos: 2 %
Hematocrit: 29.7 % — ABNORMAL LOW (ref 37.5–51.0)
Hemoglobin: 9.8 g/dL — ABNORMAL LOW (ref 13.0–17.7)
Immature Grans (Abs): 0 10*3/uL (ref 0.0–0.1)
Immature Granulocytes: 0 %
Lymphocytes Absolute: 1 10*3/uL (ref 0.7–3.1)
Lymphs: 30 %
MCH: 34.5 pg — ABNORMAL HIGH (ref 26.6–33.0)
MCHC: 33 g/dL (ref 31.5–35.7)
MCV: 105 fL — ABNORMAL HIGH (ref 79–97)
Monocytes Absolute: 0.6 10*3/uL (ref 0.1–0.9)
Monocytes: 17 %
Neutrophils Absolute: 1.7 10*3/uL (ref 1.4–7.0)
Neutrophils: 50 %
Platelets: 275 10*3/uL (ref 150–450)
RBC: 2.84 x10E6/uL — ABNORMAL LOW (ref 4.14–5.80)
RDW: 18.2 % — ABNORMAL HIGH (ref 11.6–15.4)
WBC: 3.3 10*3/uL — ABNORMAL LOW (ref 3.4–10.8)

## 2024-05-09 NOTE — Telephone Encounter (Signed)
 Copied from CRM [id number]. Topic: Clinical - Medication Question >> May 09, 2024 12:44 PM Juan Merritt wrote: Reason for CRM: Pt wife called to inform Dr Veleta Gerold that Pt forgot to mention he started on Eloquist this week prescribed by the cancer Dr and she will call back with the strength and dosage when she gets home later

## 2024-05-09 NOTE — Telephone Encounter (Signed)
 Added to chart and pt informed we already have records. lS

## 2024-05-14 ENCOUNTER — Ambulatory Visit: Payer: Self-pay | Admitting: Family Medicine

## 2024-05-14 DIAGNOSIS — E1165 Type 2 diabetes mellitus with hyperglycemia: Secondary | ICD-10-CM | POA: Diagnosis not present

## 2024-05-14 NOTE — Progress Notes (Signed)
Hello Juan Merritt,  Your lab result is normal and/or stable.Some minor variations that are not significant are commonly marked abnormal, but do not represent any medical problem for you.  Best regards, Hollye Pritt, M.D.

## 2024-05-15 DIAGNOSIS — R7401 Elevation of levels of liver transaminase levels: Secondary | ICD-10-CM | POA: Diagnosis not present

## 2024-05-15 DIAGNOSIS — K769 Liver disease, unspecified: Secondary | ICD-10-CM | POA: Diagnosis not present

## 2024-05-15 DIAGNOSIS — R7989 Other specified abnormal findings of blood chemistry: Secondary | ICD-10-CM | POA: Diagnosis not present

## 2024-05-15 DIAGNOSIS — C3491 Malignant neoplasm of unspecified part of right bronchus or lung: Secondary | ICD-10-CM | POA: Diagnosis not present

## 2024-05-15 DIAGNOSIS — R599 Enlarged lymph nodes, unspecified: Secondary | ICD-10-CM | POA: Diagnosis not present

## 2024-05-15 DIAGNOSIS — C3411 Malignant neoplasm of upper lobe, right bronchus or lung: Secondary | ICD-10-CM | POA: Diagnosis not present

## 2024-05-15 DIAGNOSIS — C771 Secondary and unspecified malignant neoplasm of intrathoracic lymph nodes: Secondary | ICD-10-CM | POA: Diagnosis not present

## 2024-05-15 DIAGNOSIS — C3490 Malignant neoplasm of unspecified part of unspecified bronchus or lung: Secondary | ICD-10-CM | POA: Diagnosis not present

## 2024-05-15 DIAGNOSIS — B449 Aspergillosis, unspecified: Secondary | ICD-10-CM | POA: Diagnosis not present

## 2024-05-15 DIAGNOSIS — Z08 Encounter for follow-up examination after completed treatment for malignant neoplasm: Secondary | ICD-10-CM | POA: Diagnosis not present

## 2024-05-16 DIAGNOSIS — C3491 Malignant neoplasm of unspecified part of right bronchus or lung: Secondary | ICD-10-CM | POA: Diagnosis not present

## 2024-05-16 DIAGNOSIS — R7401 Elevation of levels of liver transaminase levels: Secondary | ICD-10-CM | POA: Diagnosis not present

## 2024-05-16 DIAGNOSIS — Z5112 Encounter for antineoplastic immunotherapy: Secondary | ICD-10-CM | POA: Diagnosis not present

## 2024-05-16 DIAGNOSIS — B449 Aspergillosis, unspecified: Secondary | ICD-10-CM | POA: Diagnosis not present

## 2024-05-16 DIAGNOSIS — K769 Liver disease, unspecified: Secondary | ICD-10-CM | POA: Diagnosis not present

## 2024-05-16 DIAGNOSIS — R7989 Other specified abnormal findings of blood chemistry: Secondary | ICD-10-CM | POA: Diagnosis not present

## 2024-06-05 DIAGNOSIS — R7989 Other specified abnormal findings of blood chemistry: Secondary | ICD-10-CM | POA: Diagnosis not present

## 2024-06-05 DIAGNOSIS — K769 Liver disease, unspecified: Secondary | ICD-10-CM | POA: Diagnosis not present

## 2024-06-05 DIAGNOSIS — D539 Nutritional anemia, unspecified: Secondary | ICD-10-CM | POA: Diagnosis not present

## 2024-06-05 DIAGNOSIS — R7401 Elevation of levels of liver transaminase levels: Secondary | ICD-10-CM | POA: Diagnosis not present

## 2024-06-05 DIAGNOSIS — I82890 Acute embolism and thrombosis of other specified veins: Secondary | ICD-10-CM | POA: Diagnosis not present

## 2024-06-05 DIAGNOSIS — C3491 Malignant neoplasm of unspecified part of right bronchus or lung: Secondary | ICD-10-CM | POA: Diagnosis not present

## 2024-06-05 DIAGNOSIS — B449 Aspergillosis, unspecified: Secondary | ICD-10-CM | POA: Diagnosis not present

## 2024-06-05 DIAGNOSIS — R5383 Other fatigue: Secondary | ICD-10-CM | POA: Diagnosis not present

## 2024-06-26 DIAGNOSIS — C799 Secondary malignant neoplasm of unspecified site: Secondary | ICD-10-CM | POA: Diagnosis not present

## 2024-06-26 DIAGNOSIS — C3491 Malignant neoplasm of unspecified part of right bronchus or lung: Secondary | ICD-10-CM | POA: Diagnosis not present

## 2024-06-27 DIAGNOSIS — C799 Secondary malignant neoplasm of unspecified site: Secondary | ICD-10-CM | POA: Diagnosis not present

## 2024-06-27 DIAGNOSIS — Z5112 Encounter for antineoplastic immunotherapy: Secondary | ICD-10-CM | POA: Diagnosis not present

## 2024-06-27 DIAGNOSIS — C3411 Malignant neoplasm of upper lobe, right bronchus or lung: Secondary | ICD-10-CM | POA: Diagnosis not present

## 2024-07-05 ENCOUNTER — Other Ambulatory Visit: Payer: Self-pay | Admitting: Family Medicine

## 2024-07-28 NOTE — Progress Notes (Addendum)
 Subjective:    Patient ID: Juan Merritt, male   DOB: 12/11/1951     MRN: [id number]   Brief patient profile:  82  yowm retired IT trainer active smoker some doe since pna 2015 with documented GOLD II copd 04/30/15  much worse x mid March 2015 referred by [name] at [place] but in meantime  > admitted [facility] with aecopd:   Admit date: 03/23/2015 Discharge date: 03/26/2015      Discharge Diagnoses:  Principal Problem:   COPD exacerbation   Acute respiratory failure   Tobacco abuse     Filed Weights     03/23/15 2210  03/25/15 0512  03/26/15 0938   Weight:  113.626 kg (250 lb 8 oz)  114.2 kg (251 lb 12.3 oz)  113.1 kg (249 lb 5.4 oz)     History of present illness:   Patient is a 73 year old male, 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 He was found to have an O2 sat of 87% in the outpatient setting for which he was referred to the emergency department and admitted for COPD exacerbation  Hospital Course:   Chronic obstructive pulmonary disease exacerbation. - Patient was told to have COPD 6 months ago. - Has ongoing 80-pack-year smoking history. Tobacco cessation counseled. - Patient has appointment to see a pulmonologist on 03/27/15- advised to keep that appointment - Treated with oxygen , bronchodilators, IV Solu-Medrol  and levofloxacin . - Clinically improved and desaturation evaluation completed and maintains oxygen  saturation in the low 90s on room air at rest and with activity. - DC home on prednisone  taper and complete 7 days of oral levofloxacin . - Has pulmonology consultation appointment tomorrow at which time may consider starting maintenance medications i.e. Advair/Symbicort .   Acute respiratory failure with hypoxia. - Evidence by an O2 sat  of 87%, having new oxygen  requirement, likely secondary to COPD exacerbation - Continue management as above and titrate down oxygen  to maintain saturation between 89-92 percent. - D dimer: neg    Tobacco abuse. - On nicotine  patch - Cessation counseled extensively and repeatedly.    Essential hypertension  - Controlled.   - Continue amlodipine  and Cozaar .    HLD - As per patient and spouse report. - However not on statins.   History of gout - Continue allopurinol . - No acute flare  Chronic lower extremity edema - Unclear etiology. - Serum albumin normal. - Recommend getting urine microscopy and further workup as outpatient as deemed necessary. - 2-D echo results as below showing normal LVEF and grade 1 diastolic dysfunction. Clinically does not appear to be due to CHF. - Start low-dose Lasix  when necessary.     Procedures: 2-D echo 03/25/15: Study Conclusions  - Left ventricle: Wall thickness was increased in a pattern of mild   LVH. Systolic function was normal. The estimated ejection   fraction was in the range of 60% to 65%. Wall motion was normal;   there were no regional wall motion abnormalities. Doppler   parameters are consistent with abnormal left ventricular   relaxation (grade 1 diastolic dysfunction).        03/27/2015 1st [facility] office visit/ Hyun Marsalis   Chief Complaint  Patient presents with   Advice Only    SOB at all times x 1 week; cough, prod at  times;   still smoking but working on quiting 75% better since d/c but using saba around the clock in hfa and neb form  Onset was insidious/pattern progressive x sev weeks to a month pta rec The key is to stop smoking completely before smoking completely stops you!  Finish your prednisone  and levaquin  Plan A = automatic = Symbicort  160 Take 2 puffs first thing in am and then another 2 puffs about 12 hours later.  Plan B=backup Only use your albuterol  (proventil )  as a rescue medication Plan C  =Crisis = use nebulizer if you must up to every 4 hours if needed    12/01/2023  Re-establish  ov/[place] office/Sally Menard re: GOLD 2 copd  maint on [place] [address]  / resumed smoking / vaping and referred now by Dr Zollie re SPN on LDSCT (see below)  Chief Complaint  Patient presents with   Consult    [place] on CT Lung cancer screening.  C/o SOB with exertion/walking from house to tool building in yard.  Dyspnea:  house to tool shed easier p albuterol  / does [employer] fine / overall though very sedentary   Cough: none  Sleeping:  sleeps horizontally on couch 2 pillows s resp cc  SABA use: avg 0 - 2 x per day  02: none  Rec Plan A = Automatic = Always=    breztri  Take 2 puffs first thing in am and then another 2 puffs about 12 hours later.  Work on inhaler technique:   Plan B = Backup (to supplement plan A, not to replace it) Only use your albuterol  inhaler as a rescue medication  Plan C = Crisis (instead of Plan B but only if Plan B stops working) - only use your albuterol  nebulizer if you first try Plan B    Please schedule a follow up office visit in 6 weeks, call sooner if needed with all medications /inhalers/ solutions in hand    12/21/2023  f/u ov/[place] office/Shacoria Latif re: GOLD 2 copd/ SPN Pos PET RUL SPN  maint on Breztri   / still smoking / rx per [facility] Chief Complaint  Patient presents with   Follow-up    Review PET scan results. Continues to smoke- 1 ppd.   Dyspnea:  house to shed is 50 yards flat walking  better  / shopping fine no HC parking  Cough: none  Sleeping: flat surface of couch x 2 pillows s  resp cc  SABA use:  only with shower  02: none  Rec I will call to arrange lung bx as soon as possible  The key is to stop smoking completely before smoking completely stops you! Keep appt for pfts and I'll call you the results.  Please schedule a follow up visit in 3 months but call sooner if needed    07/30/2024  f/u ov/[place] office/Rashae Rother re: GOLD 2 copd  maint on  symbicort  160  still  smoker Chief Complaint  Patient presents with   COPD    Pft in feb -   Dyspnea:  breathing about the same down to shed and slt hill back up with less doe and usually say s now does not have to stop on way back up   Cough: minimal mucoid  Sleeping: sleeping on couch flat part with two pillows under head s resp cc  SABA use: no neb/ hfa sev times aday  02: none    No obvious day to day or daytime variability or assoc excess/ purulent sputum  or mucus plugs or hemoptysis or cp or chest tightness, subjective wheeze or overt sinus or hb symptoms.    Also denies any obvious fluctuation of symptoms with weather or environmental changes or other aggravating or alleviating factors except as outlined above   No unusual exposure hx or h/o childhood pna/ asthma or knowledge of premature birth.  Current Allergies, Complete Past Medical History, Past Surgical History, Family History, and Social History were reviewed in [employer] record.  ROS  The following are not active complaints unless bolded Hoarseness, sore throat, dysphagia, dental problems, itching, sneezing,  nasal congestion or discharge of excess mucus or purulent secretions, ear ache,   fever, chills, sweats, unintended wt loss or wt gain, classically pleuritic or exertional cp,  orthopnea pnd or arm/hand swelling  or leg swelling, presyncope, palpitations, abdominal pain, anorexia, nausea, vomiting, diarrhea  or change in bowel habits or change in bladder habits, change in stools or change in urine, dysuria, hematuria,  rash, arthralgias, visual complaints, headache, numbness, weakness or ataxia or problems with walking or coordination,  change in mood or  memory.        Current Meds  Medication Sig   acetaminophen  (TYLENOL ) 500 MG tablet Take 1,000 mg by mouth 2 (two) times daily as needed for moderate pain (pain score 4-6), fever or headache.   albuterol  (VENTOLIN  HFA) 108 (90 Base) MCG/ACT  inhaler INHALE 2 PUFFS INTO THE LUNGS TWICE A DAY   allopurinol  (ZYLOPRIM ) 100 MG tablet TAKE 1 TABLET (100 MG TOTAL) BY MOUTH DAILY. TO PREVENT GOUT   Apixaban Starter Pack, 10mg  and 5mg , (ELIQUIS DVT/PE STARTER PACK) Take 10 mg by mouth.   budesonide -formoterol  (SYMBICORT ) 160-4.5 MCG/ACT inhaler 2 puffs first thing in am and then another 2 puffs about 12 hours later.   budesonide -glycopyrrolate-formoterol  (BREZTRI  AEROSPHERE) 160-9-4.8 MCG/ACT AERO inhaler Inhale 2 puffs into the lungs in the morning and at bedtime for 1 day.   Continuous Blood Gluc Sensor (FREESTYLE LIBRE 2 SENSOR) MISC USE TO TEST BLOOD SUGAR CONTINUOUSLY. Dx. E11.65 SEND TO [facility] FOR MEDICARE COVERAGE, DO NOT SEND TO LOCAL PHARMACY   ipratropium-albuterol  (DUONEB) 0.5-2.5 (3) MG/3ML SOLN Take 3 mLs by nebulization every 4 (four) hours as needed (SOB).   meloxicam  (MOBIC ) 15 MG tablet TAKE 1 TABLET (15 MG TOTAL) BY MOUTH DAILY.   metFORMIN  (GLUCOPHAGE -XR) 750 MG 24 hr tablet TAKE 1 TABLET BY MOUTH EVERY DAY WITH BREAKFAST   potassium chloride  SA (KLOR-CON  M20) 20 MEQ tablet TAKE 1 TABLET (20 MEQ TOTAL) DAILY BY MOUTH.   tamsulosin  (FLOMAX ) 0.4 MG CAPS capsule TAKE 2 CAPSULES BY MOUTH AT BEDTIME FOR URINE FLOW AND PROSTATE               Objective:   Physical Exam   Wts  07/30/2024          253  12/21/2023            281   12/01/2023     279  05/01/2015       261    03/27/15 262 lb (118.842 kg)  03/26/15 249 lb 5.4 oz (113.1 kg)  03/23/15 246 lb (111.585 kg)    Vital signs reviewed  07/30/2024  - Note at rest 02 sats  93% on RA   General appearance:  Mod (by bmi)   obese wm nad / clear lungs    HEENT : Oropharynx  clear  Nasal turbinates nl    NECK :  without  apparent  JVD/ palpable Nodes/TM    LUNGS: no acc muscle use,  Mild barrel  contour chest wall with bilateral  Distant bs s audible wheeze and  without cough on insp or exp maneuvers  and mild  Hyperresonant  to  percussion bilaterally      CV:  RRR  no s3 or murmur or increase in P2, and no edema   ABD:  soft and nontender with pos end  insp Hoover's  in the supine position.  No bruits or organomegaly appreciated   MS:  Nl gait/ ext warm without deformities Or obvious joint restrictions  calf tenderness, cyanosis or clubbing     SKIN: warm and dry without lesions    NEURO:  alert, approp, nl sensorium with  no motor or cerebellar deficits apparent.       Assessment:           Assessment & Plan Lung nodules See below COPD GOLD 2 Active smoker  - PFTs  05/01/2015  FEV1  2.50 (69%) ratio 69 p 11% improvement from saba  p am symbicort  160 and dlco 82/ erv 45 @ wt = 261 lb   - Chest LDSCT     11/03/23  Centrilobular emphsyema  - 12/01/2023  After extensive coaching inhaler device,  effectiveness =    75% from a baseline < 35%   >>> breztri  trial plus more approp saba use  - repeat PFTS 12/01/2023 >>> not done as of 12/21/2023  - 12/21/2023  After extensive coaching inhaler device,  effectiveness =  75% (unsteady insp effort) > continue breatri 2 bid  pending PFTs for 01/01/24 - PFT's  02/13/24   FEV1 1.98(56 % ) ratio 0.56  p 2 % improvement from saba p breztri  prior to study with DLCO  21.80 (94%)   and FV curve mildly concave   - 07/30/2024  After extensive coaching inhaler device,  effectiveness =    80% hfa wants to try breztri  again if affordable    Group D (now reclassified as E) in terms of symptom/risk and laba/lama/ICS  therefore appropriate rx at this point >>>  breztri  plus approp sba:  Re SABA :  I spent extra time with pt today reviewing appropriate use of albuterol  for prn use on exertion with the following points: 1) saba is for relief of sob that does not improve by walking a slower pace or resting but rather if the pt does not improve after trying this first. 2) If the pt is convinced, as many are, that saba helps recover from activity faster then it's easy to tell if this is the case by re-challenging : ie  stop, take the inhaler, then p 5 minutes try the exact same activity (intensity of workload) that just caused the symptoms and see if they are substantially diminished or not after saba 3) if there is an activity that reproducibly causes the symptoms, try the saba 15 min before the activity on alternate days   If in fact the saba really does help, then fine to continue to use it prn but advised may need to look closer at the maintenance regimen (symbicort  vs breztri  trial) being used to achieve better control of airways disease with exertion.    Malignant neoplasm of unspecified part of unspecified bronchus or lung (HCC) F/u at [facility] Cigarette smoker 4  min discussion re active cigarette smoking in addition to office E&M  Ask about tobacco use:   ongoing  Advise quitting   I took  an extended  opportunity with this patient to outline the consequences of continued cigarette use  in airway disorders based on all the data we have from the multiple national lung health studies (perfomed over decades at millions of dollars in cost)  indicating that smoking cessation, not choice of inhalers or pulmonary physicians, is the most important aspect of his care.   Assess willingness:  Not fully committed at this point Assist in quit attempt:  Per PCP when ready Arrange follow up:   Follow up per Primary Care planned           Patient Instructions  Ok to try Breztri  Take 2 puffs first thing in am and then another 2 puffs about 12 hours later.    Call me for prescription if you prefer it over symbiocort   The key is to stop smoking completely before smoking completely stops you!   Please schedule a follow up visit in 3 months but call sooner if needed    Ozell America, MD 07/30/2024

## 2024-07-30 ENCOUNTER — Encounter: Payer: Self-pay | Admitting: Internal Medicine

## 2024-07-30 ENCOUNTER — Ambulatory Visit: Admitting: Internal Medicine

## 2024-07-30 VITALS — BP 112/76 | HR 85 | Ht 70.0 in | Wt 253.2 lb

## 2024-07-30 DIAGNOSIS — R918 Other nonspecific abnormal finding of lung field: Secondary | ICD-10-CM

## 2024-07-30 DIAGNOSIS — C3490 Malignant neoplasm of unspecified part of unspecified bronchus or lung: Secondary | ICD-10-CM | POA: Diagnosis not present

## 2024-07-30 DIAGNOSIS — F17210 Nicotine dependence, cigarettes, uncomplicated: Secondary | ICD-10-CM | POA: Diagnosis not present

## 2024-07-30 DIAGNOSIS — J449 Chronic obstructive pulmonary disease, unspecified: Secondary | ICD-10-CM | POA: Diagnosis not present

## 2024-07-30 MED ORDER — BREZTRI AEROSPHERE 160-9-4.8 MCG/ACT IN AERO: 2.0000 | INHALATION_SPRAY | Freq: Two times a day (BID) | RESPIRATORY_TRACT | Status: AC

## 2024-07-30 NOTE — Patient Instructions (Signed)
 Ok to try Breztri  Take 2 puffs first thing in am and then another 2 puffs about 12 hours later.    Call me for prescription if you prefer it over symbiocort   The key is to stop smoking completely before smoking completely stops you!   Please schedule a follow up visit in 3 months but call sooner if needed

## 2024-07-30 NOTE — Assessment & Plan Note (Addendum)
 Active smoker  - PFTs  05/01/2015  FEV1  2.50 (69%) ratio 69 p 11% improvement from saba  p am symbicort  160 and dlco 82/ erv 45 @ wt = 261 lb   - Chest LDSCT     11/03/23  Centrilobular emphsyema  - 12/01/2023  After extensive coaching inhaler device,  effectiveness =    75% from a baseline < 35%   >>> breztri  trial plus more approp saba use  - repeat PFTS 12/01/2023 >>> not done as of 12/21/2023  - 12/21/2023  After extensive coaching inhaler device,  effectiveness =  75% (unsteady insp effort) > continue breatri 2 bid  pending PFTs for 01/01/24 - PFT's  02/13/24   FEV1 1.98(56 % ) ratio 0.56  p 2 % improvement from saba p breztri  prior to study with DLCO  21.80 (94%)   and FV curve mildly concave   - 07/30/2024  After extensive coaching inhaler device,  effectiveness =    80% hfa wants to try breztri  again if affordable    Group D (now reclassified as E) in terms of symptom/risk and laba/lama/ICS  therefore appropriate rx at this point >>>  breztri  plus approp sba:  Re SABA :  I spent extra time with pt today reviewing appropriate use of albuterol  for prn use on exertion with the following points: 1) saba is for relief of sob that does not improve by walking a slower pace or resting but rather if the pt does not improve after trying this first. 2) If the pt is convinced, as many are, that saba helps recover from activity faster then it's easy to tell if this is the case by re-challenging : ie stop, take the inhaler, then p 5 minutes try the exact same activity (intensity of workload) that just caused the symptoms and see if they are substantially diminished or not after saba 3) if there is an activity that reproducibly causes the symptoms, try the saba 15 min before the activity on alternate days   If in fact the saba really does help, then fine to continue to use it prn but advised may need to look closer at the maintenance regimen (symbicort  vs breztri  trial) being used to achieve better control of  airways disease with exertion.

## 2024-07-30 NOTE — Assessment & Plan Note (Addendum)
 4  min discussion re active cigarette smoking in addition to office E&M  Ask about tobacco use:   ongoing  Advise quitting   I took an extended  opportunity with this patient to outline the consequences of continued cigarette use  in airway disorders based on all the data we have from the multiple national lung health studies (perfomed over decades at millions of dollars in cost)  indicating that smoking cessation, not choice of inhalers or pulmonary physicians, is the most important aspect of his care.   Assess willingness:  Not fully committed at this point Assist in quit attempt:  Per PCP when ready Arrange follow up:   Follow up per Primary Care planned

## 2024-08-07 ENCOUNTER — Encounter: Payer: Self-pay | Admitting: Family Medicine

## 2024-08-07 ENCOUNTER — Ambulatory Visit (INDEPENDENT_AMBULATORY_CARE_PROVIDER_SITE_OTHER): Admitting: Family Medicine

## 2024-08-07 VITALS — BP 119/80 | HR 96 | Temp 97.7°F | Ht 70.0 in | Wt 252.4 lb

## 2024-08-07 DIAGNOSIS — Z85118 Personal history of other malignant neoplasm of bronchus and lung: Secondary | ICD-10-CM | POA: Diagnosis not present

## 2024-08-07 DIAGNOSIS — K2100 Gastro-esophageal reflux disease with esophagitis, without bleeding: Secondary | ICD-10-CM

## 2024-08-07 DIAGNOSIS — C3411 Malignant neoplasm of upper lobe, right bronchus or lung: Secondary | ICD-10-CM

## 2024-08-07 DIAGNOSIS — Z794 Long term (current) use of insulin: Secondary | ICD-10-CM

## 2024-08-07 DIAGNOSIS — E1140 Type 2 diabetes mellitus with diabetic neuropathy, unspecified: Secondary | ICD-10-CM | POA: Diagnosis not present

## 2024-08-07 DIAGNOSIS — I10 Essential (primary) hypertension: Secondary | ICD-10-CM

## 2024-08-07 DIAGNOSIS — E782 Mixed hyperlipidemia: Secondary | ICD-10-CM

## 2024-08-07 DIAGNOSIS — I82290 Acute embolism and thrombosis of other thoracic veins: Secondary | ICD-10-CM | POA: Diagnosis not present

## 2024-08-07 DIAGNOSIS — J432 Centrilobular emphysema: Secondary | ICD-10-CM | POA: Diagnosis not present

## 2024-08-07 DIAGNOSIS — E1165 Type 2 diabetes mellitus with hyperglycemia: Secondary | ICD-10-CM | POA: Diagnosis not present

## 2024-08-07 DIAGNOSIS — R911 Solitary pulmonary nodule: Secondary | ICD-10-CM | POA: Diagnosis not present

## 2024-08-07 LAB — LIPID PANEL

## 2024-08-07 LAB — BAYER DCA HB A1C WAIVED: HB A1C (BAYER DCA - WAIVED): 5.6 % (ref 4.8–5.6)

## 2024-08-07 MED ORDER — POTASSIUM CHLORIDE CRYS ER 20 MEQ PO TBCR: EXTENDED_RELEASE_TABLET | ORAL | 3 refills | Status: DC

## 2024-08-07 MED ORDER — PANTOPRAZOLE SODIUM 40 MG PO TBEC: 40.0000 mg | DELAYED_RELEASE_TABLET | Freq: Every day | ORAL | 11 refills | Status: DC

## 2024-08-07 MED ORDER — METFORMIN HCL ER 750 MG PO TB24: 750.0000 mg | ORAL_TABLET | Freq: Every day | ORAL | 3 refills | Status: DC

## 2024-08-07 NOTE — Progress Notes (Signed)
 Subjective:  Patient ID: Juan Merritt,  male    DOB: March 08, 1951  Age: 73 y.o.    CC: Medical Management of Chronic Issues   HPI Juan Merritt presents for  follow-up of hypertension. Patient has no history of headache chest pain or shortness of breath or recent cough. Patient also denies symptoms of TIA such as numbness weakness lateralizing. Patient denies side effects from medication. States taking it regularly.  Patient also  in for follow-up of elevated cholesterol. Doing well without complaints on current medication. Denies side effects  including myalgia and arthralgia and nausea. Also in today for liver function testing. Currently no chest pain, shortness of breath or other cardiovascular related symptoms noted.  Follow-up of diabetes. Patient does check blood sugar at home. Patient denies symptoms such as excessive hunger or urinary frequency, excessive hunger, nausea No significant hypoglycemic spells noted. Medications reviewed. Pt reports taking them regularly. Pt. denies complication/adverse reaction today.  Discussed the use of AI scribe software for clinical note transcription with the patient, who gave verbal consent to proceed.  History of Present Illness   Juan Merritt is a 73 year old male with lung cancer who presents for a follow-up visit and CT scan evaluation.  He is undergoing regular CT scans every three months to monitor lung cancer. The last CT scan was on May 20th, and he is scheduled for another scan today. He is awaiting the CT scan results before proceeding with immune system treatment. He is currently on blood thinners, which cost $150 per month, and has received another prescription. He declined Coumadin due to the frequent blood work required.  His blood pressure has been stable, with an average reading of 121/63 mmHg, and he is not taking any medication for it. His blood sugar levels have been stable, with a recent reading of 108 mg/dL. He is  taking metformin  and reports no episodes of hypoglycemia. His A1c is 5.6%, indicating good diabetes control. He has not experienced significant weight loss, with a current weight of 252 lbs, down from 253 lbs in May. He is taking allopurinol , metformin , and potassium, and has stopped meloxicam  due to blood thinner use.  He experiences a burning sensation, particularly when consuming spicy foods, which he attributes to radiation treatment. He describes it as similar to heartburn and has never experienced heartburn before. He is starting pantoprazole  for heartburn.  He reports a sensation of walking on foam rubber, particularly in the right foot, and some numbness at the tips of the first couple of toes. He has a history of fungal infection in the toenails.       History Juan Merritt has a past medical history of Arthritis, BPH associated with nocturia, Cancer (HCC), Chronic gout, COPD (chronic obstructive pulmonary disease) (HCC), History of colon polyps, History of COVID-19, Hyperlipidemia, mixed, Hypertension, Phimosis, and Type 2 diabetes mellitus (HCC).   He has a past surgical history that includes Laparoscopic cholecystectomy (2008); Tonsillectomy; Colonoscopy (12/01/2021); Circumcision (N/A, 05/19/2022); Endobronchial ultrasound (01/22/2024); Bronchial needle aspiration biopsy (01/22/2024); Bronchial biopsy (01/22/2024); Bronchial brushings (01/22/2024); Video bronchoscopy with radial endobronchial ultrasound (01/22/2024); Fine needle aspiration (01/22/2024); and Left heart cath.   His family history includes Asthma in his mother; Diabetes in his father; Esophageal cancer in his paternal uncle; Heart attack in an other family member; Heart attack (age of onset: 73) in his father; Leukemia in his brother; Lung disease in an other family member.He reports that he has been smoking cigarettes. He started smoking about  47 years ago. He has a 10 pack-year smoking history. His smokeless tobacco use includes  chew. He reports current alcohol use of about 24.0 standard drinks of alcohol per week. He reports that he does not use drugs.  Current Outpatient Medications on File Prior to Visit  Medication Sig Dispense Refill   acetaminophen  (TYLENOL ) 500 MG tablet Take 1,000 mg by mouth 2 (two) times daily as needed for moderate pain (pain score 4-6), fever or headache.     albuterol  (VENTOLIN  HFA) 108 (90 Base) MCG/ACT inhaler INHALE 2 PUFFS INTO THE LUNGS TWICE A DAY 18 each 5   allopurinol  (ZYLOPRIM ) 100 MG tablet TAKE 1 TABLET (100 MG TOTAL) BY MOUTH DAILY. TO PREVENT GOUT 90 tablet 0   Apixaban Starter Pack, 10mg  and 5mg , (ELIQUIS DVT/PE STARTER PACK) Take 10 mg by mouth.     budesonide -formoterol  (SYMBICORT ) 160-4.5 MCG/ACT inhaler 2 puffs first thing in am and then another 2 puffs about 12 hours later. 1 each 6   Continuous Blood Gluc Sensor (FREESTYLE LIBRE 2 SENSOR) MISC USE TO TEST BLOOD SUGAR CONTINUOUSLY. Dx. E11.65 SEND TO [facility] FOR MEDICARE COVERAGE, DO NOT SEND TO LOCAL PHARMACY 2 each 0   ipratropium-albuterol  (DUONEB) 0.5-2.5 (3) MG/3ML SOLN Take 3 mLs by nebulization every 4 (four) hours as needed (SOB).     tamsulosin  (FLOMAX ) 0.4 MG CAPS capsule TAKE 2 CAPSULES BY MOUTH AT BEDTIME FOR URINE FLOW AND PROSTATE 180 capsule 2   No current facility-administered medications on file prior to visit.    ROS Review of Systems  Constitutional:  Negative for fever.  Respiratory:  Negative for shortness of breath.   Cardiovascular:  Negative for chest pain.  Musculoskeletal:  Negative for arthralgias.  Skin:  Negative for rash.    Objective:  BP 119/80   Pulse 96   Temp 97.7 F (36.5 C)   Ht 5' 10 (1.778 m)   Wt 252 lb 6.4 oz (114.5 kg)   SpO2 96%   BMI 36.22 kg/m   BP Readings from Last 3 Encounters:  08/07/24 119/80  07/30/24 112/76  05/07/24 113/67    Wt Readings from Last 3 Encounters:  08/07/24 252 lb 6.4 oz (114.5 kg)  07/30/24 253 lb 3.2 oz (114.9 kg)   05/07/24 253 lb (114.8 kg)    Lab Results  Component Value Date   HGBA1C 5.6 08/07/2024   HGBA1C 5.8 (H) 01/11/2024   HGBA1C 5.8 (H) 09/20/2023    Physical Exam Vitals reviewed.  Constitutional:      Appearance: He is well-developed.  HENT:     Head: Normocephalic and atraumatic.     Right Ear: External ear normal.     Left Ear: External ear normal.     Mouth/Throat:     Pharynx: No oropharyngeal exudate or posterior oropharyngeal erythema.  Eyes:     Pupils: Pupils are equal, round, and reactive to light.  Cardiovascular:     Rate and Rhythm: Normal rate and regular rhythm.     Heart sounds: No murmur heard. Pulmonary:     Effort: No respiratory distress.     Breath sounds: Normal breath sounds.  Musculoskeletal:     Cervical back: Normal range of motion and neck supple.  Neurological:     Mental Status: He is alert and oriented to person, place, and time.         Assessment & Plan:  Mixed hyperlipidemia -     Lipid panel  Essential hypertension, benign -  Potassium Chloride  Crys ER; TAKE 1 TABLET (20 MEQ TOTAL) DAILY BY MOUTH.  Dispense: 90 tablet; Refill: 3 -     CMP14+EGFR  Type 2 diabetes mellitus with diabetic neuropathy, with long-term current use of insulin  (HCC) -     Bayer DCA Hb A1c Waived -     CBC with Differential/Platelet -     CMP14+EGFR -     Microalbumin / creatinine urine ratio  Malignant neoplasm of upper lobe of right lung (HCC)  Gastroesophageal reflux disease with esophagitis without hemorrhage  Other orders -     metFORMIN  HCl ER; Take 1 tablet (750 mg total) by mouth daily with breakfast.  Dispense: 90 tablet; Refill: 3 -     Pantoprazole  Sodium; Take 1 tablet (40 mg total) by mouth daily. For stomach  Dispense: 30 tablet; Refill: 11  Assessment and Plan    Lung cancer under active treatment Lung cancer under active treatment with periodic CT scans. Awaiting CT scan to determine further immune system treatments. Radiation  therapy caused residual burning sensation. - Order CT scan to assess lung cancer status. - Continue immune system treatments as indicated by CT results.  Type 2 diabetes mellitus, well controlled Type 2 diabetes well controlled with A1c of 5.6. Blood sugar stable without hypoglycemia. On metformin  with weight stability. - Continue metformin .  Peripheral neuropathy of right foot Peripheral neuropathy in right foot with numbness, likely related to diabetes. - Refer to podiatrist for further evaluation and management of foot care.  Onychomycosis of right great and second toe Onychomycosis in right great and second toe with significant fungal damage. - Refer to podiatrist for evaluation and potential nail removal.  Gastroesophageal reflux symptoms post-radiation Gastroesophageal reflux symptoms likely exacerbated by radiation treatment. - Prescribe pantoprazole  for heartburn management.  Essential hypertension, well controlled Essential hypertension well controlled with current management.  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 Prefers current medication over alternatives. - Continue current anticoagulation therapy.        Follow-up: Return in about 3 months (around 11/07/2024).  Butler Der, M.D.

## 2024-08-08 LAB — CBC WITH DIFFERENTIAL/PLATELET
Basophils Absolute: 0 x10E3/uL (ref 0.0–0.2)
Basos: 0 %
EOS (ABSOLUTE): 0.2 x10E3/uL (ref 0.0–0.4)
Eos: 5 %
Hematocrit: 38.4 % (ref 37.5–51.0)
Hemoglobin: 12.9 g/dL — ABNORMAL LOW (ref 13.0–17.7)
Immature Grans (Abs): 0 x10E3/uL (ref 0.0–0.1)
Immature Granulocytes: 0 %
Lymphocytes Absolute: 0.9 x10E3/uL (ref 0.7–3.1)
Lymphs: 24 %
MCH: 35.6 pg — ABNORMAL HIGH (ref 26.6–33.0)
MCHC: 33.6 g/dL (ref 31.5–35.7)
MCV: 106 fL — ABNORMAL HIGH (ref 79–97)
Monocytes Absolute: 0.5 x10E3/uL (ref 0.1–0.9)
Monocytes: 12 %
Neutrophils Absolute: 2.3 x10E3/uL (ref 1.4–7.0)
Neutrophils: 59 %
Platelets: 180 x10E3/uL (ref 150–450)
RBC: 3.62 x10E6/uL — ABNORMAL LOW (ref 4.14–5.80)
RDW: 13.1 % (ref 11.6–15.4)
WBC: 4 x10E3/uL (ref 3.4–10.8)

## 2024-08-08 LAB — CMP14+EGFR
ALT: 55 IU/L — AB (ref 0–44)
AST: 55 IU/L — AB (ref 0–40)
Albumin: 3.8 g/dL (ref 3.8–4.8)
Alkaline Phosphatase: 67 IU/L (ref 44–121)
BUN/Creatinine Ratio: 15 (ref 10–24)
BUN: 16 mg/dL (ref 8–27)
Bilirubin Total: 0.5 mg/dL (ref 0.0–1.2)
CO2: 24 mmol/L (ref 20–29)
Calcium: 8.8 mg/dL (ref 8.6–10.2)
Chloride: 98 mmol/L (ref 96–106)
Creatinine, Ser: 1.09 mg/dL (ref 0.76–1.27)
Globulin, Total: 3 g/dL (ref 1.5–4.5)
Glucose: 108 mg/dL — AB (ref 70–99)
Potassium: 3.1 mmol/L — AB (ref 3.5–5.2)
Sodium: 137 mmol/L (ref 134–144)
Total Protein: 6.8 g/dL (ref 6.0–8.5)
eGFR: 73 mL/min/1.73 (ref >59)

## 2024-08-08 LAB — MICROALBUMIN / CREATININE URINE RATIO
Creatinine, Urine: 182.6 mg/dL
Microalb/Creat Ratio: 5 mg/g{creat} (ref 0–29)
Microalbumin, Urine: 9 ug/mL

## 2024-08-08 LAB — LIPID PANEL
Cholesterol, Total: 159 mg/dL (ref 100–199)
HDL: 50 mg/dL (ref >39)
LDL CALC COMMENT:: 3.2 ratio (ref 0.0–5.0)
LDL Chol Calc (NIH): 89 mg/dL (ref 0–99)
Triglycerides: 111 mg/dL (ref 0–149)
VLDL Cholesterol Cal: 20 mg/dL (ref 5–40)

## 2024-08-12 ENCOUNTER — Ambulatory Visit: Payer: Self-pay | Admitting: Family Medicine

## 2024-08-13 NOTE — Telephone Encounter (Signed)
 Looks like patient may have already seen his results via mychart but I did call and leave a message for wife to call back if she or patient needs lab results and/or if they have questions or concerns.

## 2024-08-14 DIAGNOSIS — H524 Presbyopia: Secondary | ICD-10-CM | POA: Diagnosis not present

## 2024-08-14 DIAGNOSIS — Z7901 Long term (current) use of anticoagulants: Secondary | ICD-10-CM | POA: Diagnosis not present

## 2024-08-14 DIAGNOSIS — E876 Hypokalemia: Secondary | ICD-10-CM | POA: Diagnosis not present

## 2024-08-14 DIAGNOSIS — R7401 Elevation of levels of liver transaminase levels: Secondary | ICD-10-CM | POA: Diagnosis not present

## 2024-08-14 DIAGNOSIS — H5203 Hypermetropia, bilateral: Secondary | ICD-10-CM | POA: Diagnosis not present

## 2024-08-14 DIAGNOSIS — C799 Secondary malignant neoplasm of unspecified site: Secondary | ICD-10-CM | POA: Diagnosis not present

## 2024-08-14 DIAGNOSIS — K769 Liver disease, unspecified: Secondary | ICD-10-CM | POA: Diagnosis not present

## 2024-08-14 DIAGNOSIS — D539 Nutritional anemia, unspecified: Secondary | ICD-10-CM | POA: Diagnosis not present

## 2024-08-14 DIAGNOSIS — H52223 Regular astigmatism, bilateral: Secondary | ICD-10-CM | POA: Diagnosis not present

## 2024-08-14 DIAGNOSIS — I82890 Acute embolism and thrombosis of other specified veins: Secondary | ICD-10-CM | POA: Diagnosis not present

## 2024-08-14 DIAGNOSIS — R7989 Other specified abnormal findings of blood chemistry: Secondary | ICD-10-CM | POA: Diagnosis not present

## 2024-08-14 DIAGNOSIS — E119 Type 2 diabetes mellitus without complications: Secondary | ICD-10-CM | POA: Diagnosis not present

## 2024-08-14 DIAGNOSIS — B449 Aspergillosis, unspecified: Secondary | ICD-10-CM | POA: Diagnosis not present

## 2024-08-14 DIAGNOSIS — C3491 Malignant neoplasm of unspecified part of right bronchus or lung: Secondary | ICD-10-CM | POA: Diagnosis not present

## 2024-08-14 DIAGNOSIS — H2513 Age-related nuclear cataract, bilateral: Secondary | ICD-10-CM | POA: Diagnosis not present

## 2024-08-15 DIAGNOSIS — C799 Secondary malignant neoplasm of unspecified site: Secondary | ICD-10-CM | POA: Diagnosis not present

## 2024-08-15 DIAGNOSIS — Z5112 Encounter for antineoplastic immunotherapy: Secondary | ICD-10-CM | POA: Diagnosis not present

## 2024-08-15 DIAGNOSIS — C3411 Malignant neoplasm of upper lobe, right bronchus or lung: Secondary | ICD-10-CM | POA: Diagnosis not present

## 2024-08-22 ENCOUNTER — Ambulatory Visit (INDEPENDENT_AMBULATORY_CARE_PROVIDER_SITE_OTHER): Payer: Medicare HMO

## 2024-08-22 VITALS — BP 119/80 | HR 96 | Ht 70.0 in | Wt 252.0 lb

## 2024-08-22 DIAGNOSIS — Z0000 Encounter for general adult medical examination without abnormal findings: Secondary | ICD-10-CM

## 2024-08-22 NOTE — Progress Notes (Signed)
 Subjective:   Juan Merritt is a 73 y.o. who presents for a Medicare Wellness preventive visit.  As a reminder, Annual Wellness Visits don't include a physical exam, and some assessments may be limited, especially if this visit is performed virtually. We may recommend an in-person follow-up visit with your provider if needed.  Visit Complete: Virtual I connected with  Juan Merritt on 08/22/24 by a audio enabled telemedicine application and verified that I am speaking with the correct person using two identifiers.  Patient Location: Home  Provider Location: Home Office  I discussed the limitations of evaluation and management by telemedicine. The patient expressed understanding and agreed to proceed.  Vital Signs: Because this visit was a virtual/telehealth visit, some criteria may be missing or patient reported. Any vitals not documented were not able to be obtained and vitals that have been documented are patient reported.  VideoDeclined- This patient declined Librarian, academic. Therefore the visit was completed with audio only.  Persons Participating in Visit: Patient.  AWV Questionnaire: No: Patient Medicare AWV questionnaire was not completed prior to this visit.  Cardiac Risk Factors include: advanced age (>41men, >77 women);diabetes mellitus;dyslipidemia;hypertension;obesity (BMI >30kg/m2);smoking/ tobacco exposure;male gender     Objective:    Today's Vitals   08/22/24 0918  BP: 119/80  Pulse: 96  Weight: 252 lb (114.3 kg)  Height: 5' 10 (1.778 m)   Body mass index is 36.16 kg/m.     08/22/2024    9:23 AM 03/31/2024    4:54 AM 01/31/2024   12:39 PM 01/22/2024    7:17 AM 08/22/2023    9:14 AM 08/19/2022    9:13 AM 05/19/2022   10:54 AM  Advanced Directives  Does Patient Have a Medical Advance Directive? Yes Yes Yes Yes Yes Yes Yes  Type of Estate agent of [place];Living will Healthcare Power of [place];Living  will Living will;Healthcare Power of Attorney Living will;Healthcare Power of [employer] Power of [place];Living will Healthcare Power of [place];Living will Healthcare Power of [place];Living will  Does patient want to make changes to medical advance directive?   No - Patient declined      Copy of Healthcare Power of Attorney in Chart? No - copy requested    No - copy requested No - copy requested No - copy requested    Current Medications (verified) Outpatient Encounter Medications as of 08/22/2024  Medication Sig   acetaminophen  (TYLENOL ) 500 MG tablet Take 1,000 mg by mouth 2 (two) times daily as needed for moderate pain (pain score 4-6), fever or headache.   albuterol  (VENTOLIN  HFA) 108 (90 Base) MCG/ACT inhaler INHALE 2 PUFFS INTO THE LUNGS TWICE A DAY   allopurinol  (ZYLOPRIM ) 100 MG tablet TAKE 1 TABLET (100 MG TOTAL) BY MOUTH DAILY. TO PREVENT GOUT   Apixaban Starter Pack, 10mg  and 5mg , (ELIQUIS DVT/PE STARTER PACK) Take 10 mg by mouth.   budesonide -formoterol  (SYMBICORT ) 160-4.5 MCG/ACT inhaler 2 puffs first thing in am and then another 2 puffs about 12 hours later.   Continuous Blood Gluc Sensor (FREESTYLE LIBRE 2 SENSOR) MISC USE TO TEST BLOOD SUGAR CONTINUOUSLY. Dx. E11.65 SEND TO [facility] FOR MEDICARE COVERAGE, DO NOT SEND TO LOCAL PHARMACY   ipratropium-albuterol  (DUONEB) 0.5-2.5 (3) MG/3ML SOLN Take 3 mLs by nebulization every 4 (four) hours as needed (SOB).   metFORMIN  (GLUCOPHAGE -XR) 750 MG 24 hr tablet Take 1 tablet (750 mg total) by mouth daily with breakfast.   pantoprazole  (PROTONIX ) 40 MG tablet Take 1  tablet (40 mg total) by mouth daily. For stomach   potassium chloride  SA (KLOR-CON  M20) 20 MEQ tablet TAKE 1 TABLET (20 MEQ TOTAL) DAILY BY MOUTH.   tamsulosin  (FLOMAX ) 0.4 MG CAPS capsule TAKE 2 CAPSULES BY MOUTH AT BEDTIME FOR URINE FLOW AND PROSTATE   No facility-administered encounter medications on file as of 08/22/2024.    Allergies (verified) Patient  has no known allergies.   History: Past Medical History:  Diagnosis Date   Arthritis    BPH associated with nocturia    Cancer (HCC)    stage III non-small cell lung cancer.   Chronic gout    COPD (chronic obstructive pulmonary disease) (HCC)    not on home o2   History of colon polyps    History of COVID-19    04/ 2019 hospital admission in epic, severe sepsis due to covid w/ acute respiratory failure;   and 05/ 2021 mild to moderate symptoms, recovered at home   Hyperlipidemia, mixed    Hypertension    Phimosis    Type 2 diabetes mellitus (HCC)    followed by pcp   (05-17-2022  pt stated checks sugar multiple times daily w/ Libre,  fasting average --- 100-110)   Past Surgical History:  Procedure Laterality Date   BRONCHIAL BIOPSY  01/22/2024   Procedure: BRONCHIAL BIOPSIES;  Surgeon: Shelah Lamar RAMAN, MD;  Location: [facility] ENDOSCOPY;  Service: Pulmonary;;   BRONCHIAL BRUSHINGS  01/22/2024   Procedure: BRONCHIAL BRUSHINGS;  Surgeon: Shelah Lamar RAMAN, MD;  Location: [facility] ENDOSCOPY;  Service: Pulmonary;;   BRONCHIAL NEEDLE ASPIRATION BIOPSY  01/22/2024   Procedure: BRONCHIAL NEEDLE ASPIRATION BIOPSIES;  Surgeon: Shelah Lamar RAMAN, MD;  Location: [facility] ENDOSCOPY;  Service: Pulmonary;;   CIRCUMCISION N/A 05/19/2022   Procedure: CIRCUMCISION ADULT;  Surgeon: Matilda Senior, MD;  Location: [facility];  Service: Urology;  Laterality: N/A;  45 MINUTES   COLONOSCOPY  12/01/2021   by dr legrand   ENDOBRONCHIAL ULTRASOUND  01/22/2024   Procedure: ENDOBRONCHIAL ULTRASOUND;  Surgeon: Shelah Lamar RAMAN, MD;  Location: [facility] ENDOSCOPY;  Service: Pulmonary;;   FINE NEEDLE ASPIRATION  01/22/2024   Procedure: FINE NEEDLE ASPIRATION (FNA) LINEAR;  Surgeon: Shelah Lamar RAMAN, MD;  Location: [facility] ENDOSCOPY;  Service: Pulmonary;;   LAPAROSCOPIC CHOLECYSTECTOMY  2008   in [place]   LEFT HEART CATH     TONSILLECTOMY     child   VIDEO BRONCHOSCOPY WITH RADIAL ENDOBRONCHIAL ULTRASOUND  01/22/2024    Procedure: VIDEO BRONCHOSCOPY WITH RADIAL ENDOBRONCHIAL ULTRASOUND;  Surgeon: Shelah Lamar RAMAN, MD;  Location: [facility] ENDOSCOPY;  Service: Pulmonary;;   Family History  Problem Relation Age of Onset   Asthma Mother    Diabetes Father    Heart attack Father 62   Leukemia Brother    Esophageal cancer Paternal Uncle    Lung disease Other        pat cousin   Heart attack Other        pat cousin   Colon cancer Neg Hx    Stomach cancer Neg Hx    Social History   Socioeconomic History   Marital status: Married    Spouse name: Madeline   Number of children: 5   Years of education: 10   Highest education level: 10th grade  Occupational History   Occupation: retired Naval architect  Tobacco Use   Smoking status: Every Day    Current packs/day: 0.00    Average packs/day: 0.3 packs/day for 40.0 years (10.0 ttl pk-yrs)  Types: Cigarettes    Start date: 01/15/1977    Last attempt to quit: 01/15/2017    Years since quitting: 7.6   Smokeless tobacco: Current    Types: Chew   Tobacco comments:    Smokes half to one pack per day for about 45 years - now also smokes e-cigarette sometimes and chews tobacco.  Updated 12/01/2023.  Vaping Use   Vaping status: Former   Substances: Nicotine    Devices: unsure  Substance and Sexual Activity   Alcohol use: Yes    Alcohol/week: 24.0 standard drinks of alcohol    Types: 24 Cans of beer per week   Drug use: Never   Sexual activity: Not Currently  Other Topics Concern   Not on file  Social History Narrative   Lives at home with wife.  Grown children.  Retired IT trainer. Children live nearby.    Social Drivers of Corporate investment banker Strain: Low Risk  (08/22/2024)   Overall Financial Resource Strain (CARDIA)    Difficulty of Paying Living Expenses: Not hard at all  Food Insecurity: No Food Insecurity (08/22/2024)   Hunger Vital Sign    Worried About Running Out of Food in the Last Year: Never true    Ran Out of Food in the Last Year: Never true   Transportation Needs: No Transportation Needs (08/22/2024)   PRAPARE - Administrator, Civil Service (Medical): No    Lack of Transportation (Non-Medical): No  Physical Activity: Inactive (08/22/2024)   Exercise Vital Sign    Days of Exercise per Week: 0 days    Minutes of Exercise per Session: 0 min  Stress: No Stress Concern Present (08/22/2024)   [employer] of Occupational Health - Occupational Stress Questionnaire    Feeling of Stress: Not at all  Social Connections: Moderately Isolated (02/23/2024)   Received from [facility]   Social Connection and Isolation Panel    In a typical week, how many times do you talk on the phone with family, friends, or neighbors?: More than three times a week    How often do you get together with friends or relatives?: Once a week    How often do you attend church or religious services?: Never    Do you belong to any clubs or organizations such as church groups, unions, fraternal or athletic groups, or school groups?: No    How often do you attend meetings of the clubs or organizations you belong to?: Never    Are you married, widowed, divorced, separated, never married, or living with a partner?: Married    Tobacco Counseling Ready to quit: No Counseling given: Yes Tobacco comments: Smokes half to one pack per day for about 45 years - now also smokes e-cigarette sometimes and chews tobacco.  Updated 12/01/2023.    Clinical Intake:  Pre-visit preparation completed: Yes  Pain : No/denies pain     BMI - recorded: 36.16 Nutritional Status: BMI > 30  Obese Nutritional Risks: None Diabetes: Yes  Lab Results  Component Value Date   HGBA1C 5.6 08/07/2024   HGBA1C 5.8 (H) 01/11/2024   HGBA1C 5.8 (H) 09/20/2023     How often do you need to have someone help you when you read instructions, pamphlets, or other written materials from your doctor or pharmacy?: 1 - Never  Interpreter Needed?: No  Information entered by ::  alia t/cma   Activities of Daily Living     08/22/2024    9:21 AM  In your present state of health, do you have any difficulty performing the following activities:  Hearing? 1  Vision? 0  Difficulty concentrating or making decisions? 0  Walking or climbing stairs? 0  Dressing or bathing? 0  Doing errands, shopping? 0  Preparing Food and eating ? N  Using the Toilet? N  In the past six months, have you accidently leaked urine? N  Do you have problems with loss of bowel control? N  Managing your Medications? N  Managing your Finances? Y  Comment pt's wife  Housekeeping or managing your Housekeeping? N    Patient Care Team: Zollie Lowers, MD as PCP - General (Family Medicine) Billee Mliss BIRCH, [facility] as Pharmacist (Family Medicine) Vicci Mcardle, OD (Optometry) Darlean Ozell NOVAK, MD as Consulting Physician (Pulmonary Disease)  I have updated your Care Teams any recent Medical Services you may have received from other providers in the past year.     Assessment:   This is a routine wellness examination for Juan Merritt.  Hearing/Vision screen Hearing Screening - Comments:: Pt has hearing aids Vision Screening - Comments:: Pt wear glasses for reading/pt [facility] in [place]/last ov last week 2025   Goals Addressed             This Visit's Progress    Exercise 3x per week (30 min per time)   On track      Depression Screen     08/22/2024    9:25 AM 08/07/2024    8:02 AM 01/11/2024    7:57 AM 09/20/2023    8:14 AM 08/22/2023    9:13 AM 06/07/2023    7:49 AM 03/01/2023    7:47 AM  PHQ 2/9 Scores  PHQ - 2 Score 0 0 0 0 0 0 0  PHQ- 9 Score   0        Fall Risk     08/22/2024    9:20 AM 08/07/2024    8:02 AM 09/20/2023    8:13 AM 08/22/2023    9:12 AM 06/07/2023    7:49 AM  Fall Risk   Falls in the past year? 0 0 0 0 0  Number falls in past yr: 0   0   Injury with Fall? 0   0   Risk for fall due to : No Fall Risks   No Fall Risks   Follow up Falls evaluation completed    Falls prevention discussed     MEDICARE RISK AT HOME:  Medicare Risk at Home Any stairs in or around the home?: Yes If so, are there any without handrails?: Yes Home free of loose throw rugs in walkways, pet beds, electrical cords, etc?: Yes Adequate lighting in your home to reduce risk of falls?: Yes Life alert?: No Use of a cane, walker or w/c?: No Grab bars in the bathroom?: Yes Shower chair or bench in shower?: Yes Elevated toilet seat or a handicapped toilet?: No  TIMED UP AND GO:  Was the test performed?  no  Cognitive Function: 6CIT completed        08/22/2024    9:25 AM 08/22/2023    9:15 AM 08/19/2022    9:14 AM 08/17/2020    8:41 AM 08/14/2019    8:45 AM  6CIT Screen  What Year? 0 points 0 points 0 points 0 points 0 points  What month? 0 points 0 points 0 points 0 points 0 points  What time? 0 points 0 points 0 points 0 points 0  points  Count back from 20 0 points 0 points 0 points 0 points 0 points  Months in reverse 0 points 0 points 0 points 0 points 2 points  Repeat phrase 0 points 0 points 2 points 0 points 2 points  Total Score 0 points 0 points 2 points 0 points 4 points    Immunizations Immunization History  Administered Date(s) Administered   Fluad Quad(high Dose 65+) 09/23/2019, 11/20/2020, 09/28/2021, 12/01/2022   Fluad Trivalent(High Dose 65+) 09/20/2023   Influenza,inj,Quad PF,6+ Mos 10/14/2016   Moderna SARS-COV2 Booster Vaccination 10/14/2020, 12/08/2021   Moderna Sars-Covid-2 Vaccination 02/26/2020, 03/25/2020   PFIZER(Purple Top)SARS-COV-2 Vaccination 06/04/2021   PNEUMOCOCCAL CONJUGATE-20 09/28/2021   Pneumococcal Polysaccharide-23 09/07/2015   Tdap 02/09/2024    Screening Tests Health Maintenance  Topic Date Due   Zoster Vaccines- Shingrix (1 of 2) Never done   OPHTHALMOLOGY EXAM  03/05/2024   FOOT EXAM  06/06/2024   COVID-19 Vaccine (4 - 2025-26 season) 08/19/2024   Colonoscopy  12/01/2024   INFLUENZA VACCINE  03/18/2025 (Originally  07/19/2024)   HEMOGLOBIN A1C  02/07/2025   Diabetic kidney evaluation - eGFR measurement  08/07/2025   Diabetic kidney evaluation - Urine ACR  08/07/2025   Medicare Annual Wellness (AWV)  08/22/2025   DTaP/Tdap/Td (2 - Td or Tdap) 02/08/2034   Pneumococcal Vaccine: 50+ Years  Completed   Hepatitis C Screening  Completed   HPV VACCINES  Aged Out   Meningococcal B Vaccine  Aged Out   Lung Cancer Screening  Discontinued   Fecal DNA (Cologuard)  Discontinued    Health Maintenance  Health Maintenance Due  Topic Date Due   Zoster Vaccines- Shingrix (1 of 2) Never done   OPHTHALMOLOGY EXAM  03/05/2024   FOOT EXAM  06/06/2024   COVID-19 Vaccine (4 - 2025-26 season) 08/19/2024   Colonoscopy  12/01/2024   Health Maintenance Items Addressed: See Nurse Notes at the end of this note  Additional Screening:  Vision Screening: Recommended annual ophthalmology exams for early detection of glaucoma and other disorders of the eye. Would you like a referral to an eye doctor? No    Dental Screening: Recommended annual dental exams for proper oral hygiene  Community Resource Referral / Chronic Care Management: CRR required this visit?  No   CCM required this visit?  No   Plan:    I have personally reviewed and noted the following in the patient's chart:   Medical and social history Use of alcohol, tobacco or illicit drugs  Current medications and supplements including opioid prescriptions. Patient is not currently taking opioid prescriptions. Functional ability and status Nutritional status Physical activity Advanced directives List of other physicians Hospitalizations, surgeries, and ER visits in previous 12 months Vitals Screenings to include cognitive, depression, and falls Referrals and appointments  In addition, I have reviewed and discussed with patient certain preventive protocols, quality metrics, and best practice recommendations. A written personalized care plan for  preventive services as well as general preventive health recommendations were provided to patient.   Juan Merritt, CMA   08/22/2024   After Visit Summary: (MyChart) Due to this being a telephonic visit, the after visit summary with patients personalized plan was offered to patient via MyChart   Notes: PCP Follow Up Recommendations: Pt is aware and due the following: colonoscopy--would like to talk to pcp first before getting another one done, per pt not supposed to get another one done until 2027, foot exam, Diabetic eye exam done last week-waiting for report

## 2024-08-22 NOTE — Patient Instructions (Addendum)
 Mr. Juan Merritt , Thank you for taking time out of your busy schedule to complete your Annual Wellness Visit with me. I enjoyed our conversation and look forward to speaking with you again next year. I, as well as your care team,  appreciate your ongoing commitment to your health goals. Please review the following plan we discussed and let me know if I can assist you in the future. Your Game plan/ To Do List    Referrals: If you haven't heard from the office you've been referred to, please reach out to them at the phone provided.   Follow up Visits: We will see or speak with you next year for your Next Medicare AWV with our clinical staff on 08/26/25 at 9:20a.m. Have you seen your provider in the last 6 months (3 months if uncontrolled diabetes)? Yes  Clinician Recommendations:  Aim for 30 minutes of exercise or brisk walking, 6-8 glasses of water, and 5 servings of fruits and vegetables each day.       This is a list of the screenings recommended for you:  Health Maintenance  Topic Date Due   Zoster (Shingles) Vaccine (1 of 2) Never done   Eye exam for diabetics  03/05/2024   Complete foot exam   06/06/2024   COVID-19 Vaccine (4 - 2025-26 season) 08/19/2024   Medicare Annual Wellness Visit  08/21/2024   Colon Cancer Screening  12/01/2024   Flu Shot  03/18/2025*   Hemoglobin A1C  02/07/2025   Yearly kidney function blood test for diabetes  08/07/2025   Yearly kidney health urinalysis for diabetes  08/07/2025   DTaP/Tdap/Td vaccine (2 - Td or Tdap) 02/08/2034   Pneumococcal Vaccine for age over 89  Completed   Hepatitis C Screening  Completed   HPV Vaccine  Aged Out   Meningitis B Vaccine  Aged Out   Screening for Lung Cancer  Discontinued   Cologuard (Stool DNA test)  Discontinued  *Topic was postponed. The date shown is not the original due date.    Advanced directives: (Declined) Advance directive discussed with you today. Even though you declined this today, please call our office  should you change your mind, and we can give you the proper paperwork for you to fill out. Advance Care Planning is important because it:  [x]  Makes sure you receive the medical care that is consistent with your values, goals, and preferences  [x]  It provides guidance to your family and loved ones and reduces their decisional burden about whether or not they are making the right decisions based on your wishes.  Follow the link provided in your after visit summary or read over the paperwork we have mailed to you to help you started getting your Advance Directives in place. If you need assistance in completing these, please reach out to us  so that we can help you!  See attachments for Preventive Care and Fall Prevention Tips.

## 2024-08-26 ENCOUNTER — Telehealth: Payer: Self-pay | Admitting: Family Medicine

## 2024-08-26 NOTE — Telephone Encounter (Unsigned)
 Copied from CRM [id number]. Topic: Clinical - Medication Refill >> Aug 26, 2024  4:26 PM Roselie C wrote: Medication:  Continuous Blood Gluc Sensor (FREESTYLE LIBRE 2 SENSOR) MISC patient needs the disk to be sent in   Has the patient contacted their pharmacy? Yes (Agent: If no, request that the patient contact the pharmacy for the refill. If patient does not wish to contact the pharmacy document the reason why and proceed with request.) (Agent: If yes, when and what did the pharmacy advise?)  This is the patient's preferred pharmacy:  Patient states it comes in UPS to his home address  [address] [place] [address]      Is this the correct pharmacy for this prescription? Yes If no, delete pharmacy and type the correct one.   Has the prescription been filled recently? Yes  Is the patient out of the medication? Yes  Has the patient been seen for an appointment in the last year OR does the patient have an upcoming appointment? Yes  Can we respond through MyChart? Yes  Agent: Please be advised that Rx refills may take up to 3 business days. We ask that you follow-up with your pharmacy.

## 2024-08-26 NOTE — Telephone Encounter (Signed)
 Find out where the scrip for Herlene is to be sent.

## 2024-08-26 NOTE — Telephone Encounter (Unsigned)
 Copied from CRM [id number]. Topic: Clinical - Order For Equipment >> Aug 26, 2024  4:18 PM Alfonso ORN wrote: Reason for CRM: Pt. need provider's  approval need an prescription for the [facility] , pt. get it thru the mail please contact patient for more information  when spouse called  did not know the name of the company that the monitor would be coming thru .  the call disconnect before getting more detail

## 2024-08-29 ENCOUNTER — Telehealth: Payer: Self-pay

## 2024-08-29 MED ORDER — FREESTYLE LIBRE 2 SENSOR MISC: Status: DC

## 2024-08-29 NOTE — Telephone Encounter (Signed)
 Needs to be ordered through [employer]?

## 2024-08-29 NOTE — Telephone Encounter (Signed)
 Copied from CRM [id number]. Topic: Clinical - Medication Refill >> Aug 26, 2024  4:26 PM Roselie C wrote: Medication:  Continuous Blood Gluc Sensor (FREESTYLE LIBRE 2 SENSOR) MISC patient needs the disk to be sent in   Has the patient contacted their pharmacy? Yes (Agent: If no, request that the patient contact the pharmacy for the refill. If patient does not wish to contact the pharmacy document the reason why and proceed with request.) (Agent: If yes, when and what did the pharmacy advise?)  This is the patient's preferred pharmacy:  Patient states it comes in [employer] to his home address  [address] [place] [address]      Is this the correct pharmacy for this prescription? Yes If no, delete pharmacy and type the correct one.   Has the prescription been filled recently? Yes  Is the patient out of the medication? Yes  Has the patient been seen for an appointment in the last year OR does the patient have an upcoming appointment? Yes  Can we respond through MyChart? Yes  Agent: Please be advised that Rx refills may take up to 3 business days. We ask that you follow-up with your pharmacy.

## 2024-08-29 NOTE — Addendum Note (Signed)
 Addended by: VIKTORIA ALAN MATSU on: 08/29/2024 09:52 AM   Modules accepted: Orders

## 2024-08-29 NOTE — Telephone Encounter (Signed)
 Sent to [facility]

## 2024-09-02 ENCOUNTER — Telehealth: Payer: Self-pay | Admitting: Pharmacist

## 2024-09-02 NOTE — Telephone Encounter (Signed)
 We cannot get herlene again due to coming off of insulin .

## 2024-09-02 NOTE — Telephone Encounter (Signed)
 Patient requesting CGM  Does not appear to be on insulin   Checking to see about insulin  vs hypoglycemia Will send to advanced diabetes supply company if he qualifies (via [employer] portal) Would switch to libre 3 Plus CGM at that time

## 2024-09-04 NOTE — Telephone Encounter (Signed)
 Left detailed message making patients spouse aware of this and advised for her to call back if she had questions.

## 2024-09-10 ENCOUNTER — Ambulatory Visit: Payer: Self-pay

## 2024-09-10 NOTE — Telephone Encounter (Signed)
 FYI Only or Action Required?: FYI only for provider.  Patient was last seen in primary care on 08/07/2024 by Zollie Lowers, MD.  Called Nurse Triage reporting Heartburn.  Symptoms began several days ago.  Interventions attempted: Prescription medications: as prescribed and Rest, hydration, or home remedies.  Symptoms are: unchanged.  Triage Disposition: Call PCP When Office is Open, See PCP Within 2 Weeks  Patient/caregiver understands and will follow disposition?: Yes   Copied from CRM [id number]. Topic: Clinical - Red Word Triage >> Sep 10, 2024  4:33 PM Jasmin G wrote: Red Word that prompted transfer to Nurse Triage: Pt's wife called due to pt experiencing weakness, tiredness, more reflux symptoms and burning in chest, she states that his PCP, Dr. Zollie doubled up on potassium pills recently. Reason for Disposition  [1] Morning (before breakfast) blood glucose < 80 mg/dL (4.4 mmol/L) AND [7] more than once in past week  [1] Abdominal pain is intermittent AND [2] shoots into chest, with sour taste in mouth  (Exception: Symptoms same as previously diagnosed reflux and not tried antacids.)  Answer Assessment - Initial Assessment Questions Additional info On metformin , fasting sugars have been lower than normal but not to point of needing treatment.   Today 111 553am, yesterday 153, 09/08/24 66, 09/06/24 62, 09/05/24 53 fasting. Current 111   1. LOCATION: Where does it hurt?      Center chest burning-his normal hearburn symptoms' 2. RADIATION: Does the pain shoot anywhere else? (e.g., chest, back)     no 3. ONSET: When did the pain begin? (e.g., minutes, hours or days ago)      intermittent 4. SUDDEN: Gradual or sudden onset?     Gradual  5. PATTERN Does the pain come and go, or is it constant?     intermittent 6. SEVERITY: How bad is the pain?  (e.g., Scale 1-10; mild, moderate, or severe)     None at this time 7. RECURRENT SYMPTOM: Have you ever had this type of  stomach pain before? If Yes, ask: When was the last time? and What happened that time?      Denies  8. AGGRAVATING FACTORS: Does anything seem to cause this pain? (e.g., foods, stress, alcohol)     Spicy food, tomatoes 9. CARDIAC SYMPTOMS: Do you have any of the following symptoms: chest pain, difficulty breathing, sweating, nausea?     Denies  10. OTHER SYMPTOMS: Do you have any other symptoms? (e.g., back pain, diarrhea, fever, urination pain, vomiting)       Fatigue  Answer Assessment - Initial Assessment Questions Additional info: Patients wife calling to schedule appointment for fatigue, increased reflux symptoms. She wonders if his fatigue is due to low potassium as he had this same issue early summer and needed potassium supplement which relieve his symptoms. He denies any chest pain or shortness of breath.    1. SYMPTOMS: What symptoms are you concerned about?     fatigue 2. ONSET:  When did the symptoms start?     Few days 3. BLOOD GLUCOSE: What is your blood glucose level?      111 4. USUAL RANGE: What is your blood glucose level usually? (e.g., usual fasting morning value, usual evening value)     Recent fasting 55-154 5. TYPE 1 or 2:  Do you know what type of diabetes you have?  (e.g., Type 1, Type 2, Gestational; doesn't know)      Non insulin  dependent 6. INSULIN : Do you take insulin ? What type of insulin (s)  do you use? What is the mode of delivery? (syringe, pen; injection or pump) When did you last give yourself an insulin  dose? (i.e., time or hours/minutes ago) How much did you give? (i.e., how many units)     no 7. DIABETES PILLS: Do you take any pills for your diabetes? If Yes, ask: What is the name of the medicine(s) that you take for high blood sugar?     metformin  8. OTHER SYMPTOMS: Do you have any symptoms? (e.g., fever, frequent urination, difficulty breathing, vomiting)     Heartburn increasing in frequency 9. LOW BLOOD GLUCOSE  TREATMENT: What have you done so far to treat the low blood glucose level?     Nothing needed 10. FOOD: When did you last eat or drink?        11. ALONE: Are you alone right now or is someone with you?         12. PREGNANCY: Is there any chance you are pregnant? When was your last menstrual period?  Protocols used: Abdominal Pain - Upper-A-AH, Diabetes - Low Blood Sugar-A-AH

## 2024-09-11 ENCOUNTER — Ambulatory Visit (INDEPENDENT_AMBULATORY_CARE_PROVIDER_SITE_OTHER): Admitting: Family Medicine

## 2024-09-11 ENCOUNTER — Encounter: Payer: Self-pay | Admitting: Family Medicine

## 2024-09-11 VITALS — BP 122/76 | HR 105 | Temp 97.9°F | Ht 70.0 in | Wt 258.2 lb

## 2024-09-11 DIAGNOSIS — Z794 Long term (current) use of insulin: Secondary | ICD-10-CM | POA: Diagnosis not present

## 2024-09-11 DIAGNOSIS — E1140 Type 2 diabetes mellitus with diabetic neuropathy, unspecified: Secondary | ICD-10-CM | POA: Diagnosis not present

## 2024-09-11 DIAGNOSIS — E876 Hypokalemia: Secondary | ICD-10-CM | POA: Diagnosis not present

## 2024-09-11 DIAGNOSIS — R5383 Other fatigue: Secondary | ICD-10-CM | POA: Diagnosis not present

## 2024-09-11 DIAGNOSIS — K2100 Gastro-esophageal reflux disease with esophagitis, without bleeding: Secondary | ICD-10-CM | POA: Diagnosis not present

## 2024-09-11 NOTE — Telephone Encounter (Signed)
 Appt made.

## 2024-09-11 NOTE — Progress Notes (Signed)
 Acute Office Visit  Subjective:     Patient ID: Juan Merritt, male    DOB: [birth year], 73 y.o.   MRN: [id number]  Chief Complaint  Patient presents with   Fatigue   Heartburn    HPI  History of Present Illness   ARMON ORVIS is a 73 year old male who presents with uncontrolled heartburn and fatigue.  Gastroesophageal symptoms - Significant heartburn with burning sensation occurring three times daily - Temporary relief of heartburn with ingestion of cold liquids - Protonix  previously provided some relief but isn't now, taken after meals rather than on an empty stomach - No abdominal pain - No dysphagia  Fatigue - Persistent fatigue, feeling constantly tired - Attributes some fatigue to recent immunotherapy treatments administered every 42 days - Fatigue increased after an increase in his potassium supplement.       ROS As per HPI.     Objective:    BP 122/76   Pulse (!) 105   Temp 97.9 F (36.6 C)   Ht 5' 10 (1.778 m)   Wt 258 lb 3.2 oz (117.1 kg)   SpO2 98%   BMI 37.05 kg/m    Physical Exam Vitals and nursing note reviewed.  Constitutional:      General: He is not in acute distress.    Appearance: He is obese. He is not ill-appearing, toxic-appearing or diaphoretic.  Cardiovascular:     Rate and Rhythm: Normal rate and regular rhythm.     Pulses: Normal pulses.     Heart sounds: Normal heart sounds. No murmur heard. Pulmonary:     Effort: Pulmonary effort is normal. No respiratory distress.     Breath sounds: Normal breath sounds.  Abdominal:     General: Bowel sounds are normal. There is no distension.     Palpations: Abdomen is soft. There is no mass.     Tenderness: There is no abdominal tenderness. There is no guarding or rebound.  Musculoskeletal:     Right lower leg: No edema.     Left lower leg: No edema.  Skin:    General: Skin is warm and dry.  Neurological:     General: No focal deficit present.     Mental Status: He is  alert and oriented to person, place, and time.  Psychiatric:        Mood and Affect: Mood normal.        Behavior: Behavior normal.     No results found for any visits on 09/11/24.      Assessment & Plan:   Trevyon was seen today for fatigue and heartburn.  Diagnoses and all orders for this visit:  Gastroesophageal reflux disease with esophagitis without hemorrhage  Other fatigue -     Anemia Profile B -     BMP8+EGFR -     TSH  Hypokalemia -     BMP8+EGFR      Fatigue Persistent fatigue likely related to cancer treatments - Order labs: blood counts, iron, potassium, electrolytes, thyroid  function.  Hypokalemia Low potassium, on supplements. Recent increase may need adjustment. - Repeat potassium level in lab work.  Gastroesophageal reflux disease with esophagitis Heartburn poorly controlled, exacerbated by diet and medication timing. - Instruct to take Protonix  on empty stomach in morning, wait 30 minutes before eating.      Return to office for new or worsening symptoms, or if symptoms persist.   The patient indicates understanding of these issues and agrees with the plan.  Annabella CHRISTELLA Search, FNP

## 2024-09-12 ENCOUNTER — Ambulatory Visit: Payer: Self-pay | Admitting: Family Medicine

## 2024-09-12 DIAGNOSIS — E538 Deficiency of other specified B group vitamins: Secondary | ICD-10-CM

## 2024-09-12 LAB — BMP8+EGFR
BUN/Creatinine Ratio: 13 (ref 10–24)
BUN: 14 mg/dL (ref 8–27)
CO2: 24 mmol/L (ref 20–29)
Calcium: 8.9 mg/dL (ref 8.6–10.2)
Chloride: 98 mmol/L (ref 96–106)
Creatinine, Ser: 1.09 mg/dL (ref 0.76–1.27)
Glucose: 106 mg/dL — ABNORMAL HIGH (ref 70–99)
Potassium: 3.5 mmol/L (ref 3.5–5.2)
Sodium: 137 mmol/L (ref 134–144)
eGFR: 73 mL/min/1.73 (ref >59)

## 2024-09-12 LAB — ANEMIA PROFILE B
Basophils Absolute: 0 x10E3/uL (ref 0.0–0.2)
Basos: 0 %
EOS (ABSOLUTE): 0.1 x10E3/uL (ref 0.0–0.4)
Eos: 1 %
Ferritin: 273 ng/mL (ref 30–400)
Folate: 8.8 ng/mL (ref >3.0)
Hematocrit: 36.9 % — ABNORMAL LOW (ref 37.5–51.0)
Hemoglobin: 12.4 g/dL — ABNORMAL LOW (ref 13.0–17.7)
Immature Grans (Abs): 0 x10E3/uL (ref 0.0–0.1)
Immature Granulocytes: 0 %
Iron Saturation: 16 % (ref 15–55)
Iron: 50 ug/dL (ref 38–169)
Lymphocytes Absolute: 0.7 x10E3/uL (ref 0.7–3.1)
Lymphs: 14 %
MCH: 35 pg — ABNORMAL HIGH (ref 26.6–33.0)
MCHC: 33.6 g/dL (ref 31.5–35.7)
MCV: 104 fL — ABNORMAL HIGH (ref 79–97)
Monocytes Absolute: 0.6 x10E3/uL (ref 0.1–0.9)
Monocytes: 11 %
Neutrophils Absolute: 4 x10E3/uL (ref 1.4–7.0)
Neutrophils: 74 %
Platelets: 213 x10E3/uL (ref 150–450)
RBC: 3.54 x10E6/uL — ABNORMAL LOW (ref 4.14–5.80)
RDW: 12.8 % (ref 11.6–15.4)
Retic Ct Pct: 2 % (ref 0.6–2.6)
Total Iron Binding Capacity: 313 ug/dL (ref 250–450)
UIBC: 263 ug/dL (ref 111–343)
Vitamin B-12: 203 pg/mL — AB (ref 232–1245)
WBC: 5.5 x10E3/uL (ref 3.4–10.8)

## 2024-09-12 LAB — TSH: TSH: 4.34 u[IU]/mL (ref 0.450–4.500)

## 2024-09-12 MED ORDER — VITAMIN B-12 1000 MCG PO TABS: 1000.0000 ug | ORAL_TABLET | Freq: Every day | ORAL | 0 refills | Status: DC

## 2024-09-13 ENCOUNTER — Telehealth: Payer: Self-pay | Admitting: Family Medicine

## 2024-09-13 ENCOUNTER — Telehealth: Payer: Self-pay

## 2024-09-13 NOTE — Telephone Encounter (Signed)
 Copied from CRM #[id number]. Topic: Clinical - Lab/Test Results >> Sep 13, 2024 10:24 AM Roselie BROCKS wrote: Reason for CRM: Patient requests a call back. Patient needs to know why the potassium wasn't checked, and also states he was told to stay on prescribed doze, but at another clinic two weeks ago, he was told to double the potassium, and patient needs clarification

## 2024-09-13 NOTE — Telephone Encounter (Signed)
 Copied from CRM #[id number]. Topic: General - Other >> Sep 13, 2024  4:47 PM Geneva B wrote: Reason for CRM: patient wife norma is calling in has questions medical question about pt potassium please call pt [phone] (M) [phone] (H)

## 2024-09-17 ENCOUNTER — Encounter: Payer: Self-pay | Admitting: Nurse Practitioner

## 2024-09-17 ENCOUNTER — Ambulatory Visit: Payer: Self-pay

## 2024-09-17 ENCOUNTER — Ambulatory Visit (INDEPENDENT_AMBULATORY_CARE_PROVIDER_SITE_OTHER): Admitting: Nurse Practitioner

## 2024-09-17 VITALS — BP 108/73 | HR 105 | Temp 98.2°F | Ht 70.0 in | Wt 252.0 lb

## 2024-09-17 DIAGNOSIS — F17210 Nicotine dependence, cigarettes, uncomplicated: Secondary | ICD-10-CM

## 2024-09-17 DIAGNOSIS — R0602 Shortness of breath: Secondary | ICD-10-CM | POA: Diagnosis not present

## 2024-09-17 DIAGNOSIS — Z716 Tobacco abuse counseling: Secondary | ICD-10-CM

## 2024-09-17 DIAGNOSIS — F17200 Nicotine dependence, unspecified, uncomplicated: Secondary | ICD-10-CM | POA: Insufficient documentation

## 2024-09-17 DIAGNOSIS — K2100 Gastro-esophageal reflux disease with esophagitis, without bleeding: Secondary | ICD-10-CM | POA: Insufficient documentation

## 2024-09-17 DIAGNOSIS — J441 Chronic obstructive pulmonary disease with (acute) exacerbation: Secondary | ICD-10-CM | POA: Diagnosis not present

## 2024-09-17 MED ORDER — AZITHROMYCIN 250 MG PO TABS: ORAL_TABLET | ORAL | 0 refills | Status: AC

## 2024-09-17 MED ORDER — IPRATROPIUM-ALBUTEROL 0.5-2.5 (3) MG/3ML IN SOLN: 3.0000 mL | Freq: Four times a day (QID) | RESPIRATORY_TRACT | 0 refills | Status: DC | PRN

## 2024-09-17 MED ORDER — PREDNISONE 10 MG (21) PO TBPK: ORAL_TABLET | ORAL | 0 refills | Status: DC

## 2024-09-17 NOTE — Telephone Encounter (Signed)
 Patient being seen today to discuss with provider

## 2024-09-17 NOTE — Telephone Encounter (Signed)
 FYI Only or Action Required?: FYI only for provider.  Patient was last seen in primary care on 09/11/2024 by Joesph Annabella HERO, FNP.  Called Nurse Triage reporting Shortness of Breath.  Symptoms began several days ago.  Interventions attempted: Prescription medications: symbicort , albuterol  rescue inhaler, nebulizer treatments, tylenol .  Symptoms are: unchanged.  Triage Disposition: See HCP Within 4 Hours (Or PCP Triage)  Patient/caregiver understands and will follow disposition?: Yes  Pt's wife is RN and requesting appt with PCP today. Due to PCP having no appts, scheduled with another provider at the office. Pt is not currently in any distress, requesting appt to assist with xray and other testing.    Copied from CRM #[id number]. Topic: Clinical - Red Word Triage >> Sep 17, 2024 10:03 AM Kevelyn M wrote: Red Word that prompted transfer to Nurse Triage: Started Medication 4 weeks ago, medication isn't working, it seems like it's getting worse. His chest is still burning and hurting, trouble breathing. Wife of the patient is Requesting a chest x-ray and ekg without going to the ER. 145/85 BP.   Reason for Disposition  [1] Longstanding difficulty breathing AND [2] not responding to usual therapy  Answer Assessment - Initial Assessment Questions Pt's wife is calling for patient who is experiencing shortness of breath. Pt is not currently in distress. Pt's wife is an Charity fundraiser is requesting an appt today to assist with chest xray and possible EKG. She states that he has been using his nebulizer more often. Used it this am and was 89% and then 91% after treatment. 121-123 after treatment. temp 100.6 this am, now afebrile but was given tylenol , BP 145/85 135/76. She tested him for covid which was negative.    1. RESPIRATORY STATUS: Describe your breathing? (e.g., wheezing, shortness of breath, unable to speak, severe coughing)      No wheezing, just shortness of breath, doesn't seem to moving much  air 2. ONSET: When did this breathing problem begin?      Been worse this week 3. PATTERN Does the difficult breathing come and go, or has it been constant since it started?      intermittent 4. SEVERITY: How bad is your breathing? (e.g., mild, moderate, severe)      mild 5. RECURRENT SYMPTOM: Have you had difficulty breathing before? If Yes, ask: When was the last time? and What happened that time?      yes 6. CARDIAC HISTORY: Do you have any history of heart disease? (e.g., heart attack, angina, bypass surgery, angioplasty)      No increased fluids on legs 7. LUNG HISTORY: Do you have any history of lung disease?  (e.g., pulmonary embolus, asthma, emphysema)     Lung cancer 8. CAUSE: What do you think is causing the breathing problem?      Unsure but he's not getting better but not getting worse 9. OTHER SYMPTOMS: Do you have any other symptoms? (e.g., chest pain, cough, dizziness, fever, runny nose)    Dry smokers cough Low grade fever this am.  10. O2 SATURATION MONITOR:  Do you use an oxygen  saturation monitor (pulse oximeter) at home? If Yes, ask: What is your reading (oxygen  level) today? What is your usual oxygen  saturation reading? (e.g., 95%)       89 prior to treatment, 91% after treatment.  Protocols used: Breathing Difficulty-A-AH

## 2024-09-17 NOTE — Progress Notes (Signed)
 Subjective:  Patient ID: Juan Merritt, male    DOB: 14-Dec-1951, 73 y.o.   MRN: [id number]  Patient Care Team: Zollie Lowers, MD as PCP - General (Family Medicine) Billee Mliss JONETTA, [facility] as Pharmacist (Family Medicine) Vicci Mcardle, [place] (Optometry) Darlean Ozell NOVAK, MD as Consulting Physician (Pulmonary Disease)   Chief Complaint:  Shortness of Breath (Symptoms for couple weeks) and Chest Pain (burning)   HPI: Juan Merritt is a 73 y.o. male presenting on 09/17/2024 for Shortness of Breath (Symptoms for couple weeks) and Chest Pain (burning)   Discussed the use of AI scribe software for clinical note transcription with the patient, who gave verbal consent to proceed.  History of Present Illness Juan Merritt is a 73 year old male with lung cancer and COPD who presents with burning chest pain and shortness of breath. He is accompanied by his partner. He was seen 09/11/2024 by Joesph FNP had labs done TSH normal,  anemia panel yield low B-12 , low H&H, BMP was unremarkable beside BG 106. He was prescribed B-12  He experiences a severe burning sensation in his chest, described as 'like fire', which varies in location from the middle to across the chest. This burning occurs all day and is exacerbated when lying down, making it difficult for him to sleep. He has been taking pantoprazole  40 mg once daily in the morning, but reports no significant relief from this medication. He typically eats his last meal around 6-7 PM and goes to bed around 10-12 PM. No abdominal pain; No dysphagia  He completed radiation therapy for lung cancer on April 15, 2024, and has been receiving immunotherapy every 42 days. He is due for his next treatment next week. He had a CT scan on March 31, 2024, which showed a small clot in the carotid artery, and he has been on Eliquis 5 mg since then.  He has a history of COPD and reports shortness of breath, which has not significantly worsened. He used a nebulizer  for the first time in a year yesterday and again this morning due to shortness of breath; oxygen  saturation prior to the DuoNeb treatment 89% went up to 92% posttreatment. He denies increased use of his inhaler. He has been on Symbicort  due to the high cost of Breztri .  He smokes about a pack a day and has attempted to cut back using nicotine  patches. He drinks alcohol about two to three times a week, and has a fondness for chocolate, which he consumes in large quantities occasionally.    Relevant past medical, surgical, family, and social history reviewed and updated as indicated.  Allergies and medications reviewed and updated. Data reviewed: Chart in Epic.   Past Medical History:  Diagnosis Date   Arthritis    BPH associated with nocturia    Cancer (HCC)    stage III non-small cell lung cancer.   Chronic gout    COPD (chronic obstructive pulmonary disease) (HCC)    not on home o2   History of colon polyps    History of COVID-19    04/ 2019 hospital admission in epic, severe sepsis due to covid w/ acute respiratory failure;   and 05/ 2021 mild to moderate symptoms, recovered at home   Hyperlipidemia, mixed    Hypertension    Phimosis    Type 2 diabetes mellitus (HCC)    followed by pcp   (05-17-2022  pt stated checks sugar multiple times daily w/ Herlene,  fasting average --- 100-110)    Past Surgical History:  Procedure Laterality Date   BRONCHIAL BIOPSY  01/22/2024   Procedure: BRONCHIAL BIOPSIES;  Surgeon: Shelah Lamar RAMAN, MD;  Location: [facility] ENDOSCOPY;  Service: Pulmonary;;   BRONCHIAL BRUSHINGS  01/22/2024   Procedure: BRONCHIAL BRUSHINGS;  Surgeon: Shelah Lamar RAMAN, MD;  Location: [facility] ENDOSCOPY;  Service: Pulmonary;;   BRONCHIAL NEEDLE ASPIRATION BIOPSY  01/22/2024   Procedure: BRONCHIAL NEEDLE ASPIRATION BIOPSIES;  Surgeon: Shelah Lamar RAMAN, MD;  Location: [facility] ENDOSCOPY;  Service: Pulmonary;;   CIRCUMCISION N/A 05/19/2022   Procedure: CIRCUMCISION ADULT;  Surgeon: Matilda Senior, MD;  Location: [facility];  Service: Urology;  Laterality: N/A;  45 MINUTES   COLONOSCOPY  12/01/2021   by dr legrand   ENDOBRONCHIAL ULTRASOUND  01/22/2024   Procedure: ENDOBRONCHIAL ULTRASOUND;  Surgeon: Shelah Lamar RAMAN, MD;  Location[facility] ENDOSCOPY;  Service: Pulmonary;;   FINE NEEDLE ASPIRATION  01/22/2024   Procedure: FINE NEEDLE ASPIRATION (FNA) LINEAR;  Surgeon: Shelah Lamar RAMAN, MD;  Location: [facility] ENDOSCOPY;  Service: Pulmonary;;   LAPAROSCOPIC CHOLECYSTECTOMY  2008   in [place]   LEFT HEART CATH     TONSILLECTOMY     child   VIDEO BRONCHOSCOPY WITH RADIAL ENDOBRONCHIAL ULTRASOUND  01/22/2024   Procedure: VIDEO BRONCHOSCOPY WITH RADIAL ENDOBRONCHIAL ULTRASOUND;  Surgeon: Shelah Lamar RAMAN, MD;  Location: [facility] ENDOSCOPY;  Service: Pulmonary;;    Social History   Socioeconomic History   Marital status: Married    Spouse name: Madeline   Number of children: 5   Years of education: 10   Highest education level: 10th grade  Occupational History   Occupation: retired Naval architect  Tobacco Use   Smoking status: Every Day    Current packs/day: 0.00    Average packs/day: 0.3 packs/day for 40.0 years (10.0 ttl pk-yrs)    Types: Cigarettes    Start date: 01/15/1977    Last attempt to quit: 01/15/2017    Years since quitting: 7.6   Smokeless tobacco: Current    Types: Chew   Tobacco comments:    Smokes half to one pack per day for about 45 years - now also smokes e-cigarette sometimes and chews tobacco.  Updated 12/01/2023.  Vaping Use   Vaping status: Former   Substances: Nicotine    Devices: unsure  Substance and Sexual Activity   Alcohol use: Yes    Alcohol/week: 24.0 standard drinks of alcohol    Types: 24 Cans of beer per week   Drug use: Never   Sexual activity: Not Currently  Other Topics Concern   Not on file  Social History Narrative   Lives at home with wife.  Grown children.  Retired IT trainer. Children live nearby.    Social Drivers of Manufacturing engineer Strain: Low Risk  (08/22/2024)   Overall Financial Resource Strain (CARDIA)    Difficulty of Paying Living Expenses: Not hard at all  Food Insecurity: No Food Insecurity (08/22/2024)   Hunger Vital Sign    Worried About Running Out of Food in the Last Year: Never true    Ran Out of Food in the Last Year: Never true  Transportation Needs: No Transportation Needs (08/22/2024)   PRAPARE - Administrator, Civil Service (Medical): No    Lack of Transportation (Non-Medical): No  Physical Activity: Inactive (08/22/2024)   Exercise Vital Sign    Days of Exercise per Week: 0 days    Minutes of Exercise per Session: 0  min  Stress: No Stress Concern Present (08/22/2024)   [employer] of Occupational Health - Occupational Stress Questionnaire    Feeling of Stress: Not at all  Social Connections: Moderately Isolated (02/23/2024)   Received from [facility]   Social Connection and Isolation Panel    In a typical week, how many times do you talk on the phone with family, friends, or neighbors?: More than three times a week    How often do you get together with friends or relatives?: Once a week    How often do you attend church or religious services?: Never    Do you belong to any clubs or organizations such as church groups, unions, fraternal or athletic groups, or school groups?: No    How often do you attend meetings of the clubs or organizations you belong to?: Never    Are you married, widowed, divorced, separated, never married, or living with a partner?: Married  Intimate Partner Violence: Not At Risk (08/22/2024)   Humiliation, Afraid, Rape, and Kick questionnaire    Fear of Current or Ex-Partner: No    Emotionally Abused: No    Physically Abused: No    Sexually Abused: No    Outpatient Encounter Medications as of 09/17/2024  Medication Sig   acetaminophen  (TYLENOL ) 500 MG tablet Take 1,000 mg by mouth 2 (two) times daily as needed for moderate pain (pain  score 4-6), fever or headache.   albuterol  (VENTOLIN  HFA) 108 (90 Base) MCG/ACT inhaler INHALE 2 PUFFS INTO THE LUNGS TWICE A DAY   allopurinol  (ZYLOPRIM ) 100 MG tablet TAKE 1 TABLET (100 MG TOTAL) BY MOUTH DAILY. TO PREVENT GOUT   Apixaban Starter Pack, 10mg  and 5mg , (ELIQUIS DVT/PE STARTER PACK) Take 10 mg by mouth.   azithromycin  (ZITHROMAX ) 250 MG tablet Take 2 tablets on day 1, then 1 tablet daily on days 2 through 5   budesonide -formoterol  (SYMBICORT ) 160-4.5 MCG/ACT inhaler 2 puffs first thing in am and then another 2 puffs about 12 hours later.   Continuous Glucose Sensor (FREESTYLE LIBRE 2 SENSOR) MISC USE TO TEST BLOOD SUGAR CONTINUOUSLY. Dx. E11.65 SEND TO [facility] FOR MEDICARE COVERAGE, DO NOT SEND TO LOCAL PHARMACY   cyanocobalamin  (VITAMIN B12) 1000 MCG tablet Take 1 tablet (1,000 mcg total) by mouth daily.   folic acid  (FOLATE) 400 MCG tablet Take 400 mcg by mouth daily.   ipratropium-albuterol  (DUONEB) 0.5-2.5 (3) MG/3ML SOLN Take 3 mLs by nebulization every 6 (six) hours as needed.   metFORMIN  (GLUCOPHAGE -XR) 750 MG 24 hr tablet Take 1 tablet (750 mg total) by mouth daily with breakfast.   pantoprazole  (PROTONIX ) 40 MG tablet Take 1 tablet (40 mg total) by mouth daily. For stomach   potassium chloride  SA (KLOR-CON  M20) 20 MEQ tablet TAKE 1 TABLET (20 MEQ TOTAL) DAILY BY MOUTH.   predniSONE  (STERAPRED UNI-PAK 21 TAB) 10 MG (21) TBPK tablet Use as directed on back of pill pack   tamsulosin  (FLOMAX ) 0.4 MG CAPS capsule TAKE 2 CAPSULES BY MOUTH AT BEDTIME FOR URINE FLOW AND PROSTATE   [DISCONTINUED] ipratropium-albuterol  (DUONEB) 0.5-2.5 (3) MG/3ML SOLN Take 3 mLs by nebulization every 4 (four) hours as needed (SOB).   No facility-administered encounter medications on file as of 09/17/2024.    No Known Allergies  Pertinent ROS per HPI, otherwise unremarkable      Objective:  BP 108/73   Pulse (!) 105   Temp 98.2 F (36.8 C) (Temporal)   Ht 5' 10 (1.778 m)   Wt  252  lb (114.3 kg)   SpO2 90%   BMI 36.16 kg/m    Wt Readings from Last 3 Encounters:  09/17/24 252 lb (114.3 kg)  09/11/24 258 lb 3.2 oz (117.1 kg)  08/22/24 252 lb (114.3 kg)    Physical Exam Vitals and nursing note reviewed.  Constitutional:      Appearance: He is obese.  HENT:     Head: Normocephalic and atraumatic.     Nose: Nose normal.     Mouth/Throat:     Mouth: Mucous membranes are moist.  Eyes:     General: No scleral icterus.    Extraocular Movements: Extraocular movements intact.     Conjunctiva/sclera: Conjunctivae normal.     Pupils: Pupils are equal, round, and reactive to light.  Cardiovascular:     Heart sounds: Normal heart sounds.  Pulmonary:     Effort: Pulmonary effort is normal.     Breath sounds: Wheezing present.  Abdominal:     General: Bowel sounds are normal.     Palpations: Abdomen is soft.  Musculoskeletal:        General: Normal range of motion.     Right lower leg: No edema.     Left lower leg: No edema.  Skin:    General: Skin is warm and dry.     Findings: No rash.  Neurological:     Mental Status: He is alert and oriented to person, place, and time.  Psychiatric:        Mood and Affect: Mood normal.        Thought Content: Thought content normal.        Judgment: Judgment normal.    Physical Exam CHEST: Wheezing heard in lungs.     Results for orders placed or performed in visit on 09/11/24  Anemia Profile B   Collection Time: 09/11/24  8:21 AM  Result Value Ref Range   Total Iron Binding Capacity 313 250 - 450 ug/dL   UIBC 736 888 - 656 ug/dL   Iron 50 38 - 830 ug/dL   Iron Saturation 16 15 - 55 %   Ferritin 273 30 - 400 ng/mL   Vitamin B-12 203 (L) 232 - 1,245 pg/mL   Folate 8.8 >3.0 ng/mL   WBC 5.5 3.4 - 10.8 x10E3/uL   RBC 3.54 (L) 4.14 - 5.80 x10E6/uL   Hemoglobin 12.4 (L) 13.0 - 17.7 g/dL   Hematocrit 63.0 (L) 62.4 - 51.0 %   MCV 104 (H) 79 - 97 fL   MCH 35.0 (H) 26.6 - 33.0 pg   MCHC 33.6 31.5 - 35.7 g/dL    RDW 87.1 88.3 - 84.5 %   Platelets 213 150 - 450 x10E3/uL   Neutrophils 74 Not Estab. %   Lymphs 14 Not Estab. %   Monocytes 11 Not Estab. %   Eos 1 Not Estab. %   Basos 0 Not Estab. %   Neutrophils Absolute 4.0 1.4 - 7.0 x10E3/uL   Lymphocytes Absolute 0.7 0.7 - 3.1 x10E3/uL   Monocytes Absolute 0.6 0.1 - 0.9 x10E3/uL   EOS (ABSOLUTE) 0.1 0.0 - 0.4 x10E3/uL   Basophils Absolute 0.0 0.0 - 0.2 x10E3/uL   Immature Granulocytes 0 Not Estab. %   Immature Grans (Abs) 0.0 0.0 - 0.1 x10E3/uL   Retic Ct Pct 2.0 0.6 - 2.6 %  BMP8+EGFR   Collection Time: 09/11/24  8:21 AM  Result Value Ref Range   Glucose 106 (H) 70 - 99 mg/dL   BUN  14 8 - 27 mg/dL   Creatinine, Ser 8.90 0.76 - 1.27 mg/dL   eGFR 73 >40 fO/fpw/8.26   BUN/Creatinine Ratio 13 10 - 24   Sodium 137 134 - 144 mmol/L   Potassium 3.5 3.5 - 5.2 mmol/L   Chloride 98 96 - 106 mmol/L   CO2 24 20 - 29 mmol/L   Calcium  8.9 8.6 - 10.2 mg/dL  TSH   Collection Time: 09/11/24  8:21 AM  Result Value Ref Range   TSH 4.340 0.450 - 4.500 uIU/mL       Pertinent labs & imaging results that were available during my care of the patient were reviewed by me and considered in my medical decision making.  Assessment & Plan:  Juan Merritt was seen today for shortness of breath and chest pain.  Diagnoses and all orders for this visit:  SOB (shortness of breath) -     EKG 12-Lead  COPD with acute exacerbation (HCC) -     predniSONE  (STERAPRED UNI-PAK 21 TAB) 10 MG (21) TBPK tablet; Use as directed on back of pill pack -     azithromycin  (ZITHROMAX ) 250 MG tablet; Take 2 tablets on day 1, then 1 tablet daily on days 2 through 5  Gastroesophageal reflux disease with esophagitis without hemorrhage  Other orders -     ipratropium-albuterol  (DUONEB) 0.5-2.5 (3) MG/3ML SOLN; Take 3 mLs by nebulization every 6 (six) hours as needed.     Assessment and Plan Assessment & Plan Chronic obstructive pulmonary disease with acute exacerbation COPD  exacerbation likely due to smoking. Negative COVID test. - Administer prednisone  taper pack (21 pills). - Prescribe Zithromax  (Z-Pak): 2 tablets on day 1, then 1 tablet daily until finished. - Refill nebulizer medication for home use. - Continue Symbicort  inhaler. - Advise to reduce smoking to improve symptoms.  Tobacco Dependence Continues to smoke approximately one pack per day. Smoking contributes to COPD exacerbation and GERD symptoms. - Advise to reduce smoking to improve respiratory and GERD symptoms. Smoking Cessation Counseling 4-mins: -Discussed smoking history and assessed readiness to quit. -Briefly educated patient on health risks of continued smoking, including increased risk of cardiovascular disease, respiratory conditions, and cancer. -Explored motivation to quit and identified potential barriers  stress, habit, social influences. -Provided brief counseling on behavioral strategies: -Avoid triggers alcohol, certain social settings. Offered support resources: 1-800-QUIT-NOW or smokefree.gov Mobile apps such as QuitGuide or QuitStart Discussed pharmacotherapy options: Patient expressed  no interest in quitting at this time.   Gastroesophageal reflux disease Persistent burning sensation likely due to GERD. Symptoms not relieved by current pantoprazole  regimen. - Continue pantoprazole  40 mg daily, adjust timing to one hour before or after meals. - Advise dietary modifications: reduce chocolate, alcohol, spicy foods, and citrus intake.  Lung cancer, status post radiation and on immunotherapy - Continue immunotherapy as scheduled.  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 Recent imaging shows slight reduction in clot size with no movement. - Continue Eliquis 5 mg as prescribed.  Aortic stenosis Known aortic stenosis. No acute changes on recent EKG. - Perform EKG to rule out cardiac changes.      Continue all other maintenance  medications.  Follow up plan: Return if symptoms worsen or fail to improve.   Continue healthy lifestyle choices, including diet (rich in fruits, vegetables, and lean proteins, and low in salt and simple carbohydrates) and exercise (at least 30 minutes of moderate physical activity daily).  Educational handout given for    Clinical References  Chronic Obstructive Pulmonary Disease Exacerbation  Chronic obstructive pulmonary disease (COPD) is a long-term (chronic) lung problem. When you have COPD, it can feel harder to breathe in or out. COPD exacerbation is a flare-up of symptoms when breathing gets worse and more treatment may be needed. Without treatment, flare-ups can be life-threatening. If they happen often, your lungs can become more damaged. What are the causes? Not taking your usual COPD medicines as told by your health care provider. A cold or the flu, which can cause infection in your lungs. Being exposed to things that make your breathing worse, such as: Smoke. Air pollution. Fumes. Dust. Allergies. Weather changes. What are the signs or symptoms? Symptoms do not get better or get worse even if you take your medicines as told by your provider. Symptoms may include: More shortness of breath. You may only be able to speak one or two words at a time. More coughing or mucus from your lungs. More wheezing or chest tightness. Being more tired and having less energy. Confusion. How is this diagnosed? This condition is diagnosed based on: Symptoms that get worse. Your medical history. A physical exam. You may also have tests, including: A chest X-ray. Blood or mucus tests. How is this treated? You may be able to stay home or you may need to go to the hospital. Treatment may include: Taking medicines. These may include: Inhalers. These have medicines in them that you breathe in. These may be more of what you already take or they may be new. Steroids. These reduce  inflammation in the airways. These may be inhaled, taken by mouth, or given in an IV. Antibiotics. These treat infection. Using oxygen . Using a device to help you clear mucus. Follow these instructions at home: Medicines Take your medicines only as told by your provider. If you were given antibiotics or steroids, take them as told by your provider. Do not stop taking them even if you start to feel better. Lifestyle Several times a day, wash your hands with soap and water for at least 20 seconds. If you cannot use soap and water, use hand sanitizer. This may help keep you from getting an infection. Avoid being around crowds or people who are sick. Do not smoke or use any products that contain nicotine  or tobacco. If you need help quitting, ask your provider. Return to your normal activities when your provider says that it's safe. Use breathing methods to control your stress and catch your breath. How is this prevented? Follow your COPD action plan. The action plan tells you what to do if you're feeling good and what to do when you start feeling worse. Discuss the plan often with your provider. Make sure you get all the shots, also called vaccines, that your provider recommends. Ask your provider about a flu shot and a pneumonia shot. Use oxygen  therapy if told by your provider. If you need home oxygen  therapy, ask your provider how often to check your oxygen  level with a device called an oximeter. Keep all follow-up visits to review your COPD action plan. Your provider will want to check on your condition often to keep you healthy and out of the hospital. Contact a health care provider if: Your COPD symptoms get worse. You have a fever or chills. You have trouble doing daily activities. You have trouble breathing even when you are resting. Get help right away if: You are short of breath and cannot: Talk in full sentences. Do normal activities. You have chest  pain. You feel  confused. These symptoms may be an emergency. Call 911 right away. Do not wait to see if the symptoms will go away. Do not drive yourself to the hospital. This information is not intended to replace advice given to you by your health care provider. Make sure you discuss any questions you have with your health care provider. Document Revised: 09/07/2023 Document Reviewed: 02/20/2023 Elsevier Patient Education  2024 Elsevier Inc. How to Use a Nebulizer, Adult  A nebulizer is a device that turns liquid medicine into a mist or vapor that you can breathe in (inhale). This medicine helps to open the air passages in your lungs. You may need to use a nebulizer if you have an acute breathing illness, such as pneumonia. A nebulizer may also be used to treat chronic conditions, such as asthma or chronic obstructive pulmonarydisease (COPD). There are different kinds of nebulizers. With some nebulizers, you breathe in medicine through a mouthpiece. With others, you get medicine through a mask that fits over your nose and mouth. What are the risks? If you use a nebulizer that does not fit right or is not cleaned properly, it can cause some problems, including: Infection. Eye irritation. Delivery of too much medicine or not enough medicine. Mouth irritation. Supplies needed: Air compressor (nebulizer machine). Nebulizer medicine cup (reservoir)and tubing. Mouthpiece or face mask. Soap and water. Sterile or distilled water. Clean towel. How to use a nebulizer     Preparing a nebulizer Take these steps before using your nebulizer: Read the manufacturer's instructions for your nebulizer, as machines vary. Check your medicine. Make sure it has not expired and is not damaged in any way. Wash your hands with soap and water. Put all of the parts of your nebulizer on a sturdy, flat surface. Connect the tubing to the nebulizer machine and to the reservoir. Measure the liquid medicine according to  instructions from your health care provider. Pour the liquid into the reservoir. Attach the mouthpiece or mask. Test the nebulizer by turning it on to make sure that a spray comes out. Then, turn it off. Using a nebulizer Be sure to stop the machine at any time if you start coughing or if the medicine foams or bubbles. Sit in an upright, relaxed position. If your nebulizer has a mask, put it over your nose and mouth. It should fit somewhat snugly, with no gaps around the nose or cheeks where medicine could escape. If you use a mouthpiece, put it in your mouth. Press your lips firmly around the mouthpiece. Turn on the nebulizer. Some nebulizers have a finger valve. If yours does, cover up the air hole so the air gets to the nebulizer. Once the medicine begins to mist out, take slow, deep breaths. If there is a finger valve, release it at the end of your breath. Continue taking slow, deep breaths until the medicine in the nebulizer is gone and no mist appears. Cleaning a nebulizer The nebulizer and all of its parts must be kept very clean. If the nebulizer and its parts are not cleaned properly, bacteria can grow inside of them. If you inhale the bacteria, you can get sick. Follow the manufacturer's instructions for cleaning your nebulizer. For most nebulizers, you should follow these guidelines: Clean the mouthpiece or mask and the reservoir by: Rinsing them after each use. Use sterile or distilled water. Washing them 1-2 times a week using soap and warm water. Do not wash the tubing. After you rinse or wash  them, place the parts on a clean towel and let them air-dry completely. After they dry, reconnect the pieces and turn the nebulizer on without any medicine in it. Doing this will blow air through the equipment to help dry it out. Store the nebulizer in a clean and dust-free place. Check the filter at least one time every week. Replace the filter if it looks dirty. Follow these instructions at  home Use your nebulizer only as told by your health care provider. Do not use the nebulizer more than directed by your health care provider. Do not use any products that contain nicotine  or tobacco, such as cigarettes, e-cigarettes, and chewing tobacco. If you need help quitting, ask your health care provider. Keep all follow-up visits as told by your health care provider. This is important. Where to find more information Allergy & Ast[employer] Network: allergyasthmanetwork.org American Lung Association: www.lung.org Contact a health care provider if: You have trouble using the nebulizer. Your nebulizer foams or stops working. Your nebulizer does not create a mist after you add medicine and turn it on. Get help right away if: You continue to have trouble breathing. Your breathing gets worse during a nebulizer treatment. These symptoms may represent a serious problem that is an emergency. Do not wait to see if the symptoms will go away. Get medical help right away. Call your local emergency services (911 in the [place] Do not drive yourself to the hospital. Summary A nebulizer is a device that turns liquid medicine into a mist (vapor) that you can breathe in (inhale). Measure the liquid medicine according to instructions from your health care provider. Pour the liquid into the part of the nebulizer that holds the medicine (reservoir). Once the medicine begins to mist out, take slow, deep breaths. Rinse or wash the mouthpiece or mask and the reservoir after each use, and allow them to air-dry completely. This information is not intended to replace advice given to you by your health care provider. Make sure you discuss any questions you have with your health care provider. Document Revised: 11/07/2022 Document Reviewed: 11/07/2022 Elsevier Patient Education  2024 Elsevier Inc. GERD in Adults: Diet Changes When you have gastroesophageal reflux disease (GERD), you may need to make changes to your diet.  Choosing the right foods can help with your symptoms. Think about working with an expert in healthy eating called a dietitian. They can help you make healthy food choices. What are tips for following this plan? Reading food labels Look for foods that are low in saturated fat. Foods that may help with your symptoms include: Foods with less than 5% of daily value (DV) of fat. Foods with 0 grams of trans fat. Cooking [employer] in ways that don't use a lot of fat. These ways include: Baking. Steaming. Grilling. Broiling. To add flavor, try to use herbs that are low in spice and acidity. Avoid frying your food. Meal planning  Eat small meals often rather than eating 3 large meals each day. Eat your meals slowly in a place where you feel relaxed. If told by your health care provider, avoid: Foods that cause symptoms. Keep a food diary to keep track of foods that cause symptoms. Alcohol. Drinking a lot of liquid with meals. General instructions For 2-3 hours after you eat, avoid: Bending over. Exercise. Lying down. Chew sugar-free gum after meals. What foods should I eat? Eat a healthy diet. Try to include: Foods with high amounts of fiber. These include: Fruits and vegetables. Whole  grains and beans. Low-fat dairy products. Lean meats, fish, and poultry. Egg whites. Foods that cause symptoms in someone else may not cause symptoms for you. Work with your provider to find foods that are safe for you. The items listed above may not be all the foods and drinks you can have. Talk with a dietitian to learn more. The items listed above may not be a complete list of foods and beverages you can eat and drink. Contact a dietitian for more information. What foods should I avoid? Limiting some of these foods may help with your symptoms. Each person is different. Talk with a dietitian or your provider to help you find the exact foods to avoid. Some of the foods to avoid may  include: Fruits Fruits with a lot of acid in them. These may include citrus fruits, such as oranges, grapefruit, pineapple, and lemons. Vegetables Deep-fried vegetables, such as [place] fries. Vegetables, sauces, or toppings made with added fat and vegetables with acid in them. These may include tomatoes and tomato products, chili peppers, onions, garlic, and horseradish. Grains Pastries or quick breads with added fat. Meats and other proteins High-fat meats, such as fatty beef or pork, hot dogs, ribs, ham, sausage, salami, and bacon. Fried meat or protein, such as fried fish and fried chicken. Egg yolks. Fats and oils Butter. Margarine. Shortening. Ghee. Drinks Coffee and other drinks with caffeine in them. Fizzy and sugary drinks, such as soda and energy drinks. Fruit juice made with acidic fruits, such as orange or grapefruit. Tomato juice. Sweets and desserts Chocolate and cocoa. Donuts. Seasonings and condiments Mint, such as peppermint and spearmint. Condiments, herbs, or seasonings that cause symptoms. These may include curry, hot sauce, or vinegar-based salad dressings. The items listed above may not be all the foods and drinks you should avoid. Talk with a dietitian to learn more. Questions to ask your health care provider Changes to your diet and everyday life are often the first steps taken to manage symptoms of GERD. If these changes don't help, talk with your provider about taking medicines. Where to find more information [employer]: aboutgerd.org This information is not intended to replace advice given to you by your health care provider. Make sure you discuss any questions you have with your health care provider. Document Revised: 10/17/2023 Document Reviewed: 05/03/2023 Elsevier Patient Education  2024 Elsevier Inc. Shortness of Breath, Adult Shortness of breath means you have trouble breathing. Shortness of breath could be a  sign of a medical problem. Follow these instructions at home:  Pollution Do not smoke or use any products that contain nicotine  or tobacco. If you need help quitting, ask your doctor. Avoid things that can make it harder to breathe, such as: Smoke of all kinds. This includes smoke from campfires or forest fires. Do not smoke or allow others to smoke in your home. Mold. Dust. Air pollution. Chemical smells. Things that can give you an allergic reaction (allergens) if you have allergies. Keep your living space clean. Use products that help remove mold and dust. General instructions Watch for any changes in your symptoms. Take over-the-counter and prescription medicines only as told by your doctor. This includes oxygen  therapy and inhaled medicines. Rest as needed. Return to your normal activities when your doctor says that it is safe. Keep all follow-up visits. Contact a doctor if: Your condition does not get better as soon as expected. You have a hard time doing your normal activities, even after you rest.  You have new symptoms. You cannot walk up stairs. You cannot exercise the way you normally do. Get help right away if: Your shortness of breath gets worse. You have trouble breathing when you are resting. You feel light-headed or you faint. You have a cough that is not helped by medicines. You cough up blood. You have pain with breathing. You have pain in your chest, arms, shoulders, or belly (abdomen). You have a fever. These symptoms may be an emergency. Get help right away. Call 911. Do not wait to see if the symptoms will go away. Do not drive yourself to the hospital. Summary Shortness of breath is when you have trouble breathing enough air. It can be a sign of a medical problem. Avoid things that make it hard for you to breathe, such as smoking, pollution, mold, and dust. Watch for any changes in your symptoms. Contact your doctor if you do not get better or you get  worse. This information is not intended to replace advice given to you by your health care provider. Make sure you discuss any questions you have with your health care provider. Document Revised: 07/24/2021 Document Reviewed: 07/24/2021 Elsevier Patient Education  2024 Elsevier Inc.  The above assessment and management plan was discussed with the patient. The patient verbalized understanding of and has agreed to the management plan. Patient is aware to call the clinic if they develop any new symptoms or if symptoms persist or worsen. Patient is aware when to return to the clinic for a follow-up visit. Patient educated on when it is appropriate to go to the emergency department.   Buffi Ewton [place] Thompson, DNP [facility] [address] [place], [place] [address] [phone]

## 2024-09-17 NOTE — Telephone Encounter (Signed)
 Pt has appt

## 2024-09-24 DIAGNOSIS — R5383 Other fatigue: Secondary | ICD-10-CM | POA: Diagnosis not present

## 2024-09-24 DIAGNOSIS — Z86718 Personal history of other venous thrombosis and embolism: Secondary | ICD-10-CM | POA: Diagnosis not present

## 2024-09-24 DIAGNOSIS — I10 Essential (primary) hypertension: Secondary | ICD-10-CM | POA: Diagnosis not present

## 2024-09-24 DIAGNOSIS — I2510 Atherosclerotic heart disease of native coronary artery without angina pectoris: Secondary | ICD-10-CM | POA: Diagnosis not present

## 2024-09-24 DIAGNOSIS — J449 Chronic obstructive pulmonary disease, unspecified: Secondary | ICD-10-CM | POA: Diagnosis not present

## 2024-09-24 DIAGNOSIS — E785 Hyperlipidemia, unspecified: Secondary | ICD-10-CM | POA: Diagnosis not present

## 2024-09-24 DIAGNOSIS — Z20822 Contact with and (suspected) exposure to covid-19: Secondary | ICD-10-CM | POA: Diagnosis not present

## 2024-09-24 DIAGNOSIS — R079 Chest pain, unspecified: Secondary | ICD-10-CM | POA: Diagnosis not present

## 2024-09-24 DIAGNOSIS — F17210 Nicotine dependence, cigarettes, uncomplicated: Secondary | ICD-10-CM | POA: Diagnosis not present

## 2024-09-24 DIAGNOSIS — R1013 Epigastric pain: Secondary | ICD-10-CM | POA: Diagnosis not present

## 2024-09-24 DIAGNOSIS — Z79899 Other long term (current) drug therapy: Secondary | ICD-10-CM | POA: Diagnosis not present

## 2024-09-24 DIAGNOSIS — R531 Weakness: Secondary | ICD-10-CM | POA: Diagnosis not present

## 2024-09-24 DIAGNOSIS — Z85118 Personal history of other malignant neoplasm of bronchus and lung: Secondary | ICD-10-CM | POA: Diagnosis not present

## 2024-09-24 DIAGNOSIS — F17220 Nicotine dependence, chewing tobacco, uncomplicated: Secondary | ICD-10-CM | POA: Diagnosis not present

## 2024-09-24 DIAGNOSIS — E119 Type 2 diabetes mellitus without complications: Secondary | ICD-10-CM | POA: Diagnosis not present

## 2024-09-24 DIAGNOSIS — Z7984 Long term (current) use of oral hypoglycemic drugs: Secondary | ICD-10-CM | POA: Diagnosis not present

## 2024-09-24 DIAGNOSIS — R110 Nausea: Secondary | ICD-10-CM | POA: Diagnosis not present

## 2024-09-24 DIAGNOSIS — Z7901 Long term (current) use of anticoagulants: Secondary | ICD-10-CM | POA: Diagnosis not present

## 2024-09-25 ENCOUNTER — Other Ambulatory Visit: Payer: Self-pay | Admitting: Family Medicine

## 2024-09-25 ENCOUNTER — Telehealth: Payer: Self-pay

## 2024-09-25 DIAGNOSIS — C3491 Malignant neoplasm of unspecified part of right bronchus or lung: Secondary | ICD-10-CM | POA: Diagnosis not present

## 2024-09-25 DIAGNOSIS — I82890 Acute embolism and thrombosis of other specified veins: Secondary | ICD-10-CM | POA: Diagnosis not present

## 2024-09-25 DIAGNOSIS — R7989 Other specified abnormal findings of blood chemistry: Secondary | ICD-10-CM | POA: Diagnosis not present

## 2024-09-25 DIAGNOSIS — I10 Essential (primary) hypertension: Secondary | ICD-10-CM

## 2024-09-25 DIAGNOSIS — C799 Secondary malignant neoplasm of unspecified site: Secondary | ICD-10-CM | POA: Diagnosis not present

## 2024-09-25 DIAGNOSIS — K769 Liver disease, unspecified: Secondary | ICD-10-CM | POA: Diagnosis not present

## 2024-09-25 DIAGNOSIS — R7401 Elevation of levels of liver transaminase levels: Secondary | ICD-10-CM | POA: Diagnosis not present

## 2024-09-25 DIAGNOSIS — B449 Aspergillosis, unspecified: Secondary | ICD-10-CM | POA: Diagnosis not present

## 2024-09-25 DIAGNOSIS — D539 Nutritional anemia, unspecified: Secondary | ICD-10-CM | POA: Diagnosis not present

## 2024-09-25 NOTE — Telephone Encounter (Unsigned)
 Copied from CRM #[id number]. Topic: Clinical - Medication Refill >> Sep 25, 2024  9:52 AM Rosaria E wrote: Medication:  potassium chloride  SA (KLOR-CON  M20) 20 MEQ tablet   Has the patient contacted their pharmacy? Yes (Agent: If no, request that the patient contact the pharmacy for the refill. If patient does not wish to contact the pharmacy document the reason why and proceed with request.) (Agent: If yes, when and what did the pharmacy advise?)  This is the patient's preferred pharmacy:  [id number] - [place], [place] - [address] [place] [address] Phone: [phone] Fax: [phone]  Is this the correct pharmacy for this prescription? Yes If no, delete pharmacy and type the correct one.   Has the prescription been filled recently? Yes  Is the patient out of the medication? No. Has 19 doses left, will run out prior to appt with PCP per Wife.   Has the patient been seen for an appointment in the last year OR does the patient have an upcoming appointment? No  Can we respond through MyChart? Yes  Agent: Please be advised that Rx refills may take up to 3 business days. We ask that you follow-up with your pharmacy.

## 2024-09-25 NOTE — Telephone Encounter (Signed)
 Copied from CRM #[id number]. Topic: Clinical - Medical Advice >> Sep 25, 2024  9:54 AM Rosaria BRAVO wrote: Reason for CRM: requesting diet plan/advice for GERD.   Best contact:  [phone]

## 2024-09-26 DIAGNOSIS — Z5112 Encounter for antineoplastic immunotherapy: Secondary | ICD-10-CM | POA: Diagnosis not present

## 2024-09-26 DIAGNOSIS — C799 Secondary malignant neoplasm of unspecified site: Secondary | ICD-10-CM | POA: Diagnosis not present

## 2024-09-26 DIAGNOSIS — C3491 Malignant neoplasm of unspecified part of right bronchus or lung: Secondary | ICD-10-CM | POA: Diagnosis not present

## 2024-09-27 ENCOUNTER — Encounter: Payer: Self-pay | Admitting: Gastroenterology

## 2024-09-27 NOTE — Telephone Encounter (Signed)
 Avoid spicy or fatty foods. Things that have a strong citrus flavor, hot peppers, tomato content. Most sour tasting foods have acid that will exacerbate the reflux. However sodas have a lot of acid that can trigger reflux, but it is masked by having a lot of sugar to disguise the acid.

## 2024-09-30 ENCOUNTER — Telehealth: Payer: Self-pay | Admitting: Family Medicine

## 2024-09-30 NOTE — Telephone Encounter (Signed)
 Left message to call back please go over when patient calls back.

## 2024-09-30 NOTE — Telephone Encounter (Unsigned)
 Copied from CRM #[id number]. Topic: Clinical - Medical Advice >> Sep 25, 2024  9:54 AM Rosaria BRAVO wrote: Reason for CRM: requesting diet plan/advice for GERD.   Best contact:  [phone] >> Sep 30, 2024  4:26 PM Ivette P wrote: Pt wife Madeline called in to follow up on phone call, called [facility] and Rosina ws not available. Please call pt back

## 2024-09-30 NOTE — Telephone Encounter (Signed)
 Patient return call. ?

## 2024-10-01 ENCOUNTER — Emergency Department (HOSPITAL_COMMUNITY)

## 2024-10-01 ENCOUNTER — Inpatient Hospital Stay (HOSPITAL_COMMUNITY)
Admission: EM | Admit: 2024-10-01 | Discharge: 2024-10-04 | DRG: 871 | Disposition: A | Discharge status: Home / Self Care | Attending: Family Medicine | Admitting: Family Medicine

## 2024-10-01 ENCOUNTER — Encounter (HOSPITAL_COMMUNITY): Payer: Self-pay | Admitting: Emergency Medicine

## 2024-10-01 ENCOUNTER — Other Ambulatory Visit: Payer: Self-pay

## 2024-10-01 DIAGNOSIS — E1140 Type 2 diabetes mellitus with diabetic neuropathy, unspecified: Secondary | ICD-10-CM | POA: Diagnosis present

## 2024-10-01 DIAGNOSIS — Y842 Radiological procedure and radiotherapy as the cause of abnormal reaction of the patient, or of later complication, without mention of misadventure at the time of the procedure: Secondary | ICD-10-CM | POA: Diagnosis not present

## 2024-10-01 DIAGNOSIS — J189 Pneumonia, unspecified organism: Secondary | ICD-10-CM | POA: Diagnosis present

## 2024-10-01 DIAGNOSIS — D849 Immunodeficiency, unspecified: Secondary | ICD-10-CM | POA: Diagnosis present

## 2024-10-01 DIAGNOSIS — J440 Chronic obstructive pulmonary disease with acute lower respiratory infection: Secondary | ICD-10-CM | POA: Diagnosis present

## 2024-10-01 DIAGNOSIS — Z79899 Other long term (current) drug therapy: Secondary | ICD-10-CM

## 2024-10-01 DIAGNOSIS — J9601 Acute respiratory failure with hypoxia: Secondary | ICD-10-CM | POA: Diagnosis not present

## 2024-10-01 DIAGNOSIS — F1010 Alcohol abuse, uncomplicated: Secondary | ICD-10-CM | POA: Diagnosis present

## 2024-10-01 DIAGNOSIS — Z452 Encounter for adjustment and management of vascular access device: Secondary | ICD-10-CM | POA: Diagnosis not present

## 2024-10-01 DIAGNOSIS — R627 Adult failure to thrive: Secondary | ICD-10-CM | POA: Diagnosis present

## 2024-10-01 DIAGNOSIS — R0789 Other chest pain: Secondary | ICD-10-CM | POA: Diagnosis not present

## 2024-10-01 DIAGNOSIS — Z7901 Long term (current) use of anticoagulants: Secondary | ICD-10-CM

## 2024-10-01 DIAGNOSIS — J700 Acute pulmonary manifestations due to radiation: Secondary | ICD-10-CM | POA: Diagnosis present

## 2024-10-01 DIAGNOSIS — F17210 Nicotine dependence, cigarettes, uncomplicated: Secondary | ICD-10-CM | POA: Diagnosis present

## 2024-10-01 DIAGNOSIS — Z923 Personal history of irradiation: Secondary | ICD-10-CM

## 2024-10-01 DIAGNOSIS — M1990 Unspecified osteoarthritis, unspecified site: Secondary | ICD-10-CM | POA: Diagnosis present

## 2024-10-01 DIAGNOSIS — A419 Sepsis, unspecified organism: Principal | ICD-10-CM | POA: Diagnosis present

## 2024-10-01 DIAGNOSIS — Z825 Family history of asthma and other chronic lower respiratory diseases: Secondary | ICD-10-CM

## 2024-10-01 DIAGNOSIS — K449 Diaphragmatic hernia without obstruction or gangrene: Secondary | ICD-10-CM | POA: Diagnosis present

## 2024-10-01 DIAGNOSIS — R531 Weakness: Secondary | ICD-10-CM

## 2024-10-01 DIAGNOSIS — K222 Esophageal obstruction: Secondary | ICD-10-CM | POA: Diagnosis not present

## 2024-10-01 DIAGNOSIS — Z7984 Long term (current) use of oral hypoglycemic drugs: Secondary | ICD-10-CM

## 2024-10-01 DIAGNOSIS — K2950 Unspecified chronic gastritis without bleeding: Secondary | ICD-10-CM | POA: Diagnosis not present

## 2024-10-01 DIAGNOSIS — K2289 Other specified disease of esophagus: Secondary | ICD-10-CM | POA: Diagnosis not present

## 2024-10-01 DIAGNOSIS — Z794 Long term (current) use of insulin: Secondary | ICD-10-CM

## 2024-10-01 DIAGNOSIS — Z800 Family history of malignant neoplasm of digestive organs: Secondary | ICD-10-CM

## 2024-10-01 DIAGNOSIS — Z9049 Acquired absence of other specified parts of digestive tract: Secondary | ICD-10-CM

## 2024-10-01 DIAGNOSIS — Z1152 Encounter for screening for COVID-19: Secondary | ICD-10-CM

## 2024-10-01 DIAGNOSIS — E538 Deficiency of other specified B group vitamins: Secondary | ICD-10-CM

## 2024-10-01 DIAGNOSIS — Z9221 Personal history of antineoplastic chemotherapy: Secondary | ICD-10-CM

## 2024-10-01 DIAGNOSIS — Z85118 Personal history of other malignant neoplasm of bronchus and lung: Secondary | ICD-10-CM

## 2024-10-01 DIAGNOSIS — J449 Chronic obstructive pulmonary disease, unspecified: Secondary | ICD-10-CM | POA: Diagnosis present

## 2024-10-01 DIAGNOSIS — I3139 Other pericardial effusion (noninflammatory): Secondary | ICD-10-CM | POA: Diagnosis not present

## 2024-10-01 DIAGNOSIS — Z833 Family history of diabetes mellitus: Secondary | ICD-10-CM

## 2024-10-01 DIAGNOSIS — N401 Enlarged prostate with lower urinary tract symptoms: Secondary | ICD-10-CM | POA: Diagnosis present

## 2024-10-01 DIAGNOSIS — K648 Other hemorrhoids: Secondary | ICD-10-CM | POA: Diagnosis present

## 2024-10-01 DIAGNOSIS — Z806 Family history of leukemia: Secondary | ICD-10-CM

## 2024-10-01 DIAGNOSIS — F17200 Nicotine dependence, unspecified, uncomplicated: Secondary | ICD-10-CM | POA: Diagnosis not present

## 2024-10-01 DIAGNOSIS — K208 Other esophagitis without bleeding: Secondary | ICD-10-CM | POA: Diagnosis not present

## 2024-10-01 DIAGNOSIS — Z9889 Other specified postprocedural states: Secondary | ICD-10-CM

## 2024-10-01 DIAGNOSIS — Z8249 Family history of ischemic heart disease and other diseases of the circulatory system: Secondary | ICD-10-CM

## 2024-10-01 DIAGNOSIS — R079 Chest pain, unspecified: Secondary | ICD-10-CM | POA: Diagnosis not present

## 2024-10-01 DIAGNOSIS — E860 Dehydration: Secondary | ICD-10-CM | POA: Diagnosis present

## 2024-10-01 DIAGNOSIS — Z8616 Personal history of COVID-19: Secondary | ICD-10-CM

## 2024-10-01 DIAGNOSIS — I700 Atherosclerosis of aorta: Secondary | ICD-10-CM | POA: Diagnosis not present

## 2024-10-01 DIAGNOSIS — E782 Mixed hyperlipidemia: Secondary | ICD-10-CM | POA: Diagnosis present

## 2024-10-01 DIAGNOSIS — C3411 Malignant neoplasm of upper lobe, right bronchus or lung: Secondary | ICD-10-CM | POA: Diagnosis not present

## 2024-10-01 DIAGNOSIS — I10 Essential (primary) hypertension: Secondary | ICD-10-CM | POA: Diagnosis present

## 2024-10-01 DIAGNOSIS — K921 Melena: Secondary | ICD-10-CM | POA: Diagnosis present

## 2024-10-01 DIAGNOSIS — R1310 Dysphagia, unspecified: Secondary | ICD-10-CM | POA: Diagnosis not present

## 2024-10-01 DIAGNOSIS — E1165 Type 2 diabetes mellitus with hyperglycemia: Secondary | ICD-10-CM | POA: Diagnosis present

## 2024-10-01 DIAGNOSIS — T380X5A Adverse effect of glucocorticoids and synthetic analogues, initial encounter: Secondary | ICD-10-CM | POA: Diagnosis present

## 2024-10-01 DIAGNOSIS — E669 Obesity, unspecified: Secondary | ICD-10-CM | POA: Diagnosis not present

## 2024-10-01 DIAGNOSIS — Z9089 Acquired absence of other organs: Secondary | ICD-10-CM

## 2024-10-01 DIAGNOSIS — R109 Unspecified abdominal pain: Secondary | ICD-10-CM | POA: Diagnosis not present

## 2024-10-01 DIAGNOSIS — Z86718 Personal history of other venous thrombosis and embolism: Secondary | ICD-10-CM

## 2024-10-01 DIAGNOSIS — F17290 Nicotine dependence, other tobacco product, uncomplicated: Secondary | ICD-10-CM | POA: Diagnosis present

## 2024-10-01 DIAGNOSIS — R351 Nocturia: Secondary | ICD-10-CM | POA: Diagnosis present

## 2024-10-01 DIAGNOSIS — Z860101 Personal history of adenomatous and serrated colon polyps: Secondary | ICD-10-CM

## 2024-10-01 DIAGNOSIS — E871 Hypo-osmolality and hyponatremia: Secondary | ICD-10-CM | POA: Diagnosis present

## 2024-10-01 DIAGNOSIS — K219 Gastro-esophageal reflux disease without esophagitis: Secondary | ICD-10-CM | POA: Diagnosis not present

## 2024-10-01 DIAGNOSIS — Z7969 Long term (current) use of other immunomodulators and immunosuppressants: Secondary | ICD-10-CM

## 2024-10-01 DIAGNOSIS — Z7951 Long term (current) use of inhaled steroids: Secondary | ICD-10-CM

## 2024-10-01 DIAGNOSIS — M1A9XX0 Chronic gout, unspecified, without tophus (tophi): Secondary | ICD-10-CM | POA: Diagnosis present

## 2024-10-01 DIAGNOSIS — Z6835 Body mass index (BMI) 35.0-35.9, adult: Secondary | ICD-10-CM

## 2024-10-01 DIAGNOSIS — Z555 Less than a high school diploma: Secondary | ICD-10-CM

## 2024-10-01 LAB — URINALYSIS, W/ REFLEX TO CULTURE (INFECTION SUSPECTED)
Bacteria, UA: NONE SEEN
Bilirubin Urine: NEGATIVE
Glucose, UA: NEGATIVE mg/dL
Hgb urine dipstick: NEGATIVE
Ketones, ur: NEGATIVE mg/dL
Leukocytes,Ua: NEGATIVE
Nitrite: NEGATIVE
Protein, ur: NEGATIVE mg/dL
Specific Gravity, Urine: 1.024 (ref 1.005–1.030)
pH: 6 (ref 5.0–8.0)

## 2024-10-01 LAB — LACTIC ACID, PLASMA: Lactic Acid, Venous: 1.1 mmol/L (ref 0.5–1.9)

## 2024-10-01 LAB — CBC WITH DIFFERENTIAL/PLATELET
Abs Immature Granulocytes: 0.04 K/uL (ref 0.00–0.07)
Basophils Absolute: 0 K/uL (ref 0.0–0.1)
Basophils Relative: 0 %
Eosinophils Absolute: 0 K/uL (ref 0.0–0.5)
Eosinophils Relative: 0 %
HCT: 32.8 % — ABNORMAL LOW (ref 39.0–52.0)
Hemoglobin: 11.1 g/dL — ABNORMAL LOW (ref 13.0–17.0)
Immature Granulocytes: 0 %
Lymphocytes Relative: 6 %
Lymphs Abs: 0.6 K/uL — ABNORMAL LOW (ref 0.7–4.0)
MCH: 33.8 pg (ref 26.0–34.0)
MCHC: 33.8 g/dL (ref 30.0–36.0)
MCV: 100 fL (ref 80.0–100.0)
Monocytes Absolute: 0.7 K/uL (ref 0.1–1.0)
Monocytes Relative: 8 %
Neutro Abs: 8.2 K/uL — ABNORMAL HIGH (ref 1.7–7.7)
Neutrophils Relative %: 86 %
Platelets: 264 K/uL (ref 150–400)
RBC: 3.28 MIL/uL — ABNORMAL LOW (ref 4.22–5.81)
RDW: 12.6 % (ref 11.5–15.5)
WBC: 9.6 K/uL (ref 4.0–10.5)
nRBC: 0 % (ref 0.0–0.2)

## 2024-10-01 LAB — RESP PANEL BY RT-PCR (RSV, FLU A&B, COVID)  RVPGX2
Influenza A by PCR: NEGATIVE
Influenza B by PCR: NEGATIVE
Resp Syncytial Virus by PCR: NEGATIVE
SARS Coronavirus 2 by RT PCR: NEGATIVE

## 2024-10-01 LAB — COMPREHENSIVE METABOLIC PANEL WITH GFR
ALT: 25 U/L (ref 0–44)
AST: 30 U/L (ref 15–41)
Albumin: 3.4 g/dL — ABNORMAL LOW (ref 3.5–5.0)
Alkaline Phosphatase: 86 U/L (ref 38–126)
Anion gap: 10 (ref 5–15)
BUN: 15 mg/dL (ref 8–23)
CO2: 29 mmol/L (ref 22–32)
Calcium: 9.4 mg/dL (ref 8.9–10.3)
Chloride: 93 mmol/L — ABNORMAL LOW (ref 98–111)
Creatinine, Ser: 1.13 mg/dL (ref 0.61–1.24)
GFR, Estimated: >60 mL/min (ref >60)
Glucose, Bld: 126 mg/dL — ABNORMAL HIGH (ref 70–99)
Potassium: 3.9 mmol/L (ref 3.5–5.1)
Sodium: 132 mmol/L — ABNORMAL LOW (ref 135–145)
Total Bilirubin: 0.7 mg/dL (ref 0.0–1.2)
Total Protein: 7.8 g/dL (ref 6.5–8.1)

## 2024-10-01 LAB — LIPASE, BLOOD: Lipase: 40 U/L (ref 11–51)

## 2024-10-01 LAB — PROTIME-INR
INR: 1.4 — ABNORMAL HIGH (ref 0.8–1.2)
Prothrombin Time: 18 s — ABNORMAL HIGH (ref 11.4–15.2)

## 2024-10-01 LAB — AMMONIA: Ammonia: 39 umol/L — ABNORMAL HIGH (ref 9–35)

## 2024-10-01 LAB — GLUCOSE, CAPILLARY
Glucose-Capillary: 122 mg/dL — ABNORMAL HIGH (ref 70–99)
Glucose-Capillary: 115 mg/dL — ABNORMAL HIGH (ref 70–99)

## 2024-10-01 MED ORDER — BISACODYL 5 MG PO TBEC: 5.0000 mg | DELAYED_RELEASE_TABLET | Freq: Every day | ORAL | Status: DC | PRN

## 2024-10-01 MED ORDER — SODIUM CHLORIDE 0.9 % IV SOLN
2.0000 g | INTRAVENOUS | Status: DC
  Administered 2024-10-01 – 2024-10-03 (×3): 2 g via INTRAVENOUS
  Filled 2024-10-01 (×3): qty 20

## 2024-10-01 MED ORDER — OXYCODONE HCL 5 MG PO TABS: 5.0000 mg | ORAL_TABLET | ORAL | Status: DC | PRN

## 2024-10-01 MED ORDER — FOLIC ACID 1 MG PO TABS
1.0000 mg | ORAL_TABLET | Freq: Every day | ORAL | Status: DC
  Administered 2024-10-01 – 2024-10-04 (×4): 1 mg via ORAL
  Filled 2024-10-01 (×4): qty 1

## 2024-10-01 MED ORDER — NICOTINE 21 MG/24HR TD PT24
21.0000 mg | MEDICATED_PATCH | Freq: Every day | TRANSDERMAL | Status: DC | PRN
  Administered 2024-10-01: 21 mg via TRANSDERMAL
  Filled 2024-10-01 (×2): qty 1

## 2024-10-01 MED ORDER — IPRATROPIUM-ALBUTEROL 0.5-2.5 (3) MG/3ML IN SOLN
3.0000 mL | Freq: Four times a day (QID) | RESPIRATORY_TRACT | Status: DC
Start: 2024-10-01 — End: 2024-10-02
  Administered 2024-10-01 – 2024-10-02 (×6): 3 mL via RESPIRATORY_TRACT
  Filled 2024-10-01 (×6): qty 3

## 2024-10-01 MED ORDER — GUAIFENESIN ER 600 MG PO TB12
600.0000 mg | ORAL_TABLET | Freq: Two times a day (BID) | ORAL | Status: DC
  Administered 2024-10-01 – 2024-10-04 (×7): 600 mg via ORAL
  Filled 2024-10-01 (×8): qty 1

## 2024-10-01 MED ORDER — INSULIN ASPART 100 UNIT/ML IJ SOLN
0.0000 [IU] | Freq: Three times a day (TID) | INTRAMUSCULAR | Status: DC
  Administered 2024-10-01 – 2024-10-02 (×2): 1 [IU] via SUBCUTANEOUS
  Administered 2024-10-03 (×3): 2 [IU] via SUBCUTANEOUS
  Administered 2024-10-04: 1 [IU] via SUBCUTANEOUS

## 2024-10-01 MED ORDER — CHLORDIAZEPOXIDE HCL 5 MG PO CAPS: 5.0000 mg | ORAL_CAPSULE | Freq: Three times a day (TID) | ORAL | Status: DC

## 2024-10-01 MED ORDER — VANCOMYCIN HCL IN DEXTROSE 1-5 GM/200ML-% IV SOLN: 1000.0000 mg | Freq: Once | INTRAVENOUS | Status: DC

## 2024-10-01 MED ORDER — FLUTICASONE FUROATE-VILANTEROL 100-25 MCG/ACT IN AEPB
1.0000 | INHALATION_SPRAY | Freq: Every day | RESPIRATORY_TRACT | Status: DC
  Administered 2024-10-02 – 2024-10-04 (×3): 1 via RESPIRATORY_TRACT
  Filled 2024-10-01: qty 28

## 2024-10-01 MED ORDER — LORAZEPAM 1 MG PO TABS
1.0000 mg | ORAL_TABLET | ORAL | Status: AC | PRN
  Administered 2024-10-01 – 2024-10-03 (×2): 1 mg via ORAL
  Filled 2024-10-01 (×2): qty 1

## 2024-10-01 MED ORDER — VANCOMYCIN HCL 2000 MG/400ML IV SOLN
2000.0000 mg | Freq: Once | INTRAVENOUS | Status: AC
  Administered 2024-10-01: 2000 mg via INTRAVENOUS
  Filled 2024-10-01: qty 400

## 2024-10-01 MED ORDER — ACETAMINOPHEN 650 MG RE SUPP: 650.0000 mg | Freq: Once | RECTAL | Status: AC

## 2024-10-01 MED ORDER — IPRATROPIUM-ALBUTEROL 0.5-2.5 (3) MG/3ML IN SOLN
3.0000 mL | Freq: Once | RESPIRATORY_TRACT | Status: AC
  Administered 2024-10-01: 3 mL via RESPIRATORY_TRACT
  Filled 2024-10-01: qty 3

## 2024-10-01 MED ORDER — LACTATED RINGERS IV SOLN: INTRAVENOUS | Status: AC

## 2024-10-01 MED ORDER — THIAMINE MONONITRATE 100 MG PO TABS
100.0000 mg | ORAL_TABLET | Freq: Every day | ORAL | Status: DC
  Administered 2024-10-01 – 2024-10-04 (×4): 100 mg via ORAL
  Filled 2024-10-01 (×4): qty 1

## 2024-10-01 MED ORDER — ONDANSETRON HCL 4 MG/2ML IJ SOLN: 4.0000 mg | Freq: Four times a day (QID) | INTRAMUSCULAR | Status: DC | PRN

## 2024-10-01 MED ORDER — CHLORDIAZEPOXIDE HCL 5 MG PO CAPS: 10.0000 mg | ORAL_CAPSULE | Freq: Three times a day (TID) | ORAL | Status: DC

## 2024-10-01 MED ORDER — METRONIDAZOLE 500 MG/100ML IV SOLN
500.0000 mg | Freq: Once | INTRAVENOUS | Status: AC
  Administered 2024-10-01: 500 mg via INTRAVENOUS
  Filled 2024-10-01: qty 100

## 2024-10-01 MED ORDER — TAMSULOSIN HCL 0.4 MG PO CAPS
0.4000 mg | ORAL_CAPSULE | Freq: Every day | ORAL | Status: DC
  Administered 2024-10-01 – 2024-10-03 (×3): 0.4 mg via ORAL
  Filled 2024-10-01 (×3): qty 1

## 2024-10-01 MED ORDER — ACETAMINOPHEN 325 MG PO TABS: 650.0000 mg | ORAL_TABLET | Freq: Four times a day (QID) | ORAL | Status: DC | PRN

## 2024-10-01 MED ORDER — ONDANSETRON HCL 4 MG PO TABS: 4.0000 mg | ORAL_TABLET | Freq: Four times a day (QID) | ORAL | Status: DC | PRN

## 2024-10-01 MED ORDER — SUCRALFATE 1 GM/10ML PO SUSP
1.0000 g | Freq: Three times a day (TID) | ORAL | Status: DC
  Administered 2024-10-01 – 2024-10-04 (×9): 1 g via ORAL
  Filled 2024-10-01 (×9): qty 10

## 2024-10-01 MED ORDER — THIAMINE HCL 100 MG/ML IJ SOLN: 100.0000 mg | Freq: Every day | INTRAMUSCULAR | Status: DC

## 2024-10-01 MED ORDER — APIXABAN 5 MG PO TABS
5.0000 mg | ORAL_TABLET | Freq: Two times a day (BID) | ORAL | Status: DC
  Administered 2024-10-01: 5 mg via ORAL
  Filled 2024-10-01: qty 1

## 2024-10-01 MED ORDER — ADULT MULTIVITAMIN W/MINERALS CH
1.0000 | ORAL_TABLET | Freq: Every day | ORAL | Status: DC
  Administered 2024-10-01 – 2024-10-03 (×3): 1 via ORAL
  Filled 2024-10-01 (×3): qty 1

## 2024-10-01 MED ORDER — SODIUM CHLORIDE 0.9 % IV SOLN
500.0000 mg | INTRAVENOUS | Status: DC
  Administered 2024-10-02 – 2024-10-04 (×3): 500 mg via INTRAVENOUS
  Filled 2024-10-01 (×3): qty 5

## 2024-10-01 MED ORDER — LACTATED RINGERS IV BOLUS (SEPSIS)
1000.0000 mL | Freq: Once | INTRAVENOUS | Status: AC
  Administered 2024-10-01: 1000 mL via INTRAVENOUS

## 2024-10-01 MED ORDER — TRAZODONE HCL 50 MG PO TABS
50.0000 mg | ORAL_TABLET | Freq: Every evening | ORAL | Status: DC | PRN
  Administered 2024-10-02 – 2024-10-03 (×2): 50 mg via ORAL
  Filled 2024-10-01 (×2): qty 1

## 2024-10-01 MED ORDER — APIXABAN (ELIQUIS) VTE STARTER PACK (10MG AND 5MG): 5.0000 mg | ORAL_TABLET | Freq: Two times a day (BID) | ORAL | Status: DC

## 2024-10-01 MED ORDER — ALLOPURINOL 100 MG PO TABS
100.0000 mg | ORAL_TABLET | Freq: Every day | ORAL | Status: DC
  Administered 2024-10-01 – 2024-10-04 (×4): 100 mg via ORAL
  Filled 2024-10-01 (×4): qty 1

## 2024-10-01 MED ORDER — ACETAMINOPHEN 650 MG RE SUPP: 650.0000 mg | Freq: Four times a day (QID) | RECTAL | Status: DC | PRN

## 2024-10-01 MED ORDER — IOHEXOL 300 MG/ML  SOLN
100.0000 mL | Freq: Once | INTRAMUSCULAR | Status: AC | PRN
  Administered 2024-10-01: 100 mL via INTRAVENOUS

## 2024-10-01 MED ORDER — SODIUM CHLORIDE 0.9 % IV SOLN
2.0000 g | Freq: Once | INTRAVENOUS | Status: AC
  Administered 2024-10-01: 2 g via INTRAVENOUS
  Filled 2024-10-01: qty 12.5

## 2024-10-01 MED ORDER — CHLORDIAZEPOXIDE HCL 5 MG PO CAPS: 5.0000 mg | ORAL_CAPSULE | Freq: Every day | ORAL | Status: DC

## 2024-10-01 MED ORDER — ACETAMINOPHEN 650 MG RE SUPP
RECTAL | Status: AC
  Filled 2024-10-01: qty 1

## 2024-10-01 MED ORDER — FENTANYL CITRATE (PF) 100 MCG/2ML IJ SOLN: 25.0000 ug | INTRAMUSCULAR | Status: DC | PRN

## 2024-10-01 MED ORDER — LORAZEPAM 2 MG/ML IJ SOLN: 1.0000 mg | INTRAMUSCULAR | Status: AC | PRN

## 2024-10-01 MED ORDER — PANTOPRAZOLE SODIUM 40 MG IV SOLR
40.0000 mg | Freq: Two times a day (BID) | INTRAVENOUS | Status: DC
  Administered 2024-10-01 – 2024-10-04 (×7): 40 mg via INTRAVENOUS
  Filled 2024-10-01 (×7): qty 10

## 2024-10-01 NOTE — ED Notes (Signed)
 Made Pt aware we need a urine sample, Pt said he couldn't go right now would let us  know when he could.

## 2024-10-01 NOTE — ED Provider Notes (Signed)
 [place] EMERGENCY DEPARTMENT AT [name] Provider Note   CSN: [id number] Arrival date & time: 10/01/24  9267     Patient presents with: Abdominal Pain   Juan Merritt is a 73 y.o. male.   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  Pt has been having severe pain in his chest which feels like fire.  Pt has been on PPIs, but they have not helped.  He has an appt with GI, but not until November.  Pt did tell the nurse he is a daily drinker, but has not drank anything in 3 days.  Family said he had a fever this am, but did not take anything for it.       Prior to Admission medications   Medication Sig Start Date End Date Taking? Authorizing Provider  acetaminophen  (TYLENOL ) 500 MG tablet Take 1,000 mg by mouth 2 (two) times daily as needed for moderate pain (pain score 4-6), fever or headache.   Yes [provider]  albuterol  (VENTOLIN  HFA) 108 (90 Base) MCG/ACT inhaler INHALE 2 PUFFS INTO THE LUNGS TWICE A DAY 03/04/24  Yes Stacks, Butler, MD  allopurinol  (ZYLOPRIM ) 100 MG tablet TAKE 1 TABLET (100 MG TOTAL) BY MOUTH DAILY. TO PREVENT GOUT 05/07/24  Yes Zollie Butler, MD  Apixaban Starter Pack, 10mg  and 5mg , (ELIQUIS DVT/PE STARTER PACK) Take 5 mg by mouth 2 (two) times daily. 05/08/24 10/01/24 Yes [provider]  budesonide -formoterol  (SYMBICORT ) 160-4.5 MCG/ACT inhaler 2 puffs first thing in am and then another 2 puffs about 12 hours later. 02/14/24  Yes Darlean Ozell NOVAK, MD  Continuous Glucose Sensor (FREESTYLE LIBRE 2 SENSOR) MISC USE TO TEST BLOOD SUGAR CONTINUOUSLY. Dx. E11.65 SEND TO [facility] FOR MEDICARE COVERAGE, DO NOT SEND TO LOCAL PHARMACY 08/29/24  Yes Stacks, Butler, MD  cyanocobalamin  (VITAMIN B12) 1000 MCG tablet Take 1 tablet (1,000 mcg total) by mouth daily. 09/12/24  Yes Joesph Annabella HERO, FNP  folic acid  (FOLATE) 400 MCG tablet Take 400 mcg by mouth daily.   Yes [provider]  ipratropium-albuterol  (DUONEB) 0.5-2.5 (3) MG/3ML SOLN Take 3 mLs by nebulization every 6 (six) hours as needed. 09/17/24  Yes [facility] Morton Hummer, Nena, NP  Lactobacillus (PROBIOTIC ACIDOPHILUS PO) Take 1 capsule by mouth daily.   Yes [provider]  lidocaine  (XYLOCAINE ) 2 % solution Use as directed 15 mLs in the mouth or throat. 09/26/24  Yes [provider]  metFORMIN  (GLUCOPHAGE -XR) 750 MG 24 hr tablet Take 1 tablet (750 mg total) by mouth daily with breakfast. 08/07/24  Yes Zollie Butler, MD  pantoprazole  (PROTONIX ) 40 MG tablet Take 1 tablet (40 mg total) by mouth daily. For stomach 08/07/24  Yes Stacks, Butler, MD  potassium chloride  SA (KLOR-CON  M20) 20 MEQ tablet TAKE 1 TABLET (20 MEQ TOTAL) DAILY BY MOUTH. Patient taking differently: Take 40 mEq by mouth 2 (two) times daily. 08/07/24  Yes Zollie Butler, MD  tamsulosin  (FLOMAX ) 0.4 MG CAPS capsule TAKE 2 CAPSULES BY MOUTH AT BEDTIME FOR URINE FLOW AND PROSTATE 03/28/24  Yes Stacks, Butler, MD  torsemide  (DEMADEX ) 100 MG tablet Take 100 mg by mouth 2 (two) times daily.   Yes [provider]    Allergies: Patient has no known allergies.    Review of Systems  Constitutional:  Positive for fever.  Cardiovascular:  Positive  for chest pain.  All other systems reviewed and are negative.   Updated Vital Signs BP (!) 122/101   Pulse (!) 101   Temp 98.7 F (37.1 C) (Oral)   Resp (!) 22   Ht 5' 10 (1.778 m)   Wt 112 kg   SpO2 97%   BMI 35.44 kg/m   Physical Exam Vitals and nursing note reviewed.  Constitutional:      Appearance: He is well-developed. He is obese. He is ill-appearing.  HENT:     Head: Normocephalic and atraumatic.     Mouth/Throat:     Mouth: Mucous membranes are dry.     Pharynx: Oropharynx is clear.  Eyes:     Extraocular Movements: Extraocular movements intact.     Pupils:  Pupils are equal, round, and reactive to light.  Cardiovascular:     Rate and Rhythm: Regular rhythm. Tachycardia present.     Heart sounds: Normal heart sounds.  Pulmonary:     Effort: Pulmonary effort is normal.     Breath sounds: Wheezing present.  Abdominal:     General: Abdomen is flat. Bowel sounds are normal.     Palpations: Abdomen is soft.  Skin:    General: Skin is warm.     Capillary Refill: Capillary refill takes less than 2 seconds.  Neurological:     General: No focal deficit present.     Mental Status: He is alert and oriented to person, place, and time.  Psychiatric:        Mood and Affect: Mood normal.        Behavior: Behavior normal.     (all labs ordered are listed, but only abnormal results are displayed) Labs Reviewed  CBC WITH DIFFERENTIAL/PLATELET - Abnormal; Notable for the following components:      Result Value   RBC 3.28 (*)    Hemoglobin 11.1 (*)    HCT 32.8 (*)    Neutro Abs 8.2 (*)    Lymphs Abs 0.6 (*)    All other components within normal limits  COMPREHENSIVE METABOLIC PANEL WITH GFR - Abnormal; Notable for the following components:   Sodium 132 (*)    Chloride 93 (*)    Glucose, Bld 126 (*)    Albumin 3.4 (*)    All other components within normal limits  PROTIME-INR - Abnormal; Notable for the following components:   Prothrombin Time 18.0 (*)    INR 1.4 (*)    All other components within normal limits  AMMONIA - Abnormal; Notable for the following components:   Ammonia 39 (*)    All other components within normal limits  CULTURE, BLOOD (ROUTINE X 2)  CULTURE, BLOOD (ROUTINE X 2)  RESP PANEL BY RT-PCR (RSV, FLU A&B, COVID)  [id number]  LIPASE, BLOOD  LACTIC ACID, PLASMA  LACTIC ACID, PLASMA  URINALYSIS, W/ REFLEX TO CULTURE (INFECTION SUSPECTED)    EKG: EKG Interpretation Date/Time:  Tuesday October 01 2024 07:50:42 EDT Ventricular Rate:  131 PR Interval:  159 QRS Duration:  104 QT Interval:  296 QTC Calculation: 437 R  Axis:   79  Text Interpretation: Sinus tachycardia Probable left atrial enlargement Borderline T abnormalities, inferior leads Since last tracing rate faster Confirmed by Dean Clarity [phone]) on 10/01/2024 7:58:38 AM  Radiology: CT CHEST ABDOMEN PELVIS W CONTRAST Result Date: 10/01/2024 CLINICAL DATA:  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 Completed radiation therapy and chemotherapy 04/15/2024 on maintenance pembrolizumab  immunotherapy. Sepsis with  chest and abdominal pain with eating/drinking. * Tracking Code: BO * EXAM: CT CHEST, ABDOMEN, AND PELVIS WITH CONTRAST TECHNIQUE: Multidetector CT imaging of the chest, abdomen and pelvis was performed following the standard protocol during bolus administration of intravenous contrast. RADIATION DOSE REDUCTION: This exam was performed according to the departmental dose-optimization program which includes automated exposure control, adjustment of the mA and/or kV according to patient size and/or use of iterative reconstruction technique. CONTRAST:  OMNIPAQUE  IOHEXOL  300 MG/ML  SOLN COMPARISON:  03/31/2024 FINDINGS: CT CHEST FINDINGS Cardiovascular: Left Port-A-Cath tip: SVC. Coronary, aortic arch, and branch vessel atherosclerotic vascular disease. Small anterior pericardial effusion, new compared to 03/31/2024. Mediastinum/Nodes: Subtle stranding in the mediastinum particularly around the trachea and tracheoesophageal groove. Small hilar, subcarinal, and paratracheal lymph nodes are not pathologically enlarged by size criteria. Lungs/Pleura: The right upper lobe pulmonary nodule measures 2.4 by 1.2 cm on image 30 series 3, formerly 3.3 by 1.8 cm on 03/31/2024. Previous cavitary component no longer present. Bandlike density extending around the nodule and below the nodule in the right upper lobe is likely attributable to radiation pneumonitis. Right upper lobe peribronchovascular volume loss with substantial  bilateral airway thickening worsened from previous, with some airway plugging particularly in the left lower lobe. The airway thickening causes luminal narrowing. Musculoskeletal: Mild thoracic spondylosis. CT ABDOMEN PELVIS FINDINGS Hepatobiliary: Faint transient hepatic attenuation difference in segment 4 of the liver without definite underlying lesion identified. Gallbladder surgically absent. No biliary dilatation. Pancreas: Unremarkable Spleen: Unremarkable Adrenals/Urinary Tract: Unremarkable Stomach/Bowel: There are few scattered sigmoid colon diverticula. No dilated bowel. No specific gastric or duodenal abnormality observed. Formed stool noted in the colon. Vascular/Lymphatic: Atherosclerosis is present, including aortoiliac atherosclerotic disease. Atheromatous plaque noted proximally in the celiac trunk and SMA but not causing occlusion or high-grade stenosis. Reproductive: Unremarkable Other: No supplemental non-categorized findings. Musculoskeletal: Lumbar spondylosis and degenerative disc disease, causing bilateral moderate foraminal impingement at L5-S1. IMPRESSION: 1. The right upper lobe pulmonary nodule is smaller than on the prior exam, currently 2.4 by 1.2 cm, formerly 3.3 by 1.8 cm. Previous cavitary component no longer present. 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 2. Tracheal and bilateral airway thickening with some airway plugging particularly in the left lower lobe. Infectious etiology favored given the diffuse pattern, a component of reactive airways disease is not excluded. 3. Small anterior pericardial effusion, new compared to 03/31/2024. 4. Subtle stranding in the mediastinum particularly around the trachea and tracheoesophageal groove, likely inflammatory. 5. Lumbar spondylosis and degenerative disc disease causing bilateral moderate foraminal impingement at L5-S1. 6. Aortic Atherosclerosis (ICD10-I70.0). Systemic and coronary atherosclerosis. Electronically Signed   By:  Ryan Salvage M.D.   On: 10/01/2024 10:56   DG Chest Portable 1 View Result Date: 10/01/2024 EXAM: 1 VIEW(S) XRAY OF THE CHEST 10/01/2024 08:05:00 AM COMPARISON: Prior study. CLINICAL HISTORY: cp. Per triage; Pt in by POV for abd pain / burning. Pt states this is his 3rd ER visit in the last 2 weeks for this pain. Pt can not eat or drink without a fire burning sensation in his chest and abdomen. Pt has had this ongoing since his cancer radiation in April. Pt ; can not get in to see GI until November. FINDINGS: LINES, TUBES AND DEVICES: Left IJ port catheter to the SVC. LUNGS AND PLEURA: Improved aeration in the lung bases since prior study. No focal pulmonary opacity. No pulmonary edema. No pleural effusion. No pneumothorax. HEART AND MEDIASTINUM: Aortic atherosclerosis. No acute abnormality of the cardiac and mediastinal silhouettes.  BONES AND SOFT TISSUES: No acute osseous abnormality. IMPRESSION: 1. No acute cardiopulmonary process. 2. Improved aeration in the lung bases. Electronically signed by: Katheleen Faes MD 10/01/2024 08:09 AM EDT RP Workstation: [id number]     Procedures   Medications Ordered in the ED  vancomycin  (VANCOREADY) IVPB 2000 mg/400 mL (2,000 mg Intravenous New Bag/Given 10/01/24 1133)  lactated ringers  bolus 1,000 mL (0 mLs Intravenous Stopped 10/01/24 1103)  ceFEPIme  (MAXIPIME ) 2 g in sodium chloride  0.9 % 100 mL IVPB (0 g Intravenous Stopped 10/01/24 0930)  metroNIDAZOLE  (FLAGYL ) IVPB 500 mg (0 mg Intravenous Stopped 10/01/24 1103)  acetaminophen  (TYLENOL ) suppository 650 mg ( Rectal Given 10/01/24 0822)  ipratropium-albuterol  (DUONEB) 0.5-2.5 (3) MG/3ML nebulizer solution 3 mL (3 mLs Nebulization Given 10/01/24 0902)  iohexol  (OMNIPAQUE ) 300 MG/ML solution 100 mL (100 mLs Intravenous Contrast Given 10/01/24 0954)                                    Medical Decision Making Amount and/or Complexity of Data Reviewed Labs: ordered. Radiology: ordered.  Risk OTC  drugs. Prescription drug management. Decision regarding hospitalization.   This patient presents to the ED for concern of cp, this involves an extensive number of treatment options, and is a complaint that carries with it a high risk of complications and morbidity.  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  The pertinent results include:  cbc with hgb 11.1 (12.4 on 9/24); cmp with na sl low at 132 otherwise nl; lip nl; lactic 1.1; covid/flu/rsv neg; inr 1.4; ammonia elevated at 39   Imaging Studies ordered:  I ordered imaging studies including cxr and ct chest/abd/pelvis I independently visualized and interpreted imaging which showed  CXR:  No acute cardiopulmonary process.  2. Improved aeration in the lung bases.    CT: . The right upper lobe pulmonary nodule is smaller than on the  prior exam, currently 2.4 by 1.2 cm, formerly 3.3 by 1.8 cm.  Previous cavitary component no longer present. Adjacent suspected  radiation pneumonitis noted.  2. Tracheal and bilateral airway thickening with some airway  plugging particularly in the left lower lobe. Infectious etiology  favored given the diffuse pattern, a component of reactive airways  disease is not excluded.  3. Small anterior pericardial effusion, new compared to 03/31/2024.  4. Subtle stranding in the mediastinum particularly around the  trachea and tracheoesophageal groove, likely inflammatory.  5. Lumbar spondylosis and degenerative disc disease causing  bilateral moderate foraminal  impingement at L5-S1.  6. Aortic Atherosclerosis (ICD10-I70.0). Systemic and coronary  atherosclerosis.    I agree with the radiologist interpretation   Cardiac Monitoring:  The patient was maintained on a cardiac monitor.  I personally viewed and interpreted the cardiac monitored which showed an underlying rhythm of: st   Medicines ordered and prescription drug management:  I ordered medication including ivfs/tylenol /vanc/maxipime /flagyl   for sx  Reevaluation of the patient after these medicines showed that the patient improved I have reviewed the patients home medicines and have made adjustments as needed  Test Considered:  ct   Critical Interventions:  Ivfs/abx   Consultations Obtained:  I requested consultation with the hospitalist (Dr. Vicci),  and discussed lab and imaging findings as well as pertinent plan -he will admit   Problem List / ED Course:  Sepsis with cap:  pt given ivfs and iv maxipime , flagyl , and vanc.  Pt's temp has responded to tylenol .   GERD sx:  pt will need to see GI, but that's not his major issue today.   Reevaluation:  After the interventions noted above, I reevaluated the patient and found that they have :improved   Social Determinants of Health:  Lives at home   Dispostion:  After consideration of the diagnostic results and the patients response to treatment, I feel that the patent would benefit from admission.  CRITICAL CARE Performed by: Mliss Boyers   Total critical care time: 30 minutes  Critical care time was exclusive of separately billable procedures and treating other patients.  Critical care was necessary to treat or prevent imminent or life-threatening deterioration.  Critical care was time spent personally by me on the following activities: development of treatment plan with patient and/or surrogate as well as nursing, discussions with consultants, evaluation of patient's response to treatment, examination  of patient, obtaining history from patient or surrogate, ordering and performing treatments and interventions, ordering and review of laboratory studies, ordering and review of radiographic studies, pulse oximetry and re-evaluation of patient's condition.        Final diagnoses:  Sepsis without acute organ dysfunction, due to unspecified organism [facility])  Community acquired pneumonia of left lower lobe of lung  Gastroesophageal reflux disease, unspecified whether esophagitis present    ED Discharge Orders     None          Boyers Mliss, MD 10/01/24 1157

## 2024-10-01 NOTE — Consult Note (Signed)
 Gastroenterology Consult   Referring Provider: No ref. provider found Primary Care Physician:  Zollie Lowers, MD Primary Gastroenterologist:  previously Dr. Legrand (new appt scheduled for 11/17 with Sonny Kerns, PA) primary GI will likely be Dr. Eartha or Dr. Shaaron  Patient ID: Juan Merritt; [id number]; December 09, 1951   Admit date: 10/01/2024  LOS: 0 days   Date of Consultation: 10/01/2024  Reason for Consultation:  concern for radiation esophagitis  History of Present Illness   Juan Merritt is a 73 y.o. 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  Patient admitted to daily EtOH use.  GI consulted for further evaluation and management given his outpatient appointment does not for another 4 to 6 weeks.  ED course: Family reported a fever earlier this morning but did not take anything at home. Labs -hemoglobin 11.1, no leukocytosis or leukopenia.  Sodium 132, glucose 126, albumin 3.4.  Normal LFTs and creatinine.  Lipase within normal limits.  Lactate within normal limits.  INR elevated at 1.4.  Ammonia 39 Blood cultures pending UA negative Respiratory panel negative CXR with no acute cardiopulmonary process.  Improved aeration of the lung bases CT chest, abdomen, pelvis with contrast: RUL pulmonary nodule smaller than prior examination, adjacent suspected radiation pneumonitis.  Tracheal and bilateral airway thickening with airway plugging particularly in the left lower lobe suspicious for infectious etiology.  Small anterior pericardial effusion new compared to April.  Subtle stranding in the mediastinum particularly around trachea and tracheoesophageal groove likely inflammatory.  Faint hepatic attenuation difference in segment 4 liver without  definite underlying lesion.  Evidence of cholecystectomy  Consult:  He has been experiencing a burning sensation that starts in the abdomen and sometimes comes all the way up to the throat. This pain has persisted for over a month and has worsened in the past week. Initially, a magic mouthwash containing Maalox and xylocaine  provided relief, but now it only offers temporary relief for about 30 to 45 minutes. He has been taking pantoprazole  40 mg once daily, administered 30 to 60 minutes before meals, but it has not effectively alleviated his symptoms. He denies nausea, vomiting, changes in appetite. Does have odynophagia with eating or drinking but denies any significant dysphagia. Denies overt epigastric pain.  He was prescribed clindamycin for five days two weeks ago and recently on a 5 day steroid taper for wheezing. His inhaler regimen includes Duoneb as needed, Symbicort  twice daily, and an albuterol  rescue inhaler. He does not rinse his mouth after using Symbicort . He has a history of lung cancer with radiation therapy ending in April, which initially caused symptoms and with supportive treatment it resolved, but they have returned with increased severity.  He also has internal hemorrhoids and has recently experienced blood in his stool. Laxatives were prescribed due to constipation from pain medications (lactulose), which he used successfully a few days ago. Constipation is not a regular issue.   He has a history of smoking, currently smoking about a pack a day after initially reducing his intake following his cancer diagnosis to 1/2 ppd but then increased back to at least 1 ppd. He drinks alcohol occasionally per patient, consuming two to three beers several times a week during good weather. Wife reports this is usually 2-3 beers or  more on almost daily basis. He has been experiencing shortness of breath and required oxygen  support during his current hospital stay.  He has difficulty sleeping due  to the chest burning and pain, often only managing short naps. Recently, he managed to sleep for four hours after taking melatonin and Tylenol  PM. He has a history of gallbladder removal approximately 20 years ago.  Colonoscopy December 2022 by Dr. Legrand: - Diverticulosis from sigmoid to hepatic flexure.  - Two 4 to 8 mm polyps in the transverse colon and in the ascending colon, removed with a cold snare. Resected and retrieved.  - One 5 mm polyp in the sigmoid colon, removed with a cold snare. Resected and retrieved.  - Internal hemorrhoids.  - The examination was otherwise normal on direct and retroflexion views. - Redundant and Tortuous colon with significant looping requiring manual pressure - Path: tubular adenomas - Recommended 3 year surveillance  Past Medical History:  Diagnosis Date   Arthritis    BPH associated with nocturia    Cancer (HCC)    stage III non-small cell lung cancer.   Chronic gout    COPD (chronic obstructive pulmonary disease) (HCC)    not on home o2   History of colon polyps    History of COVID-19    04/ 2019 hospital admission in epic, severe sepsis due to covid w/ acute respiratory failure;   and 05/ 2021 mild to moderate symptoms, recovered at home   Hyperlipidemia, mixed    Hypertension    Phimosis    Type 2 diabetes mellitus (HCC)    followed by pcp   (05-17-2022  pt stated checks sugar multiple times daily w/ Libre,  fasting average --- 100-110)    Past Surgical History:  Procedure Laterality Date   BRONCHIAL BIOPSY  01/22/2024   Procedure: BRONCHIAL BIOPSIES;  Surgeon: Shelah Lamar RAMAN, MD;  Location: [facility] ENDOSCOPY;  Service: Pulmonary;;   BRONCHIAL BRUSHINGS  01/22/2024   Procedure: BRONCHIAL BRUSHINGS;  Surgeon: Shelah Lamar RAMAN, MD;  Location: [facility] ENDOSCOPY;  Service: Pulmonary;;   BRONCHIAL NEEDLE ASPIRATION BIOPSY  01/22/2024   Procedure: BRONCHIAL NEEDLE ASPIRATION BIOPSIES;  Surgeon: Shelah Lamar RAMAN, MD;  Location: [facility] ENDOSCOPY;  Service:  Pulmonary;;   CIRCUMCISION N/A 05/19/2022   Procedure: CIRCUMCISION ADULT;  Surgeon: Matilda Senior, MD;  Location[facility];  Service: Urology;  Laterality: N/A;  45 MINUTES   COLONOSCOPY  12/01/2021   by dr legrand   ENDOBRONCHIAL ULTRASOUND  01/22/2024   Procedure: ENDOBRONCHIAL ULTRASOUND;  Surgeon: Shelah Lamar RAMAN, MD;  Location[facility] ENDOSCOPY;  Service: Pulmonary;;   FINE NEEDLE ASPIRATION  01/22/2024   Procedure: FINE NEEDLE ASPIRATION (FNA) LINEAR;  Surgeon: Shelah Lamar RAMAN, MD;  Location: [facility] ENDOSCOPY;  Service: Pulmonary;;   LAPAROSCOPIC CHOLECYSTECTOMY  2008   in [place]   LEFT HEART CATH     TONSILLECTOMY     child   VIDEO BRONCHOSCOPY WITH RADIAL ENDOBRONCHIAL ULTRASOUND  01/22/2024   Procedure: VIDEO BRONCHOSCOPY WITH RADIAL ENDOBRONCHIAL ULTRASOUND;  Surgeon: Shelah Lamar RAMAN, MD;  Location: [facility] ENDOSCOPY;  Service: Pulmonary;;    Prior to Admission medications   Medication Sig Start Date End Date Taking? Authorizing Provider  acetaminophen  (TYLENOL ) 500 MG tablet Take 1,000 mg by mouth 2 (two) times daily as needed for moderate pain (pain score 4-6), fever or headache.   Yes [provider]  albuterol  (VENTOLIN  HFA) 108 (90 Base) MCG/ACT inhaler INHALE 2 PUFFS INTO THE LUNGS TWICE A DAY 03/04/24  Yes Zollie Lowers, MD  allopurinol  (ZYLOPRIM ) 100 MG tablet TAKE 1 TABLET (100 MG TOTAL) BY MOUTH DAILY. TO PREVENT GOUT 05/07/24  Yes Zollie Lowers, MD  Apixaban Starter Pack, 10mg  and 5mg , (ELIQUIS DVT/PE STARTER PACK) Take 5 mg by mouth 2 (two) times daily. 05/08/24 10/01/24 Yes [provider]  budesonide -formoterol  (SYMBICORT ) 160-4.5 MCG/ACT inhaler 2 puffs first thing in am and then another 2 puffs about 12 hours later. 02/14/24  Yes Darlean Ozell NOVAK, MD  Continuous Glucose Sensor (FREESTYLE LIBRE 2 SENSOR) MISC USE TO TEST BLOOD SUGAR CONTINUOUSLY. Dx. E11.65 SEND TO [facility] FOR MEDICARE COVERAGE, DO NOT SEND TO LOCAL PHARMACY 08/29/24   Yes Stacks, Lowers, MD  cyanocobalamin  (VITAMIN B12) 1000 MCG tablet Take 1 tablet (1,000 mcg total) by mouth daily. 09/12/24  Yes Joesph Annabella HERO, FNP  folic acid  (FOLATE) 400 MCG tablet Take 400 mcg by mouth daily.   Yes [provider]  ipratropium-albuterol  (DUONEB) 0.5-2.5 (3) MG/3ML SOLN Take 3 mLs by nebulization every 6 (six) hours as needed. 09/17/24  Yes [facility] Morton Hummer, Nena, NP  Lactobacillus (PROBIOTIC ACIDOPHILUS PO) Take 1 capsule by mouth daily.   Yes [provider]  lidocaine  (XYLOCAINE ) 2 % solution Use as directed 15 mLs in the mouth or throat. 09/26/24  Yes [provider]  metFORMIN  (GLUCOPHAGE -XR) 750 MG 24 hr tablet Take 1 tablet (750 mg total) by mouth daily with breakfast. 08/07/24  Yes Zollie Lowers, MD  pantoprazole  (PROTONIX ) 40 MG tablet Take 1 tablet (40 mg total) by mouth daily. For stomach 08/07/24  Yes Stacks, Lowers, MD  potassium chloride  SA (KLOR-CON  M20) 20 MEQ tablet TAKE 1 TABLET (20 MEQ TOTAL) DAILY BY MOUTH. Patient taking differently: Take 40 mEq by mouth 2 (two) times daily. 08/07/24  Yes Zollie Lowers, MD  tamsulosin  (FLOMAX ) 0.4 MG CAPS capsule TAKE 2 CAPSULES BY MOUTH AT BEDTIME FOR URINE FLOW AND PROSTATE 03/28/24  Yes Zollie Lowers, MD  torsemide  (DEMADEX ) 100 MG tablet Take 100 mg by mouth 2 (two) times daily.   Yes [provider]    Current Facility-Administered Medications  Medication Dose Route Frequency Provider Last Rate Last Admin   acetaminophen  (TYLENOL ) tablet 650 mg  650 mg Oral Q6H PRN Johnson, Clanford L, MD       Or   acetaminophen  (TYLENOL ) suppository 650 mg  650 mg Rectal Q6H PRN Johnson, Clanford L, MD       allopurinol  (ZYLOPRIM ) tablet 100 mg  100 mg Oral Daily Johnson, Clanford L, MD   100 mg at 10/01/24 1332   apixaban (ELIQUIS) tablet 5 mg  5 mg Oral BID Johnson, Clanford L, MD   5 mg at 10/01/24 1332   [START ON 10/02/2024] azithromycin  (ZITHROMAX ) 500 mg in sodium chloride  0.9 % 250  mL IVPB  500 mg Intravenous Q24H Johnson, Clanford L, MD       bisacodyl (DULCOLAX) EC tablet 5 mg  5 mg Oral Daily PRN Johnson, Clanford L, MD       cefTRIAXone (ROCEPHIN) 2 g in sodium chloride  0.9 % 100 mL IVPB  2 g Intravenous Q24H Johnson, Clanford L, MD       fentaNYL  (SUBLIMAZE ) injection 25 mcg  25 mcg Intravenous Q2H PRN Johnson, Clanford L, MD       [START ON 10/02/2024] fluticasone  furoate-vilanterol (BREO ELLIPTA) 100-25 MCG/ACT 1 puff  1 puff Inhalation Daily Johnson, Clanford L, MD       folic acid  (FOLVITE ) tablet 1 mg  1  mg Oral Daily Johnson, Clanford L, MD   1 mg at 10/01/24 1332   guaiFENesin  (MUCINEX ) 12 hr tablet 600 mg  600 mg Oral BID Johnson, Clanford L, MD   600 mg at 10/01/24 1332   insulin  aspart (novoLOG ) injection 0-9 Units  0-9 Units Subcutaneous TID WC Johnson, Clanford L, MD       ipratropium-albuterol  (DUONEB) 0.5-2.5 (3) MG/3ML nebulizer solution 3 mL  3 mL Nebulization Q6H Johnson, Clanford L, MD       lactated ringers  infusion   Intravenous Continuous Vicci, Clanford L, MD 50 mL/hr at 10/01/24 1338 New Bag at 10/01/24 1338   LORazepam  (ATIVAN ) tablet 1-4 mg  1-4 mg Oral Q1H PRN Vicci, Clanford L, MD   1 mg at 10/01/24 1332   Or   LORazepam  (ATIVAN ) injection 1-4 mg  1-4 mg Intravenous Q1H PRN Vicci, Clanford L, MD       multivitamin with minerals tablet 1 tablet  1 tablet Oral Daily Johnson, Clanford L, MD   1 tablet at 10/01/24 1333   nicotine  (NICODERM CQ  - dosed in mg/24 hours) patch 21 mg  21 mg Transdermal Daily PRN Vicci, Clanford L, MD   21 mg at 10/01/24 1339   ondansetron  (ZOFRAN ) tablet 4 mg  4 mg Oral Q6H PRN Johnson, Clanford L, MD       Or   ondansetron  (ZOFRAN ) injection 4 mg  4 mg Intravenous Q6H PRN Johnson, Clanford L, MD       oxyCODONE  (Oxy IR/ROXICODONE ) immediate release tablet 5 mg  5 mg Oral Q4H PRN Johnson, Clanford L, MD       pantoprazole  (PROTONIX ) injection 40 mg  40 mg Intravenous BID Johnson, Clanford L, MD   40 mg at  10/01/24 1332   sucralfate (CARAFATE) 1 GM/10ML suspension 1 g  1 g Oral TID WC & HS Johnson, Clanford L, MD       tamsulosin  (FLOMAX ) capsule 0.4 mg  0.4 mg Oral QPC supper Johnson, Clanford L, MD       thiamine  (VITAMIN B1) tablet 100 mg  100 mg Oral Daily Johnson, Clanford L, MD   100 mg at 10/01/24 1332   Or   thiamine  (VITAMIN B1) injection 100 mg  100 mg Intravenous Daily Johnson, Clanford L, MD       traZODone (DESYREL) tablet 50 mg  50 mg Oral QHS PRN Vicci, Clanford L, MD        Allergies as of 10/01/2024   (No Known Allergies)    Family History  Problem Relation Age of Onset   Asthma Mother    Diabetes Father    Heart attack Father 52   Leukemia Brother    Esophageal cancer Paternal Uncle    Lung disease Other        pat cousin   Heart attack Other        pat cousin   Colon cancer Neg Hx    Stomach cancer Neg Hx     Social History   Socioeconomic History   Marital status: Married    Spouse name: Madeline   Number of children: 5   Years of education: 10   Highest education level: 10th grade  Occupational History   Occupation: retired Naval architect  Tobacco Use   Smoking status: Every Day    Current packs/day: 0.00    Average packs/day: 0.3 packs/day for 40.0 years (10.0 ttl pk-yrs)    Types: Cigarettes    Start date: 01/15/1977  Last attempt to quit: 01/15/2017    Years since quitting: 7.7   Smokeless tobacco: Current    Types: Chew   Tobacco comments:    Smokes half to one pack per day for about 45 years - now also smokes e-cigarette sometimes and chews tobacco.  Updated 12/01/2023.  Vaping Use   Vaping status: Former   Substances: Nicotine    Devices: unsure  Substance and Sexual Activity   Alcohol use: Yes    Alcohol/week: 24.0 standard drinks of alcohol    Types: 24 Cans of beer per week    Comment: pt drinks 3-4 beers a day   Drug use: Never   Sexual activity: Not Currently  Other Topics Concern   Not on file  Social History Narrative   Lives  at home with wife.  Grown children.  Retired IT trainer. Children live nearby.    Social Drivers of Corporate investment banker Strain: Low Risk  (08/22/2024)   Overall Financial Resource Strain (CARDIA)    Difficulty of Paying Living Expenses: Not hard at all  Food Insecurity: No Food Insecurity (10/01/2024)   Hunger Vital Sign    Worried About Running Out of Food in the Last Year: Never true    Ran Out of Food in the Last Year: Never true  Transportation Needs: No Transportation Needs (10/01/2024)   PRAPARE - Administrator, Civil Service (Medical): No    Lack of Transportation (Non-Medical): No  Physical Activity: Inactive (08/22/2024)   Exercise Vital Sign    Days of Exercise per Week: 0 days    Minutes of Exercise per Session: 0 min  Stress: No Stress Concern Present (08/22/2024)   [employer] of Occupational Health - Occupational Stress Questionnaire    Feeling of Stress: Not at all  Social Connections: Moderately Isolated (10/01/2024)   Social Connection and Isolation Panel    Frequency of Communication with Friends and Family: More than three times a week    Frequency of Social Gatherings with Friends and Family: Once a week    Attends Religious Services: Never    Database administrator or Organizations: No    Attends Banker Meetings: Never    Marital Status: Married  Catering manager Violence: Not At Risk (10/01/2024)   Humiliation, Afraid, Rape, and Kick questionnaire    Fear of Current or Ex-Partner: No    Emotionally Abused: No    Physically Abused: No    Sexually Abused: No     Review of Systems   Gen: Denies any fever, chills, loss of appetite, change in weight or weight loss CV: + chest pain Denies heart palpitations, syncope, edema  Resp: + dyspnea at rest and exertion. + cough. Denies wheezing, coughing up blood, and pleurisy. GI: see HPI. Derm: Denies rash, itching, dry skin, hives. Psych: Denies depression, anxiety, memory loss,  hallucinations, and confusion. Heme: + rectal bleeding. Denies bruising Neuro:  Denies any headaches, dizziness, paresthesias, shaking  Physical Exam   Vital Signs in last 24 hours: Temp:  [97.5 F (36.4 C)-101.8 F (38.8 C)] 97.5 F (36.4 C) (10/14 1309) Pulse Rate:  [101-146] 111 (10/14 1309) Resp:  [19-31] 20 (10/14 1309) BP: (103-128)/(58-101) 115/76 (10/14 1309) SpO2:  [90 %-100 %] 100 % (10/14 1309) Weight:  [111.6 kg-112 kg] 111.6 kg (10/14 1309)    General:   Drowsy,  pleasant and cooperative. Chronically ill appearing. Head:  Normocephalic and atraumatic. Eyes:  Sclera clear, no icterus.   Conjunctiva pink.  Ears:  Normal auditory acuity. Neck:  Supple; no masses Lungs:  diminished throughout with mild rhonchi to upper left lobe.  No acute distress. Heart:  Regular rate and rhythm; no murmurs, clicks, rubs,  or gallops. Abdomen:  Soft, nontender and nondistended. No masses, hepatosplenomegaly or hernias noted. Normal bowel sounds, without guarding, and without rebound.   Rectal: deferred Msk:  Symmetrical without gross deformities. Normal posture. Extremities:  Without clubbing or edema. Neurologic:  Alert and  oriented x4. Skin:  Intact without significant lesions or rashes. Psych:  Alert and cooperative. Normal mood and affect.  Intake/Output from previous day: No intake/output data recorded. Intake/Output this shift: Total I/O In: 1201.4 [IV Piggyback:1201.4] Out: -   Last Weight  Most recent update: 10/01/2024  1:20 PM    Weight  111.6 kg (246 lb 0.5 oz)            Wt Readings from Last 3 Encounters:  10/01/24 111.6 kg  09/17/24 114.3 kg  09/11/24 117.1 kg    Labs/Studies   Recent Labs Recent Labs    10/01/24 0822  WBC 9.6  HGB 11.1*  HCT 32.8*  PLT 264   BMET Recent Labs    10/01/24 0822  NA 132*  K 3.9  CL 93*  CO2 29  GLUCOSE 126*  BUN 15  CREATININE 1.13  CALCIUM  9.4   LFT Recent Labs    10/01/24 0822  PROT 7.8   ALBUMIN 3.4*  AST 30  ALT 25  ALKPHOS 86  BILITOT 0.7   PT/INR Recent Labs    10/01/24 0822  LABPROT 18.0*  INR 1.4*   Hepatitis Panel No results for input(s): HEPBSAG, HCVAB, HEPAIGM, HEPBIGM in the last 72 hours. C-Diff No results for input(s): CDIFFTOX in the last 72 hours.  Radiology/Studies CT CHEST ABDOMEN PELVIS W CONTRAST Result Date: 10/01/2024 CLINICAL DATA:  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 Completed radiation therapy and chemotherapy 04/15/2024 on maintenance pembrolizumab  immunotherapy. Sepsis with chest and abdominal pain with eating/drinking. * Tracking Code: BO * EXAM: CT CHEST, ABDOMEN, AND PELVIS WITH CONTRAST TECHNIQUE: Multidetector CT imaging of the chest, abdomen and pelvis was performed following the standard protocol during bolus administration of intravenous contrast. RADIATION DOSE REDUCTION: This exam was performed according to the departmental dose-optimization program which includes automated exposure control, adjustment of the mA and/or kV according to patient size and/or use of iterative reconstruction technique. CONTRAST:  OMNIPAQUE  IOHEXOL  300 MG/ML  SOLN COMPARISON:  03/31/2024 FINDINGS: CT CHEST FINDINGS Cardiovascular: Left Port-A-Cath tip: SVC. Coronary, aortic arch, and branch vessel atherosclerotic vascular disease. Small anterior pericardial effusion, new compared to 03/31/2024. Mediastinum/Nodes: Subtle stranding in the mediastinum particularly around the trachea and tracheoesophageal groove. Small hilar, subcarinal, and paratracheal lymph nodes are not pathologically enlarged by size criteria. Lungs/Pleura: The right upper lobe pulmonary nodule measures 2.4 by 1.2 cm on image 30 series 3, formerly 3.3 by 1.8 cm on 03/31/2024. Previous cavitary component no longer present. Bandlike density extending around the nodule and below the nodule in the right upper lobe is likely attributable to  radiation pneumonitis. Right upper lobe peribronchovascular volume loss with substantial bilateral airway thickening worsened from previous, with some airway plugging particularly in the left lower lobe. The airway thickening causes luminal narrowing. Musculoskeletal: Mild thoracic spondylosis. CT ABDOMEN PELVIS FINDINGS Hepatobiliary: Faint transient hepatic attenuation difference in segment 4 of the liver without definite underlying lesion identified. Gallbladder surgically absent. No biliary dilatation. Pancreas:  Unremarkable Spleen: Unremarkable Adrenals/Urinary Tract: Unremarkable Stomach/Bowel: There are few scattered sigmoid colon diverticula. No dilated bowel. No specific gastric or duodenal abnormality observed. Formed stool noted in the colon. Vascular/Lymphatic: Atherosclerosis is present, including aortoiliac atherosclerotic disease. Atheromatous plaque noted proximally in the celiac trunk and SMA but not causing occlusion or high-grade stenosis. Reproductive: Unremarkable Other: No supplemental non-categorized findings. Musculoskeletal: Lumbar spondylosis and degenerative disc disease, causing bilateral moderate foraminal impingement at L5-S1. IMPRESSION: 1. The right upper lobe pulmonary nodule is smaller than on the prior exam, currently 2.4 by 1.2 cm, formerly 3.3 by 1.8 cm. Previous cavitary component no longer present. 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 2. Tracheal and bilateral airway thickening with some airway plugging particularly in the left lower lobe. Infectious etiology favored given the diffuse pattern, a component of reactive airways disease is not excluded. 3. Small anterior pericardial effusion, new compared to 03/31/2024. 4. Subtle stranding in the mediastinum particularly around the trachea and tracheoesophageal groove, likely inflammatory. 5. Lumbar spondylosis and degenerative disc disease causing bilateral moderate foraminal impingement at L5-S1. 6. Aortic  Atherosclerosis (ICD10-I70.0). Systemic and coronary atherosclerosis. Electronically Signed   By: Ryan Salvage M.D.   On: 10/01/2024 10:56   DG Chest Portable 1 View Result Date: 10/01/2024 EXAM: 1 VIEW(S) XRAY OF THE CHEST 10/01/2024 08:05:00 AM COMPARISON: Prior study. CLINICAL HISTORY: cp. Per triage; Pt in by POV for abd pain / burning. Pt states this is his 3rd ER visit in the last 2 weeks for this pain. Pt can not eat or drink without a fire burning sensation in his chest and abdomen. Pt has had this ongoing since his cancer radiation in April. Pt ; can not get in to see GI until November. FINDINGS: LINES, TUBES AND DEVICES: Left IJ port catheter to the SVC. LUNGS AND PLEURA: Improved aeration in the lung bases since prior study. No focal pulmonary opacity. No pulmonary edema. No pleural effusion. No pneumothorax. HEART AND MEDIASTINUM: Aortic atherosclerosis. No acute abnormality of the cardiac and mediastinal silhouettes. BONES AND SOFT TISSUES: No acute osseous abnormality. IMPRESSION: 1. No acute cardiopulmonary process. 2. Improved aeration in the lung bases. Electronically signed by: Katheleen Faes MD 10/01/2024 08:09 AM EDT RP Workstation: [id number]    Assessment   Toa Mia Stiverson is a 73 y.o. 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 GI consulted for further evaluation and management of potential esophagitis.  Chest pain, reflux, concern for esophagitis: Recent history of radiation and chemo for lung cancer, completed April 2025. Frequent regular alcohol use and current daily 1 ppd smoker Recent antibiotics 2 weeks ago and regular steroid inhaler use without adequate mouth hygiene. Worsening burning in the chest and throat and epigastrium  returned a month ago and becoming progressively worse. Had initial trouble toward end of radiation. Has only been maintained on once daily PPI for a few weeks and only temporary relief from GI cocktail (xylocaine  and Maalox) given by PCP.   Differentials include radiation esophagitis, candida esophagitis, esophagitis secondary to EtOH use, reflux esophagitis. Is in need of EGD for further evaluation however is on Eliquis for left jugular clot therefore needs to be held for 48 hours for washout therefore will hold on EGD until Thursday 10/16 pending improvement from respiratory standpoint.  Pneumonia: Recent  course of steroids and antibiotics (clindamycin?) for suspected COPD flare. Currently on 2L [place] with stable O2 sats, baseline at home is 94% on room air. Imaging this admission with concern for pneumonia. Congested cough present. Continues to smoke. Management with antibiotics per attending.    Plan / Recommendations   Carafate 1g QID PPI IV BID Hold Eliquis Full liquid diet EGD after Eliquis washout (tentatively 10/16) Outpatient surveillance colonoscopy    10/01/2024, 2:06 PM  [name] Melia, MSN, FNP-BC, AGACNP-BC [facility]

## 2024-10-01 NOTE — Hospital Course (Addendum)
 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  He has been suffering with issues of severe GERD and esophagitis symptoms since radiation therapy and reportedly in the past 3 days has not been able to eat or drink.  He is an regular daily beer drinker and has not been able to drink beer in the last 3 days.  He has been trying to follow an anti-reflux diet but nothing seems to help his GERD symptoms.  He was prescribed a xylocaine  magic mouthwash by his PCP but when he takes this the symptoms are only relieved for about 45 mins.  He had been referred to GI by his oncologist but the appointment is not until November 2025.  He presented to the emergency department with abdominal pain and burning in the chest and abdomen.  He says that it feels like fire.  He has been on PPI therapy but not improving.  He presented with sepsis physiology with a fever of 101.5 and tachycardia.  His infection workup positive for left lower lobe pneumonia as a likely cause of sepsis.  Right upper lobe pulmonary nodule smaller than on prior examinations.  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  Patient is being admitted for further management.

## 2024-10-01 NOTE — ED Triage Notes (Signed)
 Pt in by POV for abd pain / burning. Pt states this is his 3rd ER visit in the last 2 weeks for this pain. Pt can not eat or drink without a fire burning sensation in his chest and abdomen. Pt has had this ongoing since his cancer radiation in April. Pt can not get in to see GI until November.

## 2024-10-01 NOTE — Progress Notes (Signed)
 Elink is following code sepsis.

## 2024-10-01 NOTE — Telephone Encounter (Signed)
 Duplicate

## 2024-10-01 NOTE — H&P (Addendum)
 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  He has been suffering with issues of severe GERD and esophagitis symptoms since radiation therapy and reportedly in the past 3 days has not been able to eat or drink.  He is an regular daily beer drinker and has not been able to drink beer in the last 3 days.  He has been trying to follow an anti-reflux diet but nothing seems to help his GERD symptoms.  He was prescribed a xylocaine  magic mouthwash by his PCP but when he takes this the symptoms are only relieved for about 45 mins.  He had been referred to GI by his oncologist but the appointment is not until November 2025.  He presented to the emergency department with abdominal pain and burning in the chest and abdomen.  He says that it feels like fire.  He has been on PPI therapy but not improving.  He presented with sepsis physiology with a fever of 101.5 and tachycardia.  His infection workup positive for left lower lobe pneumonia as a likely cause of sepsis.  Right upper lobe pulmonary nodule smaller than on prior examinations.  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  Patient is being admitted for further management.   Past Medical History:  Diagnosis Date   Arthritis    BPH associated with nocturia    Cancer (HCC)    stage III non-small cell lung cancer.   Chronic gout    COPD (chronic obstructive pulmonary disease) (HCC)    not on home o2    History of colon polyps    History of COVID-19    04/ 2019 hospital admission in epic, severe sepsis due to covid w/ acute respiratory failure;   and 05/ 2021 mild to moderate symptoms, recovered at home   Hyperlipidemia, mixed    Hypertension    Phimosis    Type 2 diabetes mellitus (HCC)    followed by pcp   (05-17-2022  pt stated checks sugar multiple times daily w/ Libre,  fasting average --- 100-110)    Past Surgical History:  Procedure Laterality Date   BRONCHIAL BIOPSY  01/22/2024   Procedure: BRONCHIAL BIOPSIES;  Surgeon: Shelah Lamar RAMAN, MD;  Location: [facility] ENDOSCOPY;  Service: Pulmonary;;   BRONCHIAL BRUSHINGS  01/22/2024   Procedure: BRONCHIAL BRUSHINGS;  Surgeon: Shelah Lamar RAMAN, MD;  Location: [facility] ENDOSCOPY;  Service: Pulmonary;;   BRONCHIAL NEEDLE ASPIRATION BIOPSY  01/22/2024   Procedure: BRONCHIAL NEEDLE ASPIRATION BIOPSIES;  Surgeon: Shelah Lamar RAMAN, MD;  Location: [facility] ENDOSCOPY;  Service: Pulmonary;;   CIRCUMCISION N/A 05/19/2022   Procedure: CIRCUMCISION ADULT;  Surgeon: Matilda Senior, MD;  Location: [facility];  Service: Urology;  Laterality: N/A;  45 MINUTES   COLONOSCOPY  12/01/2021   by dr legrand   ENDOBRONCHIAL ULTRASOUND  01/22/2024   Procedure: ENDOBRONCHIAL ULTRASOUND;  Surgeon: Shelah Lamar RAMAN, MD;  Location: [facility] ENDOSCOPY;  Service: Pulmonary;;   FINE NEEDLE ASPIRATION  01/22/2024  Procedure: FINE NEEDLE ASPIRATION (FNA) LINEAR;  Surgeon: Shelah Lamar RAMAN, MD;  Location: [facility] ENDOSCOPY;  Service: Pulmonary;;   LAPAROSCOPIC CHOLECYSTECTOMY  2008   in [place]   LEFT HEART CATH     TONSILLECTOMY     child   VIDEO BRONCHOSCOPY WITH RADIAL ENDOBRONCHIAL ULTRASOUND  01/22/2024   Procedure: VIDEO BRONCHOSCOPY WITH RADIAL ENDOBRONCHIAL ULTRASOUND;  Surgeon: Shelah Lamar RAMAN, MD;  Location: [facility] ENDOSCOPY;  Service: Pulmonary;;     reports that he has been smoking cigarettes. He started smoking about 47 years ago. He has a 10 pack-year smoking history. His  smokeless tobacco use includes chew. He reports current alcohol use of about 24.0 standard drinks of alcohol per week. He reports that he does not use drugs.  No Known Allergies  Family History  Problem Relation Age of Onset   Asthma Mother    Diabetes Father    Heart attack Father 34   Leukemia Brother    Esophageal cancer Paternal Uncle    Lung disease Other        pat cousin   Heart attack Other        pat cousin   Colon cancer Neg Hx    Stomach cancer Neg Hx     Prior to Admission medications   Medication Sig Start Date End Date Taking? Authorizing Provider  acetaminophen  (TYLENOL ) 500 MG tablet Take 1,000 mg by mouth 2 (two) times daily as needed for moderate pain (pain score 4-6), fever or headache.   Yes [provider]  albuterol  (VENTOLIN  HFA) 108 (90 Base) MCG/ACT inhaler INHALE 2 PUFFS INTO THE LUNGS TWICE A DAY 03/04/24  Yes Stacks, Butler, MD  allopurinol  (ZYLOPRIM ) 100 MG tablet TAKE 1 TABLET (100 MG TOTAL) BY MOUTH DAILY. TO PREVENT GOUT 05/07/24  Yes Zollie Butler, MD  Apixaban Starter Pack, 10mg  and 5mg , (ELIQUIS DVT/PE STARTER PACK) Take 5 mg by mouth 2 (two) times daily. 05/08/24 10/01/24 Yes [provider]  budesonide -formoterol  (SYMBICORT ) 160-4.5 MCG/ACT inhaler 2 puffs first thing in am and then another 2 puffs about 12 hours later. 02/14/24  Yes Darlean Ozell NOVAK, MD  Continuous Glucose Sensor (FREESTYLE LIBRE 2 SENSOR) MISC USE TO TEST BLOOD SUGAR CONTINUOUSLY. Dx. E11.65 SEND TO [facility] FOR MEDICARE COVERAGE, DO NOT SEND TO LOCAL PHARMACY 08/29/24  Yes Stacks, Butler, MD  cyanocobalamin  (VITAMIN B12) 1000 MCG tablet Take 1 tablet (1,000 mcg total) by mouth daily. 09/12/24  Yes Joesph Annabella HERO, FNP  folic acid  (FOLATE) 400 MCG tablet Take 400 mcg by mouth daily.   Yes [provider]  ipratropium-albuterol  (DUONEB) 0.5-2.5 (3) MG/3ML SOLN Take 3 mLs by nebulization every 6 (six) hours as needed. 09/17/24  Yes [facility] Morton Hummer,  Nena, NP  Lactobacillus (PROBIOTIC ACIDOPHILUS PO) Take 1 capsule by mouth daily.   Yes [provider]  lidocaine  (XYLOCAINE ) 2 % solution Use as directed 15 mLs in the mouth or throat. 09/26/24  Yes [provider]  metFORMIN  (GLUCOPHAGE -XR) 750 MG 24 hr tablet Take 1 tablet (750 mg total) by mouth daily with breakfast. 08/07/24  Yes Zollie Butler, MD  pantoprazole  (PROTONIX ) 40 MG tablet Take 1 tablet (40 mg total) by mouth daily. For stomach 08/07/24  Yes Stacks, Butler, MD  potassium chloride  SA (KLOR-CON  M20) 20 MEQ tablet TAKE 1 TABLET (20 MEQ TOTAL) DAILY BY MOUTH. Patient taking differently: Take 40 mEq by mouth 2 (two) times daily. 08/07/24  Yes Zollie Butler, MD  tamsulosin  (FLOMAX ) 0.4 MG CAPS capsule  TAKE 2 CAPSULES BY MOUTH AT BEDTIME FOR URINE FLOW AND PROSTATE 03/28/24  Yes Stacks, Butler, MD  torsemide  (DEMADEX ) 100 MG tablet Take 100 mg by mouth 2 (two) times daily.   Yes [provider]    Physical Exam: Vitals:   10/01/24 0931 10/01/24 0937 10/01/24 1136 10/01/24 1309  BP:   (!) 122/101 115/76  Pulse:   (!) 101 (!) 111  Resp:    20  Temp: 98.7 F (37.1 C)   (!) 97.5 F (36.4 C)  TempSrc: Oral   Oral  SpO2:  95% 97% 100%  Weight:      Height:        Constitutional: pt appears ill, he is uncomfortable but NAD.  Eyes: PERRL, lids and conjunctivae normal ENMT: Mucous membranes are moist. Posterior pharynx clear of any exudate or lesions.Normal dentition.  Neck: normal, supple, no masses, no thyromegaly Respiratory: clear to auscultation bilaterally, no wheezing, no crackles. Normal respiratory effort. No accessory muscle use.  Cardiovascular: tachycardic rate, normal s1, s2 sounds, no murmurs / rubs / gallops. No extremity edema. 2+ pedal pulses. No carotid bruits.  Abdomen: no tenderness, no masses palpated. No hepatosplenomegaly. Bowel sounds positive.  Musculoskeletal: no clubbing / cyanosis. No joint deformity upper and lower extremities.  Good ROM, no contractures. Normal muscle tone.  Skin: no rashes, lesions, ulcers. No induration Neurologic: CN 2-12 grossly intact. Sensation intact, DTR normal. Strength 5/5 in all 4.  Psychiatric: Normal judgment and insight. Alert and oriented x 3. Normal mood.   Labs on Admission: I have personally reviewed following labs and imaging studies  CBC: Recent Labs  Lab 10/01/24 0822  WBC 9.6  NEUTROABS 8.2*  HGB 11.1*  HCT 32.8*  MCV 100.0  PLT 264   Basic Metabolic Panel: Recent Labs  Lab 10/01/24 0822  NA 132*  K 3.9  CL 93*  CO2 29  GLUCOSE 126*  BUN 15  CREATININE 1.13  CALCIUM  9.4   GFR: Estimated Creatinine Clearance: 75.1 mL/min (by C-G formula based on SCr of 1.13 mg/dL). Liver Function Tests: Recent Labs  Lab 10/01/24 0822  AST 30  ALT 25  ALKPHOS 86  BILITOT 0.7  PROT 7.8  ALBUMIN 3.4*   Recent Labs  Lab 10/01/24 0822  LIPASE 40   Recent Labs  Lab 10/01/24 0822  AMMONIA 39*   Coagulation Profile: Recent Labs  Lab 10/01/24 0822  INR 1.4*   Cardiac Enzymes: No results for input(s): CKTOTAL, CKMB, CKMBINDEX, TROPONINI in the last 168 hours. BNP (last 3 results) No results for input(s): PROBNP in the last 8760 hours. HbA1C: No results for input(s): HGBA1C in the last 72 hours. CBG: No results for input(s): GLUCAP in the last 168 hours. Lipid Profile: No results for input(s): CHOL, HDL, LDLCALC, TRIG, CHOLHDL, LDLDIRECT in the last 72 hours. Thyroid  Function Tests: No results for input(s): TSH, T4TOTAL, FREET4, T3FREE, THYROIDAB in the last 72 hours. Anemia Panel: No results for input(s): VITAMINB12, FOLATE, FERRITIN, TIBC, IRON, RETICCTPCT in the last 72 hours. Urine analysis:    Component Value Date/Time   COLORURINE YELLOW 10/01/2024 1127   APPEARANCEUR CLEAR 10/01/2024 1127   APPEARANCEUR Clear 05/12/2021 0912   LABSPEC 1.024 10/01/2024 1127   PHURINE 6.0 10/01/2024 1127    GLUCOSEU NEGATIVE 10/01/2024 1127   HGBUR NEGATIVE 10/01/2024 1127   BILIRUBINUR NEGATIVE 10/01/2024 1127   BILIRUBINUR Negative 05/12/2021 0912   KETONESUR NEGATIVE 10/01/2024 1127   PROTEINUR NEGATIVE 10/01/2024 1127   NITRITE NEGATIVE 10/01/2024 1127  LEUKOCYTESUR NEGATIVE 10/01/2024 1127    Radiological Exams on Admission: CT CHEST ABDOMEN PELVIS W CONTRAST Result Date: 10/01/2024 CLINICAL DATA:  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 Completed radiation therapy and chemotherapy 04/15/2024 on maintenance pembrolizumab  immunotherapy. Sepsis with chest and abdominal pain with eating/drinking. * Tracking Code: BO * EXAM: CT CHEST, ABDOMEN, AND PELVIS WITH CONTRAST TECHNIQUE: Multidetector CT imaging of the chest, abdomen and pelvis was performed following the standard protocol during bolus administration of intravenous contrast. RADIATION DOSE REDUCTION: This exam was performed according to the departmental dose-optimization program which includes automated exposure control, adjustment of the mA and/or kV according to patient size and/or use of iterative reconstruction technique. CONTRAST:  OMNIPAQUE  IOHEXOL  300 MG/ML  SOLN COMPARISON:  03/31/2024 FINDINGS: CT CHEST FINDINGS Cardiovascular: Left Port-A-Cath tip: SVC. Coronary, aortic arch, and branch vessel atherosclerotic vascular disease. Small anterior pericardial effusion, new compared to 03/31/2024. Mediastinum/Nodes: Subtle stranding in the mediastinum particularly around the trachea and tracheoesophageal groove. Small hilar, subcarinal, and paratracheal lymph nodes are not pathologically enlarged by size criteria. Lungs/Pleura: The right upper lobe pulmonary nodule measures 2.4 by 1.2 cm on image 30 series 3, formerly 3.3 by 1.8 cm on 03/31/2024. Previous cavitary component no longer present. Bandlike density extending around the nodule and below the nodule in the right upper lobe is likely  attributable to radiation pneumonitis. Right upper lobe peribronchovascular volume loss with substantial bilateral airway thickening worsened from previous, with some airway plugging particularly in the left lower lobe. The airway thickening causes luminal narrowing. Musculoskeletal: Mild thoracic spondylosis. CT ABDOMEN PELVIS FINDINGS Hepatobiliary: Faint transient hepatic attenuation difference in segment 4 of the liver without definite underlying lesion identified. Gallbladder surgically absent. No biliary dilatation. Pancreas: Unremarkable Spleen: Unremarkable Adrenals/Urinary Tract: Unremarkable Stomach/Bowel: There are few scattered sigmoid colon diverticula. No dilated bowel. No specific gastric or duodenal abnormality observed. Formed stool noted in the colon. Vascular/Lymphatic: Atherosclerosis is present, including aortoiliac atherosclerotic disease. Atheromatous plaque noted proximally in the celiac trunk and SMA but not causing occlusion or high-grade stenosis. Reproductive: Unremarkable Other: No supplemental non-categorized findings. Musculoskeletal: Lumbar spondylosis and degenerative disc disease, causing bilateral moderate foraminal impingement at L5-S1. IMPRESSION: 1. The right upper lobe pulmonary nodule is smaller than on the prior exam, currently 2.4 by 1.2 cm, formerly 3.3 by 1.8 cm. Previous cavitary component no longer present. 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 2. Tracheal and bilateral airway thickening with some airway plugging particularly in the left lower lobe. Infectious etiology favored given the diffuse pattern, a component of reactive airways disease is not excluded. 3. Small anterior pericardial effusion, new compared to 03/31/2024. 4. Subtle stranding in the mediastinum particularly around the trachea and tracheoesophageal groove, likely inflammatory. 5. Lumbar spondylosis and degenerative disc disease causing bilateral moderate foraminal impingement at L5-S1. 6.  Aortic Atherosclerosis (ICD10-I70.0). Systemic and coronary atherosclerosis. Electronically Signed   By: Ryan Salvage M.D.   On: 10/01/2024 10:56   DG Chest Portable 1 View Result Date: 10/01/2024 EXAM: 1 VIEW(S) XRAY OF THE CHEST 10/01/2024 08:05:00 AM COMPARISON: Prior study. CLINICAL HISTORY: cp. Per triage; Pt in by POV for abd pain / burning. Pt states this is his 3rd ER visit in the last 2 weeks for this pain. Pt can not eat or drink without a fire burning sensation in his chest and abdomen. Pt has had this ongoing since his cancer radiation in April. Pt ; can not get in to see GI until November. FINDINGS:  LINES, TUBES AND DEVICES: Left IJ port catheter to the SVC. LUNGS AND PLEURA: Improved aeration in the lung bases since prior study. No focal pulmonary opacity. No pulmonary edema. No pleural effusion. No pneumothorax. HEART AND MEDIASTINUM: Aortic atherosclerosis. No acute abnormality of the cardiac and mediastinal silhouettes. BONES AND SOFT TISSUES: No acute osseous abnormality. IMPRESSION: 1. No acute cardiopulmonary process. 2. Improved aeration in the lung bases. Electronically signed by: Katheleen Faes MD 10/01/2024 08:09 AM EDT RP Workstation: [id number]   EKG: Independently reviewed. Sinus tach   Assessment/Plan Principal Problem:   Sepsis due to pneumonia [facility]) Active Problems:   Chronic alcohol abuse   Odynophagia   COPD GOLD 2   Mixed hyperlipidemia   Malignant neoplasm of upper lobe of right lung (HCC)   Dehydration   Weakness generalized   Type 2 diabetes mellitus with diabetic neuropathy, with long-term current use of insulin  (HCC)   Tobacco dependence   Radiation esophagitis   Hyponatremia   FTT (failure to thrive) in adult   Sepsis secondary to pneumonia -- continue IV fluid, IV antibiotics and supportive care -- no lactic acidosis today  RLL pneumonia  -- pt is immunocompromised -- agree with broad spectrum IV antibiotics for coverage -- supportive  measures -- bronchodilators and mucolytics ordered -- flutter valve -- wean supplemental oxygen  as able   Type 2 DM controlled -- sensitive SSI coverage due to poor nutrition consumption  Severe GERD Severe Esophagitis  Question of radiation esophagitis -- symptoms have persisted since completion of radiation therapy 04/15/24 -- symptoms in fact have worsened after all this time -- worrisome for another underlying cause for these symptoms -- it is affecting his ability to eat and drink now -- I will ask for an inpatient GI consultation to escalate diagnosis and treatment -- I could not find any evidence of yeast in the mouth but worry about esophageal candidiasis -- for now IV pantoprazole  40 mg BIDI ordered -- added carafate treatment  -- pt can only tolerate liquids, full liquid diet ordered for now -- further recommendation pending GI evaluation   Stage IIIb RUL bronchogenic carcinoma -- pt has completed chemo and radiation therapy -- pt is now on immunotherapy with pembrolizumab   Chronic alcohol use -- reports drinks 2-3 beers 4 days / week  -- no history of DTs or acute alcohol withdrawal -- CIWA protocol ordered for PRN lorazepam  and vitamins  Tobacco -- nicotine  patch ordered   Adult FTT -- wife planning to bring living will for us  to review -- pt undecided about code status, readdress with him later  Hyponatremia -- continue isotonic IV fluid for now -- temporarily holding home torsemide   History of DVT -- resume home apixaban   DVT prophylaxis: apixaban   Code Status: Full   Family Communication: wife at bedside   Disposition Plan: anticipating home   Consults called: GI   Admission status: INP Time spent: 70 mins  Level of care: Med-Surg Afton Louder MD [facility] How to contact the [facility] Attending or Consulting provider 7A - 7P or covering provider during after hours 7P -7A, for this patient?  Check the care team in [facility] and look for a)  attending/consulting [facility] provider listed and b) the [facility] team listed Log into [email or web address] and use [place] universal password to access. If you do not have the password, please contact the hospital operator. Locate the [facility] provider you are looking for under [facility] and page to a number that you can be  directly reached. If you still have difficulty reaching the provider, please page the [facility] (Director on Call) for the Hospitalists listed on amion for assistance.   If 7PM-7AM, please contact night-coverage [email or web address] Password [email or web address]  10/01/2024, 1:13 PM

## 2024-10-02 DIAGNOSIS — A419 Sepsis, unspecified organism: Secondary | ICD-10-CM | POA: Diagnosis not present

## 2024-10-02 DIAGNOSIS — R1310 Dysphagia, unspecified: Secondary | ICD-10-CM

## 2024-10-02 DIAGNOSIS — R079 Chest pain, unspecified: Secondary | ICD-10-CM | POA: Diagnosis not present

## 2024-10-02 DIAGNOSIS — J189 Pneumonia, unspecified organism: Secondary | ICD-10-CM | POA: Diagnosis not present

## 2024-10-02 LAB — CBC WITH DIFFERENTIAL/PLATELET
Abs Immature Granulocytes: 0.04 K/uL (ref 0.00–0.07)
Basophils Absolute: 0 K/uL (ref 0.0–0.1)
Basophils Relative: 0 %
Eosinophils Absolute: 0 K/uL (ref 0.0–0.5)
Eosinophils Relative: 0 %
HCT: 31.8 % — ABNORMAL LOW (ref 39.0–52.0)
Hemoglobin: 10.6 g/dL — ABNORMAL LOW (ref 13.0–17.0)
Immature Granulocytes: 1 %
Lymphocytes Relative: 8 %
Lymphs Abs: 0.7 K/uL (ref 0.7–4.0)
MCH: 33.4 pg (ref 26.0–34.0)
MCHC: 33.3 g/dL (ref 30.0–36.0)
MCV: 100.3 fL — ABNORMAL HIGH (ref 80.0–100.0)
Monocytes Absolute: 0.7 K/uL (ref 0.1–1.0)
Monocytes Relative: 9 %
Neutro Abs: 7.1 K/uL (ref 1.7–7.7)
Neutrophils Relative %: 82 %
Platelets: 262 K/uL (ref 150–400)
RBC: 3.17 MIL/uL — ABNORMAL LOW (ref 4.22–5.81)
RDW: 12.6 % (ref 11.5–15.5)
WBC: 8.6 K/uL (ref 4.0–10.5)
nRBC: 0 % (ref 0.0–0.2)

## 2024-10-02 LAB — COMPREHENSIVE METABOLIC PANEL WITH GFR
ALT: 24 U/L (ref 0–44)
AST: 23 U/L (ref 15–41)
Albumin: 3.1 g/dL — ABNORMAL LOW (ref 3.5–5.0)
Alkaline Phosphatase: 82 U/L (ref 38–126)
Anion gap: 11 (ref 5–15)
BUN: 12 mg/dL (ref 8–23)
CO2: 26 mmol/L (ref 22–32)
Calcium: 8.9 mg/dL (ref 8.9–10.3)
Chloride: 97 mmol/L — ABNORMAL LOW (ref 98–111)
Creatinine, Ser: 0.94 mg/dL (ref 0.61–1.24)
GFR, Estimated: >60 mL/min (ref >60)
Glucose, Bld: 104 mg/dL — ABNORMAL HIGH (ref 70–99)
Potassium: 3.4 mmol/L — ABNORMAL LOW (ref 3.5–5.1)
Sodium: 133 mmol/L — ABNORMAL LOW (ref 135–145)
Total Bilirubin: 0.5 mg/dL (ref 0.0–1.2)
Total Protein: 7.2 g/dL (ref 6.5–8.1)

## 2024-10-02 LAB — GLUCOSE, CAPILLARY
Glucose-Capillary: 114 mg/dL — ABNORMAL HIGH (ref 70–99)
Glucose-Capillary: 105 mg/dL — ABNORMAL HIGH (ref 70–99)
Glucose-Capillary: 121 mg/dL — ABNORMAL HIGH (ref 70–99)

## 2024-10-02 LAB — PHOSPHORUS: Phosphorus: 3.1 mg/dL (ref 2.5–4.6)

## 2024-10-02 LAB — MAGNESIUM: Magnesium: 2.7 mg/dL — ABNORMAL HIGH (ref 1.7–2.4)

## 2024-10-02 MED ORDER — SALINE SPRAY 0.65 % NA SOLN
1.0000 | NASAL | Status: DC | PRN
  Filled 2024-10-02: qty 44

## 2024-10-02 MED ORDER — IPRATROPIUM-ALBUTEROL 0.5-2.5 (3) MG/3ML IN SOLN
3.0000 mL | Freq: Four times a day (QID) | RESPIRATORY_TRACT | Status: DC
  Administered 2024-10-03 (×4): 3 mL via RESPIRATORY_TRACT
  Filled 2024-10-02 (×4): qty 3

## 2024-10-02 MED ORDER — METHYLPREDNISOLONE SODIUM SUCC 125 MG IJ SOLR
125.0000 mg | Freq: Once | INTRAMUSCULAR | Status: AC
  Administered 2024-10-02: 125 mg via INTRAVENOUS
  Filled 2024-10-02: qty 2

## 2024-10-02 MED ORDER — GUAIFENESIN-DM 100-10 MG/5ML PO SYRP
5.0000 mL | ORAL_SOLUTION | ORAL | Status: DC | PRN
  Administered 2024-10-02: 5 mL via ORAL
  Filled 2024-10-02: qty 5

## 2024-10-02 MED ORDER — METHYLPREDNISOLONE SODIUM SUCC 40 MG IJ SOLR
40.0000 mg | Freq: Two times a day (BID) | INTRAMUSCULAR | Status: DC
  Administered 2024-10-03 – 2024-10-04 (×3): 40 mg via INTRAVENOUS
  Filled 2024-10-02 (×3): qty 1

## 2024-10-02 NOTE — Progress Notes (Signed)
 Subjective: Still with some continued burning in his chest. No dysphagia, some odynophagia. No nausea or vomiting. Feels breathing is about the same as it has been.   Objective: Vital signs in last 24 hours: Temp:  [97.5 F (36.4 C)-98.7 F (37.1 C)] 98.2 F (36.8 C) (10/15 0525) Pulse Rate:  [62-114] 110 (10/15 0525) Resp:  [20-22] 20 (10/15 0525) BP: (98-181)/(57-142) 112/79 (10/15 0525) SpO2:  [92 %-100 %] 98 % (10/15 0819) Weight:  [111.6 kg] 111.6 kg (10/14 1309) Last BM Date : 09/30/24 General:   Alert and oriented, pleasant Head:  Normocephalic and atraumatic. Eyes:  No icterus, sclera clear. Conjuctiva pink.  Mouth:  Without lesions, mucosa pink and moist.  Neck:  Supple, without thyromegaly or masses.  Heart:  S1, S2 present, no murmurs noted.  Lungs: Clear to auscultation bilaterally, diminished breath sounds  Abdomen:  Bowel sounds present, soft, non-tender, non-distended. No HSM or hernias noted. No rebound or guarding. No masses appreciated  Neurologic:  Alert and  oriented x4;  grossly normal neurologically. Psych:  Alert and cooperative. Normal mood and affect.  Intake/Output from previous day: 10/14 0701 - 10/15 0700 In: 2033.3 [I.V.:731.8; IV Piggyback:1301.4] Out: -   Lab Results: Recent Labs    10/01/24 0822 10/02/24 0426  WBC 9.6 8.6  HGB 11.1* 10.6*  HCT 32.8* 31.8*  PLT 264 262   BMET Recent Labs    10/01/24 0822 10/02/24 0426  NA 132* 133*  K 3.9 3.4*  CL 93* 97*  CO2 29 26  GLUCOSE 126* 104*  BUN 15 12  CREATININE 1.13 0.94  CALCIUM  9.4 8.9   LFT Recent Labs    10/01/24 0822 10/02/24 0426  PROT 7.8 7.2  ALBUMIN 3.4* 3.1*  AST 30 23  ALT 25 24  ALKPHOS 86 82  BILITOT 0.7 0.5   PT/INR Recent Labs    10/01/24 0822  LABPROT 18.0*  INR 1.4*    Studies/Results: CT CHEST ABDOMEN PELVIS W CONTRAST Result Date: 10/01/2024 CLINICAL DATA:  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  Completed radiation therapy and chemotherapy 04/15/2024 on maintenance pembrolizumab  immunotherapy. Sepsis with chest and abdominal pain with eating/drinking. * Tracking Code: BO * EXAM: CT CHEST, ABDOMEN, AND PELVIS WITH CONTRAST TECHNIQUE: Multidetector CT imaging of the chest, abdomen and pelvis was performed following the standard protocol during bolus administration of intravenous contrast. RADIATION DOSE REDUCTION: This exam was performed according to the departmental dose-optimization program which includes automated exposure control, adjustment of the mA and/or kV according to patient size and/or use of iterative reconstruction technique. CONTRAST:  OMNIPAQUE  IOHEXOL  300 MG/ML  SOLN COMPARISON:  03/31/2024 FINDINGS: CT CHEST FINDINGS Cardiovascular: Left Port-A-Cath tip: SVC. Coronary, aortic arch, and branch vessel atherosclerotic vascular disease. Small anterior pericardial effusion, new compared to 03/31/2024. Mediastinum/Nodes: Subtle stranding in the mediastinum particularly around the trachea and tracheoesophageal groove. Small hilar, subcarinal, and paratracheal lymph nodes are not pathologically enlarged by size criteria. Lungs/Pleura: The right upper lobe pulmonary nodule measures 2.4 by 1.2 cm on image 30 series 3, formerly 3.3 by 1.8 cm on 03/31/2024. Previous cavitary component no longer present. Bandlike density extending around the nodule and below the nodule in the right upper lobe is likely attributable to radiation pneumonitis. Right upper lobe peribronchovascular volume loss with substantial bilateral airway thickening worsened from previous, with some airway plugging particularly in the left lower lobe. The airway thickening causes luminal narrowing. Musculoskeletal: Mild thoracic spondylosis. CT ABDOMEN  PELVIS FINDINGS Hepatobiliary: Faint transient hepatic attenuation difference in segment 4 of the liver without definite underlying lesion identified. Gallbladder surgically absent. No  biliary dilatation. Pancreas: Unremarkable Spleen: Unremarkable Adrenals/Urinary Tract: Unremarkable Stomach/Bowel: There are few scattered sigmoid colon diverticula. No dilated bowel. No specific gastric or duodenal abnormality observed. Formed stool noted in the colon. Vascular/Lymphatic: Atherosclerosis is present, including aortoiliac atherosclerotic disease. Atheromatous plaque noted proximally in the celiac trunk and SMA but not causing occlusion or high-grade stenosis. Reproductive: Unremarkable Other: No supplemental non-categorized findings. Musculoskeletal: Lumbar spondylosis and degenerative disc disease, causing bilateral moderate foraminal impingement at L5-S1. IMPRESSION: 1. The right upper lobe pulmonary nodule is smaller than on the prior exam, currently 2.4 by 1.2 cm, formerly 3.3 by 1.8 cm. Previous cavitary component no longer present. 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 2. Tracheal and bilateral airway thickening with some airway plugging particularly in the left lower lobe. Infectious etiology favored given the diffuse pattern, a component of reactive airways disease is not excluded. 3. Small anterior pericardial effusion, new compared to 03/31/2024. 4. Subtle stranding in the mediastinum particularly around the trachea and tracheoesophageal groove, likely inflammatory. 5. Lumbar spondylosis and degenerative disc disease causing bilateral moderate foraminal impingement at L5-S1. 6. Aortic Atherosclerosis (ICD10-I70.0). Systemic and coronary atherosclerosis. Electronically Signed   By: Ryan Salvage M.D.   On: 10/01/2024 10:56   DG Chest Portable 1 View Result Date: 10/01/2024 EXAM: 1 VIEW(S) XRAY OF THE CHEST 10/01/2024 08:05:00 AM COMPARISON: Prior study. CLINICAL HISTORY: cp. Per triage; Pt in by POV for abd pain / burning. Pt states this is his 3rd ER visit in the last 2 weeks for this pain. Pt can not eat or drink without a fire burning sensation in his chest and abdomen.  Pt has had this ongoing since his cancer radiation in April. Pt ; can not get in to see GI until November. FINDINGS: LINES, TUBES AND DEVICES: Left IJ port catheter to the SVC. LUNGS AND PLEURA: Improved aeration in the lung bases since prior study. No focal pulmonary opacity. No pulmonary edema. No pleural effusion. No pneumothorax. HEART AND MEDIASTINUM: Aortic atherosclerosis. No acute abnormality of the cardiac and mediastinal silhouettes. BONES AND SOFT TISSUES: No acute osseous abnormality. IMPRESSION: 1. No acute cardiopulmonary process. 2. Improved aeration in the lung bases. 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 GI consulted for further evaluation and management of potential esophagitis.   Chest pain, odynohpagia, concern for esophagitis: -Recent history of radiation and chemo for lung cancer, completed April 2025.  -Frequent regular alcohol use and current daily 1 ppd smoker  -Recent antibiotics 2 weeks ago and regular steroid inhaler use without adequate mouth hygiene.  -Worsening burning in the chest and throat and epigastrium returned a month ago and becoming progressively worse.  -initially endorsed trouble toward end of radiation.  -Has been maintained on once daily PPI for a few weeks and only temporary relief from magic mouthwash given by PCP   Differentials include radiation esophagitis, candida esophagitis, esophagitis secondary to ETOH use, reflux esophagitis. Recommending EGD for further evaluation however is on Eliquis for left jugular clot therefore needs to be  held for 48 hours for washout. Plan for possible EGD  Thursday 10/16 pending improvement from respiratory standpoint. He is currenttly on 4L O2 via n.c.   Pneumonia: -Recent course of steroids and antibiotics (clindamycin?) for suspected COPD flare.  -Currently on 4L [place] (2L yesterday) with stable O2 sats (sats 92-100%), baseline at home is 94% on room air.  -Imaging this admission with concern for pneumonia. Congested cough present. Continues to smoke.  -Management with antibiotics per attending.     Plan: -continue carafate 1g QID -PPI BID -continue to Hold eliquis  -Full liquid diet -NPO midnight -EGD possibly on Thursday pending respiratory status, will reassess in the AM  -Outpatient surveillance colonoscopy    LOS: 1 day    10/02/2024, 9:16 AM   Kesley Mullens L. Lakayla Barrington, MSN, APRN, AGNP-C Adult-Gerontology Nurse Practitioner [facility]

## 2024-10-02 NOTE — Progress Notes (Signed)
 PROGRESS NOTE     Juan Merritt, is a 73 y.o. 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  He has been suffering with issues of severe GERD and esophagitis symptoms since radiation therapy admitted on 10/01/2024 with concerns for severe esophagitis and possible pneumonia   -Assessment and Plan: -1) esophagitis with odynophagia  --recent history of radiation and chemo for lung cancer, completed April 2025.  -Frequent regular alcohol use and current daily 1 ppd smoker  -Recent antibiotics 2 weeks ago and regular steroid inhaler use without adequate mouth hygiene.  -Worsening burning in the chest and throat and epigastrium returned a month ago and becoming progressively worse.  - GI consult for EGD requested plans tentatively for possible EGD on 10/03/2024 (Eliquis remains on hold since 10/01/2024) -Continue Protonix  and Carafate  2)H/o Lt Jugular Vein-- DVT--- Eliquis on hold as above #1 to allow for possible EGD  3) acute hypoxic respiratory failure--patient with COPD and lung cancer -Currently requiring 4 L of oxygen  via nasal -Bronchodilators and mucolytics as ordered -IV Solu-Medrol  - Wean off O2 as able   4)CAP--RLL in a patient with history of lung cancer status post radiation -Continue Rocephin/azithromycin , along with bronchodilators and mucolytics --Respiratory symptoms improving slowly --Oxygen  as above #3  5) tobacco abuse--not ready to quit smoking at this time  6)DM2-anticipate worsening  hyperglycemia with steroid Use Novolog /Humalog Sliding scale insulin  with Accu-Cheks/Fingersticks as ordered   Status is: Inpatient   Disposition: The patient is from: Home              Anticipated d/c is to: Home              Anticipated d/c date is: 2 days              Patient currently is not medically stable to d/c. Barriers: Not Clinically Stable-   Code Status :  -  Code Status: Full Code   Family Communication:    NA (patient is alert, awake and coherent)   DVT Prophylaxis  :   - SCDs   Place and maintain sequential compression device Start: 10/01/24 1438   Lab Results  Component Value Date   PLT 262 10/02/2024    Inpatient Medications  Scheduled Meds:  allopurinol   100 mg Oral Daily   fluticasone  furoate-vilanterol  1 puff Inhalation Daily   folic acid   1 mg Oral Daily   guaiFENesin   600 mg Oral BID   insulin  aspart  0-9 Units Subcutaneous TID WC   ipratropium-albuterol   3 mL Nebulization Q6H   multivitamin with minerals  1 tablet Oral Daily   pantoprazole  (PROTONIX ) IV  40 mg Intravenous BID   sucralfate  1 g Oral TID WC & HS   tamsulosin   0.4 mg Oral QPC supper   thiamine   100 mg Oral Daily   Or   thiamine   100 mg Intravenous Daily   Continuous Infusions:  azithromycin  Stopped (10/02/24 0915)   cefTRIAXone (ROCEPHIN)  IV Stopped (10/01/24 2311)   PRN  Meds:.acetaminophen  **OR** acetaminophen , bisacodyl, fentaNYL  (SUBLIMAZE ) injection, guaiFENesin -dextromethorphan, LORazepam  **OR** LORazepam , nicotine , ondansetron  **OR** ondansetron  (ZOFRAN ) IV, oxyCODONE , traZODone   Anti-infectives (From admission, onward)    Start     Dose/Rate Route Frequency Ordered Stop   10/02/24 0800  azithromycin  (ZITHROMAX ) 500 mg in sodium chloride  0.9 % 250 mL IVPB        500 mg 250 mL/hr over 60 Minutes Intravenous Every 24 hours 10/01/24 1220 10/06/24 0759   10/01/24 2200  cefTRIAXone (ROCEPHIN) 2 g in sodium chloride  0.9 % 100 mL IVPB        2 g 200 mL/hr over 30 Minutes  Intravenous Every 24 hours 10/01/24 1220 10/05/24 2159   10/01/24 0830  vancomycin  (VANCOREADY) IVPB 2000 mg/400 mL        2,000 mg 200 mL/hr over 120 Minutes Intravenous  Once 10/01/24 0816 10/01/24 1333   10/01/24 0815  ceFEPIme  (MAXIPIME ) 2 g in sodium chloride  0.9 % 100 mL IVPB        2 g 200 mL/hr over 30 Minutes Intravenous  Once 10/01/24 0809 10/01/24 0930   10/01/24 0815  metroNIDAZOLE  (FLAGYL ) IVPB 500 mg        500 mg 100 mL/hr over 60 Minutes Intravenous  Once 10/01/24 0809 10/01/24 1103   10/01/24 0815  vancomycin  (VANCOCIN ) IVPB 1000 mg/200 mL premix  Status:  Discontinued        1,000 mg 200 mL/hr over 60 Minutes Intravenous  Once 10/01/24 0809 10/01/24 0816         Subjective: Raheel Uballe today has no fevers, no further emesis,  No chest pain,   - Cough and dyspnea improving -Continues to require 3 to 4 L of oxygen  at this time - Had brown BM  Objective: Vitals:   10/02/24 0525 10/02/24 0819 10/02/24 1406 10/02/24 1909  BP: 112/79  113/79 (!) 123/99  Pulse: (!) 110  (!) 127 (!) 105  Resp: 20  20 20   Temp: 98.2 F (36.8 C)  99 F (37.2 C) 98.8 F (37.1 C)  TempSrc: Oral  Oral Oral  SpO2: 98% 98% 98% 98%  Weight:      Height:        Intake/Output Summary (Last 24 hours) at 10/02/2024 1914 Last data filed at 10/02/2024 0500 Gross per 24 hour  Intake 831.82 ml  Output --  Net 831.82 ml   Filed Weights   10/01/24 0750 10/01/24 1309  Weight: 112 kg 111.6 kg    Physical Exam  Gen:- Awake Alert,   HEENT:- [place].AT, No sclera icterus Neck-Supple Neck,No JVD,.  Lungs-  CTAB , fair symmetrical air movement CV- S1, S2 normal, regular  Abd-  +ve B.Sounds, Abd Soft, No tenderness,    Extremity/Skin:- No  edema, pedal pulses present  Psych-affect is appropriate, oriented x3 Neuro-no new focal deficits, no tremors  Data Reviewed: I have personally reviewed following labs and imaging studies  CBC: Recent Labs  Lab 10/01/24 0822 10/02/24 0426  WBC  9.6 8.6  NEUTROABS 8.2* 7.1  HGB 11.1* 10.6*  HCT 32.8* 31.8*  MCV 100.0 100.3*  PLT 264 262   Basic Metabolic Panel: Recent Labs  Lab 10/01/24 0822 10/02/24 0426  NA 132* 133*  K 3.9 3.4*  CL 93* 97*  CO2 29 26  GLUCOSE 126* 104*  BUN 15 12  CREATININE 1.13 0.94  CALCIUM  9.4 8.9  MG  --  2.7*  PHOS  --  3.1   GFR: Estimated Creatinine Clearance: 90.9 mL/min (by C-G  formula based on SCr of 0.94 mg/dL). Liver Function Tests: Recent Labs  Lab 10/01/24 0822 10/02/24 0426  AST 30 23  ALT 25 24  ALKPHOS 86 82  BILITOT 0.7 0.5  PROT 7.8 7.2  ALBUMIN 3.4* 3.1*   Recent Results (from the past 240 hours)  Resp panel by RT-PCR (RSV, Flu A&B, Covid) Anterior Nasal Swab     Status: None   Collection Time: 10/01/24  8:04 AM   Specimen: Anterior Nasal Swab  Result Value Ref Range Status   SARS Coronavirus 2 by RT PCR NEGATIVE NEGATIVE Final    Comment: (NOTE) SARS-CoV-2 target nucleic acids are NOT DETECTED.  The SARS-CoV-2 RNA is generally detectable in upper respiratory specimens during the acute phase of infection. The lowest concentration of SARS-CoV-2 viral copies this assay can detect is 138 copies/mL. A negative result does not preclude SARS-Cov-2 infection and should not be used as the sole basis for treatment or other patient management decisions. A negative result may occur with  improper specimen collection/handling, submission of specimen other than nasopharyngeal swab, presence of viral mutation(s) within the areas targeted by this assay, and inadequate number of viral copies(<138 copies/mL). A negative result must be combined with clinical observations, patient history, and epidemiological information. The expected result is Negative.  Fact Sheet for Patients:  BloggerCourse.com  Fact Sheet for Healthcare Providers:  SeriousBroker.it  This test is no t yet approved or cleared by the [place]  FDA and   has been authorized for detection and/or diagnosis of SARS-CoV-2 by FDA under an Emergency Use Authorization (EUA). This EUA will remain  in effect (meaning this test can be used) for the duration of the COVID-19 declaration under Section 564(b)(1) of the Act, 21 U.S.C.section 360bbb-3(b)(1), unless the authorization is terminated  or revoked sooner.       Influenza A by PCR NEGATIVE NEGATIVE Final   Influenza B by PCR NEGATIVE NEGATIVE Final    Comment: (NOTE) The Xpert Xpress SARS-CoV-2/FLU/RSV plus assay is intended as an aid in the diagnosis of influenza from Nasopharyngeal swab specimens and should not be used as a sole basis for treatment. Nasal washings and aspirates are unacceptable for Xpert Xpress SARS-CoV-2/FLU/RSV testing.  Fact Sheet for Patients: BloggerCourse.com  Fact Sheet for Healthcare Providers: SeriousBroker.it  This test is not yet approved or cleared by the [place]  FDA and has been authorized for detection and/or diagnosis of SARS-CoV-2 by FDA under an Emergency Use Authorization (EUA). This EUA will remain in effect (meaning this test can be used) for the duration of the COVID-19 declaration under Section 564(b)(1) of the Act, 21 U.S.C. section 360bbb-3(b)(1), unless the authorization is terminated or revoked.     Resp Syncytial Virus by PCR NEGATIVE NEGATIVE Final    Comment: (NOTE) Fact Sheet for Patients: BloggerCourse.com  Fact Sheet for Healthcare Providers: SeriousBroker.it  This test is not yet approved or cleared by the [place]  FDA and has been authorized for detection and/or diagnosis of SARS-CoV-2 by FDA under an Emergency Use Authorization (EUA). This EUA will remain in effect (meaning this test can be used) for the duration of the COVID-19 declaration under Section 564(b)(1) of the Act, 21 U.S.C. section 360bbb-3(b)(1),  unless the authorization is terminated or revoked.  Performed at [facility], [address], [place], [place] [address]   Culture, blood (Routine x 2)     Status: None (Preliminary result)   Collection Time: 10/01/24  8:22 AM   Specimen: BLOOD  Result Value Ref  Range Status   Specimen Description BLOOD LEFT ANTECUBITAL  Final   Special Requests   Final    BOTTLES DRAWN AEROBIC AND ANAEROBIC Blood Culture adequate volume   Culture   Final    NO GROWTH < 24 HOURS Performed at [facility], [address], [place], [place] [address]    Report Status PENDING  Incomplete  Culture, blood (Routine x 2)     Status: None (Preliminary result)   Collection Time: 10/01/24  8:22 AM   Specimen: BLOOD  Result Value Ref Range Status   Specimen Description BLOOD RIGHT ANTECUBITAL  Final   Special Requests   Final    BOTTLES DRAWN AEROBIC AND ANAEROBIC Blood Culture adequate volume   Culture   Final    NO GROWTH < 24 HOURS Performed at [facility], [address] [place], [place] [address]    Report Status PENDING  Incomplete    Radiology Studies: CT CHEST ABDOMEN PELVIS W CONTRAST Result Date: 10/01/2024 CLINICAL DATA:  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 Completed radiation therapy and chemotherapy 04/15/2024 on maintenance pembrolizumab  immunotherapy. Sepsis with chest and abdominal pain with eating/drinking. * Tracking Code: BO * EXAM: CT CHEST, ABDOMEN, AND PELVIS WITH CONTRAST TECHNIQUE: Multidetector CT imaging of the chest, abdomen and pelvis was performed following the standard protocol during bolus administration of intravenous contrast. RADIATION DOSE REDUCTION: This exam was performed according to the departmental dose-optimization program which includes automated exposure control, adjustment of the mA and/or kV according to patient size and/or use of iterative reconstruction technique. CONTRAST:  OMNIPAQUE  IOHEXOL  300 MG/ML  SOLN  COMPARISON:  03/31/2024 FINDINGS: CT CHEST FINDINGS Cardiovascular: Left Port-A-Cath tip: SVC. Coronary, aortic arch, and branch vessel atherosclerotic vascular disease. Small anterior pericardial effusion, new compared to 03/31/2024. Mediastinum/Nodes: Subtle stranding in the mediastinum particularly around the trachea and tracheoesophageal groove. Small hilar, subcarinal, and paratracheal lymph nodes are not pathologically enlarged by size criteria. Lungs/Pleura: The right upper lobe pulmonary nodule measures 2.4 by 1.2 cm on image 30 series 3, formerly 3.3 by 1.8 cm on 03/31/2024. Previous cavitary component no longer present. Bandlike density extending around the nodule and below the nodule in the right upper lobe is likely attributable to radiation pneumonitis. Right upper lobe peribronchovascular volume loss with substantial bilateral airway thickening worsened from previous, with some airway plugging particularly in the left lower lobe. The airway thickening causes luminal narrowing. Musculoskeletal: Mild thoracic spondylosis. CT ABDOMEN PELVIS FINDINGS Hepatobiliary: Faint transient hepatic attenuation difference in segment 4 of the liver without definite underlying lesion identified. Gallbladder surgically absent. No biliary dilatation. Pancreas: Unremarkable Spleen: Unremarkable Adrenals/Urinary Tract: Unremarkable Stomach/Bowel: There are few scattered sigmoid colon diverticula. No dilated bowel. No specific gastric or duodenal abnormality observed. Formed stool noted in the colon. Vascular/Lymphatic: Atherosclerosis is present, including aortoiliac atherosclerotic disease. Atheromatous plaque noted proximally in the celiac trunk and SMA but not causing occlusion or high-grade stenosis. Reproductive: Unremarkable Other: No supplemental non-categorized findings. Musculoskeletal: Lumbar spondylosis and degenerative disc disease, causing bilateral moderate foraminal impingement at L5-S1. IMPRESSION: 1. The  right upper lobe pulmonary nodule is smaller than on the prior exam, currently 2.4 by 1.2 cm, formerly 3.3 by 1.8 cm. Previous cavitary component no longer present. 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 2. Tracheal and bilateral airway thickening with some airway plugging particularly in the left lower lobe. Infectious etiology favored given the diffuse pattern, a component of reactive airways disease is not excluded. 3. Small  anterior pericardial effusion, new compared to 03/31/2024. 4. Subtle stranding in the mediastinum particularly around the trachea and tracheoesophageal groove, likely inflammatory. 5. Lumbar spondylosis and degenerative disc disease causing bilateral moderate foraminal impingement at L5-S1. 6. Aortic Atherosclerosis (ICD10-I70.0). Systemic and coronary atherosclerosis. Electronically Signed   By: Ryan Salvage M.D.   On: 10/01/2024 10:56   DG Chest Portable 1 View Result Date: 10/01/2024 EXAM: 1 VIEW(S) XRAY OF THE CHEST 10/01/2024 08:05:00 AM COMPARISON: Prior study. CLINICAL HISTORY: cp. Per triage; Pt in by POV for abd pain / burning. Pt states this is his 3rd ER visit in the last 2 weeks for this pain. Pt can not eat or drink without a fire burning sensation in his chest and abdomen. Pt has had this ongoing since his cancer radiation in April. Pt ; can not get in to see GI until November. FINDINGS: LINES, TUBES AND DEVICES: Left IJ port catheter to the SVC. LUNGS AND PLEURA: Improved aeration in the lung bases since prior study. No focal pulmonary opacity. No pulmonary edema. No pleural effusion. No pneumothorax. HEART AND MEDIASTINUM: Aortic atherosclerosis. No acute abnormality of the cardiac and mediastinal silhouettes. BONES AND SOFT TISSUES: No acute osseous abnormality. IMPRESSION: 1. No acute cardiopulmonary process. 2. Improved aeration in the lung bases. Electronically signed by: Dayne Hassell MD 10/01/2024 08:09 AM EDT RP Workstation: [id number]   Scheduled  Meds:  allopurinol   100 mg Oral Daily   fluticasone  furoate-vilanterol  1 puff Inhalation Daily   folic acid   1 mg Oral Daily   guaiFENesin   600 mg Oral BID   insulin  aspart  0-9 Units Subcutaneous TID WC   ipratropium-albuterol   3 mL Nebulization Q6H   multivitamin with minerals  1 tablet Oral Daily   pantoprazole  (PROTONIX ) IV  40 mg Intravenous BID   sucralfate  1 g Oral TID WC & HS   tamsulosin   0.4 mg Oral QPC supper   thiamine   100 mg Oral Daily   Or   thiamine   100 mg Intravenous Daily   Continuous Infusions:  azithromycin  Stopped (10/02/24 0915)   cefTRIAXone (ROCEPHIN)  IV Stopped (10/01/24 2311)     LOS: 1 day   Rendall Carwin M.D on 10/02/2024 at 7:14 PM  Go to [email or web address] - for contact info  [facility] - Office  [phone]  If 7PM-7AM, please contact night-coverage [email or web address] 10/02/2024, 7:14 PM

## 2024-10-02 NOTE — Plan of Care (Signed)
  Problem: Clinical Measurements: Goal: Ability to maintain clinical measurements within normal limits will improve Outcome: Progressing Goal: Will remain free from infection Outcome: Progressing Goal: Diagnostic test results will improve Outcome: Progressing Goal: Respiratory complications will improve Outcome: Progressing Goal: Cardiovascular complication will be avoided Outcome: Progressing   Problem: Coping: Goal: Level of anxiety will decrease Outcome: Progressing   Problem: Elimination: Goal: Will not experience complications related to bowel motility Outcome: Progressing Goal: Will not experience complications related to urinary retention Outcome: Progressing   Problem: Pain Managment: Goal: General experience of comfort will improve and/or be controlled Outcome: Progressing

## 2024-10-02 NOTE — TOC CM/SW Note (Signed)
 Transition of Care [facility]) - Inpatient Brief Assessment   Patient Details  Name: Juan Merritt MRN: [id number] Date of Birth: 08/01/1951  Transition of Care [facility]) CM/SW Contact:    Lucie Lunger, LCSWA Phone Number: 10/02/2024, 9:08 AM   Clinical Narrative: Transition of Care Department [facility]) has reviewed patient and no TOC needs have been identified at this time. We will continue to monitor patient advancement through interdiciplinary progression rounds. If new patient transition needs arise, please place a TOC consult.  Transition of Care Asessment: Insurance and Status: Insurance coverage has been reviewed Patient has primary care physician: Yes Home environment has been reviewed: From home Prior level of function:: Independent Prior/Current Home Services: No current home services Social Drivers of Health Review: SDOH reviewed no interventions necessary Readmission risk has been reviewed: Yes Transition of care needs: no transition of care needs at this time

## 2024-10-03 DIAGNOSIS — A419 Sepsis, unspecified organism: Secondary | ICD-10-CM | POA: Diagnosis not present

## 2024-10-03 DIAGNOSIS — R1310 Dysphagia, unspecified: Secondary | ICD-10-CM | POA: Diagnosis not present

## 2024-10-03 DIAGNOSIS — J189 Pneumonia, unspecified organism: Secondary | ICD-10-CM | POA: Diagnosis not present

## 2024-10-03 LAB — CBC WITH DIFFERENTIAL/PLATELET
Abs Immature Granulocytes: 0.03 10*3/uL (ref 0.00–0.07)
Basophils Absolute: 0 10*3/uL (ref 0.0–0.1)
Basophils Relative: 0 %
Eosinophils Absolute: 0 10*3/uL (ref 0.0–0.5)
Eosinophils Relative: 0 %
HCT: 32.8 % — ABNORMAL LOW (ref 39.0–52.0)
Hemoglobin: 10.8 g/dL — ABNORMAL LOW (ref 13.0–17.0)
Immature Granulocytes: 1 %
Lymphocytes Relative: 5 %
Lymphs Abs: 0.3 10*3/uL — ABNORMAL LOW (ref 0.7–4.0)
MCH: 33.1 pg (ref 26.0–34.0)
MCHC: 32.9 g/dL (ref 30.0–36.0)
MCV: 100.6 fL — ABNORMAL HIGH (ref 80.0–100.0)
Monocytes Absolute: 0.1 10*3/uL (ref 0.1–1.0)
Monocytes Relative: 1 %
Neutro Abs: 5.5 10*3/uL (ref 1.7–7.7)
Neutrophils Relative %: 93 %
Platelets: 251 10*3/uL (ref 150–400)
RBC: 3.26 MIL/uL — ABNORMAL LOW (ref 4.22–5.81)
RDW: 12.3 % (ref 11.5–15.5)
WBC: 5.9 10*3/uL (ref 4.0–10.5)
nRBC: 0 % (ref 0.0–0.2)

## 2024-10-03 LAB — COMPREHENSIVE METABOLIC PANEL WITH GFR
ALT: 23 U/L (ref 0–44)
AST: 26 U/L (ref 15–41)
Albumin: 3.1 g/dL — ABNORMAL LOW (ref 3.5–5.0)
Alkaline Phosphatase: 75 U/L (ref 38–126)
Anion gap: 8 (ref 5–15)
BUN: 12 mg/dL (ref 8–23)
CO2: 28 mmol/L (ref 22–32)
Calcium: 9.2 mg/dL (ref 8.9–10.3)
Chloride: 100 mmol/L (ref 98–111)
Creatinine, Ser: 0.91 mg/dL (ref 0.61–1.24)
GFR, Estimated: >60 mL/min
Glucose, Bld: 208 mg/dL — ABNORMAL HIGH (ref 70–99)
Potassium: 3.7 mmol/L (ref 3.5–5.1)
Sodium: 137 mmol/L (ref 135–145)
Total Bilirubin: 0.3 mg/dL (ref 0.0–1.2)
Total Protein: 7.3 g/dL (ref 6.5–8.1)

## 2024-10-03 LAB — PHOSPHORUS: Phosphorus: 4.3 mg/dL (ref 2.5–4.6)

## 2024-10-03 LAB — GLUCOSE, CAPILLARY
Glucose-Capillary: 177 mg/dL — ABNORMAL HIGH (ref 70–99)
Glucose-Capillary: 176 mg/dL — ABNORMAL HIGH (ref 70–99)
Glucose-Capillary: 155 mg/dL — ABNORMAL HIGH (ref 70–99)
Glucose-Capillary: 204 mg/dL — ABNORMAL HIGH (ref 70–99)

## 2024-10-03 LAB — MAGNESIUM: Magnesium: 3.2 mg/dL — ABNORMAL HIGH (ref 1.7–2.4)

## 2024-10-03 MED ORDER — ADULT MULTIVITAMIN W/MINERALS CH: 1.0000 | ORAL_TABLET | Freq: Every day | ORAL | Status: DC

## 2024-10-03 MED ORDER — FUROSEMIDE 10 MG/ML IJ SOLN
20.0000 mg | Freq: Two times a day (BID) | INTRAMUSCULAR | Status: AC
  Administered 2024-10-03 (×2): 20 mg via INTRAVENOUS
  Filled 2024-10-03 (×2): qty 2

## 2024-10-03 MED ORDER — IPRATROPIUM-ALBUTEROL 0.5-2.5 (3) MG/3ML IN SOLN
3.0000 mL | Freq: Three times a day (TID) | RESPIRATORY_TRACT | Status: DC
  Filled 2024-10-03: qty 3

## 2024-10-03 NOTE — Progress Notes (Signed)
 Gastroenterology Progress Note   Referring Provider: No ref. provider found Primary Care Physician:  Zollie Lowers, MD Primary Gastroenterologist:    Patient ID: Juan Merritt; [id number]; [birth year]   Subjective:    Wife at bedside. States he's groggy from trazodone and ativan . Patient is sitting upright in chair. Breathing comfortably. Oxygen  at 4L [place]. No attempt at weaning overnight. No notable coughing this morning. Feels some improvement with burning in the throat/chest. Feels breathing somewhat better.   Objective:   Vital signs in last 24 hours: Temp:  [98 F (36.7 C)-99 F (37.2 C)] 98.5 F (36.9 C) (10/16 0443) Pulse Rate:  [88-127] 88 (10/16 0443) Resp:  [20] 20 (10/16 0443) BP: (104-128)/(76-99) 116/84 (10/16 0443) SpO2:  [92 %-99 %] 93 % (10/16 0837) Last BM Date : 10/02/24 General:   Alert,  Well-developed, well-nourished, pleasant and cooperative in NAD. Somewhat slow to respond but appropriate Head:  Normocephalic and atraumatic. Eyes:  Sclera clear, no icterus.  Chest: CTA bilaterally without rales, rhonchi, crackles.  Diminished breath sounds left compared to right. Heart:  Regular rate and rhythm; no murmurs, clicks, rubs,  or gallops. Abdomen:  Soft, nontender and nondistended.   Extremities:  Without clubbing, deformity or edema. Neurologic:  Alert and  oriented to person, place,  grossly normal neurologically. Somewhat slow to respond Skin:  Intact without significant lesions or rashes. Psych:  Alert and cooperative. Somewhat flat affect.  Intake/Output from previous day: 10/15 0701 - 10/16 0700 In: 350 [IV Piggyback:350] Out: -  Intake/Output this shift: No intake/output data recorded.  Lab Results: CBC Recent Labs    10/01/24 0822 10/02/24 0426 10/03/24 0409  WBC 9.6 8.6 5.9  HGB 11.1* 10.6* 10.8*  HCT 32.8* 31.8* 32.8*  MCV 100.0 100.3* 100.6*  PLT 264 262 251   BMET Recent Labs    10/01/24 0822 10/02/24 0426 10/03/24 0409  NA  132* 133* 137  K 3.9 3.4* 3.7  CL 93* 97* 100  CO2 29 26 28   GLUCOSE 126* 104* 208*  BUN 15 12 12   CREATININE 1.13 0.94 0.91  CALCIUM  9.4 8.9 9.2   LFTs Recent Labs    10/01/24 0822 10/02/24 0426 10/03/24 0409  BILITOT 0.7 0.5 0.3  ALKPHOS 86 82 75  AST 30 23 26   ALT 25 24 23   PROT 7.8 7.2 7.3  ALBUMIN 3.4* 3.1* 3.1*   Recent Labs    10/01/24 0822  LIPASE 40   PT/INR Recent Labs    10/01/24 0822  LABPROT 18.0*  INR 1.4*         Imaging Studies: CT CHEST ABDOMEN PELVIS W CONTRAST Result Date: 10/01/2024 CLINICAL DATA:  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 Completed radiation therapy and chemotherapy 04/15/2024 on maintenance pembrolizumab  immunotherapy. Sepsis with chest and abdominal pain with eating/drinking. * Tracking Code: BO * EXAM: CT CHEST, ABDOMEN, AND PELVIS WITH CONTRAST TECHNIQUE: Multidetector CT imaging of the chest, abdomen and pelvis was performed following the standard protocol during bolus administration of intravenous contrast. RADIATION DOSE REDUCTION: This exam was performed according to the departmental dose-optimization program which includes automated exposure control, adjustment of the mA and/or kV according to patient size and/or use of iterative reconstruction technique. CONTRAST:  OMNIPAQUE  IOHEXOL  300 MG/ML  SOLN COMPARISON:  03/31/2024 FINDINGS: CT CHEST FINDINGS Cardiovascular: Left Port-A-Cath tip: SVC. Coronary, aortic arch, and branch vessel atherosclerotic vascular disease. Small anterior pericardial effusion, new compared to 03/31/2024. Mediastinum/Nodes: Subtle stranding  in the mediastinum particularly around the trachea and tracheoesophageal groove. Small hilar, subcarinal, and paratracheal lymph nodes are not pathologically enlarged by size criteria. Lungs/Pleura: The right upper lobe pulmonary nodule measures 2.4 by 1.2 cm on image 30 series 3, formerly 3.3 by 1.8 cm on 03/31/2024.  Previous cavitary component no longer present. Bandlike density extending around the nodule and below the nodule in the right upper lobe is likely attributable to radiation pneumonitis. Right upper lobe peribronchovascular volume loss with substantial bilateral airway thickening worsened from previous, with some airway plugging particularly in the left lower lobe. The airway thickening causes luminal narrowing. Musculoskeletal: Mild thoracic spondylosis. CT ABDOMEN PELVIS FINDINGS Hepatobiliary: Faint transient hepatic attenuation difference in segment 4 of the liver without definite underlying lesion identified. Gallbladder surgically absent. No biliary dilatation. Pancreas: Unremarkable Spleen: Unremarkable Adrenals/Urinary Tract: Unremarkable Stomach/Bowel: There are few scattered sigmoid colon diverticula. No dilated bowel. No specific gastric or duodenal abnormality observed. Formed stool noted in the colon. Vascular/Lymphatic: Atherosclerosis is present, including aortoiliac atherosclerotic disease. Atheromatous plaque noted proximally in the celiac trunk and SMA but not causing occlusion or high-grade stenosis. Reproductive: Unremarkable Other: No supplemental non-categorized findings. Musculoskeletal: Lumbar spondylosis and degenerative disc disease, causing bilateral moderate foraminal impingement at L5-S1. IMPRESSION: 1. The right upper lobe pulmonary nodule is smaller than on the prior exam, currently 2.4 by 1.2 cm, formerly 3.3 by 1.8 cm. Previous cavitary component no longer present. 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 2. Tracheal and bilateral airway thickening with some airway plugging particularly in the left lower lobe. Infectious etiology favored given the diffuse pattern, a component of reactive airways disease is not excluded. 3. Small anterior pericardial effusion, new compared to 03/31/2024. 4. Subtle stranding in the mediastinum particularly around the trachea and tracheoesophageal  groove, likely inflammatory. 5. Lumbar spondylosis and degenerative disc disease causing bilateral moderate foraminal impingement at L5-S1. 6. Aortic Atherosclerosis (ICD10-I70.0). Systemic and coronary atherosclerosis. Electronically Signed   By: Ryan Salvage M.D.   On: 10/01/2024 10:56   DG Chest Portable 1 View Result Date: 10/01/2024 EXAM: 1 VIEW(S) XRAY OF THE CHEST 10/01/2024 08:05:00 AM COMPARISON: Prior study. CLINICAL HISTORY: cp. Per triage; Pt in by POV for abd pain / burning. Pt states this is his 3rd ER visit in the last 2 weeks for this pain. Pt can not eat or drink without a fire burning sensation in his chest and abdomen. Pt has had this ongoing since his cancer radiation in April. Pt ; can not get in to see GI until November. FINDINGS: LINES, TUBES AND DEVICES: Left IJ port catheter to the SVC. LUNGS AND PLEURA: Improved aeration in the lung bases since prior study. No focal pulmonary opacity. No pulmonary edema. No pleural effusion. No pneumothorax. HEART AND MEDIASTINUM: Aortic atherosclerosis. No acute abnormality of the cardiac and mediastinal silhouettes. BONES AND SOFT TISSUES: No acute osseous abnormality. IMPRESSION: 1. No acute cardiopulmonary process. 2. Improved aeration in the lung bases. 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 GI consulted for further evaluation and management of potential esophagitis.   Chest pain, odynophagia, concern for esophagitis: -Recent history of radiation  and chemo for lung cancer, completed April 2025.  -Frequent regular alcohol use and current daily 1 ppd smoker   -Recent antibiotics 2 weeks ago and regular steroid inhaler use without adequate mouth hygiene.  -Worsening burning in the chest and throat and epigastrium returned a month ago and becoming progressively worse.  -initially endorsed trouble toward end of radiation.  -Has been maintained on once daily PPI for a few weeks and only temporary relief from magic mouthwash given by PCP   Differentials include radiation esophagitis, candida esophagitis, esophagitis secondary to ETOH use, reflux esophagitis. Recommending EGD for further evaluation however is on Eliquis for left jugular clot therefore needs to be held for 48 hours for washout. Plan was for possible EGD today 10/16 pending improvement from respiratory standpoint. He is remains on 4L O2 via [place]. His Magnesium  continues to increase. Anesthesia (Dr. Kendell) and Dr. Eartha both are recommending EGD for Friday.    Pneumonia: -Recent course of steroids and antibiotics (clindamycin?) for suspected COPD flare.  -in the last 24 hours on 4L [place] (2L 10/14) with stable O2 sats (sats 92-99%), baseline at home is 94% on room air.  -trial off oxygen  this morning with drop in oxygen  to 93% on RA near baseline. Being monitored.  -Imaging this admission with concern for pneumonia. Congested cough present. Continues to smoke.  -Management with antibiotics per attending.    Hypermagnesemia:  Magnesium  2.7-->3.2 -hold off on EGD today  -management per attending  Plan:   Full liquids NPO after midnight Continue to hold Eliquis Continue Carafate 1g QID Continue PPI BID. Management of Hypermagnesemia per attending EGD for Friday.  I have discussed the risks, alternatives, benefits with regards to but not limited to the risk of reaction to medication, bleeding, infection, perforation and the patient is agreeable to proceed. Written consent to be obtained. Outpatient colonoscopy.    LOS: 2 days   Juan Merritt. Juan Merritt [facility] [phone] 10/16/20259:42 AM

## 2024-10-03 NOTE — TOC Initial Note (Signed)
 Transition of Care [facility]) - Initial/Assessment Note    Patient Details  Name: Juan Merritt MRN: [id number] Date of Birth: [birth year]  [facility]) CM/SW Contact:    Lucie Lunger, LCSWA Phone Number: 10/03/2024, 1:58 PM  Clinical Narrative:                 Pt is high risk for readmission. CSW spoke with pt at bedside to complete assessment. Pt states he lives with his wife. Pt is independent in completing ADLs and drives to appointments when needed. Pt states he does not have HH and does not use any DME. TOC to follow.   Expected Discharge Plan: Home/Self Care Barriers to Discharge: Continued Medical Work up   Patient Goals and CMS Choice Patient states their goals for this hospitalization and ongoing recovery are:: return home CMS Medicare.gov Compare Post Acute Care list provided to:: Patient Choice offered to / list presented to : Patient      Expected Discharge Plan and Services In-house Referral: Clinical Social Work Discharge Planning Services: CM Consult   Living arrangements for the past 2 months: Single Family Home                                      Prior Living Arrangements/Services Living arrangements for the past 2 months: Single Family Home Lives with:: Self Patient language and need for interpreter reviewed:: Yes Do you feel safe going back to the place where you live?: Yes      Need for Family Participation in Patient Care: Yes (Comment) Care giver support system in place?: Yes (comment)   Criminal Activity/Legal Involvement Pertinent to Current Situation/Hospitalization: No - Comment as needed  Activities of Daily Living   ADL Screening (condition at time of admission) Independently performs ADLs?: Yes (appropriate for developmental age) Is the patient deaf or have difficulty hearing?: No Does the patient have difficulty seeing, even when wearing glasses/contacts?: No Does the patient have difficulty concentrating,  remembering, or making decisions?: No  Permission Sought/Granted                  Emotional Assessment Appearance:: Appears stated age Attitude/Demeanor/Rapport: Engaged Affect (typically observed): Accepting Orientation: : Oriented to Self, Oriented to Place, Oriented to  Time, Oriented to Situation Alcohol / Substance Use: Not Applicable Psych Involvement: No (comment)  Admission diagnosis:  Sepsis due to pneumonia (HCC) [J18.9, A41.9] Community acquired pneumonia of left lower lobe of lung [J18.9] Sepsis without acute organ dysfunction, due to unspecified organism (HCC) [A41.9] Gastroesophageal reflux disease, unspecified whether esophagitis present [K21.9] Patient Active Problem List   Diagnosis Date Noted   Sepsis due to pneumonia (HCC) 10/01/2024   Chronic alcohol abuse 10/01/2024   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   Odynophagia 10/01/2024   Hyponatremia 10/01/2024   FTT (failure to thrive) in adult 10/01/2024   SOB (shortness of breath) 09/17/2024   Gastroesophageal reflux disease with esophagitis without hemorrhage 09/17/2024   COPD with acute exacerbation (HCC) 09/17/2024   Encounter for smoking cessation counseling 09/17/2024   Tobacco dependence 09/17/2024   Type 2 diabetes mellitus with diabetic neuropathy, with long-term current use of insulin  (HCC) 08/07/2024   Transaminitis 04/11/2024   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   Fever 03/31/2024   Hypotension 03/31/2024   Dehydration 03/31/2024   Weakness generalized 03/31/2024   Generalized weakness 03/31/2024   Malignant neoplasm of upper lobe of right lung (HCC) 12/01/2023  Positive colorectal cancer screening using Cologuard test 10/01/2021   Nonspecific abnormal electrocardiogram (ECG) (EKG) 06/23/2020   Hypertension associated with diabetes (HCC) 05/05/2020   Mixed hyperlipidemia 05/05/2020   Hyperuricemia 05/05/2020   Morbid obesity due to excess calories (HCC) 09/23/2019   Gout, chronic 07/01/2016    Incomplete rotator cuff tear 09/07/2015   Essential hypertension, benign 09/07/2015   COPD GOLD 2 03/29/2015   Cigarette smoker 03/24/2015   PCP:  Zollie Lowers, MD Pharmacy:   [facility] [phone] - [place], [place] - [address] [place] [address] Phone: [phone] Fax: [phone]     Social Drivers of Health (SDOH) Social History: SDOH Screenings   Food Insecurity: No Food Insecurity (10/01/2024)  Housing: Low Risk  (10/01/2024)  Transportation Needs: No Transportation Needs (10/01/2024)  Utilities: Not At Risk (10/01/2024)  Alcohol Screen: Low Risk  (08/22/2024)  Depression (PHQ2-9): Low Risk  (09/11/2024)  Financial Resource Strain: Low Risk  (08/22/2024)  Physical Activity: Inactive (08/22/2024)  Social Connections: Moderately Isolated (10/01/2024)  Stress: No Stress Concern Present (08/22/2024)  Tobacco Use: High Risk (10/01/2024)  Health Literacy: Adequate Health Literacy (08/22/2024)   SDOH Interventions:     Readmission Risk Interventions    10/03/2024    1:57 PM  Readmission Risk Prevention Plan  Transportation Screening Complete  HRI or Home Care Consult Complete  Social Work Consult for Recovery Care Planning/Counseling Complete  Palliative Care Screening Not Applicable  Medication Review Oceanographer) Complete

## 2024-10-03 NOTE — Progress Notes (Addendum)
 PROGRESS NOTE  Juan Merritt, is a 73 y.o. 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  He has been suffering with issues of severe GERD and esophagitis symptoms since radiation therapy admitted on 10/01/2024 with concerns for severe esophagitis and possible pneumonia   -Assessment and Plan: -1) esophagitis with odynophagia  --recent history of radiation and chemo for lung cancer, completed April 2025.  -Frequent regular alcohol use and current daily 1 ppd smoker  -Recent antibiotics 2 weeks ago and regular steroid inhaler use without adequate mouth hygiene.  -Worsening burning in the chest and throat and epigastrium returned a month ago and becoming progressively worse.  - GI consult for EGD requested plans tentatively for possible EGD on 10/04/2024 (Eliquis remains on hold since 10/01/2024) -Continue Protonix  and Carafate - Apparently anesthesia was uncomfortable doing patient's EGD today as he required oxygen  overnight--- patient already being weaned off oxygen  as of 10/04/2023, EGD rescheduled for 10/05/2023 -- Hemoglobin stable above 10  2)H/o Lt Jugular Vein-- DVT--- Eliquis on hold as above #1 to allow for possible EGD  3) acute hypoxic respiratory failure--patient with COPD and lung cancer -Currently requiring 4 L of oxygen  via nasal -Bronchodilators and mucolytics as ordered - Respiratory status improving, hypoxia resolved as of 10/03/2024  -- Continue IV Solu-Medrol    4)CAP--RLL in a patient with history of  lung cancer status post radiation -Continue Rocephin/azithromycin , along with bronchodilators and mucolytics --Respiratory symptoms improving slowly --Oxygen  as above #3  5)Tobacco abuse--not ready to quit smoking at this time  6)DM2-anticipate worsening hyperglycemia with steroid Use Novolog /Humalog Sliding scale insulin  with Accu-Cheks/Fingersticks as ordered   7) hypermagnesemia--- 2.7 >>3.2,  -no magnesium  supplements and no medications that could potentially increase serum magnesium  level especially - Give Lasix  - Consider calcium  gluconate if serum magnesium  greater than 4 to limit risk for arrhythmia and neuromuscular issues  Status is: Inpatient   Disposition: The patient is from: Home              Anticipated d/c is to: Home              Anticipated d/c date is: 1 day              Patient currently is not medically stable to d/c. Barriers: Not Clinically Stable-   Code Status :  -  Code Status: Full Code   Family Communication:    NA (patient is alert, awake and coherent)   DVT Prophylaxis  :   - SCDs   Place and maintain sequential compression device Start: 10/01/24 1438   Lab Results  Component Value Date   PLT 251 10/03/2024    Inpatient Medications  Scheduled Meds:  allopurinol   100 mg Oral Daily   fluticasone  furoate-vilanterol  1 puff Inhalation Daily   folic acid   1 mg Oral Daily   furosemide   20 mg Intravenous Q12H   guaiFENesin   600 mg Oral BID   insulin  aspart  0-9 Units Subcutaneous TID WC   ipratropium-albuterol   3 mL Nebulization  Q6H WA   methylPREDNISolone  (SOLU-MEDROL ) injection  40 mg Intravenous Q12H   [START ON 10/05/2024] multivitamin with minerals  1 tablet Oral Daily   pantoprazole  (PROTONIX ) IV  40 mg Intravenous BID   sucralfate  1 g Oral TID WC & HS   tamsulosin   0.4 mg Oral QPC supper   thiamine   100 mg Oral Daily   Or   thiamine   100 mg Intravenous Daily   Continuous Infusions:  azithromycin  500 mg (10/03/24 9166)    cefTRIAXone (ROCEPHIN)  IV Stopped (10/02/24 2214)   PRN Meds:.acetaminophen  **OR** acetaminophen , bisacodyl, fentaNYL  (SUBLIMAZE ) injection, guaiFENesin -dextromethorphan, LORazepam  **OR** LORazepam , nicotine , ondansetron  **OR** ondansetron  (ZOFRAN ) IV, oxyCODONE , sodium chloride , traZODone   Anti-infectives (From admission, onward)    Start     Dose/Rate Route Frequency Ordered Stop   10/02/24 0800  azithromycin  (ZITHROMAX ) 500 mg in sodium chloride  0.9 % 250 mL IVPB        500 mg 250 mL/hr over 60 Minutes Intravenous Every 24 hours 10/01/24 1220 10/06/24 0759   10/01/24 2200  cefTRIAXone (ROCEPHIN) 2 g in sodium chloride  0.9 % 100 mL IVPB        2 g 200 mL/hr over 30 Minutes Intravenous Every 24 hours 10/01/24 1220 10/05/24 2159   10/01/24 0830  vancomycin  (VANCOREADY) IVPB 2000 mg/400 mL        2,000 mg 200 mL/hr over 120 Minutes Intravenous  Once 10/01/24 0816 10/01/24 1333   10/01/24 0815  ceFEPIme  (MAXIPIME ) 2 g in sodium chloride  0.9 % 100 mL IVPB        2 g 200 mL/hr over 30 Minutes Intravenous  Once 10/01/24 0809 10/01/24 0930   10/01/24 0815  metroNIDAZOLE  (FLAGYL ) IVPB 500 mg        500 mg 100 mL/hr over 60 Minutes Intravenous  Once 10/01/24 0809 10/01/24 1103   10/01/24 0815  vancomycin  (VANCOCIN ) IVPB 1000 mg/200 mL premix  Status:  Discontinued        1,000 mg 200 mL/hr over 60 Minutes Intravenous  Once 10/01/24 0809 10/01/24 9183       Subjective: Juan Merritt today has no fevers, no further emesis,  No chest pain,   - Had another brown soft BM - Weaned off oxygen  as of 10/03/2024 - Cough, wheezing and dyspnea on exertion continues to improve - Disappointed that he could not have his EGD today, EGD tentatively rescheduled for 10/04/2024  Objective: Vitals:   10/03/24 0237 10/03/24 0443 10/03/24 0824 10/03/24 0837  BP: 104/76 116/84    Pulse: 90 88    Resp:  20    Temp:  98.5 F (36.9 C)    TempSrc:  Axillary    SpO2:  98% 93% 93%  Weight:       Height:        Intake/Output Summary (Last 24 hours) at 10/03/2024 1505 Last data filed at 10/03/2024 0556 Gross per 24 hour  Intake 350 ml  Output --  Net 350 ml   Filed Weights   10/01/24 0750 10/01/24 1309  Weight: 112 kg 111.6 kg    Physical Exam  Gen:- Awake Alert,   HEENT:- [place].AT, No sclera icterus Neck-Supple Neck,No JVD,.  Lungs-  CTAB , fair symmetrical air movement CV- S1, S2 normal, regular  Abd-  +ve B.Sounds, Abd Soft, No tenderness,    Extremity/Skin:- No  edema, pedal pulses present  Psych-affect is appropriate, oriented x3 Neuro-no new focal deficits, no tremors  Data Reviewed: I have personally reviewed following labs and  imaging studies  CBC: Recent Labs  Lab 10/01/24 0822 10/02/24 0426 10/03/24 0409  WBC 9.6 8.6 5.9  NEUTROABS 8.2* 7.1 5.5  HGB 11.1* 10.6* 10.8*  HCT 32.8* 31.8* 32.8*  MCV 100.0 100.3* 100.6*  PLT 264 262 251   Basic Metabolic Panel: Recent Labs  Lab 10/01/24 0822 10/02/24 0426 10/03/24 0409  NA 132* 133* 137  K 3.9 3.4* 3.7  CL 93* 97* 100  CO2 29 26 28   GLUCOSE 126* 104* 208*  BUN 15 12 12   CREATININE 1.13 0.94 0.91  CALCIUM  9.4 8.9 9.2  MG  --  2.7* 3.2*  PHOS  --  3.1 4.3   GFR: Estimated Creatinine Clearance: 93.9 mL/min (by C-G formula based on SCr of 0.91 mg/dL). Liver Function Tests: Recent Labs  Lab 10/01/24 0822 10/02/24 0426 10/03/24 0409  AST 30 23 26   ALT 25 24 23   ALKPHOS 86 82 75  BILITOT 0.7 0.5 0.3  PROT 7.8 7.2 7.3  ALBUMIN 3.4* 3.1* 3.1*   Recent Results (from the past 240 hours)  Resp panel by RT-PCR (RSV, Flu A&B, Covid) Anterior Nasal Swab     Status: None   Collection Time: 10/01/24  8:04 AM   Specimen: Anterior Nasal Swab  Result Value Ref Range Status   SARS Coronavirus 2 by RT PCR NEGATIVE NEGATIVE Final    Comment: (NOTE) SARS-CoV-2 target nucleic acids are NOT DETECTED.  The SARS-CoV-2 RNA is generally detectable in upper respiratory specimens during the acute phase of  infection. The lowest concentration of SARS-CoV-2 viral copies this assay can detect is 138 copies/mL. A negative result does not preclude SARS-Cov-2 infection and should not be used as the sole basis for treatment or other patient management decisions. A negative result may occur with  improper specimen collection/handling, submission of specimen other than nasopharyngeal swab, presence of viral mutation(s) within the areas targeted by this assay, and inadequate number of viral copies(<138 copies/mL). A negative result must be combined with clinical observations, patient history, and epidemiological information. The expected result is Negative.  Fact Sheet for Patients:  BloggerCourse.com  Fact Sheet for Healthcare Providers:  SeriousBroker.it  This test is no t yet approved or cleared by the [place]  FDA and  has been authorized for detection and/or diagnosis of SARS-CoV-2 by FDA under an Emergency Use Authorization (EUA). This EUA will remain  in effect (meaning this test can be used) for the duration of the COVID-19 declaration under Section 564(b)(1) of the Act, 21 U.S.C.section 360bbb-3(b)(1), unless the authorization is terminated  or revoked sooner.       Influenza A by PCR NEGATIVE NEGATIVE Final   Influenza B by PCR NEGATIVE NEGATIVE Final    Comment: (NOTE) The Xpert Xpress SARS-CoV-2/FLU/RSV plus assay is intended as an aid in the diagnosis of influenza from Nasopharyngeal swab specimens and should not be used as a sole basis for treatment. Nasal washings and aspirates are unacceptable for Xpert Xpress SARS-CoV-2/FLU/RSV testing.  Fact Sheet for Patients: BloggerCourse.com  Fact Sheet for Healthcare Providers: SeriousBroker.it  This test is not yet approved or cleared by the [place]  FDA and has been authorized for detection and/or diagnosis of SARS-CoV-2  by FDA under an Emergency Use Authorization (EUA). This EUA will remain in effect (meaning this test can be used) for the duration of the COVID-19 declaration under Section 564(b)(1) of the Act, 21 U.S.C. section 360bbb-3(b)(1), unless the authorization is terminated or revoked.     Resp Syncytial Virus by PCR  NEGATIVE NEGATIVE Final    Comment: (NOTE) Fact Sheet for Patients: BloggerCourse.com  Fact Sheet for Healthcare Providers: SeriousBroker.it  This test is not yet approved or cleared by the [place]  FDA and has been authorized for detection and/or diagnosis of SARS-CoV-2 by FDA under an Emergency Use Authorization (EUA). This EUA will remain in effect (meaning this test can be used) for the duration of the COVID-19 declaration under Section 564(b)(1) of the Act, 21 U.S.C. section 360bbb-3(b)(1), unless the authorization is terminated or revoked.  Performed at [facility], [address] [place], [place] [address]   Culture, blood (Routine x 2)     Status: None (Preliminary result)   Collection Time: 10/01/24  8:22 AM   Specimen: BLOOD  Result Value Ref Range Status   Specimen Description BLOOD LEFT ANTECUBITAL  Final   Special Requests   Final    BOTTLES DRAWN AEROBIC AND ANAEROBIC Blood Culture adequate volume   Culture   Final    NO GROWTH 2 DAYS Performed at [facility], [address], [place], [place] [address]    Report Status PENDING  Incomplete  Culture, blood (Routine x 2)     Status: None (Preliminary result)   Collection Time: 10/01/24  8:22 AM   Specimen: BLOOD  Result Value Ref Range Status   Specimen Description BLOOD RIGHT ANTECUBITAL  Final   Special Requests   Final    BOTTLES DRAWN AEROBIC AND ANAEROBIC Blood Culture adequate volume   Culture   Final    NO GROWTH 2 DAYS Performed at [facility], [address], [place], [place] [address]    Report Status PENDING  Incomplete    Radiology  Studies: No results found.  Scheduled Meds:  allopurinol   100 mg Oral Daily   fluticasone  furoate-vilanterol  1 puff Inhalation Daily   folic acid   1 mg Oral Daily   furosemide   20 mg Intravenous Q12H   guaiFENesin   600 mg Oral BID   insulin  aspart  0-9 Units Subcutaneous TID WC   ipratropium-albuterol   3 mL Nebulization Q6H WA   methylPREDNISolone  (SOLU-MEDROL ) injection  40 mg Intravenous Q12H   [START ON 10/05/2024] multivitamin with minerals  1 tablet Oral Daily   pantoprazole  (PROTONIX ) IV  40 mg Intravenous BID   sucralfate  1 g Oral TID WC & HS   tamsulosin   0.4 mg Oral QPC supper   thiamine   100 mg Oral Daily   Or   thiamine   100 mg Intravenous Daily   Continuous Infusions:  azithromycin  500 mg (10/03/24 9166)   cefTRIAXone (ROCEPHIN)  IV Stopped (10/02/24 2214)     LOS: 2 days   Rendall Carwin M.D on 10/03/2024 at 3:05 PM  Go to [email or web address] - for contact info  [facility] - Office  [phone]  If 7PM-7AM, please contact night-coverage [email or web address] 10/03/2024, 3:05 PM

## 2024-10-03 NOTE — Progress Notes (Signed)
 10/03/2024 10:43 AM -----------------------------------------------------------[facility]--------------------------------------------------- D(Data) A(Action) R(response)     Data: Discharge Readiness Assessment EDD listed for tomorrow 10/04/2024    Action: Chart reviewed    Response: No immediate Barriers to discharge identified at this time pending post EGD pt. progress tomorrow am.     Merrick Feutz, RN [facility]

## 2024-10-03 NOTE — Plan of Care (Signed)

## 2024-10-04 ENCOUNTER — Inpatient Hospital Stay (HOSPITAL_COMMUNITY): Payer: Self-pay

## 2024-10-04 ENCOUNTER — Encounter (HOSPITAL_COMMUNITY)
Admission: EM | Disposition: A | Discharge status: Home / Self Care | Payer: Self-pay | Source: Home / Self Care | Attending: Family Medicine

## 2024-10-04 DIAGNOSIS — K222 Esophageal obstruction: Secondary | ICD-10-CM | POA: Diagnosis not present

## 2024-10-04 DIAGNOSIS — K449 Diaphragmatic hernia without obstruction or gangrene: Secondary | ICD-10-CM | POA: Diagnosis not present

## 2024-10-04 DIAGNOSIS — I10 Essential (primary) hypertension: Secondary | ICD-10-CM | POA: Diagnosis not present

## 2024-10-04 DIAGNOSIS — A419 Sepsis, unspecified organism: Secondary | ICD-10-CM | POA: Diagnosis not present

## 2024-10-04 DIAGNOSIS — K2289 Other specified disease of esophagus: Secondary | ICD-10-CM

## 2024-10-04 DIAGNOSIS — F17210 Nicotine dependence, cigarettes, uncomplicated: Secondary | ICD-10-CM

## 2024-10-04 DIAGNOSIS — R1310 Dysphagia, unspecified: Secondary | ICD-10-CM | POA: Diagnosis not present

## 2024-10-04 DIAGNOSIS — J189 Pneumonia, unspecified organism: Secondary | ICD-10-CM | POA: Diagnosis not present

## 2024-10-04 HISTORY — PX: ESOPHAGEAL DILATION: SHX303

## 2024-10-04 HISTORY — PX: ESOPHAGOGASTRODUODENOSCOPY: SHX5428

## 2024-10-04 LAB — COMPREHENSIVE METABOLIC PANEL WITH GFR
ALT: 28 U/L (ref 0–44)
AST: 29 U/L (ref 15–41)
Albumin: 3.1 g/dL — ABNORMAL LOW (ref 3.5–5.0)
Alkaline Phosphatase: 69 U/L (ref 38–126)
Anion gap: 12 (ref 5–15)
BUN: 18 mg/dL (ref 8–23)
CO2: 26 mmol/L (ref 22–32)
Calcium: 9.1 mg/dL (ref 8.9–10.3)
Chloride: 102 mmol/L (ref 98–111)
Creatinine, Ser: 0.97 mg/dL (ref 0.61–1.24)
GFR, Estimated: >60 mL/min (ref >60)
Glucose, Bld: 176 mg/dL — ABNORMAL HIGH (ref 70–99)
Potassium: 3.6 mmol/L (ref 3.5–5.1)
Sodium: 140 mmol/L (ref 135–145)
Total Bilirubin: 0.2 mg/dL (ref 0.0–1.2)
Total Protein: 7 g/dL (ref 6.5–8.1)

## 2024-10-04 LAB — CBC WITH DIFFERENTIAL/PLATELET
Abs Immature Granulocytes: 0.04 K/uL (ref 0.00–0.07)
Basophils Absolute: 0 K/uL (ref 0.0–0.1)
Basophils Relative: 0 %
Eosinophils Absolute: 0 K/uL (ref 0.0–0.5)
Eosinophils Relative: 0 %
HCT: 31.1 % — ABNORMAL LOW (ref 39.0–52.0)
Hemoglobin: 10.4 g/dL — ABNORMAL LOW (ref 13.0–17.0)
Immature Granulocytes: 1 %
Lymphocytes Relative: 4 %
Lymphs Abs: 0.4 K/uL — ABNORMAL LOW (ref 0.7–4.0)
MCH: 33.3 pg (ref 26.0–34.0)
MCHC: 33.4 g/dL (ref 30.0–36.0)
MCV: 99.7 fL (ref 80.0–100.0)
Monocytes Absolute: 0.2 K/uL (ref 0.1–1.0)
Monocytes Relative: 3 %
Neutro Abs: 7.8 K/uL — ABNORMAL HIGH (ref 1.7–7.7)
Neutrophils Relative %: 92 %
Platelets: 273 K/uL (ref 150–400)
RBC: 3.12 MIL/uL — ABNORMAL LOW (ref 4.22–5.81)
RDW: 12.3 % (ref 11.5–15.5)
WBC: 8.4 K/uL (ref 4.0–10.5)
nRBC: 0 % (ref 0.0–0.2)

## 2024-10-04 LAB — GLUCOSE, CAPILLARY
Glucose-Capillary: 132 mg/dL — ABNORMAL HIGH (ref 70–99)
Glucose-Capillary: 144 mg/dL — ABNORMAL HIGH (ref 70–99)

## 2024-10-04 LAB — PHOSPHORUS: Phosphorus: 3.7 mg/dL (ref 2.5–4.6)

## 2024-10-04 SURGERY — EGD (ESOPHAGOGASTRODUODENOSCOPY)
Anesthesia: General

## 2024-10-04 MED ORDER — SUCRALFATE 1 G PO TABS: 1.0000 g | ORAL_TABLET | Freq: Four times a day (QID) | ORAL | 0 refills | Status: DC

## 2024-10-04 MED ORDER — ACETAMINOPHEN 325 MG PO TABS: 650.0000 mg | ORAL_TABLET | Freq: Four times a day (QID) | ORAL | Status: AC | PRN

## 2024-10-04 MED ORDER — PANTOPRAZOLE SODIUM 40 MG PO TBEC: 40.0000 mg | DELAYED_RELEASE_TABLET | Freq: Two times a day (BID) | ORAL | 5 refills | Status: DC

## 2024-10-04 MED ORDER — PREDNISONE 20 MG PO TABS: 40.0000 mg | ORAL_TABLET | Freq: Every day | ORAL | 0 refills | Status: AC

## 2024-10-04 MED ORDER — LIDOCAINE 2% (20 MG/ML) 5 ML SYRINGE
INTRAMUSCULAR | Status: DC | PRN
  Administered 2024-10-04: 100 mg via INTRAVENOUS

## 2024-10-04 MED ORDER — VITAMIN B-1 100 MG PO TABS: 100.0000 mg | ORAL_TABLET | Freq: Every day | ORAL | 5 refills | Status: AC

## 2024-10-04 MED ORDER — AZITHROMYCIN 500 MG PO TABS: 500.0000 mg | ORAL_TABLET | Freq: Every day | ORAL | 0 refills | Status: AC

## 2024-10-04 MED ORDER — GUAIFENESIN ER 600 MG PO TB12: 600.0000 mg | ORAL_TABLET | Freq: Two times a day (BID) | ORAL | 0 refills | Status: DC

## 2024-10-04 MED ORDER — FOLIC ACID 400 MCG PO TABS: 400.0000 ug | ORAL_TABLET | Freq: Every day | ORAL | 5 refills | Status: AC

## 2024-10-04 MED ORDER — BUDESONIDE-FORMOTEROL FUMARATE 160-4.5 MCG/ACT IN AERO: 2.0000 | INHALATION_SPRAY | Freq: Two times a day (BID) | RESPIRATORY_TRACT | 3 refills | Status: AC

## 2024-10-04 MED ORDER — PROPOFOL 10 MG/ML IV BOLUS
INTRAVENOUS | Status: DC | PRN
  Administered 2024-10-04 (×6): 50 mg via INTRAVENOUS

## 2024-10-04 MED ORDER — STERILE WATER FOR IRRIGATION IR SOLN
Status: DC | PRN
  Administered 2024-10-04: 60 mL

## 2024-10-04 MED ORDER — SODIUM CHLORIDE 0.9 % IV SOLN: INTRAVENOUS | Status: DC

## 2024-10-04 MED ORDER — APIXABAN 5 MG PO TABS: 5.0000 mg | ORAL_TABLET | Freq: Two times a day (BID) | ORAL | 5 refills | Status: AC

## 2024-10-04 MED ORDER — FREESTYLE LIBRE 2 SENSOR MISC: 1.0000 [IU] | 4 refills | Status: AC

## 2024-10-04 MED ORDER — METFORMIN HCL ER 750 MG PO TB24: 750.0000 mg | ORAL_TABLET | Freq: Every day | ORAL | 3 refills | Status: DC

## 2024-10-04 MED ORDER — VITAMIN B-12 1000 MCG PO TABS: 1000.0000 ug | ORAL_TABLET | Freq: Every day | ORAL | 3 refills | Status: AC

## 2024-10-04 MED ORDER — IPRATROPIUM-ALBUTEROL 0.5-2.5 (3) MG/3ML IN SOLN: 3.0000 mL | Freq: Four times a day (QID) | RESPIRATORY_TRACT | 6 refills | Status: AC | PRN

## 2024-10-04 MED ORDER — LACTATED RINGERS IV SOLN: INTRAVENOUS | Status: DC

## 2024-10-04 NOTE — Care Management Important Message (Signed)
 Important Message  Patient Details  Name: Juan Merritt MRN: [id number] Date of Birth: 1951/05/02   Important Message Given:  Yes - Medicare IM     Laronda Lisby L Jahnia Hewes 10/04/2024, 10:57 AM

## 2024-10-04 NOTE — Discharge Summary (Signed)
 Juan Merritt, is a 73 y.o. male  DOB [birth year]  MRN [id number].  Admission date:  10/01/2024  Admitting Physician  Afton LITTIE Louder, MD  Discharge Date:  10/04/2024   Primary MD  Zollie Lowers, MD  Recommendations for primary care physician for things to follow:  1)Watch for bleeding while on Blood Thinners--watch for blood in your stool which can make your stool black, maroon, mahogany or red---, blood in your urine which can make your urine pink or red, nosebleeds , also watch for possible bruising -You are taking Apixaban/Eliquis--- which is a blood thinner--- be careful to avoid injury or falls  2)Avoid ibuprofen /Advil /Aleve/Motrin Josefine Powders/Naproxen/BC powders/Meloxicam /Diclofenac/Indomethacin  and other Nonsteroidal anti-inflammatory medications as these will make you more likely to bleed and can cause stomach ulcers, can also cause Kidney problems.   3) please note that there has been several changes to your medications  4) repeat CBC and BMP blood test to primary care physician in about 7 to 10 days advised  5) complete abstinence from alcohol and tobacco/nicotine  advised  Admission Diagnosis  Sepsis due to pneumonia (HCC) [J18.9, A41.9] Community acquired pneumonia of left lower lobe of lung [J18.9] Sepsis without acute organ dysfunction, due to unspecified organism (HCC) [A41.9] Gastroesophageal reflux disease, unspecified whether esophagitis present [K21.9]  Discharge Diagnosis  Sepsis due to pneumonia (HCC) [J18.9, A41.9] Community acquired pneumonia of left lower lobe of lung [J18.9] Sepsis without acute organ dysfunction, due to unspecified organism (HCC) [A41.9] Gastroesophageal reflux disease, unspecified whether esophagitis present [K21.9]    Principal Problem:   Sepsis due to pneumonia (HCC) Active Problems:   Chronic alcohol abuse   Odynophagia   COPD GOLD 2   Mixed  hyperlipidemia   Malignant neoplasm of upper lobe of right lung (HCC)   Dehydration   Weakness generalized   Type 2 diabetes mellitus with diabetic neuropathy, with long-term current use of insulin  (HCC)   Tobacco dependence   Radiation esophagitis   Hyponatremia   FTT (failure to thrive) in adult      Past Medical History:  Diagnosis Date   Arthritis    BPH associated with nocturia    Cancer (HCC)    stage III non-small cell lung cancer.   Chronic gout    COPD (chronic obstructive pulmonary disease) (HCC)    not on home o2   History of colon polyps    History of COVID-19    04/ 2019 hospital admission in epic, severe sepsis due to covid w/ acute respiratory failure;   and 05/ 2021 mild to moderate symptoms, recovered at home   Hyperlipidemia, mixed    Hypertension    Phimosis    Type 2 diabetes mellitus (HCC)    followed by pcp   (05-17-2022  pt stated checks sugar multiple times daily w/ Libre,  fasting average --- 100-110)    Past Surgical History:  Procedure Laterality Date   BRONCHIAL BIOPSY  01/22/2024   Procedure: BRONCHIAL BIOPSIES;  Surgeon: Shelah Lamar RAMAN, MD;  Location: [facility] ENDOSCOPY;  Service: Pulmonary;;  BRONCHIAL BRUSHINGS  01/22/2024   Procedure: BRONCHIAL BRUSHINGS;  Surgeon: Shelah Lamar RAMAN, MD;  Location: [facility] ENDOSCOPY;  Service: Pulmonary;;   BRONCHIAL NEEDLE ASPIRATION BIOPSY  01/22/2024   Procedure: BRONCHIAL NEEDLE ASPIRATION BIOPSIES;  Surgeon: Shelah Lamar RAMAN, MD;  Location: [facility] ENDOSCOPY;  Service: Pulmonary;;   CIRCUMCISION N/A 05/19/2022   Procedure: CIRCUMCISION ADULT;  Surgeon: Matilda Senior, MD;  Location: [facility];  Service: Urology;  Laterality: N/A;  45 MINUTES   COLONOSCOPY  12/01/2021   by dr legrand   ENDOBRONCHIAL ULTRASOUND  01/22/2024   Procedure: ENDOBRONCHIAL ULTRASOUND;  Surgeon: Shelah Lamar RAMAN, MD;  Location: [facility] ENDOSCOPY;  Service: Pulmonary;;   FINE NEEDLE ASPIRATION  01/22/2024   Procedure: FINE NEEDLE  ASPIRATION (FNA) LINEAR;  Surgeon: Shelah Lamar RAMAN, MD;  Location: [facility] ENDOSCOPY;  Service: Pulmonary;;   LAPAROSCOPIC CHOLECYSTECTOMY  2008   in [place]   LEFT HEART CATH     TONSILLECTOMY     child   VIDEO BRONCHOSCOPY WITH RADIAL ENDOBRONCHIAL ULTRASOUND  01/22/2024   Procedure: VIDEO BRONCHOSCOPY WITH RADIAL ENDOBRONCHIAL ULTRASOUND;  Surgeon: Shelah Lamar RAMAN, MD;  Location: [facility] ENDOSCOPY;  Service: Pulmonary;;     HPI  from the history and physical done on the day of admission:   Chief Complaint: abd and chest pain    HPI: Juan Merritt is a 73 year old male with history of 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  He has been suffering with issues of severe GERD and esophagitis symptoms since radiation therapy and reportedly in the past 3 days has not been able to eat or drink.  He is an regular daily beer drinker and has not been able to drink beer in the last 3 days.  He has been trying to follow an anti-reflux diet but nothing seems to help his GERD symptoms.  He was prescribed a xylocaine  magic mouthwash by his PCP but when he takes this the symptoms are only relieved for about 45 mins.  He had been referred to GI by his oncologist but the appointment is not until November 2025.  He presented to the emergency department with abdominal pain and burning in the chest and abdomen.  He says that it feels like fire.  He has been on PPI therapy but not improving.  He presented with sepsis physiology with a fever of 101.5 and tachycardia.  His infection workup positive for left lower lobe pneumonia as a likely cause of sepsis.  Right upper lobe pulmonary nodule smaller than on prior examinations.  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  Patient is being admitted for further management.   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  He has been suffering with issues of severe GERD and esophagitis symptoms since radiation therapy admitted on 10/01/2024 with concerns for severe esophagitis and possible pneumonia   -Assessment and Plan: -1) esophagitis with odynophagia  --recent history of radiation and chemo for lung cancer, completed April 2025.  -Frequent regular alcohol use and current daily 1 ppd smoker  -Recent antibiotics 2 weeks ago and regular steroid inhaler use without adequate mouth hygiene.  -Worsening burning  in the chest and throat and epigastrium returned a month ago and becoming progressively worse.  - GI consult for EGD appreciated - EGD on 10/04/24 showed--Benign-appearing esophageal stenosis.  Dilated.  Normal esophagus biopsied.  - 2 cm hiatal hernia.  - Normal stomach.  - Normal examined duodenum.   -Continue Protonix  and Carafate Hemoglobin stable above 10   2)H/o Lt Jugular Vein-- DVT--- Eliquis on hold as above #1 to allow for possible EGD   3) acute hypoxic respiratory failure--patient with COPD and lung cancer -Required up to  4 L of oxygen  via nasal -Continue with IV Solu-Medrol  as well as-Bronchodilators and mucolytics as ordered - Hypoxia resolved - O2 sats on room air is 95 to 96% at this time   4)CAP--RLL in a patient with history of lung cancer status post radiation - Much improved after treatment with Rocephin/azithromycin , along with bronchodilators and mucolytics, as well as steroids -Hypoxia resolved as noted above #3 -Discharge on bronchodilators, azithromycin  and prednisone    5)Tobacco abuse--not ready to quit smoking at this time   6)DM2-anticipate worsening hyperglycemia with steroid .-A1c 5.6  reflecting excellent diabetic control PTA -Restart metformin    7) hypermagnesemia--- 2.7 >>3.2,  -no magnesium  supplements and no medications that could potentially increase serum magnesium  level especially - Gave Lasix  - Consider calcium  gluconate if serum magnesium  greater than 4 to limit risk for arrhythmia and neuromuscular issues -   Disposition: The patient is from: Home              Anticipated d/c is to: Home   Discharge Condition: Stable without hypoxia  Follow UP   Follow-up Information     Zollie Lowers, MD. Schedule an appointment as soon as possible for a visit in 2 day(s).   Specialty: Family Medicine Contact information: [address] [place] [address] [phone]                 Consults obtained -GI for EGD  Diet and Activity recommendation:  As advised  Discharge Instructions    Discharge Instructions     Call MD for:  difficulty breathing, headache or visual disturbances   Complete by: As directed    Call MD for:  persistant dizziness or light-headedness   Complete by: As directed    Call MD for:  persistant nausea and vomiting   Complete by: As directed    Call MD for:  temperature >100.4   Complete by: As directed    Diet - low sodium heart healthy   Complete by: As directed    Diet Carb Modified   Complete by: As directed    Discharge instructions   Complete by: As directed    1)Watch for bleeding while on Blood Thinners--watch for blood in your stool which can make your stool black, maroon, mahogany or red---, blood in your urine which can make your urine pink or red, nosebleeds , also watch for possible bruising -You are taking Apixaban/Eliquis--- which is a blood thinner--- be careful to avoid injury or falls  2)Avoid ibuprofen /Advil /Aleve/Motrin /Goody Powders/Naproxen/BC powders/Meloxicam /Diclofenac/Indomethacin  and other Nonsteroidal anti-inflammatory medications as these will make you more likely to bleed and can cause  stomach ulcers, can also cause Kidney problems.   3) please note that there has been several changes to your medications  4) repeat CBC and BMP blood test to primary care physician in about 7 to 10 days advised  5) complete abstinence from alcohol and tobacco/nicotine  advised   Increase activity slowly   Complete by:  As directed         Discharge Medications     Allergies as of 10/04/2024   No Known Allergies      Medication List     STOP taking these medications    Eliquis DVT/PE Starter Pack Generic drug: Apixaban Starter Pack (10mg  and 5mg ) Replaced by: apixaban 5 MG Tabs tablet       TAKE these medications    acetaminophen  325 MG tablet Commonly known as: TYLENOL  Take 2 tablets (650 mg total) by mouth every 6 (six) hours as needed for mild pain (pain score 1-3) or fever (or Fever >/= 101). What changed:  medication strength how much to take when to take this reasons to take this   albuterol  108 (90 Base) MCG/ACT inhaler Commonly known as: VENTOLIN  HFA Inhale 2 puffs into the lungs every 4 (four) hours as needed for wheezing or shortness of breath. What changed: See the new instructions.   allopurinol  100 MG tablet Commonly known as: ZYLOPRIM  TAKE 1 TABLET (100 MG TOTAL) BY MOUTH DAILY. TO PREVENT GOUT   apixaban 5 MG Tabs tablet Commonly known as: ELIQUIS Take 1 tablet (5 mg total) by mouth 2 (two) times daily. Replaces: Eliquis DVT/PE Starter Pack   azithromycin  500 MG tablet Commonly known as: ZITHROMAX  Take 1 tablet (500 mg total) by mouth daily for 3 days.   budesonide -formoterol  160-4.5 MCG/ACT inhaler Commonly known as: Symbicort  Inhale 2 puffs into the lungs 2 (two) times daily. What changed:  how much to take how to take this when to take this additional instructions   cyanocobalamin  1000 MCG tablet Commonly known as: VITAMIN B12 Take 1 tablet (1,000 mcg total) by mouth daily.   folic acid  400 MCG tablet Commonly known as:  Folate Take 1 tablet (400 mcg total) by mouth daily.   FreeStyle Libre 2 Sensor Misc Apply 1 Units topically once a week. USE TO TEST BLOOD SUGAR CONTINUOUSLY. Dx. E11.65 SEND TO [facility] FOR MEDICARE COVERAGE, DO NOT SEND TO LOCAL PHARMACY What changed:  how much to take how to take this when to take this   guaiFENesin  600 MG 12 hr tablet Commonly known as: MUCINEX  Take 1 tablet (600 mg total) by mouth 2 (two) times daily.   ipratropium-albuterol  0.5-2.5 (3) MG/3ML Soln Commonly known as: DUONEB Take 3 mLs by nebulization every 6 (six) hours as needed.   lidocaine  2 % solution Commonly known as: XYLOCAINE  Use as directed 15 mLs in the mouth or throat.   metFORMIN  750 MG 24 hr tablet Commonly known as: GLUCOPHAGE -XR Take 1 tablet (750 mg total) by mouth daily with breakfast.   pantoprazole  40 MG tablet Commonly known as: PROTONIX  Take 1 tablet (40 mg total) by mouth 2 (two) times daily. For stomach What changed: when to take this   potassium chloride  SA 20 MEQ tablet Commonly known as: Klor-Con  M20 TAKE 1 TABLET (20 MEQ TOTAL) DAILY BY MOUTH. What changed:  how much to take how to take this when to take this additional instructions   predniSONE  20 MG tablet Commonly known as: DELTASONE  Take 2 tablets (40 mg total) by mouth daily with breakfast for 5 days.   PROBIOTIC ACIDOPHILUS PO Take 1 capsule by mouth daily.   sucralfate 1 g tablet Commonly known as: Carafate Take 1 tablet (1 g total) by mouth 4 (four) times daily.   tamsulosin  0.4 MG Caps capsule Commonly known as: FLOMAX  TAKE 2 CAPSULES BY MOUTH AT BEDTIME FOR URINE FLOW AND PROSTATE  thiamine  100 MG tablet Commonly known as: Vitamin B-1 Take 1 tablet (100 mg total) by mouth daily. Start taking on: October 05, 2024   torsemide  100 MG tablet Commonly known as: DEMADEX  Take 100 mg by mouth 2 (two) times daily.        Major procedures and Radiology Reports - PLEASE review detailed and  final reports for all details, in brief -   CT CHEST ABDOMEN PELVIS W CONTRAST Result Date: 10/01/2024 CLINICAL DATA:  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 Completed radiation therapy and chemotherapy 04/15/2024 on maintenance pembrolizumab  immunotherapy. Sepsis with chest and abdominal pain with eating/drinking. * Tracking Code: BO * EXAM: CT CHEST, ABDOMEN, AND PELVIS WITH CONTRAST TECHNIQUE: Multidetector CT imaging of the chest, abdomen and pelvis was performed following the standard protocol during bolus administration of intravenous contrast. RADIATION DOSE REDUCTION: This exam was performed according to the departmental dose-optimization program which includes automated exposure control, adjustment of the mA and/or kV according to patient size and/or use of iterative reconstruction technique. CONTRAST:  OMNIPAQUE  IOHEXOL  300 MG/ML  SOLN COMPARISON:  03/31/2024 FINDINGS: CT CHEST FINDINGS Cardiovascular: Left Port-A-Cath tip: SVC. Coronary, aortic arch, and branch vessel atherosclerotic vascular disease. Small anterior pericardial effusion, new compared to 03/31/2024. Mediastinum/Nodes: Subtle stranding in the mediastinum particularly around the trachea and tracheoesophageal groove. Small hilar, subcarinal, and paratracheal lymph nodes are not pathologically enlarged by size criteria. Lungs/Pleura: The right upper lobe pulmonary nodule measures 2.4 by 1.2 cm on image 30 series 3, formerly 3.3 by 1.8 cm on 03/31/2024. Previous cavitary component no longer present. Bandlike density extending around the nodule and below the nodule in the right upper lobe is likely attributable to radiation pneumonitis. Right upper lobe peribronchovascular volume loss with substantial bilateral airway thickening worsened from previous, with some airway plugging particularly in the left lower lobe. The airway thickening causes luminal narrowing. Musculoskeletal: Mild thoracic  spondylosis. CT ABDOMEN PELVIS FINDINGS Hepatobiliary: Faint transient hepatic attenuation difference in segment 4 of the liver without definite underlying lesion identified. Gallbladder surgically absent. No biliary dilatation. Pancreas: Unremarkable Spleen: Unremarkable Adrenals/Urinary Tract: Unremarkable Stomach/Bowel: There are few scattered sigmoid colon diverticula. No dilated bowel. No specific gastric or duodenal abnormality observed. Formed stool noted in the colon. Vascular/Lymphatic: Atherosclerosis is present, including aortoiliac atherosclerotic disease. Atheromatous plaque noted proximally in the celiac trunk and SMA but not causing occlusion or high-grade stenosis. Reproductive: Unremarkable Other: No supplemental non-categorized findings. Musculoskeletal: Lumbar spondylosis and degenerative disc disease, causing bilateral moderate foraminal impingement at L5-S1. IMPRESSION: 1. The right upper lobe pulmonary nodule is smaller than on the prior exam, currently 2.4 by 1.2 cm, formerly 3.3 by 1.8 cm. Previous cavitary component no longer present. 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 2. Tracheal and bilateral airway thickening with some airway plugging particularly in the left lower lobe. Infectious etiology favored given the diffuse pattern, a component of reactive airways disease is not excluded. 3. Small anterior pericardial effusion, new compared to 03/31/2024. 4. Subtle stranding in the mediastinum particularly around the trachea and tracheoesophageal groove, likely inflammatory. 5. Lumbar spondylosis and degenerative disc disease causing bilateral moderate foraminal impingement at L5-S1. 6. Aortic Atherosclerosis (ICD10-I70.0). Systemic and coronary atherosclerosis. Electronically Signed   By: Ryan Salvage M.D.   On: 10/01/2024 10:56   DG Chest Portable 1 View Result Date: 10/01/2024 EXAM: 1 VIEW(S) XRAY OF THE CHEST 10/01/2024 08:05:00 AM COMPARISON: Prior study. CLINICAL  HISTORY: cp. Per triage; Pt in by POV  for abd pain / burning. Pt states this is his 3rd ER visit in the last 2 weeks for this pain. Pt can not eat or drink without a fire burning sensation in his chest and abdomen. Pt has had this ongoing since his cancer radiation in April. Pt ; can not get in to see GI until November. FINDINGS: LINES, TUBES AND DEVICES: Left IJ port catheter to the SVC. LUNGS AND PLEURA: Improved aeration in the lung bases since prior study. No focal pulmonary opacity. No pulmonary edema. No pleural effusion. No pneumothorax. HEART AND MEDIASTINUM: Aortic atherosclerosis. No acute abnormality of the cardiac and mediastinal silhouettes. BONES AND SOFT TISSUES: No acute osseous abnormality. IMPRESSION: 1. No acute cardiopulmonary process. 2. Improved aeration in the lung bases. Electronically signed by: Katheleen Faes MD 10/01/2024 08:09 AM EDT RP Workstation: [id number]   Micro Results  Recent Results (from the past 240 hours)  Resp panel by RT-PCR (RSV, Flu A&B, Covid) Anterior Nasal Swab     Status: None   Collection Time: 10/01/24  8:04 AM   Specimen: Anterior Nasal Swab  Result Value Ref Range Status   SARS Coronavirus 2 by RT PCR NEGATIVE NEGATIVE Final    Comment: (NOTE) SARS-CoV-2 target nucleic acids are NOT DETECTED.  The SARS-CoV-2 RNA is generally detectable in upper respiratory specimens during the acute phase of infection. The lowest concentration of SARS-CoV-2 viral copies this assay can detect is 138 copies/mL. A negative result does not preclude SARS-Cov-2 infection and should not be used as the sole basis for treatment or other patient management decisions. A negative result may occur with  improper specimen collection/handling, submission of specimen other than nasopharyngeal swab, presence of viral mutation(s) within the areas targeted by this assay, and inadequate number of viral copies(<138 copies/mL). A negative result must be combined with clinical  observations, patient history, and epidemiological information. The expected result is Negative.  Fact Sheet for Patients:  bloggercourse.com  Fact Sheet for Healthcare Providers:  seriousbroker.it  This test is no t yet approved or cleared by the [place]  FDA and  has been authorized for detection and/or diagnosis of SARS-CoV-2 by FDA under an Emergency Use Authorization (EUA). This EUA will remain  in effect (meaning this test can be used) for the duration of the COVID-19 declaration under Section 564(b)(1) of the Act, 21 U.S.C.section 360bbb-3(b)(1), unless the authorization is terminated  or revoked sooner.       Influenza A by PCR NEGATIVE NEGATIVE Final   Influenza B by PCR NEGATIVE NEGATIVE Final    Comment: (NOTE) The Xpert Xpress SARS-CoV-2/FLU/RSV plus assay is intended as an aid in the diagnosis of influenza from Nasopharyngeal swab specimens and should not be used as a sole basis for treatment. Nasal washings and aspirates are unacceptable for Xpert Xpress SARS-CoV-2/FLU/RSV testing.  Fact Sheet for Patients: bloggercourse.com  Fact Sheet for Healthcare Providers: seriousbroker.it  This test is not yet approved or cleared by the [place]  FDA and has been authorized for detection and/or diagnosis of SARS-CoV-2 by FDA under an Emergency Use Authorization (EUA). This EUA will remain in effect (meaning this test can be used) for the duration of the COVID-19 declaration under Section 564(b)(1) of the Act, 21 U.S.C. section 360bbb-3(b)(1), unless the authorization is terminated or revoked.     Resp Syncytial Virus by PCR NEGATIVE NEGATIVE Final    Comment: (NOTE) Fact Sheet for Patients: bloggercourse.com  Fact Sheet for Healthcare Providers: seriousbroker.it  This test is not yet approved or  cleared by  the [place]  FDA and has been authorized for detection and/or diagnosis of SARS-CoV-2 by FDA under an Emergency Use Authorization (EUA). This EUA will remain in effect (meaning this test can be used) for the duration of the COVID-19 declaration under Section 564(b)(1) of the Act, 21 U.S.C. section 360bbb-3(b)(1), unless the authorization is terminated or revoked.  Performed at [facility], [address] [place], [place] [address]   Culture, blood (Routine x 2)     Status: None (Preliminary result)   Collection Time: 10/01/24  8:22 AM   Specimen: BLOOD  Result Value Ref Range Status   Specimen Description BLOOD LEFT ANTECUBITAL  Final   Special Requests   Final    BOTTLES DRAWN AEROBIC AND ANAEROBIC Blood Culture adequate volume   Culture   Final    NO GROWTH 3 DAYS Performed at [facility], [address] [place], [place] [address]    Report Status PENDING  Incomplete  Culture, blood (Routine x 2)     Status: None (Preliminary result)   Collection Time: 10/01/24  8:22 AM   Specimen: BLOOD  Result Value Ref Range Status   Specimen Description BLOOD RIGHT ANTECUBITAL  Final   Special Requests   Final    BOTTLES DRAWN AEROBIC AND ANAEROBIC Blood Culture adequate volume   Culture   Final    NO GROWTH 3 DAYS Performed at [facility], [address] [place], [place] [address]    Report Status PENDING  Incomplete    Today   Subjective    Lonell Asal today has no new complaints  No fever  Or chills   No Nausea, Vomiting or Diarrhea - Dyspnea improved significantly - Hypoxia resolved - Tolerated EGD well - Eating and drinking okay after EGD       Patient has been seen and examined prior to discharge   Objective   Blood pressure 119/80, pulse (!) 103, temperature 97.9 F (36.6 C), temperature source Oral, resp. rate 20, height 5' 10.5 (1.791 m), weight 111.6 kg, SpO2 96%.   Intake/Output Summary (Last 24 hours) at 10/04/2024 1325 Last data filed at  10/04/2024 0856 Gross per 24 hour  Intake 550 ml  Output --  Net 550 ml    Exam Gen:- Awake Alert, no acute distress  HEENT:- [place].AT, No sclera icterus Neck-Supple Neck,No JVD,.  Lungs--improving air movement, no wheezing CV- S1, S2 normal, regular Abd-  +ve B.Sounds, Abd Soft, No tenderness,    Extremity/Skin:- No  edema,   good pulses Psych-affect is appropriate, oriented x3 Neuro-no new focal deficits, no tremors    Data Review   CBC w Diff:  Lab Results  Component Value Date   WBC 8.4 10/04/2024   HGB 10.4 (L) 10/04/2024   HGB 12.4 (L) 09/11/2024   HCT 31.1 (L) 10/04/2024   HCT 36.9 (L) 09/11/2024   PLT 273 10/04/2024   PLT 213 09/11/2024   LYMPHOPCT 4 10/04/2024   MONOPCT 3 10/04/2024   EOSPCT 0 10/04/2024   BASOPCT 0 10/04/2024   CMP:  Lab Results  Component Value Date   NA 140 10/04/2024   NA 137 09/11/2024   K 3.6 10/04/2024   CL 102 10/04/2024   CO2 26 10/04/2024   BUN 18 10/04/2024   BUN 14 09/11/2024   CREATININE 0.97 10/04/2024   CREATININE 1.19 04/04/2024   PROT 7.0 10/04/2024   PROT 6.8 08/07/2024   ALBUMIN 3.1 (L) 10/04/2024   ALBUMIN 3.8 08/07/2024   BILITOT  0.2 10/04/2024   BILITOT 0.5 08/07/2024   ALKPHOS 69 10/04/2024   AST 29 10/04/2024   ALT 28 10/04/2024   Total Discharge time is about 33 minutes  Rendall Carwin M.D on 10/04/2024 at 1:25 PM  Go to [email or web address] -  for contact info  [facility] - Office  [phone]

## 2024-10-04 NOTE — Op Note (Addendum)
 [facility] Patient Name: Juan Merritt Procedure Date: 10/04/2024 8:14 AM MRN: [id number] Date of Birth: 08-14-1951 Attending MD: Toribio Fortune , , [id number] CSN: [id number] Age: 73 Admit Type: Outpatient Procedure:                Upper GI endoscopy Indications:              Dysphagia, Odynophagia Providers:                Toribio Fortune, Rosina Sprague, Madelin Hunter, RN Referring MD:              Medicines:                Monitored Anesthesia Care Complications:            No immediate complications. Estimated Blood Loss:     Estimated blood loss: none. Procedure:                Pre-Anesthesia Assessment:                           - Prior to the procedure, a History and Physical                            was performed, and patient medications, allergies                            and sensitivities were reviewed. The patient's                            tolerance of previous anesthesia was reviewed.                           - The risks and benefits of the procedure and the                            sedation options and risks were discussed with the                            patient. All questions were answered and informed                            consent was obtained.                           After obtaining informed consent, the endoscope was                            passed under direct vision. Throughout the                            procedure, the patient's blood pressure, pulse, and                            oxygen  saturations were monitored continuously. The                            [id number] ([id number]) Upper  was introduced through                            the mouth, and advanced to the second part of                            duodenum. The upper GI endoscopy was accomplished                            without difficulty. The patient tolerated the                            procedure well. Scope In: 8:39:58 AM Scope Out: 8:51:52 AM Total Procedure  Duration: 0 hours 11 minutes 54 seconds  Findings:      One benign-appearing, intrinsic mild (non-circumferential scarring)       stenosis was found in the distal esophagus. This stenosis measured 1.6       cm (inner diameter) x less than one cm (in length). The stenosis was       traversed. A cold forceps was used to disrupt the rim of the stenosis. A       TTS dilator was passed through the scope. Dilation with a 15-16.5-18 mm       balloon dilator was performed to 18 mm. Biopsies from the rest of the       esophagus were taken with a cold forceps for histology, CMV and HSV       staining.      The Z-line was irregular and was found 40 cm from the incisors. Biopsies       were taken with a cold forceps for histology.      A 2 cm hiatal hernia was present.      The stomach was normal.      The examined duodenum was normal. Impression:               - Benign-appearing esophageal stenosis. Dilated.                            Normal esophagus biopsied.                           - Z-line irregular, 40 cm from the incisors.                            Biopsied.                           - 2 cm hiatal hernia.                           - Normal stomach.                           - Normal examined duodenum. Moderate Sedation:      Per Anesthesia Care Recommendation:           - Return patient to hospital ward for possible  discharge same day.                           - Resume previous diet.                           - Use Protonix  (pantoprazole ) 40 mg PO BID for 2                            months, then may go down to once a day.                           - Use sucralfate tablets 1 gram PO BID for 1 month.                           - Await pathology results.                           - Smoking and alcohol cessation.                           - Follow up in GI clinic on 11/04/2024.                           - Restart Eliquis tonight. Procedure Code(s):        ---  Professional ---                           [phone], Esophagogastroduodenoscopy, flexible,                            transoral; with transendoscopic balloon dilation of                            esophagus (less than 30 mm diameter)                           43239, 59, Esophagogastroduodenoscopy, flexible,                            transoral; with biopsy, single or multiple Diagnosis Code(s):        --- Professional ---                           K22.2, Esophageal obstruction                           K44.9, Diaphragmatic hernia without obstruction or                            gangrene                           R13.10, Dysphagia, unspecified CPT copyright 2022 American Medical Association. All rights reserved. The codes documented in this report are preliminary and upon coder review may  be revised to meet current compliance requirements. Toribio Fortune,  MD Toribio Fortune,  10/04/2024 9:02:29 AM This report has been signed electronically. Number of Addenda: 0

## 2024-10-04 NOTE — Progress Notes (Signed)

## 2024-10-04 NOTE — Discharge Instructions (Signed)
 1)Watch for bleeding while on Blood Thinners--watch for blood in your stool which can make your stool black, maroon, mahogany or red---, blood in your urine which can make your urine pink or red, nosebleeds , also watch for possible bruising -You are taking Apixaban/Eliquis--- which is a blood thinner--- be careful to avoid injury or falls  2)Avoid ibuprofen /Advil /Aleve/Motrin /Goody Powders/Naproxen/BC powders/Meloxicam /Diclofenac/Indomethacin  and other Nonsteroidal anti-inflammatory medications as these will make you more likely to bleed and can cause stomach ulcers, can also cause Kidney problems.   3) please note that there has been several changes to your medications  4) repeat CBC and BMP blood test to primary care physician in about 7 to 10 days advised  5) complete abstinence from alcohol and tobacco/nicotine  advised

## 2024-10-04 NOTE — Anesthesia Postprocedure Evaluation (Signed)
 Anesthesia Post Note  Patient: Juan Merritt  Procedure(s) Performed: EGD (ESOPHAGOGASTRODUODENOSCOPY) DILATION, ESOPHAGUS  Patient location during evaluation: Phase II Anesthesia Type: General Level of consciousness: awake Pain management: pain level controlled Vital Signs Assessment: post-procedure vital signs reviewed and stable Respiratory status: spontaneous breathing and respiratory function stable Cardiovascular status: blood pressure returned to baseline and stable Postop Assessment: no headache and no apparent nausea or vomiting Anesthetic complications: no Comments: Late entry   No notable events documented.   Last Vitals:  Vitals:   10/04/24 0915 10/04/24 0925  BP: 104/68 119/80  Pulse: 100 (!) 103  Resp: 19 20  Temp:  36.6 C  SpO2: 95% 96%    Last Pain:  Vitals:   10/04/24 0925  TempSrc: Oral  PainSc:                  Yvonna JINNY Bosworth

## 2024-10-04 NOTE — Plan of Care (Signed)

## 2024-10-04 NOTE — Brief Op Note (Addendum)
 10/04/2024  9:02 AM  PATIENT:  Juan Merritt  72 y.o. male  PRE-OPERATIVE DIAGNOSIS:  odynophagia/dysphagia, chest burning/throat burning  POST-OPERATIVE DIAGNOSIS:  hiatal hernia, esophgeal ring  PROCEDURE:  Procedure(s): EGD (ESOPHAGOGASTRODUODENOSCOPY) (N/A) DILATION, ESOPHAGUS (N/A)  SURGEON:  Surgeons and Role:    * Eartha Angelia Sieving, MD - Primary  Patient underwent EGD under propofol  sedation.  Tolerated the procedure adequately.   FINDINGS: - Benign-appearing esophageal stenosis.  Dilated.  Normal esophagus biopsied.  - 2 cm hiatal hernia.  - Normal stomach.  - Normal examined duodenum.   RECOMMENDATIONS - Return patient to hospital ward for possible discharge same day.  - Resume previous diet.  - Use Protonix  (pantoprazole ) 40 mg PO BID for 2 months, then may go down to once a day. - Use sucralfate tablets 1 gram PO BID for 1 month.  - Await pathology results.  - Smoking and alcohol cessation. - Follow up in GI clinic on 11/04/2024. - Restart Eliquis tonight. - GI service will sign-off, please call us  back if you have any more questions.   Sieving Eartha, MD Gastroenterology and Hepatology [facility]

## 2024-10-04 NOTE — Transfer of Care (Signed)
 Immediate Anesthesia Transfer of Care Note  Patient: Juan Merritt  Procedure(s) Performed: EGD (ESOPHAGOGASTRODUODENOSCOPY) DILATION, ESOPHAGUS  Patient Location: PACU  Anesthesia Type:General  Level of Consciousness: awake, alert , and patient cooperative  Airway & Oxygen  Therapy: Patient Spontanous Breathing  Post-op Assessment: Report given to RN, Post -op Vital signs reviewed and stable, and Patient moving all extremities X 4  Post vital signs: Reviewed and stable  Last Vitals:  Vitals Value Taken Time  BP 115/85 10/04/24 09:00  Temp    Pulse 107 10/04/24 09:02  Resp 31 10/04/24 09:02  SpO2 96 % 10/04/24 09:02  Vitals shown include unfiled device data.  Last Pain:  Vitals:   10/04/24 0833  TempSrc:   PainSc: 0-No pain      Patients Stated Pain Goal: 0 (10/02/24 2030)  Complications: No notable events documented.

## 2024-10-04 NOTE — Anesthesia Preprocedure Evaluation (Signed)
 Anesthesia Evaluation  Patient identified by MRN, date of birth, ID band Patient awake    Reviewed: Allergy & Precautions, H&P , NPO status , Patient's Chart, lab work & pertinent test results, reviewed documented beta blocker date and time   Airway Mallampati: II  TM Distance: >3 FB Neck ROM: full    Dental no notable dental hx.    Pulmonary neg pulmonary ROS, pneumonia, COPD, Current Smoker and Patient abstained from smoking.   Pulmonary exam normal breath sounds clear to auscultation       Cardiovascular Exercise Tolerance: Good hypertension, negative cardio ROS  Rhythm:regular Rate:Normal     Neuro/Psych  PSYCHIATRIC DISORDERS      negative neurological ROS  negative psych ROS   GI/Hepatic negative GI ROS, Neg liver ROS,,,  Endo/Other  negative endocrine ROSdiabetes    Renal/GU negative Renal ROS  negative genitourinary   Musculoskeletal   Abdominal   Peds  Hematology negative hematology ROS (+)   Anesthesia Other Findings   Reproductive/Obstetrics negative OB ROS                              Anesthesia Physical Anesthesia Plan  ASA: 3  Anesthesia Plan: General   Post-op Pain Management:    Induction:   PONV Risk Score and Plan: Propofol  infusion  Airway Management Planned:   Additional Equipment:   Intra-op Plan:   Post-operative Plan:   Informed Consent: I have reviewed the patients History and Physical, chart, labs and discussed the procedure including the risks, benefits and alternatives for the proposed anesthesia with the patient or authorized representative who has indicated his/her understanding and acceptance.     Dental Advisory Given  Plan Discussed with: CRNA  Anesthesia Plan Comments:         Anesthesia Quick Evaluation

## 2024-10-06 LAB — CULTURE, BLOOD (ROUTINE X 2)
Culture: NO GROWTH
Culture: NO GROWTH
Special Requests: ADEQUATE
Special Requests: ADEQUATE

## 2024-10-07 ENCOUNTER — Ambulatory Visit (INDEPENDENT_AMBULATORY_CARE_PROVIDER_SITE_OTHER): Payer: Self-pay | Admitting: Gastroenterology

## 2024-10-07 ENCOUNTER — Telehealth: Payer: Self-pay

## 2024-10-07 LAB — SURGICAL PATHOLOGY

## 2024-10-07 NOTE — Transitions of Care (Post Inpatient/ED Visit) (Signed)
   10/07/2024  Name: Juan Merritt MRN: [id number] DOB: 1951/06/05  Today's TOC FU Call Status: Today's TOC FU Call Status:: Unsuccessful Call (1st Attempt) Unsuccessful Call (1st Attempt) Date: 10/07/24  Attempted to reach the patient regarding the most recent Inpatient/ED visit.  Follow Up Plan: Additional outreach attempts will be made to reach the patient to complete the Transitions of Care (Post Inpatient/ED visit) call.   Alan Ee, RN, BSN, CEN [employer]- Transition of Care Team.  [facility] [phone]

## 2024-10-08 ENCOUNTER — Encounter (HOSPITAL_COMMUNITY): Payer: Self-pay | Admitting: Gastroenterology

## 2024-10-09 ENCOUNTER — Telehealth: Payer: Self-pay | Admitting: *Deleted

## 2024-10-09 NOTE — Transitions of Care (Post Inpatient/ED Visit) (Signed)
   10/09/2024  Name: Juan Merritt MRN: [id number] DOB: [birth year]  Today's TOC FU Call Status: Today's TOC FU Call Status:: Unsuccessful Call (2nd Attempt) Unsuccessful Call (2nd Attempt) Date: 10/09/24  Attempted to reach the patient regarding the most recent Inpatient/ED visit.  Follow Up Plan: Additional outreach attempts will be made to reach the patient to complete the Transitions of Care (Post Inpatient/ED visit) call.   Mliss Creed [facility], BSN RN Care Manager/ Transition of Care [place]/ [facility] [phone]

## 2024-10-09 NOTE — Progress Notes (Signed)
 Patient result letter mailed Patient's PCP is on EPIC

## 2024-10-10 ENCOUNTER — Telehealth: Payer: Self-pay

## 2024-10-10 NOTE — Telephone Encounter (Signed)
 Spoke to Brandon, patient's wife per signed DPR. She says he is working on his dietary changes.

## 2024-10-10 NOTE — Telephone Encounter (Signed)
 Copied from CRM #[id number]. Topic: Clinical - Medical Advice >> Oct 10, 2024  8:07 AM Rosaria BRAVO wrote: Reason for CRM: pt wants to speak to a nurse regarding the flu shot. He was just in the hospital for pneumonia last week. Please advise   Best contact: [phone]

## 2024-10-10 NOTE — Telephone Encounter (Signed)
 Spoke to pt and he has been scheduled for a flu shot. LS

## 2024-10-11 ENCOUNTER — Telehealth: Payer: Self-pay

## 2024-10-11 NOTE — Transitions of Care (Post Inpatient/ED Visit) (Signed)
   10/11/2024  Name: Juan Merritt MRN: [id number] DOB: [birth year]  Today's TOC FU Call Status: Today's TOC FU Call Status:: Unsuccessful Call (3rd Attempt) Unsuccessful Call (3rd Attempt) Date: 10/11/24  Attempted to reach the patient regarding the most recent Inpatient/ED visit.  Follow Up Plan: No further outreach attempts will be made at this time. We have been unable to contact the patient.  Alan Ee, RN, BSN, CEN [employer]- Transition of Care Team.  [facility] [phone]

## 2024-10-15 ENCOUNTER — Telehealth: Payer: Self-pay

## 2024-10-15 NOTE — Telephone Encounter (Signed)
 Spoke to pt and scheduled him for tomorrow. LS

## 2024-10-15 NOTE — Telephone Encounter (Unsigned)
 Copied from CRM [id number]. Topic: Clinical - Medication Question >> Oct 15, 2024 10:02 AM Nathanel BROCKS wrote: Reason for CRM: pt is having a bad flare up of GERD he takes sucralfate (CARAFATE) 1 g tablet Every 6 hrs and pantoprazole  (PROTONIX ) 40 MG tablet. He was recently in the hosp and had his esophagus stretched.  Please call and advise pt what to do.

## 2024-10-16 ENCOUNTER — Other Ambulatory Visit: Payer: Self-pay | Admitting: Family Medicine

## 2024-10-16 ENCOUNTER — Ambulatory Visit: Admitting: Family Medicine

## 2024-10-16 ENCOUNTER — Ambulatory Visit (INDEPENDENT_AMBULATORY_CARE_PROVIDER_SITE_OTHER)

## 2024-10-16 ENCOUNTER — Ambulatory Visit: Payer: Self-pay

## 2024-10-16 ENCOUNTER — Encounter: Payer: Self-pay | Admitting: Family Medicine

## 2024-10-16 VITALS — BP 108/65 | HR 101 | Temp 98.0°F | Ht 70.5 in | Wt 238.0 lb

## 2024-10-16 DIAGNOSIS — J449 Chronic obstructive pulmonary disease, unspecified: Secondary | ICD-10-CM | POA: Diagnosis not present

## 2024-10-16 DIAGNOSIS — C3411 Malignant neoplasm of upper lobe, right bronchus or lung: Secondary | ICD-10-CM

## 2024-10-16 DIAGNOSIS — B449 Aspergillosis, unspecified: Secondary | ICD-10-CM

## 2024-10-16 DIAGNOSIS — R918 Other nonspecific abnormal finding of lung field: Secondary | ICD-10-CM | POA: Diagnosis not present

## 2024-10-16 DIAGNOSIS — B3781 Candidal esophagitis: Secondary | ICD-10-CM

## 2024-10-16 DIAGNOSIS — R0600 Dyspnea, unspecified: Secondary | ICD-10-CM | POA: Diagnosis not present

## 2024-10-16 DIAGNOSIS — K5900 Constipation, unspecified: Secondary | ICD-10-CM

## 2024-10-16 DIAGNOSIS — R059 Cough, unspecified: Secondary | ICD-10-CM | POA: Diagnosis not present

## 2024-10-16 DIAGNOSIS — K2100 Gastro-esophageal reflux disease with esophagitis, without bleeding: Secondary | ICD-10-CM

## 2024-10-16 MED ORDER — DEXLANSOPRAZOLE 60 MG PO CPDR: 60.0000 mg | DELAYED_RELEASE_CAPSULE | Freq: Two times a day (BID) | ORAL | 1 refills | Status: DC

## 2024-10-16 MED ORDER — ESOMEPRAZOLE MAGNESIUM 40 MG PO CPDR: 40.0000 mg | DELAYED_RELEASE_CAPSULE | Freq: Two times a day (BID) | ORAL | 5 refills | Status: DC

## 2024-10-16 MED ORDER — FLUCONAZOLE 100 MG PO TABS: ORAL_TABLET | ORAL | 0 refills | Status: DC

## 2024-10-16 MED ORDER — LIDOCAINE VISCOUS HCL 2 % MT SOLN: 15.0000 mL | OROMUCOSAL | 1 refills | Status: DC | PRN

## 2024-10-16 NOTE — Telephone Encounter (Signed)
 Copied from CRM [id number]. Topic: Clinical - Medication Question >> Oct 16, 2024  4:29 PM Kevelyn M wrote: Reason for CRM: Patient's wife calling because patient is in a lot of pain. The pharmacy has questions in regards to the medication. Previous CRM sent to Nurse. Called [facility] and nurses are seeing patients and they have been made aware of this situation.  Phone call placed to patient's wife. No answer-left voicemail to call back to Nurse Triage. Will place in call backs.

## 2024-10-16 NOTE — Telephone Encounter (Signed)
 FYI Only or Action Required?: Action required by provider: medication follow up.  Patient was last seen in primary care on 10/16/2024 by Juan Lowers, MD.  Called Nurse Triage reporting Medication Problem.  Symptoms began N/A.SABRA  Interventions attempted: Other: Nexium 20mg , pantoprazole  40mg , carafate, Tylenol , 4 Tums .  Symptoms are: GERD.  Triage Disposition: No disposition on file.  Patient/caregiver understands and will follow disposition?:              Copied from CRM [id number]. Topic: Clinical - Red Word Triage >> Oct 16, 2024  3:20 PM Jasmin G wrote: [facility] that prompted transfer to Nurse Triage: Pt's wife initially called due to pt's preferred pharmacy needing further further info before they prescribe med or needing an alternative option to prescribe for pt due to experiencing a gerd flare up with possible yeast growth, pt's wife states that pt was seen today at clinic, pt's wife gave him  20 mg of Nexium but says that is not relieving him from any pain. Answer Assessment - Initial Assessment Questions 1. REASON FOR CALL or QUESTION: What is your reason for calling today? or How can I best     Wife states there are issues at the pharmacy with the patient's new prescriptions sent in today by Dr Juan. She would also like to clarify the patient is already on pantoprazole  DR 40mg  bid and they have viscous lidocaine  at home. The pantoprazole  is not on his home med list. 2 notes in chart today regarding prescription issues routed to Dr Juan by LPN earlier today. Patient has treated symptoms today so far with Nexium 20mg , Tylenol , carafate, 4 Tums, diflucan. Patient is still experiencing GERD symptoms. Wife states she had to give him a 0.25mg  Xanax to help calm him down.  2. CALLER: Document the source of call. (e.g., laboratory staff, caregiver or patient).     Wife   Called [facility] and spoke with Mitsy to make office aware of pharmacy issue with prescriptions as well as  both medications being medications the patient's wife reports he already has at home.  Protocols used: PCP Call - No Triage-A-AH

## 2024-10-16 NOTE — Telephone Encounter (Signed)
  pantoprazole  (PROTONIX ) 40 MG tablet        Changed from: dexlansoprazole (DEXILANT) 60 MG capsule    Pharmacy comment: Alternative Requested:INSURANCE WILL ONLY COVER 1 CAPSULE DAILY. WILL NEED A PA FOR 2.   All Pharmacy Suggested Alternatives:  pantoprazole  (PROTONIX ) 40 MG tablet lansoprazole (PREVACID) 30 MG capsule esomeprazole (NEXIUM) 20 MG capsule omeprazole (PRILOSEC) 20 MG capsule

## 2024-10-16 NOTE — Telephone Encounter (Signed)
 Third attempt to reach patient for triage. LVM for patient to return call to [phone]. Call routed to [facility] for follow up

## 2024-10-16 NOTE — Telephone Encounter (Signed)
 Second attempt to contact patient for triaget: LVM for patient to return call to [phone], placed into callbacks for follow up     Copied from CRM #[id number]. Topic: Clinical - Medication Question >> Oct 16, 2024  4:29 PM Kevelyn M wrote: Reason for CRM: Patient's wife calling because patient is in a lot of pain. The pharmacy has questions in regards to the medication. Previous CRM sent to Nurse. Called [facility] and nurses are seeing patients and they have been made aware of this situation.

## 2024-10-16 NOTE — Progress Notes (Signed)
 Subjective:  Patient ID: Juan Merritt, male    DOB: 13-Nov-1951  Age: 73 y.o. MRN: [id number]  CC: Gastroesophageal Reflux (Esophagus stretched and every since then pt has had burning in throat, feels like throat is closing and flare up of acid. Meds not helping. ), Immune therapy (Last immune therapy at [facility] 10/09), and Constipation (It has been a few days since having bowel movement. No stomach pain. )   HPI  Discussed the use of AI scribe software for clinical note transcription with the patient, who gave verbal consent to proceed.  History of Present Illness Juan Merritt is a 73 year old male with GERD and a history of lung cancer who presents with persistent burning throat and chest pain.  He experiences a persistent burning sensation from his throat down to his chest, described as 'burning all the way, everything.' This discomfort wakes him up in the middle of the night and has been ongoing despite previous interventions. He underwent an endoscopy with dilation for a narrowed area in his esophagus, but continues to experience significant burning and discomfort. He has been taking pantoprazole  twice daily and Carafate every six hours, but these have not provided sufficient relief. He has also used Tums and Maalox for temporary relief.  He was hospitalized earlier this month for pneumonia in the left lower lung, which was treated with antibiotics and steroids. He continues to experience a cough, although it has decreased since his hospital discharge. He was on antibiotics for five days and steroids during his hospital stay.  He has a history of lung cancer, with previous involvement of lymph nodes. He completed chemotherapy and radiation therapy six months ago. He reports skin sensitivity, particularly in the area where he points to his previous lymph node involvement, which has been present since his recent hospitalization.  He has made lifestyle changes, including reducing smoking  and eliminating Mountain Dew from his diet. He is not consuming alcohol. His wife has been monitoring his diet closely to avoid exacerbating his symptoms.  He is currently taking Eliquis, which required holding prior to his endoscopy. No black or tarry stools, and no coughing or spitting up blood.          10/16/2024   10:22 AM 09/11/2024    8:03 AM 08/22/2024    9:25 AM  Depression screen PHQ 2/9  Decreased Interest 2 0 0  Down, Depressed, Hopeless 2 0 0  PHQ - 2 Score 4 0 0  Altered sleeping 2    Tired, decreased energy 2    Change in appetite 2    Feeling bad or failure about yourself  0    Trouble concentrating 1    Moving slowly or fidgety/restless 0    Suicidal thoughts 0    PHQ-9 Score 11      History [name] has a past medical history of Arthritis, BPH associated with nocturia, Cancer (HCC), Chronic gout, COPD (chronic obstructive pulmonary disease) (HCC), History of colon polyps, History of COVID-19, Hyperlipidemia, mixed, Hypertension, Phimosis, and Type 2 diabetes mellitus (HCC).   He has a past surgical history that includes Laparoscopic cholecystectomy (2008); Tonsillectomy; Colonoscopy (12/01/2021); Circumcision (N/A, 05/19/2022); Endobronchial ultrasound (01/22/2024); Bronchial needle aspiration biopsy (01/22/2024); Bronchial biopsy (01/22/2024); Bronchial brushings (01/22/2024); Video bronchoscopy with radial endobronchial ultrasound (01/22/2024); Fine needle aspiration (01/22/2024); Left heart cath; Esophagogastroduodenoscopy (N/A, 10/04/2024); and Esophageal dilation (N/A, 10/04/2024).   His family history includes Asthma in his mother; Diabetes in his father; Esophageal cancer in his paternal  uncle; Heart attack in an other family member; Heart attack (age of onset: 1) in his father; Leukemia in his brother; Lung disease in an other family member.He reports that he has been smoking cigarettes. He started smoking about 47 years ago. He has a 10 pack-year smoking  history. His smokeless tobacco use includes chew. He reports current alcohol use of about 24.0 standard drinks of alcohol per week. He reports that he does not use drugs.    ROS Review of Systems  Constitutional: Negative.   HENT: Negative.    Eyes:  Negative for visual disturbance.  Respiratory:  Negative for cough and shortness of breath.   Cardiovascular:  Positive for chest pain. Negative for leg swelling.  Gastrointestinal:  Positive for abdominal pain and nausea. Negative for diarrhea and vomiting.  Genitourinary:  Negative for difficulty urinating.  Musculoskeletal:  Positive for arthralgias. Negative for myalgias.  Skin:  Negative for rash.  Neurological:  Negative for headaches.  Psychiatric/Behavioral:  Negative for sleep disturbance.     Objective:  BP 108/65   Pulse (!) 101   Temp 98 F (36.7 C)   Ht 5' 10.5 (1.791 m)   Wt 238 lb (108 kg)   SpO2 96%   BMI 33.67 kg/m   BP Readings from Last 3 Encounters:  10/16/24 108/65  10/04/24 119/80  09/17/24 108/73    Wt Readings from Last 3 Encounters:  10/16/24 238 lb (108 kg)  10/01/24 246 lb 0.5 oz (111.6 kg)  09/17/24 252 lb (114.3 kg)     Physical Exam Physical Exam GENERAL: Alert, cooperative, well developed, no acute distress HEENT: Normocephalic, normal oropharynx, moist mucous membranes CHEST: Clear to auscultation bilaterally, no wheezes, rhonchi, or crackles CARDIOVASCULAR: Normal heart rate and rhythm, S1 and S2 normal without murmurs ABDOMEN: Soft,-tender, non-distended, without organomegaly, normal bowel sounds EXTREMITIES: No cyanosis or edema NEUROLOGICAL: Cranial nerves grossly intact, moves all extremities without gross motor or sensory deficit SKIN: Normal   Assessment & Plan:  Candida esophagitis (HCC) -     CBC with Differential/Platelet -     CMP14+EGFR  Aspergillus (HCC) -     CBC with Differential/Platelet  COPD GOLD 2 -     CBC with Differential/Platelet -     CMP14+EGFR -      DG Chest 2 View; Future  Malignant neoplasm of upper lobe of right lung (HCC) -     CBC with Differential/Platelet -     CMP14+EGFR -     DG Chest 2 View; Future  Gastroesophageal reflux disease with esophagitis without hemorrhage  Other orders -     Fluconazole; Take two with first dose. Then starting the next day take one daily until all are taken.  Dispense: 15 tablet; Refill: 0 -     Esomeprazole Magnesium ; Take 1 capsule (40 mg total) by mouth 2 (two) times daily. On an empty stomach  Dispense: 30 capsule; Refill: 5    Assessment and Plan Assessment & Plan Gastroesophageal reflux disease with esophagitis and hiatal hernia   He experiences a persistent burning sensation from the throat down, worsened by recent endoscopic dilation. A hiatal hernia contributes to reflux symptoms. Current treatment with pantoprazole  and Carafate is insufficient, and dilation may have exacerbated reflux by allowing acid to rise more easily. Switch from pantoprazole  to a stronger PPI like Dexilant or Nexium, depending on insurance. Prescribe Maalox and lidocaine  for symptomatic relief, to be mixed as needed. Advise on medication timing for efficacy.  Esophageal candidiasis (suspected)  Endoscopic images suggest possible yeast growth, with gray plaque indicative of yeast. Previous suspicion by a pulmonologist was not confirmed. Prescribe antifungal medication, starting with two pills on the first day, then one pill daily for two weeks.  Pneumonia, left lower lobe (recent)   He was recently hospitalized for left lower lobe pneumonia. He has a persistent cough but no hemoptysis or melena. Order a chest x-ray to evaluate pneumonia resolution and an abdominal x-ray to assess for additional issues.  Malignant neoplasm of upper lobe of right lung   He completed chemotherapy and radiation for lung cancer six months ago. Current symptoms may relate to cancer or other conditions like reflux or esophagitis. Skin  sensitivity may be due to cancer or referred pain from esophagitis.  Type 2 diabetes mellitus with diabetic neuropathy   Diabetes may increase the risk of infections such as candidiasis.       Follow-up: Return in about 2 weeks (around 10/30/2024), or if symptoms worsen or fail to improve.  Butler Der, M.D.

## 2024-10-16 NOTE — Telephone Encounter (Signed)
 This RN spoke with pt's wife. Call disconnected. Requesting a follow up call from office due to pt's symptoms.

## 2024-10-17 ENCOUNTER — Ambulatory Visit: Payer: Self-pay | Admitting: Family Medicine

## 2024-10-17 LAB — CBC WITH DIFFERENTIAL/PLATELET
Basophils Absolute: 0 x10E3/uL (ref 0.0–0.2)
Basos: 0 %
EOS (ABSOLUTE): 0 x10E3/uL (ref 0.0–0.4)
Eos: 0 %
Hematocrit: 38.3 % (ref 37.5–51.0)
Hemoglobin: 12.4 g/dL — ABNORMAL LOW (ref 13.0–17.7)
Immature Grans (Abs): 0 x10E3/uL (ref 0.0–0.1)
Immature Granulocytes: 0 %
Lymphocytes Absolute: 1 x10E3/uL (ref 0.7–3.1)
Lymphs: 9 %
MCH: 31.9 pg (ref 26.6–33.0)
MCHC: 32.4 g/dL (ref 31.5–35.7)
MCV: 99 fL — ABNORMAL HIGH (ref 79–97)
Monocytes Absolute: 1.2 x10E3/uL — ABNORMAL HIGH (ref 0.1–0.9)
Monocytes: 12 %
Neutrophils Absolute: 8 x10E3/uL — ABNORMAL HIGH (ref 1.4–7.0)
Neutrophils: 79 %
Platelets: 251 x10E3/uL (ref 150–450)
RBC: 3.89 x10E6/uL — ABNORMAL LOW (ref 4.14–5.80)
RDW: 12.7 % (ref 11.6–15.4)
WBC: 10.2 x10E3/uL (ref 3.4–10.8)

## 2024-10-17 LAB — CMP14+EGFR
ALT: 27 IU/L (ref 0–44)
AST: 28 IU/L (ref 0–40)
Albumin: 3.5 g/dL — ABNORMAL LOW (ref 3.8–4.8)
Alkaline Phosphatase: 102 IU/L (ref 47–123)
BUN/Creatinine Ratio: 17 (ref 10–24)
BUN: 19 mg/dL (ref 8–27)
Bilirubin Total: 0.5 mg/dL (ref 0.0–1.2)
CO2: 31 mmol/L — ABNORMAL HIGH (ref 20–29)
Calcium: 9.2 mg/dL (ref 8.6–10.2)
Chloride: 90 mmol/L — ABNORMAL LOW (ref 96–106)
Creatinine, Ser: 1.09 mg/dL (ref 0.76–1.27)
Globulin, Total: 3.6 g/dL (ref 1.5–4.5)
Glucose: 106 mg/dL — ABNORMAL HIGH (ref 70–99)
Potassium: 3.1 mmol/L — ABNORMAL LOW (ref 3.5–5.2)
Sodium: 138 mmol/L (ref 134–144)
Total Protein: 7.1 g/dL (ref 6.0–8.5)
eGFR: 73 mL/min/1.73 (ref >59)

## 2024-10-17 NOTE — Telephone Encounter (Signed)
 Addressed in a separate encounter. LS

## 2024-10-18 ENCOUNTER — Other Ambulatory Visit: Payer: Self-pay

## 2024-10-18 ENCOUNTER — Ambulatory Visit: Payer: Self-pay

## 2024-10-18 ENCOUNTER — Encounter (HOSPITAL_COMMUNITY): Payer: Self-pay

## 2024-10-18 ENCOUNTER — Emergency Department (HOSPITAL_COMMUNITY)
Admission: EM | Admit: 2024-10-18 | Discharge: 2024-10-18 | Disposition: A | Discharge status: Home / Self Care | Attending: Emergency Medicine | Admitting: Emergency Medicine

## 2024-10-18 ENCOUNTER — Emergency Department (HOSPITAL_COMMUNITY)

## 2024-10-18 DIAGNOSIS — R000 Tachycardia, unspecified: Secondary | ICD-10-CM | POA: Insufficient documentation

## 2024-10-18 DIAGNOSIS — R1013 Epigastric pain: Secondary | ICD-10-CM | POA: Insufficient documentation

## 2024-10-18 DIAGNOSIS — K209 Esophagitis, unspecified without bleeding: Secondary | ICD-10-CM | POA: Diagnosis not present

## 2024-10-18 DIAGNOSIS — R0789 Other chest pain: Secondary | ICD-10-CM | POA: Diagnosis not present

## 2024-10-18 DIAGNOSIS — I700 Atherosclerosis of aorta: Secondary | ICD-10-CM | POA: Diagnosis not present

## 2024-10-18 DIAGNOSIS — D649 Anemia, unspecified: Secondary | ICD-10-CM | POA: Diagnosis not present

## 2024-10-18 DIAGNOSIS — Z7901 Long term (current) use of anticoagulants: Secondary | ICD-10-CM | POA: Diagnosis not present

## 2024-10-18 DIAGNOSIS — R911 Solitary pulmonary nodule: Secondary | ICD-10-CM | POA: Diagnosis not present

## 2024-10-18 LAB — CBC WITH DIFFERENTIAL/PLATELET
Abs Immature Granulocytes: 0.03 K/uL (ref 0.00–0.07)
Basophils Absolute: 0 K/uL (ref 0.0–0.1)
Basophils Relative: 0 %
Eosinophils Absolute: 0 K/uL (ref 0.0–0.5)
Eosinophils Relative: 0 %
HCT: 34.4 % — ABNORMAL LOW (ref 39.0–52.0)
Hemoglobin: 11.5 g/dL — ABNORMAL LOW (ref 13.0–17.0)
Immature Granulocytes: 0 %
Lymphocytes Relative: 14 %
Lymphs Abs: 1.2 K/uL (ref 0.7–4.0)
MCH: 32.5 pg (ref 26.0–34.0)
MCHC: 33.4 g/dL (ref 30.0–36.0)
MCV: 97.2 fL (ref 80.0–100.0)
Monocytes Absolute: 0.9 K/uL (ref 0.1–1.0)
Monocytes Relative: 11 %
Neutro Abs: 6.5 K/uL (ref 1.7–7.7)
Neutrophils Relative %: 75 %
Platelets: 234 K/uL (ref 150–400)
RBC: 3.54 MIL/uL — ABNORMAL LOW (ref 4.22–5.81)
RDW: 12.9 % (ref 11.5–15.5)
WBC: 8.6 K/uL (ref 4.0–10.5)
nRBC: 0 % (ref 0.0–0.2)

## 2024-10-18 LAB — COMPREHENSIVE METABOLIC PANEL WITH GFR
ALT: 33 U/L (ref 0–44)
AST: 36 U/L (ref 15–41)
Albumin: 3.7 g/dL (ref 3.5–5.0)
Alkaline Phosphatase: 125 U/L (ref 38–126)
Anion gap: 12 (ref 5–15)
BUN: 19 mg/dL (ref 8–23)
CO2: 34 mmol/L — ABNORMAL HIGH (ref 22–32)
Calcium: 9.3 mg/dL (ref 8.9–10.3)
Chloride: 91 mmol/L — ABNORMAL LOW (ref 98–111)
Creatinine, Ser: 1.01 mg/dL (ref 0.61–1.24)
GFR, Estimated: >60 mL/min (ref >60)
Glucose, Bld: 125 mg/dL — ABNORMAL HIGH (ref 70–99)
Potassium: 3.5 mmol/L (ref 3.5–5.1)
Sodium: 137 mmol/L (ref 135–145)
Total Bilirubin: 0.5 mg/dL (ref 0.0–1.2)
Total Protein: 8 g/dL (ref 6.5–8.1)

## 2024-10-18 LAB — TROPONIN T, HIGH SENSITIVITY: Troponin T High Sensitivity: <15 ng/L (ref 0–19)

## 2024-10-18 LAB — LIPASE, BLOOD: Lipase: 50 U/L (ref 11–51)

## 2024-10-18 MED ORDER — PANTOPRAZOLE SODIUM 40 MG IV SOLR
40.0000 mg | Freq: Once | INTRAVENOUS | Status: AC
Start: 2024-10-18 — End: 2024-10-18
  Administered 2024-10-18: 40 mg via INTRAVENOUS
  Filled 2024-10-18: qty 10

## 2024-10-18 MED ORDER — ALUM & MAG HYDROXIDE-SIMETH 200-200-20 MG/5ML PO SUSP
30.0000 mL | Freq: Once | ORAL | Status: AC
  Administered 2024-10-18: 30 mL via ORAL
  Filled 2024-10-18: qty 30

## 2024-10-18 MED ORDER — LACTATED RINGERS IV BOLUS
1000.0000 mL | Freq: Once | INTRAVENOUS | Status: AC
  Administered 2024-10-18: 1000 mL via INTRAVENOUS

## 2024-10-18 MED ORDER — OXYCODONE-ACETAMINOPHEN 5-325 MG PO TABS
1.0000 | ORAL_TABLET | Freq: Once | ORAL | Status: AC
  Administered 2024-10-18: 1 via ORAL
  Filled 2024-10-18: qty 1

## 2024-10-18 MED ORDER — OXYCODONE-ACETAMINOPHEN 5-325 MG PO TABS: 1.0000 | ORAL_TABLET | Freq: Four times a day (QID) | ORAL | 0 refills | Status: DC | PRN

## 2024-10-18 MED ORDER — LIDOCAINE VISCOUS HCL 2 % MT SOLN
15.0000 mL | Freq: Once | OROMUCOSAL | Status: AC
  Administered 2024-10-18: 15 mL via ORAL
  Filled 2024-10-18: qty 15

## 2024-10-18 MED ORDER — IOHEXOL 350 MG/ML SOLN
75.0000 mL | Freq: Once | INTRAVENOUS | Status: AC | PRN
  Administered 2024-10-18: 75 mL via INTRAVENOUS

## 2024-10-18 NOTE — ED Provider Notes (Signed)
 [place] EMERGENCY DEPARTMENT AT [facility] Provider Note   CSN: [id number] Arrival date & time: 10/18/24  1423     Patient presents with: Gastroesophageal Reflux   Juan Merritt is a 73 y.o. male.   Patient is a 73 year old male who presents to emergency department chief complaint of epigastric and right sided chest pain which has been ongoing for approximate the past 2 weeks.  He was recently seen in the emergency department and did have an upper endoscopy performed with esophageal dilation.  Patient notes that he has had lack of appetite and poor p.o. intake.  He describes the pain as a burning sensation.  He has been compliant with his home medications.  He denies any associated nausea, vomiting, diarrhea.  He has had no dysuria or hematuria.  He denies any associated shortness of breath.   Gastroesophageal Reflux Associated symptoms include chest pain and abdominal pain.       Prior to Admission medications   Medication Sig Start Date End Date Taking? Authorizing Provider  acetaminophen  (TYLENOL ) 325 MG tablet Take 2 tablets (650 mg total) by mouth every 6 (six) hours as needed for mild pain (pain score 1-3) or fever (or Fever >/= 101). 10/04/24   Pearlean Manus, MD  albuterol  (VENTOLIN  HFA) 108 (90 Base) MCG/ACT inhaler Inhale 2 puffs into the lungs every 4 (four) hours as needed for wheezing or shortness of breath. 10/04/24   Pearlean Manus, MD  allopurinol  (ZYLOPRIM ) 100 MG tablet TAKE 1 TABLET (100 MG TOTAL) BY MOUTH DAILY. TO PREVENT GOUT 05/07/24   Zollie Lowers, MD  apixaban (ELIQUIS) 5 MG TABS tablet Take 1 tablet (5 mg total) by mouth 2 (two) times daily. 10/04/24   Pearlean Manus, MD  budesonide -formoterol  (SYMBICORT ) 160-4.5 MCG/ACT inhaler Inhale 2 puffs into the lungs 2 (two) times daily. 10/04/24   Pearlean Manus, MD  Continuous Glucose Sensor (FREESTYLE LIBRE 2 SENSOR) MISC Apply 1 Units topically once a week. USE TO TEST BLOOD SUGAR  CONTINUOUSLY. Dx. E11.65 SEND TO [facility] FOR MEDICARE COVERAGE, DO NOT SEND TO LOCAL PHARMACY 10/04/24   Pearlean Manus, MD  cyanocobalamin  (VITAMIN B12) 1000 MCG tablet Take 1 tablet (1,000 mcg total) by mouth daily. 10/04/24   Pearlean Manus, MD  esomeprazole (NEXIUM) 40 MG capsule Take 1 capsule (40 mg total) by mouth 2 (two) times daily. On an empty stomach 10/16/24   Zollie Lowers, MD  fluconazole (DIFLUCAN) 100 MG tablet Take two with first dose. Then starting the next day take one daily until all are taken. 10/16/24   Zollie Lowers, MD  folic acid  (FOLATE) 400 MCG tablet Take 1 tablet (400 mcg total) by mouth daily. 10/04/24   Pearlean Manus, MD  guaiFENesin  (MUCINEX ) 600 MG 12 hr tablet Take 1 tablet (600 mg total) by mouth 2 (two) times daily. 10/04/24   Pearlean Manus, MD  ipratropium-albuterol  (DUONEB) 0.5-2.5 (3) MG/3ML SOLN Take 3 mLs by nebulization every 6 (six) hours as needed. 10/04/24   Pearlean Manus, MD  Lactobacillus (PROBIOTIC ACIDOPHILUS PO) Take 1 capsule by mouth daily.    [provider]  lidocaine  (XYLOCAINE ) 2 % solution Use as directed 15 mLs in the mouth or throat every 3 (three) hours as needed (throat pain, reflux). Will have to swallow due to presence of yeast and pain in the esophagus 10/16/24   Zollie Lowers, MD  metFORMIN  (GLUCOPHAGE -XR) 750 MG 24 hr tablet Take 1 tablet (750 mg total) by mouth daily with breakfast. 10/04/24  Pearlean Manus, MD  potassium chloride  SA (KLOR-CON  M20) 20 MEQ tablet TAKE 1 TABLET (20 MEQ TOTAL) DAILY BY MOUTH. 08/07/24   Zollie Lowers, MD  sucralfate (CARAFATE) 1 g tablet Take 1 tablet (1 g total) by mouth 4 (four) times daily. 10/04/24 11/03/24  Pearlean Manus, MD  tamsulosin  (FLOMAX ) 0.4 MG CAPS capsule TAKE 2 CAPSULES BY MOUTH AT BEDTIME FOR URINE FLOW AND PROSTATE 03/28/24   Zollie Lowers, MD  thiamine  (VITAMIN B-1) 100 MG tablet Take 1 tablet (100 mg total) by mouth daily. 10/05/24   Pearlean Manus, MD  torsemide  (DEMADEX ) 100 MG tablet Take 100 mg by mouth 2 (two) times daily.    [provider]    Allergies: Patient has no known allergies.    Review of Systems  Cardiovascular:  Positive for chest pain.  Gastrointestinal:  Positive for abdominal pain.  All other systems reviewed and are negative.   Updated Vital Signs BP 114/72 (BP Location: Right Arm)   Pulse (!) 107   Temp 98.7 F (37.1 C) (Oral)   Resp 19   Ht 5' 10 (1.778 m)   Wt 107 kg   SpO2 95%   BMI 33.85 kg/m   Physical Exam Vitals and nursing note reviewed.  Constitutional:      General: He is not in acute distress.    Appearance: Normal appearance. He is not ill-appearing.  HENT:     Head: Normocephalic and atraumatic.     Nose: Nose normal.     Mouth/Throat:     Mouth: Mucous membranes are moist.  Eyes:     Extraocular Movements: Extraocular movements intact.     Conjunctiva/sclera: Conjunctivae normal.     Pupils: Pupils are equal, round, and reactive to light.  Cardiovascular:     Rate and Rhythm: Regular rhythm. Tachycardia present.     Pulses: Normal pulses.     Heart sounds: Normal heart sounds. No murmur heard.    No gallop.  Pulmonary:     Effort: Pulmonary effort is normal. No respiratory distress.     Breath sounds: Normal breath sounds. No stridor. No wheezing, rhonchi or rales.  Abdominal:     General: Abdomen is flat. Bowel sounds are normal. There is no distension.     Palpations: Abdomen is soft.     Tenderness: There is no guarding.     Comments: Tenderness palpation to epigastric region  Musculoskeletal:        General: Normal range of motion.     Cervical back: Normal range of motion and neck supple. No rigidity or tenderness.     Right lower leg: No edema.     Left lower leg: No edema.  Skin:    General: Skin is warm and dry.     Findings: No bruising or rash.  Neurological:     General: No focal deficit present.     Mental Status: He is alert and  oriented to person, place, and time. Mental status is at baseline.     Cranial Nerves: No cranial nerve deficit.     Sensory: No sensory deficit.     Motor: No weakness.     Coordination: Coordination normal.     Gait: Gait normal.  Psychiatric:        Mood and Affect: Mood normal.        Behavior: Behavior normal.        Thought Content: Thought content normal.        Judgment: Judgment normal.     (  all labs ordered are listed, but only abnormal results are displayed) Labs Reviewed  COMPREHENSIVE METABOLIC PANEL WITH GFR  LIPASE, BLOOD  CBC WITH DIFFERENTIAL/PLATELET  URINALYSIS, ROUTINE W REFLEX MICROSCOPIC  TROPONIN T, HIGH SENSITIVITY    EKG: None  Radiology: No results found.   Procedures   Medications Ordered in the ED  alum & mag hydroxide-simeth (MAALOX/MYLANTA) 200-200-20 MG/5ML suspension 30 mL (has no administration in time range)    And  lidocaine  (XYLOCAINE ) 2 % viscous mouth solution 15 mL (has no administration in time range)  pantoprazole  (PROTONIX ) injection 40 mg (has no administration in time range)                                    Medical Decision Making Amount and/or Complexity of Data Reviewed Labs: ordered. Radiology: ordered.  Risk OTC drugs. Prescription drug management.   This patient presents to the ED for concern of abdominal pain, chest pain differential diagnosis includes ACS, pulmonary embolus, pericarditis, myocarditis, endocarditis, aortic aneurysm or dissection, pneumonia, pneumothorax, hemothorax, pancreatitis, cholangitis, cholecystitis, choledocholithiasis, mesenteric ischemia    Additional history obtained:  Additional history obtained from family External records from outside source obtained and reviewed including medical records   Lab Tests:  I Ordered, and personally interpreted labs.  The pertinent results include: No leukocytosis, anemia at baseline, unremarkable electrolytes, normal kidney function liver  function, negative lipase, negative troponin   Imaging Studies ordered:  I ordered imaging studies including CTA chest, CT abdomen pelvis I independently visualized and interpreted imaging which showed no PE, right sided pulmonary nodule, no acute intra-abdominal process I agree with the radiologist interpretation   Medicines ordered and prescription drug management:  I ordered medication including Percocet, IV fluids, GI cocktail, Protonix  for esophagitis Reevaluation of the patient after these medicines showed that the patient improved I have reviewed the patients home medicines and have made adjustments as needed   Problem List / ED Course:  Patient is doing well at this time and is stable for discharge home.  Did discuss with patient that symptoms are still most consistent with esophagitis at this point.  He was directed to continue compliance with all home medications and will provide additional pain control.  CT scan of the chest abdomen pelvis was unremarkable.  He has no indication for ACS at this time.  EKG has no acute ischemic changes and he has negative troponin.  No acute surgical process was noted on CT scan of the abdomen and pelvis.  Lipase is within normal limits and do not suspect pancreatitis.  Did send message to Dr. Eartha with GI for close follow-up.  Close follow-up with PCP was discussed as well.  Patient is already aware of the pulmonary nodule and is being followed for this by primary care doctor.  Strict precautions were provided for any new or worsening symptoms.  Patient voiced understanding and had no additional questions. Patient was fully evaluated by attending physician who is in agreement to plan at this time.    Social Determinants of Health:  None        Final diagnoses:  None    ED Discharge Orders     None          Daralene Lonni JONETTA DEVONNA 10/18/24 1823    Francesca Elsie CROME, MD 10/18/24 2119

## 2024-10-18 NOTE — ED Triage Notes (Signed)
 Pt in by POV for acid reflux and abd pain. Pt has a hiatal hernia and had his esophagus stretched after being seen here on 10/14. Pt takes esomeprazole without relief.

## 2024-10-18 NOTE — ED Notes (Signed)
 Patient transported to CT

## 2024-10-18 NOTE — ED Notes (Addendum)
 Power port accessed in L chest. Sterile field maintained.

## 2024-10-18 NOTE — Discharge Instructions (Signed)
 Please follow-up closely with your primary care doctor and gastroenterology on outpatient basis.  Additional pain medication has been provided.  Return to emergency department immediately for any new or worsening symptoms.

## 2024-10-18 NOTE — Telephone Encounter (Signed)
 FYI Only or Action Required?: Action required by provider: request for appointment.  Patient was last seen in primary care on 10/16/2024 by Zollie Lowers, MD.  Called Nurse Triage reporting Abdominal Pain.  Symptoms began about a month ago.  Interventions attempted: Prescription medications:  SABRA  Symptoms are: gradually worsening. Upper abdominal pain worsening. Was up all night per wife. No availability until Monday. Pt. Told wife he wants to go to ED.  Triage Disposition: Go to ED Now (Notify PCP)  Patient/caregiver understands and will follow disposition?: Yes     Copied from CRM [id number]. Topic: Clinical - Red Word Triage >> Oct 18, 2024 11:58 AM Juan Merritt wrote: Red Word that prompted transfer to Nurse Triage: pain in the mid abdomen, spreads up to esophageal area, pain level 9 Answer Assessment - Initial Assessment Questions 1. LOCATION: Where does it hurt?      upper 2. RADIATION: Does the pain shoot anywhere else? (e.g., chest, back)     no 3. ONSET: When did the pain begin? (Minutes, hours or days ago)      1 month ago 4. SUDDEN: Gradual or sudden onset?     gradual 5. PATTERN Does the pain come and go, or is it constant?     Constant now 6. SEVERITY: How bad is the pain?  (e.g., Scale 1-10; mild, moderate, or severe)     severe 7. RECURRENT SYMPTOM: Have you ever had this type of stomach pain before? If Yes, ask: When was the last time? and What happened that time?      no 8. CAUSE: What do you think is causing the stomach pain? (e.g., gallstones, recent abdominal surgery)     esophagitis 9. RELIEVING/AGGRAVATING FACTORS: What makes it better or worse? (e.g., antacids, bending or twisting motion, bowel movement)     no 10. OTHER SYMPTOMS: Do you have any other symptoms? (e.g., back pain, diarrhea, fever, urination pain, vomiting)       no  Protocols used: Abdominal Pain - Male-A-AH  Reason for Disposition  [1] SEVERE pain (e.g.,  excruciating) AND [2] present > 1 hour  Answer Assessment - Initial Assessment Questions 1. LOCATION: Where does it hurt?      upper 2. RADIATION: Does the pain shoot anywhere else? (e.g., chest, back)     no 3. ONSET: When did the pain begin? (Minutes, hours or days ago)      1 month ago 4. SUDDEN: Gradual or sudden onset?     gradual 5. PATTERN Does the pain come and go, or is it constant?     Constant now 6. SEVERITY: How bad is the pain?  (e.g., Scale 1-10; mild, moderate, or severe)     severe 7. RECURRENT SYMPTOM: Have you ever had this type of stomach pain before? If Yes, ask: When was the last time? and What happened that time?      no 8. CAUSE: What do you think is causing the stomach pain? (e.g., gallstones, recent abdominal surgery)     esophagitis 9. RELIEVING/AGGRAVATING FACTORS: What makes it better or worse? (e.g., antacids, bending or twisting motion, bowel movement)     no 10. OTHER SYMPTOMS: Do you have any other symptoms? (e.g., back pain, diarrhea, fever, urination pain, vomiting)       no  Protocols used: Abdominal Pain - Male-A-AH

## 2024-10-18 NOTE — Telephone Encounter (Signed)
 noted

## 2024-10-22 ENCOUNTER — Telehealth: Payer: Self-pay

## 2024-10-22 NOTE — Telephone Encounter (Unsigned)
 Copied from CRM #[id number]. Topic: Clinical - Medical Advice >> Oct 22, 2024  8:08 AM Tobias CROME wrote: Reason for CRM: Patient has flu shot scheduled forr tomorrow, patient states he was advised by Dr. Zollie to hold off on getting the flu shot until his test results came back.   Patient inquiring if okay to get flu shot or if it needs to be rescheduled, [phone]

## 2024-10-22 NOTE — Telephone Encounter (Signed)
 Pt informed    LS

## 2024-10-23 ENCOUNTER — Ambulatory Visit: Payer: Self-pay | Admitting: *Deleted

## 2024-10-23 DIAGNOSIS — Z23 Encounter for immunization: Secondary | ICD-10-CM

## 2024-10-27 NOTE — Progress Notes (Unsigned)
 GI Office Note    Referring Provider: Zollie Lowers, MD Primary Care Physician:  Zollie Lowers, MD Primary Gastroenterologist: Lamar HERO.Rourk, MD ?? [facility]  Date:  10/27/2024  ID:  Juan Merritt, DOB 1951-03-11, MRN [id number]   Chief Complaint   No chief complaint on file.  History of Present Illness  Juan Merritt is a 73 y.o. 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 ***Presented with some dysphagia and odynophagia. *** Underwent EGD as noted below.   EGD 10/04/2024: - Benign-appearing esophageal stenosis.  Dilated.   - Normal esophagus biopsied.  - 2 cm hiatal hernia.  - Normal stomach.  - Normal examined duodenum.  - Path: chronic gastritis, nn specific esophageal biopsy - advised to follow up on 11/04/2024. Advised to resume eliquis and once daily PPI.   Today:     Wt Readings from Last 6 Encounters:  10/18/24 235 lb 14.3 oz (107 kg)  10/16/24 238 lb (108 kg)  10/01/24 246 lb 0.5 oz (111.6 kg)  09/17/24 252 lb (114.3 kg)  09/11/24 258 lb 3.2 oz (117.1 kg)  08/22/24 252 lb (114.3 kg)    There is no height or weight on file to calculate BMI.   Current Outpatient Medications  Medication Sig Dispense Refill   acetaminophen  (TYLENOL ) 325 MG tablet Take 2 tablets (650 mg total) by mouth every 6 (six) hours as needed for mild pain (pain score 1-3) or fever (or Fever >/= 101).     albuterol  (VENTOLIN  HFA) 108 (90 Base) MCG/ACT inhaler Inhale 2 puffs into the lungs every 4 (four) hours as needed for wheezing or shortness of breath. 18 each 5   allopurinol  (ZYLOPRIM ) 100 MG tablet TAKE 1 TABLET (100 MG TOTAL) BY MOUTH DAILY. TO  PREVENT GOUT 90 tablet 0   apixaban (ELIQUIS) 5 MG TABS tablet Take 1 tablet (5 mg total) by mouth 2 (two) times daily. 60 tablet 5   budesonide -formoterol  (SYMBICORT ) 160-4.5 MCG/ACT inhaler Inhale 2 puffs into the lungs 2 (two) times daily. 10.2 g 3   Continuous Glucose Sensor (FREESTYLE LIBRE 2 SENSOR) MISC Apply 1 Units topically once a week. USE TO TEST BLOOD SUGAR CONTINUOUSLY. Dx. E11.65 SEND TO [facility] FOR MEDICARE COVERAGE, DO NOT SEND TO LOCAL PHARMACY 4 each 4   cyanocobalamin  (VITAMIN B12) 1000 MCG tablet Take 1 tablet (1,000 mcg total) by mouth daily. 90 tablet 3   esomeprazole (NEXIUM) 40 MG capsule Take 1 capsule (40 mg total) by mouth 2 (two) times daily. On an empty stomach 30 capsule 5   fluconazole (DIFLUCAN) 100 MG tablet Take two with first dose. Then starting the next day take one daily until all are taken. 15 tablet 0   folic acid  (FOLATE) 400 MCG tablet Take 1 tablet (400 mcg total) by mouth daily. 30 tablet 5   guaiFENesin  (MUCINEX ) 600 MG 12 hr tablet Take 1 tablet (600 mg total) by mouth 2 (two) times daily. 20 tablet 0   ipratropium-albuterol  (DUONEB) 0.5-2.5 (3) MG/3ML SOLN Take 3 mLs by nebulization every 6 (six) hours as needed. 360 mL 6   Lactobacillus (PROBIOTIC ACIDOPHILUS PO) Take 1 capsule by mouth daily.  lidocaine  (XYLOCAINE ) 2 % solution Use as directed 15 mLs in the mouth or throat every 3 (three) hours as needed (throat pain, reflux). Will have to swallow due to presence of yeast and pain in the esophagus 200 mL 1   metFORMIN  (GLUCOPHAGE -XR) 750 MG 24 hr tablet Take 1 tablet (750 mg total) by mouth daily with breakfast. 90 tablet 3   oxyCODONE -acetaminophen  (PERCOCET/ROXICET) 5-325 MG tablet Take 1 tablet by mouth every 6 (six) hours as needed for severe pain (pain score 7-10). 15 tablet 0   potassium chloride  SA (KLOR-CON  M20) 20 MEQ tablet TAKE 1 TABLET (20 MEQ TOTAL) DAILY BY MOUTH. 90 tablet 3   sucralfate (CARAFATE) 1 g tablet Take 1 tablet (1  g total) by mouth 4 (four) times daily. 120 tablet 0   tamsulosin  (FLOMAX ) 0.4 MG CAPS capsule TAKE 2 CAPSULES BY MOUTH AT BEDTIME FOR URINE FLOW AND PROSTATE 180 capsule 2   thiamine  (VITAMIN B-1) 100 MG tablet Take 1 tablet (100 mg total) by mouth daily. 30 tablet 5   torsemide  (DEMADEX ) 100 MG tablet Take 100 mg by mouth 2 (two) times daily.     No current facility-administered medications for this visit.    Past Medical History:  Diagnosis Date   Arthritis    BPH associated with nocturia    Cancer (HCC)    stage III non-small cell lung cancer.   Chronic gout    COPD (chronic obstructive pulmonary disease) (HCC)    not on home o2   History of colon polyps    History of COVID-19    04/ 2019 hospital admission in epic, severe sepsis due to covid w/ acute respiratory failure;   and 05/ 2021 mild to moderate symptoms, recovered at home   Hyperlipidemia, mixed    Hypertension    Phimosis    Type 2 diabetes mellitus (HCC)    followed by pcp   (05-17-2022  pt stated checks sugar multiple times daily w/ Libre,  fasting average --- 100-110)    Past Surgical History:  Procedure Laterality Date   BRONCHIAL BIOPSY  01/22/2024   Procedure: BRONCHIAL BIOPSIES;  Surgeon: Shelah Lamar RAMAN, MD;  Location: [facility] ENDOSCOPY;  Service: Pulmonary;;   BRONCHIAL BRUSHINGS  01/22/2024   Procedure: BRONCHIAL BRUSHINGS;  Surgeon: Shelah Lamar RAMAN, MD;  Location: [facility] ENDOSCOPY;  Service: Pulmonary;;   BRONCHIAL NEEDLE ASPIRATION BIOPSY  01/22/2024   Procedure: BRONCHIAL NEEDLE ASPIRATION BIOPSIES;  Surgeon: Shelah Lamar RAMAN, MD;  Location: [facility] ENDOSCOPY;  Service: Pulmonary;;   CIRCUMCISION N/A 05/19/2022   Procedure: CIRCUMCISION ADULT;  Surgeon: Matilda Senior, MD;  Location: [facility];  Service: Urology;  Laterality: N/A;  45 MINUTES   COLONOSCOPY  12/01/2021   by dr legrand   ENDOBRONCHIAL ULTRASOUND  01/22/2024   Procedure: ENDOBRONCHIAL ULTRASOUND;  Surgeon: Shelah Lamar RAMAN, MD;   Location: [facility] ENDOSCOPY;  Service: Pulmonary;;   ESOPHAGEAL DILATION N/A 10/04/2024   Procedure: DILATION, ESOPHAGUS;  Surgeon: Eartha Flavors, Toribio, MD;  Location: [facility] ENDO SUITE;  Service: Gastroenterology;  Laterality: N/A;   ESOPHAGOGASTRODUODENOSCOPY N/A 10/04/2024   Procedure: EGD (ESOPHAGOGASTRODUODENOSCOPY);  Surgeon: Eartha Flavors, Toribio, MD;  Location: [facility] ENDO SUITE;  Service: Gastroenterology;  Laterality: N/A;   FINE NEEDLE ASPIRATION  01/22/2024   Procedure: FINE NEEDLE ASPIRATION (FNA) LINEAR;  Surgeon: Shelah Lamar RAMAN, MD;  Location: [facility] ENDOSCOPY;  Service: Pulmonary;;   LAPAROSCOPIC CHOLECYSTECTOMY  2008   in [place]   LEFT HEART CATH     TONSILLECTOMY  child   VIDEO BRONCHOSCOPY WITH RADIAL ENDOBRONCHIAL ULTRASOUND  01/22/2024   Procedure: VIDEO BRONCHOSCOPY WITH RADIAL ENDOBRONCHIAL ULTRASOUND;  Surgeon: Shelah Lamar RAMAN, MD;  Location: [facility] ENDOSCOPY;  Service: Pulmonary;;    Family History  Problem Relation Age of Onset   Asthma Mother    Diabetes Father    Heart attack Father 33   Leukemia Brother    Esophageal cancer Paternal Uncle    Lung disease Other        pat cousin   Heart attack Other        pat cousin   Colon cancer Neg Hx    Stomach cancer Neg Hx     Allergies as of 10/28/2024   (No Known Allergies)    Social History   Socioeconomic History   Marital status: Married    Spouse name: Juan Merritt   Number of children: 5   Years of education: 10   Highest education level: 10th grade  Occupational History   Occupation: retired naval architect  Tobacco Use   Smoking status: Every Day    Current packs/day: 0.00    Average packs/day: 0.3 packs/day for 40.0 years (10.0 ttl pk-yrs)    Types: Cigarettes    Start date: 01/15/1977    Last attempt to quit: 01/15/2017    Years since quitting: 7.7   Smokeless tobacco: Current    Types: Chew   Tobacco comments:    Smokes half to one pack per day for about 45 years - now also smokes e-cigarette sometimes  and chews tobacco.  Updated 12/01/2023.  Vaping Use   Vaping status: Former   Substances: Nicotine    Devices: unsure  Substance and Sexual Activity   Alcohol use: Yes    Alcohol/week: 24.0 standard drinks of alcohol    Types: 24 Cans of beer per week    Comment: pt drinks 3-4 beers a day   Drug use: Never   Sexual activity: Not Currently  Other Topics Concern   Not on file  Social History Narrative   Lives at home with wife.  Grown children.  Retired it trainer. Children live nearby.    Social Drivers of Corporate Investment Banker Strain: Low Risk  (08/22/2024)   Overall Financial Resource Strain (CARDIA)    Difficulty of Paying Living Expenses: Not hard at all  Food Insecurity: No Food Insecurity (10/01/2024)   Hunger Vital Sign    Worried About Running Out of Food in the Last Year: Never true    Ran Out of Food in the Last Year: Never true  Transportation Needs: No Transportation Needs (10/01/2024)   PRAPARE - Administrator, Civil Service (Medical): No    Lack of Transportation (Non-Medical): No  Physical Activity: Inactive (08/22/2024)   Exercise Vital Sign    Days of Exercise per Week: 0 days    Minutes of Exercise per Session: 0 min  Stress: No Stress Concern Present (08/22/2024)   [employer] of Occupational Health - Occupational Stress Questionnaire    Feeling of Stress: Not at all  Social Connections: Moderately Isolated (10/01/2024)   Social Connection and Isolation Panel    Frequency of Communication with Friends and Family: More than three times a week    Frequency of Social Gatherings with Friends and Family: Once a week    Attends Religious Services: Never    Database Administrator or Organizations: No    Attends Banker Meetings: Never    Marital Status: Married  Review of Systems   Gen: Denies fever, chills, anorexia. Denies fatigue, weakness, weight loss.  CV: Denies chest pain, palpitations, syncope, peripheral edema, and  claudication. Resp: Denies dyspnea at rest, cough, wheezing, coughing up blood, and pleurisy. GI: See HPI Derm: Denies rash, itching, dry skin Psych: Denies depression, anxiety, memory loss, confusion. No homicidal or suicidal ideation.  Heme: Denies bruising, bleeding, and enlarged lymph nodes.  Physical Exam   There were no vitals taken for this visit.  General:   Alert and oriented. No distress noted. Pleasant and cooperative.  Head:  Normocephalic and atraumatic. Eyes:  Conjuctiva clear without scleral icterus. Mouth:  Oral mucosa pink and moist. Good dentition. No lesions. Lungs:  Clear to auscultation bilaterally. No wheezes, rales, or rhonchi. No distress.  Heart:  S1, S2 present without murmurs appreciated.  Abdomen:  +BS, soft, non-tender and non-distended. No rebound or guarding. No HSM or masses noted. Rectal: *** Msk:  Symmetrical without gross deformities. Normal posture. Extremities:  Without edema. Neurologic:  Alert and  oriented x4 Psych:  Alert and cooperative. Normal mood and affect.  Assessment & Plan  BOHDEN DUNG is a 73 y.o. male presenting today with ***   Follow up   ***Follow up ***    [name] Melia, MSN, FNP-BC, AGACNP-BC [facility]

## 2024-10-28 ENCOUNTER — Encounter: Payer: Self-pay | Admitting: Gastroenterology

## 2024-10-28 ENCOUNTER — Ambulatory Visit: Admitting: Gastroenterology

## 2024-10-28 ENCOUNTER — Ambulatory Visit: Admitting: Family Medicine

## 2024-10-28 VITALS — BP 103/69 | HR 113 | Temp 97.6°F | Ht 70.5 in | Wt 230.6 lb

## 2024-10-28 DIAGNOSIS — B3781 Candidal esophagitis: Secondary | ICD-10-CM

## 2024-10-28 DIAGNOSIS — K2100 Gastro-esophageal reflux disease with esophagitis, without bleeding: Secondary | ICD-10-CM

## 2024-10-28 DIAGNOSIS — K208 Other esophagitis without bleeding: Secondary | ICD-10-CM

## 2024-10-28 MED ORDER — SUCRALFATE 1 GM/10ML PO SUSP: 1.0000 g | Freq: Four times a day (QID) | ORAL | 1 refills | Status: DC

## 2024-10-28 MED ORDER — NYSTATIN 100000 UNIT/ML MT SUSP: 5.0000 mL | Freq: Four times a day (QID) | OROMUCOSAL | 0 refills | Status: DC

## 2024-10-28 MED ORDER — BACLOFEN 10 MG PO TABS
10.0000 mg | ORAL_TABLET | Freq: Two times a day (BID) | ORAL | 0 refills | Status: DC | PRN
Start: 2024-10-28 — End: 2024-11-04

## 2024-10-28 MED ORDER — FLUCONAZOLE 200 MG PO TABS: 200.0000 mg | ORAL_TABLET | Freq: Every day | ORAL | 0 refills | Status: DC

## 2024-10-28 NOTE — Patient Instructions (Addendum)
 I have sent in baclofen for you to use up to twice a day as needed for chest burning and pain secondary to likely chronic gastritis and acid reflux.  I want you to stop taking Nexium and start taking Voquezna 20 mg once daily, preferably on every stomach.  I have also sent in the adequate treatment dose for esophageal candidiasis as it is fluconazole 200 mg daily for total 3 weeks of therapy.  I sent in an additional 10 days of the 200 given you have been on 100.  May continue the Carafate 4 times a day as well as GI cocktail (lidocaine  and Maalox) as needed in between as well to help with pain and burning.  Instead of using the Carafate tablets I have sent in Carafate suspension for you.  If you cannot get the suspension then you may dissolve the tablet in 1-2 ounces of water and mix as a slurry and then drink.  I would recommend protein supplementation to help with avoiding significant weight loss.  Sometimes herbal teas that contain eucalyptus can also help with symptoms.  At times over-the-counter FDgard which contains Caraway oil and menthol can also be soothing for the GI tract to help with burning.  Please let me know how you are doing in 2 days.  If at any point you feel like you are having severe pain and or concerned about oral intake then I would recommend returning back to the ED if this does not control pain.  It was a pleasure to see you today. I want to create trusting relationships with patients. If you receive a survey regarding your visit,  I greatly appreciate you taking time to fill this out on paper or through your MyChart. I value your feedback.  [name] Melia, MSN, FNP-BC, AGACNP-BC [facility]

## 2024-10-29 ENCOUNTER — Telehealth: Payer: Self-pay | Admitting: Gastroenterology

## 2024-10-29 NOTE — Telephone Encounter (Signed)
 Patient wife Juan Merritt called in to make follow up for 2 weeks. Patient wanted [name] to know the medication she gave him yesterday is not working yet.

## 2024-10-30 NOTE — Telephone Encounter (Signed)
 Noted

## 2024-11-01 ENCOUNTER — Telehealth: Payer: Self-pay | Admitting: *Deleted

## 2024-11-01 NOTE — Telephone Encounter (Signed)
 Pt's wife called and stated that he is still in a lot of pain and not feeling well. Spoke with [name] Melia, NP she said to go to ED. Informed pt's wife to take him to ED. She voiced understanding.

## 2024-11-04 ENCOUNTER — Ambulatory Visit: Admitting: Gastroenterology

## 2024-11-04 ENCOUNTER — Ambulatory Visit (INDEPENDENT_AMBULATORY_CARE_PROVIDER_SITE_OTHER): Admitting: Gastroenterology

## 2024-11-04 ENCOUNTER — Encounter (INDEPENDENT_AMBULATORY_CARE_PROVIDER_SITE_OTHER): Payer: Self-pay | Admitting: Gastroenterology

## 2024-11-04 VITALS — BP 156/53 | HR 109 | Temp 98.7°F | Ht 70.5 in | Wt 226.9 lb

## 2024-11-04 DIAGNOSIS — R0789 Other chest pain: Secondary | ICD-10-CM | POA: Insufficient documentation

## 2024-11-04 MED ORDER — AMITRIPTYLINE HCL 10 MG PO TABS: 10.0000 mg | ORAL_TABLET | Freq: Every day | ORAL | 0 refills | Status: DC

## 2024-11-04 NOTE — Patient Instructions (Addendum)
 Stop baclofen, Mylanta, Carafate, Voquezna and fluconazole Can continue with pantoprazole  40 mg qday Start Elavil 10 mg every night Follow closely with oncology and PCP regarding possibility of neuropathy explaining current symptoms

## 2024-11-04 NOTE — Progress Notes (Unsigned)
 Toribio Fortune, M.D. Gastroenterology & Hepatology [facility] Gastroenterology [address] [place], [place] [address]  Primary Care Physician: Zollie Lowers, MD [address] [place] [address]  I will communicate my assessment and recommendations to the referring MD via EMR.  Problems: Persistent chest pain  History of Present Illness: Juan Merritt is a 73 y.o. 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  The patient was last seen on 10/28/2024 by [name] Melia, APRN. At that time, she considered that as the patient had received an incomplete course of fluconazole by his PCP, he would benefit from increasing the dosage and extension of the medication for 10 more days.  He was also switched from Nexium to Eliquis in the and to continue with Carafate.  She also prescribed baclofen as needed.  Patient reports that he is still having persisting sensation of burning in his chest, which is worse in his middle chest and radiates to both sides of the chest, worse on the right side. Sometimes the pain can decrease for 30 minutes but it comes back. He reports having persistent pain that affects his sleeping. Has not felt any of the medications really help, except Tylenol . Has not taken any new medication besides Keytruda .   He is currently taking Voquezna 20 mg, pantoprazole  40 mg qday, Carafate eery 6 hours, baclofen, Mylanta and fluconazole.  He denies having any dysphagia or odynophagia.  Able to swallow without any difficulties.  The patient denies having any nausea, vomiting, fever, chills, hematochezia, melena, hematemesis, abdominal distention, abdominal pain, diarrhea, jaundice, pruritus or weight loss.  Last EGD 10/04/2024: - Benign-appearing esophageal  stenosis.  Dilated.   - Normal esophagus biopsied.  - 2 cm hiatal hernia.  - Normal stomach.  - Normal examined duodenum.  - Path: chronic gastritis, non specific esophageal biopsy - advised to follow up on 11/04/2024. Advised to resume eliquis and once daily PPI.   Path:  A. GE JUNCTION, BIOPSY:  - Squamous and gastric cardiac mucosa with chronic inflammation.   B. ESOPHAGEAL, BIOPSY:  - Benign squamous epithelium with no specific pathologic change.  - No histologic evidence of infection.   Last Colonoscopy: 12/01/2021 - The perianal and digital rectal examinations were normal. - Multiple diverticula were found from sigmoid to hepatic flexure. - Two sessile polyps were found in the transverse colon and ascending colon. The polyps were 4 to 8 mm in size. These polyps were removed with a cold snare. Resection and retrieval were complete. - A 5 mm polyp was found in the sigmoid colon. The polyp was flat. The polyp was removed with a cold snare. Resection and retrieval were complete. - Internal hemorrhoids were found. - The exam was otherwise without abnormality on direct and retroflexion views.  Pathology showed all polyps were tubular adenomas.  Recommended repeat colonoscopy in 3 years.  Past Medical History: Past Medical History:  Diagnosis Date   Arthritis    BPH associated with nocturia    Cancer (HCC)    stage III non-small cell lung cancer.   Chronic gout    COPD (chronic obstructive pulmonary disease) (HCC)    not on home o2   History of colon polyps    History of COVID-19    04/ 2019 hospital admission in epic, severe sepsis  due to covid w/ acute respiratory failure;   and 05/ 2021 mild to moderate symptoms, recovered at home   Hyperlipidemia, mixed    Hypertension    Phimosis    Type 2 diabetes mellitus (HCC)    followed by pcp   (05-17-2022  pt stated checks sugar multiple times daily w/ Libre,  fasting average --- 100-110)    Past Surgical History: Past Surgical  History:  Procedure Laterality Date   BRONCHIAL BIOPSY  01/22/2024   Procedure: BRONCHIAL BIOPSIES;  Surgeon: Shelah Lamar RAMAN, MD;  Location: [facility] ENDOSCOPY;  Service: Pulmonary;;   BRONCHIAL BRUSHINGS  01/22/2024   Procedure: BRONCHIAL BRUSHINGS;  Surgeon: Shelah Lamar RAMAN, MD;  Location: [facility] ENDOSCOPY;  Service: Pulmonary;;   BRONCHIAL NEEDLE ASPIRATION BIOPSY  01/22/2024   Procedure: BRONCHIAL NEEDLE ASPIRATION BIOPSIES;  Surgeon: Shelah Lamar RAMAN, MD;  Location: [facility] ENDOSCOPY;  Service: Pulmonary;;   CIRCUMCISION N/A 05/19/2022   Procedure: CIRCUMCISION ADULT;  Surgeon: Matilda Senior, MD;  Location: [facility];  Service: Urology;  Laterality: N/A;  45 MINUTES   COLONOSCOPY  12/01/2021   by dr legrand   ENDOBRONCHIAL ULTRASOUND  01/22/2024   Procedure: ENDOBRONCHIAL ULTRASOUND;  Surgeon: Shelah Lamar RAMAN, MD;  Location: [facility] ENDOSCOPY;  Service: Pulmonary;;   ESOPHAGEAL DILATION N/A 10/04/2024   Procedure: DILATION, ESOPHAGUS;  Surgeon: Eartha Flavors, Toribio, MD;  Location: [facility] ENDO SUITE;  Service: Gastroenterology;  Laterality: N/A;   ESOPHAGOGASTRODUODENOSCOPY N/A 10/04/2024   Procedure: EGD (ESOPHAGOGASTRODUODENOSCOPY);  Surgeon: Eartha Flavors, Toribio, MD;  Location: [facility] ENDO SUITE;  Service: Gastroenterology;  Laterality: N/A;   FINE NEEDLE ASPIRATION  01/22/2024   Procedure: FINE NEEDLE ASPIRATION (FNA) LINEAR;  Surgeon: Shelah Lamar RAMAN, MD;  Location: [facility] ENDOSCOPY;  Service: Pulmonary;;   LAPAROSCOPIC CHOLECYSTECTOMY  2008   in [place]   LEFT HEART CATH     TONSILLECTOMY     child   VIDEO BRONCHOSCOPY WITH RADIAL ENDOBRONCHIAL ULTRASOUND  01/22/2024   Procedure: VIDEO BRONCHOSCOPY WITH RADIAL ENDOBRONCHIAL ULTRASOUND;  Surgeon: Shelah Lamar RAMAN, MD;  Location: [facility] ENDOSCOPY;  Service: Pulmonary;;    Family History: Family History  Problem Relation Age of Onset   Asthma Mother    Diabetes Father    Heart attack Father 49   Leukemia Brother    Esophageal cancer  Paternal Uncle    Lung disease Other        pat cousin   Heart attack Other        pat cousin   Colon cancer Neg Hx    Stomach cancer Neg Hx     Social History: Social History   Tobacco Use  Smoking Status Every Day   Current packs/day: 0.00   Average packs/day: 0.3 packs/day for 40.0 years (10.0 ttl pk-yrs)   Types: Cigarettes   Start date: 01/15/1977   Last attempt to quit: 01/15/2017   Years since quitting: 7.8  Smokeless Tobacco Current   Types: Chew  Tobacco Comments   Smokes half to one pack per day for about 45 years - now also smokes e-cigarette sometimes and chews tobacco.  Updated 12/01/2023.   Social History   Substance and Sexual Activity  Alcohol Use Yes   Alcohol/week: 24.0 standard drinks of alcohol   Types: 24 Cans of beer per week   Comment: pt drinks 3-4 beers a day   Social History   Substance and Sexual Activity  Drug Use Never    Allergies: No Known Allergies  Medications: Current Outpatient Medications  Medication  Sig Dispense Refill   acetaminophen  (TYLENOL ) 325 MG tablet Take 2 tablets (650 mg total) by mouth every 6 (six) hours as needed for mild pain (pain score 1-3) or fever (or Fever >/= 101).     albuterol  (VENTOLIN  HFA) 108 (90 Base) MCG/ACT inhaler Inhale 2 puffs into the lungs every 4 (four) hours as needed for wheezing or shortness of breath. 18 each 5   allopurinol  (ZYLOPRIM ) 100 MG tablet TAKE 1 TABLET (100 MG TOTAL) BY MOUTH DAILY. TO PREVENT GOUT 90 tablet 0   apixaban (ELIQUIS) 5 MG TABS tablet Take 1 tablet (5 mg total) by mouth 2 (two) times daily. 60 tablet 5   baclofen (LIORESAL) 10 MG tablet Take 1 tablet (10 mg total) by mouth 2 (two) times daily as needed for up to 14 days. (Patient taking differently: Take 10 mg by mouth daily at 6 (six) AM.) 28 each 0   budesonide -formoterol  (SYMBICORT ) 160-4.5 MCG/ACT inhaler Inhale 2 puffs into the lungs 2 (two) times daily. 10.2 g 3   Continuous Glucose Sensor (FREESTYLE LIBRE 2  SENSOR) MISC Apply 1 Units topically once a week. USE TO TEST BLOOD SUGAR CONTINUOUSLY. Dx. E11.65 SEND TO [facility] FOR MEDICARE COVERAGE, DO NOT SEND TO LOCAL PHARMACY 4 each 4   cyanocobalamin  (VITAMIN B12) 1000 MCG tablet Take 1 tablet (1,000 mcg total) by mouth daily. 90 tablet 3   fluconazole (DIFLUCAN) 200 MG tablet Take 1 tablet (200 mg total) by mouth daily. 10 tablet 0   folic acid  (FOLATE) 400 MCG tablet Take 1 tablet (400 mcg total) by mouth daily. 30 tablet 5   ipratropium-albuterol  (DUONEB) 0.5-2.5 (3) MG/3ML SOLN Take 3 mLs by nebulization every 6 (six) hours as needed. 360 mL 6   Lactobacillus (PROBIOTIC ACIDOPHILUS PO) Take 1 capsule by mouth daily.     lidocaine  (XYLOCAINE ) 2 % solution Use as directed 15 mLs in the mouth or throat every 3 (three) hours as needed (throat pain, reflux). Will have to swallow due to presence of yeast and pain in the esophagus 200 mL 1   metFORMIN  (GLUCOPHAGE -XR) 750 MG 24 hr tablet Take 1 tablet (750 mg total) by mouth daily with breakfast. 90 tablet 3   pantoprazole  (PROTONIX ) 40 MG tablet Take 40 mg by mouth daily.     potassium chloride  SA (KLOR-CON  M20) 20 MEQ tablet TAKE 1 TABLET (20 MEQ TOTAL) DAILY BY MOUTH. (Patient taking differently: 2 (two) times daily.) 90 tablet 3   sucralfate (CARAFATE) 1 GM/10ML suspension Take 10 mLs (1 g total) by mouth 4 (four) times daily. 420 mL 1   tamsulosin  (FLOMAX ) 0.4 MG CAPS capsule TAKE 2 CAPSULES BY MOUTH AT BEDTIME FOR URINE FLOW AND PROSTATE 180 capsule 2   thiamine  (VITAMIN B-1) 100 MG tablet Take 1 tablet (100 mg total) by mouth daily. 30 tablet 5   torsemide  (DEMADEX ) 100 MG tablet Take 100 mg by mouth 2 (two) times daily.     Vonoprazan Fumarate (VOQUEZNA) 20 MG TABS Take by mouth daily at 6 (six) AM.     oxyCODONE -acetaminophen  (PERCOCET/ROXICET) 5-325 MG tablet Take 1 tablet by mouth every 6 (six) hours as needed for severe pain (pain score 7-10). (Patient not taking: Reported on 11/04/2024) 15  tablet 0   No current facility-administered medications for this visit.    Review of Systems: GENERAL: negative for malaise, night sweats HEENT: No changes in hearing or vision, no nose bleeds or other nasal problems. NECK: Negative for lumps, goiter, pain and  significant neck swelling RESPIRATORY: Negative for cough, wheezing CARDIOVASCULAR: Negative for chest pain, leg swelling, palpitations, orthopnea GI: SEE HPI MUSCULOSKELETAL: Negative for joint pain or swelling, back pain, and muscle pain. SKIN: Negative for lesions, rash PSYCH: Negative for sleep disturbance, mood disorder and recent psychosocial stressors. HEMATOLOGY Negative for prolonged bleeding, bruising easily, and swollen nodes. ENDOCRINE: Negative for cold or heat intolerance, polyuria, polydipsia and goiter. NEURO: negative for tremor, gait imbalance, syncope and seizures. The remainder of the review of systems is noncontributory.   Physical Exam: BP (!) 156/53 (BP Location: Left Arm, Patient Position: Sitting, Cuff Size: Normal)   Pulse (!) 109   Temp 98.7 F (37.1 C) (Temporal)   Ht 5' 10.5 (1.791 m)   Wt 226 lb 14.4 oz (102.9 kg)   BMI 32.10 kg/m  GENERAL: The patient is AO x3, in no acute distress. HEENT: Head is normocephalic and atraumatic. EOMI are intact. Mouth is well hydrated and without lesions. NECK: Supple. No masses LUNGS: Clear to auscultation. No presence of rhonchi/wheezing/rales. Adequate chest expansion HEART: RRR, normal s1 and s2. CHEST: There is tenderness upon palpation of the skin diffusely in the anterior part of the chest ABDOMEN: Soft, nontender, no guarding, no peritoneal signs, and nondistended. BS +. No masses. EXTREMITIES: Without any cyanosis, clubbing, rash, lesions or edema. NEUROLOGIC: AOx3, no focal motor deficit. SKIN: no jaundice, no rashes  Imaging/Labs: as above  I personally reviewed and interpreted the available labs, imaging and endoscopic files.  Impression  and Plan: Juan Merritt is a 73 y.o. 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  Patient has presented persistent issues with anterior chest pain, worse in the retrosternal area but radiated to the rest of the chest.  He is currently not presenting any clear-cut esophageal symptoms, specifically no issues with dysphagia or odynophagia.  Reports presenting persistent chest pain despite taking multiple medications.  In fact, he has been even taking combination of PPI and PCAB, along with baclofen without any relief of his symptoms.  This is rather unusual for GERD.  I suspect he does not have GERD and this is not the cause of his complaints.  He was prescribed empirically fluconazole for possible candidiasis, although endoscopically he did not have evidence of this as seen in most recent esophageal biopsies.  In fact, there was no presence of any inflammation or any esophageal endoluminal abnormalities.  I had a very thorough discussion with the patient and his wife regarding the fact I suspect his symptoms are not related to a primary gastrointestinal etiology.  Notably, he is having significant tenderness in his skin.  This symptoms appear to have started after he was started on immunotherapy.  We discussed that it is possible neuropathy related to the medication could be causing his symptoms.  We discussed that performing other testing like Bravo may not provide too much information, so we will not pursue this.  As I suspect his symptoms are most likely related to neuropathic origin, we will start him on Elavil 10 mg every night and will stop multiple medications at this moment.  I encouraged him to follow closely with oncology and PCP regarding current symptoms as well.  -Stop baclofen, Mylanta, Carafate, Voquezna  and fluconazole -Can continue with pantoprazole  40 mg qday for now, may eventually discontinue this -  Start Elavil 10 mg every night -Follow closely with oncology and PCP regarding possibility of neuropathy explaining current symptoms  All questions were answered.      Toribio Fortune, MD Gastroenterology and Hepatology [facility] Gastroenterology

## 2024-11-05 ENCOUNTER — Encounter: Payer: Self-pay | Admitting: Pharmacist

## 2024-11-06 ENCOUNTER — Other Ambulatory Visit: Payer: Self-pay | Admitting: Family Medicine

## 2024-11-06 ENCOUNTER — Telehealth: Payer: Self-pay | Admitting: Family Medicine

## 2024-11-06 DIAGNOSIS — C3491 Malignant neoplasm of unspecified part of right bronchus or lung: Secondary | ICD-10-CM | POA: Diagnosis not present

## 2024-11-06 DIAGNOSIS — C799 Secondary malignant neoplasm of unspecified site: Secondary | ICD-10-CM | POA: Diagnosis not present

## 2024-11-06 NOTE — Telephone Encounter (Signed)
 He has an appointment next week. Lets discuss then.

## 2024-11-06 NOTE — Telephone Encounter (Signed)
 Copied from CRM #[id number]. Topic: Clinical - Medication Question >> Nov 06, 2024  3:43 PM Merlynn A wrote: Reason for CRM: Patients wife called in and wants to know if he can cut back on medication metFORMIN  (GLUCOPHAGE -XR) 750 MG 24 hr tablet. Please contact patient to discuss. Patient can be reached at [phone].

## 2024-11-12 ENCOUNTER — Other Ambulatory Visit: Payer: Self-pay | Admitting: Family Medicine

## 2024-11-12 ENCOUNTER — Encounter: Payer: Self-pay | Admitting: Family Medicine

## 2024-11-12 ENCOUNTER — Ambulatory Visit (INDEPENDENT_AMBULATORY_CARE_PROVIDER_SITE_OTHER): Payer: Self-pay | Admitting: Family Medicine

## 2024-11-12 ENCOUNTER — Encounter: Admitting: Gastroenterology

## 2024-11-12 VITALS — BP 86/57 | HR 120 | Temp 97.8°F | Ht 70.5 in | Wt 220.0 lb

## 2024-11-12 DIAGNOSIS — C3411 Malignant neoplasm of upper lobe, right bronchus or lung: Secondary | ICD-10-CM

## 2024-11-12 DIAGNOSIS — Z125 Encounter for screening for malignant neoplasm of prostate: Secondary | ICD-10-CM

## 2024-11-12 DIAGNOSIS — Z794 Long term (current) use of insulin: Secondary | ICD-10-CM

## 2024-11-12 DIAGNOSIS — E1140 Type 2 diabetes mellitus with diabetic neuropathy, unspecified: Secondary | ICD-10-CM | POA: Diagnosis not present

## 2024-11-12 DIAGNOSIS — E162 Hypoglycemia, unspecified: Secondary | ICD-10-CM

## 2024-11-12 DIAGNOSIS — E861 Hypovolemia: Secondary | ICD-10-CM | POA: Diagnosis not present

## 2024-11-12 LAB — BAYER DCA HB A1C WAIVED: HB A1C (BAYER DCA - WAIVED): 6.3 % — ABNORMAL HIGH (ref 4.8–5.6)

## 2024-11-12 MED ORDER — METFORMIN HCL ER (MOD) 500 MG PO TB24: 500.0000 mg | ORAL_TABLET | Freq: Every day | ORAL | 1 refills | Status: DC

## 2024-11-12 MED ORDER — TORSEMIDE 100 MG PO TABS: 100.0000 mg | ORAL_TABLET | Freq: Every day | ORAL | Status: DC

## 2024-11-12 MED ORDER — METFORMIN HCL ER 500 MG PO TB24
500.0000 mg | ORAL_TABLET | Freq: Every day | ORAL | 2 refills | Status: AC
Start: 2024-11-12 — End: ?

## 2024-11-12 MED ORDER — TRAMADOL HCL 50 MG PO TABS: 50.0000 mg | ORAL_TABLET | Freq: Four times a day (QID) | ORAL | 2 refills | Status: AC | PRN

## 2024-11-12 NOTE — Progress Notes (Signed)
 Subjective:  Patient ID: Juan Merritt, male    DOB: [birth year]  Age: 73 y.o. MRN: [id number]  CC: Medical Management of Chronic Issues   HPI  Discussed the use of AI scribe software for clinical note transcription with the patient, who gave verbal consent to proceed.  History of Present Illness Juan Merritt is a 73 year old male with a history of cancer who presents with increased pain and weakness.  He has been experiencing increased pain, particularly on the right side, which he describes as 'aggravating'. The pain sometimes radiates to his back, which he states hurts worse than the initial site. He has been taking Tylenol  more frequently for pain management, but it only provides some relief. Gabapentin has also been prescribed, which he describes as 'slow, but heavy'.  He reports increased weakness and difficulty sleeping at night. He mentions losing approximately ten pounds in the last two weeks and attributes some of his symptoms to this weight loss. Occasional dizzy spells are noted, which he associates with low blood pressure and low blood sugar levels.  His blood sugar levels have been fluctuating, with readings as low as 58-60 in the evenings. He is currently taking metformin  750 mg once a day. He notes that he did not eat a good supper the previous night, which may have contributed to the low readings.  He mentions that his blood pressure has decreased significantly from 156 to 86 over the past week. He is currently taking torsemide , which he refers to as his 'fluid pill', twice a day. He also takes potassium supplements.  He describes skin pain across his chest, which he finds severe enough to make wearing a T-shirt uncomfortable. Gabapentin has been prescribed to help with this pain.  He has a history of cancer treatment, including chemotherapy and radiation, which concluded at the end of April. He is undergoing immune therapy and has been experiencing symptoms that he  attributes to these treatments. There is a spot on his lung that is being monitored.  His A1c is 6.3, and he has been drinking zero sugar beverages. He also notes that he had a Coke Zero, which kept him awake at night due to its caffeine content.          11/12/2024    8:38 AM 10/16/2024   10:22 AM 09/11/2024    8:03 AM  Depression screen PHQ 2/9  Decreased Interest 2 2 0  Down, Depressed, Hopeless 1 2 0  PHQ - 2 Score 3 4 0  Altered sleeping 2 2   Tired, decreased energy 1 2   Change in appetite 1 2   Feeling bad or failure about yourself  0 0   Trouble concentrating 0 1   Moving slowly or fidgety/restless 2 0   Suicidal thoughts 0 0   PHQ-9 Score 9 11    Difficult doing work/chores Somewhat difficult       Data saved with a previous flowsheet row definition    History Juan Merritt has a past medical history of Arthritis, BPH associated with nocturia, Cancer (HCC), Chronic gout, COPD (chronic obstructive pulmonary disease) (HCC), History of colon polyps, History of COVID-19, Hyperlipidemia, mixed, Hypertension, Phimosis, and Type 2 diabetes mellitus (HCC).   He has a past surgical history that includes Laparoscopic cholecystectomy (2008); Tonsillectomy; Colonoscopy (12/01/2021); Circumcision (N/A, 05/19/2022); Endobronchial ultrasound (01/22/2024); Bronchial needle aspiration biopsy (01/22/2024); Bronchial biopsy (01/22/2024); Bronchial brushings (01/22/2024); Video bronchoscopy with radial endobronchial ultrasound (01/22/2024); Fine needle aspiration (01/22/2024); Left heart  cath; Esophagogastroduodenoscopy (N/A, 10/04/2024); and Esophageal dilation (N/A, 10/04/2024).   His family history includes Asthma in his mother; Diabetes in his father; Esophageal cancer in his paternal uncle; Heart attack in an other family member; Heart attack (age of onset: 79) in his father; Leukemia in his brother; Lung disease in an other family member.He reports that he has been smoking cigarettes. He  started smoking about 47 years ago. He has a 10 pack-year smoking history. His smokeless tobacco use includes chew. He reports current alcohol use of about 24.0 standard drinks of alcohol per week. He reports that he does not use drugs.    ROS Review of Systems  Constitutional: Negative.   HENT: Negative.    Eyes:  Negative for visual disturbance.  Respiratory:  Negative for cough and shortness of breath.   Cardiovascular:  Positive for chest pain (in a band across upper right chest. No rash.). Negative for leg swelling.  Gastrointestinal:  Negative for abdominal pain, diarrhea, nausea and vomiting.  Genitourinary:  Negative for difficulty urinating.  Musculoskeletal:  Negative for arthralgias and myalgias.  Skin:  Negative for rash.  Neurological:  Negative for headaches.  Psychiatric/Behavioral:  Negative for sleep disturbance.     Objective:  BP (!) 86/57   Pulse (!) 120   Temp 97.8 F (36.6 C)   Ht 5' 10.5 (1.791 m)   Wt 220 lb (99.8 kg)   SpO2 93%   BMI 31.12 kg/m   BP Readings from Last 3 Encounters:  11/12/24 (!) 86/57  11/04/24 (!) 156/53  10/28/24 103/69    Wt Readings from Last 3 Encounters:  11/12/24 220 lb (99.8 kg)  11/04/24 226 lb 14.4 oz (102.9 kg)  10/28/24 230 lb 9.6 oz (104.6 kg)     Physical Exam Physical Exam VITALS: BP- 86/ MEASUREMENTS: Weight- 220. GENERAL: Alert, cooperative, well developed, no acute distress HEENT: Normocephalic, normal oropharynx, moist mucous membranes CHEST: Clear to auscultation bilaterally, no wheezes, rhonchi, or crackles, lungs normal CARDIOVASCULAR: Normal heart rate and rhythm, S1 and S2 normal without murmurs ABDOMEN: Soft, non-tender, non-distended, without organomegaly, normal bowel sounds EXTREMITIES: No cyanosis or edema NEUROLOGICAL: Cranial nerves grossly intact, moves all extremities without gross motor or sensory deficit   Assessment & Plan:  Type 2 diabetes mellitus with diabetic neuropathy, with  long-term current use of insulin  (HCC) -     Bayer DCA Hb A1c Waived -     Vitamin B12  Prostate cancer screening -     PSA, total and free  Other orders -     Torsemide ; Take 1 tablet (100 mg total) by mouth daily. -     metFORMIN  HCl ER (MOD); Take 1 tablet (500 mg total) by mouth daily with breakfast.  Dispense: 90 tablet; Refill: 1 -     traMADol  HCl; Take 1 tablet (50 mg total) by mouth 4 (four) times daily as needed for moderate pain (pain score 4-6). For cancer related pain  Dispense: 60 tablet; Refill: 2  Pain is located near the site of his lung tumor. Likely neuropathic related to treatments.  Assessment & Plan Malignant neoplasm of upper lobe of right lung status post radiation and immunotherapy   He experiences persistent pain and weakness, likely due to radiation-induced nerve damage. Recent CT scans show no new lymph node involvement. A CT scan is scheduled for December 2nd to further evaluate his condition. Tramadol  50 mg is prescribed four times a day as needed for moderate pain.  Chronic pain due to  cancer and prior radiation   Chronic pain is likely related to previous radiation therapy. Gabapentin is continued for nerve pain, though relief is slow. Tramadol  is prescribed for additional pain management.  Type 2 diabetes mellitus with diabetic neuropathy and recurrent hypoglycemia   He experiences recurrent hypoglycemia, with blood sugars ranging from 58-60, especially in the evening. The current metformin  dose may be too high, though his A1c is 6.3, indicating good overall control. The metformin  dose is reduced from 750 mg to 500 mg daily, and the prescription has been sent to the pharmacy.  Hypotension and dizziness   His blood pressure has decreased significantly, with recent readings as low as 86. Dizziness may be related to hypotension and his medication regimen. The torsemide  dose is reduced to once daily to help raise blood pressure. Continue potassium supplementation  as prescribed.  Unintentional weight loss and insomnia   He has lost 10 pounds over 15 days. Insomnia may be related to cancer treatment and other symptoms. Confusion and dizziness could be exacerbated by low blood sugar and dehydration. Continue to monitor weight and nutritional intake. Address low blood sugar and dehydration to improve sleep quality.  Gastroesophageal reflux disease with esophagitis and hiatal hernia   Reflux symptoms are worsened by caffeine intake, causing heartburn and discomfort after consuming caffeinated beverages. He is advised to avoid caffeine to prevent reflux symptoms.       Follow-up: Return in about 1 month (around 12/12/2024).  Butler Der, M.D.

## 2024-11-12 NOTE — Telephone Encounter (Signed)
  metFORMIN  (GLUCOPHAGE -XR) 500 MG 24 hr tablet        Changed from: metFORMIN  (GLUMETZA ) 500 MG (MOD) 24 hr tablet   Pharmacy comment: Alternative Requested:THE PRESCRIBED MEDICATION IS NOT COVERED BY INSURANCE. PLEASE CONSIDER CHANGING TO ONE OF THE SUGGESTED COVERED ALTERNATIVES.   All Pharmacy Suggested Alternatives:  metFORMIN  (GLUCOPHAGE -XR) 500 MG 24 hr tablet metFORMIN  (GLUCOPHAGE ) 500 MG tablet

## 2024-11-13 ENCOUNTER — Ambulatory Visit: Payer: Self-pay | Admitting: Family Medicine

## 2024-11-13 LAB — PSA, TOTAL AND FREE
PSA, Free Pct: 5 %
PSA, Free: 0.2 ng/mL
Prostate Specific Ag, Serum: 4 ng/mL (ref 0.0–4.0)

## 2024-11-13 LAB — VITAMIN B12: Vitamin B-12: 529 pg/mL (ref 232–1245)

## 2024-11-13 NOTE — Progress Notes (Signed)
Hello Jacobus,  Your lab result is normal and/or stable.Some minor variations that are not significant are commonly marked abnormal, but do not represent any medical problem for you.  Best regards, Hollye Pritt, M.D.

## 2024-11-18 ENCOUNTER — Emergency Department (HOSPITAL_COMMUNITY)

## 2024-11-18 ENCOUNTER — Encounter (HOSPITAL_COMMUNITY): Payer: Self-pay | Admitting: Emergency Medicine

## 2024-11-18 ENCOUNTER — Other Ambulatory Visit: Payer: Self-pay

## 2024-11-18 ENCOUNTER — Inpatient Hospital Stay (HOSPITAL_COMMUNITY)
Admission: EM | Admit: 2024-11-18 | Discharge: 2024-11-23 | DRG: 871 | Disposition: A | Attending: Internal Medicine | Admitting: Internal Medicine

## 2024-11-18 DIAGNOSIS — R008 Other abnormalities of heart beat: Secondary | ICD-10-CM | POA: Diagnosis not present

## 2024-11-18 DIAGNOSIS — Z7901 Long term (current) use of anticoagulants: Secondary | ICD-10-CM | POA: Diagnosis not present

## 2024-11-18 DIAGNOSIS — I10 Essential (primary) hypertension: Secondary | ICD-10-CM | POA: Diagnosis not present

## 2024-11-18 DIAGNOSIS — N400 Enlarged prostate without lower urinary tract symptoms: Secondary | ICD-10-CM

## 2024-11-18 DIAGNOSIS — I110 Hypertensive heart disease with heart failure: Secondary | ICD-10-CM | POA: Diagnosis not present

## 2024-11-18 DIAGNOSIS — C3411 Malignant neoplasm of upper lobe, right bronchus or lung: Secondary | ICD-10-CM | POA: Diagnosis not present

## 2024-11-18 DIAGNOSIS — Z8249 Family history of ischemic heart disease and other diseases of the circulatory system: Secondary | ICD-10-CM | POA: Diagnosis not present

## 2024-11-18 DIAGNOSIS — I959 Hypotension, unspecified: Principal | ICD-10-CM

## 2024-11-18 DIAGNOSIS — E782 Mixed hyperlipidemia: Secondary | ICD-10-CM | POA: Diagnosis present

## 2024-11-18 DIAGNOSIS — Z85118 Personal history of other malignant neoplasm of bronchus and lung: Secondary | ICD-10-CM

## 2024-11-18 DIAGNOSIS — Z79899 Other long term (current) drug therapy: Secondary | ICD-10-CM | POA: Diagnosis not present

## 2024-11-18 DIAGNOSIS — R000 Tachycardia, unspecified: Secondary | ICD-10-CM | POA: Diagnosis not present

## 2024-11-18 DIAGNOSIS — J449 Chronic obstructive pulmonary disease, unspecified: Secondary | ICD-10-CM | POA: Diagnosis present

## 2024-11-18 DIAGNOSIS — R6521 Severe sepsis with septic shock: Secondary | ICD-10-CM | POA: Diagnosis not present

## 2024-11-18 DIAGNOSIS — Z6833 Body mass index (BMI) 33.0-33.9, adult: Secondary | ICD-10-CM | POA: Diagnosis not present

## 2024-11-18 DIAGNOSIS — A419 Sepsis, unspecified organism: Secondary | ICD-10-CM | POA: Diagnosis not present

## 2024-11-18 DIAGNOSIS — D849 Immunodeficiency, unspecified: Secondary | ICD-10-CM | POA: Diagnosis not present

## 2024-11-18 DIAGNOSIS — K219 Gastro-esophageal reflux disease without esophagitis: Secondary | ICD-10-CM

## 2024-11-18 DIAGNOSIS — I5032 Chronic diastolic (congestive) heart failure: Secondary | ICD-10-CM | POA: Diagnosis not present

## 2024-11-18 DIAGNOSIS — E118 Type 2 diabetes mellitus with unspecified complications: Secondary | ICD-10-CM | POA: Diagnosis not present

## 2024-11-18 DIAGNOSIS — Z8616 Personal history of COVID-19: Secondary | ICD-10-CM | POA: Diagnosis not present

## 2024-11-18 DIAGNOSIS — F419 Anxiety disorder, unspecified: Secondary | ICD-10-CM | POA: Diagnosis not present

## 2024-11-18 DIAGNOSIS — E872 Acidosis, unspecified: Secondary | ICD-10-CM | POA: Diagnosis not present

## 2024-11-18 DIAGNOSIS — Z7984 Long term (current) use of oral hypoglycemic drugs: Secondary | ICD-10-CM | POA: Diagnosis not present

## 2024-11-18 DIAGNOSIS — N401 Enlarged prostate with lower urinary tract symptoms: Secondary | ICD-10-CM | POA: Diagnosis not present

## 2024-11-18 DIAGNOSIS — J40 Bronchitis, not specified as acute or chronic: Secondary | ICD-10-CM | POA: Diagnosis not present

## 2024-11-18 DIAGNOSIS — I471 Supraventricular tachycardia, unspecified: Secondary | ICD-10-CM | POA: Diagnosis not present

## 2024-11-18 DIAGNOSIS — E66811 Obesity, class 1: Secondary | ICD-10-CM | POA: Diagnosis not present

## 2024-11-18 DIAGNOSIS — J42 Unspecified chronic bronchitis: Secondary | ICD-10-CM | POA: Diagnosis not present

## 2024-11-18 DIAGNOSIS — Z7951 Long term (current) use of inhaled steroids: Secondary | ICD-10-CM | POA: Diagnosis not present

## 2024-11-18 DIAGNOSIS — E876 Hypokalemia: Secondary | ICD-10-CM

## 2024-11-18 DIAGNOSIS — E785 Hyperlipidemia, unspecified: Secondary | ICD-10-CM | POA: Diagnosis not present

## 2024-11-18 DIAGNOSIS — I2510 Atherosclerotic heart disease of native coronary artery without angina pectoris: Secondary | ICD-10-CM | POA: Diagnosis not present

## 2024-11-18 LAB — CBC WITH DIFFERENTIAL/PLATELET
Abs Immature Granulocytes: 0.05 K/uL (ref 0.00–0.07)
Basophils Absolute: 0 K/uL (ref 0.0–0.1)
Basophils Relative: 0 %
Eosinophils Absolute: 0 K/uL (ref 0.0–0.5)
Eosinophils Relative: 0 %
HCT: 29.6 % — ABNORMAL LOW (ref 39.0–52.0)
Hemoglobin: 9.7 g/dL — ABNORMAL LOW (ref 13.0–17.0)
Immature Granulocytes: 1 %
Lymphocytes Relative: 6 %
Lymphs Abs: 0.6 K/uL — ABNORMAL LOW (ref 0.7–4.0)
MCH: 31.1 pg (ref 26.0–34.0)
MCHC: 32.8 g/dL (ref 30.0–36.0)
MCV: 94.9 fL (ref 80.0–100.0)
Monocytes Absolute: 0.9 K/uL (ref 0.1–1.0)
Monocytes Relative: 9 %
Neutro Abs: 7.8 K/uL — ABNORMAL HIGH (ref 1.7–7.7)
Neutrophils Relative %: 84 %
Platelets: 351 K/uL (ref 150–400)
RBC: 3.12 MIL/uL — ABNORMAL LOW (ref 4.22–5.81)
RDW: 14.6 % (ref 11.5–15.5)
WBC: 9.3 K/uL (ref 4.0–10.5)
nRBC: 0 % (ref 0.0–0.2)

## 2024-11-18 LAB — COMPREHENSIVE METABOLIC PANEL WITH GFR
ALT: 22 U/L (ref 0–44)
AST: 29 U/L (ref 15–41)
Albumin: 3.3 g/dL — ABNORMAL LOW (ref 3.5–5.0)
Alkaline Phosphatase: 112 U/L (ref 38–126)
Anion gap: 16 — ABNORMAL HIGH (ref 5–15)
BUN: 15 mg/dL (ref 8–23)
CO2: 26 mmol/L (ref 22–32)
Calcium: 8.9 mg/dL (ref 8.9–10.3)
Chloride: 94 mmol/L — ABNORMAL LOW (ref 98–111)
Creatinine, Ser: 1.16 mg/dL (ref 0.61–1.24)
GFR, Estimated: >60 mL/min (ref >60)
Glucose, Bld: 142 mg/dL — ABNORMAL HIGH (ref 70–99)
Potassium: 3.3 mmol/L — ABNORMAL LOW (ref 3.5–5.1)
Sodium: 135 mmol/L (ref 135–145)
Total Bilirubin: 0.4 mg/dL (ref 0.0–1.2)
Total Protein: 7.3 g/dL (ref 6.5–8.1)

## 2024-11-18 LAB — GLUCOSE, CAPILLARY
Glucose-Capillary: 155 mg/dL — ABNORMAL HIGH (ref 70–99)
Glucose-Capillary: 139 mg/dL — ABNORMAL HIGH (ref 70–99)
Glucose-Capillary: 113 mg/dL — ABNORMAL HIGH (ref 70–99)

## 2024-11-18 LAB — CBG MONITORING, ED
Glucose-Capillary: 149 mg/dL — ABNORMAL HIGH (ref 70–99)
Glucose-Capillary: 123 mg/dL — ABNORMAL HIGH (ref 70–99)

## 2024-11-18 LAB — PROTIME-INR
INR: 1.4 — ABNORMAL HIGH (ref 0.8–1.2)
Prothrombin Time: 18.4 s — ABNORMAL HIGH (ref 11.4–15.2)

## 2024-11-18 LAB — URINALYSIS, W/ REFLEX TO CULTURE (INFECTION SUSPECTED)
Bacteria, UA: NONE SEEN
Bilirubin Urine: NEGATIVE
Glucose, UA: NEGATIVE mg/dL
Hgb urine dipstick: NEGATIVE
Ketones, ur: NEGATIVE mg/dL
Leukocytes,Ua: NEGATIVE
Nitrite: NEGATIVE
Protein, ur: NEGATIVE mg/dL
Specific Gravity, Urine: 1.011 (ref 1.005–1.030)
pH: 7 (ref 5.0–8.0)

## 2024-11-18 LAB — VITAMIN B12: Vitamin B-12: 576 pg/mL (ref 180–914)

## 2024-11-18 LAB — TSH: TSH: 2.86 u[IU]/mL (ref 0.350–4.500)

## 2024-11-18 LAB — URINE DRUG SCREEN
Amphetamines: NEGATIVE
Barbiturates: NEGATIVE
Benzodiazepines: NEGATIVE
Cocaine: NEGATIVE
Fentanyl: NEGATIVE
Methadone Scn, Ur: NEGATIVE
Opiates: NEGATIVE
Tetrahydrocannabinol: POSITIVE — AB

## 2024-11-18 LAB — ETHANOL: Alcohol, Ethyl (B): <15 mg/dL (ref <15)

## 2024-11-18 LAB — LACTIC ACID, PLASMA
Lactic Acid, Venous: 2.1 mmol/L (ref 0.5–1.9)
Lactic Acid, Venous: 1.1 mmol/L (ref 0.5–1.9)

## 2024-11-18 LAB — MRSA NEXT GEN BY PCR, NASAL: MRSA by PCR Next Gen: NOT DETECTED

## 2024-11-18 LAB — AMMONIA: Ammonia: 29 umol/L (ref 9–35)

## 2024-11-18 MED ORDER — VANCOMYCIN HCL 2000 MG/400ML IV SOLN
2000.0000 mg | Freq: Once | INTRAVENOUS | Status: AC
  Administered 2024-11-18: 2000 mg via INTRAVENOUS

## 2024-11-18 MED ORDER — APIXABAN 5 MG PO TABS
5.0000 mg | ORAL_TABLET | Freq: Two times a day (BID) | ORAL | Status: DC
  Administered 2024-11-18 – 2024-11-23 (×11): 5 mg via ORAL
  Filled 2024-11-18 (×11): qty 1

## 2024-11-18 MED ORDER — IPRATROPIUM-ALBUTEROL 0.5-2.5 (3) MG/3ML IN SOLN: 3.0000 mL | Freq: Four times a day (QID) | RESPIRATORY_TRACT | Status: DC | PRN

## 2024-11-18 MED ORDER — ALBUMIN HUMAN 25 % IV SOLN
12.5000 g | Freq: Once | INTRAVENOUS | Status: AC
  Administered 2024-11-18: 12.5 g via INTRAVENOUS
  Filled 2024-11-18: qty 50

## 2024-11-18 MED ORDER — ORAL CARE MOUTH RINSE: 15.0000 mL | OROMUCOSAL | Status: DC | PRN

## 2024-11-18 MED ORDER — NOREPINEPHRINE 4 MG/250ML-% IV SOLN
0.0000 ug/min | INTRAVENOUS | Status: DC
  Administered 2024-11-18: 2 ug/min via INTRAVENOUS
  Filled 2024-11-18: qty 250

## 2024-11-18 MED ORDER — VITAMIN B-12 1000 MCG PO TABS
1000.0000 ug | ORAL_TABLET | Freq: Every day | ORAL | Status: DC
  Administered 2024-11-18 – 2024-11-23 (×6): 1000 ug via ORAL
  Filled 2024-11-18 (×6): qty 1

## 2024-11-18 MED ORDER — FLUTICASONE FUROATE-VILANTEROL 200-25 MCG/ACT IN AEPB
1.0000 | INHALATION_SPRAY | Freq: Every day | RESPIRATORY_TRACT | Status: DC
  Administered 2024-11-18 – 2024-11-23 (×6): 1 via RESPIRATORY_TRACT
  Filled 2024-11-18 (×3): qty 28

## 2024-11-18 MED ORDER — IOHEXOL 350 MG/ML SOLN
100.0000 mL | Freq: Once | INTRAVENOUS | Status: AC | PRN
  Administered 2024-11-18: 100 mL via INTRAVENOUS

## 2024-11-18 MED ORDER — LACTATED RINGERS IV BOLUS
1000.0000 mL | Freq: Once | INTRAVENOUS | Status: AC
  Administered 2024-11-18: 1000 mL via INTRAVENOUS

## 2024-11-18 MED ORDER — INSULIN ASPART 100 UNIT/ML IJ SOLN: 0.0000 [IU] | Freq: Every day | INTRAMUSCULAR | Status: DC

## 2024-11-18 MED ORDER — VANCOMYCIN HCL 1500 MG/300ML IV SOLN
1500.0000 mg | INTRAVENOUS | Status: DC
  Administered 2024-11-19 – 2024-11-20 (×2): 1500 mg via INTRAVENOUS
  Filled 2024-11-18 (×2): qty 300

## 2024-11-18 MED ORDER — SODIUM CHLORIDE 0.9 % IV SOLN
2.0000 g | Freq: Three times a day (TID) | INTRAVENOUS | Status: DC
  Administered 2024-11-18 – 2024-11-20 (×7): 2 g via INTRAVENOUS
  Filled 2024-11-18 (×7): qty 12.5

## 2024-11-18 MED ORDER — THIAMINE MONONITRATE 100 MG PO TABS
100.0000 mg | ORAL_TABLET | Freq: Every day | ORAL | Status: DC
  Administered 2024-11-18 – 2024-11-23 (×6): 100 mg via ORAL
  Filled 2024-11-18 (×6): qty 1

## 2024-11-18 MED ORDER — ALLOPURINOL 100 MG PO TABS
100.0000 mg | ORAL_TABLET | Freq: Every day | ORAL | Status: DC
  Administered 2024-11-18 – 2024-11-23 (×6): 100 mg via ORAL
  Filled 2024-11-18 (×7): qty 1

## 2024-11-18 MED ORDER — ONDANSETRON HCL 4 MG/2ML IJ SOLN: 4.0000 mg | Freq: Four times a day (QID) | INTRAMUSCULAR | Status: DC | PRN

## 2024-11-18 MED ORDER — GABAPENTIN 300 MG PO CAPS
300.0000 mg | ORAL_CAPSULE | Freq: Two times a day (BID) | ORAL | Status: DC
  Administered 2024-11-18 – 2024-11-23 (×10): 300 mg via ORAL
  Filled 2024-11-18 (×10): qty 1

## 2024-11-18 MED ORDER — ONDANSETRON HCL 4 MG PO TABS: 4.0000 mg | ORAL_TABLET | Freq: Four times a day (QID) | ORAL | Status: DC | PRN

## 2024-11-18 MED ORDER — FOLIC ACID 1 MG PO TABS
500.0000 ug | ORAL_TABLET | Freq: Every day | ORAL | Status: DC
  Administered 2024-11-18 – 2024-11-23 (×6): 0.5 mg via ORAL
  Filled 2024-11-18 (×6): qty 1

## 2024-11-18 MED ORDER — SODIUM CHLORIDE 0.9 % IV SOLN
2.0000 g | Freq: Once | INTRAVENOUS | Status: AC
  Administered 2024-11-18: 2 g via INTRAVENOUS
  Filled 2024-11-18: qty 12.5

## 2024-11-18 MED ORDER — RISAQUAD PO CAPS
1.0000 | ORAL_CAPSULE | Freq: Every day | ORAL | Status: DC
  Administered 2024-11-18 – 2024-11-23 (×4): 1 via ORAL
  Filled 2024-11-18 (×5): qty 1

## 2024-11-18 MED ORDER — GABAPENTIN 300 MG PO CAPS: 300.0000 mg | ORAL_CAPSULE | Freq: Every day | ORAL | Status: DC

## 2024-11-18 MED ORDER — CHLORHEXIDINE GLUCONATE CLOTH 2 % EX PADS
6.0000 | MEDICATED_PAD | Freq: Every day | CUTANEOUS | Status: DC
  Administered 2024-11-18: 6 via TOPICAL

## 2024-11-18 MED ORDER — ACETAMINOPHEN 325 MG PO TABS
650.0000 mg | ORAL_TABLET | Freq: Four times a day (QID) | ORAL | Status: DC | PRN
  Administered 2024-11-20 – 2024-11-23 (×7): 650 mg via ORAL
  Filled 2024-11-18 (×7): qty 2

## 2024-11-18 MED ORDER — INSULIN ASPART 100 UNIT/ML IJ SOLN
0.0000 [IU] | Freq: Three times a day (TID) | INTRAMUSCULAR | Status: DC
  Administered 2024-11-18: 1 [IU] via SUBCUTANEOUS
  Administered 2024-11-18: 2 [IU] via SUBCUTANEOUS
  Administered 2024-11-18 – 2024-11-20 (×4): 1 [IU] via SUBCUTANEOUS
  Administered 2024-11-21: 2 [IU] via SUBCUTANEOUS
  Administered 2024-11-21 – 2024-11-22 (×2): 1 [IU] via SUBCUTANEOUS
  Administered 2024-11-23: 2 [IU] via SUBCUTANEOUS
  Filled 2024-11-18 (×10): qty 1

## 2024-11-18 MED ORDER — PANTOPRAZOLE SODIUM 40 MG PO TBEC
40.0000 mg | DELAYED_RELEASE_TABLET | Freq: Every day | ORAL | Status: DC
  Administered 2024-11-18 – 2024-11-23 (×6): 40 mg via ORAL
  Filled 2024-11-18 (×6): qty 1

## 2024-11-18 MED ORDER — POTASSIUM CHLORIDE CRYS ER 20 MEQ PO TBCR
40.0000 meq | EXTENDED_RELEASE_TABLET | ORAL | Status: AC
  Administered 2024-11-18 (×2): 40 meq via ORAL
  Filled 2024-11-18 (×2): qty 2

## 2024-11-18 MED ORDER — ACETAMINOPHEN 500 MG PO TABS
1000.0000 mg | ORAL_TABLET | Freq: Once | ORAL | Status: AC
  Administered 2024-11-18: 1000 mg via ORAL
  Filled 2024-11-18: qty 2

## 2024-11-18 MED ORDER — VANCOMYCIN HCL 10 G IV SOLR
20.0000 mg/kg | Freq: Once | INTRAVENOUS | Status: DC
  Filled 2024-11-18: qty 20

## 2024-11-18 MED ORDER — LIDOCAINE VISCOUS HCL 2 % MT SOLN
15.0000 mL | OROMUCOSAL | Status: DC | PRN
  Administered 2024-11-18 – 2024-11-20 (×2): 15 mL via OROMUCOSAL
  Filled 2024-11-18: qty 15

## 2024-11-18 NOTE — ED Notes (Signed)
 Patient transported to CT

## 2024-11-18 NOTE — Assessment & Plan Note (Signed)
-   Status postradiation and chemotherapy - Actively receiving immunotherapy until recently - Continue patient follow-up with oncology service.

## 2024-11-18 NOTE — Assessment & Plan Note (Signed)
-   Replete potassium and check magnesium  level - Telemetry monitoring in place.

## 2024-11-18 NOTE — H&P (Signed)
 History and Physical    Patient: Juan Merritt FMW:[id number] DOB: 1951-07-17 DOA: 11/18/2024 DOS: the patient was seen and examined on 11/18/2024 PCP: Zollie Lowers, MD   Patient coming from: Home  Chief Complaint:  Chief Complaint  Patient presents with   Weakness   HPI: KAYL STOGDILL is a 73 y.o. male with medical history significant of chronic diastolic heart failure, history of lung cancer (status post radiation/chemotherapy and receiving immunotherapy), history of blood clots, COPD, BPH, hyperlipidemia, type 2 diabetes mellitus and GERD; who presented to the hospital secondary to burning chest discomfort.  Patient also expressed having some intermittent weakness/confusion and chills.  Patient denies sick contacts, focal weaknesses, nausea/vomiting, dysuria, hematuria, melena, hematochezia, or any other complaints.   Workup in the ED demonstrated patient with soft blood pressure, febrile with a Tmax of 101.4 and the presence of hypertension.  Patient lactic acid was also elevated.  CT chest with concern for bronchitic changes, negative for pulmonary embolism findings and frank infiltrates.  CT abdomen and pelvis unremarkable.  Given history of cancer/immunosuppression broad-spectrum antibiotics started after cultures taken.  Patient received 3.5 L of fluid resuscitation without improvement in his blood pressure and in the requiring pressors initiation (peripherally).    [facility] has been contacted to place patient in the hospital for further evaluation and management.    Review of Systems: As mentioned in the history of present illness. All other systems reviewed and are negative. Past Medical History:  Diagnosis Date   Arthritis    BPH associated with nocturia    Cancer (HCC)    stage III non-small cell lung cancer.   Chronic gout    COPD (chronic obstructive pulmonary disease) (HCC)    not on home o2   History of colon polyps    History of COVID-19    04/ 2019 hospital  admission in epic, severe sepsis due to covid w/ acute respiratory failure;   and 05/ 2021 mild to moderate symptoms, recovered at home   Hyperlipidemia, mixed    Hypertension    Phimosis    Type 2 diabetes mellitus (HCC)    followed by pcp   (05-17-2022  pt stated checks sugar multiple times daily w/ Libre,  fasting average --- 100-110)   Past Surgical History:  Procedure Laterality Date   BRONCHIAL BIOPSY  01/22/2024   Procedure: BRONCHIAL BIOPSIES;  Surgeon: Shelah Lamar RAMAN, MD;  Location: [facility] ENDOSCOPY;  Service: Pulmonary;;   BRONCHIAL BRUSHINGS  01/22/2024   Procedure: BRONCHIAL BRUSHINGS;  Surgeon: Shelah Lamar RAMAN, MD;  Location: [facility] ENDOSCOPY;  Service: Pulmonary;;   BRONCHIAL NEEDLE ASPIRATION BIOPSY  01/22/2024   Procedure: BRONCHIAL NEEDLE ASPIRATION BIOPSIES;  Surgeon: Shelah Lamar RAMAN, MD;  Location: [facility] ENDOSCOPY;  Service: Pulmonary;;   CIRCUMCISION N/A 05/19/2022   Procedure: CIRCUMCISION ADULT;  Surgeon: Matilda Senior, MD;  Location: [facility];  Service: Urology;  Laterality: N/A;  45 MINUTES   COLONOSCOPY  12/01/2021   by dr legrand   ENDOBRONCHIAL ULTRASOUND  01/22/2024   Procedure: ENDOBRONCHIAL ULTRASOUND;  Surgeon: Shelah Lamar RAMAN, MD;  Location: [facility] ENDOSCOPY;  Service: Pulmonary;;   ESOPHAGEAL DILATION N/A 10/04/2024   Procedure: DILATION, ESOPHAGUS;  Surgeon: Eartha Flavors, Toribio, MD;  Location: [facility] ENDO SUITE;  Service: Gastroenterology;  Laterality: N/A;   ESOPHAGOGASTRODUODENOSCOPY N/A 10/04/2024   Procedure: EGD (ESOPHAGOGASTRODUODENOSCOPY);  Surgeon: Eartha Flavors, Toribio, MD;  Location: [facility] ENDO SUITE;  Service: Gastroenterology;  Laterality: N/A;   FINE NEEDLE ASPIRATION  01/22/2024   Procedure:  FINE NEEDLE ASPIRATION (FNA) LINEAR;  Surgeon: Shelah Lamar RAMAN, MD;  Location: [facility] ENDOSCOPY;  Service: Pulmonary;;   LAPAROSCOPIC CHOLECYSTECTOMY  2008   in [place]   LEFT HEART CATH     TONSILLECTOMY     child   VIDEO BRONCHOSCOPY WITH RADIAL  ENDOBRONCHIAL ULTRASOUND  01/22/2024   Procedure: VIDEO BRONCHOSCOPY WITH RADIAL ENDOBRONCHIAL ULTRASOUND;  Surgeon: Shelah Lamar RAMAN, MD;  Location: [facility] ENDOSCOPY;  Service: Pulmonary;;   Social History:  reports that he quit smoking about 7 years ago. His smoking use included cigarettes. He started smoking about 47 years ago. He has a 10 pack-year smoking history. His smokeless tobacco use includes chew. He reports that he does not currently use alcohol after a past usage of about 24.0 standard drinks of alcohol per week. He reports that he does not use drugs.  Allergies  Allergen Reactions   Statins Other (See Comments)    Myopathy     Family History  Problem Relation Age of Onset   Asthma Mother    Diabetes Father    Heart attack Father 18   Leukemia Brother    Esophageal cancer Paternal Uncle    Lung disease Other        pat cousin   Heart attack Other        pat cousin   Colon cancer Neg Hx    Stomach cancer Neg Hx     Prior to Admission medications   Medication Sig Start Date End Date Taking? Authorizing Provider  acetaminophen  (TYLENOL ) 325 MG tablet Take 2 tablets (650 mg total) by mouth every 6 (six) hours as needed for mild pain (pain score 1-3) or fever (or Fever >/= 101). 10/04/24   Pearlean Manus, MD  albuterol  (VENTOLIN  HFA) 108 (90 Base) MCG/ACT inhaler Inhale 2 puffs into the lungs every 4 (four) hours as needed for wheezing or shortness of breath. 10/04/24   Pearlean Manus, MD  allopurinol  (ZYLOPRIM ) 100 MG tablet TAKE 1 TABLET (100 MG TOTAL) BY MOUTH DAILY. TO PREVENT GOUT 05/07/24   Zollie Lowers, MD  apixaban  (ELIQUIS ) 5 MG TABS tablet Take 1 tablet (5 mg total) by mouth 2 (two) times daily. 10/04/24   Pearlean Manus, MD  budesonide -formoterol  (SYMBICORT ) 160-4.5 MCG/ACT inhaler Inhale 2 puffs into the lungs 2 (two) times daily. 10/04/24   Pearlean Manus, MD  Continuous Glucose Sensor (FREESTYLE LIBRE 2 SENSOR) MISC Apply 1 Units topically once a week. USE  TO TEST BLOOD SUGAR CONTINUOUSLY. Dx. E11.65 SEND TO [facility] FOR MEDICARE COVERAGE, DO NOT SEND TO LOCAL PHARMACY 10/04/24   Pearlean Manus, MD  cyanocobalamin  (VITAMIN B12) 1000 MCG tablet Take 1 tablet (1,000 mcg total) by mouth daily. 10/04/24   Pearlean Manus, MD  folic acid  (FOLATE) 400 MCG tablet Take 1 tablet (400 mcg total) by mouth daily. 10/04/24   Pearlean Manus, MD  gabapentin (NEURONTIN) 300 MG capsule Take 300 mg by mouth once daily 11/06/24   [provider]  ipratropium-albuterol  (DUONEB) 0.5-2.5 (3) MG/3ML SOLN Take 3 mLs by nebulization every 6 (six) hours as needed. 10/04/24   Pearlean Manus, MD  Lactobacillus (PROBIOTIC ACIDOPHILUS PO) Take 1 capsule by mouth daily.    [provider]  lidocaine  (XYLOCAINE ) 2 % solution Use as directed 15 mLs in the mouth or throat every 3 (three) hours as needed (throat pain, reflux). Will have to swallow due to presence of yeast and pain in the esophagus 10/16/24   Zollie Lowers, MD  metFORMIN  (GLUCOPHAGE -XR) 500 MG  24 hr tablet Take 1 tablet (500 mg total) by mouth daily with breakfast. 11/12/24   Zollie Lowers, MD  potassium chloride  SA (KLOR-CON  M20) 20 MEQ tablet TAKE 1 TABLET (20 MEQ TOTAL) DAILY BY MOUTH. Patient taking differently: 2 (two) times daily. 08/07/24   Zollie Lowers, MD  tamsulosin  (FLOMAX ) 0.4 MG CAPS capsule TAKE 2 CAPSULES BY MOUTH AT BEDTIME FOR URINE FLOW AND PROSTATE 03/28/24   Zollie Lowers, MD  thiamine  (VITAMIN B-1) 100 MG tablet Take 1 tablet (100 mg total) by mouth daily. 10/05/24   Pearlean Manus, MD  torsemide  (DEMADEX ) 100 MG tablet Take 1 tablet (100 mg total) by mouth daily. 11/12/24   Zollie Lowers, MD  traMADol  (ULTRAM ) 50 MG tablet Take 1 tablet (50 mg total) by mouth 4 (four) times daily as needed for moderate pain (pain score 4-6). For cancer related pain 11/12/24   Zollie Lowers, MD    Physical Exam: Vitals:   11/18/24 0728 11/18/24 0730 11/18/24 0732 11/18/24  0734  BP: 95/62 90/68 101/71 109/66  Pulse: 87 72 77 75  Resp:      Temp:      TempSrc:      SpO2: 93% 100% 96% 97%  Weight:      Height:       General exam: Alert, awake, following commands appropriately and in no major distress.  Reports feeling weak. Respiratory system: Good saturation on room air; no using accessory muscles.  No wheezing or crackles appreciated on exam. Cardiovascular system: Rate controlled, no rubs, no gallops, no JVD. Gastrointestinal system: Abdomen is nondistended, soft and nontender. No organomegaly or masses felt. Normal bowel sounds heard. Central nervous system: Moving 4 limbs spontaneously.  No focal neurological deficits. Extremities: No cyanosis or clubbing. Skin: No petechiae. Psychiatry: Judgement and insight appear normal.  Flat affect appreciated on exam.  Data Reviewed: Urine drug screen: Positive for marijuana Urinalysis: Specific gravity 1.011, negative nitrite, negative leukocyte esterase and negative hemoglobin urine dipstick. Comprehensive metabolic panel: Sodium 135, potassium 3.3, chloride 94, bicarb 26, BUN 15, creatinine 1.16, normal LFTs and GFR >60 CBC: WBCs 9.3, hemoglobin 9.7 and platelets count 351K Ammonia: 29 Lactic acid: 2.1  Assessment and Plan: Assessment & Plan Septic shock (HCC) - Presumed septic shock at time of admission - Patient with elevated lactic acid, failure to respond on fluid resuscitation requiring pressors; he was slightly tachycardic and with positive fever. - Images suggesting the presence of most likely bronchitis. - Rest Bactrim antibiotic has been initiated - Follow culture results - Follow clinical response - Continue supportive care. COPD (chronic obstructive pulmonary disease) (HCC) - No acute exacerbation appreciated - Continue bronchodilator management. Primary hypertension - Holding antihypertensive agents as patient presented with concern for septic shock and is requiring pressors. - Follow  vital signs. Mixed hyperlipidemia - Heart healthy diet discussed with patient - Patient is currently not using statin. Chronic diastolic heart failure (HCC) - Stable and compensated - In the setting of presumed septic shock presentation holding diuretics and patient has received fluid resuscitation - Follow daily weights and strict intake and output - Heart healthy/low-sodium diet discussed with patient. History of lung cancer - Status postradiation and chemotherapy - Actively receiving immunotherapy until recently - Continue patient follow-up with oncology service. GERD (gastroesophageal reflux disease) - Continue PPI. Hypokalemia - Replete potassium and check magnesium  level - Telemetry monitoring in place. Type 2 diabetes mellitus with complication, without long-term current use of insulin  (HCC) - Update A1c - Sensitive sliding scale insulin   has been initiated - Follow CBG fluctuation and adjust medication as needed.. - Continue modified carbohydrate diet. BPH (benign prostatic hyperplasia) - Complaints of urinary retention currently reported - Will resume the use of Flomax  once blood pressure further stabilized.  Advance Care Planning:   Code Status: Full Code   Consults: None  Family Communication: No family at bedside.  Severity of Illness: The appropriate patient status for this patient is INPATIENT. Inpatient status is judged to be reasonable and necessary in order to provide the required intensity of service to ensure the patient's safety. The patient's presenting symptoms, physical exam findings, and initial radiographic and laboratory data in the context of their chronic comorbidities is felt to place them at high risk for further clinical deterioration. Furthermore, it is not anticipated that the patient will be medically stable for discharge from the hospital within 2 midnights of admission.   * I certify that at the point of admission it is my clinical judgment that  the patient will require inpatient hospital care spanning beyond 2 midnights from the point of admission due to high intensity of service, high risk for further deterioration and high frequency of surveillance required.*  Author: Eric Nunnery, MD 11/18/2024 7:52 AM  For on call review [email or web address].

## 2024-11-18 NOTE — Assessment & Plan Note (Signed)
 Continue PPI.

## 2024-11-18 NOTE — Progress Notes (Signed)
 Pharmacy Antibiotic Note  Juan Merritt is a 73 y.o. male admitted on 11/18/2024 with sepsis.  Pharmacy has been consulted for vancomycin  dosing.  Plan: Vancomycin  2000 mg IV x 1 dose Vancomycin  1500 mg IV every 24 hours Cefepime  2000 mg IV every 8 hours Monitor labs, c/s, and vanco levels as indicated.  Height: 5' 10.5 (179.1 cm) Weight: 99.8 kg (220 lb 0.3 oz) IBW/kg (Calculated) : 74.15  Temp (24hrs), Avg:99.2 F (37.3 C), Min:97.6 F (36.4 C), Max:101.4 F (38.6 C)  Recent Labs  Lab 11/18/24 0151 11/18/24 0421  WBC 9.3  --   CREATININE 1.16  --   LATICACIDVEN 2.1* 1.1    Estimated Creatinine Clearance: 69.7 mL/min (by C-G formula based on SCr of 1.16 mg/dL).    Allergies  Allergen Reactions   Statins Other (See Comments)    Myopathy     Antimicrobials this admission: Vanco 12/1 >> Cefepime  12/1 >>  Microbiology results: 12/1 BCx: pending  12/1 MRSA PCR: pending  Thank you for allowing pharmacy to be a part of this patient's care.  Elspeth Sour, PharmD Clinical Pharmacist 11/18/2024 8:47 AM

## 2024-11-18 NOTE — Assessment & Plan Note (Signed)
-   Heart healthy diet discussed with patient - Patient is currently not using statin.

## 2024-11-18 NOTE — Sepsis Progress Note (Signed)
 Elink following code sepsis

## 2024-11-18 NOTE — Assessment & Plan Note (Signed)
-   Stable and compensated - In the setting of presumed septic shock presentation holding diuretics and patient has received fluid resuscitation - Follow daily weights and strict intake and output - Heart healthy/low-sodium diet discussed with patient.

## 2024-11-18 NOTE — ED Triage Notes (Signed)
 Pt states he got up to go to restroom and felt sudden onset of weakness and sat in the floor. Pt was hypotensive when ems arrived and they gave pt 500cc of iv fluid. They states he is hot to touch and tachy. Pt is a cancer pt.

## 2024-11-18 NOTE — Assessment & Plan Note (Signed)
-   Complaints of urinary retention currently reported - Will resume the use of Flomax  once blood pressure further stabilized.

## 2024-11-18 NOTE — ED Notes (Addendum)
Blood cultures have been drawn by lab

## 2024-11-18 NOTE — Assessment & Plan Note (Signed)
-   Holding antihypertensive agents as patient presented with concern for septic shock and is requiring pressors. - Follow vital signs.

## 2024-11-18 NOTE — Assessment & Plan Note (Signed)
-   Presumed septic shock at time of admission - Patient with elevated lactic acid, failure to respond on fluid resuscitation requiring pressors; he was slightly tachycardic and with positive fever. - Images suggesting the presence of most likely bronchitis. - Rest Bactrim antibiotic has been initiated - Follow culture results - Follow clinical response - Continue supportive care.

## 2024-11-18 NOTE — ED Provider Notes (Signed)
 Emergency Department Provider Note  TRIAGE NOTE: Pt states he got up to go to restroom and felt sudden onset of weakness and sat in the floor. Pt was hypotensive when ems arrived and they gave pt 500cc of iv fluid. They states he is hot to touch and tachy. Pt is a cancer pt.   HISTORY  Chief Complaint Weakness   HPI Juan Merritt is a 73 y.o. male with  a history of lung cancer, who presents with chest burning and an episode of near syncope earlier this evening. The burning sensation in the chest has been persistent for several months, reportedly worsening recently. The patient describes the burning as different from previous GERD symptoms. He experienced chest pain and hyperventilation, leading to increased anxiety. He has a history of GERD, but recent endoscopy and follow-up suggested the burning may be related to radiation-induced nerve issues. The patient had completed radiation and chemotherapy for lung cancer in April and was receiving immunotherapy until recently. He was prescribed Neurontin  300 mg once daily for the chest burning. Earlier today, he consumed a burger and subsequently felt nauseated, for which he took Zofran . He denies recent smoking but admits to occasional chewing tobacco use. He has been experiencing increased confusion and agitation. The history was obtained from both the patient and his companion. EMS noted low BP's 500cc fluid given.   PMH Past Medical History:  Diagnosis Date   Arthritis    BPH associated with nocturia    Cancer (HCC)    stage III non-small cell lung cancer.   Chronic gout    COPD (chronic obstructive pulmonary disease) (HCC)    not on home o2   History of colon polyps    History of COVID-19    04/ 2019 hospital admission in epic, severe sepsis due to covid w/ acute respiratory failure;   and 05/ 2021 mild to moderate symptoms, recovered at home   Hyperlipidemia, mixed    Hypertension    Phimosis    Type 2 diabetes mellitus (HCC)     followed by pcp   (05-17-2022  pt stated checks sugar multiple times daily w/ Libre,  fasting average --- 100-110)    Home Medications Prior to Admission medications   Medication Sig Start Date End Date Taking? Authorizing Provider  acetaminophen  (TYLENOL ) 325 MG tablet Take 2 tablets (650 mg total) by mouth every 6 (six) hours as needed for mild pain (pain score 1-3) or fever (or Fever >/= 101). 10/04/24   Pearlean Manus, MD  albuterol  (VENTOLIN  HFA) 108 (90 Base) MCG/ACT inhaler Inhale 2 puffs into the lungs every 4 (four) hours as needed for wheezing or shortness of breath. 10/04/24   Emokpae, Courage, MD  allopurinol  (ZYLOPRIM ) 100 MG tablet TAKE 1 TABLET (100 MG TOTAL) BY MOUTH DAILY. TO PREVENT GOUT 05/07/24   Zollie Lowers, MD  apixaban  (ELIQUIS ) 5 MG TABS tablet Take 1 tablet (5 mg total) by mouth 2 (two) times daily. 10/04/24   Pearlean Manus, MD  budesonide -formoterol  (SYMBICORT ) 160-4.5 MCG/ACT inhaler Inhale 2 puffs into the lungs 2 (two) times daily. 10/04/24   Pearlean Manus, MD  Continuous Glucose Sensor (FREESTYLE LIBRE 2 SENSOR) MISC Apply 1 Units topically once a week. USE TO TEST BLOOD SUGAR CONTINUOUSLY. Dx. E11.65 SEND TO [facility] FOR MEDICARE COVERAGE, DO NOT SEND TO LOCAL PHARMACY 10/04/24   Pearlean Manus, MD  cyanocobalamin  (VITAMIN B12) 1000 MCG tablet Take 1 tablet (1,000 mcg total) by mouth daily. 10/04/24   Emokpae, Courage,  MD  folic acid  (FOLATE) 400 MCG tablet Take 1 tablet (400 mcg total) by mouth daily. 10/04/24   Pearlean Manus, MD  gabapentin  (NEURONTIN ) 300 MG capsule Take 300 mg by mouth once daily 11/06/24   [provider]  ipratropium-albuterol  (DUONEB) 0.5-2.5 (3) MG/3ML SOLN Take 3 mLs by nebulization every 6 (six) hours as needed. 10/04/24   Pearlean Manus, MD  Lactobacillus (PROBIOTIC ACIDOPHILUS PO) Take 1 capsule by mouth daily.    [provider]  lidocaine  (XYLOCAINE ) 2 % solution Use as directed 15 mLs in the  mouth or throat every 3 (three) hours as needed (throat pain, reflux). Will have to swallow due to presence of yeast and pain in the esophagus 10/16/24   Zollie Lowers, MD  metFORMIN  (GLUCOPHAGE -XR) 500 MG 24 hr tablet Take 1 tablet (500 mg total) by mouth daily with breakfast. 11/12/24   Zollie Lowers, MD  potassium chloride  SA (KLOR-CON  M20) 20 MEQ tablet TAKE 1 TABLET (20 MEQ TOTAL) DAILY BY MOUTH. Patient taking differently: 2 (two) times daily. 08/07/24   Zollie Lowers, MD  tamsulosin  (FLOMAX ) 0.4 MG CAPS capsule TAKE 2 CAPSULES BY MOUTH AT BEDTIME FOR URINE FLOW AND PROSTATE 03/28/24   Zollie Lowers, MD  thiamine  (VITAMIN B-1) 100 MG tablet Take 1 tablet (100 mg total) by mouth daily. 10/05/24   Pearlean Manus, MD  torsemide  (DEMADEX ) 100 MG tablet Take 1 tablet (100 mg total) by mouth daily. 11/12/24   Zollie Lowers, MD  traMADol  (ULTRAM ) 50 MG tablet Take 1 tablet (50 mg total) by mouth 4 (four) times daily as needed for moderate pain (pain score 4-6). For cancer related pain 11/12/24   Zollie Lowers, MD    Social History Social History   Tobacco Use   Smoking status: Former    Current packs/day: 0.00    Average packs/day: 0.3 packs/day for 40.0 years (10.0 ttl pk-yrs)    Types: Cigarettes    Start date: 01/15/1977    Quit date: 01/15/2017    Years since quitting: 7.8   Smokeless tobacco: Current    Types: Chew   Tobacco comments:    Smokes half to one pack per day for about 45 years - now also smokes e-cigarette sometimes and chews tobacco.  Updated 12/01/2023.  Vaping Use   Vaping status: Former   Substances: Nicotine    Devices: unsure  Substance Use Topics   Alcohol use: Not Currently    Alcohol/week: 24.0 standard drinks of alcohol    Types: 24 Cans of beer per week    Comment: pt drinks 3-4 beers a day   Drug use: Never    Review of Systems: Documented in HPI ____________________________________________  PHYSICAL EXAM: VITAL SIGNS: Triage: Blood pressure  99/64, pulse (!) 118, temperature (!) 101.4 F (38.6 C), temperature source Rectal, resp. rate 20, height 5' 10.5 (1.791 m), weight 99.8 kg, SpO2 95%.  Vitals:   11/18/24 0718 11/18/24 0720 11/18/24 0722 11/18/24 0724  BP: (!) 84/52 (!) 88/55 91/61 (!) 97/57  Pulse: 82 85 76 71  Resp:      Temp:      TempSrc:      SpO2: 100% 99% 98% 99%  Weight:      Height:        Physical Exam Vitals and nursing note reviewed.  Constitutional:      Appearance: He is well-developed.  HENT:     Head: Normocephalic and atraumatic.  Cardiovascular:     Rate and Rhythm: Tachycardia present.  Pulmonary:  Effort: Pulmonary effort is normal. No respiratory distress.     Comments: Diminished Tachypneic Hypoxic to 87 on RA, 93 on 2L Abdominal:     General: There is no distension.  Musculoskeletal:        General: Normal range of motion.     Cervical back: Normal range of motion.     Right lower leg: No edema.     Left lower leg: No edema.  Neurological:     Mental Status: He is alert.       ____________________________________________   LABS (all labs ordered are listed, but only abnormal results are displayed)  Labs Reviewed  COMPREHENSIVE METABOLIC PANEL WITH GFR - Abnormal; Notable for the following components:      Result Value   Potassium 3.3 (*)    Chloride 94 (*)    Glucose, Bld 142 (*)    Albumin  3.3 (*)    Anion gap 16 (*)    All other components within normal limits  CBC WITH DIFFERENTIAL/PLATELET - Abnormal; Notable for the following components:   RBC 3.12 (*)    Hemoglobin 9.7 (*)    HCT 29.6 (*)    Neutro Abs 7.8 (*)    Lymphs Abs 0.6 (*)    All other components within normal limits  LACTIC ACID, PLASMA - Abnormal; Notable for the following components:   Lactic Acid, Venous 2.1 (*)    All other components within normal limits  URINE DRUG SCREEN - Abnormal; Notable for the following components:   Tetrahydrocannabinol POSITIVE (*)    All other components within  normal limits  PROTIME-INR - Abnormal; Notable for the following components:   Prothrombin Time 18.4 (*)    INR 1.4 (*)    All other components within normal limits  CBG MONITORING, ED - Abnormal; Notable for the following components:   Glucose-Capillary 149 (*)    All other components within normal limits  CULTURE, BLOOD (ROUTINE X 2)  CULTURE, BLOOD (ROUTINE X 2)  AMMONIA  ETHANOL  URINALYSIS, W/ REFLEX TO CULTURE (INFECTION SUSPECTED)  LACTIC ACID, PLASMA   ____________________________________________  EKG   EKG Interpretation Date/Time:  Monday November 18 2024 00:55:36 EST Ventricular Rate:  125 PR Interval:  158 QRS Duration:  101 QT Interval:  311 QTC Calculation: 449 R Axis:   77  Text Interpretation: Sinus tachycardia Probable left atrial enlargement Borderline T abnormalities, inferior leads Confirmed by Lorette Mayo [phone]) on 11/18/2024 2:10:09 AM        ____________________________________________  RADIOLOGY  CT Angio Chest PE W and/or Wo Contrast Result Date: 11/18/2024 EXAM: CTA CHEST PE WITH CONTRAST CT ABDOMEN AND PELVIS WITH CONTRAST 11/18/2024 03:21:44 AM TECHNIQUE: CTA of the chest was performed with the administration of 100 mL of iohexol  (OMNIPAQUE ) 350 MG/ML injection. Multiplanar reformatted images are provided for review. MIP images are provided for review. CT of the abdomen and pelvis was performed with the administration of intravenous contrast. Automated exposure control, iterative reconstruction, and/or weight based adjustment of the mA/kV was utilized to reduce the radiation dose to as low as reasonably achievable. COMPARISON: Same-day chest x-ray. CT dated 10/18/2024. CLINICAL HISTORY: Patient felt sudden onset of weakness and was hypotensive, tachycardic, cancer patient, PE suspected. FINDINGS: CHEST: PULMONARY ARTERIES: Pulmonary arteries are adequately opacified for evaluation. Negative for pulmonary embolism. Main pulmonary artery is normal in  caliber. MEDIASTINUM: No mediastinal lymphadenopathy. The heart demonstrates coronary artery atherosclerotic calcifications. Slightly increased small pericardial effusion from 10/18/2024. Aortic atherosclerotic calcifications. There is no acute  abnormality of the thoracic aorta. LUNGS AND PLEURA: Bronchial wall thickening with mucous plugging greatest in the lower lobes. Irregular nodule in the right upper lobe on series 10 image 24 is unchanged again measuring 2.3 x 1.1 cm. No focal consolidation or pulmonary edema. No pleural effusion or pneumothorax. SOFT TISSUES AND BONES: No acute bone or soft tissue abnormality. ABDOMEN AND PELVIS: LIVER: The liver is unremarkable. GALLBLADDER AND BILE DUCTS: Cholecystectomy. No biliary ductal dilatation. SPLEEN: Spleen demonstrates no acute abnormality. PANCREAS: Pancreas demonstrates no acute abnormality. ADRENAL GLANDS: Adrenal glands demonstrate no acute abnormality. KIDNEYS, URETERS AND BLADDER: No stones in the kidneys or ureters. No hydronephrosis. No perinephric or periureteral stranding. Urinary bladder is unremarkable. GI AND BOWEL: Stomach and duodenal sweep demonstrate no acute abnormality. There is no bowel obstruction. No abnormal bowel wall thickening or distension. REPRODUCTIVE: Reproductive organs are unremarkable. PERITONEUM AND RETROPERITONEUM: No ascites or free air. LYMPH NODES: No lymphadenopathy. BONES AND SOFT TISSUES: No acute abnormality of the visualized bones. No focal soft tissue abnormality. IMPRESSION: 1. No pulmonary embolism. 2. Slightly increased small pericardial effusion from 10/18/24. 3. Irregular right upper lobe pulmonary nodule, unchanged at 2.3 x 1.1 cm. 4. No acute abnormality in the abdomen or pelvis. Electronically signed by: Norman Gatlin MD 11/18/2024 03:39 AM EST RP Workstation: [id number]   CT ABDOMEN PELVIS W CONTRAST Result Date: 11/18/2024 EXAM: CTA CHEST PE WITH CONTRAST CT ABDOMEN AND PELVIS WITH CONTRAST 11/18/2024  03:21:44 AM TECHNIQUE: CTA of the chest was performed with the administration of 100 mL of iohexol  (OMNIPAQUE ) 350 MG/ML injection. Multiplanar reformatted images are provided for review. MIP images are provided for review. CT of the abdomen and pelvis was performed with the administration of intravenous contrast. Automated exposure control, iterative reconstruction, and/or weight based adjustment of the mA/kV was utilized to reduce the radiation dose to as low as reasonably achievable. COMPARISON: Same-day chest x-ray. CT dated 10/18/2024. CLINICAL HISTORY: Patient felt sudden onset of weakness and was hypotensive, tachycardic, cancer patient, PE suspected. FINDINGS: CHEST: PULMONARY ARTERIES: Pulmonary arteries are adequately opacified for evaluation. Negative for pulmonary embolism. Main pulmonary artery is normal in caliber. MEDIASTINUM: No mediastinal lymphadenopathy. The heart demonstrates coronary artery atherosclerotic calcifications. Slightly increased small pericardial effusion from 10/18/2024. Aortic atherosclerotic calcifications. There is no acute abnormality of the thoracic aorta. LUNGS AND PLEURA: Bronchial wall thickening with mucous plugging greatest in the lower lobes. Irregular nodule in the right upper lobe on series 10 image 24 is unchanged again measuring 2.3 x 1.1 cm. No focal consolidation or pulmonary edema. No pleural effusion or pneumothorax. SOFT TISSUES AND BONES: No acute bone or soft tissue abnormality. ABDOMEN AND PELVIS: LIVER: The liver is unremarkable. GALLBLADDER AND BILE DUCTS: Cholecystectomy. No biliary ductal dilatation. SPLEEN: Spleen demonstrates no acute abnormality. PANCREAS: Pancreas demonstrates no acute abnormality. ADRENAL GLANDS: Adrenal glands demonstrate no acute abnormality. KIDNEYS, URETERS AND BLADDER: No stones in the kidneys or ureters. No hydronephrosis. No perinephric or periureteral stranding. Urinary bladder is unremarkable. GI AND BOWEL: Stomach and  duodenal sweep demonstrate no acute abnormality. There is no bowel obstruction. No abnormal bowel wall thickening or distension. REPRODUCTIVE: Reproductive organs are unremarkable. PERITONEUM AND RETROPERITONEUM: No ascites or free air. LYMPH NODES: No lymphadenopathy. BONES AND SOFT TISSUES: No acute abnormality of the visualized bones. No focal soft tissue abnormality. IMPRESSION: 1. No pulmonary embolism. 2. Slightly increased small pericardial effusion from 10/18/24. 3. Irregular right upper lobe pulmonary nodule, unchanged at 2.3 x 1.1 cm. 4. No acute abnormality in the  abdomen or pelvis. Electronically signed by: Norman Gatlin MD 11/18/2024 03:39 AM EST RP Workstation: [id number]   DG Chest Portable 1 View Result Date: 11/18/2024 EXAM: 1 VIEW(S) XRAY OF THE CHEST 11/18/2024 01:31:54 AM COMPARISON: None available. CLINICAL HISTORY: eval for weakness FINDINGS: LINES, TUBES AND DEVICES: Left chest wall port-a-cath  tip in the mid SVC. LUNGS AND PLEURA: Left basilar atelectasis or infiltrates. No pleural effusion. No pneumothorax. HEART AND MEDIASTINUM: Stable cardiomediastinal silhouette. Aortic atherosclerotic calcification. BONES AND SOFT TISSUES: No acute osseous abnormality. IMPRESSION: 1. Left basilar atelectasis or infiltrates. Electronically signed by: Norman Gatlin MD 11/18/2024 01:35 AM EST RP Workstation: [id number]   CT HEAD WO CONTRAST Result Date: 11/18/2024 EXAM: CT HEAD WITHOUT CONTRAST 11/18/2024 01:26:34 AM TECHNIQUE: CT of the head was performed without the administration of intravenous contrast. Automated exposure control, iterative reconstruction, and/or weight based adjustment of the mA/kV was utilized to reduce the radiation dose to as low as reasonably achievable. COMPARISON: Comparison with 03/30/2018. CLINICAL HISTORY: Mental status change, unknown cause. FINDINGS: BRAIN AND VENTRICLES: No acute hemorrhage. No evidence of acute infarct. No hydrocephalus. No extra-axial collection.  No mass effect or midline shift. ORBITS: No acute abnormality. SINUSES: No acute abnormality. SOFT TISSUES AND SKULL: No acute soft tissue abnormality. No skull fracture. IMPRESSION: 1. No acute intracranial abnormality. Electronically signed by: Norman Gatlin MD 11/18/2024 01:34 AM EST RP Workstation: [id number]   ____________________________________________  PROCEDURES  Procedure(s) performed:   Procedures ____________________________________________  INITIAL IMPRESSION / ASSESSMENT AND PLAN   Clinical Course as of 11/18/24 0727  Mon Nov 18, 2024  0217 Initial Evaluation:  Patient presents with chest burning and a recent episode of syncope, along with hyperventilation and anxiety. Plan:  Cardiac evaluation, with EKG and cardiac enzymes CT scan may be considered due to recent lung cancer treatment to rule out PE or underlying infection Monitor closely with fluid resuscitation for low blood pressure Rectal Temperature Place patient on oxygen  Reassess pain management needs Focus on identifying the underlying cause of chest burning and near syncope [JM]  0458 BP(!): 85/52 [JM]  0458 BP(!): 81/53 Will give third liter but may need to start low dose pressors. Albumin  was slightly low so will replenish that too to see if it helps. Pending second LA [JM]  0645 BP(!): 83/57 Has had close to 3L of fluids, albumin  and still hypotensive. Will start levophed .  [JM]  0652 BP(!): 80/51 [JM]    Clinical Course User Index [JM] Kingsley Herandez, Selinda, MD     Images ordered viewed and obtained by myself. Agree with Radiology interpretation. Details in ED course.  Labs ordered reviewed by myself as detailed in ED course.  Consultations obtained/considered detailed in ED course.    CRITICAL INTERVENTIONS:  Levophed  Antibiotics after cultures Oxygen  support  Patient's blood pressures continue to trend downwards.  Ultimately persisting with maps below 65 so was started on Levophed .  He had 3500  cc of fluid in that time.  This was done gingerly secondary to his history of heart failure.  Will discuss with hospitalist for admission.   FINAL IMPRESSION Final diagnoses:  Hypotension, unspecified hypotension type  Sepsis, due to unspecified organism, unspecified whether acute organ dysfunction present [facility])     Disposition Medical screening exam was performed and I feel the patient has had appropriate emergency department evaluation and work-up for their chief complaint and is stable for ADMISSION to the hospital at this time.  Care transferred pending call back from the hospitalist team.  ____________________________________________  NEW OUTPATIENT MEDICATIONS STARTED DURING THIS VISIT:  New Prescriptions   No medications on file    Note:  This note was prepared with assistance of Dragon voice recognition software. Occasional wrong-word or sound-a-like substitutions may have occurred due to the inherent limitations of voice recognition software.    Claris Guymon, Selinda, MD 11/22/24 [phone]

## 2024-11-18 NOTE — Assessment & Plan Note (Signed)
-   No acute exacerbation appreciated - Continue bronchodilator management.

## 2024-11-18 NOTE — Assessment & Plan Note (Signed)
-   Update A1c - Sensitive sliding scale insulin  has been initiated - Follow CBG fluctuation and adjust medication as needed.. - Continue modified carbohydrate diet.

## 2024-11-18 NOTE — Plan of Care (Addendum)
 This patient remains on AP-ICCU as of time of writing. The patient is AA+Ox4. Supplemental O2 via [place] at 2 L /min. Upon change of shift, the patient is receiving levophed via PIV; weaning down as tolerated. This patient's admission profile was completed overnight by this RN.    Problem: Education: Goal: Knowledge of General Education information will improve Description: Including pain rating scale, medication(s)/side effects and non-pharmacologic comfort measures Outcome: Progressing   Problem: Health Behavior/Discharge Planning: Goal: Ability to manage health-related needs will improve Outcome: Progressing   Problem: Clinical Measurements: Goal: Ability to maintain clinical measurements within normal limits will improve Outcome: Progressing Goal: Will remain free from infection Outcome: Progressing Goal: Diagnostic test results will improve Outcome: Progressing Goal: Respiratory complications will improve Outcome: Progressing Goal: Cardiovascular complication will be avoided Outcome: Progressing   Problem: Activity: Goal: Risk for activity intolerance will decrease Outcome: Progressing   Problem: Nutrition: Goal: Adequate nutrition will be maintained Outcome: Progressing   Problem: Coping: Goal: Level of anxiety will decrease Outcome: Progressing   Problem: Elimination: Goal: Will not experience complications related to bowel motility Outcome: Progressing Goal: Will not experience complications related to urinary retention Outcome: Progressing   Problem: Pain Managment: Goal: General experience of comfort will improve and/or be controlled Outcome: Progressing   Problem: Safety: Goal: Ability to remain free from injury will improve Outcome: Progressing   Problem: Skin Integrity: Goal: Risk for impaired skin integrity will decrease Outcome: Progressing   Problem: Education: Goal: Ability to describe self-care measures that may prevent or decrease complications  (Diabetes Survival Skills Education) will improve Outcome: Progressing Goal: Individualized Educational Video(s) Outcome: Progressing   Problem: Coping: Goal: Ability to adjust to condition or change in health will improve Outcome: Progressing   Problem: Fluid Volume: Goal: Ability to maintain a balanced intake and output will improve Outcome: Progressing   Problem: Health Behavior/Discharge Planning: Goal: Ability to identify and utilize available resources and services will improve Outcome: Progressing Goal: Ability to manage health-related needs will improve Outcome: Progressing   Problem: Metabolic: Goal: Ability to maintain appropriate glucose levels will improve Outcome: Progressing   Problem: Nutritional: Goal: Maintenance of adequate nutrition will improve Outcome: Progressing Goal: Progress toward achieving an optimal weight will improve Outcome: Progressing   Problem: Skin Integrity: Goal: Risk for impaired skin integrity will decrease Outcome: Progressing   Problem: Tissue Perfusion: Goal: Adequacy of tissue perfusion will improve Outcome: Progressing   Problem: Fluid Volume: Goal: Hemodynamic stability will improve Outcome: Progressing   Problem: Clinical Measurements: Goal: Diagnostic test results will improve Outcome: Progressing Goal: Signs and symptoms of infection will decrease Outcome: Progressing   Problem: Respiratory: Goal: Ability to maintain adequate ventilation will improve Outcome: Progressing   Problem: Education: Goal: Ability to demonstrate management of disease process will improve Outcome: Progressing Goal: Ability to verbalize understanding of medication therapies will improve Outcome: Progressing Goal: Individualized Educational Video(s) Outcome: Progressing   Problem: Activity: Goal: Capacity to carry out activities will improve Outcome: Progressing   Problem: Cardiac: Goal: Ability to achieve and maintain adequate  cardiopulmonary perfusion will improve Outcome: Progressing   Problem: Education: Goal: Knowledge of the prescribed therapeutic regimen will improve Outcome: Progressing   Problem: Activity: Goal: Ability to implement measures to reduce episodes of fatigue will improve Outcome: Progressing   Problem: Bowel/Gastric: Goal: Will not experience complications related to bowel motility Outcome: Progressing   Problem: Coping: Goal: Ability to identify and develop effective coping behavior will improve Outcome: Progressing   Problem: Nutritional: Goal: Maintenance of  adequate nutrition will improve Outcome: Progressing   Problem: Education: Goal: Knowledge of disease or condition will improve Outcome: Progressing Goal: Knowledge of the prescribed therapeutic regimen will improve Outcome: Progressing Goal: Individualized Educational Video(s) Outcome: Progressing   Problem: Activity: Goal: Ability to tolerate increased activity will improve Outcome: Progressing Goal: Will verbalize the importance of balancing activity with adequate rest periods Outcome: Progressing   Problem: Respiratory: Goal: Ability to maintain a clear airway will improve Outcome: Progressing Goal: Levels of oxygenation will improve Outcome: Progressing Goal: Ability to maintain adequate ventilation will improve Outcome: Progressing

## 2024-11-19 ENCOUNTER — Other Ambulatory Visit (HOSPITAL_COMMUNITY): Payer: Self-pay

## 2024-11-19 DIAGNOSIS — A419 Sepsis, unspecified organism: Secondary | ICD-10-CM | POA: Diagnosis not present

## 2024-11-19 DIAGNOSIS — E118 Type 2 diabetes mellitus with unspecified complications: Secondary | ICD-10-CM | POA: Diagnosis not present

## 2024-11-19 DIAGNOSIS — I10 Essential (primary) hypertension: Secondary | ICD-10-CM | POA: Diagnosis not present

## 2024-11-19 DIAGNOSIS — E782 Mixed hyperlipidemia: Secondary | ICD-10-CM | POA: Diagnosis not present

## 2024-11-19 DIAGNOSIS — J42 Unspecified chronic bronchitis: Secondary | ICD-10-CM

## 2024-11-19 LAB — CBC
HCT: 30 % — ABNORMAL LOW (ref 39.0–52.0)
Hemoglobin: 9.7 g/dL — ABNORMAL LOW (ref 13.0–17.0)
MCH: 31.5 pg (ref 26.0–34.0)
MCHC: 32.3 g/dL (ref 30.0–36.0)
MCV: 97.4 fL (ref 80.0–100.0)
Platelets: 304 K/uL (ref 150–400)
RBC: 3.08 MIL/uL — ABNORMAL LOW (ref 4.22–5.81)
RDW: 14.8 % (ref 11.5–15.5)
WBC: 6.4 K/uL (ref 4.0–10.5)
nRBC: 0 % (ref 0.0–0.2)

## 2024-11-19 LAB — BASIC METABOLIC PANEL WITH GFR
Anion gap: 11 (ref 5–15)
BUN: 10 mg/dL (ref 8–23)
CO2: 27 mmol/L (ref 22–32)
Calcium: 8.7 mg/dL — ABNORMAL LOW (ref 8.9–10.3)
Chloride: 97 mmol/L — ABNORMAL LOW (ref 98–111)
Creatinine, Ser: 0.86 mg/dL (ref 0.61–1.24)
GFR, Estimated: >60 mL/min (ref >60)
Glucose, Bld: 94 mg/dL (ref 70–99)
Potassium: 3.5 mmol/L (ref 3.5–5.1)
Sodium: 135 mmol/L (ref 135–145)

## 2024-11-19 LAB — CORTISOL: Cortisol, Plasma: 10.1 ug/dL

## 2024-11-19 LAB — GLUCOSE, CAPILLARY
Glucose-Capillary: 105 mg/dL — ABNORMAL HIGH (ref 70–99)
Glucose-Capillary: 123 mg/dL — ABNORMAL HIGH (ref 70–99)
Glucose-Capillary: 150 mg/dL — ABNORMAL HIGH (ref 70–99)
Glucose-Capillary: 111 mg/dL — ABNORMAL HIGH (ref 70–99)

## 2024-11-19 LAB — MAGNESIUM: Magnesium: 1.8 mg/dL (ref 1.7–2.4)

## 2024-11-19 LAB — HEMOGLOBIN A1C
Hgb A1c MFr Bld: 6.6 % — ABNORMAL HIGH (ref 4.8–5.6)
Mean Plasma Glucose: 143 mg/dL

## 2024-11-19 MED ORDER — BUSPIRONE HCL 5 MG PO TABS
7.5000 mg | ORAL_TABLET | Freq: Two times a day (BID) | ORAL | Status: DC
  Administered 2024-11-19 – 2024-11-23 (×8): 7.5 mg via ORAL
  Filled 2024-11-19 (×2): qty 2
  Filled 2024-11-19: qty 1
  Filled 2024-11-19 (×5): qty 2

## 2024-11-19 MED ORDER — CHLORHEXIDINE GLUCONATE CLOTH 2 % EX PADS
6.0000 | MEDICATED_PAD | Freq: Every day | CUTANEOUS | Status: DC
  Administered 2024-11-19 – 2024-11-23 (×5): 6 via TOPICAL

## 2024-11-19 NOTE — TOC Initial Note (Signed)
 Transition of Care [facility]) - Initial/Assessment Note    Patient Details  Name: Juan Merritt MRN: [id number] Date of Birth: 1951/04/23  Transition of Care [facility]) CM/SW Contact:    Sharlyne Stabs, RN Phone Number: 11/19/2024, 10:53 AM  Clinical Narrative:      Patient admitted with septic shock, last admission on 10/16 assessed and considered to be a high risk for readmission. He lives with his wife. Independent in completing ADLs and drives to appointments when needed. He does not have HH and does not use any DME. IPCM following.         Barriers to Discharge: Continued Medical Work up   Patient Goals and CMS Choice Patient states their goals for this hospitalization and ongoing recovery are:: to get better CMS Medicare.gov Compare Post Acute Care list provided to:: Patient Choice offered to / list presented to : Patient [place] ownership interest in [facility]provided to:: Patient    Expected Discharge Plan and Services       Living arrangements for the past 2 months: Single Family Home                       Prior Living Arrangements/Services Living arrangements for the past 2 months: Single Family Home Lives with:: Spouse Patient language and need for interpreter reviewed:: Yes        Need for Family Participation in Patient Care: Yes (Comment) Care giver support system in place?: Yes (comment)   Criminal Activity/Legal Involvement Pertinent to Current Situation/Hospitalization: No - Comment as needed  Activities of Daily Living   ADL Screening (condition at time of admission) Independently performs ADLs?: Yes (appropriate for developmental age) Is the patient deaf or have difficulty hearing?: Yes Does the patient have difficulty seeing, even when wearing glasses/contacts?: No Does the patient have difficulty concentrating, remembering, or making decisions?: No  Permission Sought/Granted            Permission granted to share info w  Relationship: wife     Emotional Assessment     Affect (typically observed): Accepting Orientation: : Oriented to Self, Oriented to Place, Oriented to  Time, Oriented to Situation Alcohol / Substance Use: Not Applicable Psych Involvement: No (comment)  Admission diagnosis:  Septic shock (HCC) [A41.9, R65.21] Hypotension, unspecified hypotension type [I95.9] Sepsis, due to unspecified organism, unspecified whether acute organ dysfunction present [facility]) [A41.9] Patient Active Problem List   Diagnosis Date Noted   Septic shock (HCC) 11/18/2024   Chronic diastolic heart failure (HCC) 11/18/2024   History of lung cancer 11/18/2024   GERD (gastroesophageal reflux disease) 11/18/2024   Hypokalemia 11/18/2024   Type 2 diabetes mellitus with complication, without long-term current use of insulin  (HCC) 11/18/2024   BPH (benign prostatic hyperplasia) 11/18/2024   Other chest pain 11/04/2024   Sepsis due to pneumonia (HCC) 10/01/2024   Chronic alcohol abuse 10/01/2024   Odynophagia 10/01/2024   Hyponatremia 10/01/2024   FTT (failure to thrive) in adult 10/01/2024   SOB (shortness of breath) 09/17/2024   COPD with acute exacerbation (HCC) 09/17/2024   Encounter for smoking cessation counseling 09/17/2024   Tobacco dependence 09/17/2024   Type 2 diabetes mellitus with diabetic neuropathy, with long-term current use of insulin  (HCC) 08/07/2024   Transaminitis 04/11/2024   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   Fever 03/31/2024   Hypotension 03/31/2024   Dehydration 03/31/2024   Weakness generalized 03/31/2024   Generalized weakness 03/31/2024   Malignant neoplasm of upper lobe of right lung (HCC) 12/01/2023  Positive colorectal cancer screening using Cologuard test 10/01/2021   Nonspecific abnormal electrocardiogram (ECG) (EKG) 06/23/2020   Primary hypertension 05/05/2020   Mixed hyperlipidemia 05/05/2020   Hyperuricemia 05/05/2020   Morbid obesity due to excess calories (HCC) 09/23/2019    Gout, chronic 07/01/2016   Incomplete rotator cuff tear 09/07/2015   Essential hypertension, benign 09/07/2015   COPD (chronic obstructive pulmonary disease) (HCC) 03/29/2015   Cigarette smoker 03/24/2015   PCP:  Zollie Lowers, MD Pharmacy:   [facility] [phone] - [place], [place] - [address] [place] [address] Phone: [phone] Fax: [phone]     Social Drivers of Health (SDOH) Social History: SDOH Screenings   Food Insecurity: No Food Insecurity (11/18/2024)  Housing: Low Risk  (11/18/2024)  Transportation Needs: No Transportation Needs (11/18/2024)  Utilities: Not At Risk (11/18/2024)  Alcohol Screen: Low Risk  (08/22/2024)  Depression (PHQ2-9): Medium Risk (11/12/2024)  Financial Resource Strain: Low Risk  (08/22/2024)  Physical Activity: Inactive (08/22/2024)  Social Connections: Moderately Isolated (11/18/2024)  Stress: No Stress Concern Present (08/22/2024)  Tobacco Use: High Risk (11/18/2024)  Health Literacy: Adequate Health Literacy (08/22/2024)   SDOH Interventions:     Readmission Risk Interventions    11/19/2024   10:52 AM 10/03/2024    1:57 PM  Readmission Risk Prevention Plan  Transportation Screening Complete Complete  PCP or Specialist Appt within 3-5 Days Not Complete   HRI or Home Care Consult Complete Complete  Social Work Consult for Recovery Care Planning/Counseling Complete Complete  Palliative Care Screening Not Applicable Not Applicable  Medication Review Oceanographer) Complete Complete

## 2024-11-19 NOTE — Assessment & Plan Note (Signed)
-   Presumed septic shock at time of admission; most likely driven by bronchitis. -No longer requiring pressor support - Continue current antibiotics while following culture results - Continue to maintain adequate hydration and follow clinical response. -Patient will be transfer to MedSurg bed.

## 2024-11-19 NOTE — Plan of Care (Signed)
 This patient remains on AP-ICCU as of time of writing but has now been downgraded to med-surg level of care ;awaiting bed for transfer.    Problem: Education: Goal: Knowledge of General Education information will improve Description: Including pain rating scale, medication(s)/side effects and non-pharmacologic comfort measures Outcome: Progressing   Problem: Health Behavior/Discharge Planning: Goal: Ability to manage health-related needs will improve Outcome: Progressing   Problem: Clinical Measurements: Goal: Ability to maintain clinical measurements within normal limits will improve Outcome: Progressing Goal: Will remain free from infection Outcome: Progressing Goal: Diagnostic test results will improve Outcome: Progressing Goal: Respiratory complications will improve Outcome: Progressing Goal: Cardiovascular complication will be avoided Outcome: Progressing   Problem: Activity: Goal: Risk for activity intolerance will decrease Outcome: Progressing   Problem: Nutrition: Goal: Adequate nutrition will be maintained Outcome: Progressing   Problem: Coping: Goal: Level of anxiety will decrease Outcome: Progressing   Problem: Elimination: Goal: Will not experience complications related to bowel motility Outcome: Progressing Goal: Will not experience complications related to urinary retention Outcome: Progressing   Problem: Pain Managment: Goal: General experience of comfort will improve and/or be controlled Outcome: Progressing   Problem: Safety: Goal: Ability to remain free from injury will improve Outcome: Progressing   Problem: Skin Integrity: Goal: Risk for impaired skin integrity will decrease Outcome: Progressing   Problem: Education: Goal: Ability to describe self-care measures that may prevent or decrease complications (Diabetes Survival Skills Education) will improve Outcome: Progressing Goal: Individualized Educational Video(s) Outcome: Progressing    Problem: Coping: Goal: Ability to adjust to condition or change in health will improve Outcome: Progressing   Problem: Fluid Volume: Goal: Ability to maintain a balanced intake and output will improve Outcome: Progressing   Problem: Health Behavior/Discharge Planning: Goal: Ability to identify and utilize available resources and services will improve Outcome: Progressing Goal: Ability to manage health-related needs will improve Outcome: Progressing   Problem: Metabolic: Goal: Ability to maintain appropriate glucose levels will improve Outcome: Progressing   Problem: Nutritional: Goal: Maintenance of adequate nutrition will improve Outcome: Progressing Goal: Progress toward achieving an optimal weight will improve Outcome: Progressing   Problem: Skin Integrity: Goal: Risk for impaired skin integrity will decrease Outcome: Progressing   Problem: Tissue Perfusion: Goal: Adequacy of tissue perfusion will improve Outcome: Progressing   Problem: Fluid Volume: Goal: Hemodynamic stability will improve Outcome: Progressing   Problem: Clinical Measurements: Goal: Diagnostic test results will improve Outcome: Progressing Goal: Signs and symptoms of infection will decrease Outcome: Progressing   Problem: Respiratory: Goal: Ability to maintain adequate ventilation will improve Outcome: Progressing   Problem: Education: Goal: Ability to demonstrate management of disease process will improve Outcome: Progressing Goal: Ability to verbalize understanding of medication therapies will improve Outcome: Progressing Goal: Individualized Educational Video(s) Outcome: Progressing   Problem: Activity: Goal: Capacity to carry out activities will improve Outcome: Progressing   Problem: Cardiac: Goal: Ability to achieve and maintain adequate cardiopulmonary perfusion will improve Outcome: Progressing   Problem: Education: Goal: Knowledge of the prescribed therapeutic regimen will  improve Outcome: Progressing   Problem: Activity: Goal: Ability to implement measures to reduce episodes of fatigue will improve Outcome: Progressing   Problem: Bowel/Gastric: Goal: Will not experience complications related to bowel motility Outcome: Progressing   Problem: Coping: Goal: Ability to identify and develop effective coping behavior will improve Outcome: Progressing   Problem: Nutritional: Goal: Maintenance of adequate nutrition will improve Outcome: Progressing   Problem: Education: Goal: Knowledge of disease or condition will improve Outcome: Progressing Goal: Knowledge of  the prescribed therapeutic regimen will improve Outcome: Progressing Goal: Individualized Educational Video(s) Outcome: Progressing   Problem: Activity: Goal: Ability to tolerate increased activity will improve Outcome: Progressing Goal: Will verbalize the importance of balancing activity with adequate rest periods Outcome: Progressing   Problem: Respiratory: Goal: Ability to maintain a clear airway will improve Outcome: Progressing Goal: Levels of oxygenation will improve Outcome: Progressing Goal: Ability to maintain adequate ventilation will improve Outcome: Progressing

## 2024-11-19 NOTE — Assessment & Plan Note (Signed)
-   Electrolytes have been repleted and currently stable - Continue to follow trend/stability.

## 2024-11-19 NOTE — Assessment & Plan Note (Signed)
-   Stable and compensated - Continue to follow daily weights/strict I's and O's - Continue heart healthy/low-sodium diet.

## 2024-11-19 NOTE — Assessment & Plan Note (Signed)
-   Stable vital signs appreciated - Will continue to hold home antihypertensive agents at the moment. - Cortisol level demonstrating no presence of adrenal insufficiency. -Planning to have patient's home diuretic therapy adjusted. -TED hoses/compression stocking discussed with patient and significant other.

## 2024-11-19 NOTE — Assessment & Plan Note (Signed)
-   No wheezing appreciated on exam - Continue bronchodilator management.

## 2024-11-19 NOTE — Assessment & Plan Note (Signed)
-   Status post radiation and chemotherapy - Actively receiving immunotherapy - Continue patient follow-up with oncology service.

## 2024-11-19 NOTE — Assessment & Plan Note (Signed)
-   Follow CBG fluctuation and adjust hypoglycemic regimen as needed - Continue sliding scale insulin . -A1c 6.6.

## 2024-11-19 NOTE — Assessment & Plan Note (Signed)
-   No complaints of urinary retention currently reported - Planning to resume Flomax  on 11/20/2024.

## 2024-11-19 NOTE — Assessment & Plan Note (Signed)
-   Continue PPI. 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

## 2024-11-19 NOTE — Progress Notes (Signed)
 Progress Note   Patient: Juan Merritt FMW:[id number] DOB: Mar 23, 1951 DOA: 11/18/2024     1 DOS: the patient was seen and examined on 11/19/2024   Brief hospital admission narrative: Juan Merritt is a 73 y.o. male with medical history significant of chronic diastolic heart failure, history of lung cancer (status post radiation/chemotherapy and receiving immunotherapy), history of blood clots, COPD, BPH, hyperlipidemia, type 2 diabetes mellitus and GERD; who presented to the hospital secondary to burning chest discomfort.  Patient also expressed having some intermittent weakness/confusion and chills.  Patient denies sick contacts, focal weaknesses, nausea/vomiting, dysuria, hematuria, melena, hematochezia, or any other complaints.    Workup in the ED demonstrated patient with soft blood pressure, febrile with a Tmax of 101.4 and the presence of hypertension.  Patient lactic acid was also elevated.  CT chest with concern for bronchitic changes, negative for pulmonary embolism findings and frank infiltrates.  CT abdomen and pelvis unremarkable.  Given history of cancer/immunosuppression broad-spectrum antibiotics started after cultures taken.  Patient received 3.5 L of fluid resuscitation without improvement in his blood pressure and in the requiring pressors initiation (peripherally).     TR[facility] has been contacted to place patient in the hospital for further evaluation and management.  Assessment and plan  Chronic diastolic heart failure (HCC) - Stable and compensated - In the setting of presumed septic shock presentation holding diuretics and patient has received fluid resuscitation - Follow daily weights and strict intake and output - Heart healthy/low-sodium diet discussed with patient.  Mixed hyperlipidemia - Heart healthy diet discussed with patient - Patient is currently not using statin.  Primary hypertension - Holding antihypertensive agents as patient presented with concern for  septic shock and is requiring pressors. - Follow vital signs.  COPD (chronic obstructive pulmonary disease) (HCC) - No acute exacerbation appreciated - Continue bronchodilator management.  Septic shock (HCC) - Presumed septic shock at time of admission - Patient with elevated lactic acid, failure to respond on fluid resuscitation requiring pressors; he was slightly tachycardic and with positive fever. - Images suggesting the presence of most likely bronchitis. - Rest Bactrim antibiotic has been initiated - Follow culture results - Follow clinical response - Continue supportive care.  BPH (benign prostatic hyperplasia) - Complaints of urinary retention currently reported - Will resume the use of Flomax  once blood pressure further stabilized.  Type 2 diabetes mellitus with complication, without long-term current use of insulin  (HCC) - Update A1c - Sensitive sliding scale insulin  has been initiated - Follow CBG fluctuation and adjust medication as needed.. - Continue modified carbohydrate diet.  Hypokalemia - Replete potassium and check magnesium  level - Telemetry monitoring in place.  GERD (gastroesophageal reflux disease) - Continue PPI.  History of lung cancer - Status postradiation and chemotherapy - Actively receiving immunotherapy until recently - Continue patient follow-up with oncology service.  Septic shock (HCC) - Presumed septic shock at time of admission; most likely driven by bronchitis. -No longer requiring pressor support - Continue current antibiotics while following culture results - Continue to maintain adequate hydration and follow clinical response. -Patient will be transfer to MedSurg bed.  COPD (chronic obstructive pulmonary disease) (HCC) - No wheezing appreciated on exam - Continue bronchodilator management.  Primary hypertension - Stable vital signs appreciated - Will continue to hold home antihypertensive agents at the moment. - Cortisol  level demonstrating no presence of adrenal insufficiency. -Planning to have patient's home diuretic therapy adjusted. -TED hoses/compression stocking discussed with patient and significant other.  Mixed hyperlipidemia - Continue heart healthy/low-fat diet. - No longer using a statin.  Chronic diastolic heart failure (HCC) - Stable and compensated - Continue to follow daily weights/strict I's and O's - Continue heart healthy/low-sodium diet.  History of lung cancer - Status post radiation and chemotherapy - Actively receiving immunotherapy - Continue patient follow-up with oncology service.  GERD (gastroesophageal reflux disease) - Continue PPI. 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  Hypokalemia - Electrolytes have been repleted and currently stable - Continue to follow trend/stability.  Type 2 diabetes mellitus with complication, without long-term current use of insulin  (HCC) - Follow CBG fluctuation and adjust hypoglycemic regimen as needed - Continue sliding scale insulin . -A1c 6.6.  BPH (benign prostatic hyperplasia) - No complaints of urinary retention currently reported - Planning to resume Flomax  on 11/20/2024.  Class 1 obesity -Body mass index is 32.7 kg/m. -low calorie diet and portion controlled discussed with patient  Subjective:  No fever, no nausea vomiting.  No shortness of breath or expiratory wheezing appreciated on exam.  Throughout the night patient received oxygen  supplementation as he expressed having some ongoing burning sensation in his chest and also feeling slightly short of breath.  Physical Exam: Vitals:   11/19/24 1342 11/19/24 1400 11/19/24 1600 11/19/24 1653  BP:  109/68 110/86   Pulse:  98 (!) 105   Resp:   20   Temp: 98.6 F (37 C)   98.4 F (36.9 C)  TempSrc: Oral   Oral  SpO2:  96% 93%   Weight:      Height:       General exam: Alert, awake,  oriented x 3; in no major distress. Respiratory system: Good air movement bilaterally; no using accessory muscles.  No expiratory wheezing appreciated on exam. Cardiovascular system: Rate controlled, no rubs, no gallops, no JVD. Gastrointestinal system: Abdomen is nondistended, soft and nontender.  Positive bowel sounds. Central nervous system: Open 4 limbs spontaneously.  No focal neurological deficits. Extremities: No cyanosis or clubbing; trace edema appreciated bilaterally. Skin: No petechiae. Psychiatry: Judgement and insight appear normal. Mood & affect appropriate.    Data Reviewed: Cortisol: 10.1 Base metabolic panel: Sodium 135, potassium 3.5, chloride 97, bicarb 27, BUN 10, creatinine 0.86 and GFR >60 CBC:WBC 6.4, hemoglobin 9.7 and platelet count 304K Magnesium : 1.8  Family Communication: Significant other updated at bedside.  Disposition: Status is: Inpatient Remains inpatient appropriate because: Continue IV antibiotics.   Time spent: 50 minutes  Author: Eric Nunnery, MD 11/19/2024 5:23 PM  For on call review [email or web address].

## 2024-11-19 NOTE — Assessment & Plan Note (Signed)
-   Continue heart healthy/low-fat diet. - No longer using a statin.

## 2024-11-20 DIAGNOSIS — I10 Essential (primary) hypertension: Secondary | ICD-10-CM | POA: Diagnosis not present

## 2024-11-20 DIAGNOSIS — E876 Hypokalemia: Secondary | ICD-10-CM | POA: Diagnosis not present

## 2024-11-20 DIAGNOSIS — A419 Sepsis, unspecified organism: Secondary | ICD-10-CM | POA: Diagnosis not present

## 2024-11-20 DIAGNOSIS — I471 Supraventricular tachycardia, unspecified: Secondary | ICD-10-CM

## 2024-11-20 DIAGNOSIS — E782 Mixed hyperlipidemia: Secondary | ICD-10-CM | POA: Diagnosis not present

## 2024-11-20 LAB — BASIC METABOLIC PANEL WITH GFR
Anion gap: 9 (ref 5–15)
BUN: 11 mg/dL (ref 8–23)
CO2: 28 mmol/L (ref 22–32)
Calcium: 8.8 mg/dL — ABNORMAL LOW (ref 8.9–10.3)
Chloride: 101 mmol/L (ref 98–111)
Creatinine, Ser: 0.78 mg/dL (ref 0.61–1.24)
GFR, Estimated: >60 mL/min (ref >60)
Glucose, Bld: 95 mg/dL (ref 70–99)
Potassium: 3.5 mmol/L (ref 3.5–5.1)
Sodium: 138 mmol/L (ref 135–145)

## 2024-11-20 LAB — CBC
HCT: 29.8 % — ABNORMAL LOW (ref 39.0–52.0)
Hemoglobin: 9.4 g/dL — ABNORMAL LOW (ref 13.0–17.0)
MCH: 30.8 pg (ref 26.0–34.0)
MCHC: 31.5 g/dL (ref 30.0–36.0)
MCV: 97.7 fL (ref 80.0–100.0)
Platelets: 307 K/uL (ref 150–400)
RBC: 3.05 MIL/uL — ABNORMAL LOW (ref 4.22–5.81)
RDW: 14.8 % (ref 11.5–15.5)
WBC: 6.4 K/uL (ref 4.0–10.5)
nRBC: 0 % (ref 0.0–0.2)

## 2024-11-20 LAB — GLUCOSE, CAPILLARY
Glucose-Capillary: 107 mg/dL — ABNORMAL HIGH (ref 70–99)
Glucose-Capillary: 104 mg/dL — ABNORMAL HIGH (ref 70–99)
Glucose-Capillary: 134 mg/dL — ABNORMAL HIGH (ref 70–99)
Glucose-Capillary: 114 mg/dL — ABNORMAL HIGH (ref 70–99)
Glucose-Capillary: 155 mg/dL — ABNORMAL HIGH (ref 70–99)

## 2024-11-20 MED ORDER — MIDODRINE HCL 5 MG PO TABS
5.0000 mg | ORAL_TABLET | Freq: Two times a day (BID) | ORAL | Status: DC
  Administered 2024-11-20 (×2): 5 mg via ORAL
  Filled 2024-11-20 (×2): qty 1

## 2024-11-20 MED ORDER — TORSEMIDE 20 MG PO TABS
40.0000 mg | ORAL_TABLET | Freq: Every day | ORAL | Status: DC
  Administered 2024-11-20 – 2024-11-23 (×4): 40 mg via ORAL
  Filled 2024-11-20 (×4): qty 2

## 2024-11-20 MED ORDER — LACTATED RINGERS IV BOLUS: 150.0000 mL | Freq: Once | INTRAVENOUS | Status: DC

## 2024-11-20 MED ORDER — INSULIN ASPART 100 UNIT/ML IJ SOLN
INTRAMUSCULAR | Status: AC
  Filled 2024-11-20: qty 1

## 2024-11-20 MED ORDER — METOPROLOL TARTRATE 5 MG/5ML IV SOLN
2.5000 mg | Freq: Three times a day (TID) | INTRAVENOUS | Status: DC
  Administered 2024-11-20 – 2024-11-21 (×3): 2.5 mg via INTRAVENOUS
  Filled 2024-11-20 (×4): qty 5

## 2024-11-20 MED ORDER — TAMSULOSIN HCL 0.4 MG PO CAPS
0.4000 mg | ORAL_CAPSULE | Freq: Every day | ORAL | Status: DC
  Administered 2024-11-20 – 2024-11-22 (×3): 0.4 mg via ORAL
  Filled 2024-11-20 (×3): qty 1

## 2024-11-20 MED ORDER — AMOXICILLIN-POT CLAVULANATE 875-125 MG PO TABS
1.0000 | ORAL_TABLET | Freq: Two times a day (BID) | ORAL | Status: DC
  Administered 2024-11-20 – 2024-11-23 (×7): 1 via ORAL
  Filled 2024-11-20 (×7): qty 1

## 2024-11-20 MED ORDER — POTASSIUM CHLORIDE CRYS ER 20 MEQ PO TBCR
20.0000 meq | EXTENDED_RELEASE_TABLET | Freq: Two times a day (BID) | ORAL | Status: DC
  Administered 2024-11-20 – 2024-11-21 (×4): 20 meq via ORAL
  Filled 2024-11-20: qty 2
  Filled 2024-11-20 (×3): qty 1

## 2024-11-20 MED ORDER — MIDODRINE HCL 5 MG PO TABS
10.0000 mg | ORAL_TABLET | Freq: Once | ORAL | Status: AC
  Administered 2024-11-20: 10 mg via ORAL
  Filled 2024-11-20: qty 2

## 2024-11-20 MED ORDER — ADENOSINE 6 MG/2ML IV SOLN
INTRAVENOUS | Status: AC
  Filled 2024-11-20: qty 2

## 2024-11-20 MED ORDER — MIDODRINE HCL 5 MG PO TABS
5.0000 mg | ORAL_TABLET | Freq: Three times a day (TID) | ORAL | Status: DC
  Administered 2024-11-21 (×3): 5 mg via ORAL
  Filled 2024-11-20 (×3): qty 1

## 2024-11-20 NOTE — Progress Notes (Signed)
 Mobility Specialist Progress Note:    11/20/24 0955  Mobility  Activity Ambulated with assistance  Level of Assistance Modified independent, requires aide device or extra time  Assistive Device Other (Comment) (IV pole)  Distance Ambulated (ft) 22 ft  Range of Motion/Exercises Active;All extremities  Activity Response Tolerated well  Mobility Referral Yes  Mobility visit 1 Mobility  Mobility Specialist Start Time (ACUTE ONLY) 0955  Mobility Specialist Stop Time (ACUTE ONLY) 1007  Mobility Specialist Time Calculation (min) (ACUTE ONLY) 12 min   Pt received standing EOB, bed alarm going off. ModI to stand and ambulate using IV pole for support. Ambulated to bathroom and left in chair, all needs met.  Tianah Lonardo Mobility Specialist Please contact via Special Educational Needs Teacher or  Rehab office at [phone]

## 2024-11-20 NOTE — Progress Notes (Signed)
 Progress Note   Patient: Juan Merritt FMW:[id number] DOB: [birth year] DOA: 11/18/2024     2 DOS: the patient was seen and examined on 11/20/2024   Brief hospital admission narrative: Juan Merritt is a 73 y.o. male with medical history significant of chronic diastolic heart failure, history of lung cancer (status post radiation/chemotherapy and receiving immunotherapy), history of blood clots, COPD, BPH, hyperlipidemia, type 2 diabetes mellitus and GERD; who presented to the hospital secondary to burning chest discomfort.  Patient also expressed having some intermittent weakness/confusion and chills.  Patient denies sick contacts, focal weaknesses, nausea/vomiting, dysuria, hematuria, melena, hematochezia, or any other complaints.    Workup in the ED demonstrated patient with soft blood pressure, febrile with a Tmax of 101.4 and the presence of hypertension.  Patient lactic acid was also elevated.  CT chest with concern for bronchitic changes, negative for pulmonary embolism findings and frank infiltrates.  CT abdomen and pelvis unremarkable.  Given history of cancer/immunosuppression broad-spectrum antibiotics started after cultures taken.  Patient received 3.5 L of fluid resuscitation without improvement in his blood pressure and in the requiring pressors initiation (peripherally).     [facility] has been contacted to place patient in the hospital for further evaluation and management.  Assessment and plan  Chronic diastolic heart failure (HCC) - Stable and compensated - Initial septic shock has now resolved; patient's diuretics has been resumed. - Follow daily weights and strict intake and output - Heart healthy/low-sodium diet discussed with patient.  Symptomatic SVT - Status post 18 mg of adenosine  prior to abort symptomatic elevated heart rate - Lopressor  low-dose has been added - Patient transfer to stepdown for close monitoring - Will check 2D echo and discussed case with cardiology  service. 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  Primary hypertension - Demadex  and home dose of Flomax  has been resumed - In order to minimize the chances of orthostasis midodrine  has been started. - Continue to follow vital signs. - As mentioned above Lopressor  has also been added to his regimen. - Cortisol level demonstrating no presence of adrenal insufficiency. -Planning to have patient's home diuretic therapy adjusted. -TED hoses/compression stocking discussed with patient and significant other.   - midodrine  has also been started to minimize orthostatic component.  COPD (chronic obstructive pulmonary disease) (HCC) - No acute exacerbation appreciated - Continue bronchodilator management.  BPH (benign prostatic hyperplasia) - No Complaints of urinary retention currently reported - Flomax  has been resumed.  Type 2 diabetes mellitus with complication, without long-term current use of insulin  (HCC) - A1c 6.6 - Sensitive sliding scale insulin  has been initiated - Follow CBG fluctuation and adjust medication as needed.. - Continue modified carbohydrate diet.  Hypokalemia - Electrolytes have been repleted - Will follow trend and further replete as needed - Daily supplementation since diuretic has been resumed, restarted.  History of lung cancer - Status postradiation and chemotherapy - Actively receiving immunotherapy until recently - Continue patient follow-up with oncology service.  Septic shock (HCC) - Presumed septic shock at time of admission; most likely driven by bronchitis. -No longer requiring pressor support - Antibiotic has been transition to oral Augmentin .  Mixed hyperlipidemia - Continue heart healthy/low-fat diet. - No longer using a statin.  GERD (gastroesophageal reflux disease) - Continue PPI. 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  Class 1  obesity -Body mass index is 32.83 kg/m. -low calorie diet and portion controlled discussed  with patient  Subjective:  No fever, no chest pain, no nausea vomiting.  Patient with episode of symptomatic SVT that require up to 18 mg of adenosine in order to abort symptoms and control heart rate.  Patient denies any other complaints.  Physical Exam: Vitals:   11/20/24 0853 11/20/24 1327 11/20/24 1801 11/20/24 1815  BP: 102/73 107/69 99/69 100/72  Pulse: (!) 110 (!) 108  (!) 122  Resp: 18 18  (!) 26  Temp: 98.5 F (36.9 C) 98.5 F (36.9 C)    TempSrc:      SpO2: 100% 99% 94% 96%  Weight:      Height:       General exam: Alert, awake, oriented x 3; afebrile.  Reporting no chest pain. Respiratory system: 2-3 L nasal cannula supplementation in place.  No wheezing or crackles appreciated on exam. Cardiovascular system: Sinus tachycardia, no rubs, no gallops, no JVD. Gastrointestinal system: Abdomen is obese, nondistended, soft and nontender. No organomegaly or masses felt. Normal bowel sounds heard. Central nervous system: Moving 4 limbs spontaneously.  No focal neurological deficits. Extremities: No cyanosis or clubbing.  1+ pedal edema appreciated bilaterally. Skin: No petechiae. Psychiatry: Judgement and insight appear normal. Mood & affect appropriate.   Data Reviewed: Cortisol: 10.1 Base metabolic panel: Sodium 135, potassium 3.5, chloride 97, bicarb 27, BUN 10, creatinine 0.86 and GFR >60 CBC:WBC 6.4, hemoglobin 9.7 and platelet count 304K Magnesium : 1.8  Family Communication: Significant other updated at bedside.  Disposition: Status is: Inpatient Remains inpatient appropriate because: Continue IV therapy, pending 2D echo and further discussion/evaluation with cardiology service.(Patient with symptomatic SVT).   CRITICAL CARE Performed by: Eric Nunnery   Total critical care time: 55 minutes  Critical care time was exclusive of separately billable procedures and  treating other patients.  Critical care was necessary to treat or prevent imminent or life-threatening deterioration.  Critical care was time spent personally by me on the following activities: development of treatment plan with patient and/or surrogate as well as nursing, discussions with consultants, evaluation of patient's response to treatment, examination of patient, obtaining history from patient or surrogate, ordering and performing treatments and interventions, ordering and review of laboratory studies, ordering and review of radiographic studies, pulse oximetry and re-evaluation of patient's condition.   Author: Eric Nunnery, MD 11/20/2024 6:28 PM  For on call review [email or web address].

## 2024-11-21 ENCOUNTER — Inpatient Hospital Stay (HOSPITAL_COMMUNITY)

## 2024-11-21 ENCOUNTER — Other Ambulatory Visit (HOSPITAL_COMMUNITY): Payer: Self-pay | Admitting: *Deleted

## 2024-11-21 DIAGNOSIS — E782 Mixed hyperlipidemia: Secondary | ICD-10-CM | POA: Diagnosis not present

## 2024-11-21 DIAGNOSIS — E785 Hyperlipidemia, unspecified: Secondary | ICD-10-CM

## 2024-11-21 DIAGNOSIS — I2510 Atherosclerotic heart disease of native coronary artery without angina pectoris: Secondary | ICD-10-CM

## 2024-11-21 DIAGNOSIS — R008 Other abnormalities of heart beat: Secondary | ICD-10-CM

## 2024-11-21 DIAGNOSIS — I10 Essential (primary) hypertension: Secondary | ICD-10-CM | POA: Diagnosis not present

## 2024-11-21 DIAGNOSIS — A419 Sepsis, unspecified organism: Secondary | ICD-10-CM | POA: Diagnosis not present

## 2024-11-21 DIAGNOSIS — E118 Type 2 diabetes mellitus with unspecified complications: Secondary | ICD-10-CM | POA: Diagnosis not present

## 2024-11-21 DIAGNOSIS — I471 Supraventricular tachycardia, unspecified: Secondary | ICD-10-CM | POA: Diagnosis not present

## 2024-11-21 LAB — GLUCOSE, CAPILLARY
Glucose-Capillary: 106 mg/dL — ABNORMAL HIGH (ref 70–99)
Glucose-Capillary: 131 mg/dL — ABNORMAL HIGH (ref 70–99)
Glucose-Capillary: 154 mg/dL — ABNORMAL HIGH (ref 70–99)
Glucose-Capillary: 111 mg/dL — ABNORMAL HIGH (ref 70–99)

## 2024-11-21 LAB — ECHOCARDIOGRAM COMPLETE
AR max vel: 3.5 cm2
AV Area VTI: 3.52 cm2
AV Area mean vel: 3.43 cm2
AV Mean grad: 3 mmHg
AV Peak grad: 6.5 mmHg
Ao pk vel: 1.27 m/s
Area-P 1/2: 3.48 cm2
Height: 70.5 in
S' Lateral: 3 cm
Weight: 3753.11 [oz_av]

## 2024-11-21 NOTE — Progress Notes (Signed)
 Progress Note   Patient: Juan Merritt FMW:[id number] DOB: 06-Nov-1951 DOA: 11/18/2024     3 DOS: the patient was seen and examined on 11/21/2024   Brief hospital admission narrative: JAYLENN ALTIER is a 73 y.o. male with medical history significant of chronic diastolic heart failure, history of lung cancer (status post radiation/chemotherapy and receiving immunotherapy), history of blood clots, COPD, BPH, hyperlipidemia, type 2 diabetes mellitus and GERD; who presented to the hospital secondary to burning chest discomfort.  Patient also expressed having some intermittent weakness/confusion and chills.  Patient denies sick contacts, focal weaknesses, nausea/vomiting, dysuria, hematuria, melena, hematochezia, or any other complaints.    Workup in the ED demonstrated patient with soft blood pressure, febrile with a Tmax of 101.4 and the presence of hypertension.  Patient lactic acid was also elevated.  CT chest with concern for bronchitic changes, negative for pulmonary embolism findings and frank infiltrates.  CT abdomen and pelvis unremarkable.  Given history of cancer/immunosuppression broad-spectrum antibiotics started after cultures taken.  Patient received 3.5 L of fluid resuscitation without improvement in his blood pressure and in the requiring pressors initiation (peripherally).     [facility] has been contacted to place patient in the hospital for further evaluation and management.  Assessment and plan  Chronic diastolic heart failure (HCC) - Stable and compensated - Initial septic shock has now resolved; patient's diuretics has been resumed. - Follow daily weights and strict intake and output - Heart healthy/low-sodium diet discussed with patient. - No crackles appreciated on exam.  Symptomatic SVT - Status post 18 mg of adenosine prior to abort symptomatic elevated heart rate - Lopressor low-dose has been added - Patient transfer to stepdown for close monitoring - Will check 2D echo  and discussed case with cardiology service. 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  Primary hypertension - Demadex  and home dose of Flomax  has been resumed - In order to minimize the chances of orthostasis midodrine has been started. - Continue to follow vital signs. - Continue current dose of Lopressor. - Cortisol level demonstrating no presence of adrenal insufficiency.  Per cardiology service recommendation will repeat cortisol level in AM. -Planning to have patient's home diuretic therapy adjusted. - Continue the use of TED hoses/compression stocking as discussed with patient and significant other.   - Continue the use of midodrine to minimize any component of orthostatic changes; will taper off as tolerated. - Follow-up 2D echo results and any further cardiology service recommendations.  COPD (chronic obstructive pulmonary disease) (HCC) - No acute exacerbation appreciated - Continue bronchodilator management.  BPH (benign prostatic hyperplasia) - No Complaints of urinary retention currently reported - Continue Flomax .  Type 2 diabetes mellitus with complication, without long-term current use of insulin  (HCC) - A1c 6.6 - Sensitive sliding scale insulin  has been initiated - Follow CBG fluctuation and adjust medication as needed.. - Continue modified carbohydrate diet.  Hypokalemia - Electrolytes have been repleted - Will follow trend and further replete as needed - Continue daily potassium supplementation.  History of lung cancer - Status postradiation and chemotherapy - Actively receiving immunotherapy until recently - Continue patient follow-up with oncology service.  Septic shock (HCC) - Presumed septic shock at time of admission; most likely driven by bronchitis. -No longer requiring pressor support - Antibiotic has been transitioned to oral Augmentin . - Continue supportive care.  Mixed hyperlipidemia - Continue heart healthy/low-fat diet. -  No longer using a statin.  GERD (gastroesophageal reflux disease) - Continue PPI. -There  is ongoing concerns for neuropathy following radiation therapy; will continue the use of Neurontin twice a day.  Class 1 obesity -Body mass index is 33.18 kg/m. -low calorie diet and portion controlled discussed with patient  Subjective:  Patient heart rate has remained controlled; patient denies chest pain and palpitations.  No fever, no nausea, no vomiting.  Physical Exam: Vitals:   11/21/24 1300 11/21/24 1400 11/21/24 1448 11/21/24 1500  BP: 131/71 101/73  128/80  Pulse: (!) 102 (!) 102  (!) 105  Resp: (!) 33 (!) 25  (!) 25  Temp:      TempSrc:      SpO2: 98% 96% 93% 95%  Weight:      Height:       General exam: Alert, awake, oriented x 3; denies chest pain, no nausea, no vomiting, no palpitations.  Overall feeling better. Respiratory system: No wheeze or crackles appreciated on exam; good air movement bilaterally.  1-2 L supplementation in place. Cardiovascular system: Rate control with some intermittent sinus tachycardia; no rubs, no gallops, no JVD. Gastrointestinal system: Abdomen is obese, nondistended, soft and nontender. No organomegaly or masses felt. Normal bowel sounds heard. Central nervous system: No focal neurological deficits. Extremities: No cyanosis or clubbing.  Trace pedal edema appreciated bilaterally. Skin: No petechiae. Psychiatry: Judgement and insight appear normal. Mood & affect appropriate.   Latest data Reviewed: Cortisol: 10.1 Base metabolic panel: Sodium 135, potassium 3.5, chloride 97, bicarb 27, BUN 10, creatinine 0.86 and GFR >60 CBC:WBC 6.4, hemoglobin 9.7 and platelet count 304K Magnesium : 1.8  Family Communication: Significant other updated at bedside.  Disposition: Status is: Inpatient Remains inpatient appropriate because: Continue IV therapy, pending 2D echo and further discussion/evaluation with cardiology service.(Patient with symptomatic  SVT).   Time: 50 minutes  Author: Eric Nunnery, MD 11/21/2024 3:56 PM  For on call review [email or web address].

## 2024-11-21 NOTE — Consult Note (Signed)
 Cardiology Consultation   Patient ID: Juan Merritt MRN: [id number]; DOB: [birth year]  Admit date: 11/18/2024 Date of Consult: 11/21/2024  PCP:  Juan Lowers, Merritt   [facility] Providers Cardiologist:  New     Patient Profile: Juan Merritt is a 73 y.o. male with a hx of HFpEF who is being seen 11/21/2024 for the evaluation of SVT at the request of Dr Ricky.  History of Present Illness: Juan Merritt is a 73 yo with hx of chronic HFpEF, HTN, T2DM, COPD, DVTs and  lung CA (s/p XRT/chemotherapy.  Now on immunotherapy), Presented to [facility] on 11/18/24 with chest burning, weakness, chills   In ER T101.4   BP low   Lactic acid elevated Rx for sepsis.  Pt started on broad spectrum ABX and given fluids.  Pressors used Midodrine started for BP supporrt Yesterday 12/2 pt developed SVT with HR approximately 220   Rx with 18 mg adenosine with conversion.   Pt denies dizziness or palpitations yesterday    Does not remember spell Wife says he has had some dizzy spells at home       Note pt had been seen once in cardiology in 2021 [facility]) for abnormal EKG  Noted SOB since COVID   Echo done   Normal    Myoview  showed no significant ischemia   Past Medical History:  Diagnosis Date   Arthritis    BPH associated with nocturia    Cancer (HCC)    stage III non-small cell lung cancer.   Chronic gout    COPD (chronic obstructive pulmonary disease) (HCC)    not on home o2   History of colon polyps    History of COVID-19    04/ 2019 hospital admission in epic, severe sepsis due to covid w/ acute respiratory failure;   and 05/ 2021 mild to moderate symptoms, recovered at home   Hyperlipidemia, mixed    Hypertension    Phimosis    Type 2 diabetes mellitus (HCC)    followed by pcp   (05-17-2022  pt stated checks sugar multiple times daily w/ Libre,  fasting average --- 100-110)    Past Surgical History:  Procedure Laterality Date   BRONCHIAL BIOPSY  01/22/2024   Procedure:  BRONCHIAL BIOPSIES;  Surgeon: Shelah Lamar RAMAN, Merritt;  Location: [facility] ENDOSCOPY;  Service: Pulmonary;;   BRONCHIAL BRUSHINGS  01/22/2024   Procedure: BRONCHIAL BRUSHINGS;  Surgeon: Shelah Lamar RAMAN, Merritt;  Location: [facility] ENDOSCOPY;  Service: Pulmonary;;   BRONCHIAL NEEDLE ASPIRATION BIOPSY  01/22/2024   Procedure: BRONCHIAL NEEDLE ASPIRATION BIOPSIES;  Surgeon: Shelah Lamar RAMAN, Merritt;  Location: [facility] ENDOSCOPY;  Service: Pulmonary;;   CIRCUMCISION N/A 05/19/2022   Procedure: CIRCUMCISION ADULT;  Surgeon: Juan Merritt;  Location: [facility];  Service: Urology;  Laterality: N/A;  45 MINUTES   COLONOSCOPY  12/01/2021   by dr legrand   ENDOBRONCHIAL ULTRASOUND  01/22/2024   Procedure: ENDOBRONCHIAL ULTRASOUND;  Surgeon: Shelah Lamar RAMAN, Merritt;  Location: [facility] ENDOSCOPY;  Service: Pulmonary;;   ESOPHAGEAL DILATION N/A 10/04/2024   Procedure: DILATION, ESOPHAGUS;  Surgeon: Juan Flavors, Toribio, Merritt;  Location: [facility] ENDO SUITE;  Service: Gastroenterology;  Laterality: N/A;   ESOPHAGOGASTRODUODENOSCOPY N/A 10/04/2024   Procedure: EGD (ESOPHAGOGASTRODUODENOSCOPY);  Surgeon: Juan Flavors, Toribio, Merritt;  Location: [facility] ENDO SUITE;  Service: Gastroenterology;  Laterality: N/A;   FINE NEEDLE ASPIRATION  01/22/2024   Procedure: FINE NEEDLE ASPIRATION (FNA) LINEAR;  Surgeon: Shelah Lamar RAMAN, Merritt;  Location: [facility] ENDOSCOPY;  Service:  Pulmonary;;   LAPAROSCOPIC CHOLECYSTECTOMY  2008   in [place]   LEFT HEART CATH     TONSILLECTOMY     child   VIDEO BRONCHOSCOPY WITH RADIAL ENDOBRONCHIAL ULTRASOUND  01/22/2024   Procedure: VIDEO BRONCHOSCOPY WITH RADIAL ENDOBRONCHIAL ULTRASOUND;  Surgeon: Shelah Lamar RAMAN, Merritt;  Location: [facility] ENDOSCOPY;  Service: Pulmonary;;     Home Medications:  Prior to Admission medications   Medication Sig Start Date End Date Taking? Authorizing Provider  acetaminophen  (TYLENOL ) 325 MG tablet Take 2 tablets (650 mg total) by mouth every 6 (six) hours as needed for mild pain (pain score  1-3) or fever (or Fever >/= 101). 10/04/24  Yes Emokpae, Courage, Merritt  albuterol  (VENTOLIN  HFA) 108 (90 Base) MCG/ACT inhaler Inhale 2 puffs into the lungs every 4 (four) hours as needed for wheezing or shortness of breath. 10/04/24  Yes Emokpae, Courage, Merritt  allopurinol  (ZYLOPRIM ) 100 MG tablet TAKE 1 TABLET (100 MG TOTAL) BY MOUTH DAILY. TO PREVENT GOUT 05/07/24  Yes Juan Lowers, Merritt  apixaban  (ELIQUIS ) 5 MG TABS tablet Take 1 tablet (5 mg total) by mouth 2 (two) times daily. 10/04/24  Yes Emokpae, Courage, Merritt  budesonide -formoterol  (SYMBICORT ) 160-4.5 MCG/ACT inhaler Inhale 2 puffs into the lungs 2 (two) times daily. 10/04/24  Yes Emokpae, Courage, Merritt  cyanocobalamin  (VITAMIN B12) 1000 MCG tablet Take 1 tablet (1,000 mcg total) by mouth daily. 10/04/24  Yes Emokpae, Courage, Merritt  folic acid  (FOLATE) 400 MCG tablet Take 1 tablet (400 mcg total) by mouth daily. 10/04/24  Yes Juan Manus, Merritt  gabapentin  (NEURONTIN ) 300 MG capsule Take 300 mg by mouth daily at 6 (six) AM. 11/06/24  Yes Provider, Historical, Merritt  ipratropium-albuterol  (DUONEB) 0.5-2.5 (3) MG/3ML SOLN Take 3 mLs by nebulization every 6 (six) hours as needed. 10/04/24  Yes Juan Manus, Merritt  metFORMIN  (GLUCOPHAGE -XR) 500 MG 24 hr tablet Take 1 tablet (500 mg total) by mouth daily with breakfast. 11/12/24  Yes Stacks, Lowers, Merritt  potassium chloride  SA (KLOR-CON  M20) 20 MEQ tablet TAKE 1 TABLET (20 MEQ TOTAL) DAILY BY MOUTH. Patient taking differently: 2 (two) times daily. 08/07/24  Yes Juan Lowers, Merritt  Probiotic Product (PROBIOTIC BLEND) CAPS Take 1 capsule by mouth daily.   Yes Provider, Historical, Merritt  tamsulosin  (FLOMAX ) 0.4 MG CAPS capsule TAKE 2 CAPSULES BY MOUTH AT BEDTIME FOR URINE FLOW AND PROSTATE 03/28/24  Yes Stacks, Lowers, Merritt  thiamine  (VITAMIN B-1) 100 MG tablet Take 1 tablet (100 mg total) by mouth daily. 10/05/24  Yes Emokpae, Courage, Merritt  torsemide  (DEMADEX ) 100 MG tablet Take 1 tablet (100 mg total) by mouth  daily. Patient taking differently: Take 100 mg by mouth 2 (two) times daily. 11/12/24  Yes Juan Lowers, Merritt  traMADol  (ULTRAM ) 50 MG tablet Take 1 tablet (50 mg total) by mouth 4 (four) times daily as needed for moderate pain (pain score 4-6). For cancer related pain 11/12/24  Yes Stacks, Lowers, Merritt  Continuous Glucose Sensor (FREESTYLE LIBRE 2 SENSOR) MISC Apply 1 Units topically once a week. USE TO TEST BLOOD SUGAR CONTINUOUSLY. Dx. E11.65 SEND TO [facility] FOR MEDICARE COVERAGE, DO NOT SEND TO LOCAL PHARMACY Patient not taking: Reported on 11/18/2024 10/04/24   Juan Manus, Merritt  lidocaine  (XYLOCAINE ) 2 % solution Use as directed 15 mLs in the mouth or throat every 3 (three) hours as needed (throat pain, reflux). Will have to swallow due to presence of yeast and pain in the esophagus Patient not taking: Reported on 11/18/2024 10/16/24  Juan Lowers, Merritt    Scheduled Meds:  acidophilus  1 capsule Oral Q0600   allopurinol   100 mg Oral Daily   amoxicillin -clavulanate  1 tablet Oral Q12H   apixaban   5 mg Oral BID   busPIRone  7.5 mg Oral BID   Chlorhexidine  Gluconate Cloth  6 each Topical Daily   cyanocobalamin   1,000 mcg Oral Daily   fluticasone  furoate-vilanterol  1 puff Inhalation Daily   folic acid   500 mcg Oral Daily   gabapentin  300 mg Oral BID   insulin  aspart  0-5 Units Subcutaneous QHS   insulin  aspart  0-9 Units Subcutaneous TID WC   metoprolol tartrate  2.5 mg Intravenous Q8H   midodrine  5 mg Oral TID WC   pantoprazole   40 mg Oral Daily   potassium chloride  SA  20 mEq Oral BID   tamsulosin   0.4 mg Oral QPC supper   thiamine   100 mg Oral Daily   torsemide   40 mg Oral Daily   Continuous Infusions:  PRN Meds: acetaminophen , ipratropium-albuterol , lidocaine , ondansetron  **OR** ondansetron  (ZOFRAN ) IV, mouth rinse  Allergies:    Allergies  Allergen Reactions   Statins Other (See Comments)    Myopathy     Social History:   Social History   Socioeconomic  History   Marital status: Married    Spouse name: Madeline   Number of children: 5   Years of education: 10   Highest education level: 10th grade  Occupational History   Occupation: retired naval architect  Tobacco Use   Smoking status: Former    Current packs/day: 0.00    Average packs/day: 0.3 packs/day for 40.0 years (10.0 ttl pk-yrs)    Types: Cigarettes    Start date: 01/15/1977    Quit date: 01/15/2017    Years since quitting: 7.8   Smokeless tobacco: Current    Types: Chew   Tobacco comments:    Smokes half to one pack per day for about 45 years - now also smokes e-cigarette sometimes and chews tobacco.  Updated 12/01/2023.  Vaping Use   Vaping status: Former   Substances: Nicotine    Devices: unsure  Substance and Sexual Activity   Alcohol use: Not Currently    Alcohol/week: 24.0 standard drinks of alcohol    Types: 24 Cans of beer per week    Comment: pt drinks 3-4 beers a day   Drug use: Never   Sexual activity: Not Currently  Other Topics Concern   Not on file  Social History Narrative   Lives at home with wife.  Grown children.  Retired it trainer. Children live nearby.    Social Drivers of Corporate Investment Banker Strain: Low Risk  (08/22/2024)   Overall Financial Resource Strain (CARDIA)    Difficulty of Paying Living Expenses: Not hard at all  Food Insecurity: No Food Insecurity (11/18/2024)   Hunger Vital Sign    Worried About Running Out of Food in the Last Year: Never true    Ran Out of Food in the Last Year: Never true  Transportation Needs: No Transportation Needs (11/18/2024)   PRAPARE - Administrator, Civil Service (Medical): No    Lack of Transportation (Non-Medical): No  Physical Activity: Inactive (08/22/2024)   Exercise Vital Sign    Days of Exercise per Week: 0 days    Minutes of Exercise per Session: 0 min  Stress: No Stress Concern Present (08/22/2024)   [employer] of Occupational Health - Occupational Stress Questionnaire  Feeling of Stress: Not at all  Social Connections: Moderately Isolated (11/18/2024)   Social Connection and Isolation Panel    Frequency of Communication with Friends and Family: More than three times a week    Frequency of Social Gatherings with Friends and Family: Once a week    Attends Religious Services: Never    Database Administrator or Organizations: No    Attends Banker Meetings: Never    Marital Status: Married  Catering Manager Violence: Not At Risk (11/18/2024)   Humiliation, Afraid, Rape, and Kick questionnaire    Fear of Current or Ex-Partner: No    Emotionally Abused: No    Physically Abused: No    Sexually Abused: No    Family History:    Family History  Problem Relation Age of Onset   Asthma Mother    Diabetes Father    Heart attack Father 63   Leukemia Brother    Esophageal cancer Paternal Uncle    Lung disease Other        pat cousin   Heart attack Other        pat cousin   Colon cancer Neg Hx    Stomach cancer Neg Hx      ROS:  Please see the history of present illness.   All other ROS reviewed and negative.     Physical Exam/Data: Vitals:   11/21/24 0800 11/21/24 0834 11/21/24 0900 11/21/24 0911  BP: (!) 87/69  92/63   Pulse: 86  86   Resp: 17  (!) 27   Temp:      TempSrc:      SpO2: 98% 97% 99% 96%  Weight:      Height:       No intake or output data in the 24 hours ending 11/21/24 1033    11/21/2024    5:00 AM 11/20/2024    4:20 AM 11/19/2024    6:00 AM  Last 3 Weights  Weight (lbs) 234 lb 9.1 oz 232 lb 1.6 oz 231 lb 3.2 oz  Weight (kg) 106.4 kg 105.28 kg 104.872 kg     Body mass index is 33.18 kg/m.  General:  Obese 73 yo  in no acute distress HEENT: normal Neck: no JVD Vascular: No carotid bruits; Distal pulses 2+ bilaterally Cardiac:  normal S1, S2; RRR; no murmur  Lungs:  Relatively clear   MIld rhonchi  Abd: soft, nontender, no hepatomegaly  Ext: no LE edema EKG:  The EKG was personally reviewed and  demonstrates:  Yesterday   SVT 212 bpm  ST changes considier ischemia  Telemetry:  Telemetry was personally reviewed and demonstrates:  SR   Relevant CV Studies: Pending   Myoview  2021 The left ventricular ejection fraction is mildly decreased (45-54%). Nuclear stress EF: 52%. There was no ST segment deviation noted during stress. This is a low risk study.   Poor quality study with low counts and normalization artifact to hot lateral wall Grey scale review looks normal Possible small area of apical ischemia with SDS 4 Low risk study Low normal EF 52%   Echo   2021  1. Left ventricular ejection fraction, by estimation, is 60 to 65%. The  left ventricle has normal function. The left ventricle has no regional  wall motion abnormalities. Left ventricular diastolic parameters are  indeterminate.   2. Right ventricular systolic function is normal. The right ventricular  size is normal.   3. Right atrial size was mildly dilated.   4.  The mitral valve is normal in structure. Trivial mitral valve  regurgitation.   5. The aortic valve is tricuspid. Aortic valve regurgitation is not  visualized. Mild aortic valve sclerosis is present, with no evidence of  aortic valve stenosis.   6. The inferior vena cava is normal in size with greater than 50%  respiratory variability, suggesting right atrial pressure of 3 mmHg.     Laboratory Data: High Sensitivity Troponin:  No results for input(s): TROPONINIHS in the last 720 hours.   Chemistry Recent Labs  Lab 11/18/24 0151 11/19/24 0520 11/20/24 0340  NA 135 135 138  K 3.3* 3.5 3.5  CL 94* 97* 101  CO2 26 27 28   GLUCOSE 142* 94 95  BUN 15 10 11   CREATININE 1.16 0.86 0.78  CALCIUM  8.9 8.7* 8.8*  MG  --  1.8  --   GFRNONAA >60 >60 >60  ANIONGAP 16* 11 9    Recent Labs  Lab 11/18/24 0151  PROT 7.3  ALBUMIN 3.3*  AST 29  ALT 22  ALKPHOS 112  BILITOT 0.4   Lipids No results for input(s): CHOL, TRIG, HDL, LABVLDL,  LDLCALC, CHOLHDL in the last 168 hours.  Hematology Recent Labs  Lab 11/18/24 0151 11/19/24 0520 11/20/24 0340  WBC 9.3 6.4 6.4  RBC 3.12* 3.08* 3.05*  HGB 9.7* 9.7* 9.4*  HCT 29.6* 30.0* 29.8*  MCV 94.9 97.4 97.7  MCH 31.1 31.5 30.8  MCHC 32.8 32.3 31.5  RDW 14.6 14.8 14.8  PLT 351 304 307   Thyroid   Recent Labs  Lab 11/18/24 0739  TSH 2.860    BNPNo results for input(s): BNP, PROBNP in the last 168 hours.  DDimer No results for input(s): DDIMER in the last 168 hours.  Radiology/Studies:  CT Angio Chest PE W and/or Wo Contrast Result Date: 11/18/2024 EXAM: CTA CHEST PE WITH CONTRAST CT ABDOMEN AND PELVIS WITH CONTRAST 11/18/2024 03:21:44 AM TECHNIQUE: CTA of the chest was performed with the administration of 100 mL of iohexol  (OMNIPAQUE ) 350 MG/ML injection. Multiplanar reformatted images are provided for review. MIP images are provided for review. CT of the abdomen and pelvis was performed with the administration of intravenous contrast. Automated exposure control, iterative reconstruction, and/or weight based adjustment of the mA/kV was utilized to reduce the radiation dose to as low as reasonably achievable. COMPARISON: Same-day chest x-ray. CT dated 10/18/2024. CLINICAL HISTORY: Patient felt sudden onset of weakness and was hypotensive, tachycardic, cancer patient, PE suspected. FINDINGS: CHEST: PULMONARY ARTERIES: Pulmonary arteries are adequately opacified for evaluation. Negative for pulmonary embolism. Main pulmonary artery is normal in caliber. MEDIASTINUM: No mediastinal lymphadenopathy. The heart demonstrates coronary artery atherosclerotic calcifications. Slightly increased small pericardial effusion from 10/18/2024. Aortic atherosclerotic calcifications. There is no acute abnormality of the thoracic aorta. LUNGS AND PLEURA: Bronchial wall thickening with mucous plugging greatest in the lower lobes. Irregular nodule in the right upper lobe on series 10 image 24 is  unchanged again measuring 2.3 x 1.1 cm. No focal consolidation or pulmonary edema. No pleural effusion or pneumothorax. SOFT TISSUES AND BONES: No acute bone or soft tissue abnormality. ABDOMEN AND PELVIS: LIVER: The liver is unremarkable. GALLBLADDER AND BILE DUCTS: Cholecystectomy. No biliary ductal dilatation. SPLEEN: Spleen demonstrates no acute abnormality. PANCREAS: Pancreas demonstrates no acute abnormality. ADRENAL GLANDS: Adrenal glands demonstrate no acute abnormality. KIDNEYS, URETERS AND BLADDER: No stones in the kidneys or ureters. No hydronephrosis. No perinephric or periureteral stranding. Urinary bladder is unremarkable. GI AND BOWEL: Stomach and duodenal sweep demonstrate no  acute abnormality. There is no bowel obstruction. No abnormal bowel wall thickening or distension. REPRODUCTIVE: Reproductive organs are unremarkable. PERITONEUM AND RETROPERITONEUM: No ascites or free air. LYMPH NODES: No lymphadenopathy. BONES AND SOFT TISSUES: No acute abnormality of the visualized bones. No focal soft tissue abnormality. IMPRESSION: 1. No pulmonary embolism. 2. Slightly increased small pericardial effusion from 10/18/24. 3. Irregular right upper lobe pulmonary nodule, unchanged at 2.3 x 1.1 cm. 4. No acute abnormality in the abdomen or pelvis. Electronically signed by: Norman Gatlin Merritt 11/18/2024 03:39 AM EST RP Workstation: [id number]   CT ABDOMEN PELVIS W CONTRAST Result Date: 11/18/2024 EXAM: CTA CHEST PE WITH CONTRAST CT ABDOMEN AND PELVIS WITH CONTRAST 11/18/2024 03:21:44 AM TECHNIQUE: CTA of the chest was performed with the administration of 100 mL of iohexol  (OMNIPAQUE ) 350 MG/ML injection. Multiplanar reformatted images are provided for review. MIP images are provided for review. CT of the abdomen and pelvis was performed with the administration of intravenous contrast. Automated exposure control, iterative reconstruction, and/or weight based adjustment of the mA/kV was utilized to reduce the  radiation dose to as low as reasonably achievable. COMPARISON: Same-day chest x-ray. CT dated 10/18/2024. CLINICAL HISTORY: Patient felt sudden onset of weakness and was hypotensive, tachycardic, cancer patient, PE suspected. FINDINGS: CHEST: PULMONARY ARTERIES: Pulmonary arteries are adequately opacified for evaluation. Negative for pulmonary embolism. Main pulmonary artery is normal in caliber. MEDIASTINUM: No mediastinal lymphadenopathy. The heart demonstrates coronary artery atherosclerotic calcifications. Slightly increased small pericardial effusion from 10/18/2024. Aortic atherosclerotic calcifications. There is no acute abnormality of the thoracic aorta. LUNGS AND PLEURA: Bronchial wall thickening with mucous plugging greatest in the lower lobes. Irregular nodule in the right upper lobe on series 10 image 24 is unchanged again measuring 2.3 x 1.1 cm. No focal consolidation or pulmonary edema. No pleural effusion or pneumothorax. SOFT TISSUES AND BONES: No acute bone or soft tissue abnormality. ABDOMEN AND PELVIS: LIVER: The liver is unremarkable. GALLBLADDER AND BILE DUCTS: Cholecystectomy. No biliary ductal dilatation. SPLEEN: Spleen demonstrates no acute abnormality. PANCREAS: Pancreas demonstrates no acute abnormality. ADRENAL GLANDS: Adrenal glands demonstrate no acute abnormality. KIDNEYS, URETERS AND BLADDER: No stones in the kidneys or ureters. No hydronephrosis. No perinephric or periureteral stranding. Urinary bladder is unremarkable. GI AND BOWEL: Stomach and duodenal sweep demonstrate no acute abnormality. There is no bowel obstruction. No abnormal bowel wall thickening or distension. REPRODUCTIVE: Reproductive organs are unremarkable. PERITONEUM AND RETROPERITONEUM: No ascites or free air. LYMPH NODES: No lymphadenopathy. BONES AND SOFT TISSUES: No acute abnormality of the visualized bones. No focal soft tissue abnormality. IMPRESSION: 1. No pulmonary embolism. 2. Slightly increased small  pericardial effusion from 10/18/24. 3. Irregular right upper lobe pulmonary nodule, unchanged at 2.3 x 1.1 cm. 4. No acute abnormality in the abdomen or pelvis. Electronically signed by: Norman Gatlin Merritt 11/18/2024 03:39 AM EST RP Workstation: [id number]   DG Chest Portable 1 View Result Date: 11/18/2024 EXAM: 1 VIEW(S) XRAY OF THE CHEST 11/18/2024 01:31:54 AM COMPARISON: None available. CLINICAL HISTORY: eval for weakness FINDINGS: LINES, TUBES AND DEVICES: Left chest wall port-a-cath  tip in the mid SVC. LUNGS AND PLEURA: Left basilar atelectasis or infiltrates. No pleural effusion. No pneumothorax. HEART AND MEDIASTINUM: Stable cardiomediastinal silhouette. Aortic atherosclerotic calcification. BONES AND SOFT TISSUES: No acute osseous abnormality. IMPRESSION: 1. Left basilar atelectasis or infiltrates. Electronically signed by: Norman Gatlin Merritt 11/18/2024 01:35 AM EST RP Workstation: [id number]   CT HEAD WO CONTRAST Result Date: 11/18/2024 EXAM: CT HEAD WITHOUT CONTRAST 11/18/2024 01:26:34 AM TECHNIQUE: CT of  the head was performed without the administration of intravenous contrast. Automated exposure control, iterative reconstruction, and/or weight based adjustment of the mA/kV was utilized to reduce the radiation dose to as low as reasonably achievable. COMPARISON: Comparison with 03/30/2018. CLINICAL HISTORY: Mental status change, unknown cause. FINDINGS: BRAIN AND VENTRICLES: No acute hemorrhage. No evidence of acute infarct. No hydrocephalus. No extra-axial collection. No mass effect or midline shift. ORBITS: No acute abnormality. SINUSES: No acute abnormality. SOFT TISSUES AND SKULL: No acute soft tissue abnormality. No skull fracture. IMPRESSION: 1. No acute intracranial abnormality. Electronically signed by: Norman Gatlin Merritt 11/18/2024 01:34 AM EST RP Workstation: [id number]     Assessment and Plan: SVT  Episode of SVT yesterday requiring adenosine to conver   The pt does not remember much   Denies dizziness    May have been exacerbated by infection, increased adrenergic tone His wife reports some dizzy spells at other times  Vague   Recomm:  Keep on tele I would recomm a 4 week monitor after d/c     2  CAD   Pt with CT in Feb 2025 that showed extensive coronary calcifications of LAD, RCA   Denies angina  Rx risk factors    3  HTN  SInce admit BP was low to normal  NOw on midodrine   Probably related to infection, systemic illness    I would taper  Hydrate    4  HL  Check in AM   May be low given systemic illness  5  DM  A1C 6.6  Will continue to follow   For questions or updates, please contact [facility] Please consult [email or web address] for contact info under   Signed, Vina Gull, Merritt  11/21/2024 10:33 AM

## 2024-11-21 NOTE — Progress Notes (Signed)
*  PRELIMINARY RESULTS* Echocardiogram 2D Echocardiogram has been performed.  Juan Merritt 11/21/2024, 10:47 AM

## 2024-11-21 NOTE — Plan of Care (Signed)
 Problem: Education: Goal: Knowledge of General Education information will improve Description: Including pain rating scale, medication(s)/side effects and non-pharmacologic comfort measures Outcome: Progressing   Problem: Health Behavior/Discharge Planning: Goal: Ability to manage health-related needs will improve Outcome: Progressing   Problem: Clinical Measurements: Goal: Ability to maintain clinical measurements within normal limits will improve Outcome: Progressing Goal: Will remain free from infection Outcome: Progressing Goal: Diagnostic test results will improve Outcome: Progressing Goal: Respiratory complications will improve Outcome: Progressing Goal: Cardiovascular complication will be avoided Outcome: Progressing   Problem: Activity: Goal: Risk for activity intolerance will decrease Outcome: Progressing   Problem: Nutrition: Goal: Adequate nutrition will be maintained Outcome: Progressing   Problem: Coping: Goal: Level of anxiety will decrease Outcome: Progressing   Problem: Elimination: Goal: Will not experience complications related to bowel motility Outcome: Progressing Goal: Will not experience complications related to urinary retention Outcome: Progressing   Problem: Pain Managment: Goal: General experience of comfort will improve and/or be controlled Outcome: Progressing   Problem: Safety: Goal: Ability to remain free from injury will improve Outcome: Progressing   Problem: Skin Integrity: Goal: Risk for impaired skin integrity will decrease Outcome: Progressing   Problem: Education: Goal: Ability to describe self-care measures that may prevent or decrease complications (Diabetes Survival Skills Education) will improve Outcome: Progressing Goal: Individualized Educational Video(s) Outcome: Progressing   Problem: Coping: Goal: Ability to adjust to condition or change in health will improve Outcome: Progressing   Problem: Fluid  Volume: Goal: Ability to maintain a balanced intake and output will improve Outcome: Progressing   Problem: Health Behavior/Discharge Planning: Goal: Ability to identify and utilize available resources and services will improve Outcome: Progressing Goal: Ability to manage health-related needs will improve Outcome: Progressing   Problem: Metabolic: Goal: Ability to maintain appropriate glucose levels will improve Outcome: Progressing   Problem: Nutritional: Goal: Maintenance of adequate nutrition will improve Outcome: Progressing Goal: Progress toward achieving an optimal weight will improve Outcome: Progressing   Problem: Skin Integrity: Goal: Risk for impaired skin integrity will decrease Outcome: Progressing   Problem: Tissue Perfusion: Goal: Adequacy of tissue perfusion will improve Outcome: Progressing   Problem: Fluid Volume: Goal: Hemodynamic stability will improve Outcome: Progressing   Problem: Clinical Measurements: Goal: Diagnostic test results will improve Outcome: Progressing Goal: Signs and symptoms of infection will decrease Outcome: Progressing   Problem: Respiratory: Goal: Ability to maintain adequate ventilation will improve Outcome: Progressing   Problem: Education: Goal: Ability to demonstrate management of disease process will improve Outcome: Progressing Goal: Ability to verbalize understanding of medication therapies will improve Outcome: Progressing Goal: Individualized Educational Video(s) Outcome: Progressing   Problem: Activity: Goal: Capacity to carry out activities will improve Outcome: Progressing   Problem: Cardiac: Goal: Ability to achieve and maintain adequate cardiopulmonary perfusion will improve Outcome: Progressing   Problem: Education: Goal: Knowledge of the prescribed therapeutic regimen will improve Outcome: Progressing   Problem: Activity: Goal: Ability to implement measures to reduce episodes of fatigue will  improve Outcome: Progressing   Problem: Bowel/Gastric: Goal: Will not experience complications related to bowel motility Outcome: Progressing   Problem: Coping: Goal: Ability to identify and develop effective coping behavior will improve Outcome: Progressing   Problem: Nutritional: Goal: Maintenance of adequate nutrition will improve Outcome: Progressing   Problem: Education: Goal: Knowledge of disease or condition will improve Outcome: Progressing Goal: Knowledge of the prescribed therapeutic regimen will improve Outcome: Progressing Goal: Individualized Educational Video(s) Outcome: Progressing   Problem: Activity: Goal: Ability to tolerate increased activity will improve Outcome:  Progressing Goal: Will verbalize the importance of balancing activity with adequate rest periods Outcome: Progressing

## 2024-11-22 DIAGNOSIS — E782 Mixed hyperlipidemia: Secondary | ICD-10-CM | POA: Diagnosis not present

## 2024-11-22 DIAGNOSIS — E118 Type 2 diabetes mellitus with unspecified complications: Secondary | ICD-10-CM | POA: Diagnosis not present

## 2024-11-22 DIAGNOSIS — I10 Essential (primary) hypertension: Secondary | ICD-10-CM | POA: Diagnosis not present

## 2024-11-22 DIAGNOSIS — E785 Hyperlipidemia, unspecified: Secondary | ICD-10-CM | POA: Diagnosis not present

## 2024-11-22 DIAGNOSIS — A419 Sepsis, unspecified organism: Secondary | ICD-10-CM | POA: Diagnosis not present

## 2024-11-22 DIAGNOSIS — I2510 Atherosclerotic heart disease of native coronary artery without angina pectoris: Secondary | ICD-10-CM | POA: Diagnosis not present

## 2024-11-22 DIAGNOSIS — I471 Supraventricular tachycardia, unspecified: Secondary | ICD-10-CM | POA: Diagnosis not present

## 2024-11-22 LAB — BASIC METABOLIC PANEL WITH GFR
Anion gap: 10 (ref 5–15)
BUN: 13 mg/dL (ref 8–23)
CO2: 29 mmol/L (ref 22–32)
Calcium: 8.3 mg/dL — ABNORMAL LOW (ref 8.9–10.3)
Chloride: 100 mmol/L (ref 98–111)
Creatinine, Ser: 0.9 mg/dL (ref 0.61–1.24)
GFR, Estimated: >60 mL/min (ref >60)
Glucose, Bld: 180 mg/dL — ABNORMAL HIGH (ref 70–99)
Potassium: 2.7 mmol/L — CL (ref 3.5–5.1)
Sodium: 139 mmol/L (ref 135–145)

## 2024-11-22 LAB — CBC WITH DIFFERENTIAL/PLATELET
Abs Immature Granulocytes: 0.02 K/uL (ref 0.00–0.07)
Basophils Absolute: 0 K/uL (ref 0.0–0.1)
Basophils Relative: 0 %
Eosinophils Absolute: 0.1 K/uL (ref 0.0–0.5)
Eosinophils Relative: 2 %
HCT: 29 % — ABNORMAL LOW (ref 39.0–52.0)
Hemoglobin: 9.4 g/dL — ABNORMAL LOW (ref 13.0–17.0)
Immature Granulocytes: 0 %
Lymphocytes Relative: 11 %
Lymphs Abs: 0.6 K/uL — ABNORMAL LOW (ref 0.7–4.0)
MCH: 31.3 pg (ref 26.0–34.0)
MCHC: 32.4 g/dL (ref 30.0–36.0)
MCV: 96.7 fL (ref 80.0–100.0)
Monocytes Absolute: 0.7 K/uL (ref 0.1–1.0)
Monocytes Relative: 11 %
Neutro Abs: 4.6 K/uL (ref 1.7–7.7)
Neutrophils Relative %: 76 %
Platelets: 296 K/uL (ref 150–400)
RBC: 3 MIL/uL — ABNORMAL LOW (ref 4.22–5.81)
RDW: 14.8 % (ref 11.5–15.5)
WBC: 6 K/uL (ref 4.0–10.5)
nRBC: 0 % (ref 0.0–0.2)

## 2024-11-22 LAB — GLUCOSE, CAPILLARY
Glucose-Capillary: 107 mg/dL — ABNORMAL HIGH (ref 70–99)
Glucose-Capillary: 103 mg/dL — ABNORMAL HIGH (ref 70–99)
Glucose-Capillary: 138 mg/dL — ABNORMAL HIGH (ref 70–99)
Glucose-Capillary: 131 mg/dL — ABNORMAL HIGH (ref 70–99)

## 2024-11-22 LAB — MAGNESIUM: Magnesium: 1.7 mg/dL (ref 1.7–2.4)

## 2024-11-22 LAB — CREATININE, SERUM
Creatinine, Ser: 0.92 mg/dL (ref 0.61–1.24)
GFR, Estimated: >60 mL/min (ref >60)

## 2024-11-22 LAB — LIPID PANEL
Cholesterol: 119 mg/dL (ref 0–200)
HDL: 28 mg/dL — ABNORMAL LOW (ref >40)
LDL Cholesterol: 59 mg/dL (ref 0–99)
Total CHOL/HDL Ratio: 4.3 ratio
Triglycerides: 158 mg/dL — ABNORMAL HIGH (ref <150)
VLDL: 32 mg/dL (ref 0–40)

## 2024-11-22 LAB — CORTISOL: Cortisol, Plasma: 16.6 ug/dL

## 2024-11-22 MED ORDER — LACTATED RINGERS IV BOLUS: 150.0000 mL | Freq: Once | INTRAVENOUS | Status: DC

## 2024-11-22 MED ORDER — POTASSIUM CHLORIDE CRYS ER 20 MEQ PO TBCR
40.0000 meq | EXTENDED_RELEASE_TABLET | Freq: Once | ORAL | Status: AC
  Administered 2024-11-22: 40 meq via ORAL
  Filled 2024-11-22: qty 2

## 2024-11-22 MED ORDER — METOPROLOL SUCCINATE ER 25 MG PO TB24
25.0000 mg | ORAL_TABLET | Freq: Two times a day (BID) | ORAL | Status: DC
  Administered 2024-11-23: 25 mg via ORAL
  Filled 2024-11-22: qty 1

## 2024-11-22 MED ORDER — POTASSIUM CHLORIDE CRYS ER 20 MEQ PO TBCR
40.0000 meq | EXTENDED_RELEASE_TABLET | Freq: Two times a day (BID) | ORAL | Status: DC
  Administered 2024-11-22: 20 meq via ORAL
  Administered 2024-11-22 – 2024-11-23 (×2): 40 meq via ORAL
  Filled 2024-11-22 (×3): qty 2

## 2024-11-22 MED ORDER — MIDODRINE HCL 5 MG PO TABS: 10.0000 mg | ORAL_TABLET | ORAL | Status: DC

## 2024-11-22 MED ORDER — METOPROLOL TARTRATE 5 MG/5ML IV SOLN
5.0000 mg | Freq: Three times a day (TID) | INTRAVENOUS | Status: AC
  Administered 2024-11-22 (×2): 5 mg via INTRAVENOUS
  Filled 2024-11-22 (×2): qty 5

## 2024-11-22 MED ORDER — MIDODRINE HCL 5 MG PO TABS
10.0000 mg | ORAL_TABLET | Freq: Three times a day (TID) | ORAL | Status: DC
  Administered 2024-11-22 – 2024-11-23 (×5): 10 mg via ORAL
  Filled 2024-11-22 (×5): qty 2

## 2024-11-22 MED ORDER — MIDODRINE HCL 5 MG PO TABS
10.0000 mg | ORAL_TABLET | Freq: Once | ORAL | Status: AC
  Administered 2024-11-22: 10 mg via ORAL
  Filled 2024-11-22: qty 2

## 2024-11-22 MED ORDER — SODIUM CHLORIDE 0.9 % IV BOLUS
250.0000 mL | Freq: Once | INTRAVENOUS | Status: AC
  Administered 2024-11-22: 250 mL via INTRAVENOUS

## 2024-11-22 NOTE — Progress Notes (Addendum)
   11/22/24 0308  Vitals  BP (!) 94/59  MAP (mmHg) 69  BP Location Left Arm  BP Method Automatic  Patient Position (if appropriate) Sitting  Pulse Rate (!) 135  ECG Heart Rate (!) 135  Resp (!) 36  Level of Consciousness  Level of Consciousness Alert  MEWS COLOR  MEWS Score Color Red  Oxygen  Therapy  SpO2 95 %  O2 Device Room Air  Pain Assessment  Pain Scale 0-10  Pain Score 0  MEWS Score  MEWS Temp 0  MEWS Systolic 1  MEWS Pulse 3  MEWS RR 3  MEWS LOC 0  MEWS Score 7  Provider Notification  Provider Name/Title Erminio Cone NP and Dr Shona  Date Provider Notified 11/22/24  Time Provider Notified [phone]  Method of Notification Page  Notification Reason Change in status (tachycardic)  Provider response See new orders  Date of Provider Response 11/22/24  Time of Provider Response 0334     NP Cone and Dr Shona about patients tachycardia up to 147.  EKG performed and in MUSE for provider review. N Bolus ordered as well. This RN requested CBC and BMP from provider. Awaiting further orders. Pt denies pain. Charge Engineer, Technical Sales at bedside. Kellogg RN

## 2024-11-22 NOTE — Progress Notes (Signed)
 Hypotensive overnight with SBP 83 and tachycardic with HR 131.  No diuretic given overnight.  The patient is on Torsemide  40 mg daily.  NS 250 bolus administered.    To note, the patient is also on IV Lopressor  2.5 mg q8H and midodrine  5 mg TID.  If hypotension recurs we will increase Midodrine  dose to 10 mg TID.  Continue to closely monitor and treat as indicated.   No charge note.

## 2024-11-22 NOTE — Plan of Care (Signed)
  Problem: Acute Rehab PT Goals(only PT should resolve) Goal: Patient Will Transfer Sit To/From Stand Outcome: Progressing Flowsheets (Taken 11/22/2024 1413) Patient will transfer sit to/from stand: Independently Goal: Pt Will Transfer Bed To Chair/Chair To Bed Outcome: Progressing Flowsheets (Taken 11/22/2024 1413) Pt will Transfer Bed to Chair/Chair to Bed: Independently Goal: Pt Will Perform Standing Balance Or Pre-Gait Outcome: Progressing Flowsheets (Taken 11/22/2024 1413) Pt will perform standing balance or pre-gait:  Independently  3- 5 min  with no UE support Goal: Pt Will Ambulate Outcome: Progressing Flowsheets (Taken 11/22/2024 1413) Pt will Ambulate:  100 feet  > 125 feet  Independently Goal: Pt Will Go Up/Down Stairs Outcome: Progressing Flowsheets (Taken 11/22/2024 1413) Pt will Go Up / Down Stairs:  1-2 stairs  Independently  with modified independence   Melat Wrisley, SPT

## 2024-11-22 NOTE — Progress Notes (Signed)
 PT abruptly woke from sleep from a bad dream. Pt took gown and cardiac monitoring off and walked independently to restroom. Pt was shaking. Cardiac monitor reapplied. Pt tachycardiac up to 147. Denies chest pain or shortness of breath. Kellogg RN

## 2024-11-22 NOTE — Evaluation (Signed)
 Physical Therapy Evaluation Patient Details Name: Juan Merritt MRN: [id number] DOB: 03/25/1951 Today's Date: 11/22/2024  History of Present Illness  Juan Merritt is a 73 y.o. male with medical history significant of chronic diastolic heart failure, history of lung cancer (status post radiation/chemotherapy and receiving immunotherapy), history of blood clots, COPD, BPH, hyperlipidemia, type 2 diabetes mellitus and GERD; who presented to the hospital secondary to burning chest discomfort.  Patient also expressed having some intermittent weakness/confusion and chills.  Patient denies sick contacts, focal weaknesses, nausea/vomiting, dysuria, hematuria, melena, hematochezia, or any other complaints.      Workup in the ED demonstrated patient with soft blood pressure, febrile with a Tmax of 101.4 and the presence of hypertension.  Patient lactic acid was also elevated.  CT chest with concern for bronchitic changes, negative for pulmonary embolism findings and frank infiltrates.  CT abdomen and pelvis unremarkable.  Given history of cancer/immunosuppression broad-spectrum antibiotics started after cultures taken.  Patient received 3.5 L of fluid resuscitation without improvement in his blood pressure and in the requiring pressors initiation (peripherally).     Clinical Impression  Pt. Presented w/ mild general weakness. Pt was in chair prior to treatment. Pt. Was able to perform bed mobility w/ Independence from sit to supine for supine to sit pt required mod Independence fro increased time to EOB. Pt was able to perform sit to stand transfer independently/mod Independent followed by ambulation into hallway around nursing station w/ mod independence and supervision w/o AD around nursing station. Towards the end of treatment was mildly fatigued. Pt was left in chair w/ wife  at bedside and call bell. Nursing staff was notified on pt status. Patient will benefit from continued skilled physical therapy in  hospital and recommended venue below to increase strength, balance, endurance for safe ADLs and gait.       If plan is discharge home, recommend the following: A little help with walking and/or transfers;Assist for transportation   Can travel by private vehicle    Yes    Equipment Recommendations None recommended by PT  Recommendations for Other Services       Functional Status Assessment Patient has had a recent decline in their functional status and demonstrates the ability to make significant improvements in function in a reasonable and predictable amount of time.     Precautions / Restrictions Precautions Precautions: Fall Recall of Precautions/Restrictions: Intact Restrictions Weight Bearing Restrictions Per Provider Order: No      Mobility  Bed Mobility Overal bed mobility: Independent, Modified Independent             General bed mobility comments: increased time from supine to sit EOB, Indpendent w/ sit to supine    Transfers Overall transfer level: Needs assistance Equipment used: None Transfers: Sit to/from Stand, Bed to chair/wheelchair/BSC Sit to Stand: Modified independent (Device/Increase time), Independent   Step pivot transfers: Modified independent (Device/Increase time), Independent       General transfer comment: no AD, transfer form chair to bed    Ambulation/Gait Ambulation/Gait assistance: Supervision, Modified independent (Device/Increase time) Gait Distance (Feet): 90 Feet Assistive device: None Gait Pattern/deviations: Step-through pattern, Decreased step length - right, Decreased step length - left, Decreased stance time - right       General Gait Details: ambulated around nursing station, w/o AD, gait speed normal  Stairs            Wheelchair Mobility     Tilt Bed    Modified Rankin (Stroke Patients  Only)       Balance Overall balance assessment: Needs assistance, Modified Independent   Sitting balance-Leahy  Scale: Good Sitting balance - Comments: Good at EOB     Standing balance-Leahy Scale: Good Standing balance comment: Good w/o AD                             Pertinent Vitals/Pain Pain Assessment Pain Assessment: No/denies pain    Home Living Family/patient expects to be discharged to:: Private residence Living Arrangements: Spouse/significant other Available Help at Discharge: Family;Available PRN/intermittently Type of Home: Mobile home Home Access: Ramped entrance       Home Layout: One level Home Equipment: Shower seat - built in      Prior Function Prior Level of Function : Independent/Modified Independent;Driving             Mobility Comments: no AD for ambulation, Independent ADLs Comments: independent, spouse can assist if needed     Extremity/Trunk Assessment        Lower Extremity Assessment Lower Extremity Assessment: Generalized weakness    Cervical / Trunk Assessment Cervical / Trunk Assessment: Normal  Communication   Communication Communication: No apparent difficulties Factors Affecting Communication: Hearing impaired    Cognition Arousal: Alert Behavior During Therapy: WFL for tasks assessed/performed   PT - Cognitive impairments: No apparent impairments                         Following commands: Intact       Cueing Cueing Techniques: Verbal cues     General Comments      Exercises     Assessment/Plan    PT Assessment Patient needs continued PT services  PT Problem List Decreased strength;Decreased activity tolerance;Decreased balance;Decreased mobility       PT Treatment Interventions DME instruction;Gait training;Stair training;Functional mobility training;Therapeutic activities;Balance training;Therapeutic exercise;Patient/family education    PT Goals (Current goals can be found in the Care Plan section)  Acute Rehab PT Goals Patient Stated Goal: return home PT Goal Formulation: With  patient Time For Goal Achievement: 11/26/24 Potential to Achieve Goals: Good    Frequency Min 3X/week     Co-evaluation               AM-PAC PT 6 Clicks Mobility  Outcome Measure Help needed turning from your back to your side while in a flat bed without using bedrails?: None Help needed moving from lying on your back to sitting on the side of a flat bed without using bedrails?: None Help needed moving to and from a bed to a chair (including a wheelchair)?: None Help needed standing up from a chair using your arms (e.g., wheelchair or bedside chair)?: None Help needed to walk in hospital room?: None Help needed climbing 3-5 steps with a railing? : A Little 6 Click Score: 23    End of Session   Activity Tolerance: Patient tolerated treatment well Patient left: in chair;with family/visitor present;with call bell/phone within reach Nurse Communication: Mobility status PT Visit Diagnosis: Muscle weakness (generalized) (M62.81);Other abnormalities of gait and mobility (R26.89)    Time: 1117-1140 PT Time Calculation (min) (ACUTE ONLY): 23 min   Charges:   PT Evaluation $PT Eval Moderate Complexity: 1 Mod PT Treatments $Therapeutic Activity: 23-37 mins PT General Charges $$ ACUTE PT VISIT: 1 Visit        Lennon Richins, SPT

## 2024-11-22 NOTE — Progress Notes (Signed)
 Progress Note   Patient: Juan Merritt FMW:[id number] DOB: [birth year] DOA: 11/18/2024     4 DOS: the patient was seen and examined on 11/22/2024   Brief hospital admission narrative: DAARON DIMARCO is a 73 y.o. male with medical history significant of chronic diastolic heart failure, history of lung cancer (status post radiation/chemotherapy and receiving immunotherapy), history of blood clots, COPD, BPH, hyperlipidemia, type 2 diabetes mellitus and GERD; who presented to the hospital secondary to burning chest discomfort.  Patient also expressed having some intermittent weakness/confusion and chills.  Patient denies sick contacts, focal weaknesses, nausea/vomiting, dysuria, hematuria, melena, hematochezia, or any other complaints.    Workup in the ED demonstrated patient with soft blood pressure, febrile with a Tmax of 101.4 and the presence of hypertension.  Patient lactic acid was also elevated.  CT chest with concern for bronchitic changes, negative for pulmonary embolism findings and frank infiltrates.  CT abdomen and pelvis unremarkable.  Given history of cancer/immunosuppression broad-spectrum antibiotics started after cultures taken.  Patient received 3.5 L of fluid resuscitation without improvement in his blood pressure and in the requiring pressors initiation (peripherally).     [facility] has been contacted to place patient in the hospital for further evaluation and management.  Assessment and plan  Chronic diastolic heart failure (HCC) - Stable and compensated - Initial septic shock has now resolved; patient's diuretics has been resumed. - Follow daily weights and strict intake and output - Heart healthy/low-sodium diet discussed with patient. - No crackles appreciated on exam. - Good saturation on room air appreciated.  Symptomatic SVT - Status post 18 mg of adenosine  prior to abort symptomatic elevated heart rate - Continue Lopressor  - Patient reports no palpitations or chest  pain. - 2D echo demonstrating preserved ejection fraction, no wall motion abnormalities and not significant valvular disorders. 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  Primary hypertension - Demadex  and home dose of Flomax  has been resumed - In order to minimize the chances of orthostasis midodrine  has been started. - Continue to follow orthostatic vital signs - Continue current dose of Lopressor . - Cortisol level within normal limits. - Continue the use of TED hoses/compression stocking as discussed with patient and significant other.    COPD (chronic obstructive pulmonary disease) (HCC) - No acute exacerbation appreciated - Continue bronchodilator management. - Good saturation on room air.  BPH (benign prostatic hyperplasia) - No Complaints of urinary retention currently reported - Continue Flomax .  Type 2 diabetes mellitus with complication, without long-term current use of insulin  (HCC) - A1c 6.6 - Sensitive sliding scale insulin  has been initiated - Follow CBG fluctuation and adjust medication as needed.. - Continue modified carbohydrate diet.  Hypokalemia - Electrolytes have been repleted - Will follow trend and further replete as needed - Continue adjusted dose of daily supplementation  History of lung cancer - Status postradiation and chemotherapy - Actively receiving immunotherapy until recently - Continue patient follow-up with oncology service.  Septic shock (HCC) - Presumed septic shock at time of admission; most likely driven by bronchitis. -No longer requiring pressor support - Antibiotic has been transitioned to oral Augmentin . - Continue supportive care.  Mixed hyperlipidemia - Continue heart healthy/low-fat diet. - No longer using a statin.  GERD (gastroesophageal reflux disease) - Continue PPI. 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  Class 1  obesity -Body mass index is 30.97 kg/m. -low calorie diet and portion controlled  discussed with patient  Subjective:  Afebrile, no chest pain, no nausea, no vomiting.  Soft blood pressure appreciated; patient asymptomatic with it.  Continue to follow vital signs.  Physical Exam: Vitals:   11/22/24 1250 11/22/24 1251 11/22/24 1252 11/22/24 1616  BP:  108/65    Pulse: 100 (!) 107 (!) 103   Resp:  (!) 22 16   Temp:    98.8 F (37.1 C)  TempSrc:    Oral  SpO2: 95% 97% 95%   Weight:      Height:       General exam: Alert, awake, oriented x 3; in no acute distress. Respiratory system: Good saturation on room air. Cardiovascular system: Very mild sinus tachycardia, no rubs, no gallops, no JVD.  No murmurs on exam. Gastrointestinal system: Abdomen is nondistended, soft and nontender. No organomegaly or masses felt. Normal bowel sounds heard. Central nervous system: No focal neurological deficits. Extremities: No cyanosis or clubbing.  Trace edema appreciated bilaterally. Skin: No petechiae; TED hoses in place. Psychiatry: Judgement and insight appear normal. Mood & affect appropriate.   Latest data Reviewed: Cortisol: 10.1 Base metabolic panel: Sodium 135, potassium 3.5, chloride 97, bicarb 27, BUN 10, creatinine 0.86 and GFR >60 CBC:WBC 6.4, hemoglobin 9.7 and platelet count 304K Magnesium : 1.8  Family Communication: Significant other updated at bedside.  Disposition: Status is: Inpatient Remains inpatient appropriate because: Continue current medications; antibiotic has been transitioned to oral route; follow orthostatics vital signs and continue to adjust medications prior to discharge.  Physical therapy evaluation has been requested.   Time: 50 minutes  Author: Eric Nunnery, MD 11/22/2024 4:47 PM  For on call review [email or web address].

## 2024-11-22 NOTE — Significant Event (Signed)
       CROSS COVER NOTE  NAME: Juan Merritt MRN: [id number] DOB : 05-16-1951 ATTENDING PHYSICIAN: Ricky Fines, MD    Date of Service   11/22/2024   HPI/Events of Note   [facility] Cross Cover at [facility] received from nurse via secure chat Juan Merritt's pressure down 73/54 map of 62. Pulse at rest is 120's but when he get up it goes to 140. He's going to have to go back to stepdown because we can't monitor his VS every hour on this unit.  HPI labs clinical course reviewed. Noted earlier hypotensive event Transfer order placed with dose of 15m midodrine  and 150 ml LR bolus    Interventions   Assessment/Plan: On arrival to ICU patient was transported upright in wheelchair. HR 111 pressure 111/59 (73) Patient alert and oriented, skin warm dry normal color  LR bolus canceled Continue to monitor      Erminio LITTIE Cone NP [facility] Cross Cover 7pm-7am - check amion for availability Pager [phone]

## 2024-11-22 NOTE — Progress Notes (Signed)
 Due to RED mews since 0304 and the need for frequent monitoring, patient was moved back to stepdown unit. Kellogg RN

## 2024-11-22 NOTE — Progress Notes (Addendum)
 Rounding Note   Patient Name: Juan Merritt Date of Encounter: 11/22/2024  [facility] Cardiologist: New    Subjective  Pt transferred back to ICU last night for sinus tachycardia (130s) Pt walked around ICU   Felt OK  Scheduled Meds:  acidophilus  1 capsule Oral Q0600   allopurinol   100 mg Oral Daily   amoxicillin -clavulanate  1 tablet Oral Q12H   apixaban   5 mg Oral BID   busPIRone   7.5 mg Oral BID   Chlorhexidine  Gluconate Cloth  6 each Topical Daily   cyanocobalamin   1,000 mcg Oral Daily   fluticasone  furoate-vilanterol  1 puff Inhalation Daily   folic acid   500 mcg Oral Daily   gabapentin   300 mg Oral BID   insulin  aspart  0-5 Units Subcutaneous QHS   insulin  aspart  0-9 Units Subcutaneous TID WC   metoprolol  tartrate  5 mg Intravenous Q8H   midodrine   10 mg Oral TID WC   pantoprazole   40 mg Oral Daily   potassium chloride  SA  40 mEq Oral BID   tamsulosin   0.4 mg Oral QPC supper   thiamine   100 mg Oral Daily   torsemide   40 mg Oral Daily   Continuous Infusions:  PRN Meds: acetaminophen , ipratropium-albuterol , lidocaine , ondansetron  **OR** ondansetron  (ZOFRAN ) IV, mouth rinse   Vital Signs  Vitals:   11/22/24 0730 11/22/24 0800 11/22/24 0843 11/22/24 0900  BP:  110/73  (!) 92/54  Pulse:  (!) 104  (!) 102  Resp:  16  (!) 22  Temp: 98.3 F (36.8 C)     TempSrc: Oral     SpO2:  97% 95% 95%  Weight:      Height:        Intake/Output Summary (Last 24 hours) at 11/22/2024 0950 Last data filed at 11/21/2024 1800 Gross per 24 hour  Intake 480 ml  Output --  Net 480 ml      11/22/2024    5:00 AM 11/21/2024    5:00 AM 11/20/2024    4:20 AM  Last 3 Weights  Weight (lbs) 218 lb 14.7 oz 234 lb 9.1 oz 232 lb 1.6 oz  Weight (kg) 99.3 kg 106.4 kg 105.28 kg      Telemetry SR/ST  - Personally Reviewed  ECG  No new  - Personally Reviewed  Physical Exam  GEN: No acute distress.   Neck: No JVD Cardiac: RRR, no murmurs Respiratory: Clear to  auscultation bilaterally. GI: Soft, nontender, non-distended  MS: 1+ LEedema;  Labs High Sensitivity Troponin:  No results for input(s): TROPONINIHS in the last 720 hours.   Chemistry Recent Labs  Lab 11/18/24 0151 11/19/24 0520 11/20/24 0340 11/22/24 0408  NA 135 135 138 139  K 3.3* 3.5 3.5 2.7*  CL 94* 97* 101 100  CO2 26 27 28 29   GLUCOSE 142* 94 95 180*  BUN 15 10 11 13   CREATININE 1.16 0.86 0.78 0.90  0.92  CALCIUM  8.9 8.7* 8.8* 8.3*  MG  --  1.8  --  1.7  PROT 7.3  --   --   --   ALBUMIN  3.3*  --   --   --   AST 29  --   --   --   ALT 22  --   --   --   ALKPHOS 112  --   --   --   BILITOT 0.4  --   --   --   GFRNONAA >60 >60 >60 >60  >  60  ANIONGAP 16* 11 9 10     Lipids  Recent Labs  Lab 11/22/24 0408  CHOL 119  TRIG 158*  HDL 28*  LDLCALC 59  CHOLHDL 4.3    Hematology Recent Labs  Lab 11/19/24 0520 11/20/24 0340 11/22/24 0408  WBC 6.4 6.4 6.0  RBC 3.08* 3.05* 3.00*  HGB 9.7* 9.4* 9.4*  HCT 30.0* 29.8* 29.0*  MCV 97.4 97.7 96.7  MCH 31.5 30.8 31.3  MCHC 32.3 31.5 32.4  RDW 14.8 14.8 14.8  PLT 304 307 296   Thyroid   Recent Labs  Lab 11/18/24 0739  TSH 2.860    BNPNo results for input(s): BNP, PROBNP in the last 168 hours.  DDimer No results for input(s): DDIMER in the last 168 hours.   Radiology  ECHOCARDIOGRAM COMPLETE Result Date: 11/21/2024    ECHOCARDIOGRAM REPORT   Patient Name:   LACEY DOTSON Date of Exam: 11/21/2024 Medical Rec #:  [id number]         Height:       70.5 in Accession #:    [id number]        Weight:       234.6 lb Date of Birth:  1951-08-11          BSA:          2.245 m Patient Age:    73 years          BP:           98/63 mmHg Patient Gender: M                 HR:           79 bpm. Exam Location:  [facility] Procedure: 2D Echo (Both Spectral and Color Flow Doppler were utilized during            procedure). Indications:    Other abnormalities of the heart R00.8  History:        Patient has prior history of  Echocardiogram examinations, most                 recent 07/02/2020. CHF, COPD; Risk Factors:Hypertension,                 Diabetes, Dyslipidemia and Current Smoker. Hx of lung cancer.  Sonographer:    Aida Pizza RCS Referring Phys: [phone] CARLOS MADERA IMPRESSIONS  1. Left ventricular ejection fraction, by estimation, is 55 to 60%. The left ventricle has normal function. The left ventricle has no regional wall motion abnormalities. The left ventricular internal cavity size was mildly dilated. Left ventricular diastolic parameters were normal.  2. Right ventricular systolic function is normal. The right ventricular size is normal.  3. Right atrial size was mildly dilated.  4. Trivial mitral valve regurgitation.  5. The aortic valve is tricuspid. Aortic valve regurgitation is not visualized. Aortic valve sclerosis/calcification is present, without any evidence of aortic stenosis.  6. Aortic dilatation noted. There is mild dilatation of the aortic root, measuring 41 mm.  7. The inferior vena cava is dilated in size with >50% respiratory variability, suggesting right atrial pressure of 8 mmHg. FINDINGS  Left Ventricle: Left ventricular ejection fraction, by estimation, is 55 to 60%. The left ventricle has normal function. The left ventricle has no regional wall motion abnormalities. The left ventricular internal cavity size was mildly dilated. There is  no left ventricular hypertrophy. Left ventricular diastolic parameters were normal. Right Ventricle: The right ventricular size is normal. No increase in  right ventricular wall thickness. Right ventricular systolic function is normal. Left Atrium: Left atrial size was normal in size. Right Atrium: Right atrial size was mildly dilated. Pericardium: Trivial pericardial effusion is present. Mitral Valve: There is mild thickening of the mitral valve leaflet(s). Trivial mitral valve regurgitation. Tricuspid Valve: The tricuspid valve is normal in structure. Tricuspid valve  regurgitation is trivial. Aortic Valve: The aortic valve is tricuspid. Aortic valve regurgitation is not visualized. Aortic valve sclerosis/calcification is present, without any evidence of aortic stenosis. Aortic valve mean gradient measures 3.0 mmHg. Aortic valve peak gradient measures 6.5 mmHg. Aortic valve area, by VTI measures 3.52 cm. Pulmonic Valve: The pulmonic valve was normal in structure. Pulmonic valve regurgitation is not visualized. Aorta: Aortic dilatation noted. There is mild dilatation of the aortic root, measuring 41 mm. Venous: The inferior vena cava is dilated in size with greater than 50% respiratory variability, suggesting right atrial pressure of 8 mmHg. IAS/Shunts: No atrial level shunt detected by color flow Doppler.  LEFT VENTRICLE PLAX 2D LVIDd:         5.60 cm   Diastology LVIDs:         3.00 cm   LV e' medial:    10.10 cm/s LV PW:         1.00 cm   LV E/e' medial:  8.7 LV IVS:        0.90 cm   LV e' lateral:   9.95 cm/s LVOT diam:     2.20 cm   LV E/e' lateral: 8.9 LV SV:         85 LV SV Index:   38 LVOT Area:     3.80 cm  RIGHT VENTRICLE RV S prime:     16.20 cm/s TAPSE (M-mode): 2.8 cm LEFT ATRIUM             Index        RIGHT ATRIUM           Index LA diam:        3.60 cm 1.60 cm/m   RA Area:     25.50 cm LA Vol (A2C):   78.1 ml 34.78 ml/m  RA Volume:   81.50 ml  36.29 ml/m LA Vol (A4C):   58.0 ml 25.83 ml/m LA Biplane Vol: 72.1 ml 32.11 ml/m  AORTIC VALVE AV Area (Vmax):    3.50 cm AV Area (Vmean):   3.43 cm AV Area (VTI):     3.52 cm AV Vmax:           127.00 cm/s AV Vmean:          85.200 cm/s AV VTI:            0.242 m AV Peak Grad:      6.5 mmHg AV Mean Grad:      3.0 mmHg LVOT Vmax:         117.00 cm/s LVOT Vmean:        76.900 cm/s LVOT VTI:          0.224 m LVOT/AV VTI ratio: 0.93  AORTA Ao Root diam: 4.10 cm MITRAL VALVE MV Area (PHT): 3.48 cm     SHUNTS MV Decel Time: 218 msec     Systemic VTI:  0.22 m MV E velocity: 88.30 cm/s   Systemic Diam: 2.20 cm MV A  velocity: 105.00 cm/s MV E/A ratio:  0.84 Vina Gull MD Electronically signed by Vina Gull MD Signature Date/Time: 11/21/2024/3:35:52 PM    Final  Cardiac Studies Echo  11/21/2024  1. Left ventricular ejection fraction, by estimation, is 55 to 60%. The  left ventricle has normal function. The left ventricle has no regional  wall motion abnormalities. The left ventricular internal cavity size was  mildly dilated. Left ventricular  diastolic parameters were normal.   2. Right ventricular systolic function is normal. The right ventricular  size is normal.   3. Right atrial size was mildly dilated.   4. Trivial mitral valve regurgitation.   5. The aortic valve is tricuspid. Aortic valve regurgitation is not  visualized. Aortic valve sclerosis/calcification is present, without any  evidence of aortic stenosis.   6. Aortic dilatation noted. There is mild dilatation of the aortic root,  measuring 41 mm.   7. The inferior vena cava is dilated in size with >50% respiratory  variability, suggesting right atrial pressure of 8 mmHg.   Patient Profile   73 y.o. male  admitted with sepsis.   Asked to see for SVT    Assessment & Plan   1  SVT  Episode occurred 2 days ago   HR 220s    Lasted about 45 min   Pt denied palpitations   Treated with 18 mg adenosine . No recurrence   Follow    \ BP and HR should improve as he recovers  Encourage PO intake   Check orthostatics   Will make sure pt has appt in follow up after d/c   2  CAD   Pt denies CP  Follow   3  Hx HTN  Pt currently on midodrine  to help bp    Follow as recovers    Cortisol is normal   4  HL   LDL 59  HDL 28  Trig 158  Total 119    5  DM  A1C 6.6  Signed, Vina Gull, MD  11/22/2024, 9:50 AM

## 2024-11-23 DIAGNOSIS — E118 Type 2 diabetes mellitus with unspecified complications: Secondary | ICD-10-CM | POA: Diagnosis not present

## 2024-11-23 DIAGNOSIS — A419 Sepsis, unspecified organism: Secondary | ICD-10-CM | POA: Diagnosis not present

## 2024-11-23 DIAGNOSIS — E876 Hypokalemia: Secondary | ICD-10-CM | POA: Diagnosis not present

## 2024-11-23 DIAGNOSIS — I10 Essential (primary) hypertension: Secondary | ICD-10-CM | POA: Diagnosis not present

## 2024-11-23 LAB — CBC
HCT: 31.3 % — ABNORMAL LOW (ref 39.0–52.0)
Hemoglobin: 9.8 g/dL — ABNORMAL LOW (ref 13.0–17.0)
MCH: 30.3 pg (ref 26.0–34.0)
MCHC: 31.3 g/dL (ref 30.0–36.0)
MCV: 96.9 fL (ref 80.0–100.0)
Platelets: 278 K/uL (ref 150–400)
RBC: 3.23 MIL/uL — ABNORMAL LOW (ref 4.22–5.81)
RDW: 15 % (ref 11.5–15.5)
WBC: 6.1 K/uL (ref 4.0–10.5)
nRBC: 0 % (ref 0.0–0.2)

## 2024-11-23 LAB — COMPREHENSIVE METABOLIC PANEL WITH GFR
ALT: 147 U/L — ABNORMAL HIGH (ref 0–44)
AST: 229 U/L — ABNORMAL HIGH (ref 15–41)
Albumin: 3.2 g/dL — ABNORMAL LOW (ref 3.5–5.0)
Alkaline Phosphatase: 506 U/L — ABNORMAL HIGH (ref 38–126)
Anion gap: 6 (ref 5–15)
BUN: 13 mg/dL (ref 8–23)
CO2: 35 mmol/L — ABNORMAL HIGH (ref 22–32)
Calcium: 8.8 mg/dL — ABNORMAL LOW (ref 8.9–10.3)
Chloride: 99 mmol/L (ref 98–111)
Creatinine, Ser: 0.85 mg/dL (ref 0.61–1.24)
GFR, Estimated: >60 mL/min (ref >60)
Glucose, Bld: 96 mg/dL (ref 70–99)
Potassium: 3.8 mmol/L (ref 3.5–5.1)
Sodium: 139 mmol/L (ref 135–145)
Total Bilirubin: 1.6 mg/dL — ABNORMAL HIGH (ref 0.0–1.2)
Total Protein: 7.2 g/dL (ref 6.5–8.1)

## 2024-11-23 LAB — GLUCOSE, CAPILLARY: Glucose-Capillary: 161 mg/dL — ABNORMAL HIGH (ref 70–99)

## 2024-11-23 LAB — CULTURE, BLOOD (ROUTINE X 2)
Culture: NO GROWTH
Culture: NO GROWTH
Special Requests: ADEQUATE
Special Requests: ADEQUATE

## 2024-11-23 MED ORDER — TAMSULOSIN HCL 0.4 MG PO CAPS: 0.4000 mg | ORAL_CAPSULE | Freq: Every day | ORAL | 2 refills | Status: AC

## 2024-11-23 MED ORDER — MIDODRINE HCL 10 MG PO TABS: 10.0000 mg | ORAL_TABLET | Freq: Three times a day (TID) | ORAL | 1 refills | Status: AC

## 2024-11-23 MED ORDER — PANTOPRAZOLE SODIUM 40 MG PO TBEC: 40.0000 mg | DELAYED_RELEASE_TABLET | Freq: Every day | ORAL | 1 refills | Status: AC

## 2024-11-23 MED ORDER — AMOXICILLIN-POT CLAVULANATE 875-125 MG PO TABS: 1.0000 | ORAL_TABLET | Freq: Two times a day (BID) | ORAL | 0 refills | Status: AC

## 2024-11-23 MED ORDER — TORSEMIDE 40 MG PO TABS: 40.0000 mg | ORAL_TABLET | Freq: Every day | ORAL | 2 refills | Status: DC

## 2024-11-23 MED ORDER — BUSPIRONE HCL 7.5 MG PO TABS: 7.5000 mg | ORAL_TABLET | Freq: Two times a day (BID) | ORAL | 1 refills | Status: AC

## 2024-11-23 MED ORDER — METOPROLOL SUCCINATE ER 25 MG PO TB24: 25.0000 mg | ORAL_TABLET | Freq: Two times a day (BID) | ORAL | 1 refills | Status: AC

## 2024-11-23 MED ORDER — GABAPENTIN 300 MG PO CAPS: 300.0000 mg | ORAL_CAPSULE | Freq: Two times a day (BID) | ORAL | 2 refills | Status: AC

## 2024-11-23 MED ORDER — RISAQUAD PO CAPS: 1.0000 | ORAL_CAPSULE | Freq: Every day | ORAL | Status: AC

## 2024-11-23 MED ORDER — POTASSIUM CHLORIDE CRYS ER 20 MEQ PO TBCR: 40.0000 meq | EXTENDED_RELEASE_TABLET | Freq: Two times a day (BID) | ORAL | 1 refills | Status: AC

## 2024-11-23 NOTE — Plan of Care (Signed)
 Problem: Education: Goal: Knowledge of General Education information will improve Description: Including pain rating scale, medication(s)/side effects and non-pharmacologic comfort measures Outcome: Progressing   Problem: Health Behavior/Discharge Planning: Goal: Ability to manage health-related needs will improve Outcome: Progressing   Problem: Clinical Measurements: Goal: Ability to maintain clinical measurements within normal limits will improve Outcome: Progressing Goal: Will remain free from infection Outcome: Progressing Goal: Diagnostic test results will improve Outcome: Progressing Goal: Respiratory complications will improve Outcome: Progressing Goal: Cardiovascular complication will be avoided Outcome: Progressing   Problem: Activity: Goal: Risk for activity intolerance will decrease Outcome: Progressing   Problem: Nutrition: Goal: Adequate nutrition will be maintained Outcome: Progressing   Problem: Coping: Goal: Level of anxiety will decrease Outcome: Progressing   Problem: Elimination: Goal: Will not experience complications related to bowel motility Outcome: Progressing Goal: Will not experience complications related to urinary retention Outcome: Progressing   Problem: Pain Managment: Goal: General experience of comfort will improve and/or be controlled Outcome: Progressing   Problem: Safety: Goal: Ability to remain free from injury will improve Outcome: Progressing   Problem: Skin Integrity: Goal: Risk for impaired skin integrity will decrease Outcome: Progressing   Problem: Education: Goal: Ability to describe self-care measures that may prevent or decrease complications (Diabetes Survival Skills Education) will improve Outcome: Progressing Goal: Individualized Educational Video(s) Outcome: Progressing   Problem: Coping: Goal: Ability to adjust to condition or change in health will improve Outcome: Progressing   Problem: Fluid  Volume: Goal: Ability to maintain a balanced intake and output will improve Outcome: Progressing   Problem: Health Behavior/Discharge Planning: Goal: Ability to identify and utilize available resources and services will improve Outcome: Progressing Goal: Ability to manage health-related needs will improve Outcome: Progressing   Problem: Metabolic: Goal: Ability to maintain appropriate glucose levels will improve Outcome: Progressing   Problem: Nutritional: Goal: Maintenance of adequate nutrition will improve Outcome: Progressing Goal: Progress toward achieving an optimal weight will improve Outcome: Progressing   Problem: Skin Integrity: Goal: Risk for impaired skin integrity will decrease Outcome: Progressing   Problem: Tissue Perfusion: Goal: Adequacy of tissue perfusion will improve Outcome: Progressing   Problem: Fluid Volume: Goal: Hemodynamic stability will improve Outcome: Progressing   Problem: Clinical Measurements: Goal: Diagnostic test results will improve Outcome: Progressing Goal: Signs and symptoms of infection will decrease Outcome: Progressing   Problem: Respiratory: Goal: Ability to maintain adequate ventilation will improve Outcome: Progressing   Problem: Education: Goal: Ability to demonstrate management of disease process will improve Outcome: Progressing Goal: Ability to verbalize understanding of medication therapies will improve Outcome: Progressing Goal: Individualized Educational Video(s) Outcome: Progressing   Problem: Activity: Goal: Capacity to carry out activities will improve Outcome: Progressing   Problem: Cardiac: Goal: Ability to achieve and maintain adequate cardiopulmonary perfusion will improve Outcome: Progressing   Problem: Education: Goal: Knowledge of the prescribed therapeutic regimen will improve Outcome: Progressing   Problem: Activity: Goal: Ability to implement measures to reduce episodes of fatigue will  improve Outcome: Progressing   Problem: Bowel/Gastric: Goal: Will not experience complications related to bowel motility Outcome: Progressing   Problem: Coping: Goal: Ability to identify and develop effective coping behavior will improve Outcome: Progressing   Problem: Nutritional: Goal: Maintenance of adequate nutrition will improve Outcome: Progressing   Problem: Education: Goal: Knowledge of disease or condition will improve Outcome: Progressing Goal: Knowledge of the prescribed therapeutic regimen will improve Outcome: Progressing Goal: Individualized Educational Video(s) Outcome: Progressing   Problem: Activity: Goal: Ability to tolerate increased activity will improve Outcome:  Progressing Goal: Will verbalize the importance of balancing activity with adequate rest periods Outcome: Progressing

## 2024-11-23 NOTE — Discharge Summary (Signed)
 Physician Discharge Summary   Patient: Juan Merritt MRN: [id number] DOB: [birth year]  Admit date:     11/18/2024  Discharge date: 11/23/24  Discharge Physician: Juan Merritt   PCP: Juan Lowers, MD   Recommendations at discharge:  Repeat complete metabolic panel to follow electrolytes, LFTs and renal function Reassess blood pressure and adjust medication as needed; patient will benefit of continuing working to wean off/taper midodrine . Repeat CBC to follow hemoglobin trend/stability Make sure patient follow-up with cardiology service as instructed for 30 days event monitoring as recommended. Continue assisting patient with weight loss management.  Discharge Diagnoses: Principal Problem:   Septic shock (HCC) Active Problems:   COPD (chronic obstructive pulmonary disease) (HCC)   Primary hypertension   Mixed hyperlipidemia   Chronic diastolic heart failure (HCC)   History of lung cancer   GERD (gastroesophageal reflux disease)   Hypokalemia   Type 2 diabetes mellitus with complication, without long-term current use of insulin  (HCC)   BPH (benign prostatic hyperplasia)  Brief hospital admission narrative: Juan Merritt is a 73 y.o. male with medical history significant of chronic diastolic heart failure, history of lung cancer (status post radiation/chemotherapy and receiving immunotherapy), history of blood clots, COPD, BPH, hyperlipidemia, type 2 diabetes mellitus and GERD; who presented to the hospital secondary to burning chest discomfort.  Patient also expressed having some intermittent weakness/confusion and chills.  Patient denies sick contacts, focal weaknesses, nausea/vomiting, dysuria, hematuria, melena, hematochezia, or any other complaints.    Workup in the ED demonstrated patient with soft blood pressure, febrile with a Tmax of 101.4 and the presence of hypertension.  Patient lactic acid was also elevated.  CT chest with concern for bronchitic changes, negative for  pulmonary embolism findings and frank infiltrates.  CT abdomen and pelvis unremarkable.  Given history of cancer/immunosuppression broad-spectrum antibiotics started after cultures taken.  Patient received 3.5 L of fluid resuscitation without improvement in his blood pressure and in the requiring pressors initiation (peripherally).     [facility] has been contacted to place patient in the hospital for further evaluation and management.  Assessment and Plan: Chronic diastolic heart failure (HCC) - Stable and compensated - Initial septic shock has now resolved; patient's diuretics has been resumed. - Follow daily weights and strict intake and output - Heart healthy/low-sodium diet discussed with patient. - No crackles appreciated on exam. - Good saturation on room air appreciated.   Symptomatic SVT - Status post 18 mg of adenosine  prior to abort symptomatic elevated heart rate - Continue metoprolol  (Toprol  XL 25 mg twice a day has been prescribed at discharge). - Patient reports no palpitations or chest pain. - 2D echo demonstrating preserved ejection fraction, no wall motion abnormalities and not significant valvular disorders. 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 -Patient will follow-up with cardiology service with intention for 30 days event monitoring after discharge.   Primary hypertension - Demadex  and home dose of Flomax  has been resumed - In order to minimize the chances of orthostasis midodrine  has been started. - Continue to follow orthostatic vital signs - Continue current dose of Lopressor . - Cortisol level within normal limits. - Continue the use of TED hoses/compression stocking as discussed with patient and significant other.     COPD (chronic obstructive pulmonary disease) (HCC) - No acute exacerbation appreciated - Continue bronchodilator management. - Good saturation on room air.   BPH (benign prostatic hyperplasia) - No Complaints of urinary  retention currently reported - Continue Flomax .  Type 2 diabetes mellitus with complication, without long-term current use of insulin  (HCC) - A1c 6.6 - Continue to follow CBG fluctuation and further adjust management as required. - Continue modified carbohydrate diet.   Hypokalemia - Electrolytes have been repleted - Will follow trend and further replete as needed - Continue daily supplementation (dose has been adjusted - At discharge potassium within normal limits.   History of lung cancer - Status postradiation and chemotherapy - Actively receiving immunotherapy until recently - Continue patient follow-up with oncology service.   Septic shock (HCC) - Presumed septic shock at time of admission; most likely driven by bronchitis. -No longer requiring pressor support - Antibiotic has been transitioned to oral Augmentin . - At discharge patient's sepsis features resolved - Adequate hydration has been discussed with patient.   Mixed hyperlipidemia - Continue heart healthy/low-fat diet. - No longer using statin.   GERD (gastroesophageal reflux disease) - Continue PPI. 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   Class 1 obesity -Body mass index is 30.97 kg/m. -low calorie diet and portion controlled discussed with patient  Mild transaminitis - Most likely as part of effects from the use of Augmentin  - Adequate hydration-discussed with patient - 3 more days of Augmentin  pending - Continue to follow LFTs trend with repeat complete metabolic panel at follow-up visit.  Anxiety: - Patient has been discharged on BuSpar  - Continue patient follow-up with PCP.  Consultants: Cardiology service Procedures performed: See below for x-ray report; 2D echo. Disposition: Home with home health services. Diet recommendation: Heart healthy/low-sodium diet.  DISCHARGE MEDICATION: Allergies as of 11/23/2024        Reactions   Statins Other (See Comments)   Myopathy        Medication List     STOP taking these medications    PROBIOTIC ACIDOPHILUS PO Replaced by: acidophilus Caps capsule       TAKE these medications    acetaminophen  325 MG tablet Commonly known as: TYLENOL  Take 2 tablets (650 mg total) by mouth every 6 (six) hours as needed for mild pain (pain score 1-3) or fever (or Fever >/= 101).   acidophilus Caps capsule Take 1 capsule by mouth daily at 6 (six) AM. Start taking on: November 24, 2024 What changed: when to take this Replaces: PROBIOTIC ACIDOPHILUS PO   albuterol  108 (90 Base) MCG/ACT inhaler Commonly known as: VENTOLIN  HFA Inhale 2 puffs into the lungs every 4 (four) hours as needed for wheezing or shortness of breath.   allopurinol  100 MG tablet Commonly known as: ZYLOPRIM  TAKE 1 TABLET (100 MG TOTAL) BY MOUTH DAILY. TO PREVENT GOUT   amoxicillin -clavulanate 875-125 MG tablet Commonly known as: AUGMENTIN  Take 1 tablet by mouth every 12 (twelve) hours for 3 days.   apixaban  5 MG Tabs tablet Commonly known as: ELIQUIS  Take 1 tablet (5 mg total) by mouth 2 (two) times daily.   budesonide -formoterol  160-4.5 MCG/ACT inhaler Commonly known as: Symbicort  Inhale 2 puffs into the lungs 2 (two) times daily.   busPIRone  7.5 MG tablet Commonly known as: BUSPAR  Take 1 tablet (7.5 mg total) by mouth 2 (two) times daily.   cyanocobalamin  1000 MCG tablet Commonly known as: VITAMIN B12 Take 1 tablet (1,000 mcg total) by mouth daily.   folic acid  400 MCG tablet Commonly known as: Folate Take 1 tablet (400 mcg total) by mouth daily.   FreeStyle Libre 2 Sensor Misc Apply 1 Units topically once a week. USE  TO TEST BLOOD SUGAR CONTINUOUSLY. Dx. E11.65 SEND TO [facility] FOR MEDICARE COVERAGE, DO NOT SEND TO LOCAL PHARMACY   gabapentin  300 MG capsule Commonly known as: NEURONTIN  Take 1 capsule (300 mg total) by mouth 2 (two) times daily. What changed: when  to take this   ipratropium-albuterol  0.5-2.5 (3) MG/3ML Soln Commonly known as: DUONEB Take 3 mLs by nebulization every 6 (six) hours as needed.   lidocaine  2 % solution Commonly known as: XYLOCAINE  Use as directed 15 mLs in the mouth or throat every 3 (three) hours as needed (throat pain, reflux). Will have to swallow due to presence of yeast and pain in the esophagus   metFORMIN  500 MG 24 hr tablet Commonly known as: GLUCOPHAGE -XR Take 1 tablet (500 mg total) by mouth daily with breakfast.   metoprolol  succinate 25 MG 24 hr tablet Commonly known as: TOPROL -XL Take 1 tablet (25 mg total) by mouth 2 (two) times daily.   midodrine  10 MG tablet Commonly known as: PROAMATINE  Take 1 tablet (10 mg total) by mouth 3 (three) times daily with meals.   pantoprazole  40 MG tablet Commonly known as: PROTONIX  Take 1 tablet (40 mg total) by mouth daily. Start taking on: November 24, 2024   potassium chloride  SA 20 MEQ tablet Commonly known as: Klor-Con  M20 Take 2 tablets (40 mEq total) by mouth 2 (two) times daily. What changed:  how much to take how to take this when to take this additional instructions   tamsulosin  0.4 MG Caps capsule Commonly known as: FLOMAX  Take 1 capsule (0.4 mg total) by mouth daily after supper. What changed:  how much to take how to take this when to take this additional instructions   thiamine  100 MG tablet Commonly known as: Vitamin B-1 Take 1 tablet (100 mg total) by mouth daily.   Torsemide  40 MG Tabs Take 40 mg by mouth daily. Start taking on: November 24, 2024 What changed:  medication strength how much to take   traMADol  50 MG tablet Commonly known as: ULTRAM  Take 1 tablet (50 mg total) by mouth 4 (four) times daily as needed for moderate pain (pain score 4-6). For cancer related pain        Follow-up Information     Johnson Laymon HERO, PA-C Follow up.   Specialties: Cardiology, Cardiology Why: Cardiology Hospital Follow-up on  12/04/2024 at 1:30 PM. Contact information: [address] [place] [address] [phone]         Juan Lowers, MD. Schedule an appointment as soon as possible for a visit in 10 day(s).   Specialty: Family Medicine Contact information: [address] [place] [address] [phone]                Discharge Exam: Fredricka Weights   11/21/24 0500 11/22/24 0500 11/23/24 0500  Weight: 106.4 kg 99.3 kg 100.3 kg   General exam: Alert, awake, oriented x 3; no lightheadedness, no dizziness, no requiring oxygen  supplementation.  Feeling ready to go home. Respiratory system: Good air movement bilaterally. Cardiovascular system: Rate controlled, no rubs, no gallops, no JVD. Gastrointestinal system: Abdomen is nondistended, soft and nontender. No organomegaly or masses felt. Normal bowel sounds heard. Central nervous system: Moving 4 limbs spontaneously.  No focal neurological deficits. Extremities: No cyanosis or clubbing; trace edema bilaterally appreciated on exam. Skin: No petechiae. Psychiatry: Judgement and insight appear normal.  Flat affect.  Condition at discharge: Stable and improved.  The results of significant diagnostics from this hospitalization (including imaging,  microbiology, ancillary and laboratory) are listed below for reference.   Imaging Studies: ECHOCARDIOGRAM COMPLETE Result Date: 11/21/2024    ECHOCARDIOGRAM REPORT   Patient Name:   DHAVAL WOO Date of Exam: 11/21/2024 Medical Rec #:  [id number]         Height:       70.5 in Accession #:    [id number]        Weight:       234.6 lb Date of Birth:  1951/09/02          BSA:          2.245 m Patient Age:    73 years          BP:           98/63 mmHg Patient Gender: M                 HR:           79 bpm. Exam Location:  [facility] Procedure: 2D Echo (Both Spectral and Color Flow Doppler were utilized during            procedure). Indications:    Other abnormalities of the heart R00.8  History:        Patient  has prior history of Echocardiogram examinations, most                 recent 07/02/2020. CHF, COPD; Risk Factors:Hypertension,                 Diabetes, Dyslipidemia and Current Smoker. Hx of lung cancer.  Sonographer:    Aida Pizza RCS Referring Phys: [phone] Jarquis Walker IMPRESSIONS  1. Left ventricular ejection fraction, by estimation, is 55 to 60%. The left ventricle has normal function. The left ventricle has no regional wall motion abnormalities. The left ventricular internal cavity size was mildly dilated. Left ventricular diastolic parameters were normal.  2. Right ventricular systolic function is normal. The right ventricular size is normal.  3. Right atrial size was mildly dilated.  4. Trivial mitral valve regurgitation.  5. The aortic valve is tricuspid. Aortic valve regurgitation is not visualized. Aortic valve sclerosis/calcification is present, without any evidence of aortic stenosis.  6. Aortic dilatation noted. There is mild dilatation of the aortic root, measuring 41 mm.  7. The inferior vena cava is dilated in size with >50% respiratory variability, suggesting right atrial pressure of 8 mmHg. FINDINGS  Left Ventricle: Left ventricular ejection fraction, by estimation, is 55 to 60%. The left ventricle has normal function. The left ventricle has no regional wall motion abnormalities. The left ventricular internal cavity size was mildly dilated. There is  no left ventricular hypertrophy. Left ventricular diastolic parameters were normal. Right Ventricle: The right ventricular size is normal. No increase in right ventricular wall thickness. Right ventricular systolic function is normal. Left Atrium: Left atrial size was normal in size. Right Atrium: Right atrial size was mildly dilated. Pericardium: Trivial pericardial effusion is present. Mitral Valve: There is mild thickening of the mitral valve leaflet(s). Trivial mitral valve regurgitation. Tricuspid Valve: The tricuspid valve is normal in  structure. Tricuspid valve regurgitation is trivial. Aortic Valve: The aortic valve is tricuspid. Aortic valve regurgitation is not visualized. Aortic valve sclerosis/calcification is present, without any evidence of aortic stenosis. Aortic valve mean gradient measures 3.0 mmHg. Aortic valve peak gradient measures 6.5 mmHg. Aortic valve area, by VTI measures 3.52 cm. Pulmonic Valve: The pulmonic valve was normal in structure. Pulmonic valve regurgitation is  not visualized. Aorta: Aortic dilatation noted. There is mild dilatation of the aortic root, measuring 41 mm. Venous: The inferior vena cava is dilated in size with greater than 50% respiratory variability, suggesting right atrial pressure of 8 mmHg. IAS/Shunts: No atrial level shunt detected by color flow Doppler.  LEFT VENTRICLE PLAX 2D LVIDd:         5.60 cm   Diastology LVIDs:         3.00 cm   LV e' medial:    10.10 cm/s LV PW:         1.00 cm   LV E/e' medial:  8.7 LV IVS:        0.90 cm   LV e' lateral:   9.95 cm/s LVOT diam:     2.20 cm   LV E/e' lateral: 8.9 LV SV:         85 LV SV Index:   38 LVOT Area:     3.80 cm  RIGHT VENTRICLE RV S prime:     16.20 cm/s TAPSE (M-mode): 2.8 cm LEFT ATRIUM             Index        RIGHT ATRIUM           Index LA diam:        3.60 cm 1.60 cm/m   RA Area:     25.50 cm LA Vol (A2C):   78.1 ml 34.78 ml/m  RA Volume:   81.50 ml  36.29 ml/m LA Vol (A4C):   58.0 ml 25.83 ml/m LA Biplane Vol: 72.1 ml 32.11 ml/m  AORTIC VALVE AV Area (Vmax):    3.50 cm AV Area (Vmean):   3.43 cm AV Area (VTI):     3.52 cm AV Vmax:           127.00 cm/s AV Vmean:          85.200 cm/s AV VTI:            0.242 m AV Peak Grad:      6.5 mmHg AV Mean Grad:      3.0 mmHg LVOT Vmax:         117.00 cm/s LVOT Vmean:        76.900 cm/s LVOT VTI:          0.224 m LVOT/AV VTI ratio: 0.93  AORTA Ao Root diam: 4.10 cm MITRAL VALVE MV Area (PHT): 3.48 cm     SHUNTS MV Decel Time: 218 msec     Systemic VTI:  0.22 m MV E velocity: 88.30 cm/s    Systemic Diam: 2.20 cm MV A velocity: 105.00 cm/s MV E/A ratio:  0.84 Vina Gull MD Electronically signed by Vina Gull MD Signature Date/Time: 11/21/2024/3:35:52 PM    Final    CT Angio Chest PE W and/or Wo Contrast Result Date: 11/18/2024 EXAM: CTA CHEST PE WITH CONTRAST CT ABDOMEN AND PELVIS WITH CONTRAST 11/18/2024 03:21:44 AM TECHNIQUE: CTA of the chest was performed with the administration of 100 mL of iohexol  (OMNIPAQUE ) 350 MG/ML injection. Multiplanar reformatted images are provided for review. MIP images are provided for review. CT of the abdomen and pelvis was performed with the administration of intravenous contrast. Automated exposure control, iterative reconstruction, and/or weight based adjustment of the mA/kV was utilized to reduce the radiation dose to as low as reasonably achievable. COMPARISON: Same-day chest x-ray. CT dated 10/18/2024. CLINICAL HISTORY: Patient felt sudden onset of weakness and was hypotensive, tachycardic, cancer patient, PE suspected.  FINDINGS: CHEST: PULMONARY ARTERIES: Pulmonary arteries are adequately opacified for evaluation. Negative for pulmonary embolism. Main pulmonary artery is normal in caliber. MEDIASTINUM: No mediastinal lymphadenopathy. The heart demonstrates coronary artery atherosclerotic calcifications. Slightly increased small pericardial effusion from 10/18/2024. Aortic atherosclerotic calcifications. There is no acute abnormality of the thoracic aorta. LUNGS AND PLEURA: Bronchial wall thickening with mucous plugging greatest in the lower lobes. Irregular nodule in the right upper lobe on series 10 image 24 is unchanged again measuring 2.3 x 1.1 cm. No focal consolidation or pulmonary edema. No pleural effusion or pneumothorax. SOFT TISSUES AND BONES: No acute bone or soft tissue abnormality. ABDOMEN AND PELVIS: LIVER: The liver is unremarkable. GALLBLADDER AND BILE DUCTS: Cholecystectomy. No biliary ductal dilatation. SPLEEN: Spleen demonstrates no acute  abnormality. PANCREAS: Pancreas demonstrates no acute abnormality. ADRENAL GLANDS: Adrenal glands demonstrate no acute abnormality. KIDNEYS, URETERS AND BLADDER: No stones in the kidneys or ureters. No hydronephrosis. No perinephric or periureteral stranding. Urinary bladder is unremarkable. GI AND BOWEL: Stomach and duodenal sweep demonstrate no acute abnormality. There is no bowel obstruction. No abnormal bowel wall thickening or distension. REPRODUCTIVE: Reproductive organs are unremarkable. PERITONEUM AND RETROPERITONEUM: No ascites or free air. LYMPH NODES: No lymphadenopathy. BONES AND SOFT TISSUES: No acute abnormality of the visualized bones. No focal soft tissue abnormality. IMPRESSION: 1. No pulmonary embolism. 2. Slightly increased small pericardial effusion from 10/18/24. 3. Irregular right upper lobe pulmonary nodule, unchanged at 2.3 x 1.1 cm. 4. No acute abnormality in the abdomen or pelvis. Electronically signed by: Norman Gatlin MD 11/18/2024 03:39 AM EST RP Workstation: [id number]   CT ABDOMEN PELVIS W CONTRAST Result Date: 11/18/2024 EXAM: CTA CHEST PE WITH CONTRAST CT ABDOMEN AND PELVIS WITH CONTRAST 11/18/2024 03:21:44 AM TECHNIQUE: CTA of the chest was performed with the administration of 100 mL of iohexol  (OMNIPAQUE ) 350 MG/ML injection. Multiplanar reformatted images are provided for review. MIP images are provided for review. CT of the abdomen and pelvis was performed with the administration of intravenous contrast. Automated exposure control, iterative reconstruction, and/or weight based adjustment of the mA/kV was utilized to reduce the radiation dose to as low as reasonably achievable. COMPARISON: Same-day chest x-ray. CT dated 10/18/2024. CLINICAL HISTORY: Patient felt sudden onset of weakness and was hypotensive, tachycardic, cancer patient, PE suspected. FINDINGS: CHEST: PULMONARY ARTERIES: Pulmonary arteries are adequately opacified for evaluation. Negative for pulmonary embolism.  Main pulmonary artery is normal in caliber. MEDIASTINUM: No mediastinal lymphadenopathy. The heart demonstrates coronary artery atherosclerotic calcifications. Slightly increased small pericardial effusion from 10/18/2024. Aortic atherosclerotic calcifications. There is no acute abnormality of the thoracic aorta. LUNGS AND PLEURA: Bronchial wall thickening with mucous plugging greatest in the lower lobes. Irregular nodule in the right upper lobe on series 10 image 24 is unchanged again measuring 2.3 x 1.1 cm. No focal consolidation or pulmonary edema. No pleural effusion or pneumothorax. SOFT TISSUES AND BONES: No acute bone or soft tissue abnormality. ABDOMEN AND PELVIS: LIVER: The liver is unremarkable. GALLBLADDER AND BILE DUCTS: Cholecystectomy. No biliary ductal dilatation. SPLEEN: Spleen demonstrates no acute abnormality. PANCREAS: Pancreas demonstrates no acute abnormality. ADRENAL GLANDS: Adrenal glands demonstrate no acute abnormality. KIDNEYS, URETERS AND BLADDER: No stones in the kidneys or ureters. No hydronephrosis. No perinephric or periureteral stranding. Urinary bladder is unremarkable. GI AND BOWEL: Stomach and duodenal sweep demonstrate no acute abnormality. There is no bowel obstruction. No abnormal bowel wall thickening or distension. REPRODUCTIVE: Reproductive organs are unremarkable. PERITONEUM AND RETROPERITONEUM: No ascites or free air. LYMPH NODES: No lymphadenopathy. BONES  AND SOFT TISSUES: No acute abnormality of the visualized bones. No focal soft tissue abnormality. IMPRESSION: 1. No pulmonary embolism. 2. Slightly increased small pericardial effusion from 10/18/24. 3. Irregular right upper lobe pulmonary nodule, unchanged at 2.3 x 1.1 cm. 4. No acute abnormality in the abdomen or pelvis. Electronically signed by: Norman Gatlin MD 11/18/2024 03:39 AM EST RP Workstation: [id number]   DG Chest Portable 1 View Result Date: 11/18/2024 EXAM: 1 VIEW(S) XRAY OF THE CHEST 11/18/2024 01:31:54  AM COMPARISON: None available. CLINICAL HISTORY: eval for weakness FINDINGS: LINES, TUBES AND DEVICES: Left chest wall port-a-cath  tip in the mid SVC. LUNGS AND PLEURA: Left basilar atelectasis or infiltrates. No pleural effusion. No pneumothorax. HEART AND MEDIASTINUM: Stable cardiomediastinal silhouette. Aortic atherosclerotic calcification. BONES AND SOFT TISSUES: No acute osseous abnormality. IMPRESSION: 1. Left basilar atelectasis or infiltrates. Electronically signed by: Norman Gatlin MD 11/18/2024 01:35 AM EST RP Workstation: [id number]   CT HEAD WO CONTRAST Result Date: 11/18/2024 EXAM: CT HEAD WITHOUT CONTRAST 11/18/2024 01:26:34 AM TECHNIQUE: CT of the head was performed without the administration of intravenous contrast. Automated exposure control, iterative reconstruction, and/or weight based adjustment of the mA/kV was utilized to reduce the radiation dose to as low as reasonably achievable. COMPARISON: Comparison with 03/30/2018. CLINICAL HISTORY: Mental status change, unknown cause. FINDINGS: BRAIN AND VENTRICLES: No acute hemorrhage. No evidence of acute infarct. No hydrocephalus. No extra-axial collection. No mass effect or midline shift. ORBITS: No acute abnormality. SINUSES: No acute abnormality. SOFT TISSUES AND SKULL: No acute soft tissue abnormality. No skull fracture. IMPRESSION: 1. No acute intracranial abnormality. Electronically signed by: Norman Gatlin MD 11/18/2024 01:34 AM EST RP Workstation: [id number]    Microbiology: Results for orders placed or performed during the hospital encounter of 11/18/24  Culture, blood (Routine X 2) w Reflex to ID Panel     Status: None   Collection Time: 11/18/24  1:51 AM   Specimen: BLOOD RIGHT ARM  Result Value Ref Range Status   Specimen Description BLOOD RIGHT ARM  Final   Special Requests   Final    BOTTLES DRAWN AEROBIC AND ANAEROBIC Blood Culture adequate volume   Culture   Final    NO GROWTH 5 DAYS Performed at [facility], [address], [place], [place] [address]    Report Status 11/23/2024 FINAL  Final  Culture, blood (Routine X 2) w Reflex to ID Panel     Status: None   Collection Time: 11/18/24  1:58 AM   Specimen: BLOOD LEFT ARM  Result Value Ref Range Status   Specimen Description BLOOD LEFT ARM  Final   Special Requests   Final    BOTTLES DRAWN AEROBIC AND ANAEROBIC Blood Culture adequate volume   Culture   Final    NO GROWTH 5 DAYS Performed at [facility], [address] [place], [place] [address]    Report Status 11/23/2024 FINAL  Final  MRSA Next Gen by PCR, Nasal     Status: None   Collection Time: 11/18/24  8:11 AM   Specimen: Nasal Mucosa; Nasal Swab  Result Value Ref Range Status   MRSA by PCR Next Gen NOT DETECTED NOT DETECTED Final    Comment: (NOTE) The GeneXpert MRSA Assay (FDA approved for NASAL specimens only), is one component of a comprehensive MRSA colonization surveillance program. It is not intended to diagnose MRSA infection nor to guide or monitor treatment for MRSA infections. Test performance is not FDA approved in patients less than 51 years old. Performed  at [facility], [address], [place], [place] [address]     Labs: CBC: Recent Labs  Lab 11/18/24 0151 11/19/24 0520 11/20/24 0340 11/22/24 0408 11/23/24 0548  WBC 9.3 6.4 6.4 6.0 6.1  NEUTROABS 7.8*  --   --  4.6  --   HGB 9.7* 9.7* 9.4* 9.4* 9.8*  HCT 29.6* 30.0* 29.8* 29.0* 31.3*  MCV 94.9 97.4 97.7 96.7 96.9  PLT 351 304 307 296 278   Basic Metabolic Panel: Recent Labs  Lab 11/18/24 0151 11/19/24 0520 11/20/24 0340 11/22/24 0408 11/23/24 0548  NA 135 135 138 139 139  K 3.3* 3.5 3.5 2.7* 3.8  CL 94* 97* 101 100 99  CO2 26 27 28 29  35*  GLUCOSE 142* 94 95 180* 96  BUN 15 10 11 13 13   CREATININE 1.16 0.86 0.78 0.90  0.92 0.85  CALCIUM  8.9 8.7* 8.8* 8.3* 8.8*  MG  --  1.8  --  1.7  --    Liver Function Tests: Recent Labs  Lab 11/18/24 0151 11/23/24 0548  AST 29 229*  ALT 22  147*  ALKPHOS 112 506*  BILITOT 0.4 1.6*  PROT 7.3 7.2  ALBUMIN  3.3* 3.2*   CBG: Recent Labs  Lab 11/22/24 0748 11/22/24 1114 11/22/24 1614 11/22/24 2049 11/23/24 0724  GLUCAP 107* 103* 138* 131* 161*    Discharge time spent:  35 minutes.  Signed: Eric Nunnery, MD [facility] 11/23/2024

## 2024-11-23 NOTE — TOC Transition Note (Signed)
 Transition of Care [facility]) - Discharge Note   Patient Details  Name: Juan Merritt MRN: [id number] Date of Birth: 1951/04/03  Transition of Care [facility]) CM/SW Contact:  Hoy DELENA Bigness, LCSW Phone Number: 11/23/2024, 11:14 AM   Clinical Narrative:    Pt spouse agreeable to have HHPT arranged and does not have an agency preference. HHPT has been arranged with [facility]. HH order in place.    Final next level of care: Home w Home Health Services Barriers to Discharge: No Barriers Identified   Patient Goals and CMS Choice Patient states their goals for this hospitalization and ongoing recovery are:: to get better CMS Medicare.gov Compare Post Acute Care list provided to:: Patient Choice offered to / list presented to : Patient [place] ownership interest in [facility].provided to:: Patient    Discharge Placement                       Discharge Plan and Services Additional resources added to the After Visit Summary for                  DME Arranged: N/A DME Agency: NA       HH Arranged: PT HH Agency: [facility] Date [facility] Agency Contacted: 11/23/24 Time HH Agency Contacted: 1114 Representative spoke with at [facility] Agency: Darice  Social Drivers of Health (SDOH) Interventions SDOH Screenings   Food Insecurity: No Food Insecurity (11/18/2024)  Housing: Low Risk  (11/18/2024)  Transportation Needs: No Transportation Needs (11/18/2024)  Utilities: Not At Risk (11/18/2024)  Alcohol Screen: Low Risk  (08/22/2024)  Depression (PHQ2-9): Medium Risk (11/12/2024)  Financial Resource Strain: Low Risk  (08/22/2024)  Physical Activity: Inactive (08/22/2024)  Social Connections: Moderately Isolated (11/18/2024)  Stress: No Stress Concern Present (08/22/2024)  Tobacco Use: High Risk (11/18/2024)  Health Literacy: Adequate Health Literacy (08/22/2024)     Readmission Risk Interventions    11/19/2024   10:52 AM 10/03/2024    1:57 PM  Readmission Risk  Prevention Plan  Transportation Screening Complete Complete  PCP or Specialist Appt within 3-5 Days Not Complete   HRI or Home Care Consult Complete Complete  Social Work Consult for Recovery Care Planning/Counseling Complete Complete  Palliative Care Screening Not Applicable Not Applicable  Medication Review Oceanographer) Complete Complete

## 2024-11-25 ENCOUNTER — Telehealth: Payer: Self-pay | Admitting: *Deleted

## 2024-11-25 NOTE — Transitions of Care (Post Inpatient/ED Visit) (Signed)
 11/25/2024  Name: Juan Merritt MRN: [id number] DOB: 11/03/1951  Today's TOC FU Call Status: Today's TOC FU Call Status:: Successful TOC FU Call Completed TOC FU Call Complete Date: 11/25/24  Patient's Name and Date of Birth confirmed. Name, DOB  Transition Care Management Follow-up Telephone Call Date of Discharge: 11/23/24 [facility] Type of Discharge: Inpatient Admission Primary Inpatient Discharge Diagnosis:: Septic Shock How have you been since you were released from the hospital?: Better (eating fairly well, drinking well, no issues with bowel/ bladder) Any questions or concerns?: No  Items Reviewed: Did you receive and understand the discharge instructions provided?: Yes Any new allergies since your discharge?: No Dietary orders reviewed?: Yes Do you have support at home?: Yes People in Home [RPT]: spouse Name of Support/Comfort Primary Source: Norma Wayson  Medications Reviewed Today: Medications Reviewed Today     Reviewed by Aura Mliss LABOR, RN (Registered Nurse) on 11/25/24 at 1229  Med List Status: <None>   Medication Order Taking? Sig Documenting Provider Last Dose Status Informant  acetaminophen  (TYLENOL ) 325 MG tablet [id number] Yes Take 2 tablets (650 mg total) by mouth every 6 (six) hours as needed for mild pain (pain score 1-3) or fever (or Fever >/= 101). Pearlean Manus, MD  Active Self, Spouse/Significant Other, Pharmacy Records  acidophilus (RISAQUAD) CAPS capsule [id number] Yes Take 1 capsule by mouth daily at 6 (six) AM. Ricky Fines, MD  Active   albuterol  (VENTOLIN  HFA) 108 (90 Base) MCG/ACT inhaler [id number] Yes Inhale 2 puffs into the lungs every 4 (four) hours as needed for wheezing or shortness of breath. Pearlean Manus, MD  Active Self, Spouse/Significant Other, Pharmacy Records  allopurinol  (ZYLOPRIM ) 100 MG tablet [id number] Yes TAKE 1 TABLET (100 MG TOTAL) BY MOUTH DAILY. TO PREVENT GOUT Zollie Lowers, MD   Active Spouse/Significant Other, Pharmacy Records, Self  amoxicillin -clavulanate (AUGMENTIN ) 875-125 MG tablet [id number] Yes Take 1 tablet by mouth every 12 (twelve) hours for 3 days. Ricky Fines, MD  Active   apixaban  (ELIQUIS ) 5 MG TABS tablet [id number] Yes Take 1 tablet (5 mg total) by mouth 2 (two) times daily. Pearlean Manus, MD  Active Self, Spouse/Significant Other, Pharmacy Records  budesonide -formoterol  (SYMBICORT ) 160-4.5 MCG/ACT inhaler [id number] Yes Inhale 2 puffs into the lungs 2 (two) times daily. Pearlean Manus, MD  Active Self, Spouse/Significant Other, Pharmacy Records  busPIRone  (BUSPAR ) 7.5 MG tablet [id number] Yes Take 1 tablet (7.5 mg total) by mouth 2 (two) times daily. Ricky Fines, MD  Active   Continuous Glucose Sensor (FREESTYLE LIBRE 2 SENSOR) [place] [id number] Yes Apply 1 Units topically once a week. USE TO TEST BLOOD SUGAR CONTINUOUSLY. Dx. E11.65 SEND TO [facility] FOR MEDICARE COVERAGE, DO NOT SEND TO LOCAL PHARMACY Emokpae, Courage, MD  Active Self, Spouse/Significant Other, Pharmacy Records  cyanocobalamin  (VITAMIN B12) 1000 MCG tablet [id number] Yes Take 1 tablet (1,000 mcg total) by mouth daily. Pearlean Manus, MD  Active Self, Spouse/Significant Other, Pharmacy Records  folic acid  (FOLATE) 400 MCG tablet [id number] Yes Take 1 tablet (400 mcg total) by mouth daily. Pearlean Manus, MD  Active Self, Spouse/Significant Other, Pharmacy Records  gabapentin  (NEURONTIN ) 300 MG capsule [id number] Yes Take 1 capsule (300 mg total) by mouth 2 (two) times daily. Ricky Fines, MD  Active   ipratropium-albuterol  (DUONEB) 0.5-2.5 (3) MG/3ML SOLN [id number] Yes Take 3 mLs by nebulization every 6 (six) hours as needed. Pearlean Manus, MD  Active Self, Spouse/Significant Other, Pharmacy Records  lidocaine  (XYLOCAINE ) 2 % solution [id number]  Use as directed  15 mLs in the mouth or throat every 3 (three) hours as needed (throat pain, reflux). Will have to swallow due to  presence of yeast and pain in the esophagus  Patient not taking: Reported on 11/25/2024   Zollie Lowers, MD  Active Self, Spouse/Significant Other, Pharmacy Records  metFORMIN  (GLUCOPHAGE -XR) 500 MG 24 hr tablet [id number] Yes Take 1 tablet (500 mg total) by mouth daily with breakfast. Zollie Lowers, MD  Active Self, Spouse/Significant Other, Pharmacy Records  metoprolol  succinate (TOPROL -XL) 25 MG 24 hr tablet [id number] Yes Take 1 tablet (25 mg total) by mouth 2 (two) times daily. Ricky Fines, MD  Active   midodrine  (PROAMATINE ) 10 MG tablet [id number] Yes Take 1 tablet (10 mg total) by mouth 3 (three) times daily with meals. Ricky Fines, MD  Active   pantoprazole  (PROTONIX ) 40 MG tablet [id number] Yes Take 1 tablet (40 mg total) by mouth daily. Ricky Fines, MD  Active   potassium chloride  SA (KLOR-CON  M20) 20 MEQ tablet [id number] Yes Take 2 tablets (40 mEq total) by mouth 2 (two) times daily. Ricky Fines, MD  Active   tamsulosin  (FLOMAX ) 0.4 MG CAPS capsule [id number] Yes Take 1 capsule (0.4 mg total) by mouth daily after supper. Ricky Fines, MD  Active   thiamine  (VITAMIN B-1) 100 MG tablet [id number] Yes Take 1 tablet (100 mg total) by mouth daily. Pearlean Manus, MD  Active Self, Spouse/Significant Other, Pharmacy Records  torsemide  40 MG TABS [id number] Yes Take 40 mg by mouth daily. Ricky Fines, MD  Active   traMADol  (ULTRAM ) 50 MG tablet [id number] Yes Take 1 tablet (50 mg total) by mouth 4 (four) times daily as needed for moderate pain (pain score 4-6). For cancer related pain Zollie Lowers, MD  Active Self, Spouse/Significant Other, Pharmacy Records            Home Care and Equipment/Supplies: Were Home Health Services Ordered?: Yes Name of Home Health Agency:: [facility] Has Agency set up a time to come to your home?: No Any new equipment or medical supplies ordered?: No  Functional Questionnaire: Do you need assistance with bathing/showering or dressing?: No Do you  need assistance with meal preparation?: No Do you need assistance with eating?: No Do you have difficulty maintaining continence: No Do you need assistance with getting out of bed/getting out of a chair/moving?: No  Follow up appointments reviewed: PCP Follow-up appointment confirmed?: Yes Date of PCP follow-up appointment?: 12/10/24 Follow-up Provider: Dr. Zollie 12/23 Specialist Hospital Follow-up appointment confirmed?: Yes Date of Specialist follow-up appointment?: 12/04/24 Follow-Up Specialty Provider:: Laymon Qua   cardiology Do you need transportation to your follow-up appointment?: No Do you understand care options if your condition(s) worsen?: Yes-patient verbalized understanding  SDOH Interventions Today    Flowsheet Row Most Recent Value  SDOH Interventions   Food Insecurity Interventions Intervention Not Indicated  Housing Interventions Intervention Not Indicated  Transportation Interventions Intervention Not Indicated  Utilities Interventions Intervention Not Indicated    Goals Addressed             This Visit's Progress    VBCI Transitions of Care (TOC) Care Plan       Problems:  Recent Hospitalization for treatment of Septic Shock Pt has lung cancer, follows up with cancer center tomorrow 11/26/24, feels that weight loss is due to related treatment, pt has had 60 pounds weight loss in the past 8 months Pt weighs daily, checks BP daily, spouse is RN  Goal:  Over the next 30 days, the patient  will not experience hospital readmission  Interventions:  Evaluation of current treatment plan related to Septic Shock, self-management and patient's adherence to plan as established by provider. Discussed plans with patient for ongoing care management follow up and provided patient with direct contact information for care management team Evaluation of current treatment plan related to Septic Shock and patient's adherence to plan as established by provider Provided  education to patient re: Septic Shock signs/ symptoms, reportable signs /symptoms Reviewed medications with patient and discussed importance of taking as prescribed Collaborated with [facility]  regarding start of care as pt has not heard from home health, per [facility], orders are present, they will outreach pt today to schedule start of care Discussed plans with patient for ongoing care management follow up and provided patient with direct contact information for care management team Screening for signs and symptoms of depression related to chronic disease state  Assessed social determinant of health barriers Reviewed all upcoming scheduled appointments Reviewed signs /symptoms of infection RN CM provided home health contact # for pt/ spouse Encouraged pt to eat small, frequent meals, avoid spicy, greasy food, limit intake of mountain dew Reinforced with pt/ spouse to continue checking BP daily and keeping a log, continue daily weights  Patient Self Care Activities:  Attend all scheduled provider appointments Call pharmacy for medication refills 3-7 days in advance of running out of medications Call provider office for new concerns or questions  Notify RN Care Manager of TOC call rescheduling needs Participate in Transition of Care Program/Attend TOC scheduled calls Take medications as prescribed   Continue checking BP, weight daily and keeping a log, take log to provider appointments  Plan:  Telephone follow up appointment with care management team member scheduled for:  12/03/24 @ 930 am The patient has been provided with contact information for the care management team and has been advised to call with any health related questions or concerns.         Mliss Creed [facility], BSN RN Care Manager/ Transition of Care [place]/ [facility] [phone]

## 2024-11-26 ENCOUNTER — Telehealth: Payer: Self-pay | Admitting: *Deleted

## 2024-11-26 DIAGNOSIS — C3491 Malignant neoplasm of unspecified part of right bronchus or lung: Secondary | ICD-10-CM | POA: Diagnosis not present

## 2024-11-26 DIAGNOSIS — D539 Nutritional anemia, unspecified: Secondary | ICD-10-CM | POA: Diagnosis not present

## 2024-11-26 DIAGNOSIS — R7989 Other specified abnormal findings of blood chemistry: Secondary | ICD-10-CM | POA: Diagnosis not present

## 2024-11-26 NOTE — Patient Outreach (Signed)
 11/26/2024  Patient ID: Juan Merritt, male   DOB: 1951-05-30, 73 y.o.   MRN: [id number]  Patient's spouse called and left voicemail requesting return phone call,  RN CM called back and spoke with spouse/ DPR [name] who reports patient weighed today but did not empty bladder first and did not weigh the same time as yesterday, weight today 228 pounds, reports pt saw doctor at cancer center but they were not concerned,  spouse feels due to pt not being consistent with time of day, etc for weighing, the weight today may not be accurate as compared to baseline weight.  Spouse states pt will weigh tomorrow in the morning on hard surface after emptying bladder, before eating or dressing, if weight is up as per HF action plan parameters, pt / spouse will contact cardiologist. RN CM reinforced HF action plan.  Mliss Creed [facility], BSN RN Care Manager/ Transition of Care [place]/ [facility] [phone]

## 2024-11-27 ENCOUNTER — Ambulatory Visit: Admitting: Family Medicine

## 2024-11-27 ENCOUNTER — Ambulatory Visit

## 2024-11-27 ENCOUNTER — Encounter: Payer: Self-pay | Admitting: Family Medicine

## 2024-11-27 VITALS — BP 105/67 | HR 94 | Temp 97.5°F | Ht 70.5 in | Wt 230.0 lb

## 2024-11-27 DIAGNOSIS — J449 Chronic obstructive pulmonary disease, unspecified: Secondary | ICD-10-CM | POA: Diagnosis not present

## 2024-11-27 DIAGNOSIS — R0600 Dyspnea, unspecified: Secondary | ICD-10-CM

## 2024-11-27 DIAGNOSIS — E1140 Type 2 diabetes mellitus with diabetic neuropathy, unspecified: Secondary | ICD-10-CM

## 2024-11-27 DIAGNOSIS — E861 Hypovolemia: Secondary | ICD-10-CM

## 2024-11-27 DIAGNOSIS — R059 Cough, unspecified: Secondary | ICD-10-CM | POA: Diagnosis not present

## 2024-11-27 DIAGNOSIS — A419 Sepsis, unspecified organism: Secondary | ICD-10-CM

## 2024-11-27 DIAGNOSIS — R918 Other nonspecific abnormal finding of lung field: Secondary | ICD-10-CM | POA: Diagnosis not present

## 2024-11-27 DIAGNOSIS — Z794 Long term (current) use of insulin: Secondary | ICD-10-CM | POA: Diagnosis not present

## 2024-11-27 DIAGNOSIS — R6521 Severe sepsis with septic shock: Secondary | ICD-10-CM | POA: Diagnosis not present

## 2024-11-27 NOTE — Progress Notes (Signed)
 Subjective:  Patient ID: Juan Merritt, male    DOB: Nov 04, 1951  Age: 73 y.o. MRN: [id number]  CC: Hospitalization Follow-up (Pt states that he fell out from pneumonia and was in hospital for 6 days. )   HPI  Discussed the use of AI scribe software for clinical note transcription with the patient, who gave verbal consent to proceed.  History of Present Illness Juan Merritt is a 73 year old male who presents for follow-up after hospitalization for pneumonia and syncope.  He experienced a syncopal episode on November 30th, feeling unwell and propping himself against a chair before losing consciousness. His wife assisted him, and he was transported to the hospital where he was admitted to the ICU. During his hospital stay, he experienced another episode of tachycardia with a heart rate of 195-200 bpm, requiring treatment. He was moved back to the ICU and later discharged after stabilization. Since returning home, he feels stronger each day and is able to move around more easily. No further episodes of syncope or significant weakness have occurred since discharge.  During his hospital stay, he was diagnosed with pneumonia. A CT scan ruled out pulmonary embolism but showed some fluid around the heart. He reports improved strength and no shortness of breath. His appetite is not fully back to normal, and he has been drinking a lot of water , which he attributes to weight fluctuations.  He is currently taking a diuretic, although he notes a change in the specific medication. He is also on midodrine  or primidone to prevent his blood pressure from dropping too low, which was a contributing factor to his syncope. His blood sugar levels have been stable since discharge, and he is on a reduced dose of metformin  (500 mg). He had previously lost weight but has since regained it, likely due to fluid balance changes.  No shortness of breath or significant swelling. Appetite is not fully back to normal,  and he has been drinking a lot of water .          11/27/2024    3:44 PM 11/25/2024   12:25 PM 11/12/2024    8:38 AM  Depression screen PHQ 2/9  Decreased Interest 0 0 2  Down, Depressed, Hopeless 0 0 1  PHQ - 2 Score 0 0 3  Altered sleeping 0  2  Tired, decreased energy 0  1  Change in appetite 0  1  Feeling bad or failure about yourself  0  0  Trouble concentrating 0  0  Moving slowly or fidgety/restless 0  2  Suicidal thoughts 0  0  PHQ-9 Score 0  9  Difficult doing work/chores Not difficult at all  Somewhat difficult    History Gary has a past medical history of Arthritis, BPH associated with nocturia, Cancer (HCC), Chronic gout, COPD (chronic obstructive pulmonary disease) (HCC), History of colon polyps, History of COVID-19, Hyperlipidemia, mixed, Hypertension, Phimosis, and Type 2 diabetes mellitus (HCC).   He has a past surgical history that includes Laparoscopic cholecystectomy (2008); Tonsillectomy; Colonoscopy (12/01/2021); Circumcision (N/A, 05/19/2022); Endobronchial ultrasound (01/22/2024); Bronchial needle aspiration biopsy (01/22/2024); Bronchial biopsy (01/22/2024); Bronchial brushings (01/22/2024); Video bronchoscopy with radial endobronchial ultrasound (01/22/2024); Fine needle aspiration (01/22/2024); Left heart cath; Esophagogastroduodenoscopy (N/A, 10/04/2024); and Esophageal dilation (N/A, 10/04/2024).   His family history includes Asthma in his mother; Diabetes in his father; Esophageal cancer in his paternal uncle; Heart attack in an other family member; Heart attack (age of onset: 46) in his father; Leukemia in his brother;  Lung disease in an other family member.He reports that he quit smoking about 7 years ago. His smoking use included cigarettes. He started smoking about 47 years ago. He has a 10 pack-year smoking history. His smokeless tobacco use includes chew. He reports that he does not currently use alcohol after a past usage of about 24.0 standard  drinks of alcohol per week. He reports that he does not use drugs.    ROS Review of Systems  Constitutional: Negative.   HENT: Negative.    Eyes:  Negative for visual disturbance.  Respiratory:  Negative for cough and shortness of breath.   Cardiovascular:  Negative for chest pain and leg swelling.  Gastrointestinal:  Negative for abdominal pain, diarrhea, nausea and vomiting.  Genitourinary:  Negative for difficulty urinating.  Musculoskeletal:  Negative for arthralgias and myalgias.  Skin:  Negative for rash.  Neurological:  Negative for headaches.  Psychiatric/Behavioral:  Negative for sleep disturbance.     Objective:  BP 105/67   Pulse 94   Temp (!) 97.5 F (36.4 C)   Ht 5' 10.5 (1.791 m)   Wt 230 lb (104.3 kg)   SpO2 95%   BMI 32.54 kg/m   BP Readings from Last 3 Encounters:  11/27/24 105/67  11/25/24 102/67  11/23/24 115/76    Wt Readings from Last 3 Encounters:  11/27/24 230 lb (104.3 kg)  11/25/24 224 lb (101.6 kg)  11/23/24 221 lb 1.9 oz (100.3 kg)     Physical Exam Physical Exam MEASUREMENTS: Weight- 230. GENERAL: Alert, cooperative, well developed, no acute distress HEENT: Normocephalic, normal oropharynx, moist mucous membranes CHEST: Clear to auscultation bilaterally, no wheezes, rhonchi, or crackles, lungs normal CARDIOVASCULAR: Normal heart rate and rhythm, S1 and S2 normal without murmurs ABDOMEN: Soft, non-tender, non-distended, without organomegaly, normal bowel sounds EXTREMITIES: No cyanosis or edema NEUROLOGICAL: Cranial nerves grossly intact, moves all extremities without gross motor or sensory deficit   Assessment & Plan:  Type 2 diabetes mellitus with diabetic neuropathy, with long-term current use of insulin  (HCC) -     CBC with Differential/Platelet -     CMP14+EGFR -     DG Chest 2 View; Future  Hypotension due to hypovolemia -     CBC with Differential/Platelet -     CMP14+EGFR -     DG Chest 2 View; Future  Septic shock  (HCC) -     CBC with Differential/Platelet -     CMP14+EGFR -     DG Chest 2 View; Future    Assessment and Plan Assessment & Plan Hypotension due to hypovolemia   He experienced a recent episode of hypotension leading to syncope, likely due to hypovolemia. Blood pressure was low upon hospital admission, and he was treated with midodrine  to prevent further episodes. He reports feeling better with improved strength and no further syncope. Continue midodrine  to manage blood pressure and prevent syncope.  Pneumonia   He was recently hospitalized for pneumonia and currently shows no symptoms of respiratory distress or infection. A CT scan showed no pulmonary embolism, but some pericardial effusion was noted. A chest x-ray has been ordered to assess current lung status, along with basic blood work to monitor overall health.  Type 2 diabetes mellitus with diabetic neuropathy   Diabetes management was adjusted with a reduction in metformin  dosage to 500 mg. Blood sugar levels have been stable since returning home. Recent weight gain is noted, likely due to fluid retention rather than glycemic control issues. Continue current metformin  dosage  of 500 mg and monitor blood sugar levels regularly.  Fluid overload   Recent weight gain is likely due to fluid retention, possibly related to recent illness and hospitalization. He is currently on a diuretic to manage fluid balance and reports drinking a large amount of water  daily, which may contribute to fluid status. Continue current diuretic therapy and monitor weight and fluid status.       Follow-up: Return in about 3 months (around 02/25/2025).  Butler Der, M.D.

## 2024-11-28 ENCOUNTER — Telehealth: Payer: Self-pay

## 2024-11-28 DIAGNOSIS — I959 Hypotension, unspecified: Secondary | ICD-10-CM

## 2024-11-28 NOTE — Progress Notes (Signed)
 Complex Care Management Note Care Guide Note  11/28/2024 Name: Juan Merritt MRN: [id number] DOB: 25-Jul-1951   Complex Care Management Outreach Attempts: An unsuccessful telephone outreach was attempted today to offer the patient information about available complex care management services.  Follow Up Plan:  Additional outreach attempts will be made to offer the patient complex care management information and services.   Encounter Outcome:  No Answer  Dreama [facility]  [facility], [facility] VBCI Assistant Direct Dial: [phone]  Fax: [phone]

## 2024-11-28 NOTE — Progress Notes (Signed)
 Complex Care Management Note  Care Guide Note 11/28/2024 Name: Juan Merritt MRN: [id number] DOB: [birth year]  Juan Merritt is a 73 y.o. year old male who sees Stacks, Butler, MD for primary care. I reached out to Truitt D Rydberg by phone today to offer complex care management services.  [name] was given information about Complex Care Management services today including:   The Complex Care Management services include support from the care team which includes your Nurse Care Manager, Clinical Social Worker, or Pharmacist.  The Complex Care Management team is here to help remove barriers to the health concerns and goals most important to you. Complex Care Management services are voluntary, and the patient may decline or stop services at any time by request to their care team member.   Complex Care Management Consent Status: Patient did not agree to participate in complex care management services at this time.  Follow up plan:  Spouse will follow up with PCP.  Encounter Outcome:  Patient Refused  Dreama Agent [facility], [facility] VBCI Assistant Direct Dial: [phone]  Fax: [phone]

## 2024-11-30 ENCOUNTER — Encounter: Payer: Self-pay | Admitting: Family Medicine

## 2024-12-02 NOTE — Progress Notes (Unsigned)
 Cardiology Office Note    Date:  12/04/2024  ID:  Juan Merritt, DOB [birth year], MRN [id number] Cardiologist: Vina Gull, MD Cardiology APP:  Johnson Laymon HERO, PA-C { :  History of Present Illness:    Juan Merritt is a 73 y.o. male with past medical history of lower extremity edema, Stage III Squamous Cell Lung cancer (s/p radiation and chemotherapy and now receiving immunotherapy), coronary calcification by CT, COPD, BPH, HTN, HLD, history of DVT and GERD who presents to the office today for hospital follow-up.  He was most recently admitted to [facility] earlier this month for septic shock in the setting of bronchitis. Initially required pressor support and received a significant amount of fluids. Cardiology was consulted as he developed SVT during admission and converted back to normal sinus rhythm with Adenosine . The arrhythmia was felt to be exacerbated by his acute illness at the time. Echocardiogram showed a preserved EF of 55 to 60% with no regional wall motion abnormalities, normal RV function and trivial MR. He did have mild dilatation of the aortic root at 41 mm. He was continued on Toprol -XL 25 mg twice daily and was also discharged on Midodrine  10 mg 3 times daily given hypotension during admission.  In talking with the patient and his wife today, he reports not being very active at baseline as he is typically in the house and does not go to the grocery store or other places routinely. Reports his breathing has been stable and denies any specific orthopnea or PND. No recent lower extremity edema. He was previously on Torsemide  100 mg twice daily earlier this year but has lost over 50 pounds in the setting of cancer treatments. Has been taking Torsemide  40 mg daily but reports still having episodic dizziness with positional changes. Says blood pressure has been soft at home with SBP typically in the 90's to low 100's. His wife mentions that his baseline heart rate has been in  the 100's since completing radiation and chemotherapy.  Studies Reviewed:   EKG: EKG is not ordered today.  Echocardiogram: 11/2024 IMPRESSIONS     1. Left ventricular ejection fraction, by estimation, is 55 to 60%. The  left ventricle has normal function. The left ventricle has no regional  wall motion abnormalities. The left ventricular internal cavity size was  mildly dilated. Left ventricular  diastolic parameters were normal.   2. Right ventricular systolic function is normal. The right ventricular  size is normal.   3. Right atrial size was mildly dilated.   4. Trivial mitral valve regurgitation.   5. The aortic valve is tricuspid. Aortic valve regurgitation is not  visualized. Aortic valve sclerosis/calcification is present, without any  evidence of aortic stenosis.   6. Aortic dilatation noted. There is mild dilatation of the aortic root,  measuring 41 mm.   7. The inferior vena cava is dilated in size with >50% respiratory  variability, suggesting right atrial pressure of 8 mmHg.    Physical Exam:   VS:  BP (!) 100/52 (BP Location: Right Arm, Cuff Size: Normal)   Pulse (!) 104   Ht 5' 10.5 (1.791 m)   Wt 225 lb 6.4 oz (102.2 kg)   SpO2 93%   BMI 31.88 kg/m    Wt Readings from Last 3 Encounters:  12/04/24 225 lb 6.4 oz (102.2 kg)  12/03/24 219 lb (99.3 kg)  11/27/24 230 lb (104.3 kg)     GEN: Well nourished, well developed male appearing in no acute  distress NECK: No JVD; No carotid bruits CARDIAC: RRR, no murmurs, rubs, gallops RESPIRATORY:  Clear to auscultation without rales, wheezing or rhonchi  ABDOMEN: Appears non-distended. No obvious abdominal masses. EXTREMITIES: No clubbing or cyanosis. No pitting edema.  Distal pedal pulses are 2+ bilaterally.   Assessment and Plan:   1. SVT (supraventricular tachycardia) - His heart rate has overall been well-controlled when checked at home and is at his prior baseline of the 90's to low 100's. Improved into  the 90's today after resting for a brief period. Continue current medical therapy with Toprol -XL 25 mg twice daily.  2. Coronary artery calcification seen on CT scan - Noted on prior imaging and he did have a Lexiscan  Myoview  in 07/2020 which showed no evidence of ischemia and was a low-risk study. Recent echocardiogram showed a preserved EF with no regional wall motion abnormalities. He denies any recent anginal symptoms. - Continue with risk factor modification. He was previously intolerant to statin therapy due to myalgias. LDL was well-controlled at 59 when checked earlier this month. Would continue with dietary modification at this time. If LDL were to trend up over time, could start Zetia 10 mg daily.  3. Orthostatic hypotension - He is still having occasional dizziness with positional changes but symptoms have improved. Suspect that his significant change in blood pressure as compared to earlier this year is due to his weight loss while undergoing cancer treatment. For now, will continue Midodrine  10 mg 3 times daily. Will reduce Torsemide  dosing as discussed below. Provided them with a BP log and was encouraged to return this in 1 to 2 months.  4. Bilateral lower extremity edema - Symptoms have overall been well-controlled and he actually appears dry on examination today. Lungs are clear on examination. He has been taking Torsemide  40 mg daily will reduce to 20 mg daily.  5. History of DVT - He has remained on Eliquis  5 mg twice daily for anticoagulation per Hematology/Oncology. He reports always feeling cold but no reports of active bleeding. CBC on 11/26/2024 showed his hemoglobin was stable at 9.3 and platelets at 256K.  Signed, Laymon CHRISTELLA Qua, PA-C

## 2024-12-03 ENCOUNTER — Other Ambulatory Visit: Payer: Self-pay | Admitting: *Deleted

## 2024-12-03 ENCOUNTER — Telehealth: Payer: Self-pay | Admitting: Pharmacist

## 2024-12-03 DIAGNOSIS — G720 Drug-induced myopathy: Secondary | ICD-10-CM | POA: Insufficient documentation

## 2024-12-03 NOTE — Patient Outreach (Signed)
 Transition of Care week 2  Visit Note  12/03/2024  Name: Juan Merritt MRN: [id number]          DOB: [birth year]  Situation: Patient enrolled in [facility] 30-day program. Visit completed with patient by telephone.   Background:  Discharge Date and Diagnosis: 11/23/24, Septic Shock   Past Medical History:  Diagnosis Date   Arthritis    BPH associated with nocturia    Cancer (HCC)    stage III non-small cell lung cancer.   Chronic gout    COPD (chronic obstructive pulmonary disease) (HCC)    not on home o2   History of colon polyps    History of COVID-19    04/ 2019 hospital admission in epic, severe sepsis due to covid w/ acute respiratory failure;   and 05/ 2021 mild to moderate symptoms, recovered at home   Hyperlipidemia, mixed    Hypertension    Phimosis    Type 2 diabetes mellitus (HCC)    followed by pcp   (05-17-2022  pt stated checks sugar multiple times daily w/ Libre,  fasting average --- 100-110)    Assessment: Patient Reported Symptoms: Cognitive Cognitive Status: No symptoms reported, Alert and oriented to person, place, and time, Able to follow simple commands, Normal speech and language skills      Neurological Neurological Review of Symptoms: No symptoms reported    HEENT HEENT Symptoms Reported: No symptoms reported      Cardiovascular Cardiovascular Symptoms Reported: Swelling in legs or feet (wearing compression hose, pt reports chronic LE edema) Cardiovascular Management Strategies: Adequate rest, Routine screening, Medication therapy Weight: 219 lb (99.3 kg) Cardiovascular Self-Management Outcome: 4 (good) Cardiovascular Comment: reinforced HF action plan  Respiratory Respiratory Symptoms Reported: No symptoms reported    Endocrine Endocrine Symptoms Reported: No symptoms reported Is patient diabetic?: Yes Is patient checking blood sugars at home?: Yes List most recent blood sugar readings, include date and time of day: Freestyle Libre   FBS today  106 Endocrine Self-Management Outcome: 4 (good) Endocrine Comment: reinforced carbohydrate modified diet  Gastrointestinal Gastrointestinal Symptoms Reported: No symptoms reported Gastrointestinal Management Strategies: Adequate rest Gastrointestinal Self-Management Outcome: 3 (uncertain)    Genitourinary Genitourinary Symptoms Reported: No symptoms reported    Integumentary Integumentary Symptoms Reported: No symptoms reported    Musculoskeletal Musculoskelatal Symptoms Reviewed: No symptoms reported        Psychosocial Psychosocial Symptoms Reported: No symptoms reported         Today's Vitals   12/03/24 0945  BP: 116/72  Weight: 219 lb (99.3 kg)   Pain Scale: 0-10 Pain Score: 0-No pain  Medications Reviewed Today     Reviewed by Aura Mliss LABOR, RN (Registered Nurse) on 12/03/24 at [phone]  Med List Status: <None>   Medication Order Taking? Sig Documenting Provider Last Dose Status Informant  acetaminophen  (TYLENOL ) 325 MG tablet [id number] Yes Take 2 tablets (650 mg total) by mouth every 6 (six) hours as needed for mild pain (pain score 1-3) or fever (or Fever >/= 101). Pearlean Manus, MD  Active Self, Spouse/Significant Other, Pharmacy Records  acidophilus (RISAQUAD) CAPS capsule [id number] Yes Take 1 capsule by mouth daily at 6 (six) AM. Ricky Fines, MD  Active   albuterol  (VENTOLIN  HFA) 108 (90 Base) MCG/ACT inhaler [id number] Yes Inhale 2 puffs into the lungs every 4 (four) hours as needed for wheezing or shortness of breath. Pearlean Manus, MD  Active Self, Spouse/Significant Other, Pharmacy Records  allopurinol  (ZYLOPRIM ) 100 MG tablet [id number] Yes TAKE 1  TABLET (100 MG TOTAL) BY MOUTH DAILY. TO PREVENT GOUT Zollie Lowers, MD  Active Spouse/Significant Other, Pharmacy Records, Self  apixaban  (ELIQUIS ) 5 MG TABS tablet [id number] Yes Take 1 tablet (5 mg total) by mouth 2 (two) times daily. Pearlean Manus, MD  Active Self, Spouse/Significant Other, Pharmacy Records   budesonide -formoterol  (SYMBICORT ) 160-4.5 MCG/ACT inhaler [id number] Yes Inhale 2 puffs into the lungs 2 (two) times daily. Pearlean Manus, MD  Active Self, Spouse/Significant Other, Pharmacy Records  busPIRone  (BUSPAR ) 7.5 MG tablet [id number] Yes Take 1 tablet (7.5 mg total) by mouth 2 (two) times daily. Ricky Fines, MD  Active   Continuous Glucose Sensor (FREESTYLE LIBRE 2 SENSOR) [place] [id number] Yes Apply 1 Units topically once a week. USE TO TEST BLOOD SUGAR CONTINUOUSLY. Dx. E11.65 SEND TO [facility] FOR MEDICARE COVERAGE, DO NOT SEND TO LOCAL PHARMACY Emokpae, Courage, MD  Active Self, Spouse/Significant Other, Pharmacy Records  cyanocobalamin  (VITAMIN B12) 1000 MCG tablet [id number] Yes Take 1 tablet (1,000 mcg total) by mouth daily. Pearlean Manus, MD  Active Self, Spouse/Significant Other, Pharmacy Records  folic acid  (FOLATE) 400 MCG tablet [id number] Yes Take 1 tablet (400 mcg total) by mouth daily. Pearlean Manus, MD  Active Self, Spouse/Significant Other, Pharmacy Records  gabapentin  (NEURONTIN ) 300 MG capsule [id number] Yes Take 1 capsule (300 mg total) by mouth 2 (two) times daily. Ricky Fines, MD  Active   ipratropium-albuterol  (DUONEB) 0.5-2.5 (3) MG/3ML SOLN [id number] Yes Take 3 mLs by nebulization every 6 (six) hours as needed. Pearlean Manus, MD  Active Self, Spouse/Significant Other, Pharmacy Records  lidocaine  (XYLOCAINE ) 2 % solution [id number] Yes Use as directed 15 mLs in the mouth or throat every 3 (three) hours as needed (throat pain, reflux). Will have to swallow due to presence of yeast and pain in the esophagus Zollie Lowers, MD  Active Self, Spouse/Significant Other, Pharmacy Records  metFORMIN  (GLUCOPHAGE -XR) 500 MG 24 hr tablet [id number] Yes Take 1 tablet (500 mg total) by mouth daily with breakfast. Zollie Lowers, MD  Active Self, Spouse/Significant Other, Pharmacy Records  metoprolol  succinate (TOPROL -XL) 25 MG 24 hr tablet [id number] Yes Take 1 tablet (25  mg total) by mouth 2 (two) times daily. Ricky Fines, MD  Active   midodrine  (PROAMATINE ) 10 MG tablet [id number] Yes Take 1 tablet (10 mg total) by mouth 3 (three) times daily with meals. Ricky Fines, MD  Active   pantoprazole  (PROTONIX ) 40 MG tablet [id number] Yes Take 1 tablet (40 mg total) by mouth daily. Ricky Fines, MD  Active   potassium chloride  SA (KLOR-CON  M20) 20 MEQ tablet [id number] Yes Take 2 tablets (40 mEq total) by mouth 2 (two) times daily. Ricky Fines, MD  Active   tamsulosin  (FLOMAX ) 0.4 MG CAPS capsule [id number] Yes Take 1 capsule (0.4 mg total) by mouth daily after supper. Ricky Fines, MD  Active   thiamine  (VITAMIN B-1) 100 MG tablet [id number] Yes Take 1 tablet (100 mg total) by mouth daily. Pearlean Manus, MD  Active Self, Spouse/Significant Other, Pharmacy Records  torsemide  40 MG TABS [id number] Yes Take 40 mg by mouth daily. Ricky Fines, MD  Active   traMADol  (ULTRAM ) 50 MG tablet [id number] Yes Take 1 tablet (50 mg total) by mouth 4 (four) times daily as needed for moderate pain (pain score 4-6). For cancer related pain Zollie Lowers, MD  Active Self, Spouse/Significant Other, Pharmacy Records            Goals Addressed             This  Visit's Progress    VBCI Transitions of Care (TOC) Care Plan       Problems:  Recent Hospitalization for treatment of Septic Shock Pt has lung cancer, follows up with cancer, feels that weight loss is due to related treatment, pt has had 60 pounds weight loss in the past 8 months Pt weighs daily, checks BP daily, spouse is RN, no new concerns reported 12/16, pt states he told home health not to come out  Goal:  Over the next 30 days, the patient will not experience hospital readmission  Interventions:  Evaluation of current treatment plan related to Septic Shock, self-management and patient's adherence to plan as established by provider. Discussed plans with patient for ongoing care management follow up and  provided patient with direct contact information for care management team Evaluation of current treatment plan related to Septic Shock and patient's adherence to plan as established by provider Provided education to patient re: Septic Shock signs/ symptoms, reportable signs /symptoms Discussed plans with patient for ongoing care management follow up and provided patient with direct contact information for care management team Reviewed all upcoming scheduled appointments Reviewed signs /symptoms of infection Encouraged pt to eat small, frequent meals, avoid spicy, greasy food, limit intake of mountain dew Reinforced with pt/ spouse to continue checking BP daily and keeping a log, continue daily weights Reviewed HF action plan  Patient Self Care Activities:  Attend all scheduled provider appointments Call pharmacy for medication refills 3-7 days in advance of running out of medications Call provider office for new concerns or questions  Notify RN Care Manager of TOC call rescheduling needs Participate in Transition of Care Program/Attend TOC scheduled calls Take medications as prescribed   Follow Heart Failure action plan as discussed Continue checking BP, weight daily and keeping a log, take log to provider appointments  Plan: Telephone outreach 12/16/24 @ 915 am         Recommendation:   PCP Follow-up  Follow Up Plan:   Telephone follow-up 12/16/24 @ 915 am,  will not outreach week of 12/22 (Christmas holiday) pt will be seeing primary care provider on 12/23, will outreach week of 12/29  Mliss Creed [facility], BSN RN Care Manager/ Transition of Care [place]/ [facility] [phone]

## 2024-12-03 NOTE — Telephone Encounter (Signed)
° °  This patient is appearing on a report for being at risk of failing the adherence measure for  SUPD cholesterol (statin) medications this calendar year.   Unable to tolerate statins G72 added to problem list today Reminder to code G72 placed in cardiology visit for 12/04/24 Message sent to PCP for 12/23 visit   Will f/u next week  Mliss Tarry Griffin, PharmD, BCACP, CPP Clinical Pharmacist, [facility]

## 2024-12-03 NOTE — Patient Instructions (Signed)
 Visit Information  Thank you for taking time to visit with me today. Please don't hesitate to contact me if I can be of assistance to you before our next scheduled telephone appointment.  Our next appointment is by telephone on 12/16/24 @ 915 am  Following is a copy of your care plan:   Goals Addressed             This Visit's Progress    VBCI Transitions of Care (TOC) Care Plan       Problems:  Recent Hospitalization for treatment of Septic Shock Pt has lung cancer, follows up with cancer, feels that weight loss is due to related treatment, pt has had 60 pounds weight loss in the past 8 months Pt weighs daily, checks BP daily, spouse is RN, no new concerns reported 12/16, pt states he told home health not to come out  Goal:  Over the next 30 days, the patient will not experience hospital readmission  Interventions:  Evaluation of current treatment plan related to Septic Shock, self-management and patient's adherence to plan as established by provider. Discussed plans with patient for ongoing care management follow up and provided patient with direct contact information for care management team Evaluation of current treatment plan related to Septic Shock and patient's adherence to plan as established by provider Provided education to patient re: Septic Shock signs/ symptoms, reportable signs /symptoms Discussed plans with patient for ongoing care management follow up and provided patient with direct contact information for care management team Reviewed all upcoming scheduled appointments Reviewed signs /symptoms of infection Encouraged pt to eat small, frequent meals, avoid spicy, greasy food, limit intake of mountain dew Reinforced with pt/ spouse to continue checking BP daily and keeping a log, continue daily weights Reviewed HF action plan  Patient Self Care Activities:  Attend all scheduled provider appointments Call pharmacy for medication refills 3-7 days in advance of  running out of medications Call provider office for new concerns or questions  Notify RN Care Manager of TOC call rescheduling needs Participate in Transition of Care Program/Attend TOC scheduled calls Take medications as prescribed   Follow Heart Failure action plan as discussed Continue checking BP, weight daily and keeping a log, take log to provider appointments  Plan: Telephone outreach 12/16/24 @ 915 am        Care plan and visit instructions communicated with the patient verbally today. Patient agrees to receive a copy in MyChart. Active MyChart status and patient understanding of how to access instructions and care plan via MyChart confirmed with patient.     Telephone follow up appointment with care management team member scheduled for:  12/16/24 @ 915 am  Please call the care guide team at [phone] if you need to cancel or reschedule your appointment.   Please call the Suicide and Crisis Lifeline: 988 call the [place]  National Suicide Prevention Lifeline: 620-662-4976 or TTY: 713-143-0814 TTY 863-754-2312) to talk to a trained counselor call 1-800-273-TALK (toll free, 24 hour hotline) go to [facility] [address] [place] [phone]) call the [facility] Crisis Line: 413-556-5119 call 911 if you are experiencing a Mental Health or Behavioral Health Crisis or need someone to talk to.  Mliss Creed [facility], BSN RN Care Manager/ Transition of Care [place]/ [facility] [phone]

## 2024-12-04 ENCOUNTER — Encounter: Payer: Self-pay | Admitting: Student

## 2024-12-04 ENCOUNTER — Ambulatory Visit: Attending: Student | Admitting: Student

## 2024-12-04 VITALS — BP 100/52 | HR 104 | Ht 70.5 in | Wt 225.4 lb

## 2024-12-04 DIAGNOSIS — I2510 Atherosclerotic heart disease of native coronary artery without angina pectoris: Secondary | ICD-10-CM | POA: Diagnosis not present

## 2024-12-04 DIAGNOSIS — I471 Supraventricular tachycardia, unspecified: Secondary | ICD-10-CM

## 2024-12-04 DIAGNOSIS — T466X5D Adverse effect of antihyperlipidemic and antiarteriosclerotic drugs, subsequent encounter: Secondary | ICD-10-CM | POA: Diagnosis not present

## 2024-12-04 DIAGNOSIS — I951 Orthostatic hypotension: Secondary | ICD-10-CM

## 2024-12-04 DIAGNOSIS — R600 Localized edema: Secondary | ICD-10-CM | POA: Diagnosis not present

## 2024-12-04 DIAGNOSIS — Z86718 Personal history of other venous thrombosis and embolism: Secondary | ICD-10-CM

## 2024-12-04 DIAGNOSIS — G720 Drug-induced myopathy: Secondary | ICD-10-CM

## 2024-12-04 MED ORDER — TORSEMIDE 20 MG PO TABS: 20.0000 mg | ORAL_TABLET | Freq: Every day | ORAL | 3 refills | Status: AC

## 2024-12-04 MED ORDER — METOPROLOL SUCCINATE ER 25 MG PO TB24: 25.0000 mg | ORAL_TABLET | Freq: Two times a day (BID) | ORAL | 2 refills | Status: AC

## 2024-12-04 NOTE — Patient Instructions (Signed)
 Medication Instructions:   Decrease Torsemide  to 20 mg Daily   Monitor blood pressure for one month and drop off readings or send via MyChart.   *If you need a refill on your cardiac medications before your next appointment, please call your pharmacy*  Lab Work: NONE   If you have labs (blood work) drawn today and your tests are completely normal, you will receive your results only by: MyChart Message (if you have MyChart) OR A paper copy in the mail If you have any lab test that is abnormal or we need to change your treatment, we will call you to review the results.  Testing/Procedures: NONE   Follow-Up: At [facility], you and your health needs are our priority.  As part of our continuing mission to provide you with exceptional heart care, our providers are all part of one team.  This team includes your primary Cardiologist (physician) and Advanced Practice Providers or APPs (Physician Assistants and Nurse Practitioners) who all work together to provide you with the care you need, when you need it.  Your next appointment:   4 -5 month(s)  Provider:   You may see Vina Gull, MD or one of the following Advanced Practice Providers on your designated Care Team:   Laymon Qua, PA-C  [place], [place] Olivia Pavy, [place]     We recommend signing up for the patient portal called MyChart.  Sign up information is provided on this After Visit Summary.  MyChart is used to connect with patients for Virtual Visits (Telemedicine).  Patients are able to view lab/test results, encounter notes, upcoming appointments, etc.  Non-urgent messages can be sent to your provider as well.   To learn more about what you can do with MyChart, go to [email or web address].   Other Instructions Thank you for choosing [facility]!

## 2024-12-06 DIAGNOSIS — C799 Secondary malignant neoplasm of unspecified site: Secondary | ICD-10-CM | POA: Diagnosis not present

## 2024-12-06 DIAGNOSIS — C3491 Malignant neoplasm of unspecified part of right bronchus or lung: Secondary | ICD-10-CM | POA: Diagnosis not present

## 2024-12-09 DIAGNOSIS — Z5112 Encounter for antineoplastic immunotherapy: Secondary | ICD-10-CM | POA: Diagnosis not present

## 2024-12-09 DIAGNOSIS — C3411 Malignant neoplasm of upper lobe, right bronchus or lung: Secondary | ICD-10-CM | POA: Diagnosis not present

## 2024-12-09 DIAGNOSIS — C799 Secondary malignant neoplasm of unspecified site: Secondary | ICD-10-CM | POA: Diagnosis not present

## 2024-12-09 NOTE — Progress Notes (Signed)
Patient arrived to clinic ambulatory.  Vital signs obtained.  Port accessed per protocol with positive blood return.  Hydration and premedication given as ordered.  Chemotherapy given as ordered.  Patient tolerated well without complaint.  Port flushed and de-accessed.  Patient d/c to home.  Patient aware of return appointment.

## 2024-12-10 ENCOUNTER — Ambulatory Visit: Admitting: Family Medicine

## 2024-12-10 ENCOUNTER — Encounter: Payer: Self-pay | Admitting: Family Medicine

## 2024-12-10 VITALS — BP 88/53 | HR 102 | Temp 97.5°F | Ht 70.5 in | Wt 224.0 lb

## 2024-12-10 DIAGNOSIS — E538 Deficiency of other specified B group vitamins: Secondary | ICD-10-CM | POA: Diagnosis not present

## 2024-12-10 DIAGNOSIS — J42 Unspecified chronic bronchitis: Secondary | ICD-10-CM

## 2024-12-10 DIAGNOSIS — E790 Hyperuricemia without signs of inflammatory arthritis and tophaceous disease: Secondary | ICD-10-CM

## 2024-12-10 DIAGNOSIS — E039 Hypothyroidism, unspecified: Secondary | ICD-10-CM

## 2024-12-10 MED ORDER — ALLOPURINOL 100 MG PO TABS: ORAL_TABLET | ORAL | 0 refills | Status: AC

## 2024-12-10 MED ORDER — LEVOTHYROXINE SODIUM 25 MCG PO TABS: 25.0000 ug | ORAL_TABLET | Freq: Every day | ORAL | 1 refills | Status: DC

## 2024-12-10 NOTE — Progress Notes (Signed)
 "  Subjective:  Patient ID: Juan Merritt, male    DOB: Nov 24, 1951  Age: 73 y.o. MRN: [id number]  CC: Medical Management of Chronic Issues (Freezing at night ever since the hospital. Might be due to eliquis ?/Hospital added buspirone , increased gabapentin , midodrine , potassium doubled, metoprolol . )   HPI  Discussed the use of AI scribe software for clinical note transcription with the patient, who gave verbal consent to proceed.  History of Present Illness Juan Merritt is a 73 year old male with a history of cancer and blood clots who presents with persistent cold intolerance and low blood pressure. He is accompanied by his wife, who is also his caregiver.  He experiences persistent cold intolerance, describing it as 'freezing to death,' despite using blankets and having the heater on. His blood pressure was recorded at 88 mmHg during the visit, although it typically ranges from 117 to 130 mmHg at home. He is currently taking midodrine  three times a day to manage his blood pressure.  He has a consistently elevated heart rate, which was 122 bpm this morning and generally runs over 100 bpm. He mentions a recent episode of supraventricular tachycardia (SVT) for which he was given medication. He is currently on metoprolol  to manage his heart rate.  He underwent immune therapy the previous day and has been experiencing numbness in his legs, which he attributes to prolonged sitting. He feels weak and has difficulty walking, although he can move around the house. He spends a significant amount of time sitting and watching television and experiences numbness in his legs.  He is on multiple medications, including Eliquis  for blood clot prevention due to his cancer history, metoprolol  for heart rate control, gabapentin  for pain relief, and potassium supplements. He takes four potassium pills daily, and his potassium levels are just within the normal range. He also takes buspirone  for anxiety, which  was started during a hospital stay. His thyroid  function was checked recently, with a T4 level of 1.26. His liver function was slightly abnormal in the past but is currently within the normal range. He has been taking vitamin B12 and folic acid  since a hospital stay a few months ago.  He feels weak and has difficulty walking. He experiences numbness in his legs when sitting for extended periods.          12/10/2024    2:12 PM 11/27/2024    3:44 PM 11/25/2024   12:25 PM  Depression screen PHQ 2/9  Decreased Interest 0 0 0  Down, Depressed, Hopeless 0 0 0  PHQ - 2 Score 0 0 0  Altered sleeping 2 0   Tired, decreased energy 2 0   Change in appetite 1 0   Feeling bad or failure about yourself  0 0   Trouble concentrating 0 0   Moving slowly or fidgety/restless 0 0   Suicidal thoughts 0 0   PHQ-9 Score 5 0   Difficult doing work/chores Somewhat difficult Not difficult at all     History Juan Merritt has a past medical history of Arthritis, BPH associated with nocturia, Cancer (HCC), Chronic gout, COPD (chronic obstructive pulmonary disease) (HCC), History of colon polyps, History of COVID-19, Hyperlipidemia, mixed, Hypertension, Phimosis, and Type 2 diabetes mellitus (HCC).   He has a past surgical history that includes Laparoscopic cholecystectomy (2008); Tonsillectomy; Colonoscopy (12/01/2021); Circumcision (N/A, 05/19/2022); Endobronchial ultrasound (01/22/2024); Bronchial needle aspiration biopsy (01/22/2024); Bronchial biopsy (01/22/2024); Bronchial brushings (01/22/2024); Video bronchoscopy with radial endobronchial ultrasound (01/22/2024); Fine needle aspiration (01/22/2024);  Left heart cath; Esophagogastroduodenoscopy (N/A, 10/04/2024); and Esophageal dilation (N/A, 10/04/2024).   His family history includes Asthma in his mother; Diabetes in his father; Esophageal cancer in his paternal uncle; Heart attack in an other family member; Heart attack (age of onset: 64) in his father; Leukemia  in his brother; Lung disease in an other family member.He reports that he quit smoking about 7 years ago. His smoking use included cigarettes. He started smoking about 47 years ago. He has a 10 pack-year smoking history. His smokeless tobacco use includes chew. He reports that he does not currently use alcohol after a past usage of about 24.0 standard drinks of alcohol per week. He reports that he does not use drugs.    ROS Review of Systems  Constitutional: Negative.   HENT: Negative.    Eyes:  Negative for visual disturbance.  Respiratory:  Negative for cough and shortness of breath.   Cardiovascular:  Negative for chest pain and leg swelling.  Gastrointestinal:  Negative for abdominal pain, diarrhea, nausea and vomiting.  Genitourinary:  Negative for difficulty urinating.  Musculoskeletal:  Negative for arthralgias and myalgias.  Skin:  Negative for rash.  Neurological:  Negative for headaches.  Psychiatric/Behavioral:  Negative for sleep disturbance.     Objective:  BP (!) 88/53   Pulse (!) 102   Temp (!) 97.5 F (36.4 C)   Ht 5' 10.5 (1.791 m)   Wt 224 lb (101.6 kg)   SpO2 93%   BMI 31.69 kg/m   BP Readings from Last 3 Encounters:  12/10/24 (!) 88/53  12/04/24 (!) 100/52  12/03/24 116/72    Wt Readings from Last 3 Encounters:  12/10/24 224 lb (101.6 kg)  12/04/24 225 lb 6.4 oz (102.2 kg)  12/03/24 219 lb (99.3 kg)     Physical Exam Physical Exam VITALS: P- 122, BP- 88/ GENERAL: Alert, cooperative, well developed, no acute distress HEENT: Normocephalic, normal oropharynx, moist mucous membranes CHEST: Clear to auscultation bilaterally, No wheezes, rhonchi, or crackles CARDIOVASCULAR: Normal heart rate and rhythm, S1 and S2 normal without murmurs ABDOMEN: Soft, non-tender, non-distended, without organomegaly, Normal bowel sounds EXTREMITIES: No cyanosis or edema NEUROLOGICAL: Cranial nerves grossly intact, Moves all extremities without gross motor or sensory  deficit   Assessment & Plan:  Acquired hypothyroidism -     TSH + free T4; Future -     CMP14+EGFR  Hyperuricemia -     Allopurinol ; TAKE 1 TABLET (100 MG TOTAL) BY MOUTH DAILY. TO PREVENT GOUT  Dispense: 90 tablet; Refill: 0  Chronic bronchitis, unspecified chronic bronchitis type (HCC)  Vitamin B12 deficiency -     Folate -     Vitamin B12  Other orders -     Levothyroxine  Sodium; Take 1 tablet (25 mcg total) by mouth daily.  Dispense: 30 tablet; Refill: 1    Assessment and Plan Assessment & Plan Acquired hypothyroidism   Thyroid  function tests show borderline hypothyroidism with TSH levels near abnormal. He feels cold, possibly due to thyroid  function. Started low dose thyroid  hormone therapy. Follow-up in six weeks to reassess thyroid  function.  Orthostatic hypotension   Blood pressure is low, with office readings as low as 88/xx, though better at home. He feels cold, possibly due to low blood pressure. Metoprolol  is used for heart rate control and may contribute to low blood pressure. Continue metoprolol  and monitor blood pressure regularly.  Vitamin B12 deficiency   Vitamin B12 supplementation was initiated in the hospital, but current status is unclear. Stopped  vitamin B12 and folic acid  supplementation. Will reassess vitamin B12 levels at follow-up.  Anxiety disorder   Buspirone  was added during hospitalization. He reports irritability but not significant anxiety. Discontinuation may worsen irritability. Continue buspirone  for anxiety management.       Follow-up: Return in about 6 weeks (around 01/21/2025).  Butler Der, M.D. "

## 2024-12-11 DIAGNOSIS — E1140 Type 2 diabetes mellitus with diabetic neuropathy, unspecified: Secondary | ICD-10-CM | POA: Diagnosis not present

## 2024-12-11 DIAGNOSIS — Z794 Long term (current) use of insulin: Secondary | ICD-10-CM | POA: Diagnosis not present

## 2024-12-16 ENCOUNTER — Other Ambulatory Visit: Payer: Self-pay | Admitting: *Deleted

## 2024-12-16 NOTE — Patient Instructions (Signed)
 Visit Information  Thank you for taking time to visit with me today. Please don't hesitate to contact me if I can be of assistance to you before our next scheduled telephone appointment.  Our next appointment is by telephone on 12/23/24 @ 915 am  Following is a copy of your care plan:   Goals Addressed             This Visit's Progress    VBCI Transitions of Care (TOC) Care Plan       Problems:  Recent Hospitalization for treatment of Septic Shock Pt has lung cancer, follows up with cancer, feels that weight loss is due to related treatment, pt has had 60 pounds weight loss in the past 8 months Pt weighs daily, checks BP and CBG daily, spouse is RN, no new concerns reported 12/29  Goal:  Over the next 30 days, the patient will not experience hospital readmission  Interventions:  Evaluation of current treatment plan related to Septic Shock, self-management and patient's adherence to plan as established by provider. Discussed plans with patient for ongoing care management follow up and provided patient with direct contact information for care management team Evaluation of current treatment plan related to Septic Shock and patient's adherence to plan as established by provider Provided education to patient re: Septic Shock signs/ symptoms, reportable signs /symptoms Discussed plans with patient for ongoing care management follow up and provided patient with direct contact information for care management team Reviewed all upcoming scheduled appointments including primary care provider on 01/22/25, labwork 01/17/25 Reinforced signs /symptoms of infection Encouraged pt to eat small, frequent meals, avoid spicy, greasy food, limit intake of mountain dew Reviewed with pt/ spouse to continue checking BP daily and keeping a log, continue daily weights Reinforced HF action plan  Patient Self Care Activities:  Attend all scheduled provider appointments Call pharmacy for medication refills 3-7  days in advance of running out of medications Call provider office for new concerns or questions  Notify RN Care Manager of TOC call rescheduling needs Participate in Transition of Care Program/Attend TOC scheduled calls Take medications as prescribed   Follow Heart Failure action plan as discussed Continue checking BP, weight daily and keeping a log, take log to provider appointments  Plan: Telephone outreach 12/23/24 @ 915 am        Care plan and visit instructions communicated with the patient verbally today. Patient agrees to receive a copy in MyChart. Active MyChart status and patient understanding of how to access instructions and care plan via MyChart confirmed with patient.     Telephone follow up appointment with care management team member scheduled for:   12/23/24 @ 915 am Please call the care guide team at [phone] if you need to cancel or reschedule your appointment.   Please call the Suicide and Crisis Lifeline: 988 call the [place]  National Suicide Prevention Lifeline: 726-616-2503 or TTY: 501-474-7944 TTY 3037204141) to talk to a trained counselor call 1-800-273-TALK (toll free, 24 hour hotline) go to [facility] [address], [place] [phone]) call the [facility] Crisis Line: [phone]) [phone] call 911 if you are experiencing a Mental Health or Behavioral Health Crisis or need someone to talk to.  Mliss Creed [facility], BSN RN Care Manager/ Transition of Care [place]/ [name] [phone]

## 2024-12-16 NOTE — Patient Outreach (Signed)
 " Transition of Care week 3  Visit Note  12/16/2024  Name: Juan Merritt MRN: [id number]          DOB: Sep 20, 1951  Situation: Patient enrolled in [facility] 30-day program. Visit completed with patient by telephone.   Background:  Discharge Date and Diagnosis: 11/23/24, Septic Shock   Past Medical History:  Diagnosis Date   Arthritis    BPH associated with nocturia    Cancer (HCC)    stage III non-small cell lung cancer.   Chronic gout    COPD (chronic obstructive pulmonary disease) (HCC)    not on home o2   History of colon polyps    History of COVID-19    04/ 2019 hospital admission in epic, severe sepsis due to covid w/ acute respiratory failure;   and 05/ 2021 mild to moderate symptoms, recovered at home   Hyperlipidemia, mixed    Hypertension    Phimosis    Type 2 diabetes mellitus (HCC)    followed by pcp   (05-17-2022  pt stated checks sugar multiple times daily w/ Libre,  fasting average --- 100-110)    Assessment: Patient Reported Symptoms: Cognitive Cognitive Status: No symptoms reported, Alert and oriented to person, place, and time, Able to follow simple commands, Normal speech and language skills      Neurological Neurological Review of Symptoms: No symptoms reported    HEENT HEENT Symptoms Reported: No symptoms reported      Cardiovascular Cardiovascular Symptoms Reported: Other: Other Cardiovascular Symptoms: wearing compression hose, chronic LE bil edema per pt Does patient have uncontrolled Hypertension?: No Cardiovascular Management Strategies: Adequate rest, Routine screening, Medication therapy Weight: 221 lb (100.2 kg) Cardiovascular Self-Management Outcome: 4 (good) Cardiovascular Comment: reviewed HF action plan  Respiratory Respiratory Symptoms Reported: No symptoms reported    Endocrine Endocrine Symptoms Reported: No symptoms reported Is patient diabetic?: Yes Is patient checking blood sugars at home?: Yes List most recent blood sugar  readings, include date and time of day: Freestyle Libre  FBS today 108 per pt report Endocrine Self-Management Outcome: 4 (good) Endocrine Comment: reviewed carbohydrate modified diet  Gastrointestinal Gastrointestinal Symptoms Reported: No symptoms reported      Genitourinary Genitourinary Symptoms Reported: No symptoms reported    Integumentary Integumentary Symptoms Reported: No symptoms reported    Musculoskeletal Musculoskelatal Symptoms Reviewed: No symptoms reported        Psychosocial Psychosocial Symptoms Reported: No symptoms reported         Today's Vitals   12/16/24 0909  BP: 119/74  Weight: 221 lb (100.2 kg)   Pain Scale: 0-10 Pain Score: 0-No pain  Medications Reviewed Today     Reviewed by Aura Mliss LABOR, RN (Registered Nurse) on 12/16/24 at [phone]  Med List Status: <None>   Medication Order Taking? Sig Documenting Provider Last Dose Status Informant  acetaminophen  (TYLENOL ) 325 MG tablet [id number]  Take 2 tablets (650 mg total) by mouth every 6 (six) hours as needed for mild pain (pain score 1-3) or fever (or Fever >/= 101). Pearlean Manus, MD  Active Self, Spouse/Significant Other, Pharmacy Records  acidophilus (RISAQUAD) CAPS capsule [id number]  Take 1 capsule by mouth daily at 6 (six) AM. Ricky Fines, MD  Active   albuterol  (VENTOLIN  HFA) 108 (90 Base) MCG/ACT inhaler [id number]  Inhale 2 puffs into the lungs every 4 (four) hours as needed for wheezing or shortness of breath. Pearlean Manus, MD  Active Self, Spouse/Significant Other, Pharmacy Records  allopurinol  (ZYLOPRIM ) 100 MG tablet [id number]  TAKE  1 TABLET (100 MG TOTAL) BY MOUTH DAILY. TO PREVENT GOUT Zollie Lowers, MD  Active   apixaban  (ELIQUIS ) 5 MG TABS tablet [id number]  Take 1 tablet (5 mg total) by mouth 2 (two) times daily. Pearlean Manus, MD  Active Self, Spouse/Significant Other, Pharmacy Records  budesonide -formoterol  (SYMBICORT ) 160-4.5 MCG/ACT inhaler [id number]  Inhale 2 puffs into the  lungs 2 (two) times daily. Pearlean Manus, MD  Active Self, Spouse/Significant Other, Pharmacy Records  busPIRone  (BUSPAR ) 7.5 MG tablet [id number]  Take 1 tablet (7.5 mg total) by mouth 2 (two) times daily. Ricky Fines, MD  Active   Continuous Glucose Sensor (FREESTYLE LIBRE 2 SENSOR) [place] [id number]  Apply 1 Units topically once a week. USE TO TEST BLOOD SUGAR CONTINUOUSLY. Dx. E11.65 SEND TO [facility] FOR MEDICARE COVERAGE, DO NOT SEND TO LOCAL PHARMACY Emokpae, Courage, MD  Active Self, Spouse/Significant Other, Pharmacy Records  cyanocobalamin  (VITAMIN B12) 1000 MCG tablet [id number]  Take 1 tablet (1,000 mcg total) by mouth daily. Pearlean Manus, MD  Active Self, Spouse/Significant Other, Pharmacy Records  folic acid  (FOLATE) 400 MCG tablet [id number]  Take 1 tablet (400 mcg total) by mouth daily. Pearlean Manus, MD  Active Self, Spouse/Significant Other, Pharmacy Records  gabapentin  (NEURONTIN ) 300 MG capsule [id number]  Take 1 capsule (300 mg total) by mouth 2 (two) times daily. Ricky Fines, MD  Active   ipratropium-albuterol  (DUONEB) 0.5-2.5 (3) MG/3ML SOLN [id number]  Take 3 mLs by nebulization every 6 (six) hours as needed. Pearlean Manus, MD  Active Self, Spouse/Significant Other, Pharmacy Records  levothyroxine  (SYNTHROID ) 25 MCG tablet [id number]  Take 1 tablet (25 mcg total) by mouth daily. Zollie Lowers, MD  Active   lidocaine  (XYLOCAINE ) 2 % solution [id number]  Use as directed 15 mLs in the mouth or throat every 3 (three) hours as needed (throat pain, reflux). Will have to swallow due to presence of yeast and pain in the esophagus Stacks, Lowers, MD  Active Self, Spouse/Significant Other, Pharmacy Records  metFORMIN  (GLUCOPHAGE -XR) 500 MG 24 hr tablet [id number]  Take 1 tablet (500 mg total) by mouth daily with breakfast. Zollie Lowers, MD  Active Self, Spouse/Significant Other, Pharmacy Records  metoprolol  succinate (TOPROL -XL) 25 MG 24 hr tablet [id number]  Take 1 tablet  (25 mg total) by mouth 2 (two) times daily. Strader, Brittany M, PA-C  Active   midodrine  (PROAMATINE ) 10 MG tablet [id number]  Take 1 tablet (10 mg total) by mouth 3 (three) times daily with meals. Ricky Fines, MD  Active   pantoprazole  (PROTONIX ) 40 MG tablet [id number]  Take 1 tablet (40 mg total) by mouth daily. Ricky Fines, MD  Active   potassium chloride  SA (KLOR-CON  M20) 20 MEQ tablet [id number]  Take 2 tablets (40 mEq total) by mouth 2 (two) times daily. Ricky Fines, MD  Active   tamsulosin  (FLOMAX ) 0.4 MG CAPS capsule [id number]  Take 1 capsule (0.4 mg total) by mouth daily after supper. Ricky Fines, MD  Active   thiamine  (VITAMIN B-1) 100 MG tablet [id number]  Take 1 tablet (100 mg total) by mouth daily. Pearlean Manus, MD  Active Self, Spouse/Significant Other, Pharmacy Records  torsemide  (DEMADEX ) 20 MG tablet [id number]  Take 1 tablet (20 mg total) by mouth daily. Strader, Brittany M, PA-C  Active   traMADol  (ULTRAM ) 50 MG tablet [id number]  Take 1 tablet (50 mg total) by mouth 4 (four) times daily as needed for moderate pain (pain score 4-6). For cancer related pain Zollie Lowers, MD  Active Self, Spouse/Significant Other, Pharmacy Records  Goals Addressed             This Visit's Progress    VBCI Transitions of Care (TOC) Care Plan       Problems:  Recent Hospitalization for treatment of Septic Shock Pt has lung cancer, follows up with cancer, feels that weight loss is due to related treatment, pt has had 60 pounds weight loss in the past 8 months Pt weighs daily, checks BP and CBG daily, spouse is RN, no new concerns reported 12/29  Goal:  Over the next 30 days, the patient will not experience hospital readmission  Interventions:  Evaluation of current treatment plan related to Septic Shock, self-management and patient's adherence to plan as established by provider. Discussed plans with patient for ongoing care management follow up and provided  patient with direct contact information for care management team Evaluation of current treatment plan related to Septic Shock and patient's adherence to plan as established by provider Provided education to patient re: Septic Shock signs/ symptoms, reportable signs /symptoms Discussed plans with patient for ongoing care management follow up and provided patient with direct contact information for care management team Reviewed all upcoming scheduled appointments including primary care provider on 01/22/25, labwork 01/17/25 Reinforced signs /symptoms of infection Encouraged pt to eat small, frequent meals, avoid spicy, greasy food, limit intake of mountain dew Reviewed with pt/ spouse to continue checking BP daily and keeping a log, continue daily weights Reinforced HF action plan  Patient Self Care Activities:  Attend all scheduled provider appointments Call pharmacy for medication refills 3-7 days in advance of running out of medications Call provider office for new concerns or questions  Notify RN Care Manager of TOC call rescheduling needs Participate in Transition of Care Program/Attend TOC scheduled calls Take medications as prescribed   Follow Heart Failure action plan as discussed Continue checking BP, weight daily and keeping a log, take log to provider appointments  Plan: Telephone outreach 12/23/24 @ 915 am         Recommendation:   PCP Follow-up Continue Current Plan of Care  Follow Up Plan:   Telephone follow-up 12/23/24 @ 915 am  Mliss Creed [facility], BSN RN Care Manager/ Transition of Care [place]/ [facility] [phone]     "

## 2024-12-22 ENCOUNTER — Emergency Department (HOSPITAL_COMMUNITY)

## 2024-12-22 ENCOUNTER — Emergency Department (HOSPITAL_COMMUNITY)
Admission: EM | Admit: 2024-12-22 | Discharge: 2024-12-22 | Disposition: A | Discharge status: Home / Self Care | Attending: Emergency Medicine | Admitting: Emergency Medicine

## 2024-12-22 ENCOUNTER — Other Ambulatory Visit: Payer: Self-pay

## 2024-12-22 ENCOUNTER — Encounter (HOSPITAL_COMMUNITY): Payer: Self-pay | Admitting: *Deleted

## 2024-12-22 DIAGNOSIS — E860 Dehydration: Secondary | ICD-10-CM | POA: Insufficient documentation

## 2024-12-22 DIAGNOSIS — R531 Weakness: Secondary | ICD-10-CM

## 2024-12-22 DIAGNOSIS — E1140 Type 2 diabetes mellitus with diabetic neuropathy, unspecified: Secondary | ICD-10-CM | POA: Insufficient documentation

## 2024-12-22 DIAGNOSIS — Z79899 Other long term (current) drug therapy: Secondary | ICD-10-CM | POA: Insufficient documentation

## 2024-12-22 DIAGNOSIS — Z7901 Long term (current) use of anticoagulants: Secondary | ICD-10-CM | POA: Insufficient documentation

## 2024-12-22 DIAGNOSIS — Z8616 Personal history of COVID-19: Secondary | ICD-10-CM | POA: Insufficient documentation

## 2024-12-22 DIAGNOSIS — I5032 Chronic diastolic (congestive) heart failure: Secondary | ICD-10-CM | POA: Insufficient documentation

## 2024-12-22 DIAGNOSIS — J449 Chronic obstructive pulmonary disease, unspecified: Secondary | ICD-10-CM | POA: Insufficient documentation

## 2024-12-22 DIAGNOSIS — Z7984 Long term (current) use of oral hypoglycemic drugs: Secondary | ICD-10-CM | POA: Insufficient documentation

## 2024-12-22 DIAGNOSIS — Z85118 Personal history of other malignant neoplasm of bronchus and lung: Secondary | ICD-10-CM | POA: Insufficient documentation

## 2024-12-22 DIAGNOSIS — I110 Hypertensive heart disease with heart failure: Secondary | ICD-10-CM | POA: Insufficient documentation

## 2024-12-22 DIAGNOSIS — F17210 Nicotine dependence, cigarettes, uncomplicated: Secondary | ICD-10-CM | POA: Insufficient documentation

## 2024-12-22 LAB — COMPREHENSIVE METABOLIC PANEL WITH GFR
ALT: 19 U/L (ref 0–44)
AST: 34 U/L (ref 15–41)
Albumin: 2.8 g/dL — ABNORMAL LOW (ref 3.5–5.0)
Alkaline Phosphatase: 134 U/L — ABNORMAL HIGH (ref 38–126)
Anion gap: 13 (ref 5–15)
BUN: 12 mg/dL (ref 8–23)
CO2: 21 mmol/L — ABNORMAL LOW (ref 22–32)
Calcium: 8.3 mg/dL — ABNORMAL LOW (ref 8.9–10.3)
Chloride: 100 mmol/L (ref 98–111)
Creatinine, Ser: 0.94 mg/dL (ref 0.61–1.24)
GFR, Estimated: >60 mL/min
Glucose, Bld: 121 mg/dL — ABNORMAL HIGH (ref 70–99)
Potassium: 3.8 mmol/L (ref 3.5–5.1)
Sodium: 135 mmol/L (ref 135–145)
Total Bilirubin: 0.7 mg/dL (ref 0.0–1.2)
Total Protein: 7 g/dL (ref 6.5–8.1)

## 2024-12-22 LAB — RESP PANEL BY RT-PCR (RSV, FLU A&B, COVID)  RVPGX2
Influenza A by PCR: NEGATIVE
Influenza B by PCR: NEGATIVE
Resp Syncytial Virus by PCR: NEGATIVE
SARS Coronavirus 2 by RT PCR: NEGATIVE

## 2024-12-22 LAB — CBC WITH DIFFERENTIAL/PLATELET
Abs Immature Granulocytes: 0.04 K/uL (ref 0.00–0.07)
Basophils Absolute: 0 K/uL (ref 0.0–0.1)
Basophils Relative: 0 %
Eosinophils Absolute: 0 K/uL (ref 0.0–0.5)
Eosinophils Relative: 0 %
HCT: 31.3 % — ABNORMAL LOW (ref 39.0–52.0)
Hemoglobin: 10 g/dL — ABNORMAL LOW (ref 13.0–17.0)
Immature Granulocytes: 1 %
Lymphocytes Relative: 14 %
Lymphs Abs: 1 K/uL (ref 0.7–4.0)
MCH: 30.6 pg (ref 26.0–34.0)
MCHC: 31.9 g/dL (ref 30.0–36.0)
MCV: 95.7 fL (ref 80.0–100.0)
Monocytes Absolute: 1 K/uL (ref 0.1–1.0)
Monocytes Relative: 13 %
Neutro Abs: 5.2 K/uL (ref 1.7–7.7)
Neutrophils Relative %: 72 %
Platelets: 233 K/uL (ref 150–400)
RBC: 3.27 MIL/uL — ABNORMAL LOW (ref 4.22–5.81)
RDW: 15.5 % (ref 11.5–15.5)
WBC: 7.3 K/uL (ref 4.0–10.5)
nRBC: 0 % (ref 0.0–0.2)

## 2024-12-22 LAB — CBG MONITORING, ED: Glucose-Capillary: 124 mg/dL — ABNORMAL HIGH (ref 70–99)

## 2024-12-22 LAB — URINALYSIS, W/ REFLEX TO CULTURE (INFECTION SUSPECTED)
Bacteria, UA: NONE SEEN
Bilirubin Urine: NEGATIVE
Glucose, UA: NEGATIVE mg/dL
Hgb urine dipstick: NEGATIVE
Ketones, ur: NEGATIVE mg/dL
Leukocytes,Ua: NEGATIVE
Nitrite: NEGATIVE
Protein, ur: NEGATIVE mg/dL
Specific Gravity, Urine: 1.01 (ref 1.005–1.030)
pH: 6 (ref 5.0–8.0)

## 2024-12-22 LAB — LACTIC ACID, PLASMA
Lactic Acid, Venous: 2 mmol/L (ref 0.5–1.9)
Lactic Acid, Venous: 1.1 mmol/L (ref 0.5–1.9)

## 2024-12-22 MED ORDER — SODIUM CHLORIDE 0.9 % IV BOLUS
1000.0000 mL | Freq: Once | INTRAVENOUS | Status: AC
  Administered 2024-12-22: 1000 mL via INTRAVENOUS

## 2024-12-22 NOTE — ED Notes (Signed)
 Pt/family received d/c paperwork at this time. After going over the paperwork any questions, comments, or concerns were answered to the best of this nurse's knowledge. The pt/family verbally acknowledged the teachings/instructions.

## 2024-12-22 NOTE — ED Notes (Signed)
"  BGL 124  "

## 2024-12-22 NOTE — ED Triage Notes (Signed)
 Pt with increase in generalized weakness, + abd pain, chills at night, pt with diabetes and CBG's mid to low 60's.  Pt recent discharged on 11/23/24 for sepsis. Family member states pt is in remission from lung CA. Pt with hiatal hernia. Was started on Midodrine  for low BP.

## 2024-12-22 NOTE — Discharge Instructions (Addendum)
 We evaluated you for your weakness and fatigue.  Your testing in the emergency department is overall reassuring.  We did see some signs of dehydration.  You did receive IV fluids with some improvement in your heart rate and lab tests.  We do not know the exact cause of your symptoms, but suspect many of your symptoms are due to your hospitalization and ICU stay.  This can cause significant weakness and fatigue.  Your medications may also be related but we think this is less likely.  Please discuss this with your primary physician.  We did send blood cultures to check for any infection.  If these return positive, we will call you back to the emergency department.  Please also return if you develop any new symptoms such as fevers, chest pain or abdominal pain, vomiting, difficulty breathing, trouble walking, or any other new symptoms.

## 2024-12-22 NOTE — ED Provider Notes (Signed)
 " [place] EMERGENCY DEPARTMENT AT [facility] Provider Note  CSN: [id number] Arrival date & time: 12/22/24 1641  Chief Complaint(s) Weakness  HPI Juan Merritt is a 74 y.o. male history of COPD, diabetes, history of lung cancer, hypertension, hyperlipidemia presenting with weakness.  Patient reports recent admission for sepsis in December.  Since discharge has had generalized weakness, fatigue, malaise, not getting up off the couch.  Patient thinks it is because he was started on some any new medications.  No fevers but occasionally has chills.  Occasionally has abdominal pain but not currently.  No nausea or vomiting Or diarrhea.  No urinary symptoms.  No cough.  Reports has had some low blood sugar occasionally at home.  Denies shortness of breath, chest pain.   Past Medical History Past Medical History:  Diagnosis Date   Arthritis    BPH associated with nocturia    Cancer (HCC)    stage III non-small cell lung cancer.   Chronic gout    COPD (chronic obstructive pulmonary disease) (HCC)    not on home o2   History of colon polyps    History of COVID-19    04/ 2019 hospital admission in epic, severe sepsis due to covid w/ acute respiratory failure;   and 05/ 2021 mild to moderate symptoms, recovered at home   Hyperlipidemia, mixed    Hypertension    Phimosis    Type 2 diabetes mellitus (HCC)    followed by pcp   (05-17-2022  pt stated checks sugar multiple times daily w/ Libre,  fasting average --- 100-110)   Patient Active Problem List   Diagnosis Date Noted   Drug-induced myopathy 12/03/2024   Septic shock (HCC) 11/18/2024   Chronic diastolic heart failure (HCC) 11/18/2024   History of lung cancer 11/18/2024   GERD (gastroesophageal reflux disease) 11/18/2024   Hypokalemia 11/18/2024   Type 2 diabetes mellitus with complication, without long-term current use of insulin  (HCC) 11/18/2024   BPH (benign prostatic hyperplasia) 11/18/2024   Other chest pain  11/04/2024   Sepsis due to pneumonia (HCC) 10/01/2024   Chronic alcohol abuse 10/01/2024   Odynophagia 10/01/2024   Hyponatremia 10/01/2024   FTT (failure to thrive) in adult 10/01/2024   SOB (shortness of breath) 09/17/2024   COPD with acute exacerbation (HCC) 09/17/2024   Encounter for smoking cessation counseling 09/17/2024   Tobacco dependence 09/17/2024   Type 2 diabetes mellitus with diabetic neuropathy, with long-term current use of insulin  (HCC) 08/07/2024   Transaminitis 04/11/2024   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   Fever 03/31/2024   Hypotension 03/31/2024   Dehydration 03/31/2024   Weakness generalized 03/31/2024   Generalized weakness 03/31/2024   Malignant neoplasm of upper lobe of right lung (HCC) 12/01/2023   Positive colorectal cancer screening using Cologuard test 10/01/2021   Nonspecific abnormal electrocardiogram (ECG) (EKG) 06/23/2020   Primary hypertension 05/05/2020   Mixed hyperlipidemia 05/05/2020   Hyperuricemia 05/05/2020   Morbid obesity due to excess calories (HCC) 09/23/2019   Gout, chronic 07/01/2016   Incomplete rotator cuff tear 09/07/2015   Essential hypertension, benign 09/07/2015   COPD (chronic obstructive pulmonary disease) (HCC) 03/29/2015   Cigarette smoker 03/24/2015   Home Medication(s) Prior to Admission medications  Medication Sig Start Date End Date Taking? Authorizing Provider  acetaminophen  (TYLENOL ) 325 MG tablet Take 2 tablets (650 mg total) by mouth every 6 (six) hours as needed for mild pain (pain score 1-3) or fever (or Fever >/= 101). 10/04/24   Pearlean Manus, MD  acidophilus (RISAQUAD) CAPS capsule Take 1 capsule by mouth daily at 6 (six) AM. 11/24/24   Ricky Fines, MD  albuterol  (VENTOLIN  HFA) 108 [phone] Base) MCG/ACT inhaler Inhale 2 puffs into the lungs every 4 (four) hours as needed for wheezing or shortness of breath. 10/04/24   Pearlean Manus, MD  allopurinol  (ZYLOPRIM ) 100 MG tablet TAKE 1 TABLET (100 MG TOTAL) BY  MOUTH DAILY. TO PREVENT GOUT 12/10/24   Zollie Lowers, MD  apixaban  (ELIQUIS ) 5 MG TABS tablet Take 1 tablet (5 mg total) by mouth 2 (two) times daily. 10/04/24   Pearlean Manus, MD  budesonide -formoterol  (SYMBICORT ) 160-4.5 MCG/ACT inhaler Inhale 2 puffs into the lungs 2 (two) times daily. 10/04/24   Pearlean Manus, MD  busPIRone  (BUSPAR ) 7.5 MG tablet Take 1 tablet (7.5 mg total) by mouth 2 (two) times daily. 11/23/24   Ricky Fines, MD  Continuous Glucose Sensor (FREESTYLE LIBRE 2 SENSOR) MISC Apply 1 Units topically once a week. USE TO TEST BLOOD SUGAR CONTINUOUSLY. Dx. E11.65 SEND TO [facility] FOR MEDICARE COVERAGE, DO NOT SEND TO LOCAL PHARMACY 10/04/24   Pearlean Manus, MD  cyanocobalamin  (VITAMIN B12) 1000 MCG tablet Take 1 tablet (1,000 mcg total) by mouth daily. 10/04/24   Pearlean Manus, MD  folic acid  (FOLATE) 400 MCG tablet Take 1 tablet (400 mcg total) by mouth daily. 10/04/24   Pearlean Manus, MD  gabapentin  (NEURONTIN ) 300 MG capsule Take 1 capsule (300 mg total) by mouth 2 (two) times daily. 11/23/24   Ricky Fines, MD  ipratropium-albuterol  (DUONEB) 0.5-2.5 (3) MG/3ML SOLN Take 3 mLs by nebulization every 6 (six) hours as needed. 10/04/24   Pearlean Manus, MD  levothyroxine  (SYNTHROID ) 25 MCG tablet Take 1 tablet (25 mcg total) by mouth daily. 12/10/24   Zollie Lowers, MD  lidocaine  (XYLOCAINE ) 2 % solution Use as directed 15 mLs in the mouth or throat every 3 (three) hours as needed (throat pain, reflux). Will have to swallow due to presence of yeast and pain in the esophagus 10/16/24   Zollie Lowers, MD  metFORMIN  (GLUCOPHAGE -XR) 500 MG 24 hr tablet Take 1 tablet (500 mg total) by mouth daily with breakfast. 11/12/24   Zollie Lowers, MD  metoprolol  succinate (TOPROL -XL) 25 MG 24 hr tablet Take 1 tablet (25 mg total) by mouth 2 (two) times daily. 12/04/24   Strader, Laymon HERO, PA-C  midodrine  (PROAMATINE ) 10 MG tablet Take 1 tablet (10 mg total) by mouth 3  (three) times daily with meals. 11/23/24   Ricky Fines, MD  pantoprazole  (PROTONIX ) 40 MG tablet Take 1 tablet (40 mg total) by mouth daily. 11/24/24   Ricky Fines, MD  potassium chloride  SA (KLOR-CON  M20) 20 MEQ tablet Take 2 tablets (40 mEq total) by mouth 2 (two) times daily. 11/23/24   Ricky Fines, MD  tamsulosin  (FLOMAX ) 0.4 MG CAPS capsule Take 1 capsule (0.4 mg total) by mouth daily after supper. 11/23/24   Ricky Fines, MD  thiamine  (VITAMIN B-1) 100 MG tablet Take 1 tablet (100 mg total) by mouth daily. 10/05/24   Pearlean Manus, MD  torsemide  (DEMADEX ) 20 MG tablet Take 1 tablet (20 mg total) by mouth daily. 12/04/24   Strader, Laymon HERO, PA-C  traMADol  (ULTRAM ) 50 MG tablet Take 1 tablet (50 mg total) by mouth 4 (four) times daily as needed for moderate pain (pain score 4-6). For cancer related pain 11/12/24   Zollie Lowers, MD  Past Surgical History Past Surgical History:  Procedure Laterality Date   BRONCHIAL BIOPSY  01/22/2024   Procedure: BRONCHIAL BIOPSIES;  Surgeon: Shelah Lamar RAMAN, MD;  Location: [facility] ENDOSCOPY;  Service: Pulmonary;;   BRONCHIAL BRUSHINGS  01/22/2024   Procedure: BRONCHIAL BRUSHINGS;  Surgeon: Shelah Lamar RAMAN, MD;  Location: [facility] ENDOSCOPY;  Service: Pulmonary;;   BRONCHIAL NEEDLE ASPIRATION BIOPSY  01/22/2024   Procedure: BRONCHIAL NEEDLE ASPIRATION BIOPSIES;  Surgeon: Shelah Lamar RAMAN, MD;  Location: [facility] ENDOSCOPY;  Service: Pulmonary;;   CIRCUMCISION N/A 05/19/2022   Procedure: CIRCUMCISION ADULT;  Surgeon: Matilda Senior, MD;  Location: [facility];  Service: Urology;  Laterality: N/A;  45 MINUTES   COLONOSCOPY  12/01/2021   by dr legrand   ENDOBRONCHIAL ULTRASOUND  01/22/2024   Procedure: ENDOBRONCHIAL ULTRASOUND;  Surgeon: Shelah Lamar RAMAN, MD;  Location: [facility] ENDOSCOPY;  Service: Pulmonary;;   ESOPHAGEAL  DILATION N/A 10/04/2024   Procedure: DILATION, ESOPHAGUS;  Surgeon: Eartha Flavors, Toribio, MD;  Location: [facility] ENDO SUITE;  Service: Gastroenterology;  Laterality: N/A;   ESOPHAGOGASTRODUODENOSCOPY N/A 10/04/2024   Procedure: EGD (ESOPHAGOGASTRODUODENOSCOPY);  Surgeon: Eartha Flavors, Toribio, MD;  Location: [facility] ENDO SUITE;  Service: Gastroenterology;  Laterality: N/A;   FINE NEEDLE ASPIRATION  01/22/2024   Procedure: FINE NEEDLE ASPIRATION (FNA) LINEAR;  Surgeon: Shelah Lamar RAMAN, MD;  Location: [facility] ENDOSCOPY;  Service: Pulmonary;;   LAPAROSCOPIC CHOLECYSTECTOMY  2008   in [place]   LEFT HEART CATH     TONSILLECTOMY     child   VIDEO BRONCHOSCOPY WITH RADIAL ENDOBRONCHIAL ULTRASOUND  01/22/2024   Procedure: VIDEO BRONCHOSCOPY WITH RADIAL ENDOBRONCHIAL ULTRASOUND;  Surgeon: Shelah Lamar RAMAN, MD;  Location: [facility] ENDOSCOPY;  Service: Pulmonary;;   Family History Family History  Problem Relation Age of Onset   Asthma Mother    Diabetes Father    Heart attack Father 42   Leukemia Brother    Esophageal cancer Paternal Uncle    Lung disease Other        pat cousin   Heart attack Other        pat cousin   Colon cancer Neg Hx    Stomach cancer Neg Hx     Social History Social History[1] Allergies Statins  Review of Systems Review of Systems  All other systems reviewed and are negative.   Physical Exam Vital Signs  I have reviewed the triage vital signs BP 102/76   Pulse (!) 103   Temp 98.3 F (36.8 C) (Oral)   Resp (!) 21   Ht 5' 10.5 (1.791 m)   Wt 100.2 kg   SpO2 95%   BMI 31.25 kg/m  Physical Exam Vitals and nursing note reviewed.  Constitutional:      General: He is not in acute distress.    Appearance: Normal appearance.  HENT:     Mouth/Throat:     Mouth: Mucous membranes are moist.  Eyes:     Conjunctiva/sclera: Conjunctivae normal.  Cardiovascular:     Rate and Rhythm: Regular rhythm. Tachycardia present.  Pulmonary:     Effort: Pulmonary effort is  normal. No respiratory distress.     Breath sounds: Normal breath sounds.  Abdominal:     General: Abdomen is flat.     Palpations: Abdomen is soft.     Tenderness: There is no abdominal tenderness.  Musculoskeletal:     Right lower leg: No edema.     Left lower leg: No edema.  Skin:    General: Skin is warm and  dry.     Capillary Refill: Capillary refill takes less than 2 seconds.  Neurological:     Mental Status: He is alert and oriented to person, place, and time. Mental status is at baseline.  Psychiatric:        Mood and Affect: Mood normal.        Behavior: Behavior normal.     ED Results and Treatments Labs (all labs ordered are listed, but only abnormal results are displayed) Labs Reviewed  CBC WITH DIFFERENTIAL/PLATELET - Abnormal; Notable for the following components:      Result Value   RBC 3.27 (*)    Hemoglobin 10.0 (*)    HCT 31.3 (*)    All other components within normal limits  COMPREHENSIVE METABOLIC PANEL WITH GFR - Abnormal; Notable for the following components:   CO2 21 (*)    Glucose, Bld 121 (*)    Calcium  8.3 (*)    Albumin  2.8 (*)    Alkaline Phosphatase 134 (*)    All other components within normal limits  LACTIC ACID, PLASMA - Abnormal; Notable for the following components:   Lactic Acid, Venous 2.0 (*)    All other components within normal limits  CBG MONITORING, ED - Abnormal; Notable for the following components:   Glucose-Capillary 124 (*)    All other components within normal limits  CULTURE, BLOOD (ROUTINE X 2)  CULTURE, BLOOD (ROUTINE X 2)  RESP PANEL BY RT-PCR (RSV, FLU A&B, COVID)  RVPGX2  URINALYSIS, W/ REFLEX TO CULTURE (INFECTION SUSPECTED)  LACTIC ACID, PLASMA                                                                                                                          Radiology DG Chest Portable 1 View Result Date: 12/22/2024 EXAM: 1 VIEW(S) XRAY OF THE CHEST 12/22/2024 06:11:00 PM COMPARISON: Comparison with  11/27/2024. CLINICAL HISTORY: weakness FINDINGS: LINES, TUBES AND DEVICES: Left central venous catheter with tip at the low SVC region. LUNGS AND PLEURA: Emphysematous changes in the lungs with linear fibrosis or atelectasis in the lung bases. Central interstitial changes with peribronchial thickening suggesting chronic bronchitis. No pleural effusion. No pneumothorax. HEART AND MEDIASTINUM: Heart size and pulmonary vascularity are normal. Calcification of the aorta. BONES AND SOFT TISSUES: No acute osseous abnormality. IMPRESSION: 1. No acute cardiopulmonary abnormality. 2. Left central venous catheter tip projects at the low SVC. 3. Emphysematous and chronic bronchitic changes in the lungs. Linear atelectasis or fibrosis in the lung bases. Electronically signed by: Elsie Gravely MD 12/22/2024 06:30 PM EST RP Workstation: [id number]    Pertinent labs & imaging results that were available during my care of the patient were reviewed by me and considered in my medical decision making (see MDM for details).  Medications Ordered in ED Medications  sodium chloride  0.9 % bolus 1,000 mL (0 mLs Intravenous Stopped 12/22/24 2100)  Procedures Procedures  (including critical care time)  Medical Decision Making / ED Course   MDM:  74 year old presenting to the emergency department with fatigue.  Patient in no acute distress, vitals with borderline low blood pressure although patient on chronic midodrine  and did not take it this afternoon.  Also with mild tachycardia which improved after fluid administration.  Differential includes deconditioning from ICU and hospitalization, medication effect, occult infectious process although no fever, toxic or metabolic process such as dehydration or anemia.  Will check labs.  His symptoms have been essentially chronic for the past 3 weeks  but not acutely worsening per patient.  Given chills will check blood cultures as well.  Considered other process such as pulmonary embolism given tachycardia but is on chronic anticoagulation denies chest pain or shortness of breath  Clinical Course as of 12/22/24 2330  [place] Dec 22, 2024  2328 Workup is overall nonfocal.  Did have initial elevation of lactic acid however on recheck this improved following fluids.  His heart rate also improved after fluids.  Denies any focal infectious symptoms, no leukocytosis.  Blood cultures were obtained.  He feels better after fluids.  He is requesting discharge and would like to leave the hospital.  I think this is reasonable, discussed strict return precautions for any worsening.  Suspect primary cause of patient's symptoms is likely deconditioning related to ICU but hard to say for sure, recommended close follow-up with primary physician. Will discharge patient to home. All questions answered. Patient comfortable with plan of discharge. Return precautions discussed with patient and specified on the after visit summary.  [WS]    Clinical Course User Index [WS] Francesca Elsie CROME, MD     Additional history obtained: -Additional history obtained from spouse -External records from outside source obtained and reviewed including: Chart review including previous notes, labs, imaging, consultation notes including recent hospitalization   Lab Tests: -I ordered, reviewed, and interpreted labs.   The pertinent results include:   Labs Reviewed  CBC WITH DIFFERENTIAL/PLATELET - Abnormal; Notable for the following components:      Result Value   RBC 3.27 (*)    Hemoglobin 10.0 (*)    HCT 31.3 (*)    All other components within normal limits  COMPREHENSIVE METABOLIC PANEL WITH GFR - Abnormal; Notable for the following components:   CO2 21 (*)    Glucose, Bld 121 (*)    Calcium  8.3 (*)    Albumin  2.8 (*)    Alkaline Phosphatase 134 (*)    All other  components within normal limits  LACTIC ACID, PLASMA - Abnormal; Notable for the following components:   Lactic Acid, Venous 2.0 (*)    All other components within normal limits  CBG MONITORING, ED - Abnormal; Notable for the following components:   Glucose-Capillary 124 (*)    All other components within normal limits  CULTURE, BLOOD (ROUTINE X 2)  CULTURE, BLOOD (ROUTINE X 2)  RESP PANEL BY RT-PCR (RSV, FLU A&B, COVID)  RVPGX2  URINALYSIS, W/ REFLEX TO CULTURE (INFECTION SUSPECTED)  LACTIC ACID, PLASMA    Notable for mild hyperglycemia, mild anemia, slight elevation in   EKG   EKG Interpretation Date/Time:    Ventricular Rate:    PR Interval:    QRS Duration:    QT Interval:    QTC Calculation:   R Axis:      Text Interpretation:           Imaging Studies ordered: I ordered imaging studies  including CXR On my interpretation imaging demonstrates no acute process I independently visualized and interpreted imaging. I agree with the radiologist interpretation   Medicines ordered and prescription drug management: Meds ordered this encounter  Medications   sodium chloride  0.9 % bolus 1,000 mL    -I have reviewed the patients home medicines and have made adjustments as needed   Social Determinants of Health:  Diagnosis or treatment significantly limited by social determinants of health: former smoker   Reevaluation: After the interventions noted above, I reevaluated the patient and found that their symptoms have improved  Co morbidities that complicate the patient evaluation  Past Medical History:  Diagnosis Date   Arthritis    BPH associated with nocturia    Cancer (HCC)    stage III non-small cell lung cancer.   Chronic gout    COPD (chronic obstructive pulmonary disease) (HCC)    not on home o2   History of colon polyps    History of COVID-19    04/ 2019 hospital admission in epic, severe sepsis due to covid w/ acute respiratory failure;   and 05/ 2021  mild to moderate symptoms, recovered at home   Hyperlipidemia, mixed    Hypertension    Phimosis    Type 2 diabetes mellitus (HCC)    followed by pcp   (05-17-2022  pt stated checks sugar multiple times daily w/ Libre,  fasting average --- 100-110)      Dispostion: Disposition decision including need for hospitalization was considered, and patient discharged from emergency department.    Final Clinical Impression(s) / ED Diagnoses Final diagnoses:  Dehydration  Weakness acquired in ICU     This chart was dictated using voice recognition software.  Despite best efforts to proofread,  errors can occur which can change the documentation meaning.     [1]  Social History Tobacco Use   Smoking status: Former    Current packs/day: 0.00    Average packs/day: 0.3 packs/day for 40.0 years (10.0 ttl pk-yrs)    Types: Cigarettes    Start date: 01/15/1977    Quit date: 01/15/2017    Years since quitting: 7.9   Smokeless tobacco: Current    Types: Chew   Tobacco comments:    Smokes half to one pack per day for about 45 years - now also smokes e-cigarette sometimes and chews tobacco.  Updated 12/01/2023.  Vaping Use   Vaping status: Former   Substances: Nicotine    Devices: unsure  Substance Use Topics   Alcohol use: Not Currently    Alcohol/week: 24.0 standard drinks of alcohol    Types: 24 Cans of beer per week    Comment: pt drinks 3-4 beers a day   Drug use: Never     Francesca Elsie CROME, MD 12/22/24 2330  "

## 2024-12-23 ENCOUNTER — Other Ambulatory Visit: Payer: Self-pay | Admitting: *Deleted

## 2024-12-23 NOTE — Patient Outreach (Signed)
 " Transition of Care week 4  Visit Note  12/23/2024  Name: Juan Merritt MRN: [id number]          DOB: 07/06/1951  Situation: Patient enrolled in [facility] 30-day program. Visit completed with patient by telephone.   Background:  Discharge Date and Diagnosis: 11/23/24, Septic Shock   Past Medical History:  Diagnosis Date   Arthritis    BPH associated with nocturia    Cancer (HCC)    stage III non-small cell lung cancer.   Chronic gout    COPD (chronic obstructive pulmonary disease) (HCC)    not on home o2   History of colon polyps    History of COVID-19    04/ 2019 hospital admission in epic, severe sepsis due to covid w/ acute respiratory failure;   and 05/ 2021 mild to moderate symptoms, recovered at home   Hyperlipidemia, mixed    Hypertension    Phimosis    Type 2 diabetes mellitus (HCC)    followed by pcp   (05-17-2022  pt stated checks sugar multiple times daily w/ Libre,  fasting average --- 100-110)    Assessment: Patient Reported Symptoms: Cognitive Cognitive Status: No symptoms reported, Alert and oriented to person, place, and time, Able to follow simple commands, Normal speech and language skills      Neurological Neurological Review of Symptoms: No symptoms reported    HEENT HEENT Symptoms Reported: No symptoms reported      Cardiovascular Cardiovascular Symptoms Reported: Other: Other Cardiovascular Symptoms: wearing compression hose, chronic LE bil edema per pt Does patient have uncontrolled Hypertension?: No Cardiovascular Management Strategies: Adequate rest, Routine screening Weight: 218 lb (98.9 kg) Cardiovascular Self-Management Outcome: 4 (good) Cardiovascular Comment: reinforced HF action plan  Respiratory Respiratory Symptoms Reported: No symptoms reported    Endocrine Endocrine Symptoms Reported: No symptoms reported Is patient diabetic?: Yes Is patient checking blood sugars at home?: Yes List most recent blood sugar readings, include date and  time of day: Freestyle Libre   FBS 93 today per pt Endocrine Self-Management Outcome: 4 (good) Endocrine Comment: reinforced carbohydrate modified diet  Gastrointestinal Gastrointestinal Symptoms Reported: No symptoms reported Gastrointestinal Comment: reinforced importance of small, frequent meals, avoiding greasy, spicy food    Genitourinary Genitourinary Symptoms Reported: No symptoms reported    Integumentary Integumentary Symptoms Reported: No symptoms reported    Musculoskeletal Musculoskelatal Symptoms Reviewed: No symptoms reported        Psychosocial Psychosocial Symptoms Reported: No symptoms reported         Today's Vitals   12/23/24 0932  Weight: 218 lb (98.9 kg)   Pain Scale: 0-10 Pain Score: 0-No pain  Medications Reviewed Today     Reviewed by Aura Mliss LABOR, RN (Registered Nurse) on 12/23/24 at 0930  Med List Status: <None>   Medication Order Taking? Sig Documenting Provider Last Dose Status Informant  acetaminophen  (TYLENOL ) 325 MG tablet [id number]  Take 2 tablets (650 mg total) by mouth every 6 (six) hours as needed for mild pain (pain score 1-3) or fever (or Fever >/= 101). Pearlean Manus, MD  Active Self, Spouse/Significant Other, Pharmacy Records  acidophilus (RISAQUAD) CAPS capsule [id number]  Take 1 capsule by mouth daily at 6 (six) AM. Ricky Fines, MD  Active   albuterol  (VENTOLIN  HFA) 108 (90 Base) MCG/ACT inhaler [id number]  Inhale 2 puffs into the lungs every 4 (four) hours as needed for wheezing or shortness of breath. Pearlean Manus, MD  Active Self, Spouse/Significant Other, Pharmacy Records  allopurinol  (ZYLOPRIM ) 100  MG tablet [id number]  TAKE 1 TABLET (100 MG TOTAL) BY MOUTH DAILY. TO PREVENT GOUT Zollie Lowers, MD  Active   apixaban  (ELIQUIS ) 5 MG TABS tablet [id number]  Take 1 tablet (5 mg total) by mouth 2 (two) times daily. Pearlean Manus, MD  Active Self, Spouse/Significant Other, Pharmacy Records  budesonide -formoterol  (SYMBICORT )  160-4.5 MCG/ACT inhaler [id number]  Inhale 2 puffs into the lungs 2 (two) times daily. Pearlean Manus, MD  Active Self, Spouse/Significant Other, Pharmacy Records  busPIRone  (BUSPAR ) 7.5 MG tablet [id number]  Take 1 tablet (7.5 mg total) by mouth 2 (two) times daily. Ricky Fines, MD  Active   Continuous Glucose Sensor (FREESTYLE LIBRE 2 SENSOR) [place] [id number]  Apply 1 Units topically once a week. USE TO TEST BLOOD SUGAR CONTINUOUSLY. Dx. E11.65 SEND TO [facility] FOR MEDICARE COVERAGE, DO NOT SEND TO LOCAL PHARMACY Emokpae, Courage, MD  Active Self, Spouse/Significant Other, Pharmacy Records  cyanocobalamin  (VITAMIN B12) 1000 MCG tablet [id number]  Take 1 tablet (1,000 mcg total) by mouth daily. Pearlean Manus, MD  Active Self, Spouse/Significant Other, Pharmacy Records  folic acid  (FOLATE) 400 MCG tablet [id number]  Take 1 tablet (400 mcg total) by mouth daily. Pearlean Manus, MD  Active Self, Spouse/Significant Other, Pharmacy Records  gabapentin  (NEURONTIN ) 300 MG capsule [id number]  Take 1 capsule (300 mg total) by mouth 2 (two) times daily. Ricky Fines, MD  Active   ipratropium-albuterol  (DUONEB) 0.5-2.5 (3) MG/3ML SOLN [id number]  Take 3 mLs by nebulization every 6 (six) hours as needed. Pearlean Manus, MD  Active Self, Spouse/Significant Other, Pharmacy Records  levothyroxine  (SYNTHROID ) 25 MCG tablet [id number]  Take 1 tablet (25 mcg total) by mouth daily. Zollie Lowers, MD  Active   lidocaine  (XYLOCAINE ) 2 % solution [id number]  Use as directed 15 mLs in the mouth or throat every 3 (three) hours as needed (throat pain, reflux). Will have to swallow due to presence of yeast and pain in the esophagus Stacks, Lowers, MD  Active Self, Spouse/Significant Other, Pharmacy Records  metFORMIN  (GLUCOPHAGE -XR) 500 MG 24 hr tablet [id number]  Take 1 tablet (500 mg total) by mouth daily with breakfast. Zollie Lowers, MD  Active Self, Spouse/Significant Other, Pharmacy Records  metoprolol  succinate  (TOPROL -XL) 25 MG 24 hr tablet [id number]  Take 1 tablet (25 mg total) by mouth 2 (two) times daily. Strader, Brittany M, PA-C  Active   midodrine  (PROAMATINE ) 10 MG tablet [id number]  Take 1 tablet (10 mg total) by mouth 3 (three) times daily with meals. Ricky Fines, MD  Active   pantoprazole  (PROTONIX ) 40 MG tablet [id number]  Take 1 tablet (40 mg total) by mouth daily. Ricky Fines, MD  Active   potassium chloride  SA (KLOR-CON  M20) 20 MEQ tablet [id number]  Take 2 tablets (40 mEq total) by mouth 2 (two) times daily. Ricky Fines, MD  Active   tamsulosin  (FLOMAX ) 0.4 MG CAPS capsule [id number]  Take 1 capsule (0.4 mg total) by mouth daily after supper. Ricky Fines, MD  Active   thiamine  (VITAMIN B-1) 100 MG tablet [id number]  Take 1 tablet (100 mg total) by mouth daily. Pearlean Manus, MD  Active Self, Spouse/Significant Other, Pharmacy Records  torsemide  (DEMADEX ) 20 MG tablet [id number]  Take 1 tablet (20 mg total) by mouth daily. Strader, Brittany M, PA-C  Active   traMADol  (ULTRAM ) 50 MG tablet [id number]  Take 1 tablet (50 mg total) by mouth 4 (four) times daily as needed for moderate pain (pain score 4-6). For cancer related pain Zollie Lowers, MD  Active  Self, Spouse/Significant Other, Pharmacy Records            Goals Addressed             This Visit's Progress    COMPLETED: [facility] Transitions of Care (TOC) Care Plan       Problems:  Recent Hospitalization for treatment of Septic Shock Pt has lung cancer, follows up with cancer, feels that weight loss is due to related treatment, pt has had 60 pounds weight loss in the past 8 months Pt weighs daily, checks BP and CBG daily, reports appetite is better, spouse is RN, no new concerns reported 12/23/24  Goal:  Over the next 30 days, the patient will not experience hospital readmission  Interventions:  Evaluation of current treatment plan related to Septic Shock, self-management and patient's adherence to plan as established by  provider. Discussed plans with patient for ongoing care management follow up and provided patient with direct contact information for care management team Evaluation of current treatment plan related to Septic Shock and patient's adherence to plan as established by provider Provided education to patient re: Septic Shock signs/ symptoms, reportable signs /symptoms Discussed plans with patient for ongoing care management follow up and provided patient with direct contact information for care management team Reviewed all upcoming scheduled appointments including primary care provider on 01/22/25, labwork 01/17/25 Reviewed signs /symptoms of infection Encouraged pt to eat small, frequent meals, avoid spicy, greasy food, limit intake of mountain dew Reinforced with pt to continue checking BP daily and keeping a log, continue daily weights Reviewed HF action plan Reviewed plan of care including TOC case closure, pt states does not have further needs for case management  Patient Self Care Activities:  Attend all scheduled provider appointments Call pharmacy for medication refills 3-7 days in advance of running out of medications Call provider office for new concerns or questions  Notify RN Care Manager of TOC call rescheduling needs Participate in Transition of Care Program/Attend TOC scheduled calls Take medications as prescribed   Follow Heart Failure action plan as discussed Continue checking BP, weight daily and keeping a log, take log to provider appointments  Plan: Case closure        Recommendation:   PCP Follow-up  Follow Up Plan:   Closing From:  Transitions of Care Program  Mliss Creed [facility], BSN RN Care Manager/ Transition of Care [place] [phone]     "

## 2024-12-23 NOTE — Patient Instructions (Signed)
 Visit Information  Thank you for taking time to visit with me today. Please don't hesitate to contact me if I can be of assistance to you before our next scheduled telephone appointment.  Our next appointment is no further scheduled appointments.    Following is a copy of your care plan:   Goals Addressed             This Visit's Progress    COMPLETED: VBCI Transitions of Care (TOC) Care Plan       Problems:  Recent Hospitalization for treatment of Septic Shock Pt has lung cancer, follows up with cancer, feels that weight loss is due to related treatment, pt has had 60 pounds weight loss in the past 8 months Pt weighs daily, checks BP and CBG daily, reports appetite is better, spouse is RN, no new concerns reported 12/23/24  Goal:  Over the next 30 days, the patient will not experience hospital readmission  Interventions:  Evaluation of current treatment plan related to Septic Shock, self-management and patient's adherence to plan as established by provider. Discussed plans with patient for ongoing care management follow up and provided patient with direct contact information for care management team Evaluation of current treatment plan related to Septic Shock and patient's adherence to plan as established by provider Provided education to patient re: Septic Shock signs/ symptoms, reportable signs /symptoms Discussed plans with patient for ongoing care management follow up and provided patient with direct contact information for care management team Reviewed all upcoming scheduled appointments including primary care provider on 01/22/25, labwork 01/17/25 Reviewed signs /symptoms of infection Encouraged pt to eat small, frequent meals, avoid spicy, greasy food, limit intake of mountain dew Reinforced with pt to continue checking BP daily and keeping a log, continue daily weights Reviewed HF action plan Reviewed plan of care including TOC case closure, pt states does not have further needs  for case management  Patient Self Care Activities:  Attend all scheduled provider appointments Call pharmacy for medication refills 3-7 days in advance of running out of medications Call provider office for new concerns or questions  Notify RN Care Manager of TOC call rescheduling needs Participate in Transition of Care Program/Attend TOC scheduled calls Take medications as prescribed   Follow Heart Failure action plan as discussed Continue checking BP, weight daily and keeping a log, take log to provider appointments  Plan: Case closure        Care plan and visit instructions communicated with the patient verbally today. Patient agrees to receive a copy in MyChart. Active MyChart status and patient understanding of how to access instructions and care plan via MyChart confirmed with patient.     No further follow up required: case closure  Please call the care guide team at [phone] if you need to cancel or reschedule your appointment.   Please call the Suicide and Crisis Lifeline: 988 call the [place]  National Suicide Prevention Lifeline: 727 733 3622 or TTY: (364)342-6235 TTY (204) 696-7832) to talk to a trained counselor call 1-800-273-TALK (toll free, 24 hour hotline) go to [facility] [address], [place] [phone]) call the [facility] Crisis Line: 902-421-8818 call 911 if you are experiencing a Mental Health or Behavioral Health Crisis or need someone to talk to.  Mliss Creed [facility], BSN RN Care Manager/ Transition of Care [place]/ [facility] [phone]

## 2024-12-27 LAB — CULTURE, BLOOD (ROUTINE X 2)
Culture: NO GROWTH
Culture: NO GROWTH
Special Requests: ADEQUATE
Special Requests: ADEQUATE

## 2025-01-02 ENCOUNTER — Other Ambulatory Visit: Payer: Self-pay | Admitting: Family Medicine

## 2025-01-07 ENCOUNTER — Ambulatory Visit: Payer: Self-pay

## 2025-01-07 NOTE — Telephone Encounter (Signed)
 Wife of pt, Juan Merritt, called and reported the pt passed at least 35-45 cc of bright red blood in toilet approx 20 minutes ago. She reports she is a engineer, civil (consulting) and that the pt takes Eliquis  5mg  BID, is c/o nausea, weakness, and vague 8/10 abdominal pain. Juan Merritt was heard asking the pt repeated questions to ascertain his alertness. It seemed the pt required a great amount of prodding to answer. ED advised, Juan Merritt agreeable.    FYI Only or Action Required?: FYI only for provider: ED advised.  Patient was last seen in primary care on 12/10/2024 by Zollie Lowers, MD.  Called Nurse Triage reporting Rectal Bleeding, Poor appetite, and Nausea.  Symptoms began today.  Interventions attempted: Nothing.  Symptoms are: gradually worsening.  Triage Disposition: Go to ED Now (or PCP Triage)  Patient/caregiver understands and will follow disposition?: Yes    Reason for Triage: Wife requested triage. Blood in toilet on blood thinner.    Reason for Disposition  Patient sounds very sick or weak to the triager  Answer Assessment - Initial Assessment Questions 1. APPEARANCE of BLOOD: What color is it? Is it passed separately, on the surface of the stool, or mixed in with the stool?      Bright red blood  2. AMOUNT: How much blood was passed?      At least 35-45 cc per wife who reports she is a engineer, civil (consulting)  3. FREQUENCY: How many times has blood been passed with the stools?      At least one instance to my knowledge. He isn't very forthcoming.   4. ONSET: When was the blood first seen in the stools? (Days or weeks)      15 min ago   8. BLOOD THINNERS: Do you take any blood thinners? (e.g., aspirin, clopidogrel / Plavix, coumadin, heparin). Notes: Other strong blood thinners include: Arixtra (fondaparinux), Eliquis  (apixaban ), Pradaxa (dabigatran), and Xarelto (rivaroxaban).     Eliquis  5mg  BID  9. OTHER SYMPTOMS: Do you have any other symptoms?  (e.g., abdomen pain, vomiting, dizziness,  fever)     Abdominal pain 8/10, poor appetite, nausea, temperature 101.43F but he's been laying under a heating blanket. BP 132/85, HR 89 at time of triage per wife.  Protocols used: Rectal Bleeding-A-AH

## 2025-01-07 NOTE — Telephone Encounter (Signed)
Noted  -LS

## 2025-01-10 ENCOUNTER — Ambulatory Visit: Payer: Self-pay

## 2025-01-10 NOTE — Telephone Encounter (Signed)
 FYI Only or Action Required?: FYI only for provider: ED advised.  Patient was last seen in primary care on 12/10/2024 by Zollie Lowers, MD.  Called Nurse Triage reporting Extremity Weakness.  Symptoms began several days ago.  Interventions attempted: Rest, hydration, or home remedies.  Symptoms are: gradually worsening.  Triage Disposition: Go to ED Now (or PCP Triage)  Patient/caregiver understands and will follow disposition?: Yes    Message from Antwanette L sent at 01/10/2025  2:13 PM EST  Reason for Triage: Madeline, the patient's wife, called reporting that the patient is experiencing extreme weakness, and both legs feel as though they are going to give out underneath him   Reason for Disposition  Patient sounds very sick or weak to the triager    Concern for bleeding, recent falls, falling asleep on toilet  Answer Assessment - Initial Assessment Questions N/a  Answer Assessment - Initial Assessment Questions Wife initially calls in, Triage RN is able to add patient to call for further discussion with both patient and wife.   DESCRIPTION: Describe how you are feeling.     Weakness in knees, fell asleep on toilet recently due to lack of sleep per wife, appears to have returning blood in stools, is on a blood thinner, had a fall 2 days with no signs of injury per wife, possibly some R) knee pain, was not evaluated post fall  SEVERITY: How bad is it?  Can you stand and walk?     Is able to make coffee this AM and get up on own to bathroom but reports recently the legs have been giving out.  NEW MEDICINES:  Have you started on any new medicines recently? (e.g., opioid pain medicines, benzodiazepines, muscle relaxants, antidepressants, antihistamines, neuroleptics, beta blockers)     Reports since recent hospital visit has not felt well since, wife states no recent new medications.  Missed yesterday's evening meds.    OTHER SYMPTOMS: Do you have any other symptoms?  (e.g., chest pain, fever, cough, SOB, vomiting, diarrhea, bleeding, other areas of pain)     Patient denies listed symptoms however wife provides further details  Has noted some blood in stool, blood visibly seen in toilet water , wife estimates 35 cc when asked if it seemed to be a lot of blood in water .  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   Is in full remision of lung cancer as of April, goes to [facility], received Immune therapy nearly 42 days ago   Wife encouraged to go to ED the other day but patient refusing.  Patient agreeable to go to ED after this call, wife states will take to [facility].  Protocols used: Hospice - Weakness (Generalized) and Fatigue-A-AH, Weakness (Generalized) and Fatigue-A-AH

## 2025-01-10 NOTE — Telephone Encounter (Signed)
 Left message making pt aware that he will need to be seen and he should really go in to the ER. Advised to call back if needed.

## 2025-01-17 ENCOUNTER — Other Ambulatory Visit

## 2025-01-21 ENCOUNTER — Ambulatory Visit: Payer: Self-pay

## 2025-01-21 ENCOUNTER — Ambulatory Visit (INDEPENDENT_AMBULATORY_CARE_PROVIDER_SITE_OTHER): Admitting: Nurse Practitioner

## 2025-01-21 ENCOUNTER — Encounter: Payer: Self-pay | Admitting: Nurse Practitioner

## 2025-01-21 VITALS — BP 99/65 | HR 97 | Temp 98.0°F | Ht 70.0 in | Wt 222.0 lb

## 2025-01-21 DIAGNOSIS — L84 Corns and callosities: Secondary | ICD-10-CM

## 2025-01-21 MED ORDER — SULFAMETHOXAZOLE-TRIMETHOPRIM 800-160 MG PO TABS: 1.0000 | ORAL_TABLET | Freq: Two times a day (BID) | ORAL | 0 refills | Status: AC

## 2025-01-21 NOTE — Patient Instructions (Signed)
 Diabetes and Foot Care: What to Know Diabetes, also called diabetes mellitus, may cause problems with your feet and legs because of poor blood flow (circulation). Poor circulation may make your skin: Become thinner and drier. Break more easily. Heal more slowly. Peel and crack. You may also have nerve damage (neuropathy). This can cause decreased feeling in your legs and feet. This means that you may not notice minor injuries to your feet that could lead to more serious problems. Finding and treating problems early is the best way to prevent future foot problems. How to care for your feet Foot hygiene  Wash your feet daily with warm water  and mild soap. Do not use hot water . Then, pat your feet and the areas between your toes until they are fully dry. Do not soak your feet. This can dry your skin. Trim your toenails straight across. Do not dig under them or around the cuticle. File the edges of your nails with an emery board or nail file. Apply a moisturizing lotion or petroleum jelly to the skin on your feet and to dry, brittle toenails. Use lotion that does not contain alcohol and is unscented. Do not apply lotion between your toes. Shoes and socks Wear clean socks or stockings every day. Make sure they are not too tight. Do not wear knee-high stockings. These may decrease blood flow to your legs. Wear shoes that fit well and have enough cushioning. Always look in your shoes before you put them on to be sure there are no objects inside. To break in new shoes, wear them for just a few hours a day. This prevents injuries on your feet. Wounds, scrapes, corns, and calluses  Check your feet daily for blisters, cuts, bruises, sores, and redness. If you cannot see the bottom of your feet, use a mirror or ask someone for help. Do not cut off corns or calluses or try to remove them with medicine. If you find a minor scrape, cut, or break in the skin on your feet, keep it and the skin around it clean  and dry. You may clean these areas with mild soap and water . Do not clean the area with peroxide, alcohol, or iodine . If you have a wound, scrape, corn, or callus on your foot, look at it several times a day to make sure it is healing and not infected. Check for: Redness, swelling, or pain. Fluid or blood. Warmth. Pus or a bad smell. General tips Do not cross your legs. This may decrease blood flow to your feet. Do not use heating pads or hot water  bottles on your feet. They may burn your skin. If you have lost feeling in your feet or legs, you may not know this is happening until it is too late. Protect your feet from hot and cold by wearing shoes, such as at the beach or on hot pavement. Schedule a complete foot exam at least once a year or more often if you have foot problems. Report any cuts, sores, or bruises to your health care provider right away. Where to find more information American Diabetes Association: diabetes.org Association of Diabetes Care & Education Specialists: diabeteseducator.org Contact a health care provider if: You have a condition that increases your risk of infection, and you have any cuts, sores, or bruises on your feet. You have an injury that is not healing. You have redness on your legs or feet. You feel burning or tingling in your legs or feet. You have pain or cramps in  your legs and feet. Your legs or feet are numb. Your feet always feel cold. You have pain around any toenails. Get help right away if: You have a wound, scrape, corn, or callus on your foot and: You have signs of infection. You have a fever. You have a red line going up your leg. This information is not intended to replace advice given to you by your health care provider. Make sure you discuss any questions you have with your health care provider. Document Revised: 10/11/2024 Document Reviewed: 06/08/2022 Elsevier Patient Education  2025 [employer].

## 2025-01-21 NOTE — Progress Notes (Signed)
 "  Subjective:    Patient ID: Juan Merritt, male    DOB: May 25, 1951, 74 y.o.   MRN: [id number]   Chief Complaint: wound on left foot  HPI  Patient in with open area on left 5th toe. Area is dry and sore to touch. No drainage. No erythemaIs a diabetic with last HGBA1c was  6.6%.    Patient Active Problem List   Diagnosis Date Noted   Drug-induced myopathy 12/03/2024   Septic shock (HCC) 11/18/2024   Chronic diastolic heart failure (HCC) 11/18/2024   History of lung cancer 11/18/2024   GERD (gastroesophageal reflux disease) 11/18/2024   Hypokalemia 11/18/2024   Type 2 diabetes mellitus with complication, without long-term current use of insulin  (HCC) 11/18/2024   BPH (benign prostatic hyperplasia) 11/18/2024   Other chest pain 11/04/2024   Sepsis due to pneumonia (HCC) 10/01/2024   Chronic alcohol abuse 10/01/2024   Odynophagia 10/01/2024   Hyponatremia 10/01/2024   FTT (failure to thrive) in adult 10/01/2024   SOB (shortness of breath) 09/17/2024   COPD with acute exacerbation (HCC) 09/17/2024   Encounter for smoking cessation counseling 09/17/2024   Tobacco dependence 09/17/2024   Type 2 diabetes mellitus with diabetic neuropathy, with long-term current use of insulin  (HCC) 08/07/2024   Transaminitis 04/11/2024   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   Fever 03/31/2024   Hypotension 03/31/2024   Dehydration 03/31/2024   Weakness generalized 03/31/2024   Generalized weakness 03/31/2024   Malignant neoplasm of upper lobe of right lung (HCC) 12/01/2023   Positive colorectal cancer screening using Cologuard test 10/01/2021   Nonspecific abnormal electrocardiogram (ECG) (EKG) 06/23/2020   Primary hypertension 05/05/2020   Mixed hyperlipidemia 05/05/2020   Hyperuricemia 05/05/2020   Morbid obesity due to excess calories (HCC) 09/23/2019   Gout, chronic 07/01/2016   Incomplete rotator cuff tear 09/07/2015   Essential hypertension, benign 09/07/2015   COPD (chronic obstructive  pulmonary disease) (HCC) 03/29/2015   Cigarette smoker 03/24/2015       Review of Systems  Constitutional:  Negative for diaphoresis.  Eyes:  Negative for pain.  Respiratory:  Negative for shortness of breath.   Cardiovascular:  Negative for chest pain, palpitations and leg swelling.  Gastrointestinal:  Negative for abdominal pain.  Endocrine: Negative for polydipsia.  Skin:  Negative for rash.  Neurological:  Negative for dizziness, weakness and headaches.  Hematological:  Does not bruise/bleed easily.  All other systems reviewed and are negative.      Objective:   Physical Exam Constitutional:      Appearance: Normal appearance.  Cardiovascular:     Rate and Rhythm: Normal rate and regular rhythm.     Heart sounds: Normal heart sounds.  Pulmonary:     Effort: Pulmonary effort is normal.     Breath sounds: Normal breath sounds.  Skin:    General: Skin is warm.     Comments: Peeling callus left foot- no erythema currently but was red this morning  Neurological:     General: No focal deficit present.     Mental Status: He is alert and oriented to person, place, and time.  Psychiatric:        Mood and Affect: Mood normal.        Behavior: Behavior normal.     BP 99/65   Pulse 97   Temp 98 F (36.7 C) (Temporal)   Ht 5' 10 (1.778 m)   Wt 222 lb (100.7 kg)   SpO2 95%   BMI 31.85 kg/m  Assessment & Plan:   Juan Merritt in today with chief complaint of No chief complaint on file.   1. 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 Arie   Meds ordered this encounter  Medications   sulfamethoxazole -trimethoprim  (BACTRIM  DS) 800-160 MG tablet    Sig: Take 1 tablet by mouth 2 (two) times daily.    Dispense:  20 tablet    Refill:  0    Supervising Provider:   MARYANNE CHEW A [[id number]]       The above assessment and management  plan was discussed with the patient. The patient verbalized understanding of and has agreed to the management plan. Patient is aware to call the clinic if symptoms persist or worsen. Patient is aware when to return to the clinic for a follow-up visit. Patient educated on when it is appropriate to go to the emergency department.   Mary-Margaret Gladis, FNP   "

## 2025-01-21 NOTE — Telephone Encounter (Signed)
 Apt scheduled.

## 2025-01-22 ENCOUNTER — Ambulatory Visit: Admitting: Family Medicine

## 2025-01-22 ENCOUNTER — Ambulatory Visit: Admitting: Student

## 2025-01-22 ENCOUNTER — Ambulatory Visit: Payer: Self-pay | Admitting: Physical Therapy

## 2025-01-23 ENCOUNTER — Encounter: Payer: Self-pay | Admitting: Physical Therapy

## 2025-01-23 ENCOUNTER — Ambulatory Visit: Payer: Self-pay | Admitting: Physical Therapy

## 2025-01-23 DIAGNOSIS — M6281 Muscle weakness (generalized): Secondary | ICD-10-CM

## 2025-01-23 NOTE — Therapy (Signed)
 " OUTPATIENT PHYSICAL THERAPY NEURO EVALUATION   Patient Name: Juan Merritt MRN: [id number] DOB:[birth year], 74 y.o., male Today's Date: 01/23/2025  REFERRING PROVIDER: Lauraine Louder ANP.  END OF SESSION:  PT End of Session - 01/23/25 1127     Visit Number 1    Number of Visits 12    Date for Recertification  03/06/25    PT Start Time 1102    PT Stop Time 1127    PT Time Calculation (min) 25 min    Activity Tolerance Patient tolerated treatment well    Behavior During Therapy WFL for tasks assessed/performed          Past Medical History:  Diagnosis Date   Arthritis    BPH associated with nocturia    Cancer (HCC)    stage III non-small cell lung cancer.   Chronic gout    COPD (chronic obstructive pulmonary disease) (HCC)    not on home o2   History of colon polyps    History of COVID-19    04/ 2019 hospital admission in epic, severe sepsis due to covid w/ acute respiratory failure;   and 05/ 2021 mild to moderate symptoms, recovered at home   Hyperlipidemia, mixed    Hypertension    Phimosis    Type 2 diabetes mellitus (HCC)    followed by pcp   (05-17-2022  pt stated checks sugar multiple times daily w/ Libre,  fasting average --- 100-110)   Past Surgical History:  Procedure Laterality Date   BRONCHIAL BIOPSY  01/22/2024   Procedure: BRONCHIAL BIOPSIES;  Surgeon: Shelah Lamar RAMAN, MD;  Location: [facility] ENDOSCOPY;  Service: Pulmonary;;   BRONCHIAL BRUSHINGS  01/22/2024   Procedure: BRONCHIAL BRUSHINGS;  Surgeon: Shelah Lamar RAMAN, MD;  Location: [facility] ENDOSCOPY;  Service: Pulmonary;;   BRONCHIAL NEEDLE ASPIRATION BIOPSY  01/22/2024   Procedure: BRONCHIAL NEEDLE ASPIRATION BIOPSIES;  Surgeon: Shelah Lamar RAMAN, MD;  Location: [facility] ENDOSCOPY;  Service: Pulmonary;;   CIRCUMCISION N/A 05/19/2022   Procedure: CIRCUMCISION ADULT;  Surgeon: Matilda Senior, MD;  Location: [facility];  Service: Urology;  Laterality: N/A;  45 MINUTES   COLONOSCOPY  12/01/2021   by dr  legrand   ENDOBRONCHIAL ULTRASOUND  01/22/2024   Procedure: ENDOBRONCHIAL ULTRASOUND;  Surgeon: Shelah Lamar RAMAN, MD;  Location: [facility] ENDOSCOPY;  Service: Pulmonary;;   ESOPHAGEAL DILATION N/A 10/04/2024   Procedure: DILATION, ESOPHAGUS;  Surgeon: Eartha Flavors, Toribio, MD;  Location: [facility] ENDO SUITE;  Service: Gastroenterology;  Laterality: N/A;   ESOPHAGOGASTRODUODENOSCOPY N/A 10/04/2024   Procedure: EGD (ESOPHAGOGASTRODUODENOSCOPY);  Surgeon: Eartha Flavors, Toribio, MD;  Location: [facility] ENDO SUITE;  Service: Gastroenterology;  Laterality: N/A;   FINE NEEDLE ASPIRATION  01/22/2024   Procedure: FINE NEEDLE ASPIRATION (FNA) LINEAR;  Surgeon: Shelah Lamar RAMAN, MD;  Location: [facility] ENDOSCOPY;  Service: Pulmonary;;   LAPAROSCOPIC CHOLECYSTECTOMY  2008   in [place]   LEFT HEART CATH     TONSILLECTOMY     child   VIDEO BRONCHOSCOPY WITH RADIAL ENDOBRONCHIAL ULTRASOUND  01/22/2024   Procedure: VIDEO BRONCHOSCOPY WITH RADIAL ENDOBRONCHIAL ULTRASOUND;  Surgeon: Shelah Lamar RAMAN, MD;  Location: [facility] ENDOSCOPY;  Service: Pulmonary;;   Patient Active Problem List   Diagnosis Date Noted   Drug-induced myopathy 12/03/2024   Septic shock (HCC) 11/18/2024   Chronic diastolic heart failure (HCC) 11/18/2024   History of lung cancer 11/18/2024   GERD (gastroesophageal reflux disease) 11/18/2024   Hypokalemia 11/18/2024   Type 2 diabetes mellitus with complication, without long-term current use of  insulin  (HCC) 11/18/2024   BPH (benign prostatic hyperplasia) 11/18/2024   Other chest pain 11/04/2024   Sepsis due to pneumonia (HCC) 10/01/2024   Chronic alcohol abuse 10/01/2024   Odynophagia 10/01/2024   Hyponatremia 10/01/2024   FTT (failure to thrive) in adult 10/01/2024   SOB (shortness of breath) 09/17/2024   COPD with acute exacerbation (HCC) 09/17/2024   Encounter for smoking cessation counseling 09/17/2024   Tobacco dependence 09/17/2024   Type 2 diabetes mellitus with diabetic neuropathy, with long-term  current use of insulin  (HCC) 08/07/2024   Transaminitis 04/11/2024   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   Fever 03/31/2024   Hypotension 03/31/2024   Dehydration 03/31/2024   Weakness generalized 03/31/2024   Generalized weakness 03/31/2024   Malignant neoplasm of upper lobe of right lung (HCC) 12/01/2023   Positive colorectal cancer screening using Cologuard test 10/01/2021   Nonspecific abnormal electrocardiogram (ECG) (EKG) 06/23/2020   Primary hypertension 05/05/2020   Mixed hyperlipidemia 05/05/2020   Hyperuricemia 05/05/2020   Morbid obesity due to excess calories (HCC) 09/23/2019   Gout, chronic 07/01/2016   Incomplete rotator cuff tear 09/07/2015   Essential hypertension, benign 09/07/2015   COPD (chronic obstructive pulmonary disease) (HCC) 03/29/2015   Cigarette smoker 03/24/2015    ONSET DATE: Ongoing.    REFERRING DIAG: Lauraine Louder ANP.  THERAPY DIAG:  Muscle weakness (generalized)  Rationale for Evaluation and Treatment: Rehabilitation  SUBJECTIVE:                                                                                                                                                                                             SUBJECTIVE STATEMENT: The patient presents to the clinic with c/o generalized weakness.  He reports no recent falls but it was strongly recommended that he use a cane for additional safety.  He states he would like to get stronger and walk for longer periods of time.  02 sat is 92%  PERTINENT HISTORY: Treatments for lung cancer.  Hospitalized recently, per patient, for 6 days.    PAIN:  Are you having pain? No  PRECAUTIONS: Other: Please supervise gait.  Monitor 02 sat.  RED FLAGS: None   WEIGHT BEARING RESTRICTIONS: No  FALLS: Has patient fallen in last 6 months? No.    LIVING ENVIRONMENT: Lives with: lives with their spouse Lives in: House/apartment Stairs: Ramp. Has following equipment at home: None.  Cane recommended.     PLOF: Independent  PATIENT GOALS: Walk longer before tiring.    OBJECTIVE:    POSTURE: rounded shoulders and forward head  LOWER EXTREMITY ROM:     WFL.  Right tibial rotation noted.  LOWER EXTREMITY MMT:    Bilateral hip abduction strength is 4+/5, bilateral hip flexion to 4/5, knee ext= 4+/5 and bil ankle dorsiflexion is 4+/5.   TRANSFERS: Sit to supine, supine to sit is ind.    GAIT: Patient walks with a wide BOS and increased toe out with a decrease in step and stride length and decreased dorsiflexion.    FUNCTIONAL TESTS:  5 times sit to stand: 15 seconds Timed up and go (TUG): 15 seconds.                                                                                                                                TREATMENT DATE:     PATIENT EDUCATION: Education details: Discussed using a cane for additional safety. Person educated: Patient Education method: Medical Illustrator Education comprehension: verbalized understanding  GOALS:   SHORT TERM GOALS: Target date: 02/06/25  Ind with an initial HEP. Goal status: INITIAL   LONG TERM GOALS: Target date: 03/06/25  Ind with an advanced HEP. Goal status: INITIAL  2.  Patient walk 750 feet without stopping for rest.  Goal status: INITIAL  3.  Improve 5 time sit to stand test by 3 seconds.  Goal status: INITIAL  4.  Improve TUG by 3 seconds.  Goal status: INITIAL   ASSESSMENT:  CLINICAL IMPRESSION: The patient presents to OPPT with c/o a lack of endurance and generalized weakness.  His gait pattern is remarkable for a wide BOS and increased toe out with a decrease in step and stride length and decreased dorsiflexion.  Recommended he use a cane for additional safety.  His TUG and 5 time sit to stand tests were performed in 15 seconds.  Patient will benefit from skilled PT intervention to address deficits.    OBJECTIVE IMPAIRMENTS: Abnormal gait, decreased activity tolerance, decreased  endurance, and decreased strength.   ACTIVITY LIMITATIONS: carrying, lifting, bending, and locomotion level  PARTICIPATION LIMITATIONS: meal prep, cleaning, laundry, shopping, community activity, and yard work  PERSONAL FACTORS: Time since onset of injury/illness/exacerbation and 1 comorbidity: lung CA are also affecting patient's functional outcome.   REHAB POTENTIAL: Good-/Good  CLINICAL DECISION MAKING: Evolving/moderate complexity  EVALUATION COMPLEXITY: Moderate  PLAN:  PT FREQUENCY: 2x/week  PT DURATION: 6 weeks  PLANNED INTERVENTIONS: 97110-Therapeutic exercises, 97530- Therapeutic activity, [id number]- Neuromuscular re-education, 97535- Self Care, [id number]- Manual therapy, and Patient/Family education  PLAN FOR NEXT SESSION: Nustep, strengthening, gait and balance activities.     Imad Shostak, PT 01/23/2025, 11:58 AM        "

## 2025-01-28 ENCOUNTER — Ambulatory Visit

## 2025-01-30 ENCOUNTER — Ambulatory Visit: Admitting: Physical Therapy

## 2025-04-04 ENCOUNTER — Ambulatory Visit: Admitting: Student

## 2025-08-26 ENCOUNTER — Ambulatory Visit: Payer: Self-pay
